# Patient Record
Sex: Male | Born: 1980 | Race: Black or African American | Hispanic: No | Marital: Married | Smoking: Former smoker
Health system: Southern US, Community
[De-identification: ages and names within clinical notes are randomized; demographics above are authoritative.]

## PROBLEM LIST (undated history)

## (undated) DIAGNOSIS — I10 Essential (primary) hypertension: Secondary | ICD-10-CM

## (undated) DIAGNOSIS — E785 Hyperlipidemia, unspecified: Secondary | ICD-10-CM

## (undated) DIAGNOSIS — E119 Type 2 diabetes mellitus without complications: Secondary | ICD-10-CM

## (undated) DIAGNOSIS — K859 Acute pancreatitis without necrosis or infection, unspecified: Secondary | ICD-10-CM

## (undated) HISTORY — PX: EYE SURGERY: SHX253

## (undated) HISTORY — PX: NO PAST SURGERIES: SHX2092

## (undated) HISTORY — DX: Hyperlipidemia, unspecified: E78.5

---

## 2010-06-24 ENCOUNTER — Emergency Department (HOSPITAL_COMMUNITY)
Admission: EM | Admit: 2010-06-24 | Discharge: 2010-06-24 | Disposition: A | Discharge status: Home / Self Care | Payer: Self-pay | Attending: Emergency Medicine | Admitting: Emergency Medicine

## 2010-06-24 DIAGNOSIS — R350 Frequency of micturition: Secondary | ICD-10-CM | POA: Insufficient documentation

## 2010-06-24 DIAGNOSIS — R3589 Other polyuria: Secondary | ICD-10-CM | POA: Insufficient documentation

## 2010-06-24 DIAGNOSIS — R631 Polydipsia: Secondary | ICD-10-CM | POA: Insufficient documentation

## 2010-06-24 DIAGNOSIS — R7309 Other abnormal glucose: Secondary | ICD-10-CM | POA: Insufficient documentation

## 2010-06-24 DIAGNOSIS — R358 Other polyuria: Secondary | ICD-10-CM | POA: Insufficient documentation

## 2010-06-24 LAB — GLUCOSE, CAPILLARY: Glucose-Capillary: 499 mg/dL — ABNORMAL HIGH (ref 70–99)

## 2010-06-24 LAB — POCT I-STAT, CHEM 8
BUN: 13 mg/dL (ref 6–23)
Calcium, Ion: 1.14 mmol/L (ref 1.12–1.32)
Creatinine, Ser: 1 mg/dL (ref 0.4–1.5)
Glucose, Bld: 422 mg/dL — ABNORMAL HIGH (ref 70–99)
Hemoglobin: 16.3 g/dL (ref 13.0–17.0)
Sodium: 134 mEq/L — ABNORMAL LOW (ref 135–145)
TCO2: 28 mmol/L (ref 0–100)

## 2010-11-25 ENCOUNTER — Emergency Department (HOSPITAL_COMMUNITY)
Admission: EM | Admit: 2010-11-25 | Discharge: 2010-11-25 | Disposition: A | Discharge status: Home / Self Care | Payer: Self-pay | Attending: Emergency Medicine | Admitting: Emergency Medicine

## 2010-11-25 DIAGNOSIS — K089 Disorder of teeth and supporting structures, unspecified: Secondary | ICD-10-CM | POA: Insufficient documentation

## 2010-11-25 DIAGNOSIS — K029 Dental caries, unspecified: Secondary | ICD-10-CM | POA: Insufficient documentation

## 2010-11-25 DIAGNOSIS — I10 Essential (primary) hypertension: Secondary | ICD-10-CM | POA: Insufficient documentation

## 2010-11-25 DIAGNOSIS — E1169 Type 2 diabetes mellitus with other specified complication: Secondary | ICD-10-CM | POA: Insufficient documentation

## 2010-11-25 LAB — BASIC METABOLIC PANEL
BUN: 15 mg/dL (ref 6–23)
CO2: 29 mEq/L (ref 19–32)
Calcium: 9.2 mg/dL (ref 8.4–10.5)
Chloride: 95 mEq/L — ABNORMAL LOW (ref 96–112)
Creatinine, Ser: 0.71 mg/dL (ref 0.50–1.35)
Glucose, Bld: 381 mg/dL — ABNORMAL HIGH (ref 70–99)

## 2011-08-22 ENCOUNTER — Encounter (HOSPITAL_COMMUNITY): Payer: Self-pay | Admitting: *Deleted

## 2011-08-22 ENCOUNTER — Emergency Department (INDEPENDENT_AMBULATORY_CARE_PROVIDER_SITE_OTHER)
Admission: EM | Admit: 2011-08-22 | Discharge: 2011-08-22 | Disposition: A | Discharge status: Home / Self Care | Payer: Self-pay | Source: Home / Self Care | Attending: Emergency Medicine | Admitting: Emergency Medicine

## 2011-08-22 DIAGNOSIS — I10 Essential (primary) hypertension: Secondary | ICD-10-CM

## 2011-08-22 DIAGNOSIS — K056 Periodontal disease, unspecified: Secondary | ICD-10-CM

## 2011-08-22 DIAGNOSIS — E119 Type 2 diabetes mellitus without complications: Secondary | ICD-10-CM

## 2011-08-22 DIAGNOSIS — E109 Type 1 diabetes mellitus without complications: Secondary | ICD-10-CM

## 2011-08-22 DIAGNOSIS — K068 Other specified disorders of gingiva and edentulous alveolar ridge: Secondary | ICD-10-CM

## 2011-08-22 HISTORY — DX: Essential (primary) hypertension: I10

## 2011-08-22 MED ORDER — ENALAPRIL MALEATE 20 MG PO TABS: 10.0000 mg | ORAL_TABLET | Freq: Every day | ORAL | Status: DC

## 2011-08-22 MED ORDER — METFORMIN HCL 500 MG PO TABS: 500.0000 mg | ORAL_TABLET | Freq: Two times a day (BID) | ORAL | Status: DC

## 2011-08-22 MED ORDER — CHLORHEXIDINE GLUCONATE 0.12 % MT SOLN: 15.0000 mL | Freq: Two times a day (BID) | OROMUCOSAL | Status: DC

## 2011-08-22 NOTE — Discharge Instructions (Signed)
Dental Care and Dentist Visits Dental care supports good overall health. Regular dental visits can also help you avoid dental pain, bleeding, infection, and other more serious health problems in the future. It is important to keep the mouth healthy because diseases in the teeth, gums, and other oral tissues can spread to other areas of the body. Some problems, such as diabetes, heart disease, and pre-term labor have been associated with poor oral health.  See your dentist every 6 months. If you experience emergency problems such as a toothache or broken tooth, go to the dentist right away. If you see your dentist regularly, you may catch problems early. It is easier to be treated for problems in the early stages.  WHAT TO EXPECT AT A DENTIST VISIT  Your dentist will look for many common oral health problems and recommend proper treatment. At your regular dental visit, you can expect:  Gentle cleaning of the teeth and gums. This includes scraping and polishing. This helps to remove the sticky substance around the teeth and gums (plaque). Plaque forms in the mouth shortly after eating. Over time, plaque hardens on the teeth as tartar. If tartar is not removed regularly, it can cause problems. Cleaning also helps remove stains.   Periodic X-rays. These pictures of the teeth and supporting bone will help your dentist assess the health of your teeth.   Periodic fluoride treatments. Fluoride is a natural mineral shown to help strengthen teeth. Fluoride treatmentinvolves applying a fluoride gel or varnish to the teeth. It is most commonly done in children.   Examination of the mouth, tongue, jaws, teeth, and gums to look for any oral health problems, such as:   Cavities (dental caries). This is decay on the tooth caused by plaque, sugar, and acid in the mouth. It is best to catch a cavity when it is small.   Inflammation of the gums caused by plaque buildup (gingivitis).   Problems with the mouth or  malformed or misaligned teeth.   Oral cancer or other diseases of the soft tissues or jaws.  KEEP YOUR TEETH AND GUMS HEALTHY For healthy teeth and gums, follow these general guidelines as well as your dentist's specific advice:  Have your teeth professionally cleaned at the dentist every 6 months.   Brush twice daily with a fluoride toothpaste.   Floss your teeth daily.   Ask your dentist if you need fluoride supplements, treatments, or fluoride toothpaste.   Eat a healthy diet. Reduce foods and drinks with added sugar.   Avoid smoking.  TREATMENT FOR ORAL HEALTH PROBLEMS If you have oral health problems, treatment varies depending on the conditions present in your teeth and gums.  Your caregiver will most likely recommend good oral hygiene at each visit.   For cavities, gingivitis, or other oral health disease, your caregiver will perform a procedure to treat the problem. This is typically done at a separate appointment. Sometimes your caregiver will refer you to another dental specialist for specific tooth problems or for surgery.  SEEK IMMEDIATE DENTAL CARE IF:  You have pain, bleeding, or soreness in the gum, tooth, jaw, or mouth area.   A permanent tooth becomes loose or separated from the gum socket.   You experience a blow or injury to the mouth or jaw area.  Document Released: 01/09/2011 Document Revised: 04/18/2011 Document Reviewed: 01/09/2011 [facility] Patient Information 2012 [place], [place].Blood Sugar Monitoring, Adult GLUCOSE METERS FOR SELF-MONITORING OF BLOOD GLUCOSE  It is important to be able to correctly  measure your blood sugar (glucose). You can use a blood glucose monitor (a small battery-operated device) to check your glucose level at any time. This allows you and your caregiver to monitor your diabetes and to determine how well your treatment plan is working. The process of monitoring your blood glucose with a glucose meter is called self-monitoring of  blood glucose (SMBG). When people with diabetes control their blood sugar, they have better health. To test for glucose with a typical glucose meter, place the disposable strip in the meter. Then place a small sample of blood on the "test strip." The test strip is coated with chemicals that combine with glucose in blood. The meter measures how much glucose is present. The meter displays the glucose level as a number. Several new models can record and store a number of test results. Some models can connect to personal computers to store test results or print them out.  Newer meters are often easier to use than older models. Some meters allow you to get blood from places other than your fingertip. Some new models have automatic timing, error codes, signals, or barcode readers to help with proper adjustment (calibration). Some meters have a large display screen or spoken instructions for people with visual impairments.  INSTRUCTIONS FOR USING GLUCOSE METERS  Wash your hands with soap and warm water, or clean the area with alcohol. Dry your hands completely.   Prick the side of your fingertip with a lancet (a sharp-pointed tool used by hand).   Hold the hand down and gently milk the finger until a small drop of blood appears. Catch the blood with the test strip.   Follow the instructions for inserting the test strip and using the SMBG meter. Most meters require the meter to be turned on and the test strip to be inserted before applying the blood sample.   Record the test result.   Read the instructions carefully for both the meter and the test strips that go with it. Meter instructions are found in the user manual. Keep this manual to help you solve any problems that may arise. Many meters use "error codes" when there is a problem with the meter, the test strip, or the blood sample on the strip. You will need the manual to understand these error codes and fix the problem.   New devices are available  such as laser lancets and meters that can test blood taken from "alternative sites" of the body, other than fingertips. However, you should use standard fingertip testing if your glucose changes rapidly. Also, use standard testing if:   You have eaten, exercised, or taken insulin in the past 2 hours.   You think your glucose is low.   You tend to not feel symptoms of low blood glucose (hypoglycemia).   You are ill or under stress.   Clean the meter as directed by the manufacturer.   Test the meter for accuracy as directed by the manufacturer.   Take your meter with you to your caregiver's office. This way, you can test your glucose in front of your caregiver to make sure you are using the meter correctly. Your caregiver can also take a sample of blood to test using a routine lab method. If values on the glucose meter are close to the lab results, you and your caregiver will see that your meter is working well and you are using good technique. Your caregiver will advise you about what to do if the results do  not match.  FREQUENCY OF TESTING  Your caregiver will tell you how often you should check your blood glucose. This will depend on your type of diabetes, your current level of diabetes control, and your types of medicines. The following are general guidelines, but your care plan may be different. Record all your readings and the time of day you took them for review with your caregiver.   Diabetes type 1.   When you are using insulin with good diabetic control (either multiple daily injections or via a pump), you should check your glucose 4 times a day.   If your diabetes is not well controlled, you may need to monitor more frequently, including before meals and 2 hours after meals, at bedtime, and occasionally between 2 a.m. and 3 a.m.   You should always check your glucose before a dose of insulin or before changing the rate on your insulin pump.   Diabetes type 2.   Guidelines for  SMBG in diabetes type 2 are not as well defined.   If you are on insulin, follow the guidelines above.   If you are on medicines, but not insulin, and your glucose is not well controlled, you should test at least twice daily.   If you are not on insulin, and your diabetes is controlled with medicines or diet alone, you should test at least once daily, usually before breakfast.   A weekly profile will help your caregiver advise you on your care plan. The week before your visit, check your glucose before a meal and 2 hours after a meal at least daily. You may want to test before and after a different meal each day so you and your caregiver can tell how well controlled your blood sugars are throughout the course of a 24 hour period.   Gestational diabetes (diabetes during pregnancy).   Frequent testing is often necessary. Accurate timing is important.   If you are not on insulin, check your glucose 4 times a day. Check it before breakfast and 1 hour after the start of each meal.   If you are on insulin, check your glucose 6 times a day. Check it before each meal and 1 hour after the first bite of each meal.   General guidelines.   More frequent testing is required at the start of insulin treatment. Your caregiver will instruct you.   Test your glucose any time you suspect you have low blood sugar (hypoglycemia).   You should test more often when you change medicines, when you have unusual stress or illness, or in other unusual circumstances.  OTHER THINGS TO KNOW ABOUT GLUCOSE METERS  Measurement Range. Most glucose meters are able to read glucose levels over a broad range of values from as low as 0 to as high as 600 mg/dL. If you get an extremely high or low reading from your meter, you should first confirm it with another reading. Report very high or very low readings to your caregiver.   Whole Blood Glucose versus Plasma Glucose. Some older home glucose meters measure glucose in your  whole blood. In a lab or when using some newer home glucose meters, the glucose is measured in your plasma (one component of blood). The difference can be important. It is important for you and your caregiver to know whether your meter gives its results as "whole blood equivalent" or "plasma equivalent."   Display of High and Low Glucose Values. Part of learning how to operate a meter is  understanding what the meter results mean. Know how high and low glucose concentrations are displayed on your meter.   Factors that Affect Glucose Meter Performance. The accuracy of your test results depends on many factors and varies depending on the brand and type of meter. These factors include:   Low red blood cell count (anemia).   Substances in your blood (such as uric acid, vitamin C, and others).   Environmental factors (temperature, humidity, altitude).   Name-brand versus generic test strips.   Calibration. Make sure your meter is set up properly. It is a good idea to do a calibration test with a control solution recommended by the manufacturer of your meter whenever you begin using a fresh bottle of test strips. This will help verify the accuracy of your meter.   Improperly stored, expired, or defective test strips. Keep your strips in a dry place with the lid on.   Soiled meter.   Inadequate blood sample.  NEW TECHNOLOGIES FOR GLUCOSE TESTING Alternative site testing Some glucose meters allow testing blood from alternative sites. These include the:  Upper arm.   Forearm.   Base of the thumb.   Thigh.  Sampling blood from alternative sites may be desirable. However, it may have some limitations. Blood in the fingertips show changes in glucose levels more quickly than blood in other parts of the body. This means that alternative site test results may be different from fingertip test results, not because of the meter's ability to test accurately, but because the actual glucose concentration  can be different.  Continuous Glucose Monitoring Devices to measure your blood glucose continuously are available, and others are in development. These methods can be more expensive than self-monitoring with a glucose meter. However, it is uncertain how effective and reliable these devices are. Your caregiver will advise you if this approach makes sense for you. IF BLOOD SUGARS ARE CONTROLLED, PEOPLE WITH DIABETES REMAIN HEALTHIER.  SMBG is an important part of the treatment plan of patients with diabetes mellitus. Below are reasons for using SMBG:   It confirms that your glucose is at a specific, healthy level.   It detects hypoglycemia and severe hyperglycemia.   It allows you and your caregiver to make adjustments in response to changes in lifestyle for individuals requiring medicine.   It determines the need for starting insulin therapy in temporary diabetes that happens during pregnancy (gestational diabetes).  Document Released: 05/02/2003 Document Revised: 04/18/2011 Document Reviewed: 08/23/2010 [facility] Patient Information 2012 [place], [place].Arterial Hypertension Arterial hypertension (high blood pressure) is a condition of elevated pressure in your blood vessels. Hypertension over a long period of time is a risk factor for strokes, heart attacks, and heart failure. It is also the leading cause of kidney (renal) failure.  CAUSES   In Adults -- Over 90% of all hypertension has no known cause. This is called essential or primary hypertension. In the other 10% of people with hypertension, the increase in blood pressure is caused by another disorder. This is called secondary hypertension. Important causes of secondary hypertension are:   Heavy alcohol use.   Obstructive sleep apnea.   Hyperaldosterosim (Conn's syndrome).   Steroid use.   Chronic kidney failure.   Hyperparathyroidism.   Medications.   Renal artery stenosis.   Pheochromocytoma.   Cushing's disease.    Coarctation of the aorta.   Scleroderma renal crisis.   Licorice (in excessive amounts).   Drugs (cocaine, methamphetamine).  Your caregiver can explain any items above that apply  to you.  In Children -- Secondary hypertension is more common and should always be considered.   Pregnancy -- Few women of childbearing age have high blood pressure. However, up to 10% of them develop hypertension of pregnancy. Generally, this will not harm the woman. It may be a sign of 3 complications of pregnancy: preeclampsia, HELLP syndrome, and eclampsia. Follow up and control with medication is necessary.  SYMPTOMS   This condition normally does not produce any noticeable symptoms. It is usually found during a routine exam.   Malignant hypertension is a late problem of high blood pressure. It may have the following symptoms:   Headaches.   Blurred vision.   End-organ damage (this means your kidneys, heart, lungs, and other organs are being damaged).   Stressful situations can increase the blood pressure. If a person with normal blood pressure has their blood pressure go up while being seen by their caregiver, this is often termed "white coat hypertension." Its importance is not known. It may be related with eventually developing hypertension or complications of hypertension.   Hypertension is often confused with mental tension, stress, and anxiety.  DIAGNOSIS  The diagnosis is made by 3 separate blood pressure measurements. They are taken at least 1 week apart from each other. If there is organ damage from hypertension, the diagnosis may be made without repeat measurements. Hypertension is usually identified by having blood pressure readings:  Above 140/90 mmHg measured in both arms, at 3 separate times, over a couple weeks.   Over 130/80 mmHg should be considered a risk factor and may require treatment in patients with diabetes.  Blood pressure readings over 120/80 mmHg are called  "pre-hypertension" even in non-diabetic patients. To get a true blood pressure measurement, use the following guidelines. Be aware of the factors that can alter blood pressure readings.  Take measurements at least 1 hour after caffeine.   Take measurements 30 minutes after smoking and without any stress. This is another reason to quit smoking - it raises your blood pressure.   Use a proper cuff size. Ask your caregiver if you are not sure about your cuff size.   Most home blood pressure cuffs are automatic. They will measure systolic and diastolic pressures. The systolic pressure is the pressure reading at the start of sounds. Diastolic pressure is the pressure at which the sounds disappear. If you are elderly, measure pressures in multiple postures. Try sitting, lying or standing.   Sit at rest for a minimum of 5 minutes before taking measurements.   You should not be on any medications like decongestants. These are found in many cold medications.   Record your blood pressure readings and review them with your caregiver.  If you have hypertension:  Your caregiver may do tests to be sure you do not have secondary hypertension (see "causes" above).   Your caregiver may also look for signs of metabolic syndrome. This is also called Syndrome X or Insulin Resistance Syndrome. You may have this syndrome if you have type 2 diabetes, abdominal obesity, and abnormal blood lipids in addition to hypertension.   Your caregiver will take your medical and family history and perform a physical exam.   Diagnostic tests may include blood tests (for glucose, cholesterol, potassium, and kidney function), a urinalysis, or an EKG. Other tests may also be necessary depending on your condition.  PREVENTION  There are important lifestyle issues that you can adopt to reduce your chance of developing hypertension:  Maintain a normal  weight.   Limit the amount of salt (sodium) in your diet.   Exercise often.     Limit alcohol intake.   Get enough potassium in your diet. Discuss specific advice with your caregiver.   Follow a DASH diet (dietary approaches to stop hypertension). This diet is rich in fruits, vegetables, and low-fat dairy products, and avoids certain fats.  PROGNOSIS  Essential hypertension cannot be cured. Lifestyle changes and medical treatment can lower blood pressure and reduce complications. The prognosis of secondary hypertension depends on the underlying cause. Many people whose hypertension is controlled with medicine or lifestyle changes can live a normal, healthy life.  RISKS AND COMPLICATIONS  While high blood pressure alone is not an illness, it often requires treatment due to its short- and long-term effects on many organs. Hypertension increases your risk for:  CVAs or strokes (cerebrovascular accident).   Heart failure due to chronically high blood pressure (hypertensive cardiomyopathy).   Heart attack (myocardial infarction).   Damage to the retina (hypertensive retinopathy).   Kidney failure (hypertensive nephropathy).  Your caregiver can explain list items above that apply to you. Treatment of hypertension can significantly reduce the risk of complications. TREATMENT   For overweight patients, weight loss and regular exercise are recommended. Physical fitness lowers blood pressure.   Mild hypertension is usually treated with diet and exercise. A diet rich in fruits and vegetables, fat-free dairy products, and foods low in fat and salt (sodium) can help lower blood pressure. Decreasing salt intake decreases blood pressure in a 1/3 of people.   Stop smoking if you are a smoker.  The steps above are highly effective in reducing blood pressure. While these actions are easy to suggest, they are difficult to achieve. Most patients with moderate or severe hypertension end up requiring medications to bring their blood pressure down to a normal level. There are several  classes of medications for treatment. Blood pressure pills (antihypertensives) will lower blood pressure by their different actions. Lowering the blood pressure by 10 mmHg may decrease the risk of complications by as much as 25%. The goal of treatment is effective blood pressure control. This will reduce your risk for complications. Your caregiver will help you determine the best treatment for you according to your lifestyle. What is excellent treatment for one person, may not be for you. HOME CARE INSTRUCTIONS   Do not smoke.   Follow the lifestyle changes outlined in the "Prevention" section.   If you are on medications, follow the directions carefully. Blood pressure medications must be taken as prescribed. Skipping doses reduces their benefit. It also puts you at risk for problems.   Follow up with your caregiver, as directed.   If you are asked to monitor your blood pressure at home, follow the guidelines in the "Diagnosis" section above.  SEEK MEDICAL CARE IF:   You think you are having medication side effects.   You have recurrent headaches or lightheadedness.   You have swelling in your ankles.   You have trouble with your vision.  SEEK IMMEDIATE MEDICAL CARE IF:   You have sudden onset of chest pain or pressure, difficulty breathing, or other symptoms of a heart attack.   You have a severe headache.   You have symptoms of a stroke (such as sudden weakness, difficulty speaking, difficulty walking).  MAKE SURE YOU:   Understand these instructions.   Will watch your condition.   Will get help right away if you are not doing well or get  worse.  Document Released: 04/29/2005 Document Revised: 04/18/2011 Document Reviewed: 11/27/2006 [facility] Patient Information 2012 [place], [place].

## 2011-08-22 NOTE — ED Notes (Addendum)
C/O intermittent upper and lower pains to gum area x 3-4 months with worsening over pas 2 wks.  Has been taking Tylenol.  Also reports need medicine for diabetes and HTN (has been out of all meds x several months); does not check CBGs at home.  Does not have PCP - states he has to come [facility] for refills due to no insurance.  Is supposed to be taking metformin 500mg  bid and ?atenolol.

## 2011-08-22 NOTE — ED Provider Notes (Signed)
History     CSN: [id number]  Arrival date & time 08/22/11  1717   First MD Initiated Contact with Patient 08/22/11 1719      Chief Complaint  Patient presents with  . Dental Pain  . Hyperglycemia  . Hypertension    (Consider location/radiation/quality/duration/timing/severity/associated sxs/prior treatment) HPI Comments: Patient presents urgent care tonight complaining of ongoing upper and lower pains on gum area been happening for 2-3 months but during the last 2 weeks have felt it had gotten progressively worse been taking some Tylenol for. Patient also describes that he's been off medicines for several months for both his high blood pressure and diabetes. Have come back to obtain more refills as he has no primary care doctor is suggested as he states that he has no insurance.  Patient is a 31 y.o. male presenting with tooth pain and hypertension. The history is provided by the patient and a relative.  Dental PainThe primary symptoms include mouth pain and oral lesions. Primary symptoms do not include dental injury, headaches or fever. The symptoms began more than 1 week ago. The symptoms are unchanged. The symptoms occur constantly.  Additional symptoms include: gum swelling, jaw pain, trouble swallowing and dry mouth. Additional symptoms do not include: facial swelling, pain with swallowing, excessive salivation, taste disturbance, ear pain and hearing loss. Medical issues include: periodontal disease.   Hypertension Pertinent negatives include no abdominal pain and no headaches.    Past Medical History  Diagnosis Date  . Diabetes mellitus   . Hypertension     History reviewed. No pertinent past surgical history.  History reviewed. No pertinent family history.  History  Substance Use Topics  . Smoking status: Current Everyday Smoker  . Smokeless tobacco: Not on file  . Alcohol Use:       Review of Systems  Constitutional: Negative for fever, chills and appetite  change.  HENT: Positive for trouble swallowing. Negative for hearing loss, ear pain and facial swelling.   Gastrointestinal: Negative for abdominal pain and rectal pain.  Skin: Negative for rash.  Neurological: Negative for dizziness and headaches.    Allergies  Review of patient's allergies indicates no known allergies.  Home Medications   Current Outpatient Rx  Name Route Sig Dispense Refill  . CHLORHEXIDINE GLUCONATE 0.12 % MT SOLN Mouth/Throat Use as directed 15 mLs in the mouth or throat 2 (two) times daily. X 1 week 120 mL 0  . ENALAPRIL MALEATE 20 MG PO TABS Oral Take 0.5 tablets (10 mg total) by mouth daily. 30 tablet 2  . METFORMIN HCL 500 MG PO TABS Oral Take 1 tablet (500 mg total) by mouth 2 (two) times daily with a meal. 60 tablet 1    BP 149/100  Pulse 74  Temp(Src) 99.1 F (37.3 C) (Oral)  Resp 18  SpO2 100%  Physical Exam  Nursing note and vitals reviewed. Constitutional: He appears well-developed.  HENT:  Head: No trismus in the jaw.  Mouth/Throat: Mucous membranes are dry. He does not have dentures. Abnormal dentition. Dental caries present. No dental abscesses or uvula swelling. No oropharyngeal exudate.    Eyes: Pupils are equal, round, and reactive to light.  Neck: Normal range of motion.    ED Course  Procedures (including critical care time)  Labs Reviewed  GLUCOSE, CAPILLARY - Abnormal; Notable for the following:    Glucose-Capillary 330 (*)    All other components within normal limits   No results found.   1. Diabetes mellitus  2. Gingival erythema   3. Hypertension       MDM  Patient presents here with ongoing dental (more gum discomfort and marked gingival disease) also requesting medicines for his blood pressure and diabetes. Patient was instructed in extent about the importance of blood pressure and diabetes controlled with a primary care Dr. instructions were given to establish with continuity of care and interview process  explained eligibility to obtain orange card. Diet modifications discussed, initiation of antihyperglycemic and antihypertensive medicine were discussed. Potential side effects of some of his medicines were also discussed.        Jimmie Molly, MD 08/22/11 [phone]

## 2011-08-29 ENCOUNTER — Encounter (HOSPITAL_COMMUNITY): Payer: Self-pay | Admitting: Emergency Medicine

## 2011-08-29 ENCOUNTER — Emergency Department (HOSPITAL_COMMUNITY)
Admission: EM | Admit: 2011-08-29 | Discharge: 2011-08-29 | Disposition: A | Discharge status: Home / Self Care | Payer: Self-pay | Attending: Emergency Medicine | Admitting: Emergency Medicine

## 2011-08-29 DIAGNOSIS — K089 Disorder of teeth and supporting structures, unspecified: Secondary | ICD-10-CM | POA: Insufficient documentation

## 2011-08-29 DIAGNOSIS — E119 Type 2 diabetes mellitus without complications: Secondary | ICD-10-CM | POA: Insufficient documentation

## 2011-08-29 DIAGNOSIS — K0889 Other specified disorders of teeth and supporting structures: Secondary | ICD-10-CM

## 2011-08-29 DIAGNOSIS — I10 Essential (primary) hypertension: Secondary | ICD-10-CM | POA: Insufficient documentation

## 2011-08-29 LAB — GLUCOSE, CAPILLARY: Glucose-Capillary: 287 mg/dL — ABNORMAL HIGH (ref 70–99)

## 2011-08-29 MED ORDER — LISINOPRIL 10 MG PO TABS
10.0000 mg | ORAL_TABLET | Freq: Once | ORAL | Status: AC
  Administered 2011-08-29: 10 mg via ORAL
  Filled 2011-08-29: qty 1

## 2011-08-29 MED ORDER — OXYCODONE-ACETAMINOPHEN 5-325 MG PO TABS: 2.0000 | ORAL_TABLET | ORAL | Status: AC | PRN

## 2011-08-29 MED ORDER — ONDANSETRON 4 MG PO TBDP
ORAL_TABLET | ORAL | Status: AC
  Administered 2011-08-29: 4 mg
  Filled 2011-08-29: qty 1

## 2011-08-29 MED ORDER — PENICILLIN V POTASSIUM 500 MG PO TABS: 500.0000 mg | ORAL_TABLET | Freq: Four times a day (QID) | ORAL | Status: AC

## 2011-08-29 MED ORDER — OXYCODONE-ACETAMINOPHEN 5-325 MG PO TABS
2.0000 | ORAL_TABLET | Freq: Once | ORAL | Status: AC
  Administered 2011-08-29: 2 via ORAL
  Filled 2011-08-29: qty 2

## 2011-08-29 NOTE — ED Provider Notes (Signed)
History     CSN: [id number]  Arrival date & time 08/29/11  0249   First MD Initiated Contact with Patient 08/29/11 [phone]      Chief Complaint  Patient presents with  . Dental Pain    (Consider location/radiation/quality/duration/timing/severity/associated sxs/prior treatment) HPI Comments: Patient reports pain in his upper and lower right molars for the past 3 days is unrelieved by Tylenol. He has a history of diabetes and hypertension and is noncompliant with his medications. He denies any difficulty breathing or swallowing. He denies any fevers, nausea, vomiting or chest pain or shortness of breath. Denies any trauma to his teeth. He was seen in urgent care last week for similar issue.  The history is provided by the patient.    Past Medical History  Diagnosis Date  . Diabetes mellitus   . Hypertension     No past surgical history on file.  No family history on file.  History  Substance Use Topics  . Smoking status: Current Everyday Smoker  . Smokeless tobacco: Not on file  . Alcohol Use: No      Review of Systems  Constitutional: Negative for fever, activity change and appetite change.  HENT: Positive for dental problem. Negative for congestion, rhinorrhea and neck pain.   Respiratory: Negative for cough, chest tightness and shortness of breath.   Cardiovascular: Negative for chest pain.  Gastrointestinal: Negative for nausea, vomiting and abdominal pain.  Genitourinary: Negative for dysuria and hematuria.  Skin: Negative for rash.  Neurological: Negative for headaches.    Allergies  Review of patient's allergies indicates no known allergies.  Home Medications   Current Outpatient Rx  Name Route Sig Dispense Refill  . ACETAMINOPHEN 500 MG PO TABS Oral Take 2,000 mg by mouth every 6 (six) hours as needed. For pain    . ENALAPRIL MALEATE 20 MG PO TABS Oral Take 0.5 tablets (10 mg total) by mouth daily. 30 tablet 2  . IBUPROFEN 200 MG PO TABS Oral Take 800 mg  by mouth every 6 (six) hours as needed. For pain    . METFORMIN HCL 500 MG PO TABS Oral Take 1 tablet (500 mg total) by mouth 2 (two) times daily with a meal. 60 tablet 1    BP 156/101  Pulse 75  Temp(Src) 98 F (36.7 C) (Oral)  Resp 19  SpO2 100%  Physical Exam  Constitutional: He is oriented to person, place, and time. He appears well-developed and well-nourished. No distress.  HENT:  Head: Normocephalic and atraumatic.  Mouth/Throat: Oropharynx is clear and moist. No oropharyngeal exudate.    Eyes: Conjunctivae and EOM are normal. Pupils are equal, round, and reactive to light.  Neck: Normal range of motion. Neck supple.  Cardiovascular: Normal rate, regular rhythm and normal heart sounds.   No murmur heard. Pulmonary/Chest: Effort normal and breath sounds normal. No respiratory distress.  Abdominal: Soft. There is no tenderness. There is no rebound and no guarding.  Musculoskeletal: Normal range of motion. He exhibits no edema and no tenderness.  Neurological: He is alert and oriented to person, place, and time. No cranial nerve deficit.  Skin: Skin is warm.    ED Course  Procedures (including critical care time)  Labs Reviewed  GLUCOSE, CAPILLARY - Abnormal; Notable for the following:    Glucose-Capillary 287 (*)    All other components within normal limits   No results found.   No diagnosis found.    MDM  Dental pain, hypertension, hyperglycemia. No evidence of abscess  or Ludwig angina.  Blood pressure improved and reassessment. Followup with dentistry. Take diabetes and hypertension medicines as prescribed. Resource guide given.       Glynn Octave, MD 08/29/11 [phone]

## 2011-08-29 NOTE — ED Notes (Signed)
Pt. Reports upper /lower molar pain radiating to right face for several days unrelieved by otc pain medications.

## 2011-08-29 NOTE — ED Notes (Signed)
CBG Result = 287

## 2011-08-29 NOTE — Discharge Instructions (Signed)
Arterial Hypertension Arterial hypertension (high blood pressure) is a condition of elevated pressure in your blood vessels. Hypertension over a long period of time is a risk factor for strokes, heart attacks, and heart failure. It is also the leading cause of kidney (renal) failure.  CAUSES   In Adults -- Over 90% of all hypertension has no known cause. This is called essential or primary hypertension. In the other 10% of people with hypertension, the increase in blood pressure is caused by another disorder. This is called secondary hypertension. Important causes of secondary hypertension are:   Heavy alcohol use.   Obstructive sleep apnea.   Hyperaldosterosim (Conn's syndrome).   Steroid use.   Chronic kidney failure.   Hyperparathyroidism.   Medications.   Renal artery stenosis.   Pheochromocytoma.   Cushing's disease.   Coarctation of the aorta.   Scleroderma renal crisis.   Licorice (in excessive amounts).   Drugs (cocaine, methamphetamine).  Your caregiver can explain any items above that apply to you.  In Children -- Secondary hypertension is more common and should always be considered.   Pregnancy -- Few women of childbearing age have high blood pressure. However, up to 10% of them develop hypertension of pregnancy. Generally, this will not harm the woman. It Even be a sign of 3 complications of pregnancy: preeclampsia, HELLP syndrome, and eclampsia. Follow up and control with medication is necessary.  SYMPTOMS   This condition normally does not produce any noticeable symptoms. It is usually found during a routine exam.   Malignant hypertension is a late problem of high blood pressure. It Monger have the following symptoms:   Headaches.   Blurred vision.   End-organ damage (this means your kidneys, heart, lungs, and other organs are being damaged).   Stressful situations can increase the blood pressure. If a person with normal blood pressure has their blood  pressure go up while being seen by their caregiver, this is often termed "white coat hypertension." Its importance is not known. It Alkire be related with eventually developing hypertension or complications of hypertension.   Hypertension is often confused with mental tension, stress, and anxiety.  DIAGNOSIS  The diagnosis is made by 3 separate blood pressure measurements. They are taken at least 1 week apart from each other. If there is organ damage from hypertension, the diagnosis Lemire be made without repeat measurements. Hypertension is usually identified by having blood pressure readings:  Above 140/90 mmHg measured in both arms, at 3 separate times, over a couple weeks.   Over 130/80 mmHg should be considered a risk factor and Grisanti require treatment in patients with diabetes.  Blood pressure readings over 120/80 mmHg are called "pre-hypertension" even in non-diabetic patients. To get a true blood pressure measurement, use the following guidelines. Be aware of the factors that can alter blood pressure readings.  Take measurements at least 1 hour after caffeine.   Take measurements 30 minutes after smoking and without any stress. This is another reason to quit smoking - it raises your blood pressure.   Use a proper cuff size. Ask your caregiver if you are not sure about your cuff size.   Most home blood pressure cuffs are automatic. They will measure systolic and diastolic pressures. The systolic pressure is the pressure reading at the start of sounds. Diastolic pressure is the pressure at which the sounds disappear. If you are elderly, measure pressures in multiple postures. Try sitting, lying or standing.   Sit at rest for a minimum of   5 minutes before taking measurements.   You should not be on any medications like decongestants. These are found in many cold medications.   Record your blood pressure readings and review them with your caregiver.  If you have hypertension:  Your caregiver  may do tests to be sure you do not have secondary hypertension (see "causes" above).   Your caregiver may also look for signs of metabolic syndrome. This is also called Syndrome X or Insulin Resistance Syndrome. You may have this syndrome if you have type 2 diabetes, abdominal obesity, and abnormal blood lipids in addition to hypertension.   Your caregiver will take your medical and family history and perform a physical exam.   Diagnostic tests may include blood tests (for glucose, cholesterol, potassium, and kidney function), a urinalysis, or an EKG. Other tests may also be necessary depending on your condition.  PREVENTION  There are important lifestyle issues that you can adopt to reduce your chance of developing hypertension:  Maintain a normal weight.   Limit the amount of salt (sodium) in your diet.   Exercise often.   Limit alcohol intake.   Get enough potassium in your diet. Discuss specific advice with your caregiver.   Follow a DASH diet (dietary approaches to stop hypertension). This diet is rich in fruits, vegetables, and low-fat dairy products, and avoids certain fats.  PROGNOSIS  Essential hypertension cannot be cured. Lifestyle changes and medical treatment can lower blood pressure and reduce complications. The prognosis of secondary hypertension depends on the underlying cause. Many people whose hypertension is controlled with medicine or lifestyle changes can live a normal, healthy life.  RISKS AND COMPLICATIONS  While high blood pressure alone is not an illness, it often requires treatment due to its short- and long-term effects on many organs. Hypertension increases your risk for:  CVAs or strokes (cerebrovascular accident).   Heart failure due to chronically high blood pressure (hypertensive cardiomyopathy).   Heart attack (myocardial infarction).   Damage to the retina (hypertensive retinopathy).   Kidney failure (hypertensive nephropathy).  Your caregiver can  explain list items above that apply to you. Treatment of hypertension can significantly reduce the risk of complications. TREATMENT   For overweight patients, weight loss and regular exercise are recommended. Physical fitness lowers blood pressure.   Mild hypertension is usually treated with diet and exercise. A diet rich in fruits and vegetables, fat-free dairy products, and foods low in fat and salt (sodium) can help lower blood pressure. Decreasing salt intake decreases blood pressure in a 1/3 of people.   Stop smoking if you are a smoker.  The steps above are highly effective in reducing blood pressure. While these actions are easy to suggest, they are difficult to achieve. Most patients with moderate or severe hypertension end up requiring medications to bring their blood pressure down to a normal level. There are several classes of medications for treatment. Blood pressure pills (antihypertensives) will lower blood pressure by their different actions. Lowering the blood pressure by 10 mmHg may decrease the risk of complications by as much as 25%. The goal of treatment is effective blood pressure control. This will reduce your risk for complications. Your caregiver will help you determine the best treatment for you according to your lifestyle. What is excellent treatment for one person, may not be for you. HOME CARE INSTRUCTIONS   Do not smoke.   Follow the lifestyle changes outlined in the "Prevention" section.   If you are on medications, follow the directions   carefully. Blood pressure medications must be taken as prescribed. Skipping doses reduces their benefit. It also puts you at risk for problems.   Follow up with your caregiver, as directed.   If you are asked to monitor your blood pressure at home, follow the guidelines in the "Diagnosis" section above.  SEEK MEDICAL CARE IF:   You think you are having medication side effects.   You have recurrent headaches or lightheadedness.     You have swelling in your ankles.   You have trouble with your vision.  SEEK IMMEDIATE MEDICAL CARE IF:   You have sudden onset of chest pain or pressure, difficulty breathing, or other symptoms of a heart attack.   You have a severe headache.   You have symptoms of a stroke (such as sudden weakness, difficulty speaking, difficulty walking).  MAKE SURE YOU:   Understand these instructions.   Will watch your condition.   Will get help right away if you are not doing well or get worse.  Document Released: 04/29/2005 Document Revised: 04/18/2011 Document Reviewed: 11/27/2006 [facility] Patient Information 2012 [place], [place].Dental Pain A tooth ache may be caused by cavities (tooth decay). Cavities expose the nerve of the tooth to air and hot or cold temperatures. It may come from an infection or abscess (also called a boil or furuncle) around your tooth. It is also often caused by dental caries (tooth decay). This causes the pain you are having. DIAGNOSIS  Your caregiver can diagnose this problem by exam. TREATMENT   If caused by an infection, it may be treated with medications which kill germs (antibiotics) and pain medications as prescribed by your caregiver. Take medications as directed.   Only take over-the-counter or prescription medicines for pain, discomfort, or fever as directed by your caregiver.   Whether the tooth ache today is caused by infection or dental disease, you should see your dentist as soon as possible for further care.  SEEK MEDICAL CARE IF: The exam and treatment you received today has been provided on an emergency basis only. This is not a substitute for complete medical or dental care. If your problem worsens or new problems (symptoms) appear, and you are unable to meet with your dentist, call or return to this location. SEEK IMMEDIATE MEDICAL CARE IF:   You have a fever.   You develop redness and swelling of your face, jaw, or neck.   You are unable to  open your mouth.   You have severe pain uncontrolled by pain medicine.  MAKE SURE YOU:   Understand these instructions.   Will watch your condition.   Will get help right away if you are not doing well or get worse.  Document Released: 04/29/2005 Document Revised: 04/18/2011 Document Reviewed: 12/16/2007 ExitCare Patient Information  RESOURCE GUIDE  Dental Problems  Patients with Medicaid: [facility] [phone]                                           [phone] [address] [employer] Phone:  [phone]  Phone:  [phone]  If unable to pay or uninsured, contact:  [facility] or [facility]. to become qualified for the adult dental clinic.  Chronic Pain Problems Contact [facility]  [phone] Patients need to be referred by their primary care doctor.  Insufficient Money for Medicine Contact [employer]:  call "211" or [facility] [phone].  No Primary Care Doctor Call [facility]  [phone] Other agencies that provide inexpensive medical care    [facility]  [phone]    [facility]  [phone]    [facility]  [phone]    [facility]  [phone]    Planned Parenthood  [phone]    [facility]  [phone]  Psychological Services [facility]  [phone] [facility] Services  [phone] [facility]   6616362700 (emergency services [phone])  Substance Abuse Resources [facility]  [phone] [facility] [phone] [facility] [place] [phone] Floydene Flock [phone] Residential & Outpatient Substance Abuse Program  [phone]  Abuse/Neglect [facility] Child Abuse Hotline 9134461021 [facility] Child Abuse Hotline [phone] (After Hours)  Emergency Shelter [facility] 8194198908  M[facility] [phone] [facility] Alert Services [phone]  MRSA Hotline #:   (574) 328-7382    [facility] Resources  [facility]     [employer]                          [facility] Dept. [address] [place]                       [address]      [address]  Blondell Reveal Phone:  [phone]                                   Phone:  [phone]                 Phone:  [phone]  [facility] Phone:  [phone]  [facility] Child Abuse Hotline 609 301 [phone] (After Hours)  [address], [place].

## 2011-12-23 ENCOUNTER — Encounter (HOSPITAL_COMMUNITY): Payer: Self-pay

## 2011-12-23 ENCOUNTER — Emergency Department (INDEPENDENT_AMBULATORY_CARE_PROVIDER_SITE_OTHER)
Admission: EM | Admit: 2011-12-23 | Discharge: 2011-12-23 | Disposition: A | Discharge status: Home / Self Care | Payer: Self-pay | Source: Home / Self Care | Attending: Emergency Medicine | Admitting: Emergency Medicine

## 2011-12-23 DIAGNOSIS — IMO0002 Reserved for concepts with insufficient information to code with codable children: Secondary | ICD-10-CM

## 2011-12-23 DIAGNOSIS — I10 Essential (primary) hypertension: Secondary | ICD-10-CM

## 2011-12-23 DIAGNOSIS — E1165 Type 2 diabetes mellitus with hyperglycemia: Secondary | ICD-10-CM

## 2011-12-23 LAB — POCT I-STAT, CHEM 8
Chloride: 98 mEq/L (ref 96–112)
Creatinine, Ser: 0.8 mg/dL (ref 0.50–1.35)
Glucose, Bld: 314 mg/dL — ABNORMAL HIGH (ref 70–99)
HCT: 43 % (ref 39.0–52.0)
Hemoglobin: 14.6 g/dL (ref 13.0–17.0)
Potassium: 4 mEq/L (ref 3.5–5.1)
Sodium: 137 mEq/L (ref 135–145)

## 2011-12-23 LAB — POCT URINALYSIS DIP (DEVICE)
Bilirubin Urine: NEGATIVE
Glucose, UA: 500 mg/dL — AB
Specific Gravity, Urine: 1.015 (ref 1.005–1.030)
Urobilinogen, UA: 1 mg/dL (ref 0.0–1.0)

## 2011-12-23 MED ORDER — METHOCARBAMOL 500 MG PO TABS: 500.0000 mg | ORAL_TABLET | Freq: Three times a day (TID) | ORAL | Status: AC

## 2011-12-23 MED ORDER — ENALAPRIL MALEATE 20 MG PO TABS: 20.0000 mg | ORAL_TABLET | Freq: Every day | ORAL | Status: DC

## 2011-12-23 MED ORDER — METFORMIN HCL 850 MG PO TABS: 850.0000 mg | ORAL_TABLET | Freq: Two times a day (BID) | ORAL | Status: DC

## 2011-12-23 MED ORDER — HYDROCHLOROTHIAZIDE 25 MG PO TABS: 25.0000 mg | ORAL_TABLET | Freq: Every day | ORAL | Status: DC

## 2011-12-23 MED ORDER — GLIMEPIRIDE 4 MG PO TABS: 4.0000 mg | ORAL_TABLET | Freq: Every day | ORAL | Status: DC

## 2011-12-23 NOTE — ED Provider Notes (Signed)
Chief Complaint  Patient presents with  . Hyperglycemia    History of Present Illness:  Randy [name] is a 31 year old male who was diagnosed with type 2 diabetes about a year ago. He has been on metformin 500 mg twice a day. This initially caused some nausea and vomiting, but then the symptoms went away. Right now he is tolerating it well. He denies any diarrhea or abdominal pain. His blood sugars had been under good control, but more recently have been elevated in the 240-360 range. He denies any polyuria, polydipsia, or blurry vision. He has had some dry mouth. He has also had some abdominal pain and lower back pain but denies any nausea or vomiting. He has had no shortness of breath, chest pain, pressure, tightness, or dizziness. He denies any extremity pain, paresthesias, swelling, or ulcerations. He has no history of hyperlipidemia, heart disease, or kidney disease. He does have high blood pressure and is on enalapril 20 mg per day. He denies any medication side effects. He needs some refills on his medications since he is just about to run out. He's also concerned that his blood sugars haven't been very well controlled with his current regimen. He does not have a primary care physician yet, but like to find one. He also complains today of some lower back pain without radiation which occurred after moving some music equipment.  Review of Systems:  Other than noted above, the patient denies any of the following symptoms. Systemic:  No fever, chills, fatigue, weight loss or gain. Eye:  No blurred vision or diplopia. Lungs:  No cough, wheezing, or shortness of breath. Heart:  No chest pain, tightness, pressure, palpitation, dizziness, syncope, or edema. Abdomen:  No abdominal pain, nausea, vomiting or diarrrhea. GU:  No dysuria, frequency, urgency, hematuria. Ext:  No pain, paresthesias, swelling, or ulcerations. Endocrine:  No polyuria, polydipsia, heat or cold intolerance. Skin:  No rash or  itching. Neuro:  No focal weakness or numbness.   PMFSH:  Past medical history, family history, social history, meds, and allergies were reviewed.  Physical Exam:   Vital signs:  BP 153/95  Pulse 70  Temp 98.7 F (37.1 C) (Oral)  Resp 20  SpO2 98% Gen:  Alert, oriented, in no distress. Eye:  PERRL, full EOM, lids conjunctivas, and sclera unremarkable. ENT:  TMs and canals normal.  Mucous membranes moist.  No acetone odor.  Pharynx clear.   Neck:  Supple, full ROM, no adenopathy or tenderness.  No JVD. Lungs:  Clear to auscultation.  No wheezes, rales or rhonchi. Heart:  Regular rhythm.  No gallops or murmers. Abdomen:  Soft, flat, non-distended, nontener.  No hepato-splenomegaly or mass.  Bowel sounds normal.  No pulsatile midline mass or bruit. Ext:  No edema, pulses full.  No ulceration or skin lesions. Skin:  Clear, warm and dry.  No rash or lesions. Neuro:  Alert and oriented times 3.  No focal weakness.  Speech normal.  CNs intact.  Labs:   Results for orders placed during the hospital encounter of 12/23/11  POCT URINALYSIS DIP (DEVICE)      Component Value Range   Glucose, UA 500 (*) NEGATIVE mg/dL   Bilirubin Urine NEGATIVE  NEGATIVE   Ketones, ur NEGATIVE  NEGATIVE mg/dL   Specific Gravity, Urine 1.015  1.005 - 1.030   Hgb urine dipstick TRACE (*) NEGATIVE   pH 7.0  5.0 - 8.0   Protein, ur NEGATIVE  NEGATIVE mg/dL   Urobilinogen, UA 1.0  0.0 -  1.0 mg/dL   Nitrite NEGATIVE  NEGATIVE   Leukocytes, UA NEGATIVE  NEGATIVE  POCT I-STAT, CHEM 8      Component Value Range   Sodium 137  135 - 145 mEq/L   Potassium 4.0  3.5 - 5.1 mEq/L   Chloride 98  96 - 112 mEq/L   BUN 10  6 - 23 mg/dL   Creatinine, Ser 1.61  0.50 - 1.35 mg/dL   Glucose, Bld 096 (*) 70 - 99 mg/dL   Calcium, Ion 0.45 (*) 1.12 - 1.23 mmol/L   TCO2 25  0 - 100 mmol/L   Hemoglobin 14.6  13.0 - 17.0 g/dL   HCT 40.9  81.1 - 91.4 %     Assessment:  The primary encounter diagnosis was Diabetes type 2,  uncontrolled. A diagnosis of Essential hypertension was also pertinent to this visit. His diabetes is not well controlled, so I will increase his metformin to 850 mg per day and add glimepiride 4 mg per day. I suggested he try to find a primary care physician within the next month and followup, in the meantime, checking his blood sugars daily. He can always come back here for followup if we can find anywhere else to go. Likewise his blood pressure wasn't quite at goal, so I added hydrochlorothiazide 25 mg per day. He was given some Robaxin for lower back pain.  Plan:   1.  The following meds were prescribed:   New Prescriptions   ENALAPRIL (VASOTEC) 20 MG TABLET    Take 1 tablet (20 mg total) by mouth daily.   GLIMEPIRIDE (AMARYL) 4 MG TABLET    Take 1 tablet (4 mg total) by mouth daily before breakfast.   HYDROCHLOROTHIAZIDE (HYDRODIURIL) 25 MG TABLET    Take 1 tablet (25 mg total) by mouth daily.   METFORMIN (GLUCOPHAGE) 850 MG TABLET    Take 1 tablet (850 mg total) by mouth 2 (two) times daily with a meal.   METHOCARBAMOL (ROBAXIN) 500 MG TABLET    Take 1 tablet (500 mg total) by mouth 3 (three) times daily.   2.  The patient was instructed in symptomatic care and handouts were given. 3.  The patient was told to return if becoming worse in any way, if no better in 3 or 4 days, and given some red flag symptoms that would indicate earlier return.    Reuben Likes, MD 12/23/11 [phone]

## 2011-12-23 NOTE — ED Notes (Signed)
State he has  Been taking his Rx as directed, but his BP and CBG have been high ; pain in low, mid back , had been moving music equipment

## 2012-04-15 ENCOUNTER — Emergency Department (INDEPENDENT_AMBULATORY_CARE_PROVIDER_SITE_OTHER)
Admission: EM | Admit: 2012-04-15 | Discharge: 2012-04-15 | Disposition: A | Discharge status: Home / Self Care | Payer: Self-pay | Source: Home / Self Care | Attending: Family Medicine | Admitting: Family Medicine

## 2012-04-15 ENCOUNTER — Encounter (HOSPITAL_COMMUNITY): Payer: Self-pay | Admitting: *Deleted

## 2012-04-15 DIAGNOSIS — R7309 Other abnormal glucose: Secondary | ICD-10-CM

## 2012-04-15 DIAGNOSIS — R739 Hyperglycemia, unspecified: Secondary | ICD-10-CM

## 2012-04-15 LAB — POCT I-STAT, CHEM 8
BUN: 7 mg/dL (ref 6–23)
Calcium, Ion: 1.21 mmol/L (ref 1.12–1.23)
Chloride: 101 mEq/L (ref 96–112)
Glucose, Bld: 193 mg/dL — ABNORMAL HIGH (ref 70–99)
HCT: 42 % (ref 39.0–52.0)
Potassium: 4 mEq/L (ref 3.5–5.1)

## 2012-04-15 MED ORDER — ENALAPRIL MALEATE 20 MG PO TABS: 10.0000 mg | ORAL_TABLET | Freq: Every day | ORAL | Status: DC

## 2012-04-15 MED ORDER — GLIMEPIRIDE 4 MG PO TABS: 4.0000 mg | ORAL_TABLET | Freq: Every day | ORAL | Status: DC

## 2012-04-15 MED ORDER — HYDROCHLOROTHIAZIDE 25 MG PO TABS: 25.0000 mg | ORAL_TABLET | Freq: Every day | ORAL | Status: DC

## 2012-04-15 MED ORDER — METFORMIN HCL 850 MG PO TABS: 850.0000 mg | ORAL_TABLET | Freq: Two times a day (BID) | ORAL | Status: DC

## 2012-04-15 NOTE — ED Notes (Signed)
Ran out of his diabetes medicine 2 mos. ago.   Has been checking his sugars daily and they have been running 250.  Today is was 310.  No blurred vision. He does have frequent urination.

## 2012-04-15 NOTE — ED Provider Notes (Signed)
History     CSN: [id number]  Arrival date & time 04/15/12  1726   First MD Initiated Contact with Patient 04/15/12 1832      Chief Complaint  Patient presents with  . Hyperglycemia    (Consider location/radiation/quality/duration/timing/severity/associated sxs/prior treatment) Patient is a 31 y.o. male presenting with diabetes problem. The history is provided by the patient. No language interpreter was used.  Diabetes He has type 2 diabetes mellitus. Associated symptoms include polyuria. Pertinent negatives for diabetes include no blurred vision, no chest pain and no fatigue.  Pt reports he is out of his diabetes medication and his blood pressure medications. Patient does not currently have a regular physician. He is supposed to take 2 medicines for his diabetes and a blood pressure medicine. Patient does not recall what medicines he is supposed to take. Reviewing patient's past records it appears he is on metformin 850 twice a day hydrochlorothiazide 25 mg once a day Amaryl 4 mg once a day and Vasotec 20 mg half tablet once a day.   Past Medical History  Diagnosis Date  . Diabetes mellitus   . Hypertension     History reviewed. No pertinent past surgical history.  Family History  Problem Relation Age of Onset  . Hypertension Mother   . Diabetes Father   . Hypertension Father     History  Substance Use Topics  . Smoking status: Former Smoker    Types: Cigarettes    Quit date: 12/26/2011  . Smokeless tobacco: Not on file  . Alcohol Use: No      Review of Systems  Constitutional: Negative for fatigue.  Eyes: Negative for blurred vision.  Cardiovascular: Negative for chest pain.  Genitourinary: Positive for polyuria.  All other systems reviewed and are negative.    Allergies  Review of patient's allergies indicates no known allergies.  Home Medications   Current Outpatient Rx  Name  Route  Sig  Dispense  Refill  . ACETAMINOPHEN 500 MG PO TABS   Oral   Take  2,000 mg by mouth every 6 (six) hours as needed. For pain         . ENALAPRIL MALEATE 20 MG PO TABS   Oral   Take 0.5 tablets (10 mg total) by mouth daily.   30 tablet   2   . ENALAPRIL MALEATE 20 MG PO TABS   Oral   Take 1 tablet (20 mg total) by mouth daily.   20 tablet   2   . GLIMEPIRIDE 4 MG PO TABS   Oral   Take 1 tablet (4 mg total) by mouth daily before breakfast.   30 tablet   2   . HYDROCHLOROTHIAZIDE 25 MG PO TABS   Oral   Take 1 tablet (25 mg total) by mouth daily.   30 tablet   2   . IBUPROFEN 200 MG PO TABS   Oral   Take 800 mg by mouth every 6 (six) hours as needed. For pain         . METFORMIN HCL 500 MG PO TABS   Oral   Take 1 tablet (500 mg total) by mouth 2 (two) times daily with a meal.   60 tablet   1   . METFORMIN HCL 850 MG PO TABS   Oral   Take 1 tablet (850 mg total) by mouth 2 (two) times daily with a meal.   60 tablet   2     BP 123/86  Pulse 68  Temp 99.3 F (37.4 C) (Oral)  Resp 20  SpO2 99%  Physical Exam  Nursing note and vitals reviewed. HENT:  Head: Normocephalic.  Eyes: Conjunctivae normal and EOM are normal. Pupils are equal, round, and reactive to light.       Normal fundus bilat  Neck: Normal range of motion. Neck supple.  Cardiovascular: Normal rate.   Pulmonary/Chest: Effort normal and breath sounds normal.  Abdominal: Soft. Bowel sounds are normal.  Musculoskeletal: Normal range of motion.  Neurological: He is alert.  Skin: Skin is warm.    ED Course  Procedures (including critical care time)  Labs Reviewed  POCT I-STAT, CHEM 8 - Abnormal; Notable for the following:    Glucose, Bld 193 (*)     All other components within normal limits   No results found.   No diagnosis found.    MDM  Pt given prescriptions with 3 month refill.   Pt given primary care referrals.   I stat 8 shows glucose of 193,  co2 is [address]        Lonia Skinner [place], [place] 04/15/12 1911

## 2012-04-18 NOTE — ED Provider Notes (Signed)
Medical screening examination/treatment/procedure(s) were performed by resident physician or non-physician practitioner and as supervising physician I was immediately available for consultation/collaboration.   KINDL,JAMES DOUGLAS MD.    James D Kindl, MD 04/18/12 1111 

## 2012-08-04 ENCOUNTER — Encounter (HOSPITAL_COMMUNITY): Payer: Self-pay | Admitting: Emergency Medicine

## 2012-08-04 ENCOUNTER — Emergency Department (HOSPITAL_COMMUNITY)
Admission: EM | Admit: 2012-08-04 | Discharge: 2012-08-04 | Disposition: A | Discharge status: Home / Self Care | Payer: Self-pay | Attending: Emergency Medicine | Admitting: Emergency Medicine

## 2012-08-04 DIAGNOSIS — K3184 Gastroparesis: Secondary | ICD-10-CM | POA: Insufficient documentation

## 2012-08-04 DIAGNOSIS — R300 Dysuria: Secondary | ICD-10-CM | POA: Insufficient documentation

## 2012-08-04 DIAGNOSIS — R358 Other polyuria: Secondary | ICD-10-CM | POA: Insufficient documentation

## 2012-08-04 DIAGNOSIS — R350 Frequency of micturition: Secondary | ICD-10-CM | POA: Insufficient documentation

## 2012-08-04 DIAGNOSIS — R3589 Other polyuria: Secondary | ICD-10-CM | POA: Insufficient documentation

## 2012-08-04 DIAGNOSIS — Z87891 Personal history of nicotine dependence: Secondary | ICD-10-CM | POA: Insufficient documentation

## 2012-08-04 DIAGNOSIS — I10 Essential (primary) hypertension: Secondary | ICD-10-CM | POA: Insufficient documentation

## 2012-08-04 DIAGNOSIS — E1143 Type 2 diabetes mellitus with diabetic autonomic (poly)neuropathy: Secondary | ICD-10-CM

## 2012-08-04 DIAGNOSIS — R631 Polydipsia: Secondary | ICD-10-CM | POA: Insufficient documentation

## 2012-08-04 DIAGNOSIS — Z79899 Other long term (current) drug therapy: Secondary | ICD-10-CM | POA: Insufficient documentation

## 2012-08-04 DIAGNOSIS — E1149 Type 2 diabetes mellitus with other diabetic neurological complication: Secondary | ICD-10-CM | POA: Insufficient documentation

## 2012-08-04 LAB — URINALYSIS, ROUTINE W REFLEX MICROSCOPIC
Glucose, UA: 100 mg/dL — AB
Hgb urine dipstick: NEGATIVE
Protein, ur: NEGATIVE mg/dL
Specific Gravity, Urine: 1.026 (ref 1.005–1.030)

## 2012-08-04 LAB — COMPREHENSIVE METABOLIC PANEL
AST: 26 U/L (ref 0–37)
Albumin: 3.7 g/dL (ref 3.5–5.2)
Chloride: 95 mEq/L — ABNORMAL LOW (ref 96–112)
Creatinine, Ser: 0.73 mg/dL (ref 0.50–1.35)
Sodium: 133 mEq/L — ABNORMAL LOW (ref 135–145)
Total Bilirubin: 0.3 mg/dL (ref 0.3–1.2)

## 2012-08-04 LAB — CBC WITH DIFFERENTIAL/PLATELET
Basophils Absolute: 0.1 10*3/uL (ref 0.0–0.1)
Basophils Relative: 1 % (ref 0–1)
HCT: 39.6 % (ref 39.0–52.0)
MCHC: 34.1 g/dL (ref 30.0–36.0)
Monocytes Absolute: 0.5 10*3/uL (ref 0.1–1.0)
Neutro Abs: 2.7 10*3/uL (ref 1.7–7.7)
Neutrophils Relative %: 42 % — ABNORMAL LOW (ref 43–77)
Platelets: 241 10*3/uL (ref 150–400)
RDW: 12.3 % (ref 11.5–15.5)

## 2012-08-04 MED ORDER — METOCLOPRAMIDE HCL 10 MG PO TABS
10.0000 mg | ORAL_TABLET | ORAL | Status: AC
  Administered 2012-08-04: 10 mg via ORAL
  Filled 2012-08-04: qty 1

## 2012-08-04 MED ORDER — METOCLOPRAMIDE HCL 10 MG PO TABS: 10.0000 mg | ORAL_TABLET | Freq: Four times a day (QID) | ORAL | Status: DC

## 2012-08-04 MED ORDER — POTASSIUM CHLORIDE CRYS ER 20 MEQ PO TBCR: 40.0000 meq | EXTENDED_RELEASE_TABLET | Freq: Once | ORAL | Status: DC

## 2012-08-04 NOTE — ED Provider Notes (Signed)
I saw  the patient, reviewed the resident's note and I agree with the findings and plan.   .Face to face Exam:  General:  Awake HEENT:  Atraumatic Resp:  Normal effort Abd:  Nondistended Neuro:No focal weakness   Nelia Shi, MD 08/04/12 2242

## 2012-08-04 NOTE — ED Provider Notes (Signed)
History     CSN: [id number]  Arrival date & time 08/04/12  2043   First MD Initiated Contact with Patient 08/04/12 2128      Chief Complaint  Patient presents with  . Emesis    (Consider location/radiation/quality/duration/timing/severity/associated sxs/prior treatment) Patient is a 32 y.o. male presenting with vomiting.  Emesis Associated symptoms: no abdominal pain, no chills, no diarrhea and no myalgias    Randy [name] 32yo M pmh of HTN and DM type 2 uncontrolled presents for ongoing emesis. Pt states that has not had any fever, chills or abdominal pain. Emesis is associated with large meals and inconsistent eating habits. Pt doesn't have any anorexia or recent weight loss. Pt does have some polyuria and polydipsia recently that has been ongoing for past year. Pt has had fragmented care in terms of DM management given no medical insurance. No blurry vision. Wife describes some white film around the head of his penis when pt doesn't take medications. Pt currently not having any discharge or penile pain/edema.    Past Medical History  Diagnosis Date  . Diabetes mellitus   . Hypertension     History reviewed. No pertinent past surgical history.  Family History  Problem Relation Age of Onset  . Hypertension Mother   . Diabetes Father   . Hypertension Father     History  Substance Use Topics  . Smoking status: Former Smoker    Types: Cigarettes    Quit date: 12/26/2011  . Smokeless tobacco: Not on file  . Alcohol Use: No    Review of Systems  Constitutional: Negative for fever, chills, diaphoresis, appetite change, fatigue and unexpected weight change.  Respiratory: Negative for cough and shortness of breath.   Cardiovascular: Negative for chest pain and leg swelling.  Gastrointestinal: Positive for vomiting. Negative for nausea, abdominal pain, diarrhea and constipation.  Genitourinary: Positive for dysuria and frequency. Negative for urgency, discharge, penile swelling,  penile pain and testicular pain.  Musculoskeletal: Negative for myalgias.  Neurological: Negative for dizziness.    Allergies  Review of patient's allergies indicates no known allergies.  Home Medications   Current Outpatient Rx  Name  Route  Sig  Dispense  Refill  . enalapril (VASOTEC) 20 MG tablet   Oral   Take 10 mg by mouth daily.         Marland Kitchen ibuprofen (ADVIL,MOTRIN) 200 MG tablet   Oral   Take 400 mg by mouth every 6 (six) hours as needed for pain. For pain         . metFORMIN (GLUCOPHAGE) 850 MG tablet   Oral   Take 850 mg by mouth 2 (two) times daily with a meal.           BP 143/101  Pulse 81  Temp(Src) 98.7 F (37.1 C) (Oral)  Resp 18  SpO2 97%  Physical Exam  Constitutional: He is oriented to person, place, and time. He appears well-developed and well-nourished. No distress.  Eyes: Conjunctivae are normal. Pupils are equal, round, and reactive to light.  Cardiovascular: Normal rate, regular rhythm and normal heart sounds.   No murmur heard. Pulmonary/Chest: Effort normal and breath sounds normal. No respiratory distress. He has no wheezes. He has no rales.  Abdominal: Soft. He exhibits no distension and no mass. There is no tenderness. There is no rebound and no guarding.  Genitourinary: Penis normal. No penile tenderness.  Neurological: He is alert and oriented to person, place, and time.  Skin: Skin is warm. He  is not diaphoretic.    ED Course  Procedures (including critical care time)  Labs Reviewed  CBC WITH DIFFERENTIAL - Abnormal; Notable for the following:    Neutrophils Relative 42 (*)    Lymphocytes Relative 48 (*)    All other components within normal limits  COMPREHENSIVE METABOLIC PANEL - Abnormal; Notable for the following:    Sodium 133 (*)    Chloride 95 (*)    Glucose, Bld 189 (*)    All other components within normal limits  URINALYSIS, ROUTINE W REFLEX MICROSCOPIC - Abnormal; Notable for the following:    Glucose, UA 100 (*)     Ketones, ur 15 (*)    All other components within normal limits  URINE CULTURE   No results found.   No diagnosis found.    MDM  Pt needs PCP care for DM management. Pt has limited insight into disease process. Pt AG of 8 and Glucose of 189. UA negative and no repeat emesis or nausea during ED stay. Pt was started on Reglan and given resources to establish PCP which will help pt understand and control DM.   Pt was seen and discussed with Dr. Linna Darner, MD 08/04/12 2237

## 2012-08-04 NOTE — ED Notes (Signed)
Pt reports ongoing nausea for "weeks." States he is a diabetic, and feels like "when my sugar goes up, that's when I start to throw up." Pt doesn't check BG at home though. Pt reports dehydration and dizziness.

## 2012-08-04 NOTE — ED Notes (Signed)
ARRIVED WITH EMS FROM HOME  REPORTS NAUSEA AND VOMITTING FOR SEVERAL DAYS WITH GENERALIZED WEAKNESS , CBG= 165 , ALERT AND OREINTED AT ARRIVAL.

## 2012-08-05 LAB — URINE CULTURE
Colony Count: NO GROWTH
Culture: NO GROWTH

## 2013-02-01 ENCOUNTER — Encounter (HOSPITAL_COMMUNITY): Payer: Self-pay | Admitting: *Deleted

## 2013-02-01 ENCOUNTER — Emergency Department (HOSPITAL_COMMUNITY)
Admission: EM | Admit: 2013-02-01 | Discharge: 2013-02-01 | Disposition: A | Discharge status: Home / Self Care | Payer: Self-pay | Attending: Emergency Medicine | Admitting: Emergency Medicine

## 2013-02-01 DIAGNOSIS — R51 Headache: Secondary | ICD-10-CM | POA: Insufficient documentation

## 2013-02-01 DIAGNOSIS — R739 Hyperglycemia, unspecified: Secondary | ICD-10-CM

## 2013-02-01 DIAGNOSIS — Z87891 Personal history of nicotine dependence: Secondary | ICD-10-CM | POA: Insufficient documentation

## 2013-02-01 DIAGNOSIS — I10 Essential (primary) hypertension: Secondary | ICD-10-CM | POA: Insufficient documentation

## 2013-02-01 DIAGNOSIS — H5710 Ocular pain, unspecified eye: Secondary | ICD-10-CM | POA: Insufficient documentation

## 2013-02-01 DIAGNOSIS — R112 Nausea with vomiting, unspecified: Secondary | ICD-10-CM | POA: Insufficient documentation

## 2013-02-01 DIAGNOSIS — Z79899 Other long term (current) drug therapy: Secondary | ICD-10-CM | POA: Insufficient documentation

## 2013-02-01 DIAGNOSIS — F411 Generalized anxiety disorder: Secondary | ICD-10-CM | POA: Insufficient documentation

## 2013-02-01 DIAGNOSIS — E119 Type 2 diabetes mellitus without complications: Secondary | ICD-10-CM | POA: Insufficient documentation

## 2013-02-01 LAB — CBC WITH DIFFERENTIAL/PLATELET
Eosinophils Relative: 1 % (ref 0–5)
HCT: 40.9 % (ref 39.0–52.0)
Lymphocytes Relative: 42 % (ref 12–46)
Lymphs Abs: 2.6 10*3/uL (ref 0.7–4.0)
MCV: 80.8 fL (ref 78.0–100.0)
Monocytes Absolute: 0.4 10*3/uL (ref 0.1–1.0)
RBC: 5.06 MIL/uL (ref 4.22–5.81)
WBC: 6.1 10*3/uL (ref 4.0–10.5)

## 2013-02-01 LAB — COMPREHENSIVE METABOLIC PANEL
Albumin: 4.2 g/dL (ref 3.5–5.2)
BUN: 8 mg/dL (ref 6–23)
Calcium: 9.6 mg/dL (ref 8.4–10.5)
Creatinine, Ser: 0.63 mg/dL (ref 0.50–1.35)
Total Protein: 8.6 g/dL — ABNORMAL HIGH (ref 6.0–8.3)

## 2013-02-01 LAB — URINE MICROSCOPIC-ADD ON

## 2013-02-01 LAB — URINALYSIS, ROUTINE W REFLEX MICROSCOPIC
Glucose, UA: >1000 mg/dL — AB
Ketones, ur: 15 mg/dL — AB
Leukocytes, UA: NEGATIVE
Specific Gravity, Urine: 1.041 — ABNORMAL HIGH (ref 1.005–1.030)
pH: 7 (ref 5.0–8.0)

## 2013-02-01 LAB — GLUCOSE, CAPILLARY

## 2013-02-01 MED ORDER — HYDROMORPHONE HCL PF 1 MG/ML IJ SOLN: 1.0000 mg | Freq: Once | INTRAMUSCULAR | Status: DC

## 2013-02-01 MED ORDER — HYDROMORPHONE HCL PF 1 MG/ML IJ SOLN
0.5000 mg | Freq: Once | INTRAMUSCULAR | Status: AC
  Administered 2013-02-01: 0.5 mg via INTRAVENOUS
  Filled 2013-02-01: qty 1

## 2013-02-01 MED ORDER — ONDANSETRON HCL 4 MG/2ML IJ SOLN
4.0000 mg | Freq: Once | INTRAMUSCULAR | Status: AC
  Administered 2013-02-01: 4 mg via INTRAVENOUS
  Filled 2013-02-01: qty 2

## 2013-02-01 MED ORDER — SODIUM CHLORIDE 0.9 % IV BOLUS (SEPSIS)
1000.0000 mL | Freq: Once | INTRAVENOUS | Status: AC
  Administered 2013-02-01: 1000 mL via INTRAVENOUS

## 2013-02-01 MED ORDER — PROMETHAZINE HCL 25 MG PO TABS: 25.0000 mg | ORAL_TABLET | Freq: Four times a day (QID) | ORAL | Status: DC | PRN

## 2013-02-01 MED ORDER — SODIUM CHLORIDE 0.9 % IV SOLN
INTRAVENOUS | Status: DC
  Administered 2013-02-01: 12:00:00 via INTRAVENOUS

## 2013-02-01 NOTE — ED Provider Notes (Signed)
  Medical screening examination/treatment/procedure(s) were performed by non-physician practitioner and as supervising physician I was immediately available for consultation/collaboration.    [name], MD 02/01/13 1605 

## 2013-02-01 NOTE — Discharge Instructions (Signed)
Nausea and Vomiting Nausea is a sick feeling that often comes before throwing up (vomiting). Vomiting is a reflex where stomach contents come out of your mouth. Vomiting can cause severe loss of body fluids (dehydration). Children and elderly adults can become dehydrated quickly, especially if they also have diarrhea. Nausea and vomiting are symptoms of a condition or disease. It is important to find the cause of your symptoms. CAUSES   Direct irritation of the stomach lining. This irritation can result from increased acid production (gastroesophageal reflux disease), infection, food poisoning, taking certain medicines (such as nonsteroidal anti-inflammatory drugs), alcohol use, or tobacco use.  Signals from the brain.These signals could be caused by a headache, heat exposure, an inner ear disturbance, increased pressure in the brain from injury, infection, a tumor, or a concussion, pain, emotional stimulus, or metabolic problems.  An obstruction in the gastrointestinal tract (bowel obstruction).  Illnesses such as diabetes, hepatitis, gallbladder problems, appendicitis, kidney problems, cancer, sepsis, atypical symptoms of a heart attack, or eating disorders.  Medical treatments such as chemotherapy and radiation.  Receiving medicine that makes you sleep (general anesthetic) during surgery. DIAGNOSIS Your caregiver may ask for tests to be done if the problems do not improve after a few days. Tests may also be done if symptoms are severe or if the reason for the nausea and vomiting is not clear. Tests may include:  Urine tests.  Blood tests.  Stool tests.  Cultures (to look for evidence of infection).  X-rays or other imaging studies. Test results can help your caregiver make decisions about treatment or the need for additional tests. TREATMENT You need to stay well hydrated. Drink frequently but in small amounts.You may wish to drink water, sports drinks, clear broth, or eat frozen  ice pops or gelatin dessert to help stay hydrated.When you eat, eating slowly may help prevent nausea.There are also some antinausea medicines that may help prevent nausea. HOME CARE INSTRUCTIONS   Take all medicine as directed by your caregiver.  If you do not have an appetite, do not force yourself to eat. However, you must continue to drink fluids.  If you have an appetite, eat a normal diet unless your caregiver tells you differently.  Eat a variety of complex carbohydrates (rice, wheat, potatoes, bread), lean meats, yogurt, fruits, and vegetables.  Avoid high-fat foods because they are more difficult to digest.  Drink enough water and fluids to keep your urine clear or pale yellow.  If you are dehydrated, ask your caregiver for specific rehydration instructions. Signs of dehydration may include:  Severe thirst.  Dry lips and mouth.  Dizziness.  Dark urine.  Decreasing urine frequency and amount.  Confusion.  Rapid breathing or pulse. SEEK IMMEDIATE MEDICAL CARE IF:   You have blood or brown flecks (like coffee grounds) in your vomit.  You have black or bloody stools.  You have a severe headache or stiff neck.  You are confused.  You have severe abdominal pain.  You have chest pain or trouble breathing.  You do not urinate at least once every 8 hours.  You develop cold or clammy skin.  You continue to vomit for longer than 24 to 48 hours.  You have a fever. MAKE SURE YOU:   Understand these instructions.  Will watch your condition.  Will get help right away if you are not doing well or get worse. Document Released: 04/29/2005 Document Revised: 07/22/2011 Document Reviewed: 09/26/2010 [facility] Patient Information 2014 [place], [place].   RESOURCE GUIDE  Chronic Pain Problems: Contact [facility]  [phone] Patients need to be referred by their primary care doctor.  Insufficient Money for Medicine: Contact United Way:  call  "211" or [facility] 4308346801.  No Primary Care Doctor: Call [facility]  [phone] - can help you locate a primary care doctor that  accepts your insurance, provides certain services, etc. Physician Referral Service- [phone]  Agencies that provide inexpensive medical care: [facility]  [phone] [facility]  [phone] [facility]  [phone] [facility]  [phone] Planned Parenthood  [phone] [facility]  [phone]  Medicaid-accepting [facility] Providers: [facility]- [address]  [phone], Mon-Fri 9am-7pm, Sat 9am-1pm [facility]- [address] [place], [address] [place]  [phone] [facility]- [address] [place]  [phone] [facility]- [address]  [phone] Renaye Rakers- [address] [place], [address], [phone]  Only accepts [place] patients after they have their name  applied to their card  Self Pay (no insurance) in [facility]: Sickle Cell Patients: Dr Willey Blade, [facility]  [address] [place], [phone] [facility]- [address] [place]  [phone]       [facility]- [address]       -     [facility]- see information above (Speak to [employer] if you do not have insurance)       -  [facility]- [address] [place], [phone]       -  [facility]- [address],  [phone]       -  [facility]- [address] [phone]       -  Dr Julio Sicks-  [address], [place], [phone]       -  [facility]- [address] [phone]       -  [facility]- [address], [phone], also [address], [phone]       -    [facility]- [address] [place], [phone], 1st & 3rd Saturday   every month, 10am-1pm  1)  Find a Doctor and Pay Out of Pocket Although you won't have to find out who is covered by your insurance plan, it is a good idea to ask around and get recommendations. You will then need to call the office and see if the doctor you have chosen will accept you as a new patient and what types of options they offer for patients who are self-pay. Some doctors offer discounts or will set up payment plans for their patients who do not have insurance, but you will need to ask so you aren't surprised when you get to your appointment.  2) Contact Your Local Health Department Not all health departments have doctors that can see patients for sick visits, but many do, so it is worth a call to see if yours does. If you don't know where your local health department is, you can check in your phone book. The CDC also has a tool to help you locate your state's health department, and many state websites also have listings of all of their local health departments.  3) Find a Walk-in Clinic If your illness  is not likely to be very severe or complicated, you may want to try a walk in clinic. These are popping up all over the country in pharmacies, drugstores, and shopping centers. They're usually staffed by nurse practitioners or physician assistants that have been trained to treat common illnesses and complaints. They're usually fairly quick and inexpensive. However, if you have serious medical issues or chronic medical problems, these are probably not your best option  STD Testing [facility] Department of [facility], STD Clinic, [address] [place], phone [phone] or [phone].  Monday - Friday, call for an appointment. [facility] Department of [employer], STD Clinic, [place] [address], [place], phone [phone] or [phone].  Monday - Friday, call for an appointment.  Abuse/Neglect: [facility] Child Abuse Hotline [phone] [facility] Child Abuse  Hotline 9731883786 (After Hours)  Emergency Shelter:  [facility] [phone]  Maternity Homes: Room at the [facility] [phone]) [phone] [facility] [phone]  MRSA Hotline #:   (628)801-1639  [facility] Acute Care Resources  [facility]  United Way [facility] Dept. [address]                 [address]         [address] [place] [address]  Blondell Reveal Phone:  [phone]                                  Phone:  [phone]                   Phone:  [phone]  [facility], [phone] [facility]- [phone]       -     [facility] in [place], [address],                                  [phone], [facility] Child Abuse Hotline 970-415-5865 or 505-244-0246 (After Hours)   Behavioral Health Services  Substance Abuse Resources: Alcohol and Drug Services  541-156-6342 [facility] 530-334-8383 [facility] [place] [phone] Floydene Flock (502)596-1319 Residential & Outpatient Substance Abuse Program  (416)072-1914  Psychological Services: [facility] Health  [phone] [facility] Services  [phone] [facility], [phone] [place]. [address] [place], ACCESS LINE: 680-052-3763 or [phone], [email or web address]  Dental Assistance  If unable to pay or uninsured, contact:  [facility] or [facility]. to become qualified for the adult dental clinic.  Patients with Medicaid: [facility] [phone] [address] Qu[address], [phone] [address] [phone]  If unable to pay, or uninsured, contact [facility] [phone]) or [facility] Department [phone] in [place], [phone] in [facility]) to  become qualified for the adult dental clinic  Other Low-Cost Community Dental  Services: [facility]- [address] [place], [place], [address], [phone], [address], 2nd and 4th Thursday of the month at 6:30am.  10 clients each day by appointment, can sometimes see walk-in patients if someone does not show for an appointment. [facility]- [address] Ether Griffins [place], [place], [address], [phone] [facility] [address] [place], [place], [address], [phone] [facility] Health Department- [phone] [facility] Health Department- [phone] [facility] [phone]  Please make every effort to establish with a primary care physician for routine medical care  Adult Health Services  The [facility] Department of Public Health provides a wide range of adult health services. Some of these services are designed to address the healthcare needs of all [facility] residents and all services are designed to meet the needs of uninsured/underinsured low income residents. Some services are available to any resident of [place], call [phone] for details. ] The [facility], a new medical clinic for adults, is now open. For more information about the Center and its services please call [phone]. For information on our Refugee Health services, click here.  For more information on any of the following Department of Public Health programs, including hours of service, click on the highlighted link.  SERVICES FOR WOMEN (Adults and Teens) [employer] provide a full range of birth control options plus education and counseling. New patient visit and annual return visits include a complete examination, pap test as indicated, and other laboratory as indicated. Included is our [employer] for men.  Maternity Care is provided through pregnancy, including a six week post partum exam. Women who meet  eligibility criteria for the Medicaid for Pregnant Women program, receive care free. Other women are charged on a sliding scale according to income. Note: [facility] provides services to pregnant women who have a Medicaid card. Call [phone] for an appointment in [place] or [phone] for an appointment in [facility].  Primary Care for Medicaid [place] Access Women is available through the [facility] of [employer]. As primary care provider for the [facility] Access [facility] Managed Care program, women may designate the [facility] as their primary care provider.  PLEASE CALL [phone] FOR AN APPOINTMENT FOR THE ABOVE SERVICES IN EITHER [place] OR [place]. Information available in [place] and [place].   Childbirth Education Classes are open to the public and offered to help families prepare for the best possible childbirth experience as well as to promote lifelong health and wellness. Classes are offered throughout the year and meet on the same night once a week for five weeks. Medicaid covers the cost of the classes for the mother-to-be and her partner. For participants without Medicaid, the cost of the class series is $45.00 for the mother-to-be and her partner. Class size is limited and registration is required. For more information or to register call [phone]. Baby items donated by [employer] and the [employer] are given away during each class series.  SERVICES FOR WOMEN AND MEN Sexually Transmitted Infection appointments, including HIV testing, are available daily (weekdays, except holidays). Call early as same-day appointments are limited. For an appointment in either [facility] or [place], call [phone]. Services are confidential and free of charge.  Skin Testing for Tuberculosis Please call [phone]. Adult Immunizations are available, usually for a fee. Please call [phone] for details.  PLEASE CALL  [phone] FOR AN APPOINTMENT FOR THE ABOVE SERVICES IN  EITHER [place] OR [place].   [facility] provides up to the minute recommended vaccines for your travel destination. We also provide essential health and political information to help insure a safe and pleasurable travel experience. This program is self-sustaining, however, fees are very competitive. We are a CERTIFIED YELLOW FEVER IMMUNIZATION approved clinic site. PLEASE CALL [phone] FOR AN APPOINTMENT IN EITHER [place] OR [place].   If you have questions about the services listed above, we want to answer them! Email [place] at: [email or web address] Home Visiting Services for elderly and the disabled are available to residents of [facility] who are in need of care that compares to the care offered by a nursing home, have needs that can be met by the program, and have [place] Medicaid. Other short term services are available to residents 18 years and older who are unable to meet requirements for eligibility to receive services from a certified home health agency, spend the majority of time at home, and need care for six months or less.  PLEASE CALL W9791826 OR [phone] FOR MORE INFORMATION. Medication Assistance Program serves as a link between pharmaceutical companies and patients to provide low cost or free prescription medications. This servce is available for residents who meet certain income restrictions and have no insurance coverage.  PLEASE CALL [phone] (Why) OR [phone] ([place]) FOR MORE INFORMATION.  Updated Feb. 21, 2013

## 2013-02-01 NOTE — ED Notes (Signed)
Pt to ED c/o nausea, pressure to L eye that started this am.  BP of 180/114.  CBG 310 in triage.

## 2013-02-01 NOTE — ED Provider Notes (Signed)
CSN: [id number]     Arrival date & time 02/01/13  1044 History   First MD Initiated Contact with Patient 02/01/13 1113     Chief Complaint  Patient presents with  . Hypertension  . Nausea   (Consider location/radiation/quality/duration/timing/severity/associated sxs/prior Treatment) HPI  Randy [name] is a 32 y.o.male with a significant PMH of diabetes, hypertension presents to the ER with complaints of nausea, dry heaving, hyperglycemia, anxiety, headache, hypertension and left eye pain. His significant other says that she got him worked up yesterday morning and since then he has been having intermittent vomiting. Today he woke up with his BP elevated and cyclical dry heaving. He is also complaining of left eye pain that started after the vomiting. At this time the patient is too nauseas and unable to answer my questions between the dry heaving.   Past Medical History  Diagnosis Date  . Diabetes mellitus   . Hypertension    History reviewed. No pertinent past surgical history. Family History  Problem Relation Age of Onset  . Hypertension Mother   . Diabetes Father   . Hypertension Father    History  Substance Use Topics  . Smoking status: Former Smoker    Types: Cigarettes    Quit date: 12/26/2011  . Smokeless tobacco: Not on file  . Alcohol Use: No    Review of Systems The patient denies anorexia, fever, weight loss,, vision loss, decreased hearing, hoarseness, chest pain, syncope, dyspnea on exertion, peripheral edema, balance deficits, hemoptysis, abdominal pain, melena, hematochezia, severe indigestion/heartburn, hematuria, incontinence, genital sores, muscle weakness, suspicious skin lesions, transient blindness, difficulty walking, depression, unusual weight change, abnormal bleeding, enlarged lymph nodes, angioedema, and breast masses.  Allergies  Review of patient's allergies indicates no known allergies.  Home Medications   Current Outpatient Rx  Name  Route  Sig   Dispense  Refill  . enalapril (VASOTEC) 20 MG tablet   Oral   Take 10 mg by mouth daily.         . metFORMIN (GLUCOPHAGE) 850 MG tablet   Oral   Take 850 mg by mouth 2 (two) times daily with a meal.          BP 180/114  Pulse 72  Temp(Src) 97.3 F (36.3 C) (Oral)  Resp 18  SpO2 100% Physical Exam  Nursing note and vitals reviewed. Constitutional: He is oriented to person, place, and time. He appears well-developed and well-nourished. He appears distressed.  HENT:  Head: Normocephalic and atraumatic.  Eyes: Conjunctivae and EOM are normal. Pupils are equal, round, and reactive to light. Right eye exhibits no chemosis, no discharge, no exudate and no hordeolum. Left eye exhibits no chemosis, no discharge, no exudate and no hordeolum.  Neck: Normal range of motion. Neck supple.  Cardiovascular: Normal rate and regular rhythm.   Pulmonary/Chest: Effort normal.  Abdominal: Soft. He exhibits no distension and no ascites. There is no tenderness. There is no CVA tenderness.  Neurological: He is alert and oriented to person, place, and time. He has normal strength. No cranial nerve deficit or sensory deficit. GCS eye subscore is 4. GCS verbal subscore is 5. GCS motor subscore is 6.  Skin: Skin is warm and dry.    ED Course  Procedures (including critical care time) Labs Review Labs Reviewed  GLUCOSE, CAPILLARY - Abnormal; Notable for the following:    Glucose-Capillary 310 (*)    All other components within normal limits  COMPREHENSIVE METABOLIC PANEL  CBC WITH DIFFERENTIAL  URINALYSIS, ROUTINE  W REFLEX MICROSCOPIC   Imaging Review No results found. Dx: nausea and vomiting   MDM  No diagnosis found.   Patient is feeling much better. He was given a liter of fluids, 0.5mg  IV Dilaudid and 4mg  Zofran. His blood pressure is now 136/68 without any intervention. Normal eye exam. Normal Neuro exam.   He is much more calm and feels a lot better. He was observed in the ED for a  period of time, CBG rechecked and is less than 300 now. Pt passed fluid challenge. He thinks metformin makes him sick. I recommend that he f/u with his PCP to discuss medication change.  32 y.o.Randy [name]'s evaluation in the Emergency Department is complete. It has been determined that no acute conditions requiring further emergency intervention are present at this time. The patient/guardian have been advised of the diagnosis and plan. We have discussed signs and symptoms that warrant return to the ED, such as changes or worsening in symptoms.  Vital signs are stable at discharge. Filed Vitals:   02/01/13 1139  BP: 142/89  Pulse: 60  Temp:   Resp: 16    Patient/guardian has voiced understanding and agreed to follow-up with the PCP or specialist.       Dorthula Matas, PA-C 02/01/13 1408

## 2013-02-01 NOTE — ED Notes (Signed)
PA at bedside.

## 2013-02-06 ENCOUNTER — Encounter (HOSPITAL_COMMUNITY): Payer: Self-pay | Admitting: *Deleted

## 2013-02-06 ENCOUNTER — Emergency Department (HOSPITAL_COMMUNITY)
Admission: EM | Admit: 2013-02-06 | Discharge: 2013-02-06 | Disposition: A | Discharge status: Home / Self Care | Payer: Self-pay | Attending: Emergency Medicine | Admitting: Emergency Medicine

## 2013-02-06 DIAGNOSIS — Z79899 Other long term (current) drug therapy: Secondary | ICD-10-CM | POA: Insufficient documentation

## 2013-02-06 DIAGNOSIS — M5380 Other specified dorsopathies, site unspecified: Secondary | ICD-10-CM | POA: Insufficient documentation

## 2013-02-06 DIAGNOSIS — I10 Essential (primary) hypertension: Secondary | ICD-10-CM | POA: Insufficient documentation

## 2013-02-06 DIAGNOSIS — K3184 Gastroparesis: Secondary | ICD-10-CM | POA: Insufficient documentation

## 2013-02-06 DIAGNOSIS — Z87891 Personal history of nicotine dependence: Secondary | ICD-10-CM | POA: Insufficient documentation

## 2013-02-06 DIAGNOSIS — E119 Type 2 diabetes mellitus without complications: Secondary | ICD-10-CM | POA: Insufficient documentation

## 2013-02-06 DIAGNOSIS — M79609 Pain in unspecified limb: Secondary | ICD-10-CM | POA: Insufficient documentation

## 2013-02-06 LAB — URINALYSIS, ROUTINE W REFLEX MICROSCOPIC
Glucose, UA: >1000 mg/dL — AB
Hgb urine dipstick: NEGATIVE
Nitrite: NEGATIVE
Specific Gravity, Urine: 1.044 — ABNORMAL HIGH (ref 1.005–1.030)
pH: 7.5 (ref 5.0–8.0)

## 2013-02-06 LAB — URINE MICROSCOPIC-ADD ON

## 2013-02-06 LAB — POCT I-STAT, CHEM 8
BUN: 9 mg/dL (ref 6–23)
Chloride: 96 mEq/L (ref 96–112)
HCT: 42 % (ref 39.0–52.0)
Potassium: 3.6 mEq/L (ref 3.5–5.1)
Sodium: 136 mEq/L (ref 135–145)

## 2013-02-06 LAB — CK: Total CK: 126 U/L (ref 7–232)

## 2013-02-06 MED ORDER — PROMETHAZINE HCL 25 MG PO TABS: 25.0000 mg | ORAL_TABLET | Freq: Four times a day (QID) | ORAL | Status: DC | PRN

## 2013-02-06 MED ORDER — GLIMEPIRIDE 4 MG PO TABS: 4.0000 mg | ORAL_TABLET | Freq: Every day | ORAL | Status: DC

## 2013-02-06 MED ORDER — OXYCODONE-ACETAMINOPHEN 5-325 MG PO TABS
1.0000 | ORAL_TABLET | Freq: Once | ORAL | Status: AC
  Administered 2013-02-06: 1 via ORAL
  Filled 2013-02-06: qty 1

## 2013-02-06 MED ORDER — ONDANSETRON 4 MG PO TBDP
8.0000 mg | ORAL_TABLET | Freq: Once | ORAL | Status: AC
  Administered 2013-02-06: 8 mg via ORAL
  Filled 2013-02-06: qty 2

## 2013-02-06 MED ORDER — METFORMIN HCL 850 MG PO TABS: 850.0000 mg | ORAL_TABLET | Freq: Two times a day (BID) | ORAL | Status: DC

## 2013-02-06 NOTE — ED Notes (Signed)
CBG reads 265

## 2013-02-06 NOTE — ED Notes (Signed)
Pt reports being seen here on 9/22 for same. Pt is diabetic, has not checked cbg today. Reports feeling like entire body is hot, inside and out. Having pain to back and legs, having n/v.

## 2013-02-06 NOTE — ED Provider Notes (Addendum)
CSN: [id number]     Arrival date & time 02/06/13  1759 History   First MD Initiated Contact with Patient 02/06/13 1907     Chief Complaint  Patient presents with  . Emesis  . Pain   (Consider location/radiation/quality/duration/timing/severity/associated sxs/prior Treatment) Patient is a 32 y.o. male presenting with vomiting. The history is provided by the patient and the spouse.  Emesis Severity:  Moderate Duration:  4 hours Timing:  Constant Number of daily episodes:  Multiple that stopped at 4am Quality:  Stomach contents Able to tolerate:  Liquids and solids Progression:  Resolved Chronicity:  Recurrent Recent urination:  Normal Relieved by:  Ice chips and liquids Ineffective treatments:  None tried Associated symptoms: no abdominal pain, no chills, no diarrhea, no fever, no headaches and no myalgias   Associated symptoms comment:  Back pain starting last night in the lumbar area and in the thighs Risk factors: diabetes     Past Medical History  Diagnosis Date  . Diabetes mellitus   . Hypertension    History reviewed. No pertinent past surgical history. Family History  Problem Relation Age of Onset  . Hypertension Mother   . Diabetes Father   . Hypertension Father    History  Substance Use Topics  . Smoking status: Former Smoker    Types: Cigarettes    Quit date: 12/26/2011  . Smokeless tobacco: Not on file  . Alcohol Use: No    Review of Systems  Constitutional: Negative for chills.  Gastrointestinal: Positive for vomiting. Negative for abdominal pain and diarrhea.  Genitourinary: Negative for dysuria, frequency and flank pain.  Musculoskeletal: Positive for back pain. Negative for myalgias.       No recent trauma or heavy lifting.  No prior hx of back injury  Neurological: Negative for headaches.  All other systems reviewed and are negative.    Allergies  Review of patient's allergies indicates no known allergies.  Home Medications   Current  Outpatient Rx  Name  Route  Sig  Dispense  Refill  . enalapril (VASOTEC) 20 MG tablet   Oral   Take 10 mg by mouth daily.         . metFORMIN (GLUCOPHAGE) 850 MG tablet   Oral   Take 850 mg by mouth 2 (two) times daily with a meal.          BP 141/83  Pulse 77  Temp(Src) 98.7 F (37.1 C) (Oral)  Resp 20  SpO2 99% Physical Exam  Nursing note and vitals reviewed. Constitutional: He is oriented to person, place, and time. He appears well-developed and well-nourished. No distress.  HENT:  Head: Normocephalic and atraumatic.  Mouth/Throat: Oropharynx is clear and moist.  Eyes: Conjunctivae and EOM are normal. Pupils are equal, round, and reactive to light.  Neck: Normal range of motion. Neck supple.  Cardiovascular: Normal rate, regular rhythm and intact distal pulses.   No murmur heard. Pulmonary/Chest: Effort normal and breath sounds normal. No respiratory distress. He has no wheezes. He has no rales.  Abdominal: Soft. He exhibits no distension. There is no tenderness. There is no rebound and no guarding.  Musculoskeletal: Normal range of motion. He exhibits no edema.       Lumbar back: He exhibits tenderness and bony tenderness.       Back:  Mild tenderness to palpation of the bilateral thighs.  No swelling or calf pain.  Normal pulses distally.  Normal strength and sensation in the lower ext.  Normal flexion and  extension of the back  Neurological: He is alert and oriented to person, place, and time.  Skin: Skin is warm and dry. No rash noted. No erythema.  Psychiatric: He has a normal mood and affect. His behavior is normal.    ED Course  Procedures (including critical care time) Labs Review Labs Reviewed  GLUCOSE, CAPILLARY - Abnormal; Notable for the following:    Glucose-Capillary 265 (*)    All other components within normal limits  URINALYSIS, ROUTINE W REFLEX MICROSCOPIC - Abnormal; Notable for the following:    APPearance CLOUDY (*)    Specific Gravity,  Urine 1.044 (*)    Glucose, UA >1000 (*)    Ketones, ur 15 (*)    Leukocytes, UA SMALL (*)    All other components within normal limits  URINE MICROSCOPIC-ADD ON - Abnormal; Notable for the following:    Squamous Epithelial / LPF MANY (*)    Bacteria, UA FEW (*)    All other components within normal limits  POCT I-STAT, CHEM 8 - Abnormal; Notable for the following:    Glucose, Bld 308 (*)    All other components within normal limits  URINE CULTURE  CK   Imaging Review No results found.  MDM   1. Diabetes   2. Gastroparesis     Patient is here with bilateral back spasm and I tenderness and vomiting all night last night which resolved around 3 or 4 AM. He does have a history of diabetes but has yet to see a primary physician. He's been treated at urgent care with adjustments of the Glucophage but when he takes the higher dose it causes him to abdominal cramping and vomiting. He denies any urinary symptoms or respiratory symptoms today. Vital signs are within normal limits and blood sugar currently is 265. He has not taken his metformin today. He'll most likely have muscle tenderness is musculoskeletal however we'll check potassium and sodium as well as CPK to ensure that's not the cause of his pain. Patient given pain control and currently tolerating by mouth's. Will give patient primary care followup if he needs diabetic education and consistent care.  9:16 PM Labs show mild ketones and contaminated urine however pt denies urinary sx.  Will culture but not treat at this time.  CK wnl.  Will d/c home with pain control and diabetic control.  Will also give f/u with wellness center.  Gwyneth Sprout, MD 02/06/13 2117  Gwyneth Sprout, MD 02/06/13 2155

## 2013-02-06 NOTE — ED Notes (Signed)
Vital signs stable. 

## 2013-02-08 LAB — URINE CULTURE

## 2013-04-13 ENCOUNTER — Encounter (HOSPITAL_COMMUNITY): Payer: Self-pay | Admitting: Emergency Medicine

## 2013-04-13 ENCOUNTER — Emergency Department (INDEPENDENT_AMBULATORY_CARE_PROVIDER_SITE_OTHER)
Admission: EM | Admit: 2013-04-13 | Discharge: 2013-04-13 | Disposition: A | Discharge status: Home / Self Care | Payer: Self-pay | Source: Home / Self Care | Attending: Family Medicine | Admitting: Family Medicine

## 2013-04-13 ENCOUNTER — Other Ambulatory Visit: Payer: Self-pay

## 2013-04-13 DIAGNOSIS — I10 Essential (primary) hypertension: Secondary | ICD-10-CM

## 2013-04-13 DIAGNOSIS — R112 Nausea with vomiting, unspecified: Secondary | ICD-10-CM

## 2013-04-13 DIAGNOSIS — E1165 Type 2 diabetes mellitus with hyperglycemia: Secondary | ICD-10-CM

## 2013-04-13 LAB — GLUCOSE, CAPILLARY
Glucose-Capillary: 322 mg/dL — ABNORMAL HIGH (ref 70–99)
Glucose-Capillary: 282 mg/dL — ABNORMAL HIGH (ref 70–99)

## 2013-04-13 LAB — POCT I-STAT, CHEM 8
BUN: 11 mg/dL (ref 6–23)
Chloride: 98 mEq/L (ref 96–112)
Creatinine, Ser: 0.8 mg/dL (ref 0.50–1.35)
Creatinine, Ser: 0.9 mg/dL (ref 0.50–1.35)
Glucose, Bld: 305 mg/dL — ABNORMAL HIGH (ref 70–99)
Hemoglobin: 15.3 g/dL (ref 13.0–17.0)
Hemoglobin: 16.3 g/dL (ref 13.0–17.0)
Potassium: 4.3 mEq/L (ref 3.5–5.1)
Potassium: 6.3 mEq/L (ref 3.5–5.1)
Sodium: 134 mEq/L — ABNORMAL LOW (ref 135–145)
Sodium: 135 mEq/L (ref 135–145)
TCO2: 27 mmol/L (ref 0–100)

## 2013-04-13 MED ORDER — ENALAPRIL MALEATE 10 MG PO TABS: 10.0000 mg | ORAL_TABLET | Freq: Every day | ORAL | Status: DC

## 2013-04-13 MED ORDER — SODIUM CHLORIDE 0.9 % IV BOLUS (SEPSIS)
1000.0000 mL | Freq: Once | INTRAVENOUS | Status: AC
  Administered 2013-04-13: 1000 mL via INTRAVENOUS

## 2013-04-13 MED ORDER — PROMETHAZINE HCL 25 MG PO TABS: 25.0000 mg | ORAL_TABLET | Freq: Four times a day (QID) | ORAL | Status: DC | PRN

## 2013-04-13 MED ORDER — ONDANSETRON 4 MG PO TBDP
ORAL_TABLET | ORAL | Status: AC
  Filled 2013-04-13: qty 1

## 2013-04-13 MED ORDER — ONDANSETRON 4 MG PO TBDP
8.0000 mg | ORAL_TABLET | Freq: Once | ORAL | Status: AC
  Administered 2013-04-13: 8 mg via ORAL

## 2013-04-13 MED ORDER — METFORMIN HCL ER 500 MG PO TB24: ORAL_TABLET | ORAL | Status: DC

## 2013-04-13 MED ORDER — ONDANSETRON 4 MG PO TBDP
ORAL_TABLET | ORAL | Status: AC
  Filled 2013-04-13: qty 2

## 2013-04-13 NOTE — ED Provider Notes (Signed)
CSN: [id number]     Arrival date & time 04/13/13  0802 History   First MD Initiated Contact with Patient 04/13/13 0820     Chief Complaint  Patient presents with  . Hypertension  . Diabetes   (Consider location/radiation/quality/duration/timing/severity/associated sxs/prior Treatment) HPI Comments: 32 year old male presents requesting refills of his metformin and enalapril. He's been out of his medications for 2 weeks. He has had some nausea and vomiting over the past week which he always has when he runs out of this medicine. He has been seen in the emergency department for this multiple times in the past. He says he feels exactly the same today as he always does and as always only gets better when he starts taking his metformin and enalapril again. He is requesting to just get his medication refills today and forego any workup. Denies any abdominal pain, fever, chills, diarrhea. He does not feel sick at this time. He says he mainly just feels hungry.  Patient is a 32 y.o. male presenting with hypertension and diabetes problem.  Hypertension Pertinent negatives include no chest pain, no abdominal pain and no shortness of breath.  Diabetes Pertinent negatives include no chest pain, no abdominal pain and no shortness of breath.    Past Medical History  Diagnosis Date  . Diabetes mellitus   . Hypertension    History reviewed. No pertinent past surgical history. Family History  Problem Relation Age of Onset  . Hypertension Mother   . Diabetes Father   . Hypertension Father    History  Substance Use Topics  . Smoking status: Former Smoker    Types: Cigarettes    Quit date: 12/26/2011  . Smokeless tobacco: Not on file  . Alcohol Use: No    Review of Systems  Constitutional: Negative for fever, chills and fatigue.  HENT: Negative for sore throat.   Eyes: Negative for visual disturbance.  Respiratory: Negative for cough and shortness of breath.   Cardiovascular: Negative for  chest pain, palpitations and leg swelling.  Gastrointestinal: Positive for nausea and vomiting. Negative for abdominal pain, diarrhea and constipation.  Genitourinary: Negative for dysuria, urgency, frequency and hematuria.  Musculoskeletal: Negative for arthralgias, myalgias, neck pain and neck stiffness.  Skin: Negative for rash.  Neurological: Positive for light-headedness. Negative for dizziness and weakness.    Allergies  Review of patient's allergies indicates no known allergies.  Home Medications   Current Outpatient Rx  Name  Route  Sig  Dispense  Refill  . enalapril (VASOTEC) 10 MG tablet   Oral   Take 1 tablet (10 mg total) by mouth daily.   30 tablet   1   . enalapril (VASOTEC) 20 MG tablet   Oral   Take 10 mg by mouth daily.         Marland Kitchen glimepiride (AMARYL) 4 MG tablet   Oral   Take 1 tablet (4 mg total) by mouth daily before breakfast.   30 tablet   1   . metFORMIN (GLUCOPHAGE) 850 MG tablet   Oral   Take 850 mg by mouth 2 (two) times daily with a meal.         . metFORMIN (GLUCOPHAGE) 850 MG tablet   Oral   Take 1 tablet (850 mg total) by mouth 2 (two) times daily with a meal.   60 tablet   1   . metFORMIN (GLUCOPHAGE-XR) 500 MG 24 hr tablet      For 2 weeks, take 1 tablet by mouth each  bedtime; After the first 2 weeks, take 1 tablet by mouth twice a day; Continue 1 tablet twice daily until seen by primary care   60 tablet   1   . promethazine (PHENERGAN) 25 MG tablet   Oral   Take 1 tablet (25 mg total) by mouth every 6 (six) hours as needed for nausea.   30 tablet   0   . promethazine (PHENERGAN) 25 MG tablet   Oral   Take 1 tablet (25 mg total) by mouth every 6 (six) hours as needed for nausea or vomiting.   30 tablet   0    BP 153/111  Pulse 65  Temp(Src) 97.6 F (36.4 C) (Oral)  Resp 18  SpO2 100% Physical Exam  Nursing note and vitals reviewed. Constitutional: He is oriented to person, place, and time. He appears  well-developed and well-nourished. No distress.  HENT:  Head: Normocephalic.  Cardiovascular: Normal rate, regular rhythm and normal heart sounds.   Pulmonary/Chest: Effort normal and breath sounds normal. No respiratory distress.  Abdominal: Soft. There is no tenderness.  Neurological: He is alert and oriented to person, place, and time. Coordination normal.  Skin: Skin is warm and dry. No rash noted. He is not diaphoretic.  Psychiatric: He has a normal mood and affect. Judgment normal.    ED Course  Procedures (including critical care time) Labs Review Labs Reviewed  GLUCOSE, CAPILLARY - Abnormal; Notable for the following:    Glucose-Capillary 322 (*)    All other components within normal limits  POCT I-STAT, CHEM 8 - Abnormal; Notable for the following:    Sodium 134 (*)    Potassium 6.3 (*)    Glucose, Bld 305 (*)    All other components within normal limits  POCT I-STAT, CHEM 8 - Abnormal; Notable for the following:    Glucose, Bld 310 (*)    All other components within normal limits   Imaging Review No results found.  I-stat initially showed profound hyperkalemia, however was drawn from IV so possibly hemolyzed.  K+ normal on re-check  EKG unchanged from previous  CBG is down to 282 after bolus, patient feeling much better  MDM   1. Diabetes type 2, uncontrolled   2. Hypertension, uncontrolled   3. Nausea & vomiting    Patient is significantly improved symptomatically after saline bolus. He has had nausea and vomiting with metformin in the past, will start him on a lower dose and have him titrate up over the next couple months. [facility] here to set up a primary care appointment. Refilled enalapril as well as Phenergan. Followup PRN if vomiting returns or he starts to feel worse    Meds ordered this encounter  Medications  . ondansetron (ZOFRAN-ODT) disintegrating tablet 8 mg    Sig:   . sodium  chloride 0.9 % bolus 1,000 mL    Sig:   . metFORMIN (GLUCOPHAGE-XR) 500 MG 24 hr tablet    Sig: For 2 weeks, take 1 tablet by mouth each bedtime; After the first 2 weeks, take 1 tablet by mouth twice a day; Continue 1 tablet twice daily until seen by primary care    Dispense:  60 tablet    Refill:  1    Order Specific Question:  Supervising Provider    Answer:  Clementeen Graham, S [[id number]]  . enalapril (VASOTEC) 10 MG tablet    Sig: Take 1 tablet (10 mg total)  by mouth daily.    Dispense:  30 tablet    Refill:  1    Order Specific Question:  Supervising Provider    Answer:  Clementeen Graham, S [id number]  . promethazine (PHENERGAN) 25 MG tablet    Sig: Take 1 tablet (25 mg total) by mouth every 6 (six) hours as needed for nausea or vomiting.    Dispense:  30 tablet    Refill:  0    Order Specific Question:  Supervising Provider    Answer:  Clementeen Graham, Kathie Rhodes [[id number]]      Graylon Good, PA-C 04/13/13 1043

## 2013-04-13 NOTE — ED Notes (Signed)
Pt c/o HTN and DM... He does not have medical ins or a PCP Woke up this am vomiting and feeling nauseas and light headed Needing refills on his meds Denies: SOB, CP, HA, BV, edema, weakness, diaphoresis He is alert w/no signs of acute distress.

## 2013-04-14 NOTE — ED Provider Notes (Signed)
Medical screening examination/treatment/procedure(s) were performed by a resident physician or non-physician practitioner and as the supervising physician I was immediately available for consultation/collaboration.  Nyeem Stoke, MD    Ruth Kovich S Jonetta Dagley, MD 04/14/13 0833 

## 2013-05-10 ENCOUNTER — Ambulatory Visit: Payer: Self-pay | Admitting: Internal Medicine

## 2013-05-10 ENCOUNTER — Ambulatory Visit: Payer: Self-pay

## 2013-10-14 ENCOUNTER — Encounter (HOSPITAL_COMMUNITY): Payer: Self-pay | Admitting: Emergency Medicine

## 2013-10-14 ENCOUNTER — Emergency Department (HOSPITAL_COMMUNITY)
Admission: EM | Admit: 2013-10-14 | Discharge: 2013-10-14 | Disposition: A | Discharge status: Home / Self Care | Payer: No Typology Code available for payment source | Attending: Emergency Medicine | Admitting: Emergency Medicine

## 2013-10-14 DIAGNOSIS — R112 Nausea with vomiting, unspecified: Secondary | ICD-10-CM | POA: Insufficient documentation

## 2013-10-14 DIAGNOSIS — R1013 Epigastric pain: Secondary | ICD-10-CM | POA: Insufficient documentation

## 2013-10-14 DIAGNOSIS — M549 Dorsalgia, unspecified: Secondary | ICD-10-CM

## 2013-10-14 DIAGNOSIS — R739 Hyperglycemia, unspecified: Secondary | ICD-10-CM

## 2013-10-14 DIAGNOSIS — M545 Low back pain, unspecified: Secondary | ICD-10-CM | POA: Insufficient documentation

## 2013-10-14 DIAGNOSIS — Z87891 Personal history of nicotine dependence: Secondary | ICD-10-CM | POA: Insufficient documentation

## 2013-10-14 DIAGNOSIS — Z79899 Other long term (current) drug therapy: Secondary | ICD-10-CM | POA: Insufficient documentation

## 2013-10-14 DIAGNOSIS — E119 Type 2 diabetes mellitus without complications: Secondary | ICD-10-CM | POA: Insufficient documentation

## 2013-10-14 DIAGNOSIS — I10 Essential (primary) hypertension: Secondary | ICD-10-CM | POA: Insufficient documentation

## 2013-10-14 LAB — COMPREHENSIVE METABOLIC PANEL
ALT: 15 U/L (ref 0–53)
AST: 25 U/L (ref 0–37)
Albumin: 4 g/dL (ref 3.5–5.2)
Alkaline Phosphatase: 92 U/L (ref 39–117)
BUN: 8 mg/dL (ref 6–23)
CALCIUM: 9.6 mg/dL (ref 8.4–10.5)
CO2: 25 meq/L (ref 19–32)
CREATININE: 0.63 mg/dL (ref 0.50–1.35)
Chloride: 92 mEq/L — ABNORMAL LOW (ref 96–112)
GFR calc Af Amer: >90 mL/min (ref >90)
GLUCOSE: 332 mg/dL — AB (ref 70–99)
Potassium: 4.4 mEq/L (ref 3.7–5.3)
Sodium: 132 mEq/L — ABNORMAL LOW (ref 137–147)
Total Bilirubin: 0.5 mg/dL (ref 0.3–1.2)
Total Protein: 8.3 g/dL (ref 6.0–8.3)

## 2013-10-14 LAB — URINALYSIS, ROUTINE W REFLEX MICROSCOPIC
Bilirubin Urine: NEGATIVE
Glucose, UA: >1000 mg/dL — AB
Hgb urine dipstick: NEGATIVE
Ketones, ur: 15 mg/dL — AB
Leukocytes, UA: NEGATIVE
Nitrite: NEGATIVE
PROTEIN: NEGATIVE mg/dL
Specific Gravity, Urine: 1.04 — ABNORMAL HIGH (ref 1.005–1.030)
Urobilinogen, UA: 1 mg/dL (ref 0.0–1.0)
pH: 8 (ref 5.0–8.0)

## 2013-10-14 LAB — CBC
HCT: 40.2 % (ref 39.0–52.0)
HEMOGLOBIN: 14.2 g/dL (ref 13.0–17.0)
MCH: 28.9 pg (ref 26.0–34.0)
MCHC: 35.3 g/dL (ref 30.0–36.0)
MCV: 81.9 fL (ref 78.0–100.0)
Platelets: 253 10*3/uL (ref 150–400)
RBC: 4.91 MIL/uL (ref 4.22–5.81)
RDW: 12.5 % (ref 11.5–15.5)
WBC: 7 10*3/uL (ref 4.0–10.5)

## 2013-10-14 LAB — URINE MICROSCOPIC-ADD ON

## 2013-10-14 LAB — CBG MONITORING, ED
GLUCOSE-CAPILLARY: 318 mg/dL — AB (ref 70–99)
Glucose-Capillary: 264 mg/dL — ABNORMAL HIGH (ref 70–99)

## 2013-10-14 LAB — LIPASE, BLOOD: Lipase: 524 U/L — ABNORMAL HIGH (ref 11–59)

## 2013-10-14 MED ORDER — TRAMADOL HCL 50 MG PO TABS
50.0000 mg | ORAL_TABLET | Freq: Once | ORAL | Status: AC
  Administered 2013-10-14: 50 mg via ORAL
  Filled 2013-10-14: qty 1

## 2013-10-14 MED ORDER — TRAMADOL HCL 50 MG PO TABS: 50.0000 mg | ORAL_TABLET | Freq: Four times a day (QID) | ORAL | Status: DC | PRN

## 2013-10-14 MED ORDER — SODIUM CHLORIDE 0.9 % IV BOLUS (SEPSIS)
500.0000 mL | Freq: Once | INTRAVENOUS | Status: AC
  Administered 2013-10-14: 500 mL via INTRAVENOUS

## 2013-10-14 MED ORDER — ACCU-CHEK MULTICLIX LANCETS MISC: Status: DC

## 2013-10-14 MED ORDER — SODIUM CHLORIDE 0.9 % IV BOLUS (SEPSIS)
1000.0000 mL | Freq: Once | INTRAVENOUS | Status: AC
  Administered 2013-10-14: 1000 mL via INTRAVENOUS

## 2013-10-14 NOTE — ED Notes (Signed)
Pt reports to the ED via [facility] for eval of mid back pain, nausea, and vomiting. Pt reports the back pain started yesterday and the N/V started today. Pt reports the back pain radiates into bilateral flanks. Pt denies urinary symptoms, paralysis, or bowel or bladder changes. Pt is afebrile on arrival. Pt does reports some numbness and tingling but reports he only feels it when he is vomiting. Pt vomited clear and food character emesis 5-6 times. Denies any hematemesis, diarrhea, Pt denies any injury to his back. Pt hypertensive and hyperglycemic en route and on arrival. Pt denies any blurred vision, CP, or SOB. Pt has taken his BP and diabetic medication today. Pt A&Ox4, resp e/u, and pt ambulatory without difficulty.

## 2013-10-14 NOTE — ED Provider Notes (Signed)
CSN: [id number]     Arrival date & time 10/14/13  1259 History   First MD Initiated Contact with Patient 10/14/13 1300     Chief Complaint  Patient presents with  . Back Pain  . Nausea  . Emesis     (Consider location/radiation/quality/duration/timing/severity/associated sxs/prior Treatment) HPI Comments: 33 year old male with a past medical history of diabetes and hypertension presents to the emergency department via [facility] complaining of mid back pain, nausea, vomiting and abdominal pain x1 day. Patient states his symptoms began yesterday evening. Patient states his pain is in his mid to lower back, radiating around his flanks. He also reports generalized abdominal pain, worse in midepigastric area, nonradiating. Pain is constant. States he had multiple episodes of nonbloody, nonbilious emesis, when he vomits he has a numbness and tingling sensation to his fingers and toes. Otherwise denies no pain, numbness or tingling radiating down his extremities, no loss of control bowels or bladder or saddle anesthesia. States he has been compliant with his antihypertensive and diabetic medications. He was recently taken off of metformin and put on glipizide. Denies polyuria. Admits to polydipsia. Denies increased urinary frequency, urgency, dysuria or hematuria, diarrhea, fever or chills, vision changes, confusion, chest pain or sob. Denies testicular pain or swelling. Denies any injury, trauma or abnormal lifting regarding his back pain.  Patient is a 33 y.o. male presenting with back pain and vomiting. The history is provided by the patient and the EMS personnel.  Back Pain Associated symptoms: abdominal pain   Emesis Associated symptoms: abdominal pain     Past Medical History  Diagnosis Date  . Diabetes mellitus   . Hypertension    History reviewed. No pertinent past surgical history. Family History  Problem Relation Age of Onset  . Hypertension Mother   . Diabetes Father   . Hypertension  Father    History  Substance Use Topics  . Smoking status: Former Smoker    Types: Cigarettes    Quit date: 12/26/2011  . Smokeless tobacco: Not on file  . Alcohol Use: No    Review of Systems  Gastrointestinal: Positive for nausea, vomiting and abdominal pain.  Endocrine: Positive for polydipsia.  Musculoskeletal: Positive for back pain.  All other systems reviewed and are negative.     Allergies  Review of patient's allergies indicates no known allergies.  Home Medications   Prior to Admission medications   Medication Sig Start Date End Date Taking? Authorizing Provider  enalapril (VASOTEC) 10 MG tablet Take 10 mg by mouth daily. 04/13/13  Yes Adrian Blackwater Baker, PA-C  glimepiride (AMARYL) 4 MG tablet Take 4 mg by mouth daily before breakfast. 02/06/13  Yes Gwyneth Sprout, MD  enalapril (VASOTEC) 20 MG tablet Take 10 mg by mouth daily. 04/15/12 04/15/13  Elson Areas, PA-C  Lancets (ACCU-CHEK MULTICLIX) lancets Use as instructed 10/14/13   Trevor Mace, PA-C  metFORMIN (GLUCOPHAGE) 850 MG tablet Take 850 mg by mouth 2 (two) times daily with a meal. 04/15/12 04/15/13  Elson Areas, PA-C  traMADol (ULTRAM) 50 MG tablet Take 1 tablet (50 mg total) by mouth every 6 (six) hours as needed. 10/14/13   Trevor Mace, PA-C   BP 128/90  Pulse 70  Temp(Src) 98 F (36.7 C) (Oral)  SpO2 99% Physical Exam  Nursing note and vitals reviewed. Constitutional: He is oriented to person, place, and time. He appears well-developed and well-nourished. No distress.  HENT:  Head: Normocephalic and atraumatic.  Mouth/Throat: Oropharynx is clear and  moist.  Eyes: Conjunctivae and EOM are normal. Pupils are equal, round, and reactive to light. No scleral icterus.  Neck: Normal range of motion. Neck supple. No spinous process tenderness and no muscular tenderness present.  Cardiovascular: Normal rate, regular rhythm and normal heart sounds.   Pulmonary/Chest: Effort normal and breath sounds normal.  No respiratory distress.  Abdominal: Normal appearance. He exhibits no distension, no abdominal bruit and no pulsatile midline mass. There is tenderness.  Generalized abdominal tenderness, worse in midepigastric. No rigidity, guarding or rebound. No. No signs. No CVA tenderness.  Musculoskeletal: Normal range of motion. He exhibits no edema.  Tender to palpation across mid to lower back.  Neurological: He is alert and oriented to person, place, and time. He has normal strength.  Strength lower extremities 5/5 and equal bilateral. Sensation intact. Normal gait.  Skin: Skin is warm and dry. No rash noted. He is not diaphoretic.  Psychiatric: He has a normal mood and affect. His behavior is normal.    ED Course  Procedures (including critical care time) Labs Review Labs Reviewed  COMPREHENSIVE METABOLIC PANEL - Abnormal; Notable for the following:    Sodium 132 (*)    Chloride 92 (*)    Glucose, Bld 332 (*)    All other components within normal limits  LIPASE, BLOOD - Abnormal; Notable for the following:    Lipase 524 (*)    All other components within normal limits  URINALYSIS, ROUTINE W REFLEX MICROSCOPIC - Abnormal; Notable for the following:    APPearance HAZY (*)    Specific Gravity, Urine 1.040 (*)    Glucose, UA >1000 (*)    Ketones, ur 15 (*)    All other components within normal limits  URINE MICROSCOPIC-ADD ON - Abnormal; Notable for the following:    Squamous Epithelial / LPF MANY (*)    All other components within normal limits  CBG MONITORING, ED - Abnormal; Notable for the following:    Glucose-Capillary 318 (*)    All other components within normal limits  CBG MONITORING, ED - Abnormal; Notable for the following:    Glucose-Capillary 264 (*)    All other components within normal limits  CBC    Imaging Review No results found.   EKG Interpretation None      MDM   Final diagnoses:  Hyperglycemia  Back pain   Patient presenting with back pain, nausea,  vomiting and abdominal pain. He is well appearing and in no apparent distress. Afebrile, hypertensive at 170/99, vitals otherwise stable. Abdomen is soft, no. Neil signs. Pain mostly in midepigastric area. No pulsatile midline abdominal mass or abdominal bruit. Abdominal pain does not radiate to the back. His back pain radiates around his flanks. No CVA tenderness. Plan to check labs and give IV fluids. No red flags concerning patient's back pain. No s/s of central cord compression or cauda equina. Lower extremities are neurovascularly intact and patient is ambulating without difficulty. 3:23 PM Patient reports his abdominal pain, nausea and vomiting have subsided. After receiving IV fluids, his blood glucose decreased to 264. He ate a sandwich and did not vomit. Blood pressure decreased to 125/80 once patient became comfortable. Back pain still present, however not as bad as when he arrived. Back pain reproducible. Patient is stable for discharge, resources given for PCP followup. Discussed importance of monitoring blood glucose at home. We'll discharge with tramadol for back pain. Return precautions given. Patient states understanding of treatment care plan and is agreeable.  Nada Boozerobyn M  Mathis Fare, PA-C 10/14/13 1524

## 2013-10-14 NOTE — ED Provider Notes (Signed)
Medical screening examination/treatment/procedure(s) were performed by non-physician practitioner and as supervising physician I was immediately available for consultation/collaboration.   EKG Interpretation None        Samar Venneman N Eran Windish, DO 10/14/13 1536 

## 2013-10-14 NOTE — Discharge Instructions (Signed)
Take tramadol as directed as needed for pain. It is important for you to check your sugar levels daily. Followup with the wellness clinic to establish care with a primary care physician.  Back Pain, Adult Low back pain is very common. About 1 in 5 people have back pain.The cause of low back pain is rarely dangerous. The pain often gets better over time.About half of people with a sudden onset of back pain feel better in just 2 weeks. About 8 in 10 people feel better by 6 weeks.  CAUSES Some common causes of back pain include:  Strain of the muscles or ligaments supporting the spine.  Wear and tear (degeneration) of the spinal discs.  Arthritis.  Direct injury to the back. DIAGNOSIS Most of the time, the direct cause of low back pain is not known.However, back pain can be treated effectively even when the exact cause of the pain is unknown.Answering your caregiver's questions about your overall health and symptoms is one of the most accurate ways to make sure the cause of your pain is not dangerous. If your caregiver needs more information, he or she may order lab work or imaging tests (X-rays or MRIs).However, even if imaging tests show changes in your back, this usually does not require surgery. HOME CARE INSTRUCTIONS For many people, back pain returns.Since low back pain is rarely dangerous, it is often a condition that people can learn to [facility] their own.   Remain active. It is stressful on the back to sit or stand in one place. Do not sit, drive, or stand in one place for more than 30 minutes at a time. Take short walks on level surfaces as soon as pain allows.Try to increase the length of time you walk each day.  Do not stay in bed.Resting more than 1 or 2 days can delay your recovery.  Do not avoid exercise or work.Your body is made to move.It is not dangerous to be active, even though your back may hurt.Your back will likely heal faster if you return to being active  before your pain is gone.  Pay attention to your body when you bend and lift. Many people have less discomfortwhen lifting if they bend their knees, keep the load close to their bodies,and avoid twisting. Often, the most comfortable positions are those that put less stress on your recovering back.  Find a comfortable position to sleep. Use a firm mattress and lie on your side with your knees slightly bent. If you lie on your back, put a pillow under your knees.  Only take over-the-counter or prescription medicines as directed by your caregiver. Over-the-counter medicines to reduce pain and inflammation are often the most helpful.Your caregiver may prescribe muscle relaxant drugs.These medicines help dull your pain so you can more quickly return to your normal activities and healthy exercise.  Put ice on the injured area.  Put ice in a plastic bag.  Place a towel between your skin and the bag.  Leave the ice on for 15-20 minutes, 03-04 times a day for the first 2 to 3 days. After that, ice and heat may be alternated to reduce pain and spasms.  Ask your caregiver about trying back exercises and gentle massage. This may be of some benefit.  Avoid feeling anxious or stressed.Stress increases muscle tension and can worsen back pain.It is important to recognize when you are anxious or stressed and learn ways to manage it.Exercise is a great option. SEEK MEDICAL CARE IF:  You have  pain that is not relieved with rest or medicine.  You have pain that does not improve in 1 week.  You have new symptoms.  You are generally not feeling well. SEEK IMMEDIATE MEDICAL CARE IF:   You have pain that radiates from your back into your legs.  You develop new bowel or bladder control problems.  You have unusual weakness or numbness in your arms or legs.  You develop nausea or vomiting.  You develop abdominal pain.  You feel faint. Document Released: 04/29/2005 Document Revised: 10/29/2011  Document Reviewed: 09/17/2010 [facility] Patient Information 2014 [place], [place].  Glucose, Blood Sugar, Fasting Blood Sugar This is a test to measure your blood sugar. Glucose is a simple sugar that serves as the main source of energy for the body. The carbohydrates we eat are broken down into glucose (and a few other simple sugars), absorbed by the small intestine, and circulated throughout the body. Most of the body's cells require glucose for energy production; brain and nervous system cells not only rely on glucose for energy, they can only function when glucose levels in the blood remain above a certain level.  The body's use of glucose hinges on the availability of insulin, a hormone produced by the pancreas. Insulin acts as a Engineer, maintenance, transporting glucose into the body's cells, directing the body to store excess glucose as glycogen (for short-term storage) and/or as triglycerides in fat cells. We can not live without glucose or insulin, and they must be in balance.  Normally, blood glucose levels rise slightly after a meal, and insulin is secreted to lower them, with the amount of insulin released matched up with the size and content of the meal. If blood glucose levels drop too low, such as might occur in between meals or after a strenuous workout, glucagon (another pancreatic hormone) is secreted to tell the liver to turn some glycogen back into glucose, raising the blood glucose levels. If the glucose/insulin feedback mechanism is working properly, the amount of glucose in the blood remains fairly stable. If the balance is disrupted and glucose levels in the blood rise, then the body tries to restore the balance, both by increasing insulin production and by excreting glucose in the urine.  PREPARATION FOR TEST A blood sample drawn from a vein in your arm or, for a self check, a drop of blood from a skin prick; in general, it may be recommended that you fast before having a blood glucose  test; sometimes a random (no preparation) urine sample is used. Your caregiver will instruct you as to what they want prior to your testing. NORMAL FINDINGS Normal values depend on many factors. Your lab will provide a range of normal values with your test results. The following information summarizes the meaning of the test results. These are based on the clinical practice recommendations of the American Diabetes Association.  FASTING BLOOD GLUCOSE  From 70 to 99 mg/dL (3.9 to 5.5 mmol/L): Normal glucose tolerance  From 100 to 125 mg/dL (5.6 to 6.9 mmol/L):Impaired fasting glucose (pre-diabetes)  126 mg/dL (7.0 mmol/L) and above on more than one testing occasion: Diabetes ORAL GLUCOSE TOLERANCE TEST (OGTT) [EXCEPT PREGNANCY] (2 HOURS AFTER A 75-GRAM GLUCOSE DRINK)  Less than 140 mg/dL (7.8 mmol/L): Normal glucose tolerance  From 140 to 200 mg/dL (7.8 to 16.1 mmol/L): Impaired glucose tolerance (pre-diabetes)  Over 200 mg/dL (09.6 mmol/L) on more than one testing occasion: Diabetes GESTATIONAL DIABETES SCREENING: GLUCOSE CHALLENGE TEST (1 HOUR AFTER A 50-GRAM GLUCOSE DRINK)  Less than 140* mg/dL (7.8 mmol/L): Normal glucose tolerance  140* mg/dL (7.8 mmol/L) and over: Abnormal, needs OGTT (see below) * Some use a cutoff of More Than 130 mg/dL (7.2 mmol/L) because that identifies 90% of women with gestational diabetes, compared to 80% identified using the threshold of More Than 140 mg/dL (7.8 mmol/L). GESTATIONAL DIABETES DIAGNOSTIC: OGTT (100-GRAM GLUCOSE DRINK)  Fasting*..........................................95 mg/dL (5.3 mmol/L)  1 hour after glucose load*..............180 mg/dL (45.4 mmol/L)  2 hours after glucose load*.............155 mg/dL (8.6 mmol/L)  3 hours after glucose load* **.........140 mg/dL (7.8 mmol/L) * If two or more values are above the criteria, gestational diabetes is diagnosed. ** A 75-gram glucose load may be used, although this method is not as well  validated as the 100-gram OGTT; the 3-hour sample is not drawn if 75 grams is used.  Ranges for normal findings may vary among different laboratories and hospitals. You should always check with your doctor after having lab work or other tests done to discuss the meaning of your test results and whether your values are considered within normal limits. MEANING OF TEST  Your caregiver will go over the test results with you and discuss the importance and meaning of your results, as well as treatment options and the need for additional tests if necessary. OBTAINING THE TEST RESULTS It is your responsibility to obtain your test results. Ask the lab or department performing the test when and how you will get your results. Document Released: 05/31/2004 Document Revised: 07/22/2011 Document Reviewed: 04/09/2008 [facility] Patient Information 2014 [place], [place].  Hyperglycemia Hyperglycemia occurs when the glucose (sugar) in your blood is too high. Hyperglycemia can happen for many reasons, but it most often happens to people who do not know they have diabetes or are not managing their diabetes properly.  CAUSES  Whether you have diabetes or not, there are other causes of hyperglycemia. Hyperglycemia can occur when you have diabetes, but it can also occur in other situations that you might not be as aware of, such as: Diabetes  If you have diabetes and are having problems controlling your blood glucose, hyperglycemia could occur because of some of the following reasons:  Not following your meal plan.  Not taking your diabetes medications or not taking it properly.  Exercising less or doing less activity than you normally do.  Being sick. Pre-diabetes  This cannot be ignored. Before people develop Type 2 diabetes, they almost always have "pre-diabetes." This is when your blood glucose levels are higher than normal, but not yet high enough to be diagnosed as diabetes. Research has shown that some  long-term damage to the body, especially the heart and circulatory system, may already be occurring during pre-diabetes. If you take action to manage your blood glucose when you have pre-diabetes, you may delay or prevent Type 2 diabetes from developing. Stress  If you have diabetes, you may be "diet" controlled or on oral medications or insulin to control your diabetes. However, you may find that your blood glucose is higher than usual in the hospital whether you have diabetes or not. This is often referred to as "stress hyperglycemia." Stress can elevate your blood glucose. This happens because of hormones put out by the body during times of stress. If stress has been the cause of your high blood glucose, it can be followed regularly by your caregiver. That way he/she can make sure your hyperglycemia does not continue to get worse or progress to diabetes. Steroids  Steroids are medications that act  on the infection fighting system (immune system) to block inflammation or infection. One side effect can be a rise in blood glucose. Most people can produce enough extra insulin to allow for this rise, but for those who cannot, steroids make blood glucose levels go even higher. It is not unusual for steroid treatments to "uncover" diabetes that is developing. It is not always possible to determine if the hyperglycemia will go away after the steroids are stopped. A special blood test called an A1c is sometimes done to determine if your blood glucose was elevated before the steroids were started. SYMPTOMS  Thirsty.  Frequent urination.  Dry mouth.  Blurred vision.  Tired or fatigue.  Weakness.  Sleepy.  Tingling in feet or leg. DIAGNOSIS  Diagnosis is made by monitoring blood glucose in one or all of the following ways:  A1c test. This is a chemical found in your blood.  Fingerstick blood glucose monitoring.  Laboratory results. TREATMENT  First, knowing the cause of the hyperglycemia is  important before the hyperglycemia can be treated. Treatment may include, but is not be limited to:  Education.  Change or adjustment in medications.  Change or adjustment in meal plan.  Treatment for an illness, infection, etc.  More frequent blood glucose monitoring.  Change in exercise plan.  Decreasing or stopping steroids.  Lifestyle changes. HOME CARE INSTRUCTIONS   Test your blood glucose as directed.  Exercise regularly. Your caregiver will give you instructions about exercise. Pre-diabetes or diabetes which comes on with stress is helped by exercising.  Eat wholesome, balanced meals. Eat often and at regular, fixed times. Your caregiver or nutritionist will give you a meal plan to guide your sugar intake.  Being at an ideal weight is important. If needed, losing as little as 10 to 15 pounds may help improve blood glucose levels. SEEK MEDICAL CARE IF:   You have questions about medicine, activity, or diet.  You continue to have symptoms (problems such as increased thirst, urination, or weight gain). SEEK IMMEDIATE MEDICAL CARE IF:   You are vomiting or have diarrhea.  Your breath smells fruity.  You are breathing faster or slower.  You are very sleepy or incoherent.  You have numbness, tingling, or pain in your feet or hands.  You have chest pain.  Your symptoms get worse even though you have been following your caregiver's orders.  If you have any other questions or concerns. Document Released: 10/23/2000 Document Revised: 07/22/2011 Document Reviewed: 08/26/2011 [facility] Patient Information 2014 [place], [place].

## 2013-10-19 ENCOUNTER — Encounter (HOSPITAL_COMMUNITY): Payer: Self-pay | Admitting: Emergency Medicine

## 2013-10-19 ENCOUNTER — Emergency Department (HOSPITAL_COMMUNITY): Payer: No Typology Code available for payment source

## 2013-10-19 ENCOUNTER — Emergency Department (HOSPITAL_COMMUNITY)
Admission: EM | Admit: 2013-10-19 | Discharge: 2013-10-20 | Disposition: A | Discharge status: Home / Self Care | Payer: No Typology Code available for payment source | Attending: Emergency Medicine | Admitting: Emergency Medicine

## 2013-10-19 DIAGNOSIS — E119 Type 2 diabetes mellitus without complications: Secondary | ICD-10-CM | POA: Insufficient documentation

## 2013-10-19 DIAGNOSIS — K859 Acute pancreatitis without necrosis or infection, unspecified: Secondary | ICD-10-CM | POA: Insufficient documentation

## 2013-10-19 DIAGNOSIS — Z79899 Other long term (current) drug therapy: Secondary | ICD-10-CM | POA: Insufficient documentation

## 2013-10-19 DIAGNOSIS — I10 Essential (primary) hypertension: Secondary | ICD-10-CM | POA: Insufficient documentation

## 2013-10-19 DIAGNOSIS — M549 Dorsalgia, unspecified: Secondary | ICD-10-CM | POA: Insufficient documentation

## 2013-10-19 DIAGNOSIS — R109 Unspecified abdominal pain: Secondary | ICD-10-CM | POA: Insufficient documentation

## 2013-10-19 LAB — URINALYSIS, ROUTINE W REFLEX MICROSCOPIC
BILIRUBIN URINE: NEGATIVE
HGB URINE DIPSTICK: NEGATIVE
Ketones, ur: 40 mg/dL — AB
Leukocytes, UA: NEGATIVE
Nitrite: NEGATIVE
PH: 8 (ref 5.0–8.0)
Protein, ur: NEGATIVE mg/dL
SPECIFIC GRAVITY, URINE: 1.041 — AB (ref 1.005–1.030)
Urobilinogen, UA: 1 mg/dL (ref 0.0–1.0)

## 2013-10-19 LAB — COMPREHENSIVE METABOLIC PANEL
ALT: 14 U/L (ref 0–53)
AST: 19 U/L (ref 0–37)
Albumin: 4.1 g/dL (ref 3.5–5.2)
Alkaline Phosphatase: 96 U/L (ref 39–117)
BILIRUBIN TOTAL: 0.4 mg/dL (ref 0.3–1.2)
BUN: 9 mg/dL (ref 6–23)
CALCIUM: 9.7 mg/dL (ref 8.4–10.5)
CHLORIDE: 91 meq/L — AB (ref 96–112)
CO2: 25 mEq/L (ref 19–32)
Creatinine, Ser: 0.72 mg/dL (ref 0.50–1.35)
GFR calc Af Amer: >90 mL/min (ref >90)
GFR calc non Af Amer: >90 mL/min (ref >90)
Glucose, Bld: 295 mg/dL — ABNORMAL HIGH (ref 70–99)
Potassium: 3.9 mEq/L (ref 3.7–5.3)
Sodium: 134 mEq/L — ABNORMAL LOW (ref 137–147)
Total Protein: 8.7 g/dL — ABNORMAL HIGH (ref 6.0–8.3)

## 2013-10-19 LAB — URINE MICROSCOPIC-ADD ON

## 2013-10-19 LAB — CBC
HEMATOCRIT: 40.3 % (ref 39.0–52.0)
HEMOGLOBIN: 13.7 g/dL (ref 13.0–17.0)
MCH: 27.7 pg (ref 26.0–34.0)
MCHC: 34 g/dL (ref 30.0–36.0)
MCV: 81.6 fL (ref 78.0–100.0)
Platelets: 255 10*3/uL (ref 150–400)
RBC: 4.94 MIL/uL (ref 4.22–5.81)
RDW: 12.2 % (ref 11.5–15.5)
WBC: 6.4 10*3/uL (ref 4.0–10.5)

## 2013-10-19 LAB — LIPASE, BLOOD: Lipase: 104 U/L — ABNORMAL HIGH (ref 11–59)

## 2013-10-19 LAB — CBG MONITORING, ED: GLUCOSE-CAPILLARY: 283 mg/dL — AB (ref 70–99)

## 2013-10-19 MED ORDER — MORPHINE SULFATE 4 MG/ML IJ SOLN
4.0000 mg | Freq: Once | INTRAMUSCULAR | Status: AC
  Administered 2013-10-19: 4 mg via INTRAVENOUS
  Filled 2013-10-19: qty 1

## 2013-10-19 MED ORDER — IOHEXOL 300 MG/ML  SOLN
100.0000 mL | Freq: Once | INTRAMUSCULAR | Status: AC | PRN
  Administered 2013-10-19: 100 mL via INTRAVENOUS

## 2013-10-19 MED ORDER — ONDANSETRON HCL 4 MG/2ML IJ SOLN
4.0000 mg | Freq: Once | INTRAMUSCULAR | Status: AC
  Administered 2013-10-19: 4 mg via INTRAVENOUS
  Filled 2013-10-19: qty 2

## 2013-10-19 MED ORDER — SODIUM CHLORIDE 0.9 % IV BOLUS (SEPSIS)
2000.0000 mL | Freq: Once | INTRAVENOUS | Status: AC
  Administered 2013-10-19: 2000 mL via INTRAVENOUS

## 2013-10-19 NOTE — ED Notes (Signed)
CT notified pt finished drinking contrast  

## 2013-10-19 NOTE — ED Provider Notes (Signed)
CSN: [id number]     Arrival date & time 10/19/13  2026 History   First MD Initiated Contact with Patient 10/19/13 2153     Chief Complaint  Patient presents with  . Emesis  . Back Pain  . Hyperglycemia     (Consider location/radiation/quality/duration/timing/severity/associated sxs/prior Treatment) HPI 33 year old male presents with vomiting and back pain. The history is mostly taken from the wife as the patient defers to her to give a history. Per the wife and the patient are poor historians as far as localizing what is new and what is old. Wife states he's had back pain for several months but seems to be worse over the last week or so. He was seen in the ER on 10/14/13 and discharge after pain and nausea medicines. Wife states the patient is not feeling better at that time. He's continued to have vomiting at home. Mastoid abdominal pain he points to his bilateral sides. Denies any diarrhea. Denies any urinary symptoms. He's been having hyperglycemia as well and has not been able to get on a new diabetes medicines because insurance will pay for it. He is taking his lisinopril and glipizide at this time. He's been trying Flexeril and tramadol with no relief of his pain. Denies any fevers.  Past Medical History  Diagnosis Date  . Diabetes mellitus   . Hypertension    History reviewed. No pertinent past surgical history. Family History  Problem Relation Age of Onset  . Hypertension Mother   . Diabetes Father   . Hypertension Father    History  Substance Use Topics  . Smoking status: Former Smoker    Types: Cigarettes    Quit date: 12/26/2011  . Smokeless tobacco: Not on file  . Alcohol Use: No    Review of Systems  Constitutional: Negative for fever.  Gastrointestinal: Positive for nausea, vomiting and abdominal pain. Negative for diarrhea.  Genitourinary: Negative for dysuria.  Musculoskeletal: Positive for back pain.  Neurological: Negative for weakness.  All other systems  reviewed and are negative.     Allergies  Review of patient's allergies indicates no known allergies.  Home Medications   Prior to Admission medications   Medication Sig Start Date End Date Taking? Authorizing Provider  acetaminophen (TYLENOL) 500 MG tablet Take 500 mg by mouth every 6 (six) hours as needed for moderate pain.   Yes Historical Provider, MD  cyclobenzaprine (FLEXERIL) 10 MG tablet Take 10 mg by mouth 2 (two) times daily as needed for muscle spasms.   Yes Historical Provider, MD  glipiZIDE (GLUCOTROL XL) 10 MG 24 hr tablet Take 10 mg by mouth daily with breakfast.   Yes Historical Provider, MD  lisinopril (PRINIVIL,ZESTRIL) 10 MG tablet Take 10 mg by mouth daily.   Yes Historical Provider, MD  traMADol (ULTRAM) 50 MG tablet Take 50 mg by mouth every 6 (six) hours as needed for moderate pain. 10/14/13  Yes Trevor Mace, PA-C  enalapril (VASOTEC) 20 MG tablet Take 10 mg by mouth daily. 04/15/12 04/15/13  Elson Areas, PA-C  metFORMIN (GLUCOPHAGE) 850 MG tablet Take 850 mg by mouth 2 (two) times daily with a meal. 04/15/12 04/15/13  Lonia Skinner Sofia, PA-C   BP 180/103  Pulse 74  Temp(Src) 98.5 F (36.9 C) (Oral)  Resp 16  Ht 5\' 7"  (1.702 m)  Wt 240 lb (108.863 kg)  BMI 37.58 kg/m2  SpO2 100% Physical Exam  Nursing note and vitals reviewed. Constitutional: He is oriented to person, place, and time.  He appears well-developed and well-nourished. No distress.  HENT:  Head: Normocephalic and atraumatic.  Right Ear: External ear normal.  Left Ear: External ear normal.  Nose: Nose normal.  Eyes: Right eye exhibits no discharge. Left eye exhibits no discharge.  Neck: Neck supple.  Cardiovascular: Normal rate, regular rhythm, normal heart sounds and intact distal pulses.   Pulmonary/Chest: Effort normal and breath sounds normal.  Abdominal: Soft. He exhibits no distension. There is no tenderness.  Musculoskeletal: He exhibits no edema.  Neurological: He is alert and oriented  to person, place, and time.  Skin: Skin is warm and dry.    ED Course  Procedures (including critical care time) Labs Review Labs Reviewed  COMPREHENSIVE METABOLIC PANEL - Abnormal; Notable for the following:    Sodium 134 (*)    Chloride 91 (*)    Glucose, Bld 295 (*)    Total Protein 8.7 (*)    All other components within normal limits  URINALYSIS, ROUTINE W REFLEX MICROSCOPIC - Abnormal; Notable for the following:    Specific Gravity, Urine 1.041 (*)    Glucose, UA >1000 (*)    Ketones, ur 40 (*)    All other components within normal limits  LIPASE, BLOOD - Abnormal; Notable for the following:    Lipase 104 (*)    All other components within normal limits  URINE MICROSCOPIC-ADD ON - Abnormal; Notable for the following:    Squamous Epithelial / LPF FEW (*)    All other components within normal limits  CBG MONITORING, ED - Abnormal; Notable for the following:    Glucose-Capillary 283 (*)    All other components within normal limits  CBC    Imaging Review Ct Abdomen Pelvis W Contrast  10/20/2013   CLINICAL DATA:  Nausea, vomiting, weakness.  Hyperglycemia.  EXAM: CT ABDOMEN AND PELVIS WITH CONTRAST  TECHNIQUE: Multidetector CT imaging of the abdomen and pelvis was performed using the standard protocol following bolus administration of intravenous contrast.  CONTRAST:  100mL OMNIPAQUE IOHEXOL 300 MG/ML  SOLN  COMPARISON:  None available for comparison at time of study interpretation.  FINDINGS: LUNG BASES: Included view of the lung bases are clear. Visualized heart and pericardium are unremarkable.  SOLID ORGANS: The liver, spleen, gallbladder, pancreas and adrenal glands are unremarkable.  GASTROINTESTINAL TRACT: Small hiatal hernia. The stomach, small and large bowel are normal in course and caliber without inflammatory changes. Enteric contrast has not yet reached the distal small bowel. Normal appendix.  KIDNEYS/ URINARY TRACT: Kidneys are orthotopic, demonstrating symmetric  enhancement. No nephrolithiasis, hydronephrosis or renal masses. The unopacified ureters are normal in course and caliber. Urinary bladder is partially distended and unremarkable.  PERITONEUM/RETROPERITONEUM: No intraperitoneal free fluid nor free air. Aortoiliac vessels are normal in course and caliber. No lymphadenopathy by CT size criteria. Internal reproductive organs are unremarkable.  SOFT TISSUE/OSSEOUS STRUCTURES: Nonsuspicious.  IMPRESSION: No acute intra-abdominal or pelvic process.   Electronically Signed   By: Awilda Metroourtnay  Bloomer   On: 10/20/2013 00:15     EKG Interpretation None      MDM   Final diagnoses:  Back pain  Pancreatitis    Patient here is poor historian, but is lying still, does not appear in severe pain. Wife relates this back pain has been for months, no bony tenderness or neuro symptoms. No abdominal tenderness. Review of last visit shows lipase of 500, never had pancreatitis before. Is downtrending, now around 100, and no significant abd tenderness. CT shows no acute pathology. No  vomiting here. Given IV fluids, has some evidence of dehydration with ketones in urine, but otherwise labs benign. Given he is currently asymptomatic, has had no vomiting or signs of serious pain, I feel he is stable for discharge to follow up with his PCP.     Audree Camel, MD 10/20/13 1105

## 2013-10-19 NOTE — ED Notes (Signed)
Main lab, Velna Hatchet notified to run D-dimer, states blood tube available to run it.

## 2013-10-19 NOTE — ED Notes (Signed)
Patient arrives with complaint of nausea, vomiting, weakness, back pain, hyperglycemia, and hypertension. Wife states this episode started about an hour ago. Blood sugars at home were up to 289. Patient has history of similar issues.

## 2013-10-20 MED ORDER — ONDANSETRON 4 MG PO TBDP: 4.0000 mg | ORAL_TABLET | ORAL | Status: DC | PRN

## 2013-10-20 MED ORDER — MORPHINE SULFATE 4 MG/ML IJ SOLN
4.0000 mg | Freq: Once | INTRAMUSCULAR | Status: AC
  Administered 2013-10-20: 4 mg via INTRAVENOUS
  Filled 2013-10-20: qty 1

## 2013-10-20 NOTE — Discharge Instructions (Signed)
Back Pain, Adult Low back pain is very common. About 1 in 5 people have back pain.The cause of low back pain is rarely dangerous. The pain often gets better over time.About half of people with a sudden onset of back pain feel better in just 2 weeks. About 8 in 10 people feel better by 6 weeks.  CAUSES Some common causes of back pain include:  Strain of the muscles or ligaments supporting the spine.  Wear and tear (degeneration) of the spinal discs.  Arthritis.  Direct injury to the back. DIAGNOSIS Most of the time, the direct cause of low back pain is not known.However, back pain can be treated effectively even when the exact cause of the pain is unknown.Answering your caregiver's questions about your overall health and symptoms is one of the most accurate ways to make sure the cause of your pain is not dangerous. If your caregiver needs more information, he or she may order lab work or imaging tests (X-rays or MRIs).However, even if imaging tests show changes in your back, this usually does not require surgery. HOME CARE INSTRUCTIONS For many people, back pain returns.Since low back pain is rarely dangerous, it is often a condition that people can learn to manageon their own.   Remain active. It is stressful on the back to sit or stand in one place. Do not sit, drive, or stand in one place for more than 30 minutes at a time. Take short walks on level surfaces as soon as pain allows.Try to increase the length of time you walk each day.  Do not stay in bed.Resting more than 1 or 2 days can delay your recovery.  Do not avoid exercise or work.Your body is made to move.It is not dangerous to be active, even though your back may hurt.Your back will likely heal faster if you return to being active before your pain is gone.  Pay attention to your body when you bend and lift. Many people have less discomfortwhen lifting if they bend their knees, keep the load close to their bodies,and  avoid twisting. Often, the most comfortable positions are those that put less stress on your recovering back.  Find a comfortable position to sleep. Use a firm mattress and lie on your side with your knees slightly bent. If you lie on your back, put a pillow under your knees.  Only take over-the-counter or prescription medicines as directed by your caregiver. Over-the-counter medicines to reduce pain and inflammation are often the most helpful.Your caregiver may prescribe muscle relaxant drugs.These medicines help dull your pain so you can more quickly return to your normal activities and healthy exercise.  Put ice on the injured area.  Put ice in a plastic bag.  Place a towel between your skin and the bag.  Leave the ice on for 15-20 minutes, 03-04 times a day for the first 2 to 3 days. After that, ice and heat may be alternated to reduce pain and spasms.  Ask your caregiver about trying back exercises and gentle massage. This may be of some benefit.  Avoid feeling anxious or stressed.Stress increases muscle tension and can worsen back pain.It is important to recognize when you are anxious or stressed and learn ways to manage it.Exercise is a great option. SEEK MEDICAL CARE IF:  You have pain that is not relieved with rest or medicine.  You have pain that does not improve in 1 week.  You have new symptoms.  You are generally not feeling well. SEEK   IMMEDIATE MEDICAL CARE IF:   You have pain that radiates from your back into your legs.  You develop new bowel or bladder control problems.  You have unusual weakness or numbness in your arms or legs.  You develop nausea or vomiting.  You develop abdominal pain.  You feel faint. Document Released: 04/29/2005 Document Revised: 10/29/2011 Document Reviewed: 09/17/2010 [employer] Patient Information 2014 [employer].  

## 2014-01-02 ENCOUNTER — Emergency Department (HOSPITAL_COMMUNITY)
Admission: EM | Admit: 2014-01-02 | Discharge: 2014-01-02 | Disposition: A | Discharge status: Home / Self Care | Payer: No Typology Code available for payment source | Attending: Emergency Medicine | Admitting: Emergency Medicine

## 2014-01-02 ENCOUNTER — Encounter (HOSPITAL_COMMUNITY): Payer: Self-pay | Admitting: Emergency Medicine

## 2014-01-02 DIAGNOSIS — Z23 Encounter for immunization: Secondary | ICD-10-CM | POA: Insufficient documentation

## 2014-01-02 DIAGNOSIS — I10 Essential (primary) hypertension: Secondary | ICD-10-CM | POA: Insufficient documentation

## 2014-01-02 DIAGNOSIS — Y9389 Activity, other specified: Secondary | ICD-10-CM | POA: Diagnosis not present

## 2014-01-02 DIAGNOSIS — Y9289 Other specified places as the place of occurrence of the external cause: Secondary | ICD-10-CM | POA: Diagnosis not present

## 2014-01-02 DIAGNOSIS — E119 Type 2 diabetes mellitus without complications: Secondary | ICD-10-CM | POA: Insufficient documentation

## 2014-01-02 DIAGNOSIS — W540XXA Bitten by dog, initial encounter: Secondary | ICD-10-CM | POA: Diagnosis not present

## 2014-01-02 DIAGNOSIS — Z87891 Personal history of nicotine dependence: Secondary | ICD-10-CM | POA: Insufficient documentation

## 2014-01-02 DIAGNOSIS — Z79899 Other long term (current) drug therapy: Secondary | ICD-10-CM | POA: Insufficient documentation

## 2014-01-02 DIAGNOSIS — T1490XA Injury, unspecified, initial encounter: Secondary | ICD-10-CM | POA: Insufficient documentation

## 2014-01-02 LAB — CBG MONITORING, ED: Glucose-Capillary: 306 mg/dL — ABNORMAL HIGH (ref 70–99)

## 2014-01-02 MED ORDER — TETANUS-DIPHTH-ACELL PERTUSSIS 5-2.5-18.5 LF-MCG/0.5 IM SUSP
0.5000 mL | Freq: Once | INTRAMUSCULAR | Status: AC
  Administered 2014-01-02: 0.5 mL via INTRAMUSCULAR
  Filled 2014-01-02: qty 0.5

## 2014-01-02 MED ORDER — AMOXICILLIN-POT CLAVULANATE 875-125 MG PO TABS
1.0000 | ORAL_TABLET | Freq: Once | ORAL | Status: AC
  Administered 2014-01-02: 1 via ORAL
  Filled 2014-01-02: qty 1

## 2014-01-02 MED ORDER — AMOXICILLIN-POT CLAVULANATE 875-125 MG PO TABS: 1.0000 | ORAL_TABLET | Freq: Two times a day (BID) | ORAL | Status: DC

## 2014-01-02 MED ORDER — IBUPROFEN 800 MG PO TABS: 800.0000 mg | ORAL_TABLET | Freq: Three times a day (TID) | ORAL | Status: DC | PRN

## 2014-01-02 NOTE — ED Notes (Signed)
CBG taken = 306

## 2014-01-02 NOTE — Discharge Instructions (Signed)

## 2014-01-02 NOTE — ED Notes (Signed)
Patient was bite by his dog.  Bite mark to chest

## 2014-01-02 NOTE — ED Provider Notes (Signed)
TIME SEEN: 8:20 AM  CHIEF COMPLAINT: Dog bite  HPI: Patient is a 33 y.o. M with history of hypertension, non-insulin-dependent diabetes who presents to the emergency department with a dog bite to his anterior chest. He states that his stepchildren were fighting which agitated his pit bull in his pit bull bit him and his children. Patient reports that the couple is fully vaccinated. This is his own personal pet. Patient denies any other systemic symptoms. States this occurred at approximately 5 AM.  He states his blood glucose has been well-controlled in the 100s to 200s recently. Today it is 306. He states he has not taken his glipizide and metformin the plan is to do so as soon as he gets home.  ROS: See HPI Constitutional: no fever  Eyes: no drainage  ENT: no runny nose   Cardiovascular:  no chest pain  Resp: no SOB  GI: no vomiting GU: no dysuria Integumentary: no rash  Allergy: no hives  Musculoskeletal: no leg swelling  Neurological: no slurred speech ROS otherwise negative  PAST MEDICAL HISTORY/PAST SURGICAL HISTORY:  Past Medical History  Diagnosis Date  . Diabetes mellitus   . Hypertension     MEDICATIONS:  Prior to Admission medications   Medication Sig Start Date End Date Taking? Authorizing Provider  acetaminophen (TYLENOL) 500 MG tablet Take 500 mg by mouth every 6 (six) hours as needed for moderate pain.    Historical Provider, MD  cyclobenzaprine (FLEXERIL) 10 MG tablet Take 10 mg by mouth 2 (two) times daily as needed for muscle spasms.    Historical Provider, MD  enalapril (VASOTEC) 20 MG tablet Take 10 mg by mouth daily. 04/15/12 04/15/13  Elson Areas, PA-C  glipiZIDE (GLUCOTROL XL) 10 MG 24 hr tablet Take 10 mg by mouth daily with breakfast.    Historical Provider, MD  lisinopril (PRINIVIL,ZESTRIL) 10 MG tablet Take 10 mg by mouth daily.    Historical Provider, MD  metFORMIN (GLUCOPHAGE) 850 MG tablet Take 850 mg by mouth 2 (two) times daily with a meal. 04/15/12  04/15/13  Elson Areas, PA-C  ondansetron (ZOFRAN ODT) 4 MG disintegrating tablet Take 1 tablet (4 mg total) by mouth every 4 (four) hours as needed for nausea or vomiting. 10/20/13   Audree Camel, MD  traMADol (ULTRAM) 50 MG tablet Take 50 mg by mouth every 6 (six) hours as needed for moderate pain. 10/14/13   Trevor Mace, PA-C    ALLERGIES:  No Known Allergies  SOCIAL HISTORY:  History  Substance Use Topics  . Smoking status: Former Smoker    Types: Cigarettes    Quit date: 12/26/2011  . Smokeless tobacco: Not on file  . Alcohol Use: No    FAMILY HISTORY: Family History  Problem Relation Age of Onset  . Hypertension Mother   . Diabetes Father   . Hypertension Father     EXAM: BP 150/100  Pulse 81  Temp(Src) 97.8 F (36.6 C) (Oral)  Resp 18  Ht  (1.727 m)  Wt 220 lb (99.791 kg)  BMI 33.46 kg/m2  SpO2 100% CONSTITUTIONAL: Alert and oriented and responds appropriately to questions. Well-appearing; well-nourished HEAD: Normocephalic EYES: Conjunctivae clear, PERRL ENT: normal nose; no rhinorrhea; moist mucous membranes; pharynx without lesions noted NECK: Supple, no meningismus, no LAD  CARD: RRR; S1 and S2 appreciated; no murmurs, no clicks, no rubs, no gallops RESP: Normal chest excursion without splinting or tachypnea; breath sounds clear and equal bilaterally; no wheezes, no rhonchi,  no rales,  ABD/GI: Normal bowel sounds; non-distended; soft, non-tender, no rebound, no guarding BACK:  The back appears normal and is non-tender to palpation, there is no CVA tenderness EXT: Normal ROM in all joints; non-tender to palpation; no edema; normal capillary refill; no cyanosis    SKIN: Normal color for age and race; warm; multiple abrasions to the anterior chest wall with a puncture wound to the center chest that is superficial no more than 3 mm deep, multiple abrasions to the left lower abdomen NEURO: Moves all extremities equally; sensation to light touch intact  diffusely, cranial nerves II through XII intact PSYCH: The patient's mood and manner are appropriate. Grooming and personal hygiene are appropriate.  MEDICAL DECISION MAKING: Patient here after he was bit by his vaccinated dog. Will update patient's tetanus and discharged on Augmentin. His serum glucose is 306. He is not having any systemic symptoms. He states he has not taken his metformin or glipizide today. He plans to take these medications at home. Have advised him that his family also needs to be seen by a physician and be on antibiotics for their dog bites as well. Have discussed with him supportive care instructions including wound care using warm soap and water, Neosporin several times daily. Discussed with him that I will not be suturing it this small puncture wound given the risk of infection is high. Low concern for rabies exposure given patient's dog is vaccinated. Discussed with him return precautions. He verbalizes understanding and is comfortable with plan.       Layla Maw Starasia Sinko, DO 01/02/14 [phone]

## 2014-09-26 ENCOUNTER — Encounter (HOSPITAL_COMMUNITY): Payer: Self-pay | Admitting: Emergency Medicine

## 2014-09-26 ENCOUNTER — Emergency Department (HOSPITAL_COMMUNITY)
Admission: EM | Admit: 2014-09-26 | Discharge: 2014-09-26 | Disposition: A | Discharge status: Home / Self Care | Payer: 59 | Attending: Emergency Medicine | Admitting: Emergency Medicine

## 2014-09-26 DIAGNOSIS — R197 Diarrhea, unspecified: Secondary | ICD-10-CM | POA: Diagnosis not present

## 2014-09-26 DIAGNOSIS — I10 Essential (primary) hypertension: Secondary | ICD-10-CM | POA: Insufficient documentation

## 2014-09-26 DIAGNOSIS — R1110 Vomiting, unspecified: Secondary | ICD-10-CM | POA: Diagnosis present

## 2014-09-26 DIAGNOSIS — R112 Nausea with vomiting, unspecified: Secondary | ICD-10-CM | POA: Insufficient documentation

## 2014-09-26 DIAGNOSIS — E119 Type 2 diabetes mellitus without complications: Secondary | ICD-10-CM | POA: Insufficient documentation

## 2014-09-26 DIAGNOSIS — Z87891 Personal history of nicotine dependence: Secondary | ICD-10-CM | POA: Diagnosis not present

## 2014-09-26 DIAGNOSIS — Z79899 Other long term (current) drug therapy: Secondary | ICD-10-CM | POA: Insufficient documentation

## 2014-09-26 DIAGNOSIS — Z9119 Patient's noncompliance with other medical treatment and regimen: Secondary | ICD-10-CM | POA: Diagnosis not present

## 2014-09-26 LAB — BASIC METABOLIC PANEL
Anion gap: 9 (ref 5–15)
BUN: 10 mg/dL (ref 6–20)
CALCIUM: 8.9 mg/dL (ref 8.9–10.3)
CO2: 28 mmol/L (ref 22–32)
Chloride: 98 mmol/L — ABNORMAL LOW (ref 101–111)
Creatinine, Ser: 0.85 mg/dL (ref 0.61–1.24)
GFR calc Af Amer: >60 mL/min (ref >60)
Glucose, Bld: 275 mg/dL — ABNORMAL HIGH (ref 65–99)
POTASSIUM: 4.4 mmol/L (ref 3.5–5.1)
SODIUM: 135 mmol/L (ref 135–145)

## 2014-09-26 LAB — CBG MONITORING, ED: GLUCOSE-CAPILLARY: 225 mg/dL — AB (ref 65–99)

## 2014-09-26 LAB — CBC WITH DIFFERENTIAL/PLATELET
BASOS ABS: 0.1 10*3/uL (ref 0.0–0.1)
BASOS PCT: 1 % (ref 0–1)
EOS PCT: 2 % (ref 0–5)
Eosinophils Absolute: 0.1 10*3/uL (ref 0.0–0.7)
HCT: 42.5 % (ref 39.0–52.0)
Hemoglobin: 14 g/dL (ref 13.0–17.0)
Lymphocytes Relative: 39 % (ref 12–46)
Lymphs Abs: 2.2 10*3/uL (ref 0.7–4.0)
MCH: 27.2 pg (ref 26.0–34.0)
MCHC: 32.9 g/dL (ref 30.0–36.0)
MCV: 82.5 fL (ref 78.0–100.0)
Monocytes Absolute: 0.5 10*3/uL (ref 0.1–1.0)
Monocytes Relative: 9 % (ref 3–12)
Neutro Abs: 2.9 10*3/uL (ref 1.7–7.7)
Neutrophils Relative %: 49 % (ref 43–77)
PLATELETS: 253 10*3/uL (ref 150–400)
RBC: 5.15 MIL/uL (ref 4.22–5.81)
RDW: 12.7 % (ref 11.5–15.5)
WBC: 5.7 10*3/uL (ref 4.0–10.5)

## 2014-09-26 LAB — I-STAT VENOUS BLOOD GAS, ED
BICARBONATE: 27 meq/L — AB (ref 20.0–24.0)
O2 SAT: 55 %
PO2 VEN: 32 mmHg (ref 30.0–45.0)
TCO2: 29 mmol/L (ref 0–100)
pCO2, Ven: 52.6 mmHg — ABNORMAL HIGH (ref 45.0–50.0)
pH, Ven: 7.319 — ABNORMAL HIGH (ref 7.250–7.300)

## 2014-09-26 LAB — I-STAT CHEM 8, ED
BUN: 13 mg/dL (ref 6–20)
CALCIUM ION: 1.16 mmol/L (ref 1.12–1.23)
CREATININE: 0.8 mg/dL (ref 0.61–1.24)
Chloride: 98 mmol/L — ABNORMAL LOW (ref 101–111)
GLUCOSE: 289 mg/dL — AB (ref 65–99)
HCT: 47 % (ref 39.0–52.0)
HEMOGLOBIN: 16 g/dL (ref 13.0–17.0)
POTASSIUM: 4.2 mmol/L (ref 3.5–5.1)
Sodium: 136 mmol/L (ref 135–145)
TCO2: 27 mmol/L (ref 0–100)

## 2014-09-26 LAB — LACTIC ACID, PLASMA: LACTIC ACID, VENOUS: 1.1 mmol/L (ref 0.5–2.0)

## 2014-09-26 MED ORDER — ONDANSETRON HCL 4 MG/2ML IJ SOLN
4.0000 mg | Freq: Once | INTRAMUSCULAR | Status: AC
  Administered 2014-09-26: 4 mg via INTRAVENOUS
  Filled 2014-09-26: qty 2

## 2014-09-26 MED ORDER — ONDANSETRON 4 MG PO TBDP: ORAL_TABLET | ORAL | Status: DC

## 2014-09-26 MED ORDER — SODIUM CHLORIDE 0.9 % IV BOLUS (SEPSIS)
1000.0000 mL | Freq: Once | INTRAVENOUS | Status: AC
  Administered 2014-09-26: 1000 mL via INTRAVENOUS

## 2014-09-26 MED ORDER — SODIUM CHLORIDE 0.9 % IV BOLUS (SEPSIS)
1000.0000 mL | Freq: Once | INTRAVENOUS | Status: AC
Start: 2014-09-26 — End: 2014-09-26
  Administered 2014-09-26: 1000 mL via INTRAVENOUS

## 2014-09-26 NOTE — ED Provider Notes (Signed)
CSN: [id number]     Arrival date & time 09/26/14  [id number] History   First MD Initiated Contact with Patient 09/26/14 [phone]     Chief Complaint  Patient presents with  . Medication Refill  . Emesis     (Consider location/radiation/quality/duration/timing/severity/associated sxs/prior Treatment) Patient is a 34 y.o. male presenting with vomiting.  Emesis Severity:  Moderate Duration:  3 days Timing:  Intermittent Quality:  Stomach contents Progression:  Unchanged Chronicity:  New Recent urination:  Increased Relieved by:  Nothing Worsened by:  Nothing tried Ineffective treatments:  None tried Associated symptoms: diarrhea   Associated symptoms comment:  Polyuria   Past Medical History  Diagnosis Date  . Diabetes mellitus   . Hypertension    History reviewed. No pertinent past surgical history. Family History  Problem Relation Age of Onset  . Hypertension Mother   . Diabetes Father   . Hypertension Father    History  Substance Use Topics  . Smoking status: Former Smoker    Types: Cigarettes    Quit date: 12/26/2011  . Smokeless tobacco: Not on file  . Alcohol Use: No    Review of Systems  Gastrointestinal: Positive for vomiting and diarrhea.  All other systems reviewed and are negative.     Allergies  Review of patient's allergies indicates no known allergies.  Home Medications   Prior to Admission medications   Medication Sig Start Date End Date Taking? Authorizing Provider  glipiZIDE (GLUCOTROL XL) 10 MG 24 hr tablet Take 10 mg by mouth daily with breakfast.   Yes Historical Provider, MD  linagliptin (TRADJENTA) 5 MG TABS tablet Take 5 mg by mouth daily.   Yes Historical Provider, MD  acetaminophen (TYLENOL) 500 MG tablet Take 500 mg by mouth every 6 (six) hours as needed for moderate pain.    Historical Provider, MD  amoxicillin-clavulanate (AUGMENTIN) 875-125 MG per tablet Take 1 tablet by mouth every 12 (twelve) hours. Patient not taking: Reported on  09/26/2014 01/02/14   Layla MawKristen N Ward, DO  cyclobenzaprine (FLEXERIL) 10 MG tablet Take 10 mg by mouth 2 (two) times daily as needed for muscle spasms.    Historical Provider, MD  enalapril (VASOTEC) 20 MG tablet Take 10 mg by mouth daily. 04/15/12 04/15/13  Elson AreasLeslie K Sofia, PA-C  ibuprofen (ADVIL,MOTRIN) 800 MG tablet Take 1 tablet (800 mg total) by mouth every 8 (eight) hours as needed for mild pain. Patient not taking: Reported on 09/26/2014 01/02/14   Kristen N Ward, DO  lisinopril (PRINIVIL,ZESTRIL) 10 MG tablet Take 10 mg by mouth daily.    Historical Provider, MD  metFORMIN (GLUCOPHAGE) 850 MG tablet Take 850 mg by mouth 2 (two) times daily with a meal. 04/15/12 04/15/13  Elson AreasLeslie K Sofia, PA-C  ondansetron (ZOFRAN ODT) 4 MG disintegrating tablet 4mg  ODT q4 hours prn nausea/vomit 09/26/14   Mirian MoMatthew Gentry, MD  traMADol (ULTRAM) 50 MG tablet Take 50 mg by mouth every 6 (six) hours as needed for moderate pain. 10/14/13   Robyn M Hess, PA-C   BP 151/97 mmHg  Pulse 73  Temp(Src) 97.4 F (36.3 C) (Oral)  Resp 14  Ht 5\' 8"  (1.727 m)  Wt 209 lb (94.802 kg)  BMI 31.79 kg/m2  SpO2 97% Physical Exam  Constitutional: He is oriented to person, place, and time. He appears well-developed and well-nourished.  HENT:  Head: Normocephalic and atraumatic.  Eyes: Conjunctivae and EOM are normal.  Neck: Normal range of motion. Neck supple.  Cardiovascular: Normal rate, regular rhythm and  normal heart sounds.   Pulmonary/Chest: Effort normal and breath sounds normal. No respiratory distress.  Abdominal: He exhibits no distension. There is no tenderness. There is no rebound and no guarding.  Musculoskeletal: Normal range of motion.  Neurological: He is alert and oriented to person, place, and time.  Skin: Skin is warm and dry.  Vitals reviewed.   ED Course  Procedures (including critical care time) Labs Review Labs Reviewed  BASIC METABOLIC PANEL - Abnormal; Notable for the following:    Chloride 98 (*)     Glucose, Bld 275 (*)    All other components within normal limits  CBG MONITORING, ED - Abnormal; Notable for the following:    Glucose-Capillary 225 (*)    All other components within normal limits  I-STAT CHEM 8, ED - Abnormal; Notable for the following:    Chloride 98 (*)    Glucose, Bld 289 (*)    All other components within normal limits  I-STAT VENOUS BLOOD GAS, ED - Abnormal; Notable for the following:    pH, Ven 7.319 (*)    pCO2, Ven 52.6 (*)    Bicarbonate 27.0 (*)    All other components within normal limits  LACTIC ACID, PLASMA  CBC WITH DIFFERENTIAL/PLATELET  LACTIC ACID, PLASMA  BLOOD GAS, VENOUS    Imaging Review No results found.   EKG Interpretation None      MDM   Final diagnoses:  Vomiting and diarrhea    34 y.o. male with pertinent PMH of DM, HTN presents with nausea, vomiting, diarrhea 3 days with polyuria in setting of medical noncompliance x 1 week. Patient states that he switched insurance and did not have the money to obtain his medication. On arrival today patient has vital signs and physical exam as above. He states he feels well, is not currently nauseous. Initial glucose here 225.  Wu unremarkable.  Likely gastroenteritis given concurrent diarrhea.  Pt has glipizide at home.  He will fill medications on Friday.  DC home with standard return precautions, zofran.  I have reviewed all laboratory and imaging studies if ordered as above  1. Vomiting and diarrhea         Mirian MoMatthew Gentry, MD 09/26/14 [phone]

## 2014-09-26 NOTE — ED Notes (Signed)
Patient here with complaint of polydipsia, polyuria, headache, nausea, dizziness. States that most symptoms started about 1 week ago after being unable to get his medications. Lisinopril, glipizide, and Trajenda. Blood glucose here is 225.

## 2014-09-26 NOTE — Discharge Instructions (Signed)

## 2014-09-26 NOTE — ED Notes (Signed)
Lab results of I-Stat G3  Given to Nurse Erin.

## 2015-08-31 ENCOUNTER — Encounter (HOSPITAL_COMMUNITY): Payer: Self-pay | Admitting: Vascular Surgery

## 2015-08-31 ENCOUNTER — Emergency Department (HOSPITAL_COMMUNITY): Payer: BLUE CROSS/BLUE SHIELD

## 2015-08-31 ENCOUNTER — Observation Stay (HOSPITAL_COMMUNITY)
Admission: EM | Admit: 2015-08-31 | Discharge: 2015-09-02 | Disposition: A | Discharge status: Home / Self Care | Payer: BLUE CROSS/BLUE SHIELD | Attending: Internal Medicine | Admitting: Internal Medicine

## 2015-08-31 DIAGNOSIS — E1169 Type 2 diabetes mellitus with other specified complication: Secondary | ICD-10-CM

## 2015-08-31 DIAGNOSIS — Z794 Long term (current) use of insulin: Secondary | ICD-10-CM | POA: Diagnosis present

## 2015-08-31 DIAGNOSIS — R61 Generalized hyperhidrosis: Secondary | ICD-10-CM | POA: Diagnosis not present

## 2015-08-31 DIAGNOSIS — Z79899 Other long term (current) drug therapy: Secondary | ICD-10-CM | POA: Diagnosis not present

## 2015-08-31 DIAGNOSIS — R112 Nausea with vomiting, unspecified: Secondary | ICD-10-CM | POA: Diagnosis present

## 2015-08-31 DIAGNOSIS — Z91148 Patient's other noncompliance with medication regimen for other reason: Secondary | ICD-10-CM

## 2015-08-31 DIAGNOSIS — Z87891 Personal history of nicotine dependence: Secondary | ICD-10-CM | POA: Insufficient documentation

## 2015-08-31 DIAGNOSIS — Z9114 Patient's other noncompliance with medication regimen: Secondary | ICD-10-CM

## 2015-08-31 DIAGNOSIS — I10 Essential (primary) hypertension: Secondary | ICD-10-CM | POA: Diagnosis not present

## 2015-08-31 DIAGNOSIS — I160 Hypertensive urgency: Secondary | ICD-10-CM | POA: Diagnosis present

## 2015-08-31 DIAGNOSIS — E1165 Type 2 diabetes mellitus with hyperglycemia: Secondary | ICD-10-CM | POA: Diagnosis present

## 2015-08-31 DIAGNOSIS — G43A1 Cyclical vomiting, intractable: Secondary | ICD-10-CM | POA: Diagnosis not present

## 2015-08-31 DIAGNOSIS — F1210 Cannabis abuse, uncomplicated: Secondary | ICD-10-CM | POA: Diagnosis present

## 2015-08-31 DIAGNOSIS — IMO0002 Reserved for concepts with insufficient information to code with codable children: Secondary | ICD-10-CM

## 2015-08-31 DIAGNOSIS — E119 Type 2 diabetes mellitus without complications: Secondary | ICD-10-CM | POA: Insufficient documentation

## 2015-08-31 DIAGNOSIS — R1110 Vomiting, unspecified: Secondary | ICD-10-CM

## 2015-08-31 HISTORY — DX: Type 2 diabetes mellitus without complications: E11.9

## 2015-08-31 LAB — URINALYSIS, ROUTINE W REFLEX MICROSCOPIC
Bilirubin Urine: NEGATIVE
HGB URINE DIPSTICK: NEGATIVE
Ketones, ur: >80 mg/dL — AB
Leukocytes, UA: NEGATIVE
Nitrite: NEGATIVE
PH: 6.5 (ref 5.0–8.0)
Protein, ur: 30 mg/dL — AB
SPECIFIC GRAVITY, URINE: 1.037 — AB (ref 1.005–1.030)

## 2015-08-31 LAB — URINE MICROSCOPIC-ADD ON: Bacteria, UA: NONE SEEN

## 2015-08-31 LAB — COMPREHENSIVE METABOLIC PANEL
ALT: 28 U/L (ref 17–63)
AST: 35 U/L (ref 15–41)
Albumin: 3.9 g/dL (ref 3.5–5.0)
Alkaline Phosphatase: 85 U/L (ref 38–126)
Anion gap: 13 (ref 5–15)
BUN: 8 mg/dL (ref 6–20)
CHLORIDE: 100 mmol/L — AB (ref 101–111)
CO2: 23 mmol/L (ref 22–32)
CREATININE: 0.91 mg/dL (ref 0.61–1.24)
Calcium: 9.1 mg/dL (ref 8.9–10.3)
GFR calc Af Amer: >60 mL/min (ref >60)
GFR calc non Af Amer: >60 mL/min (ref >60)
GLUCOSE: 325 mg/dL — AB (ref 65–99)
Potassium: 5.2 mmol/L — ABNORMAL HIGH (ref 3.5–5.1)
SODIUM: 136 mmol/L (ref 135–145)
Total Bilirubin: 1.6 mg/dL — ABNORMAL HIGH (ref 0.3–1.2)
Total Protein: 7.5 g/dL (ref 6.5–8.1)

## 2015-08-31 LAB — I-STAT CHEM 8, ED
BUN: 13 mg/dL (ref 6–20)
CALCIUM ION: 1.08 mmol/L — AB (ref 1.12–1.23)
CHLORIDE: 99 mmol/L — AB (ref 101–111)
Creatinine, Ser: 0.7 mg/dL (ref 0.61–1.24)
Glucose, Bld: 320 mg/dL — ABNORMAL HIGH (ref 65–99)
HCT: 48 % (ref 39.0–52.0)
Hemoglobin: 16.3 g/dL (ref 13.0–17.0)
Potassium: 5.1 mmol/L (ref 3.5–5.1)
SODIUM: 136 mmol/L (ref 135–145)
TCO2: 27 mmol/L (ref 0–100)

## 2015-08-31 LAB — GLUCOSE, CAPILLARY: Glucose-Capillary: 300 mg/dL — ABNORMAL HIGH (ref 65–99)

## 2015-08-31 LAB — CBC WITH DIFFERENTIAL/PLATELET
Basophils Absolute: 0.1 10*3/uL (ref 0.0–0.1)
Basophils Relative: 1 %
EOS ABS: 0 10*3/uL (ref 0.0–0.7)
Eosinophils Relative: 0 %
HCT: 41.8 % (ref 39.0–52.0)
HEMOGLOBIN: 14.1 g/dL (ref 13.0–17.0)
LYMPHS PCT: 15 %
Lymphs Abs: 1.5 10*3/uL (ref 0.7–4.0)
MCH: 27.4 pg (ref 26.0–34.0)
MCHC: 33.7 g/dL (ref 30.0–36.0)
MCV: 81.3 fL (ref 78.0–100.0)
MONO ABS: 0.4 10*3/uL (ref 0.1–1.0)
Monocytes Relative: 4 %
NEUTROS PCT: 80 %
Neutro Abs: 7.9 10*3/uL — ABNORMAL HIGH (ref 1.7–7.7)
PLATELETS: 212 10*3/uL (ref 150–400)
RBC: 5.14 MIL/uL (ref 4.22–5.81)
RDW: 12.5 % (ref 11.5–15.5)
WBC: 9.9 10*3/uL (ref 4.0–10.5)

## 2015-08-31 LAB — I-STAT VENOUS BLOOD GAS, ED
Acid-base deficit: 1 mmol/L (ref 0.0–2.0)
BICARBONATE: 27.4 meq/L — AB (ref 20.0–24.0)
O2 Saturation: 42 %
PH VEN: 7.274 (ref 7.250–7.300)
PO2 VEN: 27 mmHg — AB (ref 31.0–45.0)
TCO2: 29 mmol/L (ref 0–100)
pCO2, Ven: 59.1 mmHg — ABNORMAL HIGH (ref 45.0–50.0)

## 2015-08-31 LAB — RAPID URINE DRUG SCREEN, HOSP PERFORMED
Amphetamines: NOT DETECTED
BARBITURATES: NOT DETECTED
BENZODIAZEPINES: NOT DETECTED
COCAINE: NOT DETECTED
Opiates: NOT DETECTED
TETRAHYDROCANNABINOL: POSITIVE — AB

## 2015-08-31 LAB — CREATININE, SERUM
CREATININE: 0.8 mg/dL (ref 0.61–1.24)
GFR calc Af Amer: >60 mL/min (ref >60)

## 2015-08-31 LAB — CBC
HEMATOCRIT: 41.8 % (ref 39.0–52.0)
Hemoglobin: 13.9 g/dL (ref 13.0–17.0)
MCH: 27.4 pg (ref 26.0–34.0)
MCHC: 33.3 g/dL (ref 30.0–36.0)
MCV: 82.4 fL (ref 78.0–100.0)
PLATELETS: 227 10*3/uL (ref 150–400)
RBC: 5.07 MIL/uL (ref 4.22–5.81)
RDW: 12.7 % (ref 11.5–15.5)
WBC: 9.8 10*3/uL (ref 4.0–10.5)

## 2015-08-31 LAB — I-STAT TROPONIN, ED: TROPONIN I, POC: 0 ng/mL (ref 0.00–0.08)

## 2015-08-31 LAB — TROPONIN I: Troponin I: <0.03 ng/mL (ref <0.031)

## 2015-08-31 LAB — CBG MONITORING, ED: Glucose-Capillary: 233 mg/dL — ABNORMAL HIGH (ref 65–99)

## 2015-08-31 LAB — LIPASE, BLOOD: Lipase: 42 U/L (ref 11–51)

## 2015-08-31 MED ORDER — TETRACAINE HCL 0.5 % OP SOLN
1.0000 [drp] | Freq: Once | OPHTHALMIC | Status: AC
  Administered 2015-08-31: 1 [drp] via OPHTHALMIC
  Filled 2015-08-31: qty 2

## 2015-08-31 MED ORDER — METOCLOPRAMIDE HCL 5 MG/ML IJ SOLN
10.0000 mg | Freq: Once | INTRAMUSCULAR | Status: AC
  Administered 2015-08-31: 10 mg via INTRAVENOUS
  Filled 2015-08-31: qty 2

## 2015-08-31 MED ORDER — FENTANYL CITRATE (PF) 100 MCG/2ML IJ SOLN
100.0000 ug | Freq: Once | INTRAMUSCULAR | Status: AC
  Administered 2015-08-31: 100 ug via INTRAVENOUS
  Filled 2015-08-31: qty 2

## 2015-08-31 MED ORDER — ONDANSETRON HCL 4 MG PO TABS: 4.0000 mg | ORAL_TABLET | Freq: Four times a day (QID) | ORAL | Status: DC | PRN

## 2015-08-31 MED ORDER — ONDANSETRON HCL 4 MG/2ML IJ SOLN
4.0000 mg | Freq: Once | INTRAMUSCULAR | Status: AC
  Administered 2015-08-31: 4 mg via INTRAVENOUS
  Filled 2015-08-31: qty 2

## 2015-08-31 MED ORDER — INSULIN ASPART 100 UNIT/ML ~~LOC~~ SOLN: 4.0000 [IU] | Freq: Three times a day (TID) | SUBCUTANEOUS | Status: DC

## 2015-08-31 MED ORDER — SODIUM CHLORIDE 0.9% FLUSH
3.0000 mL | Freq: Two times a day (BID) | INTRAVENOUS | Status: DC
  Administered 2015-09-01 (×2): 3 mL via INTRAVENOUS

## 2015-08-31 MED ORDER — POLYETHYLENE GLYCOL 3350 17 G PO PACK: 17.0000 g | PACK | Freq: Every day | ORAL | Status: DC | PRN

## 2015-08-31 MED ORDER — HYDRALAZINE HCL 20 MG/ML IJ SOLN: 10.0000 mg | INTRAMUSCULAR | Status: AC

## 2015-08-31 MED ORDER — GI COCKTAIL ~~LOC~~
30.0000 mL | Freq: Once | ORAL | Status: AC
  Administered 2015-08-31: 30 mL via ORAL
  Filled 2015-08-31: qty 30

## 2015-08-31 MED ORDER — SODIUM CHLORIDE 0.9 % IV BOLUS (SEPSIS)
1000.0000 mL | Freq: Once | INTRAVENOUS | Status: AC
  Administered 2015-08-31: 1000 mL via INTRAVENOUS

## 2015-08-31 MED ORDER — ONDANSETRON HCL 4 MG/2ML IJ SOLN: 4.0000 mg | Freq: Four times a day (QID) | INTRAMUSCULAR | Status: DC | PRN

## 2015-08-31 MED ORDER — HYDRALAZINE HCL 20 MG/ML IJ SOLN: 10.0000 mg | Freq: Four times a day (QID) | INTRAMUSCULAR | Status: DC | PRN

## 2015-08-31 MED ORDER — DIPHENHYDRAMINE HCL 50 MG/ML IJ SOLN
25.0000 mg | Freq: Once | INTRAMUSCULAR | Status: AC
  Administered 2015-08-31: 25 mg via INTRAVENOUS
  Filled 2015-08-31: qty 1

## 2015-08-31 MED ORDER — ACETAMINOPHEN 325 MG PO TABS
650.0000 mg | ORAL_TABLET | Freq: Four times a day (QID) | ORAL | Status: DC | PRN
Start: 2015-08-31 — End: 2015-09-02

## 2015-08-31 MED ORDER — LABETALOL HCL 5 MG/ML IV SOLN
10.0000 mg | Freq: Four times a day (QID) | INTRAVENOUS | Status: DC
  Administered 2015-08-31 – 2015-09-01 (×3): 10 mg via INTRAVENOUS
  Filled 2015-08-31 (×3): qty 4

## 2015-08-31 MED ORDER — ACETAMINOPHEN 650 MG RE SUPP: 650.0000 mg | Freq: Four times a day (QID) | RECTAL | Status: DC | PRN

## 2015-08-31 MED ORDER — INSULIN ASPART 100 UNIT/ML ~~LOC~~ SOLN
0.0000 [IU] | Freq: Every day | SUBCUTANEOUS | Status: DC
  Administered 2015-08-31: 3 [IU] via SUBCUTANEOUS
  Administered 2015-09-01: 2 [IU] via SUBCUTANEOUS

## 2015-08-31 MED ORDER — SODIUM CHLORIDE 0.9% FLUSH: 3.0000 mL | INTRAVENOUS | Status: DC | PRN

## 2015-08-31 MED ORDER — LISINOPRIL 10 MG PO TABS
10.0000 mg | ORAL_TABLET | Freq: Once | ORAL | Status: AC
  Administered 2015-08-31: 10 mg via ORAL
  Filled 2015-08-31: qty 1

## 2015-08-31 MED ORDER — INSULIN ASPART 100 UNIT/ML ~~LOC~~ SOLN
0.0000 [IU] | Freq: Three times a day (TID) | SUBCUTANEOUS | Status: DC
  Administered 2015-09-01: 8 [IU] via SUBCUTANEOUS
  Administered 2015-09-01: 11 [IU] via SUBCUTANEOUS
  Administered 2015-09-02: 5 [IU] via SUBCUTANEOUS
  Administered 2015-09-02: 3 [IU] via SUBCUTANEOUS

## 2015-08-31 MED ORDER — FENTANYL CITRATE (PF) 100 MCG/2ML IJ SOLN
50.0000 ug | Freq: Once | INTRAMUSCULAR | Status: AC
  Administered 2015-08-31: 50 ug via INTRAVENOUS
  Filled 2015-08-31: qty 2

## 2015-08-31 MED ORDER — HYDRALAZINE HCL 20 MG/ML IJ SOLN
10.0000 mg | Freq: Four times a day (QID) | INTRAMUSCULAR | Status: DC | PRN
Start: 2015-08-31 — End: 2015-08-31
  Filled 2015-08-31: qty 1

## 2015-08-31 MED ORDER — SODIUM CHLORIDE 0.9 % IV SOLN: 250.0000 mL | INTRAVENOUS | Status: DC | PRN

## 2015-08-31 MED ORDER — PROCHLORPERAZINE EDISYLATE 5 MG/ML IJ SOLN
10.0000 mg | Freq: Once | INTRAMUSCULAR | Status: AC
  Administered 2015-08-31: 10 mg via INTRAVENOUS
  Filled 2015-08-31: qty 2

## 2015-08-31 MED ORDER — ENOXAPARIN SODIUM 40 MG/0.4ML ~~LOC~~ SOLN
40.0000 mg | SUBCUTANEOUS | Status: DC
  Filled 2015-08-31 (×2): qty 0.4

## 2015-08-31 MED ORDER — SODIUM CHLORIDE 0.9% FLUSH
3.0000 mL | Freq: Two times a day (BID) | INTRAVENOUS | Status: DC
  Administered 2015-09-01: 3 mL via INTRAVENOUS

## 2015-08-31 NOTE — ED Notes (Signed)
PA aware of patient BP 

## 2015-08-31 NOTE — ED Notes (Signed)
Pt reports to the ED for eval of HA, HTN, and hyperglycemia. Reports he had a HA today but it became much worse today. Pt has been out of his BP and diabetic medications x 3 days. Pt reports he also developed some nausea, dry heaves, and diaphoresis today. States pain is worse behind his left eye. Denies any vision changes, CP, or SOB. Some EKG abnormalities noted en route. EKG obtained on arrival and shown to Dr. Lynelle DoctorKnapp. Pt A&Ox4, resp e/u, and skin warm and dry.

## 2015-08-31 NOTE — ED Provider Notes (Signed)
CSN: [id number]     Arrival date & time 08/31/15  1107 History   First MD Initiated Contact with Patient 08/31/15 1113     Chief Complaint  Patient presents with  . Hypertension  . Headache     (Consider location/radiation/quality/duration/timing/severity/associated sxs/prior Treatment) The history is provided by the patient and the EMS personnel. The history is limited by the condition of the patient. No language interpreter was used.     Randy [name] is a 35 year old male with history of diabetes and hypertension, who presents to emergency department via EMS with complaints of nausea, vomiting, diaphoresis with generalized abdominal pain and left eye pain with associated photophobia.  He has been out of his blood pressure and diabetes medications for 3 days, upon EMS arrival systolic blood pressure is 114, and he began active vomiting in route, continuing to actively vomiting and dry heave in the ER.   Patient states that he had abdominal pain yesterday, generalized, without radiation. He denies surgical history of his abdomen. He denies urinary complaints, bowel changes, hematemesis, emesis is described as bilious.  He denies chest pain, shortness of breath, syncope or dizziness.  Patient states he has left eye pain sensitivity to light he denies blurry vision, eye trauma, headache, loss of consciousness.    Level V caveat, history limited due to patient condition  Past Medical History  Diagnosis Date  . Diabetes mellitus   . Hypertension    History reviewed. No pertinent past surgical history. Family History  Problem Relation Age of Onset  . Hypertension Mother   . Diabetes Father   . Hypertension Father    Social History  Substance Use Topics  . Smoking status: Former Smoker    Types: Cigarettes    Quit date: 12/26/2011  . Smokeless tobacco: None  . Alcohol Use: No    Review of Systems  All other systems reviewed and are negative.     Allergies  Review of  patient's allergies indicates no known allergies.  Home Medications   Prior to Admission medications   Medication Sig Start Date End Date Taking? Authorizing Provider  enalapril (VASOTEC) 20 MG tablet Take 10 mg by mouth daily. 04/15/12 08/30/16 Yes Lonia SkinnerLeslie K Sofia, PA-C  glipiZIDE (GLUCOTROL XL) 10 MG 24 hr tablet Take 10 mg by mouth daily with breakfast.   Yes Historical Provider, MD  linagliptin (TRADJENTA) 5 MG TABS tablet Take 5 mg by mouth daily.   Yes Historical Provider, MD  lisinopril (PRINIVIL,ZESTRIL) 10 MG tablet Take 10 mg by mouth daily.   Yes Historical Provider, MD  ondansetron (ZOFRAN ODT) 4 MG disintegrating tablet 4mg  ODT q4 hours prn nausea/vomit Patient not taking: Reported on 08/31/2015 09/26/14   Mirian MoMatthew Gentry, MD   BP 175/92 mmHg  Pulse 86  Temp(Src) 97.4 F (36.3 C) (Oral)  Resp 16  SpO2 100% Physical Exam  Constitutional: He is oriented to person, place, and time. He appears well-developed and well-nourished. He appears distressed.  HENT:  Head: Normocephalic and atraumatic.  Eyes: Conjunctivae and EOM are normal. Pupils are equal, round, and reactive to light. Right eye exhibits no discharge. Left eye exhibits no discharge.  IOP right: 20; left: 23  Neck: Normal range of motion. Neck supple.  Cardiovascular: Normal rate, regular rhythm, normal heart sounds and intact distal pulses.   Pulmonary/Chest: Effort normal and breath sounds normal. No stridor. No respiratory distress. He has no wheezes. He has no rales. He exhibits no tenderness.  Abdominal: Soft. Bowel sounds  are normal. He exhibits no distension. There is tenderness. There is no rebound and no guarding.  Epigastric tenderness  Musculoskeletal: Normal range of motion.  Neurological: He is alert and oriented to person, place, and time. He exhibits normal muscle tone. Coordination normal.  Skin: Skin is warm. No rash noted. He is diaphoretic. No erythema.  Psychiatric: He has a normal mood and affect.  His behavior is normal.    ED Course  Procedures (including critical care time) Labs Review Labs Reviewed  CBC WITH DIFFERENTIAL/PLATELET - Abnormal; Notable for the following:    Neutro Abs 7.9 (*)    All other components within normal limits  COMPREHENSIVE METABOLIC PANEL - Abnormal; Notable for the following:    Potassium 5.2 (*)    Chloride 100 (*)    Glucose, Bld 325 (*)    Total Bilirubin 1.6 (*)    All other components within normal limits  I-STAT CHEM 8, ED - Abnormal; Notable for the following:    Chloride 99 (*)    Glucose, Bld 320 (*)    Calcium, Ion 1.08 (*)    All other components within normal limits  I-STAT VENOUS BLOOD GAS, ED - Abnormal; Notable for the following:    pCO2, Ven 59.1 (*)    pO2, Ven 27.0 (*)    Bicarbonate 27.4 (*)    All other components within normal limits  LIPASE, BLOOD  URINALYSIS, ROUTINE W REFLEX MICROSCOPIC (NOT AT [facility])  CBG MONITORING, ED  I-STAT TROPOININ, ED    Imaging Review Ct Head Wo Contrast  08/31/2015  CLINICAL DATA:  Headache and left eye pain beginning today. No known injury. Initial encounter. EXAM: CT HEAD WITHOUT CONTRAST TECHNIQUE: Contiguous axial images were obtained from the base of the skull through the vertex without intravenous contrast. COMPARISON:  None. FINDINGS: The brain appears normal without hemorrhage, infarct, mass lesion, mass effect, midline shift or abnormal extra-axial fluid collection. No hydrocephalus or pneumocephalus. 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 The calvarium is intact. Imaged paranasal sinuses and mastoid air cells are clear. IMPRESSION: Negative head CT. Electronically Signed   By: Drusilla Kanner M.D.   On: 08/31/2015 13:54   I have personally reviewed and evaluated these images and lab results as part of my medical decision-making.   EKG Interpretation   Date/Time:  Thursday August 31 2015 11:10:09 EDT Ventricular Rate:  64 PR Interval:  181 QRS Duration: 84 QT Interval:   409 QTC Calculation: 422 R Axis:   78 Text Interpretation:  Sinus rhythm Left ventricular hypertrophy ST elev,  probable normal early repol pattern t wave amplitude is increased since  prior tracing Confirmed by KNAPP  MD-J, JON ([id number]) on 08/31/2015 11:17:52  AM Also confirmed by KNAPP  MD-J, JON [id number]), editor [place] CT, Jola Babinski  [phone])  on 08/31/2015 11:52:07 AM      MDM   Pt with CC of left eye pain, abdominal pain, N, V, diaphoresis, EMS was called and pt was found to be hypertensive to SBP 214 and CBG 344, stated he has been out of his medicines for the past 3 days he was transported to the ER and had persistent nausea and vomiting.  Vomiting was improved after Zofran, Reglan and IV fluids.  Intraocular pressure was tested, within normal limits bilaterally, head CT negative.  Patient's pressures improved to 148/96, he had resolution of his eye pain, abdominal pain and nausea vomiting.  Patient was able to take oral dose of his home blood pressure medication, however  this causes increasing abdominal discomfort and severe nausea, with increasing blood pressure again.  Pt does not appear to have hypertensive emergency, no end- organ damage evident with labs, CT head negative, eye pain improved. Initial troponin is negative. Patient denies any chest pain and shortness of breath.  Potassium within normal limits, EKG with increased T-wave amplitude when compared to prior tracings, otherwise left ventricular hypertrophy, sinus rhythm,  ST elevation in V3-V6 from early re-pole, no reciprocal changes.   Patient continued to have nausea and worsening epigastric pain with attempting PO trial and taking oral home meds in the ER, this is caused re-elevation of his blood pressures.  Case discussed with my attending physician, Dr. Lynelle Doctor, who has personally seen and evaluated the patient, after multiple IV treatments, pt is unable to tolerate his home meds, and requires admission for stabilization of BP,  blood sugars and N/V.    Pt is established with [facility], will call [facility] for admission.   Final diagnoses:  Essential hypertension  Intractable vomiting with nausea, vomiting of unspecified type        Danelle Berry, PA-C 09/03/15 2221  Linwood Dibbles, MD 09/07/15 [phone]

## 2015-08-31 NOTE — ED Notes (Signed)
Patient CBG was 344, The Nurse was informed. 

## 2015-08-31 NOTE — ED Notes (Signed)
PA at bedside.
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
## 2015-08-31 NOTE — Progress Notes (Signed)
Received to [id number] via stretcher from ED. VSS. Denies pain, SOB, N/V or other c/o at this time. Oriented to room and unit. Instructed to call for needs.  Verbalized understanding. Call bell and pt phone in reach.

## 2015-08-31 NOTE — ED Provider Notes (Signed)
She presented to the emergency room with complaints of hypertension, headache, nausea and vomiting. Patient states the pain was primarily behind his left eye. He's had multiple episodes of nausea and vomiting. Denies any trouble with numbness or weakness. No chest pain. Physical Exam  BP 169/123 mmHg  Pulse 72  Temp(Src) 97.4 F (36.3 C) (Oral)  Resp 11  SpO2 100%  Physical Exam  Constitutional: He appears well-developed and well-nourished. He appears distressed.  HENT:  Head: Normocephalic and atraumatic.  Right Ear: External ear normal.  Left Ear: External ear normal.  Eyes: Conjunctivae are normal. Right eye exhibits no discharge. Left eye exhibits no discharge. No scleral icterus.  Neck: Neck supple. No tracheal deviation present.  Cardiovascular: Normal rate.   Pulmonary/Chest: Effort normal. No stridor. No respiratory distress.  Abdominal: He exhibits no distension. There is no tenderness. There is no rebound and no guarding.  Musculoskeletal: He exhibits no edema.  Neurological: He is alert. Cranial nerve deficit: no gross deficits.  Skin: Skin is warm and dry. No rash noted.  Psychiatric: He has a normal mood and affect.  Nursing note and vitals reviewed.   ED Course  Procedures  EKG Interpretation  Date/Time:  Thursday August 31 2015 11:10:09 EDT Ventricular Rate:  64 PR Interval:  181 QRS Duration: 84 QT Interval:  409 QTC Calculation: 422 R Axis:   78 Text Interpretation:  Sinus rhythm Left ventricular hypertrophy ST elev, probable normal early repol pattern t wave amplitude is increased since prior tracing Confirmed by Xandra Laramee  MD-J, Shatasia Cutshaw ([id number]) on 08/31/2015 11:17:52 AM Also confirmed by Agapito Hanway  MD-J, Chenay Nesmith [phone]), editor Stout CT, Jola BabinskiMarilyn [phone]([id number])  on 08/31/2015 11:52:07 AM       MDM Patient was treated with antibiotics and IV fluids. He was also given pain medications and a migraine cocktail. Ocular pressure is not significantly elevated.  Doubt acute glaucoma.  His  headache and nausea improved. Blood pressure was also decreasing. CT scan without bleed or edema. The patient started developing nausea and vomiting again. Abdominal exam is repeated, he does not have any tenderness on my exam at 1600.  Considering the patient's persistent nausea and vomiting and hypertension we will consult the medical service for admission.  Suspect HTN urgency, possible migraine.  Poorly controlled dm without DKA.      Linwood DibblesJon Russell Engelstad, MD 08/31/15 [phone]

## 2015-09-01 DIAGNOSIS — F1210 Cannabis abuse, uncomplicated: Secondary | ICD-10-CM

## 2015-09-01 DIAGNOSIS — Z9114 Patient's other noncompliance with medication regimen: Secondary | ICD-10-CM

## 2015-09-01 DIAGNOSIS — F1994 Other psychoactive substance use, unspecified with psychoactive substance-induced mood disorder: Secondary | ICD-10-CM

## 2015-09-01 LAB — BASIC METABOLIC PANEL
ANION GAP: 11 (ref 5–15)
BUN: 9 mg/dL (ref 6–20)
CHLORIDE: 102 mmol/L (ref 101–111)
CO2: 22 mmol/L (ref 22–32)
CREATININE: 0.87 mg/dL (ref 0.61–1.24)
Calcium: 8.8 mg/dL — ABNORMAL LOW (ref 8.9–10.3)
GFR calc non Af Amer: >60 mL/min (ref >60)
Glucose, Bld: 235 mg/dL — ABNORMAL HIGH (ref 65–99)
Potassium: 3.9 mmol/L (ref 3.5–5.1)
SODIUM: 135 mmol/L (ref 135–145)

## 2015-09-01 LAB — GLUCOSE, CAPILLARY
GLUCOSE-CAPILLARY: 281 mg/dL — AB (ref 65–99)
GLUCOSE-CAPILLARY: 333 mg/dL — AB (ref 65–99)
GLUCOSE-CAPILLARY: 76 mg/dL (ref 65–99)
GLUCOSE-CAPILLARY: 251 mg/dL — AB (ref 65–99)
GLUCOSE-CAPILLARY: 222 mg/dL — AB (ref 65–99)

## 2015-09-01 LAB — HEMOGLOBIN A1C
Hgb A1c MFr Bld: 13.3 % — ABNORMAL HIGH (ref 4.8–5.6)
Mean Plasma Glucose: 335 mg/dL

## 2015-09-01 LAB — TROPONIN I: Troponin I: <0.03 ng/mL (ref <0.031)

## 2015-09-01 MED ORDER — LISINOPRIL 10 MG PO TABS: 10.0000 mg | ORAL_TABLET | Freq: Every day | ORAL | Status: DC

## 2015-09-01 MED ORDER — INSULIN ASPART 100 UNIT/ML ~~LOC~~ SOLN
5.0000 [IU] | Freq: Three times a day (TID) | SUBCUTANEOUS | Status: DC
  Administered 2015-09-01 – 2015-09-02 (×4): 5 [IU] via SUBCUTANEOUS

## 2015-09-01 MED ORDER — INSULIN GLARGINE 100 UNIT/ML ~~LOC~~ SOLN
10.0000 [IU] | Freq: Every day | SUBCUTANEOUS | Status: DC
  Administered 2015-09-01 – 2015-09-02 (×2): 10 [IU] via SUBCUTANEOUS
  Filled 2015-09-01 (×2): qty 0.1

## 2015-09-01 MED ORDER — LIVING WELL WITH DIABETES BOOK
Freq: Once | Status: AC
  Administered 2015-09-01: 10:00:00
  Filled 2015-09-01: qty 1

## 2015-09-01 MED ORDER — LABETALOL HCL 200 MG PO TABS
200.0000 mg | ORAL_TABLET | Freq: Two times a day (BID) | ORAL | Status: DC
  Administered 2015-09-01 – 2015-09-02 (×3): 200 mg via ORAL
  Filled 2015-09-01 (×3): qty 1

## 2015-09-01 MED ORDER — ENALAPRIL MALEATE 5 MG PO TABS
10.0000 mg | ORAL_TABLET | Freq: Every day | ORAL | Status: DC
  Administered 2015-09-01 – 2015-09-02 (×2): 10 mg via ORAL
  Filled 2015-09-01 (×2): qty 2

## 2015-09-01 MED ORDER — INSULIN STARTER KIT- PEN NEEDLES (ENGLISH)
1.0000 | Freq: Once | Status: AC
Start: 2015-09-01 — End: 2015-09-01
  Administered 2015-09-01: 1
  Filled 2015-09-01: qty 1

## 2015-09-01 NOTE — Progress Notes (Addendum)
Inpatient Diabetes Program Recommendations  AACE/ADA: New Consensus Statement on Inpatient Glycemic Control (2015)  Target Ranges:  Prepandial:   less than 140 mg/dL      Peak postprandial:   less than 180 mg/dL (1-2 hours)      Critically ill patients:  140 - 180 mg/dL   Results for Randy [name], Randy [name] (MRN [id number]) as of 09/01/2015 09:12  Ref. Range 08/31/2015 16:10 08/31/2015 20:05 09/01/2015 08:32  Glucose-Capillary Latest Ref Range: 65-99 mg/dL 233 (H) 300 (H) 281 (H)    Admit with: L eye pain/ HTN urgency/ N&V  History: DM2, HTN  Home DM Meds: Glipizide 10 mg daily       Tradjenta 5 mg daily  Current Insulin Orders: Lantus 10 units daily      Novolog Moderate Correction Scale/ SSI (0-15 units) TID AC + HS      Novolog 5 units tidwc      -Note Lantus 10 units daily and Novolog 5 units tidwc started today per Dr. Rockne Menghini.  -Current A1c 13.3% shows extremely poor control at home.  May need insulin for home?    -Note patient was out of medications for 3 days prior to admission and patient's wife stated patient is noncompliant with his medications.  -Spoke with pt about his DM.  Discussed A1C results with patient and his wife and explained what an A1C is, basic pathophysiology of DM Type 2, basic home care, basic diabetes diet nutrition principles, importance of checking CBGs and maintaining good CBG control to prevent long-term and short-term complications.  Reviewed signs and symptoms of hyperglycemia and hypoglycemia and how to treat hypoglycemia at home.  Also reviewed blood sugar goals at home.    -RNs to provide ongoing basic DM education at bedside with this patient.  Have ordered educational booklet and insulin starter kit.  -Educated patient and spouse on insulin pen use at home.  Reviewed all steps of insulin pen including attachment of needle, 2-unit air shot, dialing up dose, giving injection, removing needle, disposal of sharps, storage of unused insulin, disposal of insulin  etc.  Patient able to provide successful return demonstration.  Have asked RNs caring for patient to please allow patient to give all injections here in hospital as much as possible for practice.    -Gave patient information on purchasing an inexpensive CBG meter and strips at [facility].  Meter is $9 and a box of 50 strips is $9.  Encouraged patient to check his CBGs at least 3 times daily at home.     MD- Patient would prefer insulin pens at time of discharge.  Wife concerned about the cost of the insulin.  Have asked her to contact her insurance company to see which insulins will be most affordable.  Will also give pt and his wife a Rx card for Lantus that will guarantee Co-pay of $10 per month with the Co-pay card.  MD- Please give patient Rxs for insulin pens and insulin pen needles at time of discharge.  Can use the following Order #:  Lantus insulin pen- Order # [id number]  Insulin Pen needles- Order # [id number]  Novolog insulin pen (if you decide to send pt home on Novolog as well)- Order # [id number]    --Will follow patient during hospitalization--  Wyn Quaker RN, MSN, CDE Diabetes Coordinator Inpatient Glycemic Control Team Team Pager: [phone] (8a-5p)
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
## 2015-09-01 NOTE — Progress Notes (Signed)
Pt agreed to give his own insulin in abdomen.  Pt states his wife will be the one primarily giving him the insulin.  Wife is a CNA and seems very knowledgeable/competent regarding pt care/insulin.    Pt states that he will feel more comfortable using the insulin pen rather than drawing it up from a vial.

## 2015-09-01 NOTE — Progress Notes (Signed)
Progress Note    Randy [name]  ZOX:[id number] DOB: February 10, 1981  DOA: 08/31/2015 PCP: Frederich Chick, MD   Outpatient Specialists:   None.   Brief Narrative:   Randy [name] is an 35 y.o. male with a PMH of diabetes and hypertension who was admitted 08/31/15 with chief complaint of nausea, vomiting, diaphoresis, generalized abdominal pain as well as left eye pain associated with photophobia. These symptoms occurred in the setting of having run out of his diabetes and blood pressure medications for the past 3 days. Initial evaluation revealed marked elevation of the patient's blood pressure, 197/77 but has varied with diastolics up to 125. Attempts to bring his blood pressure down by giving him his home dose of lisinopril caused him to vomit. His ocular pressures were checked by the ED provider and they were not elevated. CT of the head was negative for acute findings. Because of ongoing problems with nausea and vomiting and marked elevation of his blood pressure and glucose, he was referred for admission.  Assessment/Plan:   Principal Problem:  Hypertensive urgency Treated with 10 mg of labetalol every 6 hours, and hydralazine as needed for elevated blood pressures. Resume Vasotec. Change labetalol to 200 mg twice a day. Blood pressure significantly improved.  Active Problems:  Intractable nausea and vomiting Hydrated and provided with Zofran. May have hyperemesis syndrome related to marijuana abuse. Vomiting resolved. Tolerating diet.   Uncontrolled diabetes mellitus (HCC) Hemoglobin A1c is 13.3%, noncompliant with medical treatment. We'll asked diabetes coordinator to educate. Hold oral medications, currently being treated with moderate scale SSI with 4 units of meal coverage. Add 10 units of Lantus and increase meal coverage to 5 units. CBGs 233-300. Will likely need treatment with insulin as an outpatient, but am concerned that he will not be compliant given long-standing history  of noncompliance.   Marijuana abuse UDS + THC.  Counsel.    Noncompliance with medication regimen Psychiatry evaluation performed 09/01/15. Outpatient counseling recommended. No current indication for treatment with antidepressants or antianxiety medications.  Family Communication/Anticipated D/C date and plan/Code Status   DVT prophylaxis: Lovenox ordered. Code Status: Full Code.  Family Communication: Wife updated at the bedside. Disposition Plan: Home 09/02/15 of glycemic control improved and blood pressure stable.   Medical Consultants:    Psychiatry   Procedures:    Anti-Infectives:   Anti-infectives    None      Subjective:   Randy [name] has a flat affect. Denies feeling depressed or anxious and states he doesn't have "any excuse "for not taking his medications. No complaints of chest pain. No eye pain today.  Objective:    Filed Vitals:   08/31/15 1931 08/31/15 2303 09/01/15 0313 09/01/15 0615  BP: 177/101 132/75 121/68 139/92  Pulse: 90  86 79  Temp: 97.5 F (36.4 C)  97.9 F (36.6 C)   TempSrc: Oral  Oral   Resp: 18  17   Height:      Weight:   90.674 kg (199 lb 14.4 oz)   SpO2: 100%  99%     Intake/Output Summary (Last 24 hours) at 09/01/15 0837 Last data filed at 09/01/15 0700  Gross per 24 hour  Intake      3 ml  Output    800 ml  Net   -797 ml   Filed Weights   08/31/15 1857 09/01/15 0313  Weight: 90.357 kg (199 lb 3.2 oz) 90.674 kg (199 lb 14.4 oz)    Exam: General exam: Appears  calm and comfortable.  Respiratory system: Clear to auscultation. Respiratory effort normal. Cardiovascular system: S1 & S2 heard, RRR. No JVD, murmurs, rubs, gallops or clicks. No pedal edema. Gastrointestinal system: Abdomen is nondistended, soft and nontender. No organomegaly or masses felt. Normal bowel sounds heard. Central nervous system: Alert and oriented. No focal neurological deficits. Extremities: Symmetric 5 x 5 power. No clubbing, edema, or  cyanosis. Skin: No rashes, lesions or ulcers Psychiatry: Judgement and insight appear diminished. Mood & affect flat.   Data Reviewed:   I have personally reviewed following labs and imaging studies:  Labs: Basic Metabolic Panel:  Recent Labs Lab 08/31/15 1250 08/31/15 1302 08/31/15 1921  NA 136 136  --   K 5.2* 5.1  --   CL 100* 99*  --   CO2 23  --   --   GLUCOSE 325* 320*  --   BUN 8 13  --   CREATININE 0.91 0.70 0.80  CALCIUM 9.1  --   --    GFR Estimated Creatinine Clearance: 139.6 mL/min (by C-G formula based on Cr of 0.8). Liver Function Tests:  Recent Labs Lab 08/31/15 1250  AST 35  ALT 28  ALKPHOS 85  BILITOT 1.6*  PROT 7.5  ALBUMIN 3.9    Recent Labs Lab 08/31/15 1250  LIPASE 42   CBC:  Recent Labs Lab 08/31/15 1250 08/31/15 1302 08/31/15 1921  WBC 9.9  --  9.8  NEUTROABS 7.9*  --   --   HGB 14.1 16.3 13.9  HCT 41.8 48.0 41.8  MCV 81.3  --  82.4  PLT 212  --  227   Cardiac Enzymes:  Recent Labs Lab 08/31/15 1921 09/01/15 0053  TROPONINI <0.03 <0.03   CBG:  Recent Labs Lab 08/31/15 1610 08/31/15 2005  GLUCAP 233* 300*   Hgb A1c:  Recent Labs  08/31/15 1921  HGBA1C 13.3*   Urine analysis:    Component Value Date/Time   COLORURINE YELLOW 08/31/2015 1549   APPEARANCEUR CLEAR 08/31/2015 1549   LABSPEC 1.037* 08/31/2015 1549   PHURINE 6.5 08/31/2015 1549   GLUCOSEU >1000* 08/31/2015 1549   HGBUR NEGATIVE 08/31/2015 1549   BILIRUBINUR NEGATIVE 08/31/2015 1549   KETONESUR >80* 08/31/2015 1549   PROTEINUR 30* 08/31/2015 1549   UROBILINOGEN 1.0 10/19/2013 2130   NITRITE NEGATIVE 08/31/2015 1549   LEUKOCYTESUR NEGATIVE 08/31/2015 1549   Microbiology No results found for this or any previous visit (from the past 240 hour(s)).  Radiology: Ct Head Wo Contrast  08/31/2015  CLINICAL DATA:  Headache and left eye pain beginning today. No known injury. Initial encounter. EXAM: CT HEAD WITHOUT CONTRAST TECHNIQUE: Contiguous  axial images were obtained from the base of the skull through the vertex without intravenous contrast. COMPARISON:  None. FINDINGS: The brain appears normal without hemorrhage, infarct, mass lesion, mass effect, midline shift or abnormal extra-axial fluid collection. No hydrocephalus or pneumocephalus. 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 The calvarium is intact. Imaged paranasal sinuses and mastoid air cells are clear. IMPRESSION: Negative head CT. Electronically Signed   By: Drusilla Kannerhomas  Dalessio M.D.   On: 08/31/2015 13:54    Medications:   . enoxaparin (LOVENOX) injection  40 mg Subcutaneous Q24H  . hydrALAZINE  10 mg Intravenous STAT  . insulin aspart  0-15 Units Subcutaneous TID WC  . insulin aspart  0-5 Units Subcutaneous QHS  . insulin aspart  4 Units Subcutaneous TID WC  . labetalol  10 mg Intravenous Q6H  . sodium chloride flush  3  mL Intravenous Q12H  . sodium chloride flush  3 mL Intravenous Q12H   Continuous Infusions:   Time spent: 35 minutes with > 50% of time discussing current diagnostic test results, clinical impression and plan of care.     RAMA,CHRISTINA  [facility] Pager [phone]. If unable to reach me by pager, please call my cell phone at [phone].  *Please refer to [email or web address], password [email or web address] to get updated schedule on who will round on this patient, as hospitalists switch teams weekly. If 7PM-7AM, please contact night-coverage at [email or web address], password [email or web address] for any overnight needs.  09/01/2015, 8:37 AM

## 2015-09-02 LAB — GLUCOSE, CAPILLARY
GLUCOSE-CAPILLARY: 217 mg/dL — AB (ref 65–99)
Glucose-Capillary: 192 mg/dL — ABNORMAL HIGH (ref 65–99)

## 2015-09-02 MED ORDER — "PEN NEEDLES 3/16"" 31G X 5 MM MISC": Status: DC

## 2015-09-02 MED ORDER — INSULIN ASPART 100 UNIT/ML FLEXPEN: 5.0000 [IU] | PEN_INJECTOR | Freq: Three times a day (TID) | SUBCUTANEOUS | Status: DC

## 2015-09-02 MED ORDER — INSULIN GLARGINE 100 UNIT/ML SOLOSTAR PEN: 10.0000 [IU] | PEN_INJECTOR | Freq: Every day | SUBCUTANEOUS | Status: DC

## 2015-09-02 MED ORDER — GLIPIZIDE ER 10 MG PO TB24: 10.0000 mg | ORAL_TABLET | Freq: Every day | ORAL | Status: DC

## 2015-09-02 MED ORDER — LABETALOL HCL 200 MG PO TABS: 200.0000 mg | ORAL_TABLET | Freq: Two times a day (BID) | ORAL | Status: DC

## 2015-09-02 MED ORDER — ENALAPRIL MALEATE 20 MG PO TABS: 10.0000 mg | ORAL_TABLET | Freq: Every day | ORAL | Status: DC

## 2015-09-02 NOTE — Discharge Summary (Signed)
Physician Discharge Summary  Randy [name] RUE:[id number] DOB: 04-May-1981 DOA: 08/31/2015  PCP: Frederich Chick, MD  Admit date: 08/31/2015 Discharge date: 09/02/2015   Recommendations for Outpatient Follow-Up:   1. The patient needs close follow-up of his blood pressure and glycemic control.   Discharge Diagnosis:   Principal Problem:    Hypertensive urgency Active Problems:    Intractable nausea and vomiting    Uncontrolled diabetes mellitus (HCC)    Marijuana abuse    Noncompliance with medication regimen   Discharge disposition:  Home.    Discharge Condition: Improved.  Diet recommendation: Low sodium, heart healthy.  Carbohydrate-modified.    History of Present Illness:   Randy [name] is an 35 y.o. male with a PMH of diabetes and hypertension who was admitted 08/31/15 with chief complaint of nausea, vomiting, diaphoresis, generalized abdominal pain as well as left eye pain associated with photophobia. These symptoms occurred in the setting of having run out of his diabetes and blood pressure medications for the past 3 days. Initial evaluation revealed marked elevation of the patient's blood pressure, 197/77 but has varied with diastolics up to 125. Attempts to bring his blood pressure down by giving him his home dose of lisinopril caused him to vomit. His ocular pressures were checked by the ED provider and they were not elevated. CT of the head was negative for acute findings. Because of ongoing problems with nausea and vomiting and marked elevation of his blood pressure and glucose, he was referred for admission.   Hospital Course by Problem:   Principal Problem:  Hypertensive urgency Initially treated with 10 mg of labetalol every 6 hours, and hydralazine as needed for elevated blood pressures. Vasotec resumed 09/01/15 and placed on labetalol to 200 mg twice a day. Blood pressure significantly improved.  Active Problems:  Intractable nausea and vomiting Hydrated  and provided with Zofran. May have hyperemesis syndrome related to marijuana abuse. Vomiting resolved. Tolerating diet.   Uncontrolled diabetes mellitus (HCC) Hemoglobin A1c is 13.3%, noncompliant with medical treatment. Counseled by diabetes coordinator. We'll discharge home on 10 units of Lantus daily and 5 units of NovoLog before each meal. Continue Glipizide. Will need close outpatient follow-up.   Marijuana abuse UDS + THC. Counseled.    Noncompliance with medication regimen Psychiatry evaluation performed 09/01/15. Outpatient counseling recommended. No current indication for treatment with antidepressants or antianxiety medications.   Medical Consultants:    Psychiatry   Discharge Exam:   Filed Vitals:   09/02/15 0949 09/02/15 0951  BP: 134/84 134/84  Pulse:  75  Temp:    Resp:     Filed Vitals:   09/02/15 0610 09/02/15 0800 09/02/15 0949 09/02/15 0951  BP: 127/82 134/84 134/84 134/84  Pulse:  75  75  Temp: 98.2 F (36.8 C) 97.6 F (36.4 C)    TempSrc: Oral Oral    Resp: 18 18    Height:      Weight: 90.81 kg (200 lb 3.2 oz)     SpO2: 100% 100%      Gen:  NAD Cardiovascular:  RRR, No M/R/G Respiratory: Lungs CTAB Gastrointestinal: Abdomen soft, NT/ND with normal active bowel sounds. Extremities: No C/E/C   The results of significant diagnostics from this hospitalization (including imaging, microbiology, ancillary and laboratory) are listed below for reference.     Procedures and Diagnostic Studies:   Ct Head Wo Contrast  08/31/2015  CLINICAL DATA:  Headache and left eye pain beginning today. No known injury. Initial encounter. EXAM: CT HEAD  WITHOUT CONTRAST TECHNIQUE: Contiguous axial images were obtained from the base of the skull through the vertex without intravenous contrast. COMPARISON:  None. FINDINGS: The brain appears normal without hemorrhage, infarct, mass lesion, mass effect, midline shift or abnormal extra-axial fluid collection. No  hydrocephalus or pneumocephalus. 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 The calvarium is intact. Imaged paranasal sinuses and mastoid air cells are clear. IMPRESSION: Negative head CT. Electronically Signed   By: Drusilla Kannerhomas  Dalessio M.D.   On: 08/31/2015 13:54     Labs:   Basic Metabolic Panel:  Recent Labs Lab 08/31/15 1250 08/31/15 1302 08/31/15 1921 09/01/15 0631  NA 136 136  --  135  K 5.2* 5.1  --  3.9  CL 100* 99*  --  102  CO2 23  --   --  22  GLUCOSE 325* 320*  --  235*  BUN 8 13  --  9  CREATININE 0.91 0.70 0.80 0.87  CALCIUM 9.1  --   --  8.8*   GFR Estimated Creatinine Clearance: 128.5 mL/min (by C-G formula based on Cr of 0.87). Liver Function Tests:  Recent Labs Lab 08/31/15 1250  AST 35  ALT 28  ALKPHOS 85  BILITOT 1.6*  PROT 7.5  ALBUMIN 3.9    Recent Labs Lab 08/31/15 1250  LIPASE 42   No results for input(s): AMMONIA in the last 168 hours. Coagulation profile No results for input(s): INR, PROTIME in the last 168 hours.  CBC:  Recent Labs Lab 08/31/15 1250 08/31/15 1302 08/31/15 1921  WBC 9.9  --  9.8  NEUTROABS 7.9*  --   --   HGB 14.1 16.3 13.9  HCT 41.8 48.0 41.8  MCV 81.3  --  82.4  PLT 212  --  227   Cardiac Enzymes:  Recent Labs Lab 08/31/15 1921 09/01/15 0053 09/01/15 0631  TROPONINI <0.03 <0.03 <0.03   BNP: Invalid input(s): POCBNP CBG:  Recent Labs Lab 09/01/15 1123 09/01/15 1610 09/01/15 1813 09/01/15 2138 09/02/15 0758  GLUCAP 333* 76 251* 222* 217*   D-Dimer No results for input(s): DDIMER in the last 72 hours. Hgb A1c  Recent Labs  08/31/15 1921  HGBA1C 13.3*   Lipid Profile No results for input(s): CHOL, HDL, LDLCALC, TRIG, CHOLHDL, LDLDIRECT in the last 72 hours. Thyroid function studies No results for input(s): TSH, T4TOTAL, T3FREE, THYROIDAB in the last 72 hours.  Invalid input(s): FREET3 Anemia work up No results for input(s): VITAMINB12, FOLATE, FERRITIN, TIBC, IRON, RETICCTPCT in  the last 72 hours. Microbiology No results found for this or any previous visit (from the past 240 hour(s)).   Discharge Instructions:       Discharge Instructions    Amb Referral to Nutrition and Diabetic E    Complete by:  As directed   A1c 13.3%.  PCP= Dr[facility]ol [facility]  To d/c home on insulin this admission.  Please let wife/patient know what co-pay will be before classes.  Thanks!     Call MD for:  difficulty breathing, headache or visual disturbances    Complete by:  As directed      Call MD for:  extreme fatigue    Complete by:  As directed      Diet - low sodium heart healthy    Complete by:  As directed      Discharge instructions    Complete by:  As directed   It is VERY IMPORTANT that you follow up with a PCP on  a regular basis.  Check your blood glucoses before each meal and at bedtime and maintain a log of your readings.  Bring this log with you when you follow up with your PCP so that he or she can adjust your insulin at your follow up visit.     Increase activity slowly    Complete by:  As directed             Medication List    STOP taking these medications        linagliptin 5 MG Tabs tablet  Commonly known as:  TRADJENTA     lisinopril 10 MG tablet  Commonly known as:  PRINIVIL,ZESTRIL     ondansetron 4 MG disintegrating tablet  Commonly known as:  ZOFRAN ODT      TAKE these medications        enalapril 20 MG tablet  Commonly known as:  VASOTEC  Take 10 mg by mouth daily.     glipiZIDE 10 MG 24 hr tablet  Commonly known as:  GLUCOTROL XL  Take 10 mg by mouth daily with breakfast.     insulin aspart 100 UNIT/ML FlexPen  Commonly known as:  NOVOLOG FLEXPEN  Inject 5 Units into the skin 3 (three) times daily with meals.     Insulin Glargine 100 UNIT/ML Solostar Pen  Commonly known as:  LANTUS SOLOSTAR  Inject 10 Units into the skin daily at 10 pm.     labetalol 200 MG tablet  Commonly known as:  NORMODYNE  Take 1  tablet (200 mg total) by mouth 2 (two) times daily.     Pen Needles 3/16" 31G X 5 MM Misc  Use as needed with insulin pens.       Follow-up Information    Follow up with WEBB, CAROL D, MD. Schedule an appointment as soon as possible for a visit in 1 week.   Specialty:  Family Medicine   Why:  Hospital follow up, bring a log of your blood sugar readings with you.   Contact information:   [address] [place] [address] [phone]        Time coordinating discharge: 35 minutes.  Signed:  Yuka Lallier  Pager [phone] [facility] 09/02/2015, 10:15 AM

## 2015-09-02 NOTE — Discharge Instructions (Signed)
Blood Glucose Monitoring, Adult Monitoring your blood glucose (also know as blood sugar) helps you to manage your diabetes. It also helps you and your health care provider monitor your diabetes and determine how well your treatment plan is working. WHY SHOULD YOU MONITOR YOUR BLOOD GLUCOSE?  It can help you understand how food, exercise, and medicine affect your blood glucose.  It allows you to know what your blood glucose is at any given moment. You can quickly tell if you are having low blood glucose (hypoglycemia) or high blood glucose (hyperglycemia).  It can help you and your health care provider know how to adjust your medicines.  It can help you understand how to manage an illness or adjust medicine for exercise. WHEN SHOULD YOU TEST? Your health care provider will help you decide how often you should check your blood glucose. This may depend on the type of diabetes you have, your diabetes control, or the types of medicines you are taking. Be sure to write down all of your blood glucose readings so that this information can be reviewed with your health care provider. See below for examples of testing times that your health care provider may suggest. Type 1 Diabetes  Test at least 2 times per day if your diabetes is well controlled, if you are using an insulin pump, or if you perform multiple daily injections.  If your diabetes is not well controlled or if you are sick, you may need to test more often.  It is a good idea to also test:  Before every insulin injection.  Before and after exercise.  Between meals and 2 hours after a meal.  Occasionally between 2:00 a.m. and 3:00 a.m. Type 2 Diabetes  If you are taking insulin, test at least 2 times per day. However, it is best to test before every insulin injection.  If you take medicines by mouth (orally), test 2 times a day.  If you are on a controlled diet, test once a day.  If your diabetes is not well controlled or if you  are sick, you may need to monitor more often. HOW TO MONITOR YOUR BLOOD GLUCOSE Supplies Needed 1. Blood glucose meter. 2. Test strips for your meter. Each meter has its own strips. You must use the strips that go with your own meter. 3. A pricking needle (lancet). 4. A device that holds the lancet (lancing device). 5. A journal or log book to write down your results. Procedure  Wash your hands with soap and water. Alcohol is not preferred.  Prick the side of your finger (not the tip) with the lancet.  Gently milk the finger until a small drop of blood appears.  Follow the instructions that come with your meter for inserting the test strip, applying blood to the strip, and using your blood glucose meter. Other Areas to Get Blood for Testing Some meters allow you to use other areas of your body (other than your finger) to test your blood. These areas are called alternative sites. The most common alternative sites are:  The forearm.  The thigh.  The back area of the lower leg.  The palm of the hand. The blood flow in these areas is slower. Therefore, the blood glucose values you get may be delayed, and the numbers are different from what you would get from your fingers. Do not use alternative sites if you think you are having hypoglycemia. Your reading will not be accurate. Always use a finger if you are  having hypoglycemia. Also, if you cannot feel your lows (hypoglycemia unawareness), always use your fingers for your blood glucose checks. ADDITIONAL TIPS FOR GLUCOSE MONITORING  Do not reuse lancets.  Always carry your supplies with you.  All blood glucose meters have a 24-hour "hotline" number to call if you have questions or need help.  Adjust (calibrate) your blood glucose meter with a control solution after finishing a few boxes of strips. BLOOD GLUCOSE RECORD KEEPING It is a good idea to keep a daily record or log of your blood glucose readings. Most glucose meters, if not  all, keep your glucose records stored in the meter. Some meters come with the ability to download your records to your home computer. Keeping a record of your blood glucose readings is especially helpful if you are wanting to look for patterns. Make notes to go along with the blood glucose readings because you might forget what happened at that exact time. Keeping good records helps you and your health care provider to work together to achieve good diabetes management.    This information is not intended to replace advice given to you by your health care provider. Make sure you discuss any questions you have with your health care provider.   Document Released: 05/02/2003 Document Revised: 05/20/2014 Document Reviewed: 09/21/2012 Elsevier Interactive Patient Education 2016 [employer]  Diabetes Mellitus and Food It is important for you to manage your blood sugar (glucose) level. Your blood glucose level can be greatly affected by what you eat. Eating healthier foods in the appropriate amounts throughout the day at about the same time each day will help you control your blood glucose level. It can also help slow or prevent worsening of your diabetes mellitus. Healthy eating may even help you improve the level of your blood pressure and reach or maintain a healthy weight.  General recommendations for healthful eating and cooking habits include:  Eating meals and snacks regularly. Avoid going long periods of time without eating to lose weight.  Eating a diet that consists mainly of plant-based foods, such as fruits, vegetables, nuts, legumes, and whole grains.  Using low-heat cooking methods, such as baking, instead of high-heat cooking methods, such as deep frying. Work with your dietitian to make sure you understand how to use the Nutrition Facts information on food labels. HOW CAN FOOD AFFECT ME? Carbohydrates Carbohydrates affect your blood glucose level more than any other type of food. Your  dietitian will help you determine how many carbohydrates to eat at each meal and teach you how to count carbohydrates. Counting carbohydrates is important to keep your blood glucose at a healthy level, especially if you are using insulin or taking certain medicines for diabetes mellitus. Alcohol Alcohol can cause sudden decreases in blood glucose (hypoglycemia), especially if you use insulin or take certain medicines for diabetes mellitus. Hypoglycemia can be a life-threatening condition. Symptoms of hypoglycemia (sleepiness, dizziness, and disorientation) are similar to symptoms of having too much alcohol.  If your health care provider has given you approval to drink alcohol, do so in moderation and use the following guidelines:  Women should not have more than one drink per day, and men should not have more than two drinks per day. One drink is equal to:  12 oz of beer.  5 oz of wine.  1 oz of hard liquor.  Do not drink on an empty stomach.  Keep yourself hydrated. Have water, diet soda, or unsweetened iced tea.  Regular soda, juice, and  other mixers might contain a lot of carbohydrates and should be counted. WHAT FOODS ARE NOT RECOMMENDED? As you make food choices, it is important to remember that all foods are not the same. Some foods have fewer nutrients per serving than other foods, even though they might have the same number of calories or carbohydrates. It is difficult to get your body what it needs when you eat foods with fewer nutrients. Examples of foods that you should avoid that are high in calories and carbohydrates but low in nutrients include:  Trans fats (most processed foods list trans fats on the Nutrition Facts label).  Regular soda.  Juice.  Candy.  Sweets, such as cake, pie, doughnuts, and cookies.  Fried foods. WHAT FOODS CAN I EAT? Eat nutrient-rich foods, which will nourish your body and keep you healthy. The food you should eat also will depend on several  factors, including: 6. The calories you need. 7. The medicines you take. 8. Your weight. 9. Your blood glucose level. 10. Your blood pressure level. 11. Your cholesterol level. You should eat a variety of foods, including:  Protein.  Lean cuts of meat.  Proteins low in saturated fats, such as fish, egg whites, and beans. Avoid processed meats.  Fruits and vegetables.  Fruits and vegetables that may help control blood glucose levels, such as apples, mangoes, and yams.  Dairy products.  Choose fat-free or low-fat dairy products, such as milk, yogurt, and cheese.  Grains, bread, pasta, and rice.  Choose whole grain products, such as multigrain bread, whole oats, and brown rice. These foods may help control blood pressure.  Fats.  Foods containing healthful fats, such as nuts, avocado, olive oil, canola oil, and fish. DOES EVERYONE WITH DIABETES MELLITUS HAVE THE SAME MEAL PLAN? Because every person with diabetes mellitus is different, there is not one meal plan that works for everyone. It is very important that you meet with a dietitian who will help you create a meal plan that is just right for you.   This information is not intended to replace advice given to you by your health care provider. Make sure you discuss any questions you have with your health care provider.   Document Released: 01/24/2005 Document Revised: 05/20/2014 Document Reviewed: 03/26/2013 Elsevier Interactive Patient Education 2016 [employer] Inc[employer]  Hemoglobin A1c Test Some of the sugar (glucose) that circulates in your blood sticks or binds to blood proteins. Hemoglobin (Hb or Hgb) is one type of blood protein that glucose binds to. It also carries oxygen in the red blood cells (RBCs). When glucose binds to Hb, the glucose-coated Hb is called glycated Hb. Once Hb is glycated, it remains that way for the life of the RBC. This is about 120 days. Rather than testing your blood glucose level on one single day, the  hemoglobin A1c (HbA1c) test measures the average amount of glycated hemoglobin and, therefore, the average amount of glucose in your blood during the 3-4 months just before the test is done. The HbA1c test is used to monitor long-term control of blood sugar in people who have diabetes mellitus. The HbA1c test can also be used in addition to or in combination with fasting blood glucose level and oral glucose tolerance tests. RESULTS It is your responsibility to obtain your test results. Ask the lab or department performing the test when and how you will get your results. Contact your health care provider to discuss any questions you have about your results. Range of Normal Values Ranges  for normal values may vary among different labs and hospitals. You should always check with your health care provider after having lab work or other tests done to discuss the meaning of your test results and whether your values are considered within normal limits. The ranges for normal HbA1c test results are as follows:  Adult or child without diabetes: 4-5.9%.  Adult or child with diabetes and good blood glucose control: less than 6.5%. Several factors can affect HbA1c test results. These may include:  Diseases (hemoglobinopathies) that cause a change in the shape, size, or amount of Hb in your blood.  Longer than normal RBC life span.  Abnormally low levels of certain proteins in your blood.  Eating foods or taking supplements that are high in vitamin C (ascorbic acid). Meaning of Results Outside Normal Value Ranges Abnormally high HbA1c values are most commonly an indication of prediabetes mellitus and diabetes mellitus:  An HbA1c result of 5.7-6.4% is considered diagnostic of prediabetes mellitus.  An HbA1c result of 6.5% or higher on two separate occasions is considered diagnostic of diabetes mellitus. Abnormally low HbA1c values can be caused by several health conditions. These may  include: 12. Pregnancy. 13. A large amount of blood loss. 14. Blood transfusions. 15. Low red blood cell count (anemia). This is caused by premature destruction of red blood cells. 16. Long-term kidney failure. 17. Some unusual forms of Hb (Hb variants), such as sickle cell trait. Discuss your test results with your health care provider. He or she will use the results to make a diagnosis and determine a treatment plan that is right for you.   This information is not intended to replace advice given to you by your health care provider. Make sure you discuss any questions you have with your health care provider.   Document Released: 05/21/2004 Document Revised: 05/20/2014 Document Reviewed: 09/13/2013 Elsevier Interactive Patient Education 2016 [employer].  How and Where to Give Subcutaneous Insulin Injections, Adult People with type 1 diabetes must take insulin since their bodies do not make it. People with type 2 diabetes may require insulin. There are many different types of insulin as well as other injectable diabetes medicines that are meant to be injected into the fat layer under your skin. The type of insulin or injectable diabetes medicine you take may determine how many injections you give yourself and when to take the injections.  CHOOSING A SITE FOR INJECTION Insulin absorption varies from site to site. As with any injectable medication it is best for the insulin to be injected within the same body region. However, do not inject the insulin in the same spot each time. Rotating the spots you give your injections will prevent inflammation or tissue breakdown. There are four main regions that can be used for injections. The regions include the:  Abdomen (preferred region, especially for non-insulin injectable diabetes medicine).  Front and upper outer sides of thighs.  Back of upper arm.  Buttocks. USING A SYRINGE AND VIAL Drawing up insulin: single insulin dose  Wash your hands  with soap and water.  Gently roll the insulin bottle (vial) between your hands to mix it. Do not shake the vial.  Clean the top rubber part of the vial with an alcohol wipe. Be sure that the plastic pop-top has been removed on newer vials.  Remove the plastic cover from the needle on the syringe. Do not let the needle touch anything.  Pull the plunger back to draw air into the syringe. The  air should be the same amount as the insulin dose.  Push the needle through the rubber on the top of the vial. Do not turn the vial over.  Push the plunger in all the way to put the air into the vial.  Leave the needle in the vial and turn the vial and syringe upside down.  Pull down slowly on the plunger, drawing the amount of insulin you need into the syringe.  Look for air bubbles in the syringe. You may need to push the plunger up and down 2 to 3 times to slowly get rid of any air bubbles in the syringe.  Pull back the plunger to get your correct dose.  Remove the needle from the vial.  Use an alcohol wipe to clean the area of the body to be injected.  Pinch up 1 inch of skin and hold it.  Put the needle straight into the skin (90-degree angle). Put the needle in as far as it will go (to the hub). The needle may need to be injected at a 45-degree angle in small adults with little fat.  When the needle is in, you can let go of your skin.  Push the plunger down all the way to inject the insulin.  Pull the needle straight out of the skin.  Press the alcohol wipe over the spot where you gave your injection. Keep it there for a few seconds. Do not rub the area.  Do not put the plastic cover back on the needle. Drawing up insulin: mixing 2 insulins  Wash your hands with soap and water.  Gently roll the vial of "cloudy" insulin between your hands or rotate the vial from top to bottom to mix.  Clean the top of both vials with an alcohol wipe. Be sure that the plastic pop-top lid has been  removed on newer vials.  Pull air into the syringe to equal the dose of "cloudy" insulin.  Stick the needle into the "cloudy" insulin vial and inject the air. Be sure to keep the vial upright.  Remove the needle from the "cloudy" insulin vial.  Pull air into the syringe to equal the dose of "clear" insulin.  Stick the needle into the "clear" insulin vial and inject the air.  Leave the needle in the "clear" insulin vial and turn the vial upside down.  Pull down on the plunger and slowly draw into the syringe the number of units of "clear" insulin desired.  Look for air bubbles in the syringe. You may need to push the plunger up and down 2 to 3 times to slowly get rid of any air bubbles in the syringe.  Remove the needle from the "clear" insulin vial.  Stick the needle into the "cloudy" insulin vial. Do not inject any of the "clear" insulin into the "cloudy" vial.  Turn the "cloudy" vial upside down and pull the plunger down to the number of units that equals the total number of units of "clear" and "cloudy" insulins.  Remove the needle from the "cloudy" insulin vial.  Use an alcohol wipe to clean the area of the body to be injected.  Put the needle straight into the skin (90-degree angle). Put the needle in as far as it will go (to the hub). The needle may need to be injected at a 45-degree angle in small adults with little fat.  When the needle is in, you can let go of your skin.  Push the plunger down all the  way to inject the insulin.  Pull the needle straight out of the skin.  Press the alcohol wipe over the spot where you gave your injection. Keep it there for a few seconds. Do not rub the area.  Do not put the plastic cover back on the needle. USING INSULIN PENS 18. Wash your hands with soap and water. 19. If you are using the "cloudy" insulin, roll the pen between your palms several times or rotate the pen top to bottom several times. 20. Remove the insulin pen  cap. 21. Clean the rubber stopper of the cartridge with an alcohol wipe. 22. Remove the protective paper tab from the disposable needle. 23. Screw the needle onto the pen. 24. Remove the outer plastic needle cover. 25. Remove the inner plastic needle cover. 26. Prime the insulin pen by turning the button (dial) to 2 units. Hold the pen with the needle pointing up, and push the dial on the opposite end until a drop of insulin appears at the needle tip. If no insulin appears, repeat this step. 27. Dial the number of units of insulin you will inject. 28. Use an alcohol wipe to clean the area of the body to be injected. 29. Pinch up 1 inch of skin and hold it. 30. Put the needle straight into the skin (90-degree angle). 31. Push the dial down to push the insulin into the fat tissue. 32. Count to 10 slowly. Then, remove the needle from the fat tissue. 33. Carefully replace the larger outer plastic needle cover over the needle and unscrew the capped needle. THROWING AWAY SUPPLIES  Discard used needles in a puncture proof sharps disposal container. Follow disposal regulations for the area where you live.  Vials and empty disposable pens may be thrown away in the regular trash.   This information is not intended to replace advice given to you by your health care provider. Make sure you discuss any questions you have with your health care provider.   Document Released: 07/20/2003 Document Revised: 05/20/2014 Document Reviewed: 10/06/2012 Elsevier Interactive Patient Education 2016 [employer].  How to Avoid Diabetes Problems You can do a lot to prevent or slow down diabetes problems. Following your diabetes plan and taking care of yourself can reduce your risk of serious or life-threatening complications. Below, you will find certain things you can do to prevent diabetes problems. MANAGE YOUR DIABETES Follow your health care provider's, nurse educator's, and dietitian's instructions for managing  your diabetes. They will teach you the basics of diabetes care. They can help answer questions you may have. Learn about diabetes and make healthy choices regarding eating and physical activity. Monitor your blood glucose level regularly. Your health care provider will help you decide how often to check your blood glucose level depending on your treatment goals and how well you are meeting them.  DO NOT USE NICOTINE Nicotine and diabetes are a dangerous combination. Nicotine raises your risk for diabetes problems. If you quit using nicotine, you will lower your risk for heart attack, stroke, nerve disease, and kidney disease. Your cholesterol and your blood pressure levels may improve. Your blood circulation will also improve. Do not use any tobacco products, including cigarettes, chewing tobacco, or electronic cigarettes. If you need help quitting, ask your health care provider. KEEP YOUR BLOOD PRESSURE UNDER CONTROL Your health care provider will determine your individualized target blood pressure based on your age, your medicines, how long you have had diabetes, and any other medical conditions you have. Blood  pressure consists of two numbers. Generally, the goal is to keep your top number (systolic pressure) at or below 130, and your bottom number (diastolic pressure) at or below 80. Your health care provider may recommend a lower target blood pressure reading, if appropriate. Meal planning, medicines, and exercise can help you reach your target blood pressure. Make sure your health care provider checks your blood pressure at every visit. KEEP YOUR CHOLESTEROL UNDER CONTROL Normal cholesterol levels will help prevent heart disease and stroke. These are the biggest health problems for people with diabetes. Keeping cholesterol levels under control can also help with blood flow. Have your cholesterol level checked at least once a year. Your health care provider may prescribe a medicine known as a statin.  Statins lower your cholesterol. If you are not taking a statin, ask your health care provider if you should be. Meal planning, exercise, and medicines can help you reach your cholesterol targets.  SCHEDULE AND KEEP YOUR ANNUAL PHYSICAL EXAMS AND EYE EXAMS Your health care provider will tell you how often he or she wants to see you depending on your plan of treatment. It is important that you keep these appointments so that possible problems can be identified early and complications can be avoided or treated.  Every visit with your health care provider should include your weight, blood pressure, and an evaluation of your blood glucose control.  Your hemoglobin A1c should be checked:  At least twice a year if you are at your goal.  Every 3 months if there are changes in treatment.  If you are not meeting your goals.  Your blood lipids should be checked yearly. You should also be checked yearly to see if you have protein in your urine (microalbumin).  Schedule a dilated eye exam within 5 years of your diagnosis if you have type 1 diabetes, and then yearly. Schedule a dilated eye exam at diagnosis if you have type 2 diabetes, and then yearly. All exams thereafter can be extended to every 2 to 3 years if one or more exams have been normal. KEEP YOUR VACCINES CURRENT It is recommended that you receive a flu (influenza) vaccine every year. It is also recommended that you receive a pneumonia (pneumococcal) vaccine. If you are 64 years of age or older and have never received a pneumonia vaccine, this vaccine may be given as a series of two separate shots. Ask your health care provider which additional vaccines may be recommended. TAKE CARE OF YOUR FEET  Diabetes may cause you to have a poor blood supply (circulation) to your legs and feet. Because of this, the skin may be thinner, break easier, and heal more slowly. You also may have nerve damage in your legs and feet, causing decreased feeling. You may  not notice minor injuries to your feet that could lead to serious problems or infections. Taking care of your feet is very important. Visual foot exams are performed at every routine medical visit. The exams check for cuts, injuries, or other problems with the feet. A comprehensive foot exam should be done yearly. This includes visual inspection as well as assessing foot pulses and testing for loss of sensation. You should also do the following:  Inspect your feet daily for cuts, calluses, blisters, ingrown toenails, and signs of infection, such as redness, swelling, or pus.  Wash and dry your feet thoroughly, especially between the toes.  Avoid soaking your feet regularly in hot water baths.  Moisturize dry skin with lotion, avoiding  areas between your toes.  Cut toenails straight across and file the edges.  Avoid shoes that do not fit well or have areas that irritate your skin.  Avoid going barefooted or wearing only socks. Your feet need protection. TAKE CARE OF YOUR TEETH People with poorly controlled diabetes are more likely to have gum (periodontal) disease. These infections make diabetes harder to control. Periodontal diseases, if left untreated, can lead to tooth loss. Brush your teeth twice a day, floss, and see your dentist for checkups and cleaning every 6 months, or 2 times a year. ASK YOUR HEALTH CARE PROVIDER ABOUT TAKING ASPIRIN Taking aspirin daily is recommended to help prevent cardiovascular disease in people with and without diabetes. Ask your health care provider if this would benefit you and what dose he or she would recommend. DRINK RESPONSIBLY Moderate amounts of alcohol (less than 1 drink per day for adult women and less than 2 drinks per day for adult men) have a minimal effect on blood glucose if ingested with food. It is important to eat food with alcohol to avoid hypoglycemia. People should avoid alcohol if they have a history of alcohol abuse or dependence, if they  are pregnant, and if they have liver disease, pancreatitis, advanced neuropathy, or severe hypertriglyceridemia. LESSEN STRESS Living with diabetes can be stressful. When you are under stress, your blood glucose may be affected in two ways:  Stress hormones may cause your blood glucose to rise.  You may be distracted from taking good care of yourself. It is a good idea to be aware of your stress level and make changes that are necessary to help you better manage challenging situations. Support groups, planned relaxation, a hobby you enjoy, meditation, healthy relationships, and exercise all work to lower your stress level. If your efforts do not seem to be helping, get help from your health care provider or a trained mental health professional.   This information is not intended to replace advice given to you by your health care provider. Make sure you discuss any questions you have with your health care provider.   Document Released: 01/15/2011 Document Revised: 05/20/2014 Document Reviewed: 06/23/2013 Elsevier Interactive Patient Education [employer]

## 2015-09-26 ENCOUNTER — Ambulatory Visit: Payer: Self-pay

## 2015-09-26 ENCOUNTER — Encounter (HOSPITAL_COMMUNITY): Payer: Self-pay | Admitting: *Deleted

## 2015-09-26 ENCOUNTER — Emergency Department (HOSPITAL_COMMUNITY)
Admission: EM | Admit: 2015-09-26 | Discharge: 2015-09-26 | Disposition: A | Discharge status: Home / Self Care | Payer: BLUE CROSS/BLUE SHIELD | Attending: Emergency Medicine | Admitting: Emergency Medicine

## 2015-09-26 DIAGNOSIS — R739 Hyperglycemia, unspecified: Secondary | ICD-10-CM

## 2015-09-26 DIAGNOSIS — K861 Other chronic pancreatitis: Secondary | ICD-10-CM | POA: Diagnosis not present

## 2015-09-26 DIAGNOSIS — Z79899 Other long term (current) drug therapy: Secondary | ICD-10-CM | POA: Diagnosis not present

## 2015-09-26 DIAGNOSIS — E1165 Type 2 diabetes mellitus with hyperglycemia: Secondary | ICD-10-CM | POA: Insufficient documentation

## 2015-09-26 DIAGNOSIS — I10 Essential (primary) hypertension: Secondary | ICD-10-CM

## 2015-09-26 DIAGNOSIS — Z794 Long term (current) use of insulin: Secondary | ICD-10-CM | POA: Diagnosis not present

## 2015-09-26 DIAGNOSIS — Z87891 Personal history of nicotine dependence: Secondary | ICD-10-CM | POA: Diagnosis not present

## 2015-09-26 DIAGNOSIS — R1084 Generalized abdominal pain: Secondary | ICD-10-CM | POA: Diagnosis present

## 2015-09-26 HISTORY — DX: Acute pancreatitis without necrosis or infection, unspecified: K85.90

## 2015-09-26 LAB — CBC
HEMATOCRIT: 39.8 % (ref 39.0–52.0)
HEMOGLOBIN: 13 g/dL (ref 13.0–17.0)
MCH: 27.5 pg (ref 26.0–34.0)
MCHC: 32.7 g/dL (ref 30.0–36.0)
MCV: 84.3 fL (ref 78.0–100.0)
Platelets: 298 10*3/uL (ref 150–400)
RBC: 4.72 MIL/uL (ref 4.22–5.81)
RDW: 12.9 % (ref 11.5–15.5)
WBC: 6.5 10*3/uL (ref 4.0–10.5)

## 2015-09-26 LAB — URINALYSIS, ROUTINE W REFLEX MICROSCOPIC
Bilirubin Urine: NEGATIVE
Glucose, UA: >1000 mg/dL — AB
Hgb urine dipstick: NEGATIVE
Ketones, ur: NEGATIVE mg/dL
Leukocytes, UA: NEGATIVE
NITRITE: NEGATIVE
PROTEIN: NEGATIVE mg/dL
SPECIFIC GRAVITY, URINE: 1.027 (ref 1.005–1.030)
pH: 7 (ref 5.0–8.0)

## 2015-09-26 LAB — COMPREHENSIVE METABOLIC PANEL
ALBUMIN: 3.6 g/dL (ref 3.5–5.0)
ALK PHOS: 98 U/L (ref 38–126)
ALT: 15 U/L — ABNORMAL LOW (ref 17–63)
ANION GAP: 12 (ref 5–15)
AST: 19 U/L (ref 15–41)
BUN: 8 mg/dL (ref 6–20)
CO2: 23 mmol/L (ref 22–32)
Calcium: 9.5 mg/dL (ref 8.9–10.3)
Chloride: 100 mmol/L — ABNORMAL LOW (ref 101–111)
Creatinine, Ser: 0.81 mg/dL (ref 0.61–1.24)
GFR calc Af Amer: >60 mL/min (ref >60)
GFR calc non Af Amer: >60 mL/min (ref >60)
GLUCOSE: 292 mg/dL — AB (ref 65–99)
POTASSIUM: 4.6 mmol/L (ref 3.5–5.1)
SODIUM: 135 mmol/L (ref 135–145)
Total Bilirubin: 0.7 mg/dL (ref 0.3–1.2)
Total Protein: 7.8 g/dL (ref 6.5–8.1)

## 2015-09-26 LAB — URINE MICROSCOPIC-ADD ON: RBC / HPF: NONE SEEN RBC/hpf (ref 0–5)

## 2015-09-26 LAB — RAPID URINE DRUG SCREEN, HOSP PERFORMED
AMPHETAMINES: NOT DETECTED
Barbiturates: NOT DETECTED
Benzodiazepines: NOT DETECTED
COCAINE: NOT DETECTED
OPIATES: NOT DETECTED
TETRAHYDROCANNABINOL: POSITIVE — AB

## 2015-09-26 LAB — LIPASE, BLOOD: Lipase: 213 U/L — ABNORMAL HIGH (ref 11–51)

## 2015-09-26 MED ORDER — OXYCODONE-ACETAMINOPHEN 5-325 MG PO TABS: 1.0000 | ORAL_TABLET | ORAL | Status: DC | PRN

## 2015-09-26 MED ORDER — METOCLOPRAMIDE HCL 5 MG/ML IJ SOLN
10.0000 mg | Freq: Once | INTRAMUSCULAR | Status: AC
  Administered 2015-09-26: 10 mg via INTRAVENOUS
  Filled 2015-09-26: qty 2

## 2015-09-26 MED ORDER — ONDANSETRON 8 MG PO TBDP: 8.0000 mg | ORAL_TABLET | Freq: Three times a day (TID) | ORAL | Status: DC | PRN

## 2015-09-26 MED ORDER — OXYCODONE-ACETAMINOPHEN 5-325 MG PO TABS
2.0000 | ORAL_TABLET | Freq: Once | ORAL | Status: AC
  Administered 2015-09-26: 2 via ORAL
  Filled 2015-09-26: qty 2

## 2015-09-26 MED ORDER — SODIUM CHLORIDE 0.9 % IV BOLUS (SEPSIS)
1000.0000 mL | Freq: Once | INTRAVENOUS | Status: AC
  Administered 2015-09-26: 1000 mL via INTRAVENOUS

## 2015-09-26 NOTE — ED Notes (Signed)
Pt given [place] sandwich and sprite. Pt eating well, denies pain at present.

## 2015-09-26 NOTE — ED Provider Notes (Signed)
CSN: [id number]     Arrival date & time 09/26/15  0732 History   First MD Initiated Contact with Patient 09/26/15 [id number]     Chief Complaint  Patient presents with  . Abdominal Pain     (Consider location/radiation/quality/duration/timing/severity/associated sxs/prior Treatment) HPI   35 year old male with diffuse abdominal pain that radiates to his mid upper back. He reports the pain is present for 6 days. He has had some nausea and vomited or had diarrhea. It does increase with by mouth intake but he has been and drinking as usual. He has a history of pancreatitis and this is similar to previous pancreatitis pain. He denies any alcohol use for several years.    Past Medical History  Diagnosis Date  . Hypertension   . Type II diabetes mellitus (HCC)   . Pancreatitis    Past Surgical History  Procedure Laterality Date  . No past surgeries     Family History  Problem Relation Age of Onset  . Hypertension Mother   . Diabetes Father   . Hypertension Father    Social History  Substance Use Topics  . Smoking status: Former Smoker -- 0.33 packs/day for 17 years    Types: Cigarettes    Quit date: 12/26/2011  . Smokeless tobacco: Never Used  . Alcohol Use: No    Review of Systems  All other systems reviewed and are negative.     Allergies  Review of patient's allergies indicates no known allergies.  Home Medications   Prior to Admission medications   Medication Sig Start Date End Date Taking? Authorizing Provider  enalapril (VASOTEC) 20 MG tablet Take 0.5 tablets (10 mg total) by mouth daily. 09/02/15 01/17/20 Yes Christina P Rama, MD  glipiZIDE (GLUCOTROL XL) 10 MG 24 hr tablet Take 1 tablet (10 mg total) by mouth daily with breakfast. 09/02/15  Yes Christina P Rama, MD  insulin aspart (NOVOLOG FLEXPEN) 100 UNIT/ML FlexPen Inject 5 Units into the skin 3 (three) times daily with meals. 09/02/15  Yes Maryruth Bunhristina P Rama, MD  Insulin Glargine (LANTUS SOLOSTAR) 100 UNIT/ML Solostar  Pen Inject 10 Units into the skin daily at 10 pm. 09/02/15  Yes Maryruth Bunhristina P Rama, MD  Insulin Pen Needle (PEN NEEDLES 3/16") 31G X 5 MM MISC Use as needed with insulin pens. 09/02/15  Yes Maryruth Bunhristina P Rama, MD  labetalol (NORMODYNE) 200 MG tablet Take 1 tablet (200 mg total) by mouth 2 (two) times daily. 09/02/15  Yes Christina P Rama, MD   BP 132/98 mmHg  Pulse 77  Temp(Src) 97.6 F (36.4 C) (Oral)  Resp 18  Wt 93.611 kg  SpO2 99% Physical Exam  Constitutional: He is oriented to person, place, and time. He appears well-developed and well-nourished.  HENT:  Head: Normocephalic and atraumatic.  Right Ear: External ear normal.  Left Ear: External ear normal.  Nose: Nose normal.  Mouth/Throat: Oropharynx is clear and moist.  Eyes: Conjunctivae and EOM are normal. Pupils are equal, round, and reactive to light.  Neck: Normal range of motion. Neck supple.  Cardiovascular: Normal rate, regular rhythm, normal heart sounds and intact distal pulses.   Pulmonary/Chest: Effort normal and breath sounds normal. No respiratory distress. He has no wheezes. He exhibits no tenderness.  Abdominal: Soft. Bowel sounds are normal. He exhibits no distension and no mass. There is no tenderness. There is no guarding.  Mild epigastric tenderness palpation  Musculoskeletal: Normal range of motion.  Neurological: He is alert and oriented to person, place, and time.  He has normal reflexes. He exhibits normal muscle tone. Coordination normal.  Skin: Skin is warm and dry.  Psychiatric: He has a normal mood and affect. His behavior is normal. Judgment and thought content normal.  Nursing note and vitals reviewed.   ED Course  Procedures (including critical care time) Labs Review Labs Reviewed  LIPASE, BLOOD - Abnormal; Notable for the following:    Lipase 213 (*)    All other components within normal limits  COMPREHENSIVE METABOLIC PANEL - Abnormal; Notable for the following:    Chloride 100 (*)    Glucose,  Bld 292 (*)    ALT 15 (*)    All other components within normal limits  CBC  URINALYSIS, ROUTINE W REFLEX MICROSCOPIC (NOT AT [facility])  URINE RAPID DRUG SCREEN, HOSP PERFORMED      MDM   Final diagnoses:  Chronic pancreatitis, unspecified pancreatitis type (HCC)  Essential hypertension  Hyperglycemia    This is a 35 y.o.  male with back pain and nausea and vomiting consistent with previous pancreatitis. Here lipase elevated at 213. He has been given pain medicine and is tolerating liquids. He is also hyperglycemic but has not taken his medication today including glipizide, insulin, and Vasotec. Patient advised regarding taking home medications. He is discharged home with pain medicine number Percocet 10 and Zofran. Advised regarding return precautions and recheck of hyperglycemia voices understanding.    Margarita Grizzle, MD 09/26/15 [phone]

## 2015-09-26 NOTE — Discharge Instructions (Signed)
Acute Pancreatitis  Acute pancreatitis is a disease in which the pancreas becomes suddenly irritated (inflamed). The pancreas is a large gland behind your stomach. The pancreas makes enzymes that help digest food. The pancreas also makes 2 hormones that help control your blood sugar. Acute pancreatitis happens when the enzymes attack and damage the pancreas. Most attacks last a couple of days and can cause serious problems.  HOME CARE  · Follow your doctor's diet instructions. You may need to avoid alcohol and limit fat in your diet.  · Eat small meals often.  · Drink enough fluids to keep your pee (urine) clear or pale yellow.  · Only take medicines as told by your doctor.  · Avoid drinking alcohol if it caused your disease.  · Do not smoke.  · Get plenty of rest.  · Check your blood sugar at home as told by your doctor.  · Keep all doctor visits as told.  GET HELP IF:  · You do not get better as quickly as expected.  · You have new or worsening symptoms.  · You have lasting pain, weakness, or feel sick to your stomach (nauseous).  · You get better and then have another pain attack.  GET HELP RIGHT AWAY IF:   · You are unable to eat or keep fluids down.  · Your pain becomes severe.  · You have a fever or lasting symptoms for more than 2 to 3 days.  · You have a fever and your symptoms suddenly get worse.  · Your skin or the white part of your eyes turn yellow (jaundice).  · You throw up (vomit).  · You feel dizzy, or you pass out (faint).  · Your blood sugar is high (over 300 mg/dL).  MAKE SURE YOU:   · Understand these instructions.  · Will watch your condition.  · Will get help right away if you are not doing well or get worse.     This information is not intended to replace advice given to you by your health care provider. Make sure you discuss any questions you have with your health care provider.     Document Released: 10/16/2007 Document Revised: 05/20/2014 Document Reviewed: 08/08/2011  Elsevier Interactive  Patient Education ©2016 [employer] Inc[employer]

## 2015-09-26 NOTE — ED Notes (Addendum)
Pt c/o generalized abd pain that radiates to mid upper back onset x 6 days ago, pt reports hx of  Pancreatitis, pt denies n/v/d, denies ETOH use, A&O x4

## 2015-09-26 NOTE — ED Notes (Signed)
Pt given urinal for specimen.  

## 2015-10-03 ENCOUNTER — Ambulatory Visit: Payer: Self-pay

## 2015-10-10 ENCOUNTER — Ambulatory Visit: Payer: Self-pay

## 2016-06-08 ENCOUNTER — Encounter (HOSPITAL_COMMUNITY): Payer: Self-pay

## 2016-06-08 ENCOUNTER — Emergency Department (HOSPITAL_COMMUNITY)
Admission: EM | Admit: 2016-06-08 | Discharge: 2016-06-08 | Disposition: A | Discharge status: Home / Self Care | Payer: BLUE CROSS/BLUE SHIELD | Attending: Emergency Medicine | Admitting: Emergency Medicine

## 2016-06-08 DIAGNOSIS — Z79899 Other long term (current) drug therapy: Secondary | ICD-10-CM | POA: Diagnosis not present

## 2016-06-08 DIAGNOSIS — Z794 Long term (current) use of insulin: Secondary | ICD-10-CM | POA: Insufficient documentation

## 2016-06-08 DIAGNOSIS — Z87891 Personal history of nicotine dependence: Secondary | ICD-10-CM | POA: Insufficient documentation

## 2016-06-08 DIAGNOSIS — I10 Essential (primary) hypertension: Secondary | ICD-10-CM | POA: Insufficient documentation

## 2016-06-08 DIAGNOSIS — Z9114 Patient's other noncompliance with medication regimen: Secondary | ICD-10-CM | POA: Insufficient documentation

## 2016-06-08 DIAGNOSIS — E1165 Type 2 diabetes mellitus with hyperglycemia: Secondary | ICD-10-CM | POA: Diagnosis not present

## 2016-06-08 DIAGNOSIS — R739 Hyperglycemia, unspecified: Secondary | ICD-10-CM

## 2016-06-08 LAB — I-STAT CHEM 8, ED
BUN: 6 mg/dL (ref 6–20)
Calcium, Ion: 1.07 mmol/L — ABNORMAL LOW (ref 1.15–1.40)
Chloride: 99 mmol/L — ABNORMAL LOW (ref 101–111)
Creatinine, Ser: 0.8 mg/dL (ref 0.61–1.24)
Glucose, Bld: 340 mg/dL — ABNORMAL HIGH (ref 65–99)
HEMATOCRIT: 43 % (ref 39.0–52.0)
HEMOGLOBIN: 14.6 g/dL (ref 13.0–17.0)
POTASSIUM: 4.4 mmol/L (ref 3.5–5.1)
SODIUM: 135 mmol/L (ref 135–145)
TCO2: 27 mmol/L (ref 0–100)

## 2016-06-08 LAB — CBC WITH DIFFERENTIAL/PLATELET
Basophils Absolute: 0 10*3/uL (ref 0.0–0.1)
Basophils Relative: 1 %
EOS ABS: 0.1 10*3/uL (ref 0.0–0.7)
EOS PCT: 1 %
HCT: 39.2 % (ref 39.0–52.0)
Hemoglobin: 13.1 g/dL (ref 13.0–17.0)
LYMPHS ABS: 2.1 10*3/uL (ref 0.7–4.0)
LYMPHS PCT: 37 %
MCH: 27.8 pg (ref 26.0–34.0)
MCHC: 33.4 g/dL (ref 30.0–36.0)
MCV: 83.1 fL (ref 78.0–100.0)
MONO ABS: 0.4 10*3/uL (ref 0.1–1.0)
MONOS PCT: 6 %
NEUTROS ABS: 3.3 10*3/uL (ref 1.7–7.7)
Neutrophils Relative %: 55 %
PLATELETS: 219 10*3/uL (ref 150–400)
RBC: 4.72 MIL/uL (ref 4.22–5.81)
RDW: 12.4 % (ref 11.5–15.5)
WBC: 5.8 10*3/uL (ref 4.0–10.5)

## 2016-06-08 LAB — CBG MONITORING, ED
GLUCOSE-CAPILLARY: 276 mg/dL — AB (ref 65–99)
Glucose-Capillary: 389 mg/dL — ABNORMAL HIGH (ref 65–99)

## 2016-06-08 MED ORDER — SODIUM CHLORIDE 0.9 % IV BOLUS (SEPSIS)
1000.0000 mL | Freq: Once | INTRAVENOUS | Status: AC
  Administered 2016-06-08: 1000 mL via INTRAVENOUS

## 2016-06-08 MED ORDER — LABETALOL HCL 200 MG PO TABS: 200.0000 mg | ORAL_TABLET | Freq: Two times a day (BID) | ORAL | 0 refills | Status: DC

## 2016-06-08 MED ORDER — ENALAPRIL MALEATE 20 MG PO TABS
10.0000 mg | ORAL_TABLET | Freq: Every day | ORAL | 0 refills | Status: DC
Start: 2016-06-08 — End: 2017-02-10

## 2016-06-08 MED ORDER — GLIPIZIDE ER 10 MG PO TB24: 10.0000 mg | ORAL_TABLET | Freq: Every day | ORAL | 0 refills | Status: DC

## 2016-06-08 NOTE — ED Provider Notes (Signed)
[facility]-EMERGENCY DEPT Provider Note   CSN: [id number] Arrival date & time: 06/08/16  7829     History   Chief Complaint Chief Complaint  Patient presents with  . Diabetes/ return to work    HPI Randy [name] is a 36 y.o. male.  HPI Patient presents for a work note. States that he came to the ER on the 21st and was late for work. States he did not get seen in the ER register but was just in the waiting room. States he needs a work note to show he was here on the 21st so he does not get fired. States he went back to work today and states that he was told he still needs a work note the 21st. Also found to have high blood sugar. He is diabetic but has been out of his test strips and needles. Takes his been out of for a couple weeks. States he has been urinating a lot. States feels as if his sugars little high. A fever chills or chest pain. No trouble breathing. States she's also been out of his blood pressure medicines for a couple weeks. No headache. No confusion.   Past Medical History:  Diagnosis Date  . Hypertension   . Pancreatitis   . Type II diabetes mellitus [facility])     Patient Active Problem List   Diagnosis Date Noted  . Intractable nausea and vomiting 08/31/2015  . Hypertensive urgency 08/31/2015  . Uncontrolled diabetes mellitus (HCC) 08/31/2015  . Marijuana abuse 08/31/2015  . Noncompliance with medication regimen 08/31/2015    Past Surgical History:  Procedure Laterality Date  . NO PAST SURGERIES         Home Medications    Prior to Admission medications   Medication Sig Start Date End Date Taking? Authorizing Provider  insulin aspart (NOVOLOG FLEXPEN) 100 UNIT/ML FlexPen Inject 5 Units into the skin 3 (three) times daily with meals. 09/02/15  Yes Maryruth Bun Rama, MD  Insulin Glargine (LANTUS SOLOSTAR) 100 UNIT/ML Solostar Pen Inject 10 Units into the skin daily at 10 pm. 09/02/15  Yes Maryruth Bun Rama, MD  enalapril (VASOTEC) 20 MG tablet Take 0.5 tablets  (10 mg total) by mouth daily. 06/08/16 10/23/20  Benjiman Core, MD  glipiZIDE (GLUCOTROL XL) 10 MG 24 hr tablet Take 1 tablet (10 mg total) by mouth daily with breakfast. 06/08/16   Benjiman Core, MD  Insulin Pen Needle (PEN NEEDLES 3/16") 31G X 5 MM MISC Use as needed with insulin pens. Patient not taking: Reported on 06/08/2016 09/02/15   Maryruth Bun Rama, MD  labetalol (NORMODYNE) 200 MG tablet Take 1 tablet (200 mg total) by mouth 2 (two) times daily. 06/08/16   Benjiman Core, MD  ondansetron (ZOFRAN ODT) 8 MG disintegrating tablet Take 1 tablet (8 mg total) by mouth every 8 (eight) hours as needed for nausea or vomiting. Patient not taking: Reported on 06/08/2016 09/26/15   Margarita Grizzle, MD  oxyCODONE-acetaminophen (PERCOCET/ROXICET) 5-325 MG tablet Take 1 tablet by mouth every 4 (four) hours as needed for severe pain. Patient not taking: Reported on 06/08/2016 09/26/15   Margarita Grizzle, MD    Family History Family History  Problem Relation Age of Onset  . Hypertension Mother   . Diabetes Father   . Hypertension Father     Social History Social History  Substance Use Topics  . Smoking status: Former Smoker    Packs/day: 0.33    Years: 17.00    Types: Cigarettes    Quit date:  12/26/2011  . Smokeless tobacco: Never Used  . Alcohol use No     Allergies   Patient has no known allergies.   Review of Systems Review of Systems  Constitutional: Negative for appetite change.  HENT: Negative for congestion.   Eyes: Negative for photophobia.  Respiratory: Negative for shortness of breath.   Cardiovascular: Negative for chest pain.  Gastrointestinal: Negative for abdominal pain.  Endocrine: Positive for polyuria. Negative for heat intolerance.  Genitourinary: Negative for difficulty urinating.  Musculoskeletal: Negative for back pain.  Neurological: Negative for weakness and numbness.  Hematological: Negative for adenopathy.  Psychiatric/Behavioral: Negative for confusion.      Physical Exam Updated Vital Signs BP (!) 168/114   Pulse (!) 58   Temp 98.1 F (36.7 C) (Oral)   Resp 16   Ht 5\' 8"  (1.727 m)   Wt 200 lb (90.7 kg)   SpO2 100%   BMI 30.41 kg/m   Physical Exam  Constitutional: He appears well-developed.  HENT:  Head: Atraumatic.  Eyes: Pupils are equal, round, and reactive to light.  Neck: Neck supple.  Cardiovascular: Normal rate.   Pulmonary/Chest: Effort normal.  Abdominal: Soft. There is no tenderness.  Musculoskeletal: Normal range of motion.  Neurological: He is alert.  Skin: Skin is warm. Capillary refill takes less than 2 seconds.     ED Treatments / Results  Labs (all labs ordered are listed, but only abnormal results are displayed) Labs Reviewed  CBG MONITORING, ED - Abnormal; Notable for the following:       Result Value   Glucose-Capillary 389 (*)    All other components within normal limits  I-STAT CHEM 8, ED - Abnormal; Notable for the following:    Chloride 99 (*)    Glucose, Bld 340 (*)    Calcium, Ion 1.07 (*)    All other components within normal limits  CBG MONITORING, ED - Abnormal; Notable for the following:    Glucose-Capillary 276 (*)    All other components within normal limits  CBC WITH DIFFERENTIAL/PLATELET    EKG  EKG Interpretation None       Radiology No results found.  Procedures Procedures (including critical care time)  Medications Ordered in ED Medications  sodium chloride 0.9 % bolus 1,000 mL (0 mLs Intravenous Stopped 06/08/16 1045)     Initial Impression / Assessment and Plan / ED Course  I have reviewed the triage vital signs and the nursing notes.  Pertinent labs & imaging results that were available during my care of the patient were reviewed by me and considered in my medical decision making (see chart for details).     Patient with hyperglycemia and hypertension. Noncompliance. Sugar improved. Started back on home meds. Patient had requested a work note for 6 days  ago. He had been reportedly to the ER but did not check in. I told him I cannot give him this note. Will follow-up with the primary care doctor.  Final Clinical Impressions(s) / ED Diagnoses   Final diagnoses:  Hyperglycemia  Essential hypertension  Noncompliance with medication regimen    New Prescriptions Discharge Medication List as of 06/08/2016 11:09 AM       Benjiman CoreNathan Jahnia Hewes, MD 06/08/16 1645

## 2016-06-08 NOTE — ED Triage Notes (Signed)
Patient states that he was here on 1/21 and needs doctors note to return to work. No chart or visit found. States that he was here due to blood sugar running high, no complaints

## 2016-10-01 ENCOUNTER — Encounter (HOSPITAL_COMMUNITY): Payer: Self-pay

## 2016-10-01 ENCOUNTER — Emergency Department (HOSPITAL_COMMUNITY)
Admission: EM | Admit: 2016-10-01 | Discharge: 2016-10-01 | Disposition: A | Discharge status: Home / Self Care | Payer: BLUE CROSS/BLUE SHIELD | Attending: Emergency Medicine | Admitting: Emergency Medicine

## 2016-10-01 DIAGNOSIS — I10 Essential (primary) hypertension: Secondary | ICD-10-CM | POA: Diagnosis present

## 2016-10-01 DIAGNOSIS — Z5321 Procedure and treatment not carried out due to patient leaving prior to being seen by health care provider: Secondary | ICD-10-CM | POA: Diagnosis not present

## 2016-10-01 LAB — BASIC METABOLIC PANEL
ANION GAP: 10 (ref 5–15)
BUN: 10 mg/dL (ref 6–20)
CHLORIDE: 98 mmol/L — AB (ref 101–111)
CO2: 25 mmol/L (ref 22–32)
Calcium: 9.3 mg/dL (ref 8.9–10.3)
Creatinine, Ser: 0.98 mg/dL (ref 0.61–1.24)
GFR calc Af Amer: >60 mL/min (ref >60)
Glucose, Bld: 370 mg/dL — ABNORMAL HIGH (ref 65–99)
POTASSIUM: 4.3 mmol/L (ref 3.5–5.1)
SODIUM: 133 mmol/L — AB (ref 135–145)

## 2016-10-01 LAB — CBC
HEMATOCRIT: 41.1 % (ref 39.0–52.0)
HEMOGLOBIN: 14.1 g/dL (ref 13.0–17.0)
MCH: 28.1 pg (ref 26.0–34.0)
MCHC: 34.3 g/dL (ref 30.0–36.0)
MCV: 82 fL (ref 78.0–100.0)
Platelets: 249 10*3/uL (ref 150–400)
RBC: 5.01 MIL/uL (ref 4.22–5.81)
RDW: 12.4 % (ref 11.5–15.5)
WBC: 9 10*3/uL (ref 4.0–10.5)

## 2016-10-01 LAB — CBG MONITORING, ED: Glucose-Capillary: 336 mg/dL — ABNORMAL HIGH (ref 65–99)

## 2016-10-01 LAB — TROPONIN I

## 2016-10-01 NOTE — ED Notes (Signed)
CBG 336 at triage

## 2016-10-01 NOTE — ED Notes (Signed)
Pt inquiring about wait time, nurse explained delay/process and high census. 

## 2016-10-01 NOTE — ED Triage Notes (Signed)
Pt reports HTN, hyperglycemia, back pain and emesis; onset yesterday but "I just couldn't take it today."

## 2016-10-01 NOTE — ED Notes (Signed)
The pt has been to the desk x 2 since my arrival at 2300  They are upset about the wait  ssking to see the supervisor.  Since then the pt has told the tech that they are elaving

## 2017-02-10 ENCOUNTER — Emergency Department (HOSPITAL_COMMUNITY): Payer: BLUE CROSS/BLUE SHIELD

## 2017-02-10 ENCOUNTER — Encounter (HOSPITAL_COMMUNITY): Payer: Self-pay | Admitting: Emergency Medicine

## 2017-02-10 ENCOUNTER — Emergency Department (HOSPITAL_COMMUNITY)
Admission: EM | Admit: 2017-02-10 | Discharge: 2017-02-11 | Disposition: A | Discharge status: Home / Self Care | Payer: BLUE CROSS/BLUE SHIELD | Attending: Emergency Medicine | Admitting: Emergency Medicine

## 2017-02-10 DIAGNOSIS — R112 Nausea with vomiting, unspecified: Secondary | ICD-10-CM | POA: Diagnosis not present

## 2017-02-10 DIAGNOSIS — Z79899 Other long term (current) drug therapy: Secondary | ICD-10-CM | POA: Insufficient documentation

## 2017-02-10 DIAGNOSIS — K859 Acute pancreatitis without necrosis or infection, unspecified: Secondary | ICD-10-CM

## 2017-02-10 DIAGNOSIS — Z7984 Long term (current) use of oral hypoglycemic drugs: Secondary | ICD-10-CM | POA: Insufficient documentation

## 2017-02-10 DIAGNOSIS — I10 Essential (primary) hypertension: Secondary | ICD-10-CM | POA: Diagnosis not present

## 2017-02-10 DIAGNOSIS — E119 Type 2 diabetes mellitus without complications: Secondary | ICD-10-CM | POA: Diagnosis not present

## 2017-02-10 DIAGNOSIS — Z87891 Personal history of nicotine dependence: Secondary | ICD-10-CM | POA: Insufficient documentation

## 2017-02-10 DIAGNOSIS — R079 Chest pain, unspecified: Secondary | ICD-10-CM | POA: Diagnosis present

## 2017-02-10 LAB — CBC
HEMATOCRIT: 40.6 % (ref 39.0–52.0)
HEMOGLOBIN: 13.7 g/dL (ref 13.0–17.0)
MCH: 27.5 pg (ref 26.0–34.0)
MCHC: 33.7 g/dL (ref 30.0–36.0)
MCV: 81.4 fL (ref 78.0–100.0)
Platelets: 301 10*3/uL (ref 150–400)
RBC: 4.99 MIL/uL (ref 4.22–5.81)
RDW: 12.3 % (ref 11.5–15.5)
WBC: 7 10*3/uL (ref 4.0–10.5)

## 2017-02-10 LAB — CBG MONITORING, ED: GLUCOSE-CAPILLARY: 279 mg/dL — AB (ref 65–99)

## 2017-02-10 LAB — BASIC METABOLIC PANEL
ANION GAP: 12 (ref 5–15)
BUN: 8 mg/dL (ref 6–20)
CHLORIDE: 95 mmol/L — AB (ref 101–111)
CO2: 25 mmol/L (ref 22–32)
Calcium: 9.2 mg/dL (ref 8.9–10.3)
Creatinine, Ser: 0.79 mg/dL (ref 0.61–1.24)
GFR calc Af Amer: >60 mL/min (ref >60)
GFR calc non Af Amer: >60 mL/min (ref >60)
GLUCOSE: 289 mg/dL — AB (ref 65–99)
POTASSIUM: 4 mmol/L (ref 3.5–5.1)
Sodium: 132 mmol/L — ABNORMAL LOW (ref 135–145)

## 2017-02-10 LAB — I-STAT TROPONIN, ED: Troponin i, poc: 0.01 ng/mL (ref 0.00–0.08)

## 2017-02-10 MED ORDER — SODIUM CHLORIDE 0.9 % IV BOLUS (SEPSIS)
1000.0000 mL | Freq: Once | INTRAVENOUS | Status: AC
  Administered 2017-02-10: 1000 mL via INTRAVENOUS

## 2017-02-10 MED ORDER — DICYCLOMINE HCL 10 MG/ML IM SOLN
20.0000 mg | Freq: Once | INTRAMUSCULAR | Status: AC
  Administered 2017-02-10: 20 mg via INTRAMUSCULAR
  Filled 2017-02-10: qty 2

## 2017-02-10 MED ORDER — ONDANSETRON HCL 4 MG/2ML IJ SOLN
4.0000 mg | Freq: Once | INTRAMUSCULAR | Status: AC
  Administered 2017-02-10: 4 mg via INTRAVENOUS
  Filled 2017-02-10: qty 2

## 2017-02-10 NOTE — ED Triage Notes (Signed)
Pt to ER with complaint of chest pain and abdominal pain with nausea and vomiting onset today. States recently admitted to hospital in [place] for pancreatitis. Pt wife states patient is noncompliant with medications.

## 2017-02-10 NOTE — ED Notes (Signed)
Called lab to add on lab work 

## 2017-02-11 LAB — HEPATIC FUNCTION PANEL
ALBUMIN: 4 g/dL (ref 3.5–5.0)
ALT: 19 U/L (ref 17–63)
AST: 32 U/L (ref 15–41)
Alkaline Phosphatase: 85 U/L (ref 38–126)
Bilirubin, Direct: 0.3 mg/dL (ref 0.1–0.5)
Indirect Bilirubin: 1 mg/dL — ABNORMAL HIGH (ref 0.3–0.9)
Total Bilirubin: 1.3 mg/dL — ABNORMAL HIGH (ref 0.3–1.2)
Total Protein: 7.4 g/dL (ref 6.5–8.1)

## 2017-02-11 LAB — LIPASE, BLOOD: LIPASE: 285 U/L — AB (ref 11–51)

## 2017-02-11 MED ORDER — KETOROLAC TROMETHAMINE 15 MG/ML IJ SOLN
15.0000 mg | Freq: Once | INTRAMUSCULAR | Status: AC
  Administered 2017-02-11: 15 mg via INTRAVENOUS
  Filled 2017-02-11: qty 1

## 2017-02-11 MED ORDER — OXYCODONE HCL 5 MG PO TABS: 5.0000 mg | ORAL_TABLET | ORAL | 0 refills | Status: DC | PRN

## 2017-02-11 MED ORDER — ONDANSETRON 4 MG PO TBDP: 4.0000 mg | ORAL_TABLET | Freq: Three times a day (TID) | ORAL | 0 refills | Status: DC | PRN

## 2017-02-11 MED ORDER — DICYCLOMINE HCL 20 MG PO TABS: 20.0000 mg | ORAL_TABLET | Freq: Two times a day (BID) | ORAL | 0 refills | Status: DC | PRN

## 2017-02-11 NOTE — ED Notes (Signed)
Pt given broth per dr Dalene Seltzer and sprite

## 2017-02-11 NOTE — Discharge Instructions (Signed)
Take Tylenol 1000 mg 4 times a day for 1 week. This is the maximum dose of Tylenol (acetaminophen) you can take from all sources. Please check other over-the-counter medications and prescriptions to ensure you are not taking other medications that contain acetaminophen.  You may also take ibuprofen 400 mg 6 times a day alternating with or at the same time as tylenol.  Take this with something on your stomach. Take oxycodone as needed for breakthrough pain.  This medication can be addicting, sedating and cause constipation.

## 2017-02-11 NOTE — ED Provider Notes (Signed)
[facility]-EMERGENCY DEPT Provider Note   CSN: [id number] Arrival date & time: 02/10/17  1723     History   Chief Complaint Chief Complaint  Patient presents with  . Chest Pain  . Emesis    HPI Randy [name] is a 36 y.o. male.  HPI   36 year male with a history of hypertension and diabetes as well as pancreatitis presents with concern for epigastric abdominal pain, nausea and vomiting. Reports he was hospitalized in [place] 2 weeks ago with concern for pancreatitis at that time. Reports that he had some mild continuing discomfort that headache continued since then with pain in the epigastrium and back, and went to see primary care physician, he was written a prescription for tramadol. Reports after taking the tramadol today, he suddenly developed worsening epigastric and back pain, with associated nausea and vomiting. Reports severe vomiting for about 2-3 hours after taking the medication. Reports the pain is severe. He is never taken that medication before. Reports that he did drink alcohol in [place], and that may have been etiology of pancreatitis at that time. Reports his symptoms had improved slightly prior to suddenly worsening today. Denies fevers, urinary symptoms, diarrhea, constipation. Has no history of abdominal surgery. He is passing flatus.  Past Medical History:  Diagnosis Date  . Hypertension   . Pancreatitis   . Type II diabetes mellitus [facility])     Patient Active Problem List   Diagnosis Date Noted  . Intractable nausea and vomiting 08/31/2015  . Hypertensive urgency 08/31/2015  . Uncontrolled diabetes mellitus (HCC) 08/31/2015  . Marijuana abuse 08/31/2015  . Noncompliance with medication regimen 08/31/2015    Past Surgical History:  Procedure Laterality Date  . NO PAST SURGERIES         Home Medications    Prior to Admission medications   Medication Sig Start Date End Date Taking? Authorizing Provider  carvedilol (COREG) 12.5 MG tablet Take 12.5 mg by  mouth 2 (two) times daily with a meal.   Yes [provider]  glipiZIDE (GLUCOTROL) 10 MG tablet Take 10 mg by mouth daily before breakfast.   Yes [provider]  lisinopril (PRINIVIL,ZESTRIL) 20 MG tablet Take 20 mg by mouth daily.   Yes [provider]  traMADol (ULTRAM) 50 MG tablet Take 50 mg by mouth every 6 (six) hours as needed for moderate pain.   Yes [provider]  dicyclomine (BENTYL) 20 MG tablet Take 1 tablet (20 mg total) by mouth 2 (two) times daily as needed for spasms (abdominal pain). 02/11/17   Alvira Monday, MD  ondansetron (ZOFRAN ODT) 4 MG disintegrating tablet Take 1 tablet (4 mg total) by mouth every 8 (eight) hours as needed for nausea or vomiting. 02/11/17   Alvira Monday, MD  oxyCODONE (ROXICODONE) 5 MG immediate release tablet Take 1 tablet (5 mg total) by mouth every 4 (four) hours as needed for severe pain. 02/11/17   Alvira Monday, MD    Family History Family History  Problem Relation Age of Onset  . Hypertension Mother   . Diabetes Father   . Hypertension Father     Social History Social History  Substance Use Topics  . Smoking status: Former Smoker    Packs/day: 0.33    Years: 17.00    Types: Cigarettes    Quit date: 12/26/2011  . Smokeless tobacco: Never Used  . Alcohol use No     Allergies   Patient has no known allergies.   Review of Systems Review  of Systems  Constitutional: Negative for fever.  HENT: Negative for sore throat.   Eyes: Negative for visual disturbance.  Respiratory: Negative for shortness of breath (only with emesis).   Cardiovascular: Positive for chest pain (only with emesis).  Gastrointestinal: Positive for abdominal pain, nausea and vomiting. Negative for constipation and diarrhea.  Genitourinary: Negative for difficulty urinating and dysuria.  Musculoskeletal: Negative for back pain and neck stiffness.  Skin: Negative for rash.  Neurological: Negative for syncope and  headaches.     Physical Exam Updated Vital Signs BP (!) 139/91   Pulse 69   Temp 99.1 F (37.3 C) (Oral)   Resp 14   SpO2 98%   Physical Exam  Constitutional: He is oriented to person, place, and time. He appears well-developed and well-nourished. No distress.  HENT:  Head: Normocephalic and atraumatic.  Eyes: Conjunctivae and EOM are normal.  Neck: Normal range of motion.  Cardiovascular: Normal rate, regular rhythm, normal heart sounds and intact distal pulses.  Exam reveals no gallop and no friction rub.   No murmur heard. Pulmonary/Chest: Effort normal and breath sounds normal. No respiratory distress. He has no wheezes. He has no rales.  Abdominal: Soft. He exhibits no distension. There is tenderness (mild epigastric). There is no guarding.  Musculoskeletal: He exhibits no edema.  Neurological: He is alert and oriented to person, place, and time.  Skin: Skin is warm and dry. He is not diaphoretic.  Nursing note and vitals reviewed.    ED Treatments / Results  Labs (all labs ordered are listed, but only abnormal results are displayed) Labs Reviewed  BASIC METABOLIC PANEL - Abnormal; Notable for the following:       Result Value   Sodium 132 (*)    Chloride 95 (*)    Glucose, Bld 289 (*)    All other components within normal limits  LIPASE, BLOOD - Abnormal; Notable for the following:    Lipase 285 (*)    All other components within normal limits  HEPATIC FUNCTION PANEL - Abnormal; Notable for the following:    Total Bilirubin 1.3 (*)    Indirect Bilirubin 1.0 (*)    All other components within normal limits  CBG MONITORING, ED - Abnormal; Notable for the following:    Glucose-Capillary 279 (*)    All other components within normal limits  CBC  URINALYSIS, ROUTINE W REFLEX MICROSCOPIC  I-STAT TROPONIN, ED    EKG  EKG Interpretation  Date/Time:  Monday February 10 2017 17:28:41 EDT Ventricular Rate:  74 PR Interval:  170 QRS Duration: 88 QT  Interval:  382 QTC Calculation: 424 R Axis:   91 Text Interpretation:  Normal sinus rhythm Rightward axis ST elevation, consider early repolarization Borderline ECG No significant change since last tracing Confirmed by Alvira Monday ([id number]) on 02/10/2017 9:29:19 PM       Radiology Dg Chest 2 View  Result Date: 02/10/2017 CLINICAL DATA:  Chest pain EXAM: CHEST  2 VIEW COMPARISON:  None. FINDINGS: The heart size and mediastinal contours are within normal limits. Both lungs are clear. The visualized skeletal structures are unremarkable. IMPRESSION: No active cardiopulmonary disease. Electronically Signed   By: Jasmine Pang M.D.   On: 02/10/2017 18:01    Procedures Procedures (including critical care time)  Medications Ordered in ED Medications  sodium chloride 0.9 % bolus 1,000 mL (0 mLs Intravenous Stopped 02/10/17 2300)  ondansetron (ZOFRAN) injection 4 mg (4 mg Intravenous Given 02/10/17 2152)  dicyclomine (BENTYL) injection 20 mg (20  mg Intramuscular Given 02/10/17 2153)  ketorolac (TORADOL) 15 MG/ML injection 15 mg (15 mg Intravenous Given 02/11/17 0210)     Initial Impression / Assessment and Plan / ED Course  I have reviewed the triage vital signs and the nursing notes.  Pertinent labs & imaging results that were available during my care of the patient were reviewed by me and considered in my medical decision making (see chart for details).     36 year male with a history of hypertension and diabetes as well as pancreatitis presents with concern for epigastric abdominal pain, nausea and vomiting. Patient also reported that he had chest pain with vomiting. EKG and troponin were done in triage which showed no acute findings. Chest x-ray within normal limits. Abdominal exam with mild epigastric tenderness. His overall benign, do not see signs of appendicitis, cholecystitis. He's having bowel movements and passing flatus and doubt small bowel obstruction. No sign of DKA.   His  lipase is elevated to 285.    Suspect symptoms are secondary to pancreatitis. Patient has history of similar which has been managed as an outpatient. Given his benign abdominal exam, low suspicion for biliary disease, afebrile, no leukocytosis, feel outpatient management is reasonable if he can tolerate po.    Unclear etiology of pancreatitis-suspect etoh however medications may be etiology as well. He is on lisinopril which may be contributor, however suspect recent episode for which he was hospitalized in [place] was due to etoh.  Given overall benefits of lisinopril in pt with DM and htn, hx of recurrent pancreatitis, will recommend PCP follow up to decide on stopping lisinopril as potential pancreatitis contributor and have low suspicion this is etiology. Given pain and emesis worse after tramadol, and this has some reports of causing pancreatitis suspect this also may be etiology.    Gave options to patient including inpt admission versus outpatient with strict follow up.  He prefers outpatient management, is tolerating po.  Looked in [facility] which showed rx from 2017. Will give rx for oxycodone, however recommend ibuprofen, tylenol, and bentyl for primary pain control. Recommend clear diet with advancement as tolerated and GI follow up.      Final Clinical Impressions(s) / ED Diagnoses   Final diagnoses:  Acute pancreatitis, unspecified complication status, unspecified pancreatitis type    New Prescriptions New Prescriptions   DICYCLOMINE (BENTYL) 20 MG TABLET    Take 1 tablet (20 mg total) by mouth 2 (two) times daily as needed for spasms (abdominal pain).   ONDANSETRON (ZOFRAN ODT) 4 MG DISINTEGRATING TABLET    Take 1 tablet (4 mg total) by mouth every 8 (eight) hours as needed for nausea or vomiting.   OXYCODONE (ROXICODONE) 5 MG IMMEDIATE RELEASE TABLET    Take 1 tablet (5 mg total) by mouth every 4 (four) hours as needed for severe pain.     Alvira Monday, MD 02/11/17  [phone]

## 2017-09-16 ENCOUNTER — Other Ambulatory Visit: Payer: Self-pay

## 2017-09-16 ENCOUNTER — Ambulatory Visit (HOSPITAL_COMMUNITY)
Admission: EM | Admit: 2017-09-16 | Discharge: 2017-09-16 | Discharge status: Against Medical Advice | Payer: BLUE CROSS/BLUE SHIELD | Attending: Family Medicine | Admitting: Family Medicine

## 2017-09-16 ENCOUNTER — Encounter (HOSPITAL_COMMUNITY): Payer: Self-pay | Admitting: Emergency Medicine

## 2017-09-16 DIAGNOSIS — L739 Follicular disorder, unspecified: Secondary | ICD-10-CM

## 2017-09-16 MED ORDER — SULFAMETHOXAZOLE-TRIMETHOPRIM 800-160 MG PO TABS
1.0000 | ORAL_TABLET | Freq: Two times a day (BID) | ORAL | 0 refills | Status: AC
Start: 2017-09-16 — End: 2017-09-26

## 2017-09-16 NOTE — ED Triage Notes (Signed)
Pt has a small bump on his right cheek/chin that he states has been there for years, but two days ago it developed into something bigger.
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
## 2017-09-28 ENCOUNTER — Emergency Department (HOSPITAL_COMMUNITY)
Admission: EM | Admit: 2017-09-28 | Discharge: 2017-09-28 | Disposition: A | Discharge status: Home / Self Care | Payer: Self-pay | Attending: Emergency Medicine | Admitting: Emergency Medicine

## 2017-09-28 DIAGNOSIS — R739 Hyperglycemia, unspecified: Secondary | ICD-10-CM

## 2017-09-28 DIAGNOSIS — Z7984 Long term (current) use of oral hypoglycemic drugs: Secondary | ICD-10-CM | POA: Insufficient documentation

## 2017-09-28 DIAGNOSIS — R112 Nausea with vomiting, unspecified: Secondary | ICD-10-CM | POA: Insufficient documentation

## 2017-09-28 DIAGNOSIS — Z87891 Personal history of nicotine dependence: Secondary | ICD-10-CM | POA: Insufficient documentation

## 2017-09-28 DIAGNOSIS — I10 Essential (primary) hypertension: Secondary | ICD-10-CM | POA: Insufficient documentation

## 2017-09-28 DIAGNOSIS — E1165 Type 2 diabetes mellitus with hyperglycemia: Secondary | ICD-10-CM | POA: Insufficient documentation

## 2017-09-28 LAB — I-STAT VENOUS BLOOD GAS, ED
ACID-BASE DEFICIT: 2 mmol/L (ref 0.0–2.0)
BICARBONATE: 23.1 mmol/L (ref 20.0–28.0)
O2 Saturation: 52 %
PH VEN: 7.363 (ref 7.250–7.430)
PO2 VEN: 29 mmHg — AB (ref 32.0–45.0)
TCO2: 24 mmol/L (ref 22–32)
pCO2, Ven: 40.5 mmHg — ABNORMAL LOW (ref 44.0–60.0)

## 2017-09-28 LAB — URINALYSIS, ROUTINE W REFLEX MICROSCOPIC
BILIRUBIN URINE: NEGATIVE
Bacteria, UA: NONE SEEN
KETONES UR: 20 mg/dL — AB
LEUKOCYTES UA: NEGATIVE
NITRITE: NEGATIVE
Protein, ur: 100 mg/dL — AB
Specific Gravity, Urine: 1.027 (ref 1.005–1.030)
pH: 7 (ref 5.0–8.0)

## 2017-09-28 LAB — COMPREHENSIVE METABOLIC PANEL
ALBUMIN: 3.5 g/dL (ref 3.5–5.0)
ALT: 28 U/L (ref 17–63)
ANION GAP: 11 (ref 5–15)
AST: 40 U/L (ref 15–41)
Alkaline Phosphatase: 112 U/L (ref 38–126)
BUN: 12 mg/dL (ref 6–20)
CHLORIDE: 99 mmol/L — AB (ref 101–111)
CO2: 23 mmol/L (ref 22–32)
Calcium: 8.9 mg/dL (ref 8.9–10.3)
Creatinine, Ser: 0.88 mg/dL (ref 0.61–1.24)
GFR calc non Af Amer: >60 mL/min (ref >60)
Glucose, Bld: 370 mg/dL — ABNORMAL HIGH (ref 65–99)
POTASSIUM: 4 mmol/L (ref 3.5–5.1)
SODIUM: 133 mmol/L — AB (ref 135–145)
TOTAL PROTEIN: 7.2 g/dL (ref 6.5–8.1)
Total Bilirubin: 1.3 mg/dL — ABNORMAL HIGH (ref 0.3–1.2)

## 2017-09-28 LAB — CBC
HCT: 39.2 % (ref 39.0–52.0)
Hemoglobin: 12.9 g/dL — ABNORMAL LOW (ref 13.0–17.0)
MCH: 26.9 pg (ref 26.0–34.0)
MCHC: 32.9 g/dL (ref 30.0–36.0)
MCV: 81.8 fL (ref 78.0–100.0)
Platelets: 293 10*3/uL (ref 150–400)
RBC: 4.79 MIL/uL (ref 4.22–5.81)
RDW: 12.2 % (ref 11.5–15.5)
WBC: 9.4 10*3/uL (ref 4.0–10.5)

## 2017-09-28 LAB — I-STAT TROPONIN, ED
Troponin i, poc: 0 ng/mL (ref 0.00–0.08)
Troponin i, poc: 0.02 ng/mL (ref 0.00–0.08)

## 2017-09-28 LAB — LIPASE, BLOOD: LIPASE: 33 U/L (ref 11–51)

## 2017-09-28 LAB — CBG MONITORING, ED: GLUCOSE-CAPILLARY: 341 mg/dL — AB (ref 65–99)

## 2017-09-28 MED ORDER — PROMETHAZINE HCL 25 MG/ML IJ SOLN
25.0000 mg | Freq: Once | INTRAMUSCULAR | Status: AC
  Administered 2017-09-28: 25 mg via INTRAVENOUS
  Filled 2017-09-28: qty 1

## 2017-09-28 MED ORDER — ONDANSETRON HCL 4 MG PO TABS: 4.0000 mg | ORAL_TABLET | Freq: Four times a day (QID) | ORAL | 0 refills | Status: DC

## 2017-09-28 MED ORDER — SODIUM CHLORIDE 0.9 % IV BOLUS
1000.0000 mL | Freq: Once | INTRAVENOUS | Status: AC
  Administered 2017-09-28: 1000 mL via INTRAVENOUS

## 2017-09-28 NOTE — ED Provider Notes (Signed)
[facility] EMERGENCY DEPARTMENT Provider Note   CSN: [id number] Arrival date & time: 09/28/17  1414     History   Chief Complaint Chief Complaint  Patient presents with  . Nausea  . Chest Pain  . Abdominal Pain    HPI Randy [name] is a 37 y.o. male.  He is a limited historian secondary to the acuity of condition.  EMS [place] here from church with acute onset of abdominal pain followed by nausea and vomiting and then chest pain.  Patient is able to nod and state that is been going on about 3 hours.  He received some Zofran during transport and he continues to retch and dry heaves here.  EMS also passed through that his Randy [name] states he is been noncompliant with his hypertension and diabetes medicines.  Blood sugar was elevated in the 300s here.  The history is provided by the patient. The history is limited by the condition of the patient.  Chest Pain   This is a new problem. The current episode started less than 1 hour ago. Associated symptoms include abdominal pain, diaphoresis, nausea and vomiting. Pertinent negatives include no back pain, no cough, no fever, no headaches and no shortness of breath. He has tried rest for the symptoms.  His past medical history is significant for diabetes and hypertension.  Pertinent negatives for past medical history include no seizures.  Abdominal Pain   Associated symptoms include nausea and vomiting. Pertinent negatives include fever, dysuria, hematuria and headaches.    Past Medical History:  Diagnosis Date  . Hypertension   . Pancreatitis   . Type II diabetes mellitus [facility])     Patient Active Problem List   Diagnosis Date Noted  . Intractable nausea and vomiting 08/31/2015  . Hypertensive urgency 08/31/2015  . Uncontrolled diabetes mellitus (HCC) 08/31/2015  . Marijuana abuse 08/31/2015  . Noncompliance with medication regimen 08/31/2015    Past Surgical History:  Procedure Laterality Date  . NO PAST  SURGERIES          Home Medications    Prior to Admission medications   Medication Sig Start Date End Date Taking? Authorizing Provider  carvedilol (COREG) 12.5 MG tablet Take 12.5 mg by mouth 2 (two) times daily with a meal.    [provider]  dicyclomine (BENTYL) 20 MG tablet Take 1 tablet (20 mg total) by mouth 2 (two) times daily as needed for spasms (abdominal pain). 02/11/17   Alvira Monday, MD  glipiZIDE (GLUCOTROL) 10 MG tablet Take 10 mg by mouth daily before breakfast.    [provider]  lisinopril (PRINIVIL,ZESTRIL) 20 MG tablet Take 20 mg by mouth daily.    [provider]  ondansetron (ZOFRAN ODT) 4 MG disintegrating tablet Take 1 tablet (4 mg total) by mouth every 8 (eight) hours as needed for nausea or vomiting. 02/11/17   Alvira Monday, MD  oxyCODONE (ROXICODONE) 5 MG immediate release tablet Take 1 tablet (5 mg total) by mouth every 4 (four) hours as needed for severe pain. 02/11/17   Alvira Monday, MD  traMADol (ULTRAM) 50 MG tablet Take 50 mg by mouth every 6 (six) hours as needed for moderate pain.    [provider]    Family History Family History  Problem Relation Age of Onset  . Hypertension Mother   . Diabetes Father   . Hypertension Father     Social History Social History   Tobacco Use  . Smoking status: Former Smoker  Packs/day: 0.33    Years: 17.00    Pack years: 5.61    Types: Cigarettes    Last attempt to quit: 12/26/2011    Years since quitting: 5.7  . Smokeless tobacco: Never Used  Substance Use Topics  . Alcohol use: No  . Drug use: Yes    Types: Marijuana    Comment: 08/31/2015 "frequent times/day"     Allergies   Patient has no known allergies.   Review of Systems Review of Systems  Unable to perform ROS: Acuity of condition  Constitutional: Positive for diaphoresis. Negative for chills and fever.  HENT: Negative for ear pain and sore throat.   Eyes: Negative for pain and visual  disturbance.  Respiratory: Negative for cough and shortness of breath.   Cardiovascular: Positive for chest pain.  Gastrointestinal: Positive for abdominal pain, nausea and vomiting.  Genitourinary: Negative for dysuria and hematuria.  Musculoskeletal: Negative for back pain and neck pain.  Skin: Negative for rash and wound.  Neurological: Negative for seizures and headaches.     Physical Exam Updated Vital Signs BP (!) 196/118 (BP Location: Left Arm)   Pulse 64   Temp 97.8 F (36.6 C) (Oral)   Resp 19   Ht  (1.727 m)   Wt 90.7 kg (200 lb)   SpO2 100%   BMI 30.41 kg/m   Physical Exam  Constitutional: He appears well-developed and well-nourished. He appears distressed.  HENT:  Head: Normocephalic and atraumatic.  Eyes: Conjunctivae are normal.  Neck: Neck supple.  Cardiovascular: Normal rate and regular rhythm.  No murmur heard. Pulmonary/Chest: Effort normal and breath sounds normal. No respiratory distress.  Abdominal: Soft. He exhibits no mass. There is no tenderness.  Musculoskeletal: Normal range of motion. He exhibits no edema.       Right lower leg: He exhibits no tenderness and no edema.       Left lower leg: He exhibits no tenderness and no edema.  Neurological: He is alert. He has normal strength. No sensory deficit. GCS eye subscore is 4. GCS verbal subscore is 5. GCS motor subscore is 6.  Skin: Skin is warm and dry.  Psychiatric: He has a normal mood and affect.  Nursing note and vitals reviewed.    ED Treatments / Results  Labs (all labs ordered are listed, but only abnormal results are displayed) Labs Reviewed  COMPREHENSIVE METABOLIC PANEL - Abnormal; Notable for the following components:      Result Value   Sodium 133 (*)    Chloride 99 (*)    Glucose, Bld 370 (*)    Total Bilirubin 1.3 (*)    All other components within normal limits  CBC - Abnormal; Notable for the following components:   Hemoglobin 12.9 (*)    All other components within  normal limits  URINALYSIS, ROUTINE W REFLEX MICROSCOPIC - Abnormal; Notable for the following components:   Color, Urine STRAW (*)    Glucose, UA >=500 (*)    Hgb urine dipstick SMALL (*)    Ketones, ur 20 (*)    Protein, ur 100 (*)    All other components within normal limits  CBG MONITORING, ED - Abnormal; Notable for the following components:   Glucose-Capillary 341 (*)    All other components within normal limits  I-STAT VENOUS BLOOD GAS, ED - Abnormal; Notable for the following components:   pCO2, Ven 40.5 (*)    pO2, Ven 29.0 (*)    All other components within normal limits  LIPASE, BLOOD  I-STAT TROPONIN, ED  I-STAT TROPONIN, ED    EKG EKG Interpretation  Date/Time:  Sunday Sep 28 2017 14:23:32 EDT Ventricular Rate:  59 PR Interval:    QRS Duration: 86 QT Interval:  405 QTC Calculation: 402 R Axis:   42 Text Interpretation:  Sinus rhythm Baseline wander in lead(s) I II aVR j point elevation new inverted t 3 compared with prior 10/18 Confirmed by Meridee Score [id number]) on 09/28/2017 2:28:08 PM   Radiology No results found.  Procedures Procedures (including critical care time)  Medications Ordered in ED Medications  sodium chloride 0.9 % bolus 1,000 mL (0 mLs Intravenous Stopped 09/28/17 1620)  promethazine (PHENERGAN) injection 25 mg (25 mg Intravenous Given 09/28/17 1444)     Initial Impression / Assessment and Plan / ED Course  I have reviewed the triage vital signs and the nursing notes.  Pertinent labs & imaging results that were available during my care of the patient were reviewed by me and considered in my medical decision making (see chart for details).  Clinical Course as of Sep 29 1105  Sun Sep 28, 2017  1436 Difficult to get any story of the patient because he continues to just retch into a bag.  He is diaphoretic.  Hypertensive.  Ordering IV fluids and Phenergan for his nausea.  Reviewed prior notes and patient has a history of pancreatitis and  intractable nausea vomiting.   [MB]  1503 Labs starting come back and troponin normal.  VBG with normal pH.   [MB]  1507 Reevaluated patient.  He is resting comfortably sleeping.  Blood pressure still elevated but sats good and vomiting stopped.  Randy [name] here now when she is feeling a little bit more history.  Sounds like he does not take his blood pressure medicine or his diabetes medicine very often.  He been complaining of some back pain over the last 3 months and a little bit of upper abdominal pain but today was acutely not feeling well and then had this acute nausea vomiting sweats chills.  No sick contacts at home.  She does not think is been drinking for months.   [MB]  1645 Reevaluated patient.  His Randy [name] said he woke up briefly urinated and call back in bed and asked for some extra blankets.  Is currently still again resting.  His labs demonstrate the maybe was a little bit dehydrated but no other obvious findings.  Going to repeat a troponin.  If he continues to improve and wakes up and feels better he likely would be discharged.   [MB]  1726 Reevaluated patient had to wake him up.  He states he feels improved and understands were putting in a discharge him if he can p.o. trial and ambulate safely.   [MB]  1810 Still having difficulty keeping this patient awake he keeps falling asleep.  When he is awake he denies having any complaints and states he is just tired.  We will order him a head CT just make sure were not missing something there.   [MB]  1841 Reevaluated patient states he feels back to baseline now he is got no headache no eye pain no nausea no vomiting.  He is tolerated p.o.  He feels like he would like to go home.  His Randy [name] is here and comfortable taking him home.   [MB]    Clinical Course User Index [MB] Terrilee Files, MD     Final Clinical Impressions(s) / ED Diagnoses  Final diagnoses:  Non-intractable vomiting with nausea, unspecified vomiting type  Hypertension,  unspecified type  Hyperglycemia    ED Discharge Orders        Ordered    ondansetron (ZOFRAN) 4 MG tablet  Every 6 hours,   Status:  Discontinued     09/28/17 1850    ondansetron (ZOFRAN) 4 MG tablet  Every 6 hours     09/28/17 1852       Terrilee Files, MD 09/29/17 1109

## 2017-09-28 NOTE — ED Triage Notes (Signed)
Pt arrives via EMS with reports of sudden abd pain [place] sitting at [place]. Pt reports CP that started after the N/V and left eye pain. EMS gave 500 cc bolus and 8 mg zofran with no relief.

## 2017-09-28 NOTE — ED Notes (Signed)
Pt provided with diet ginger ale and saltine crackers.

## 2017-09-28 NOTE — Discharge Instructions (Addendum)
Your evaluated in the emergency department for nausea vomiting elevated blood pressure elevated blood sugar.  Your symptoms improved with some fluid and some medication.  You will need to get set up with a primary care doctor and continue to manage your high blood pressure and your diabetes.  We are prescribing you some nausea medicine in case your symptoms recur.  Please return to the emergency department if any worsening symptoms.

## 2017-09-28 NOTE — ED Notes (Signed)
Wife [phone]

## 2017-10-10 ENCOUNTER — Encounter (HOSPITAL_COMMUNITY): Payer: Self-pay | Admitting: Family Medicine

## 2017-10-10 ENCOUNTER — Ambulatory Visit (HOSPITAL_COMMUNITY)
Admission: EM | Admit: 2017-10-10 | Discharge: 2017-10-10 | Disposition: A | Discharge status: Home / Self Care | Payer: Self-pay | Attending: Family Medicine | Admitting: Family Medicine

## 2017-10-10 ENCOUNTER — Ambulatory Visit (INDEPENDENT_AMBULATORY_CARE_PROVIDER_SITE_OTHER): Payer: Self-pay

## 2017-10-10 DIAGNOSIS — T148XXA Other injury of unspecified body region, initial encounter: Secondary | ICD-10-CM

## 2017-10-10 DIAGNOSIS — E119 Type 2 diabetes mellitus without complications: Secondary | ICD-10-CM

## 2017-10-10 DIAGNOSIS — R2241 Localized swelling, mass and lump, right lower limb: Secondary | ICD-10-CM

## 2017-10-10 DIAGNOSIS — W01198A Fall on same level from slipping, tripping and stumbling with subsequent striking against other object, initial encounter: Secondary | ICD-10-CM

## 2017-10-10 DIAGNOSIS — M79661 Pain in right lower leg: Secondary | ICD-10-CM

## 2017-10-10 DIAGNOSIS — S8011XA Contusion of right lower leg, initial encounter: Secondary | ICD-10-CM

## 2017-10-10 DIAGNOSIS — Z23 Encounter for immunization: Secondary | ICD-10-CM

## 2017-10-10 LAB — GLUCOSE, CAPILLARY: Glucose-Capillary: 386 mg/dL — ABNORMAL HIGH (ref 65–99)

## 2017-10-10 MED ORDER — TETANUS-DIPHTH-ACELL PERTUSSIS 5-2.5-18.5 LF-MCG/0.5 IM SUSP
0.5000 mL | Freq: Once | INTRAMUSCULAR | Status: AC
  Administered 2017-10-10: 0.5 mL via INTRAMUSCULAR

## 2017-10-10 MED ORDER — IBUPROFEN 800 MG PO TABS
ORAL_TABLET | ORAL | Status: AC
  Filled 2017-10-10: qty 1

## 2017-10-10 MED ORDER — CYCLOBENZAPRINE HCL 10 MG PO TABS: 10.0000 mg | ORAL_TABLET | Freq: Two times a day (BID) | ORAL | 0 refills | Status: DC | PRN

## 2017-10-10 MED ORDER — IBUPROFEN 800 MG PO TABS: 800.0000 mg | ORAL_TABLET | Freq: Three times a day (TID) | ORAL | 0 refills | Status: DC | PRN

## 2017-10-10 MED ORDER — IBUPROFEN 800 MG PO TABS
800.0000 mg | ORAL_TABLET | Freq: Once | ORAL | Status: AC
  Administered 2017-10-10: 800 mg via ORAL

## 2017-10-10 MED ORDER — TETANUS-DIPHTH-ACELL PERTUSSIS 5-2.5-18.5 LF-MCG/0.5 IM SUSP
INTRAMUSCULAR | Status: AC
  Filled 2017-10-10: qty 0.5

## 2017-10-10 NOTE — ED Triage Notes (Signed)
Pt here for right calf pain after hitting leg on a rock today, pt reports sig pain and unable to put weight on the leg. Reports that ankle and foot feel okay.

## 2017-10-10 NOTE — Discharge Instructions (Signed)
Stay off of the leg as much as possible for the next couple of days. Ice for 20 minutes every couple of hours for the next 24 to 48 hours Gentle stretching of the ankle and calf at least twice a day Take ibuprofen 800 mg 3 times a day with food for pain Take Flexeril twice a day as needed for muscle relaxer Expect gradual improvement.  Return if worse or if you see reduced circulation to the foot.

## 2017-10-10 NOTE — ED Provider Notes (Signed)
[facility]    CSN: [id number] Arrival date & time: 10/10/17  1430     History   Chief Complaint Chief Complaint  Patient presents with  . Leg Pain    HPI Randy [name] is a 37 y.o. male.   HPI  States that he was carrying a bag of concrete when he tripped.  He could see what was in front of him.  He fell and hit his calf forcibly on a toolbox.  There is since then he has calf pain and swelling.  Pain with weightbearing on the right leg.  Pain with any range of motion of the ankle. He states he is usually in good health.  He is a compliant diabetic.  High blood pressure.  Takes medicine as prescribed. This was not a work injury.  Past Medical History:  Diagnosis Date  . Hypertension   . Pancreatitis   . Type II diabetes mellitus [facility])     Patient Active Problem List   Diagnosis Date Noted  . Intractable nausea and vomiting 08/31/2015  . Hypertensive urgency 08/31/2015  . Uncontrolled diabetes mellitus (HCC) 08/31/2015  . Marijuana abuse 08/31/2015  . Noncompliance with medication regimen 08/31/2015    Past Surgical History:  Procedure Laterality Date  . NO PAST SURGERIES         Home Medications    Prior to Admission medications   Medication Sig Start Date End Date Taking? Authorizing Provider  carvedilol (COREG) 12.5 MG tablet Take 12.5 mg by mouth 2 (two) times daily with a meal.    [provider]  cyclobenzaprine (FLEXERIL) 10 MG tablet Take 1 tablet (10 mg total) by mouth 2 (two) times daily as needed for muscle spasms. 10/10/17   Eustace Moore, MD  dicyclomine (BENTYL) 20 MG tablet Take 1 tablet (20 mg total) by mouth 2 (two) times daily as needed for spasms (abdominal pain). 02/11/17   Alvira Monday, MD  glipiZIDE (GLUCOTROL) 10 MG tablet Take 10 mg by mouth daily before breakfast.    [provider]  ibuprofen (ADVIL,MOTRIN) 800 MG tablet Take 1 tablet (800 mg total) by mouth every 8 (eight) hours as needed for  moderate pain. 10/10/17   Eustace Moore, MD  lisinopril (PRINIVIL,ZESTRIL) 20 MG tablet Take 20 mg by mouth daily.    [provider]  ondansetron (ZOFRAN ODT) 4 MG disintegrating tablet Take 1 tablet (4 mg total) by mouth every 8 (eight) hours as needed for nausea or vomiting. 02/11/17   Alvira Monday, MD  ondansetron (ZOFRAN) 4 MG tablet Take 1 tablet (4 mg total) by mouth every 6 (six) hours. 09/28/17   Terrilee Files, MD    Family History Family History  Problem Relation Age of Onset  . Hypertension Mother   . Diabetes Father   . Hypertension Father     Social History Social History   Tobacco Use  . Smoking status: Former Smoker    Packs/day: 0.33    Years: 17.00    Pack years: 5.61    Types: Cigarettes    Last attempt to quit: 12/26/2011    Years since quitting: 5.7  . Smokeless tobacco: Never Used  Substance Use Topics  . Alcohol use: No  . Drug use: Yes    Types: Marijuana    Comment: 08/31/2015 "frequent times/day"     Allergies   Patient has no known allergies.   Review of Systems Review of Systems  Constitutional: Negative for chills and fever.  HENT: Negative for ear pain and sore throat.   Eyes: Negative for pain and visual disturbance.  Respiratory: Negative for cough and shortness of breath.   Cardiovascular: Negative for chest pain and palpitations.  Gastrointestinal: Negative for abdominal pain and vomiting.  Genitourinary: Negative for dysuria and hematuria.  Musculoskeletal: Positive for gait problem and myalgias. Negative for arthralgias and back pain.  Skin: Negative for color change and rash.  Neurological: Negative for seizures and syncope.  All other systems reviewed and are negative.    Physical Exam Triage Vital Signs ED Triage Vitals  Enc Vitals Group     BP 10/10/17 1536 (!) 160/107     Pulse Rate 10/10/17 1536 85     Resp 10/10/17 1536 18     Temp --      Temp src --      SpO2 10/10/17 1536 100 %     Weight --       Height --      Head Circumference --      Peak Flow --      Pain Score 10/10/17 1537 10     Pain Loc --      Pain Edu? --      Excl. in GC? --    No data found.  Updated Vital Signs BP (!) 160/107   Pulse 85   Resp 18   SpO2 100%   Visual Acuity Right Eye Distance:   Left Eye Distance:   Bilateral Distance:    Right Eye Near:   Left Eye Near:    Bilateral Near:     Physical Exam  Constitutional: He appears well-developed and well-nourished. No distress.  HENT:  Head: Normocephalic and atraumatic.  Mouth/Throat: Oropharynx is clear and moist.  Eyes: Pupils are equal, round, and reactive to light. Conjunctivae are normal.  Neck: Normal range of motion.  Cardiovascular: Normal rate.  Pulmonary/Chest: Effort normal. No respiratory distress.  Abdominal: Soft. He exhibits no distension.  Musculoskeletal: Normal range of motion. He exhibits edema.       Legs: Examined in wheelchair.  Unable to put full weight on injured right leg.  Swelling, tenderness, spasm of medial gastrocnemius.  Muscle body is intact.  Achilles tendon is intact.  Foot and ankle have no swelling or circulatory compromise.  Neurological: He is alert.  Skin: Skin is warm and dry.     UC Treatments / Results  Labs (all labs ordered are listed, but only abnormal results are displayed) Labs Reviewed  GLUCOSE, CAPILLARY - Abnormal; Notable for the following components:      Result Value   Glucose-Capillary 386 (*)    All other components within normal limits    EKG None  Radiology Dg Tibia/fibula Right  Result Date: 10/10/2017 CLINICAL DATA:  Right lower leg pain following a fall this morning. EXAM: RIGHT TIBIA AND FIBULA - 2 VIEW COMPARISON:  None. FINDINGS: Proximal tibial bone island. Ossification of the distal portion of the interosseous membrane. No fracture or dislocation seen. Calcaneal spurs are noted. IMPRESSION: No fracture or dislocation. Electronically Signed   By: Beckie Salts M.D.    On: 10/10/2017 15:52    Procedures Procedures (including critical care time)  Medications Ordered in UC Medications  ibuprofen (ADVIL,MOTRIN) tablet 800 mg (800 mg Oral Given 10/10/17 1542)  Tdap (BOOSTRIX) injection 0.5 mL (0.5 mLs Intramuscular Given 10/10/17 1654)    Initial Impression / Assessment and Plan / UC Course  I have reviewed the triage vital signs  and the nursing notes.  Pertinent labs & imaging results that were available during my care of the patient were reviewed by me and considered in my medical decision making (see chart for details).     Discussed that this is a muscular injury.  He needs to watch for compartment syndrome.  Although less common in the back of the lower leg, it is possible with the degree of trauma.  Strict ice, elevation, range of motion, and limited weightbearing are reviewed with patient.  Reasons for return are reviewed with patient. Final Clinical Impressions(s) / UC Diagnoses   Final diagnoses:  Right calf pain  Contusion of muscle     Discharge Instructions     Stay off of the leg as much as possible for the next couple of days. Ice for 20 minutes every couple of hours for the next 24 to 48 hours Gentle stretching of the ankle and calf at least twice a day Take ibuprofen 800 mg 3 times a day with food for pain Take Flexeril twice a day as needed for muscle relaxer Expect gradual improvement.  Return if worse or if you see reduced circulation to the foot.   ED Prescriptions    Medication Sig Dispense Auth. Provider   ibuprofen (ADVIL,MOTRIN) 800 MG tablet Take 1 tablet (800 mg total) by mouth every 8 (eight) hours as needed for moderate pain. 90 tablet Eustace Moore, MD   cyclobenzaprine (FLEXERIL) 10 MG tablet Take 1 tablet (10 mg total) by mouth 2 (two) times daily as needed for muscle spasms. 20 tablet Eustace Moore, MD     Controlled Substance Prescriptions [place] Controlled Substance Registry consulted? Not  Applicable   Eustace Moore, MD 10/10/17 2022

## 2018-03-24 ENCOUNTER — Emergency Department (HOSPITAL_COMMUNITY)
Admission: EM | Admit: 2018-03-24 | Discharge: 2018-03-24 | Disposition: A | Discharge status: Home / Self Care | Payer: Self-pay | Attending: Emergency Medicine | Admitting: Emergency Medicine

## 2018-03-24 ENCOUNTER — Encounter (HOSPITAL_COMMUNITY): Payer: Self-pay | Admitting: Internal Medicine

## 2018-03-24 DIAGNOSIS — Z79899 Other long term (current) drug therapy: Secondary | ICD-10-CM | POA: Insufficient documentation

## 2018-03-24 DIAGNOSIS — R739 Hyperglycemia, unspecified: Secondary | ICD-10-CM

## 2018-03-24 DIAGNOSIS — H10021 Other mucopurulent conjunctivitis, right eye: Secondary | ICD-10-CM | POA: Insufficient documentation

## 2018-03-24 DIAGNOSIS — I10 Essential (primary) hypertension: Secondary | ICD-10-CM | POA: Insufficient documentation

## 2018-03-24 DIAGNOSIS — Z87891 Personal history of nicotine dependence: Secondary | ICD-10-CM | POA: Insufficient documentation

## 2018-03-24 DIAGNOSIS — Z7984 Long term (current) use of oral hypoglycemic drugs: Secondary | ICD-10-CM | POA: Insufficient documentation

## 2018-03-24 DIAGNOSIS — E1165 Type 2 diabetes mellitus with hyperglycemia: Secondary | ICD-10-CM | POA: Insufficient documentation

## 2018-03-24 LAB — CBC
HCT: 41 % (ref 39.0–52.0)
Hemoglobin: 13 g/dL (ref 13.0–17.0)
MCH: 27.1 pg (ref 26.0–34.0)
MCHC: 31.7 g/dL (ref 30.0–36.0)
MCV: 85.6 fL (ref 80.0–100.0)
NRBC: 0 % (ref 0.0–0.2)
PLATELETS: 228 10*3/uL (ref 150–400)
RBC: 4.79 MIL/uL (ref 4.22–5.81)
RDW: 12.4 % (ref 11.5–15.5)
WBC: 4.7 10*3/uL (ref 4.0–10.5)

## 2018-03-24 LAB — BASIC METABOLIC PANEL
Anion gap: 4 — ABNORMAL LOW (ref 5–15)
BUN: 6 mg/dL (ref 6–20)
CHLORIDE: 101 mmol/L (ref 98–111)
CO2: 29 mmol/L (ref 22–32)
CREATININE: 0.83 mg/dL (ref 0.61–1.24)
Calcium: 8.8 mg/dL — ABNORMAL LOW (ref 8.9–10.3)
GFR calc non Af Amer: >60 mL/min (ref >60)
Glucose, Bld: 272 mg/dL — ABNORMAL HIGH (ref 70–99)
Potassium: 4.5 mmol/L (ref 3.5–5.1)
SODIUM: 134 mmol/L — AB (ref 135–145)

## 2018-03-24 LAB — CBG MONITORING, ED: GLUCOSE-CAPILLARY: 264 mg/dL — AB (ref 70–99)

## 2018-03-24 LAB — HEMOGLOBIN A1C
Hgb A1c MFr Bld: 11.7 % — ABNORMAL HIGH (ref 4.8–5.6)
MEAN PLASMA GLUCOSE: 289.09 mg/dL

## 2018-03-24 MED ORDER — LISINOPRIL 20 MG PO TABS
20.0000 mg | ORAL_TABLET | Freq: Once | ORAL | Status: AC
  Administered 2018-03-24: 20 mg via ORAL
  Filled 2018-03-24: qty 1

## 2018-03-24 MED ORDER — INSULIN ASPART 100 UNIT/ML ~~LOC~~ SOLN
8.0000 [IU] | Freq: Once | SUBCUTANEOUS | Status: AC
  Administered 2018-03-24: 8 [IU] via SUBCUTANEOUS
  Filled 2018-03-24: qty 1

## 2018-03-24 MED ORDER — CARVEDILOL 12.5 MG PO TABS: 12.5000 mg | ORAL_TABLET | Freq: Two times a day (BID) | ORAL | 0 refills | Status: DC

## 2018-03-24 MED ORDER — TETRACAINE HCL 0.5 % OP SOLN
2.0000 [drp] | Freq: Once | OPHTHALMIC | Status: AC
  Administered 2018-03-24: 2 [drp] via OPHTHALMIC
  Filled 2018-03-24: qty 4

## 2018-03-24 MED ORDER — LISINOPRIL 20 MG PO TABS: 20.0000 mg | ORAL_TABLET | Freq: Every day | ORAL | 0 refills | Status: DC

## 2018-03-24 MED ORDER — GLIPIZIDE 10 MG PO TABS: 10.0000 mg | ORAL_TABLET | Freq: Every day | ORAL | 0 refills | Status: DC

## 2018-03-24 MED ORDER — TOBRAMYCIN 0.3 % OP SOLN
2.0000 [drp] | Freq: Once | OPHTHALMIC | Status: AC
  Administered 2018-03-24: 2 [drp] via OPHTHALMIC
  Filled 2018-03-24: qty 5

## 2018-03-24 MED ORDER — SODIUM CHLORIDE 0.9 % IV BOLUS
1000.0000 mL | Freq: Once | INTRAVENOUS | Status: AC
Start: 2018-03-24 — End: 2018-03-24
  Administered 2018-03-24: 1000 mL via INTRAVENOUS

## 2018-03-24 MED ORDER — FLUORESCEIN SODIUM 1 MG OP STRP
1.0000 | ORAL_STRIP | Freq: Once | OPHTHALMIC | Status: AC
Start: 2018-03-24 — End: 2018-03-24
  Administered 2018-03-24: 1 via OPHTHALMIC
  Filled 2018-03-24: qty 1

## 2018-03-24 NOTE — ED Notes (Signed)
CBG was 264. Notified Marcelino DusterMichelle, Charity fundraiserN.

## 2018-03-24 NOTE — ED Notes (Signed)
Patient verbalizes understanding of discharge instructions. Opportunity for questioning and answers were provided. Armband removed by staff, pt discharged from ED.  

## 2018-03-24 NOTE — ED Notes (Signed)
ED Provider at bedside. 

## 2018-03-24 NOTE — Discharge Instructions (Addendum)
It was our pleasure to provide your ER care today - we hope that you feel better.  Follow diabetic eating plan.  Drink plenty of fluids/water.   Take glipizide as prescribed. Check blood sugar 4x/day and record values.   Follow up with primary care doctor in the next 2-3 days.  Bring copy of your blood sugars there - also follow up with there this week for recheck of blood pressure as it is high today.  As relates your blood pressure - limit salt intake, and take blood pressure medication as prescribed.   For eye, avoid rubbing eye. Use tobrex eye drops, 1-2 drops to right eye 4x/day for the next 5 days.   Return to ER if worse, new symptoms, persistent vomiting, new or severe pain, weak/fainting, other concern.

## 2018-03-24 NOTE — ED Provider Notes (Signed)
[facility] EMERGENCY DEPARTMENT Provider Note   CSN: [id number] Arrival date & time: 03/24/18  0847     History   Chief Complaint Chief Complaint  Patient presents with  . Hyperglycemia  . Hypertension    HPI Randy [name] is a 37 y.o. male.  Patient c/o high blood pressure and high blood sugar - states history of same, and out of meds for past 2 weeks. States he wants hemoglobin A1c checked as well. Pt also indicates yesterday at work, had a small branch of tree brush against face, and today has redness, and matting of lashes of right eye. No severe eye pain or change in vision. No persistent fb sensation. States hasnt checked sugar at home, ems indicates was in 300s, gave 500 cc ns bolus. No polyuria, no nv. Denies headache. No chest pain or discomfort. No sob. No swelling.   The history is provided by the patient.  Hyperglycemia  Associated symptoms: no abdominal pain, no chest pain, no confusion, no fever, no polyuria, no shortness of breath, no vomiting and no weakness   Hypertension  Pertinent negatives include no chest pain, no abdominal pain, no headaches and no shortness of breath.    Past Medical History:  Diagnosis Date  . Hypertension   . Pancreatitis   . Type II diabetes mellitus [facility])     Patient Active Problem List   Diagnosis Date Noted  . Intractable nausea and vomiting 08/31/2015  . Hypertensive urgency 08/31/2015  . Uncontrolled diabetes mellitus (HCC) 08/31/2015  . Marijuana abuse 08/31/2015  . Noncompliance with medication regimen 08/31/2015    Past Surgical History:  Procedure Laterality Date  . NO PAST SURGERIES          Home Medications    Prior to Admission medications   Medication Sig Start Date End Date Taking? Authorizing Provider  carvedilol (COREG) 12.5 MG tablet Take 12.5 mg by mouth 2 (two) times daily with a meal.    [provider]  cyclobenzaprine (FLEXERIL) 10 MG tablet Take 1 tablet (10 mg  total) by mouth 2 (two) times daily as needed for muscle spasms. 10/10/17   Eustace Moore, MD  dicyclomine (BENTYL) 20 MG tablet Take 1 tablet (20 mg total) by mouth 2 (two) times daily as needed for spasms (abdominal pain). 02/11/17   Alvira Monday, MD  glipiZIDE (GLUCOTROL) 10 MG tablet Take 10 mg by mouth daily before breakfast.    [provider]  ibuprofen (ADVIL,MOTRIN) 800 MG tablet Take 1 tablet (800 mg total) by mouth every 8 (eight) hours as needed for moderate pain. 10/10/17   Eustace Moore, MD  lisinopril (PRINIVIL,ZESTRIL) 20 MG tablet Take 20 mg by mouth daily.    [provider]  ondansetron (ZOFRAN ODT) 4 MG disintegrating tablet Take 1 tablet (4 mg total) by mouth every 8 (eight) hours as needed for nausea or vomiting. 02/11/17   Alvira Monday, MD  ondansetron (ZOFRAN) 4 MG tablet Take 1 tablet (4 mg total) by mouth every 6 (six) hours. 09/28/17   Terrilee Files, MD    Family History Family History  Problem Relation Age of Onset  . Hypertension Mother   . Diabetes Father   . Hypertension Father     Social History Social History   Tobacco Use  . Smoking status: Former Smoker    Packs/day: 0.33    Years: 17.00    Pack years: 5.61    Types: Cigarettes    Last attempt to  quit: 12/26/2011    Years since quitting: 6.2  . Smokeless tobacco: Never Used  Substance Use Topics  . Alcohol use: No  . Drug use: Yes    Types: Marijuana    Comment: 08/31/2015 "frequent times/day"     Allergies   Patient has no known allergies.   Review of Systems Review of Systems  Constitutional: Negative for fever.  HENT: Negative for sore throat.   Eyes: Positive for redness. Negative for visual disturbance.  Respiratory: Negative for cough and shortness of breath.   Cardiovascular: Negative for chest pain and leg swelling.  Gastrointestinal: Negative for abdominal pain and vomiting.  Endocrine: Negative for polyuria.  Genitourinary: Negative for  flank pain.  Musculoskeletal: Negative for back pain and neck pain.  Skin: Negative for rash.  Neurological: Negative for speech difficulty, weakness, numbness and headaches.  Hematological: Does not bruise/bleed easily.  Psychiatric/Behavioral: Negative for confusion.     Physical Exam Updated Vital Signs BP (!) 182/106 (BP Location: Right Arm)   Pulse 68   Resp 12   Ht 1.727 m (5\' 8" )   Wt 81.6 kg   SpO2 100%   BMI 27.37 kg/m   Physical Exam  Constitutional: He appears well-developed and well-nourished.  HENT:  Mouth/Throat: Oropharynx is clear and moist.  Eyes: Pupils are equal, round, and reactive to light. EOM are normal. Right eye exhibits discharge.  Right conjunctiva injected. Lids everted, no fb seen. Pupils 3-4 mm, briskly reactive bil. No corneal clouding. No hyphema.   Neck: Neck supple. No tracheal deviation present.  Cardiovascular: Normal rate, regular rhythm, normal heart sounds and intact distal pulses. Exam reveals no gallop and no friction rub.  No murmur heard. Pulmonary/Chest: Effort normal and breath sounds normal. No accessory muscle usage. No respiratory distress.  Abdominal: Soft. Bowel sounds are normal. He exhibits no distension. There is no tenderness.  Musculoskeletal: He exhibits no edema.  Neurological: He is alert.  Speech clear/fluent. Ambulates w steady gait.   Skin: Skin is warm and dry. No rash noted.  Psychiatric: He has a normal mood and affect.  Nursing note and vitals reviewed.    ED Treatments / Results  Labs (all labs ordered are listed, but only abnormal results are displayed) Results for orders placed or performed during the hospital encounter of 03/24/18  Basic metabolic panel  Result Value Ref Range   Sodium 134 (L) 135 - 145 mmol/L   Potassium 4.5 3.5 - 5.1 mmol/L   Chloride 101 98 - 111 mmol/L   CO2 29 22 - 32 mmol/L   Glucose, Bld 272 (H) 70 - 99 mg/dL   BUN 6 6 - 20 mg/dL   Creatinine, Ser 1.61 0.61 - 1.24 mg/dL    Calcium 8.8 (L) 8.9 - 10.3 mg/dL   GFR calc non Af Amer >60 >60 mL/min   GFR calc Af Amer >60 >60 mL/min   Anion gap 4 (L) 5 - 15  CBC  Result Value Ref Range   WBC 4.7 4.0 - 10.5 K/uL   RBC 4.79 4.22 - 5.81 MIL/uL   Hemoglobin 13.0 13.0 - 17.0 g/dL   HCT 09.6 04.5 - 40.9 %   MCV 85.6 80.0 - 100.0 fL   MCH 27.1 26.0 - 34.0 pg   MCHC 31.7 30.0 - 36.0 g/dL   RDW 81.1 91.4 - 78.2 %   Platelets 228 150 - 400 K/uL   nRBC 0.0 0.0 - 0.2 %  Hemoglobin A1c  Result Value Ref Range  Hgb A1c MFr Bld 11.7 (H) 4.8 - 5.6 %   Mean Plasma Glucose 289.09 mg/dL  CBG monitoring, ED  Result Value Ref Range   Glucose-Capillary 264 (H) 70 - 99 mg/dL   Comment 1 Notify RN    Comment 2 Document in Chart    EKG None  Radiology No results found.  Procedures Procedures (including critical care time)  Medications Ordered in ED Medications  fluorescein ophthalmic strip 1 strip (has no administration in time range)  tetracaine (PONTOCAINE) 0.5 % ophthalmic solution 2 drop (has no administration in time range)     Initial Impression / Assessment and Plan / ED Course  I have reviewed the triage vital signs and the nursing notes.  Pertinent labs & imaging results that were available during my care of the patient were reviewed by me and considered in my medical decision making (see chart for details).  Lab sent.   Iv ns bolus.   Pt given a dose of his bp med, lisinopril 20 mg po. rx for home.   Will give rx for his glipizide, lisinopril, and rec pcp f/u.   Reviewed nursing notes and prior charts for additional history.   Tetracaine/fluorescein applied to right eye. No corneal abrasion noted.   tobrex eye drops for conjunctivitis.   Labs reviewed - glucose elev. hco3 normal. novolog sq.   bp improved from prior.   Pt currently appears stable for d/c.     Final Clinical Impressions(s) / ED Diagnoses   Final diagnoses:  None    ED Discharge Orders    None       Cathren Laine, MD 03/24/18 1106

## 2018-03-24 NOTE — ED Triage Notes (Addendum)
Pt here with multiple complaints. He has been out of his BP meds for approx 1 week and here today with BP 172/90. Hyperglycemic at 367 and cannot afford his home insulin at this time. He was also struck by a tree branch to his right eye yesterday while he was at work. Pt has vision equally in both eyes at this time. Given NS bolus by EMS for hyperglycemia. CBG 264 for this RN.

## 2018-05-01 ENCOUNTER — Emergency Department (HOSPITAL_COMMUNITY): Payer: Self-pay

## 2018-05-01 ENCOUNTER — Encounter (HOSPITAL_COMMUNITY): Payer: Self-pay | Admitting: Emergency Medicine

## 2018-05-01 ENCOUNTER — Emergency Department (HOSPITAL_COMMUNITY)
Admission: EM | Admit: 2018-05-01 | Discharge: 2018-05-01 | Disposition: A | Discharge status: Home / Self Care | Payer: Self-pay | Attending: Emergency Medicine | Admitting: Emergency Medicine

## 2018-05-01 ENCOUNTER — Other Ambulatory Visit: Payer: Self-pay

## 2018-05-01 DIAGNOSIS — M546 Pain in thoracic spine: Secondary | ICD-10-CM | POA: Insufficient documentation

## 2018-05-01 DIAGNOSIS — Z87891 Personal history of nicotine dependence: Secondary | ICD-10-CM | POA: Insufficient documentation

## 2018-05-01 DIAGNOSIS — R739 Hyperglycemia, unspecified: Secondary | ICD-10-CM

## 2018-05-01 DIAGNOSIS — E1165 Type 2 diabetes mellitus with hyperglycemia: Secondary | ICD-10-CM | POA: Insufficient documentation

## 2018-05-01 DIAGNOSIS — R079 Chest pain, unspecified: Secondary | ICD-10-CM | POA: Insufficient documentation

## 2018-05-01 DIAGNOSIS — Z79899 Other long term (current) drug therapy: Secondary | ICD-10-CM | POA: Insufficient documentation

## 2018-05-01 DIAGNOSIS — I10 Essential (primary) hypertension: Secondary | ICD-10-CM | POA: Insufficient documentation

## 2018-05-01 LAB — CBC WITH DIFFERENTIAL/PLATELET
Abs Immature Granulocytes: 0.01 10*3/uL (ref 0.00–0.07)
BASOS PCT: 1 %
Basophils Absolute: 0.1 10*3/uL (ref 0.0–0.1)
Eosinophils Absolute: 0.1 10*3/uL (ref 0.0–0.5)
Eosinophils Relative: 2 %
HCT: 44.8 % (ref 39.0–52.0)
Hemoglobin: 14 g/dL (ref 13.0–17.0)
IMMATURE GRANULOCYTES: 0 %
Lymphocytes Relative: 42 %
Lymphs Abs: 2.6 10*3/uL (ref 0.7–4.0)
MCH: 26.7 pg (ref 26.0–34.0)
MCHC: 31.3 g/dL (ref 30.0–36.0)
MCV: 85.5 fL (ref 80.0–100.0)
Monocytes Absolute: 0.5 10*3/uL (ref 0.1–1.0)
Monocytes Relative: 7 %
NEUTROS ABS: 3 10*3/uL (ref 1.7–7.7)
Neutrophils Relative %: 48 %
PLATELETS: 263 10*3/uL (ref 150–400)
RBC: 5.24 MIL/uL (ref 4.22–5.81)
RDW: 12.4 % (ref 11.5–15.5)
WBC: 6.2 10*3/uL (ref 4.0–10.5)
nRBC: 0 % (ref 0.0–0.2)

## 2018-05-01 LAB — I-STAT VENOUS BLOOD GAS, ED
Acid-Base Excess: 2 mmol/L (ref 0.0–2.0)
Bicarbonate: 31.7 mmol/L — ABNORMAL HIGH (ref 20.0–28.0)
O2 Saturation: 16 %
TCO2: 34 mmol/L — ABNORMAL HIGH (ref 22–32)
pCO2, Ven: 70.2 mmHg (ref 44.0–60.0)
pH, Ven: 7.264 (ref 7.250–7.430)
pO2, Ven: 16 mmHg — CL (ref 32.0–45.0)

## 2018-05-01 LAB — COMPREHENSIVE METABOLIC PANEL
ALBUMIN: 3.9 g/dL (ref 3.5–5.0)
ALT: 21 U/L (ref 0–44)
ANION GAP: 12 (ref 5–15)
AST: 25 U/L (ref 15–41)
Alkaline Phosphatase: 91 U/L (ref 38–126)
BUN: 8 mg/dL (ref 6–20)
CO2: 27 mmol/L (ref 22–32)
Calcium: 9.1 mg/dL (ref 8.9–10.3)
Chloride: 96 mmol/L — ABNORMAL LOW (ref 98–111)
Creatinine, Ser: 0.89 mg/dL (ref 0.61–1.24)
GFR calc Af Amer: >60 mL/min (ref >60)
GFR calc non Af Amer: >60 mL/min (ref >60)
Glucose, Bld: 298 mg/dL — ABNORMAL HIGH (ref 70–99)
POTASSIUM: 4.3 mmol/L (ref 3.5–5.1)
Sodium: 135 mmol/L (ref 135–145)
Total Bilirubin: 1.4 mg/dL — ABNORMAL HIGH (ref 0.3–1.2)
Total Protein: 8.1 g/dL (ref 6.5–8.1)

## 2018-05-01 LAB — I-STAT TROPONIN, ED
Troponin i, poc: 0.01 ng/mL (ref 0.00–0.08)
Troponin i, poc: 0.01 ng/mL (ref 0.00–0.08)

## 2018-05-01 LAB — URINALYSIS, ROUTINE W REFLEX MICROSCOPIC
Bilirubin Urine: NEGATIVE
Glucose, UA: >=500 mg/dL — AB
Ketones, ur: 20 mg/dL — AB
Leukocytes, UA: NEGATIVE
Nitrite: NEGATIVE
Protein, ur: 100 mg/dL — AB
Specific Gravity, Urine: 1.029 (ref 1.005–1.030)
pH: 7 (ref 5.0–8.0)

## 2018-05-01 LAB — CBG MONITORING, ED: Glucose-Capillary: 293 mg/dL — ABNORMAL HIGH (ref 70–99)

## 2018-05-01 LAB — LIPASE, BLOOD: Lipase: 73 U/L — ABNORMAL HIGH (ref 11–51)

## 2018-05-01 MED ORDER — METHOCARBAMOL 500 MG PO TABS: 500.0000 mg | ORAL_TABLET | Freq: Two times a day (BID) | ORAL | 0 refills | Status: DC

## 2018-05-01 MED ORDER — CARVEDILOL 12.5 MG PO TABS: 12.5000 mg | ORAL_TABLET | Freq: Two times a day (BID) | ORAL | 0 refills | Status: DC

## 2018-05-01 MED ORDER — IOPAMIDOL (ISOVUE-370) INJECTION 76%
INTRAVENOUS | Status: AC
  Filled 2018-05-01: qty 100

## 2018-05-01 MED ORDER — MORPHINE SULFATE (PF) 4 MG/ML IV SOLN
4.0000 mg | Freq: Once | INTRAVENOUS | Status: AC
  Administered 2018-05-01: 4 mg via INTRAVENOUS
  Filled 2018-05-01: qty 1

## 2018-05-01 MED ORDER — IOPAMIDOL (ISOVUE-370) INJECTION 76%
100.0000 mL | Freq: Once | INTRAVENOUS | Status: AC | PRN
  Administered 2018-05-01: 100 mL via INTRAVENOUS

## 2018-05-01 MED ORDER — SODIUM CHLORIDE 0.9 % IV BOLUS
1000.0000 mL | Freq: Once | INTRAVENOUS | Status: AC
  Administered 2018-05-01: 1000 mL via INTRAVENOUS

## 2018-05-01 MED ORDER — HYDRALAZINE HCL 20 MG/ML IJ SOLN
10.0000 mg | Freq: Once | INTRAMUSCULAR | Status: AC
  Administered 2018-05-01: 10 mg via INTRAVENOUS
  Filled 2018-05-01: qty 1

## 2018-05-01 MED ORDER — CARVEDILOL 12.5 MG PO TABS
12.5000 mg | ORAL_TABLET | Freq: Once | ORAL | Status: AC
  Administered 2018-05-01: 12.5 mg via ORAL
  Filled 2018-05-01: qty 1

## 2018-05-01 MED ORDER — LISINOPRIL 20 MG PO TABS: 20.0000 mg | ORAL_TABLET | Freq: Every day | ORAL | 0 refills | Status: DC

## 2018-05-01 MED ORDER — GLIPIZIDE 10 MG PO TABS: 10.0000 mg | ORAL_TABLET | Freq: Every day | ORAL | 0 refills | Status: DC

## 2018-05-01 MED ORDER — LISINOPRIL 20 MG PO TABS
20.0000 mg | ORAL_TABLET | Freq: Once | ORAL | Status: AC
  Administered 2018-05-01: 20 mg via ORAL
  Filled 2018-05-01: qty 1

## 2018-05-01 MED ORDER — FREESTYLE LANCETS MISC: 12 refills | Status: DC

## 2018-05-01 MED ORDER — HYDRALAZINE HCL 20 MG/ML IJ SOLN: 10.0000 mg | Freq: Once | INTRAMUSCULAR | Status: DC

## 2018-05-01 MED ORDER — ONDANSETRON HCL 4 MG/2ML IJ SOLN
4.0000 mg | Freq: Once | INTRAMUSCULAR | Status: AC
  Administered 2018-05-01: 4 mg via INTRAVENOUS
  Filled 2018-05-01: qty 2

## 2018-05-01 MED ORDER — GLUCOSE BLOOD VI STRP: ORAL_STRIP | 12 refills | Status: DC

## 2018-05-01 NOTE — ED Notes (Addendum)
ED Provider at bedside. 

## 2018-05-01 NOTE — ED Provider Notes (Signed)
[facility]   CSN: [id number] Arrival date & time: 05/01/18  0831     History   Chief Complaint Chief Complaint  Patient presents with  . Back Pain  . Nausea    HPI Randy [name] is a 37 y.o. male.  HPI  Patient is a 37 year old male with a history of hypertension and type 2 diabetes mellitus, out of his medications for 1 month presenting for mid to low back pain, epigastric pain, nausea, and vomiting.  Patient is stating that he thinks he has pancreatitis.  He states that this episode is not as severe as his previous episode of pancreatitis.  Patient reports that at that time it was induced by alcohol use, and reports he has not drank in 1 year.  Patient reports that symptoms began 3 days ago.  He describes the pain in his back as aching.  Denies injury.  Reports that it feels better with lying supine, and taking a hot shower.  Patient denies any lower extremity weakness or numbness, saddle anesthesia, loss of bowel bladder control or urinary retention.  Denies IVDU or cancer history.  Patient reports over the past 3 days he has vomited approximately twice nonbilious and nonbloody.  Describes the epigastric abdominal pain is crampy.  Denies fever or chills.  Denies chest pain, shortness of breath.  Patient denies diarrhea or dysuria.  Reports urgency and frequency.  Patient reports that he does not have a PCP at present.  Patient reports he has no way to obtain his medications.  Patient reports daily marijuana use but denies any cocaine, methamphetamine, or other illicit drug use.  No abdominal surgical history.  Past Medical History:  Diagnosis Date  . Hypertension   . Pancreatitis   . Type II diabetes mellitus [facility](HCC)     Patient Active Problem List   Diagnosis Date Noted  . Intractable nausea and vomiting 08/31/2015  . Hypertensive urgency 08/31/2015  . Uncontrolled diabetes mellitus (HCC) 08/31/2015  . Marijuana abuse  08/31/2015  . Noncompliance with medication regimen 08/31/2015    Past Surgical History:  Procedure Laterality Date  . NO PAST SURGERIES          Home Medications    Prior to Admission medications   Medication Sig Start Date End Date Taking? Authorizing Provider  carvedilol (COREG) 12.5 MG tablet Take 12.5 mg by mouth 2 (two) times daily with a meal.    [provider]  carvedilol (COREG) 12.5 MG tablet Take 1 tablet (12.5 mg total) by mouth 2 (two) times daily with a meal. 03/24/18   Cathren LaineSteinl, Kevin, MD  cyclobenzaprine (FLEXERIL) 10 MG tablet Take 1 tablet (10 mg total) by mouth 2 (two) times daily as needed for muscle spasms. Patient not taking: Reported on 03/24/2018 10/10/17   Eustace MooreNelson, Yvonne Sue, MD  dicyclomine (BENTYL) 20 MG tablet Take 1 tablet (20 mg total) by mouth 2 (two) times daily as needed for spasms (abdominal pain). Patient not taking: Reported on 03/24/2018 02/11/17   Alvira MondaySchlossman, Erin, MD  glipiZIDE (GLUCOTROL) 10 MG tablet Take 10 mg by mouth daily before breakfast.    [provider]  glipiZIDE (GLUCOTROL) 10 MG tablet Take 1 tablet (10 mg total) by mouth daily before breakfast. 03/24/18   Cathren LaineSteinl, Kevin, MD  ibuprofen (ADVIL,MOTRIN) 800 MG tablet Take 1 tablet (800 mg total) by mouth every 8 (eight) hours as needed for moderate pain. Patient not taking: Reported on 03/24/2018 10/10/17   Rica MastNelson, Yvonne  Fannie Knee, MD  lisinopril (PRINIVIL,ZESTRIL) 20 MG tablet Take 20 mg by mouth daily.    [provider]  lisinopril (PRINIVIL,ZESTRIL) 20 MG tablet Take 1 tablet (20 mg total) by mouth daily. 03/24/18   Cathren Laine, MD  ondansetron (ZOFRAN ODT) 4 MG disintegrating tablet Take 1 tablet (4 mg total) by mouth every 8 (eight) hours as needed for nausea or vomiting. Patient not taking: Reported on 03/24/2018 02/11/17   Alvira Monday, MD  ondansetron (ZOFRAN) 4 MG tablet Take 1 tablet (4 mg total) by mouth every 6 (six) hours. Patient not taking: Reported  on 03/24/2018 09/28/17   Terrilee Files, MD    Family History Family History  Problem Relation Age of Onset  . Hypertension Mother   . Diabetes Father   . Hypertension Father     Social History Social History   Tobacco Use  . Smoking status: Former Smoker    Packs/day: 0.33    Years: 17.00    Pack years: 5.61    Types: Cigarettes    Last attempt to quit: 12/26/2011    Years since quitting: 6.3  . Smokeless tobacco: Never Used  Substance Use Topics  . Alcohol use: No  . Drug use: Yes    Types: Marijuana    Comment: 08/31/2015 "frequent times/day"     Allergies   Patient has no known allergies.   Review of Systems Review of Systems  Constitutional: Negative for chills and fever.  HENT: Negative for congestion and sore throat.   Eyes: Negative for visual disturbance.  Respiratory: Negative for cough, chest tightness and shortness of breath.   Cardiovascular: Negative for chest pain, palpitations and leg swelling.  Gastrointestinal: Positive for nausea and vomiting. Negative for abdominal pain and diarrhea.  Genitourinary: Positive for frequency and urgency. Negative for dysuria and flank pain.  Musculoskeletal: Positive for back pain. Negative for myalgias.  Skin: Negative for rash.  Neurological: Negative for dizziness, syncope, weakness, light-headedness, numbness and headaches.     Physical Exam Updated Vital Signs BP (!) 196/109   Pulse 60   Temp 97.7 F (36.5 C) (Oral)   Resp 16   SpO2 100%   Physical Exam Vitals signs and nursing note reviewed.  Constitutional:      General: He is not in acute distress.    Appearance: He is well-developed.  HENT:     Head: Normocephalic and atraumatic.  Eyes:     Conjunctiva/sclera: Conjunctivae normal.     Pupils: Pupils are equal, round, and reactive to light.  Neck:     Musculoskeletal: Normal range of motion and neck supple.  Cardiovascular:     Rate and Rhythm: Normal rate and regular rhythm.     Heart  sounds: S1 normal and S2 normal. No murmur.  Pulmonary:     Effort: Pulmonary effort is normal.     Breath sounds: Normal breath sounds. No wheezing or rales.  Abdominal:     General: There is no distension.     Palpations: Abdomen is soft.     Tenderness: There is abdominal tenderness. There is no guarding.     Comments: Mild epigastric TTP w/o guarding or rebound.   Musculoskeletal: Normal range of motion.        General: No deformity.     Comments: Spine Exam: Inspection/Palpation: No midline tenderness of cervical, thoracic, or lumbar spine.  Patient has paraspinal muscular tenderness to palpation of the lower thoracic and upper lumbar region. Strength: 5/5 throughout LE bilaterally (hip  flexion/extension, adduction/abduction; knee flexion/extension; foot dorsiflexion/plantarflexion, inversion/eversion; great toe inversion) Sensation: Intact to light touch in proximal and distal LE bilaterally Reflexes: 2+ quadriceps and achilles reflexes Normal and symmetric gait.  Lymphadenopathy:     Cervical: No cervical adenopathy.  Skin:    General: Skin is warm and dry.     Findings: No erythema or rash.  Neurological:     Mental Status: He is alert.     Comments: Cranial nerves grossly intact. Patient moves extremities symmetrically and with good coordination.  Psychiatric:        Behavior: Behavior normal.        Thought Content: Thought content normal.        Judgment: Judgment normal.      ED Treatments / Results  Labs (all labs ordered are listed, but only abnormal results are displayed) Labs Reviewed  COMPREHENSIVE METABOLIC PANEL - Abnormal; Notable for the following components:      Result Value   Chloride 96 (*)    Glucose, Bld 298 (*)    Total Bilirubin 1.4 (*)    All other components within normal limits  LIPASE, BLOOD - Abnormal; Notable for the following components:   Lipase 73 (*)    All other components within normal limits  URINALYSIS, ROUTINE W REFLEX  MICROSCOPIC - Abnormal; Notable for the following components:   Glucose, UA >=500 (*)    Hgb urine dipstick SMALL (*)    Ketones, ur 20 (*)    Protein, ur 100 (*)    Bacteria, UA RARE (*)    All other components within normal limits  CBG MONITORING, ED - Abnormal; Notable for the following components:   Glucose-Capillary 293 (*)    All other components within normal limits  I-STAT VENOUS BLOOD GAS, ED - Abnormal; Notable for the following components:   pCO2, Ven 70.2 (*)    pO2, Ven 16.0 (*)    Bicarbonate 31.7 (*)    TCO2 34 (*)    All other components within normal limits  CBC WITH DIFFERENTIAL/PLATELET  I-STAT TROPONIN, ED  I-STAT TROPONIN, ED    EKG EKG Interpretation  Date/Time:  Friday May 01 2018 09:01:20 EST Ventricular Rate:  58 PR Interval:    QRS Duration: 79 QT Interval:  407 QTC Calculation: 400 R Axis:   83 Text Interpretation:  Sinus rhythm Consider left ventricular hypertrophy ST elevation suggests acute pericarditis improved baseline, grossly unchanged since previous  Confirmed by Richardean CanalYao, David H [phone]([id number]) on 05/01/2018 9:09:47 AM   Radiology Dg Chest 2 View  Result Date: 05/01/2018 CLINICAL DATA:  Shortness of breath and epigastric pain EXAM: CHEST - 2 VIEW COMPARISON:  February 10, 2017 FINDINGS: The lungs are clear. The heart size and pulmonary vascularity are normal. No adenopathy. No bone lesions. IMPRESSION: No edema or consolidation. Electronically Signed   By: Bretta BangWilliam  Woodruff III M.D.   On: 05/01/2018 09:54   Ct Angio Chest/abd/pel For Dissection W And/or W/wo  Result Date: 05/01/2018 CLINICAL DATA:  Chest and abdominal pain with nausea and vomiting. Assess for vascular dissection. EXAM: CT ANGIOGRAPHY CHEST, ABDOMEN AND PELVIS TECHNIQUE: Multidetector CT imaging through the chest, abdomen and pelvis was performed using the standard protocol during bolus administration of intravenous contrast. Multiplanar reconstructed images and MIPs were obtained  and reviewed to evaluate the vascular anatomy. CONTRAST:  100mL ISOVUE-370 IOPAMIDOL (ISOVUE-370) INJECTION 76% COMPARISON:  None. FINDINGS: CTA CHEST FINDINGS Cardiovascular: Heart size is normal. No coronary artery calcification. No aortic calcification. No aneurysm or  dissection. Pulmonary arteries appear normal. Mediastinum/Nodes: No mass or lymphadenopathy. Lungs/Pleura: The lungs are clear. No pleural fluid. No pneumothorax. Musculoskeletal: No chest wall or spinal abnormality seen. Review of the MIP images confirms the above findings. CTA ABDOMEN AND PELVIS FINDINGS VASCULAR Aorta: Normal. No atherosclerotic change. No aneurysm or dissection. Celiac: Normal SMA: Normal Renals: Normal IMA: Normal Inflow: Normal Veins: Normal Review of the MIP images confirms the above findings. NON-VASCULAR Hepatobiliary: Normal Pancreas: Normal Spleen: Normal Adrenals/Urinary Tract: Normal Stomach/Bowel: Normal. Appendix is normal. Lymphatic: Normal Reproductive: Normal Other: No free fluid or air. Musculoskeletal: Normal Review of the MIP images confirms the above findings. IMPRESSION: Normal examination. No evidence vascular pathology. No evidence of organ pathology. No abnormality seen to explain the presenting symptoms. Electronically Signed   By: Paulina Fusi M.D.   On: 05/01/2018 15:17    Procedures Procedures (including critical care time)  Medications Ordered in ED Medications  sodium chloride 0.9 % bolus 1,000 mL (1,000 mLs Intravenous New Bag/Given 05/01/18 0900)  carvedilol (COREG) tablet 12.5 mg (has no administration in time range)     Initial Impression / Assessment and Plan / ED Course  I have reviewed the triage vital signs and the nursing notes.  Pertinent labs & imaging results that were available during my care of the patient were reviewed by me and considered in my medical decision making (see chart for details).  Clinical Course as of May 02 1555  Fri May 01, 2018  0915 Unclear  etiology. Patient is a smoker. No history of chronic lung disease.  pCO2, Ven(!!): 70.2 [AM]  1036 Not elevated to 3x upper limit of normal.   Lipase(!): 73 [AM]  1151 Patient verbally verified a safe ride from the ED. Proceeded with prescribing morphine for pain/relaxtion/muscle relaxation in the ED.   [AM]  1156 Improving with home BP meds.   BP(!): 173/108 [AM]  1237 BP now 172/103. Patient pain free. Will proceed with CTPA.   [AM]  1315 BP 151/94. Stable and resting.    [AM]    Clinical Course User Index [AM] Elisha Ponder, PA-C    Patient is nontoxic appearing, afebrile, and hemodynamically stable.  He does have significant hypertension with systolic readings in the 190s and diastolic readings 109-115.  Differential diagnosis includes DKA, diabetic gastroparesis, pancreatitis.  Do not suspect cauda equina as patient has no signs or symptoms of this on exam or in history.  Suspect that patient's back pain is musculoskeletal in nature.  Patient has normal lung exam and do not suspect acute cardiopulmonary process at this time.  Patient has no risk factors for PE and is not having unilateral flank pain. Patient's hypertension is largely asymptomatic, and will lower by restarting oral medications.  Work-up reassuring.  Blood pressure is reducing with the antihypertensive interventions in the emergency department.  No signs of endorgan dysfunction to suggest hypertensive emergency today.  Lipase slightly elevated, but not elevated to 3 times upper limit of normal.  May be due to fasting period.  Patient has not had any p.o. nutrition today.  Patient's dissection study is negative for any vascular abnormality in the chest, abdomen, and pelvis.  Pancreas is normal on the scan as well.  Patient was seen by social work and case management, patient given Insurance account manager.  Patient was restarted on his blood pressure and glipizide.  Patient has previously been on insulin, however discussed  with the patient that since his been a long time since he was  on this medication, I would not restart without the close follow-up with her primary care provider.  I did recommend patient that he test his blood glucose daily.   Return precautions discussed with the patient including intractable nausea or vomiting, chest pain or shortness of breath, worsening abdominal pain, or any new or worsening symptoms.  Patient is in understanding and agrees with the plan of care.  This is a supervised visit with Dr. Chaney Malling. Evaluation, management, and discharge planning discussed with this attending physician.  Final Clinical Impressions(s) / ED Diagnoses   Final diagnoses:  Acute bilateral thoracic back pain  Hyperglycemia    ED Discharge Orders         Ordered    glipiZIDE (GLUCOTROL) 10 MG tablet  Daily before breakfast     05/01/18 1727    carvedilol (COREG) 12.5 MG tablet  2 times daily with meals     05/01/18 1727    lisinopril (PRINIVIL,ZESTRIL) 20 MG tablet  Daily     05/01/18 1727    methocarbamol (ROBAXIN) 500 MG tablet  2 times daily     05/01/18 1727    glucose blood (FREESTYLE TEST STRIPS) test strip     05/01/18 1728    Lancets (FREESTYLE) lancets     05/01/18 1728           Elisha Ponder, PA-C 05/01/18 1737    Charlynne Pander, MD 05/05/18 [phone]

## 2018-05-01 NOTE — ED Notes (Addendum)
Pt requesting to eat.  This RN explained to patient he needs to wait till CT Renal Stone study is complete and nausea has subsided.

## 2018-05-01 NOTE — ED Notes (Signed)
Pt unable to sign due to non working signature pad.  Pt stable, ambulatory, and verbalizes understanding of d/c instructions.

## 2018-05-01 NOTE — ED Notes (Signed)
Patient transported to CT 

## 2018-05-01 NOTE — ED Notes (Signed)
Gave pt water, tolerating well at this time.  

## 2018-05-01 NOTE — Progress Notes (Addendum)
10:37am-CSW [facility] and was asked to provide pt with MATCH Letter. CSW has provided PA Alyssa with MATCH letter for pt at this time. No further CSW needs warranted CSW will sign off.    [facility] needs around medication and other services. RNCM looking into assisting pt at this time.     Claude MangesKierra S. Derreck Wiltsey, MSW, LCSW-A Emergency Department Clinical Social Worker [phone]

## 2018-05-01 NOTE — Care Management Note (Signed)
Case Management Note  CM consulted for no pcp and no ins with need for follow up and medication assistance.  Pt was MATCHed.  MATCH letter provided to Wall LaneMurray, [place].  [facility] placed on AVS with information for financial counseling and pharmacy.  Elnoria HowardUpdated Murray, PA.  No further CM needs noted at this time.  Annah Jasko, Lynnae SandhoffAngela N, RN 05/01/2018, 9:57 AM

## 2018-05-01 NOTE — ED Notes (Signed)
Pt stated back pain and ask for pain medicine

## 2018-05-01 NOTE — Discharge Instructions (Addendum)
Please see the information and instructions below regarding your visit.  Your diagnoses today include:  1. Acute bilateral thoracic back pain   2. Hyperglycemia     Tests performed today include: See side panel of your discharge paperwork for testing performed today. Vital signs are listed at the bottom of these instructions.   Your work-up is reassuring today.  There are no signs of kidney stones or problems with the vessels of your chest and abdomen to suggest your back pain.  Medications prescribed:    Take any prescribed medications only as prescribed, and any over the counter medications only as directed on the packaging.  Please restart your carvedilol, glipizide, and lisinopril.  Glipizide can lower your blood sugar.  If you experience any of the signs in the inserted packet about hypoglycemia such as dizziness, lightheadedness, nausea, sweating, please check your blood sugar immediately.  Please follow this up with a protein snack and a glass of orange juice.  Check blood sugar 15 and 30 minutes after correcting.  You are prescribed Robaxin, a muscle relaxant. Some common side effects of this medication include:  Feeling sleepy.  Dizziness. Take care upon going from a seated to a standing position.  Dry mouth.  Feeling tired or weak.  Hard stools (constipation).  Upset stomach. These are not all of the side effects that may occur. If you have questions about side effects, call your doctor. Call your primary care provider for medical advice about side effects.  This medication can be sedating. Only take this medication as needed. Please do not combine with alcohol. Do not drive or operate machinery while taking this medication.   This medication can interact with some other medications. Make sure to tell any provider you are taking this medication before they prescribe you a new medication.   Home care instructions:  Please follow any educational materials contained in this  packet.   Follow-up instructions: Please follow-up with your primary care provider as soon as possible for further evaluation of your symptoms if they are not completely improved.   Return instructions:  Please return to the Emergency Department if you experience worsening symptoms.  Please return the emergency department if you develop any worsening back pain, shortness of breath, chest pain, headaches, dizziness, lightheadedness, or nausea or vomiting that prevents you from keeping anything down. Please return if you have any other emergent concerns.  Additional Information:   Your vital signs today were: BP (!) 164/104    Pulse 79    Temp 97.7 F (36.5 C) (Oral)    Resp 15    SpO2 100%  If your blood pressure (BP) was elevated on multiple readings during this visit above 130 for the top number or above 80 for the bottom number, please have this repeated by your primary care provider within one month. --------------  Thank you for allowing us to participate in your care today.

## 2018-05-01 NOTE — ED Triage Notes (Signed)
Pt here with c/o flank pain, N/V, and abdominal pain.  Pt is having urinary frequency.  Denies fevers, chills, and diarrhea.

## 2018-05-22 ENCOUNTER — Other Ambulatory Visit: Payer: Self-pay

## 2018-05-22 ENCOUNTER — Emergency Department (HOSPITAL_COMMUNITY)
Admission: EM | Admit: 2018-05-22 | Discharge: 2018-05-22 | Disposition: A | Discharge status: Home / Self Care | Payer: Self-pay | Attending: Emergency Medicine | Admitting: Emergency Medicine

## 2018-05-22 ENCOUNTER — Encounter (HOSPITAL_COMMUNITY): Payer: Self-pay | Admitting: Emergency Medicine

## 2018-05-22 DIAGNOSIS — Z79899 Other long term (current) drug therapy: Secondary | ICD-10-CM | POA: Insufficient documentation

## 2018-05-22 DIAGNOSIS — Z7984 Long term (current) use of oral hypoglycemic drugs: Secondary | ICD-10-CM | POA: Insufficient documentation

## 2018-05-22 DIAGNOSIS — R1013 Epigastric pain: Secondary | ICD-10-CM | POA: Insufficient documentation

## 2018-05-22 DIAGNOSIS — I10 Essential (primary) hypertension: Secondary | ICD-10-CM | POA: Insufficient documentation

## 2018-05-22 DIAGNOSIS — E119 Type 2 diabetes mellitus without complications: Secondary | ICD-10-CM | POA: Insufficient documentation

## 2018-05-22 DIAGNOSIS — Z87891 Personal history of nicotine dependence: Secondary | ICD-10-CM | POA: Insufficient documentation

## 2018-05-22 DIAGNOSIS — E1165 Type 2 diabetes mellitus with hyperglycemia: Secondary | ICD-10-CM

## 2018-05-22 DIAGNOSIS — R112 Nausea with vomiting, unspecified: Secondary | ICD-10-CM | POA: Insufficient documentation

## 2018-05-22 LAB — URINALYSIS, ROUTINE W REFLEX MICROSCOPIC
Bacteria, UA: NONE SEEN
Bilirubin Urine: NEGATIVE
Hgb urine dipstick: NEGATIVE
Ketones, ur: NEGATIVE mg/dL
Leukocytes, UA: NEGATIVE
Nitrite: NEGATIVE
PH: 6 (ref 5.0–8.0)
Protein, ur: 100 mg/dL — AB
Specific Gravity, Urine: 1.031 — ABNORMAL HIGH (ref 1.005–1.030)

## 2018-05-22 LAB — CBC WITH DIFFERENTIAL/PLATELET
Abs Immature Granulocytes: 0.01 10*3/uL (ref 0.00–0.07)
Basophils Absolute: 0.1 10*3/uL (ref 0.0–0.1)
Basophils Relative: 1 %
Eosinophils Absolute: 0.1 10*3/uL (ref 0.0–0.5)
Eosinophils Relative: 2 %
HCT: 39.5 % (ref 39.0–52.0)
Hemoglobin: 12.7 g/dL — ABNORMAL LOW (ref 13.0–17.0)
Immature Granulocytes: 0 %
Lymphocytes Relative: 39 %
Lymphs Abs: 2.2 10*3/uL (ref 0.7–4.0)
MCH: 27.7 pg (ref 26.0–34.0)
MCHC: 32.2 g/dL (ref 30.0–36.0)
MCV: 86.1 fL (ref 80.0–100.0)
Monocytes Absolute: 0.3 10*3/uL (ref 0.1–1.0)
Monocytes Relative: 6 %
NEUTROS PCT: 52 %
Neutro Abs: 3 10*3/uL (ref 1.7–7.7)
Platelets: 308 10*3/uL (ref 150–400)
RBC: 4.59 MIL/uL (ref 4.22–5.81)
RDW: 12.7 % (ref 11.5–15.5)
WBC: 5.7 10*3/uL (ref 4.0–10.5)
nRBC: 0 % (ref 0.0–0.2)

## 2018-05-22 LAB — COMPREHENSIVE METABOLIC PANEL
ALT: 19 U/L (ref 0–44)
AST: 35 U/L (ref 15–41)
Albumin: 3.6 g/dL (ref 3.5–5.0)
Alkaline Phosphatase: 108 U/L (ref 38–126)
Anion gap: 10 (ref 5–15)
BUN: 12 mg/dL (ref 6–20)
CO2: 25 mmol/L (ref 22–32)
Calcium: 8.8 mg/dL — ABNORMAL LOW (ref 8.9–10.3)
Chloride: 97 mmol/L — ABNORMAL LOW (ref 98–111)
Creatinine, Ser: 1.02 mg/dL (ref 0.61–1.24)
GFR calc Af Amer: >60 mL/min (ref >60)
Glucose, Bld: 317 mg/dL — ABNORMAL HIGH (ref 70–99)
Potassium: 4.4 mmol/L (ref 3.5–5.1)
Sodium: 132 mmol/L — ABNORMAL LOW (ref 135–145)
Total Bilirubin: 1.7 mg/dL — ABNORMAL HIGH (ref 0.3–1.2)
Total Protein: 7.2 g/dL (ref 6.5–8.1)

## 2018-05-22 LAB — LIPASE, BLOOD: LIPASE: 84 U/L — AB (ref 11–51)

## 2018-05-22 LAB — CBG MONITORING, ED: Glucose-Capillary: 300 mg/dL — ABNORMAL HIGH (ref 70–99)

## 2018-05-22 MED ORDER — INSULIN ASPART 100 UNIT/ML ~~LOC~~ SOLN
8.0000 [IU] | Freq: Once | SUBCUTANEOUS | Status: AC
  Administered 2018-05-22: 8 [IU] via INTRAVENOUS

## 2018-05-22 MED ORDER — ONDANSETRON HCL 4 MG/2ML IJ SOLN
4.0000 mg | Freq: Once | INTRAMUSCULAR | Status: AC
  Administered 2018-05-22: 4 mg via INTRAVENOUS
  Filled 2018-05-22: qty 2

## 2018-05-22 MED ORDER — CARVEDILOL 12.5 MG PO TABS
12.5000 mg | ORAL_TABLET | Freq: Once | ORAL | Status: AC
  Administered 2018-05-22: 12.5 mg via ORAL
  Filled 2018-05-22: qty 1

## 2018-05-22 MED ORDER — LISINOPRIL 20 MG PO TABS
20.0000 mg | ORAL_TABLET | Freq: Once | ORAL | Status: AC
  Administered 2018-05-22: 20 mg via ORAL
  Filled 2018-05-22: qty 1

## 2018-05-22 MED ORDER — SODIUM CHLORIDE 0.9 % IV BOLUS
1000.0000 mL | Freq: Once | INTRAVENOUS | Status: AC
  Administered 2018-05-22: 1000 mL via INTRAVENOUS

## 2018-05-22 MED ORDER — PROMETHAZINE HCL 25 MG PO TABS: 25.0000 mg | ORAL_TABLET | Freq: Four times a day (QID) | ORAL | 0 refills | Status: DC | PRN

## 2018-05-22 MED ORDER — MORPHINE SULFATE (PF) 4 MG/ML IV SOLN
4.0000 mg | Freq: Once | INTRAVENOUS | Status: AC
  Administered 2018-05-22: 4 mg via INTRAVENOUS
  Filled 2018-05-22: qty 1

## 2018-05-22 MED ORDER — GLIPIZIDE 10 MG PO TABS: 10.0000 mg | ORAL_TABLET | Freq: Every day | ORAL | Status: DC

## 2018-05-22 MED ORDER — GLIPIZIDE 10 MG PO TABS
10.0000 mg | ORAL_TABLET | Freq: Every day | ORAL | Status: DC
  Administered 2018-05-22: 10 mg via ORAL
  Filled 2018-05-22: qty 1

## 2018-05-22 MED ORDER — LISINOPRIL 20 MG PO TABS: 20.0000 mg | ORAL_TABLET | Freq: Every day | ORAL | Status: DC

## 2018-05-22 MED ORDER — FAMOTIDINE 20 MG PO TABS: 20.0000 mg | ORAL_TABLET | Freq: Two times a day (BID) | ORAL | 0 refills | Status: DC

## 2018-05-22 NOTE — ED Notes (Signed)
ED Provider at bedside. 

## 2018-05-22 NOTE — ED Triage Notes (Signed)
Patient reports abdominal pain and back pain, vomiting for about 2 days, denies nausea, denies diarrhea. He reports that he was diagnosed with Pancreatitis and has not had a drink for 2 years.

## 2018-05-22 NOTE — ED Provider Notes (Signed)
[facility] EMERGENCY DEPARTMENT Provider Note   CSN: [id number] Arrival date & time: 05/22/18  0754     History   Chief Complaint No chief complaint on file.   HPI Randy [name] is a 38 y.o. male.  Pt presents to the ED today with abdominal pain, n/v.  The pt does have a hx of pancreatitis and feels like he has it again.  The pt has not had any alcohol for 2 years.  The pt does have a hx of DM and HTN.  When he was here last (12/20), he was restarted on glipizide, coreg, and lisinopril.  He was seen by SW to help him get his meds.  Pt did get his meds filled and said he's been taking them; although did not take them today.  The pt has been checking his blood sugars, and said they have been staying in the 300s.  Pt said his n/v and abd pain started again2 days ago.      Past Medical History:  Diagnosis Date  . Hypertension   . Pancreatitis   . Type II diabetes mellitus [facility])     Patient Active Problem List   Diagnosis Date Noted  . Intractable nausea and vomiting 08/31/2015  . Hypertensive urgency 08/31/2015  . Uncontrolled diabetes mellitus (HCC) 08/31/2015  . Marijuana abuse 08/31/2015  . Noncompliance with medication regimen 08/31/2015    Past Surgical History:  Procedure Laterality Date  . NO PAST SURGERIES          Home Medications    Prior to Admission medications   Medication Sig Start Date End Date Taking? Authorizing Provider  carvedilol (COREG) 12.5 MG tablet Take 1 tablet (12.5 mg total) by mouth 2 (two) times daily with a meal. 05/01/18  Yes Dayton Scrape, Alyssa B, PA-C  glipiZIDE (GLUCOTROL) 10 MG tablet Take 1 tablet (10 mg total) by mouth daily before breakfast. 05/01/18  Yes Dayton Scrape, Alyssa B, PA-C  ibuprofen (ADVIL,MOTRIN) 800 MG tablet Take 1 tablet (800 mg total) by mouth every 8 (eight) hours as needed for moderate pain. Patient taking differently: Take 200 mg by mouth every 8 (eight) hours as needed for moderate pain.  10/10/17   Yes Eustace Moore, MD  lisinopril (PRINIVIL,ZESTRIL) 20 MG tablet Take 1 tablet (20 mg total) by mouth daily. 05/01/18  Yes Dayton Scrape, Alyssa B, PA-C  dicyclomine (BENTYL) 20 MG tablet Take 1 tablet (20 mg total) by mouth 2 (two) times daily as needed for spasms (abdominal pain). Patient not taking: Reported on 03/24/2018 02/11/17   Alvira Monday, MD  famotidine (PEPCID) 20 MG tablet Take 1 tablet (20 mg total) by mouth 2 (two) times daily. 05/22/18   Jacalyn Lefevre, MD  glucose blood (FREESTYLE TEST STRIPS) test strip Use as instructed 05/01/18   Aviva Kluver B, PA-C  Lancets (FREESTYLE) lancets Use as instructed 05/01/18   Aviva Kluver B, PA-C  methocarbamol (ROBAXIN) 500 MG tablet Take 1 tablet (500 mg total) by mouth 2 (two) times daily. Patient not taking: Reported on 05/22/2018 05/01/18   Aviva Kluver B, PA-C  ondansetron (ZOFRAN ODT) 4 MG disintegrating tablet Take 1 tablet (4 mg total) by mouth every 8 (eight) hours as needed for nausea or vomiting. Patient not taking: Reported on 03/24/2018 02/11/17   Alvira Monday, MD  ondansetron (ZOFRAN) 4 MG tablet Take 1 tablet (4 mg total) by mouth every 6 (six) hours. Patient not taking: Reported on 03/24/2018 09/28/17   Terrilee Files, MD  promethazine [facility])  25 MG tablet Take 1 tablet (25 mg total) by mouth every 6 (six) hours as needed for nausea or vomiting. 05/22/18   Jacalyn LefevreHaviland, Lilo Wallington, MD    Family History Family History  Problem Relation Age of Onset  . Hypertension Mother   . Diabetes Father   . Hypertension Father     Social History Social History   Tobacco Use  . Smoking status: Former Smoker    Packs/day: 0.33    Years: 17.00    Pack years: 5.61    Types: Cigarettes    Last attempt to quit: 12/26/2011    Years since quitting: 6.4  . Smokeless tobacco: Never Used  Substance Use Topics  . Alcohol use: No  . Drug use: Yes    Types: Marijuana    Comment: 08/31/2015 "frequent times/day"     Allergies     Patient has no known allergies.   Review of Systems Review of Systems  Gastrointestinal: Positive for abdominal pain, nausea and vomiting.  All other systems reviewed and are negative.    Physical Exam Updated Vital Signs BP (!) 173/99   Pulse 67   Temp 97.7 F (36.5 C) (Oral)   Resp 16   Ht 5\' 8"  (1.727 m)   Wt 86.2 kg   SpO2 100%   BMI 28.89 kg/m   Physical Exam Vitals signs and nursing note reviewed.  Constitutional:      Appearance: Normal appearance. He is normal weight.  HENT:     Head: Normocephalic and atraumatic.     Right Ear: External ear normal.     Left Ear: External ear normal.     Nose: Nose normal.     Mouth/Throat:     Mouth: Mucous membranes are dry.     Pharynx: Oropharynx is clear.  Eyes:     Extraocular Movements: Extraocular movements intact.     Conjunctiva/sclera: Conjunctivae normal.     Pupils: Pupils are equal, round, and reactive to light.  Neck:     Musculoskeletal: Normal range of motion and neck supple.  Cardiovascular:     Rate and Rhythm: Normal rate and regular rhythm.     Pulses: Normal pulses.     Heart sounds: Normal heart sounds.  Pulmonary:     Effort: Pulmonary effort is normal.     Breath sounds: Normal breath sounds.  Abdominal:     General: Abdomen is flat. Bowel sounds are normal.     Palpations: Abdomen is soft.     Tenderness: There is abdominal tenderness in the epigastric area.  Musculoskeletal: Normal range of motion.  Skin:    General: Skin is warm.     Capillary Refill: Capillary refill takes less than 2 seconds.  Neurological:     General: No focal deficit present.     Mental Status: He is alert and oriented to person, place, and time.  Psychiatric:        Mood and Affect: Mood normal.        Behavior: Behavior normal.        Thought Content: Thought content normal.        Judgment: Judgment normal.      ED Treatments / Results  Labs (all labs ordered are listed, but only abnormal results are  displayed) Labs Reviewed  CBC WITH DIFFERENTIAL/PLATELET - Abnormal; Notable for the following components:      Result Value   Hemoglobin 12.7 (*)    All other components within normal limits  COMPREHENSIVE METABOLIC PANEL -  Abnormal; Notable for the following components:   Sodium 132 (*)    Chloride 97 (*)    Glucose, Bld 317 (*)    Calcium 8.8 (*)    Total Bilirubin 1.7 (*)    All other components within normal limits  LIPASE, BLOOD - Abnormal; Notable for the following components:   Lipase 84 (*)    All other components within normal limits  URINALYSIS, ROUTINE W REFLEX MICROSCOPIC - Abnormal; Notable for the following components:   Specific Gravity, Urine 1.031 (*)    Glucose, UA >=500 (*)    Protein, ur 100 (*)    All other components within normal limits  CBG MONITORING, ED - Abnormal; Notable for the following components:   Glucose-Capillary 300 (*)    All other components within normal limits    EKG None  Radiology No results found.  Procedures Procedures (including critical care time)  Medications Ordered in ED Medications  glipiZIDE (GLUCOTROL) tablet 10 mg (10 mg Oral Given 05/22/18 0958)  morphine 4 MG/ML injection 4 mg (4 mg Intravenous Given 05/22/18 0826)  ondansetron (ZOFRAN) injection 4 mg (4 mg Intravenous Given 05/22/18 0826)  sodium chloride 0.9 % bolus 1,000 mL (0 mLs Intravenous Stopped 05/22/18 0927)  insulin aspart (novoLOG) injection 8 Units (8 Units Intravenous Given 05/22/18 0958)  carvedilol (COREG) tablet 12.5 mg (12.5 mg Oral Given 05/22/18 0958)  lisinopril (PRINIVIL,ZESTRIL) tablet 20 mg (20 mg Oral Given 05/22/18 7035)     Initial Impression / Assessment and Plan / ED Course  I have reviewed the triage vital signs and the nursing notes.  Pertinent labs & imaging results that were available during my care of the patient were reviewed by me and considered in my medical decision making (see chart for details).    Lipase level is minimally  elevated.  He is feeling much better.  He had a CT scan at the end of December with the same presentation and there was no evidence of pancreatitis.  Possibly pain from gerd or gastroparesis.   He will be given a dose of insulin in ED and a dose of his normal home meds.  He has an appt with [facility] next week and knows to discuss his meds.  Final Clinical Impressions(s) / ED Diagnoses   Final diagnoses:  Poorly controlled type 2 diabetes mellitus (HCC)  Epigastric pain  Non-intractable vomiting with nausea, unspecified vomiting type    ED Discharge Orders         Ordered    promethazine (PHENERGAN) 25 MG tablet  Every 6 hours PRN     05/22/18 1013    famotidine (PEPCID) 20 MG tablet  2 times daily     05/22/18 1013           Jacalyn Lefevre, MD 05/22/18 1014

## 2018-05-22 NOTE — Discharge Instructions (Signed)
Continue to take blood pressure and diabetes medicines as prescribed.  Check your blood sugar daily.

## 2018-05-29 ENCOUNTER — Emergency Department (HOSPITAL_COMMUNITY)
Admission: EM | Admit: 2018-05-29 | Discharge: 2018-05-29 | Disposition: A | Discharge status: Home / Self Care | Payer: Self-pay | Attending: Emergency Medicine | Admitting: Emergency Medicine

## 2018-05-29 ENCOUNTER — Other Ambulatory Visit: Payer: Self-pay

## 2018-05-29 DIAGNOSIS — R112 Nausea with vomiting, unspecified: Secondary | ICD-10-CM | POA: Insufficient documentation

## 2018-05-29 DIAGNOSIS — Z7984 Long term (current) use of oral hypoglycemic drugs: Secondary | ICD-10-CM | POA: Insufficient documentation

## 2018-05-29 DIAGNOSIS — Z87891 Personal history of nicotine dependence: Secondary | ICD-10-CM | POA: Insufficient documentation

## 2018-05-29 DIAGNOSIS — Z79899 Other long term (current) drug therapy: Secondary | ICD-10-CM | POA: Insufficient documentation

## 2018-05-29 DIAGNOSIS — I10 Essential (primary) hypertension: Secondary | ICD-10-CM | POA: Insufficient documentation

## 2018-05-29 DIAGNOSIS — E119 Type 2 diabetes mellitus without complications: Secondary | ICD-10-CM | POA: Insufficient documentation

## 2018-05-29 LAB — CBC WITH DIFFERENTIAL/PLATELET
Abs Immature Granulocytes: 0.02 10*3/uL (ref 0.00–0.07)
BASOS ABS: 0.1 10*3/uL (ref 0.0–0.1)
BASOS PCT: 2 %
Eosinophils Absolute: 0.1 10*3/uL (ref 0.0–0.5)
Eosinophils Relative: 2 %
HCT: 42.8 % (ref 39.0–52.0)
Hemoglobin: 13.9 g/dL (ref 13.0–17.0)
Immature Granulocytes: 0 %
Lymphocytes Relative: 40 %
Lymphs Abs: 2.4 10*3/uL (ref 0.7–4.0)
MCH: 27.7 pg (ref 26.0–34.0)
MCHC: 32.5 g/dL (ref 30.0–36.0)
MCV: 85.3 fL (ref 80.0–100.0)
Monocytes Absolute: 0.4 10*3/uL (ref 0.1–1.0)
Monocytes Relative: 6 %
NRBC: 0 % (ref 0.0–0.2)
Neutro Abs: 2.9 10*3/uL (ref 1.7–7.7)
Neutrophils Relative %: 50 %
PLATELETS: 394 10*3/uL (ref 150–400)
RBC: 5.02 MIL/uL (ref 4.22–5.81)
RDW: 13 % (ref 11.5–15.5)
WBC: 5.9 10*3/uL (ref 4.0–10.5)

## 2018-05-29 LAB — URINALYSIS, ROUTINE W REFLEX MICROSCOPIC
Bacteria, UA: NONE SEEN
Bilirubin Urine: NEGATIVE
Ketones, ur: NEGATIVE mg/dL
Leukocytes, UA: NEGATIVE
NITRITE: NEGATIVE
Protein, ur: 100 mg/dL — AB
Specific Gravity, Urine: 1.032 — ABNORMAL HIGH (ref 1.005–1.030)
pH: 5 (ref 5.0–8.0)

## 2018-05-29 LAB — COMPREHENSIVE METABOLIC PANEL
ALT: 18 U/L (ref 0–44)
AST: 28 U/L (ref 15–41)
Albumin: 3.4 g/dL — ABNORMAL LOW (ref 3.5–5.0)
Alkaline Phosphatase: 75 U/L (ref 38–126)
Anion gap: 7 (ref 5–15)
BUN: 14 mg/dL (ref 6–20)
CO2: 26 mmol/L (ref 22–32)
Calcium: 8.9 mg/dL (ref 8.9–10.3)
Chloride: 102 mmol/L (ref 98–111)
Creatinine, Ser: 0.84 mg/dL (ref 0.61–1.24)
GFR calc Af Amer: >60 mL/min (ref >60)
GFR calc non Af Amer: >60 mL/min (ref >60)
Glucose, Bld: 272 mg/dL — ABNORMAL HIGH (ref 70–99)
Potassium: 5.6 mmol/L — ABNORMAL HIGH (ref 3.5–5.1)
Sodium: 135 mmol/L (ref 135–145)
Total Bilirubin: 0.9 mg/dL (ref 0.3–1.2)
Total Protein: 6.7 g/dL (ref 6.5–8.1)

## 2018-05-29 LAB — LIPASE, BLOOD: Lipase: 56 U/L — ABNORMAL HIGH (ref 11–51)

## 2018-05-29 LAB — CBG MONITORING, ED: Glucose-Capillary: 265 mg/dL — ABNORMAL HIGH (ref 70–99)

## 2018-05-29 MED ORDER — ONDANSETRON HCL 4 MG/2ML IJ SOLN
4.0000 mg | Freq: Once | INTRAMUSCULAR | Status: AC
  Administered 2018-05-29: 4 mg via INTRAVENOUS
  Filled 2018-05-29: qty 2

## 2018-05-29 MED ORDER — ONDANSETRON 4 MG PO TBDP: 4.0000 mg | ORAL_TABLET | Freq: Three times a day (TID) | ORAL | 0 refills | Status: DC | PRN

## 2018-05-29 MED ORDER — SODIUM CHLORIDE 0.9 % IV BOLUS
1000.0000 mL | Freq: Once | INTRAVENOUS | Status: AC
  Administered 2018-05-29: 1000 mL via INTRAVENOUS

## 2018-05-29 NOTE — ED Notes (Signed)
Discharge instructions and prescription discussed with Pt. Pt verbalized understanding. Pt stable and ambulatory.    

## 2018-05-29 NOTE — ED Triage Notes (Signed)
Pt reports back pain, n/v/d that started yesterday and worsened today. Pt reports that he was here a few weeks ago and had the same type of back pain then.

## 2018-05-29 NOTE — Discharge Instructions (Signed)
You will need to be seen by your doctor within the next several days for recheck.  Please continue to take your medications including both your blood pressure and diabetic medications.  You will likely need referral to a GI specialist if you continue to have nausea and vomiting.  Emergency department for severe or worsening symptoms

## 2018-05-29 NOTE — ED Provider Notes (Signed)
[facility] EMERGENCY DEPARTMENT Provider Note   CSN: [id number] Arrival date & time: 05/29/18  0940     History   Chief Complaint Chief Complaint  Patient presents with  . Nausea  . Diarrhea  . Back Pain    HPI Randy [name] is a 38 y.o. male.  HPI  38 year old male, known history of pancreatitis, hypertension, type 2 diabetes which according to the medical record is relatively poorly controlled partially secondary to the patient's noncompliance with medication.  The patient denies any abdominal pain but states that over the last 48 hours he developed a gradual onset of nausea vomiting and lower back pain.  He denies abdominal discomfort fevers or chills though his wife states to him that "you smell like you have a fever".  He denies having any increasing frequency of urination but this is a chronic finding for him.  He has lost weight over time, denies any rashes or swelling of the legs.  He has taken nausea medicine prior to arrival without any improvement.  The patient was seen approximately 1 week ago during which time he had a minimal elevation in his lipase.  It was noted that his CT scan had been performed at the end of December about 2 weeks ago for the same presentation and there was no evidence of pancreatitis at that time.  The patient was given insulin in the emergency department, doses of his home medications and was discharged without any signs of diabetic ketoacidosis.  He did well until 2 days ago when his symptoms returned.  He does also complain of 3 episodes of watery diarrhea in the last 24 hours  Past Medical History:  Diagnosis Date  . Hypertension   . Pancreatitis   . Type II diabetes mellitus [facility])     Patient Active Problem List   Diagnosis Date Noted  . Intractable nausea and vomiting 08/31/2015  . Hypertensive urgency 08/31/2015  . Uncontrolled diabetes mellitus (HCC) 08/31/2015  . Marijuana abuse 08/31/2015  . Noncompliance with  medication regimen 08/31/2015    Past Surgical History:  Procedure Laterality Date  . NO PAST SURGERIES          Home Medications    Prior to Admission medications   Medication Sig Start Date End Date Taking? Authorizing Provider  carvedilol (COREG) 12.5 MG tablet Take 1 tablet (12.5 mg total) by mouth 2 (two) times daily with a meal. 05/01/18  Yes [place] Scrape, Alyssa B, PA-C  famotidine (PEPCID) 20 MG tablet Take 1 tablet (20 mg total) by mouth 2 (two) times daily. 05/22/18  Yes Jacalyn Lefevre, MD  glipiZIDE (GLUCOTROL) 10 MG tablet Take 1 tablet (10 mg total) by mouth daily before breakfast. 05/01/18  Yes Dayton Scrape, Alyssa B, PA-C  lisinopril (PRINIVIL,ZESTRIL) 20 MG tablet Take 1 tablet (20 mg total) by mouth daily. 05/01/18  Yes [place] Scrape, Alyssa B, PA-C  ondansetron (ZOFRAN) 4 MG tablet Take 1 tablet (4 mg total) by mouth every 6 (six) hours. 09/28/17  Yes Terrilee Files, MD  promethazine (PHENERGAN) 25 MG tablet Take 1 tablet (25 mg total) by mouth every 6 (six) hours as needed for nausea or vomiting. 05/22/18  Yes Jacalyn Lefevre, MD  dicyclomine (BENTYL) 20 MG tablet Take 1 tablet (20 mg total) by mouth 2 (two) times daily as needed for spasms (abdominal pain). Patient not taking: Reported on 03/24/2018 02/11/17   Alvira Monday, MD  glucose blood (FREESTYLE TEST STRIPS) test strip Use as instructed 05/01/18   Dayton Scrape,  Alyssa B, PA-C  Lancets (FREESTYLE) lancets Use as instructed 05/01/18   Aviva KluverMurray, Alyssa B, PA-C  methocarbamol (ROBAXIN) 500 MG tablet Take 1 tablet (500 mg total) by mouth 2 (two) times daily. Patient not taking: Reported on 05/22/2018 05/01/18   Aviva KluverMurray, Alyssa B, PA-C  ondansetron (ZOFRAN ODT) 4 MG disintegrating tablet Take 1 tablet (4 mg total) by mouth every 8 (eight) hours as needed for nausea. 05/29/18   Eber HongMiller, Tadeo Besecker, MD    Family History Family History  Problem Relation Age of Onset  . Hypertension Mother   . Diabetes Father   . Hypertension Father     Social  History Social History   Tobacco Use  . Smoking status: Former Smoker    Packs/day: 0.33    Years: 17.00    Pack years: 5.61    Types: Cigarettes    Last attempt to quit: 12/26/2011    Years since quitting: 6.4  . Smokeless tobacco: Never Used  Substance Use Topics  . Alcohol use: No  . Drug use: Yes    Types: Marijuana    Comment: 08/31/2015 "frequent times/day"     Allergies   Patient has no known allergies.   Review of Systems Review of Systems  All other systems reviewed and are negative.    Physical Exam Updated Vital Signs BP (!) 180/103   Pulse 75   Temp 97.7 F (36.5 C) (Oral)   Resp 16   Ht 1.727 m (5\' 8" )   Wt 89.4 kg   SpO2 100%   BMI 29.95 kg/m   Physical Exam Vitals signs and nursing note reviewed.  Constitutional:      General: He is not in acute distress.    Appearance: He is well-developed.  HENT:     Head: Normocephalic and atraumatic.     Mouth/Throat:     Pharynx: No oropharyngeal exudate.  Eyes:     General: No scleral icterus.       Right eye: No discharge.        Left eye: No discharge.     Conjunctiva/sclera: Conjunctivae normal.     Pupils: Pupils are equal, round, and reactive to light.  Neck:     Musculoskeletal: Normal range of motion and neck supple.     Thyroid: No thyromegaly.     Vascular: No JVD.  Cardiovascular:     Rate and Rhythm: Normal rate and regular rhythm.     Heart sounds: Normal heart sounds. No murmur. No friction rub. No gallop.   Pulmonary:     Effort: Pulmonary effort is normal. No respiratory distress.     Breath sounds: Normal breath sounds. No wheezing or rales.  Abdominal:     General: Bowel sounds are normal. There is no distension.     Palpations: Abdomen is soft. There is no mass.     Tenderness: There is no abdominal tenderness.  Musculoskeletal: Normal range of motion.        General: No tenderness.  Lymphadenopathy:     Cervical: No cervical adenopathy.  Skin:    General: Skin is warm  and dry.     Findings: No erythema or rash.  Neurological:     Mental Status: He is alert.     Coordination: Coordination normal.  Psychiatric:        Behavior: Behavior normal.      ED Treatments / Results  Labs (all labs ordered are listed, but only abnormal results are displayed) Labs Reviewed  URINALYSIS, ROUTINE  W REFLEX MICROSCOPIC - Abnormal; Notable for the following components:      Result Value   Specific Gravity, Urine 1.032 (*)    Glucose, UA >=500 (*)    Hgb urine dipstick SMALL (*)    Protein, ur 100 (*)    All other components within normal limits  LIPASE, BLOOD - Abnormal; Notable for the following components:   Lipase 56 (*)    All other components within normal limits  COMPREHENSIVE METABOLIC PANEL - Abnormal; Notable for the following components:   Potassium 5.6 (*)    Glucose, Bld 272 (*)    Albumin 3.4 (*)    All other components within normal limits  CBG MONITORING, ED - Abnormal; Notable for the following components:   Glucose-Capillary 265 (*)    All other components within normal limits  CBC WITH DIFFERENTIAL/PLATELET    EKG None  Radiology No results found.  Procedures Procedures (including critical care time)  Medications Ordered in ED Medications  sodium chloride 0.9 % bolus 1,000 mL (0 mLs Intravenous Stopped 05/29/18 1157)  ondansetron (ZOFRAN) injection 4 mg (4 mg Intravenous Given 05/29/18 1019)     Initial Impression / Assessment and Plan / ED Course  I have reviewed the triage vital signs and the nursing notes.  Pertinent labs & imaging results that were available during my care of the patient were reviewed by me and considered in my medical decision making (see chart for details).  Clinical Course as of May 29 1352  Fri May 29, 2018  1321 Again the patient has no signs of ketones in the urine, CO2 is normal at 26, there is no increased anion gap, lipase is 56 and glucose is 265.  IV fluids have been given, the patient is  hypertensive but otherwise in no distress and improved after Zofran and fluids.   [BM]    Clinical Course User Index [BM] Eber Hong, MD   The patient has no abdominal tenderness whatsoever, his vital signs show some hypertension but no tachycardia.  His exam is otherwise reassuring.  We will start with IV fluids antiemetics and a search for the cause of the nausea which may be hyperglycemia, he may have some diabetic gastroparesis, this may be pancreatitis though less likely given no epigastric tenderness.  The patient is agreeable to the plan  Nausea and vomiting has resolved, Pt has been given fluids Feels better without sx Labs shows hyperglycemia but no other acute finingds BP is persistenlty mildly to moderately elevated Needs close f/u Agreeable.   Final Clinical Impressions(s) / ED Diagnoses   Final diagnoses:  Non-intractable vomiting with nausea, unspecified vomiting type  Essential hypertension    ED Discharge Orders         Ordered    ondansetron (ZOFRAN ODT) 4 MG disintegrating tablet  Every 8 hours PRN     05/29/18 1352           Eber Hong, MD 05/29/18 1354

## 2018-10-08 ENCOUNTER — Other Ambulatory Visit: Payer: Self-pay

## 2018-10-08 ENCOUNTER — Encounter (HOSPITAL_COMMUNITY): Payer: Self-pay

## 2018-10-08 ENCOUNTER — Emergency Department (HOSPITAL_COMMUNITY)
Admission: EM | Admit: 2018-10-08 | Discharge: 2018-10-08 | Disposition: A | Discharge status: Home / Self Care | Payer: Self-pay | Attending: Emergency Medicine | Admitting: Emergency Medicine

## 2018-10-08 DIAGNOSIS — Z79899 Other long term (current) drug therapy: Secondary | ICD-10-CM | POA: Insufficient documentation

## 2018-10-08 DIAGNOSIS — Z7984 Long term (current) use of oral hypoglycemic drugs: Secondary | ICD-10-CM | POA: Insufficient documentation

## 2018-10-08 DIAGNOSIS — E1165 Type 2 diabetes mellitus with hyperglycemia: Secondary | ICD-10-CM | POA: Insufficient documentation

## 2018-10-08 DIAGNOSIS — I10 Essential (primary) hypertension: Secondary | ICD-10-CM | POA: Insufficient documentation

## 2018-10-08 DIAGNOSIS — R739 Hyperglycemia, unspecified: Secondary | ICD-10-CM

## 2018-10-08 DIAGNOSIS — K859 Acute pancreatitis without necrosis or infection, unspecified: Secondary | ICD-10-CM | POA: Insufficient documentation

## 2018-10-08 DIAGNOSIS — Z87891 Personal history of nicotine dependence: Secondary | ICD-10-CM | POA: Insufficient documentation

## 2018-10-08 LAB — URINALYSIS, ROUTINE W REFLEX MICROSCOPIC
Bilirubin Urine: NEGATIVE
Glucose, UA: >=500 mg/dL — AB
Hgb urine dipstick: NEGATIVE
Ketones, ur: 5 mg/dL — AB
Leukocytes,Ua: NEGATIVE
Nitrite: NEGATIVE
Protein, ur: 100 mg/dL — AB
Specific Gravity, Urine: 1.028 (ref 1.005–1.030)
pH: 6 (ref 5.0–8.0)

## 2018-10-08 LAB — COMPREHENSIVE METABOLIC PANEL
ALT: 24 U/L (ref 0–44)
AST: 28 U/L (ref 15–41)
Albumin: 3.6 g/dL (ref 3.5–5.0)
Alkaline Phosphatase: 105 U/L (ref 38–126)
Anion gap: 6 (ref 5–15)
BUN: 12 mg/dL (ref 6–20)
CO2: 29 mmol/L (ref 22–32)
Calcium: 8.6 mg/dL — ABNORMAL LOW (ref 8.9–10.3)
Chloride: 99 mmol/L (ref 98–111)
Creatinine, Ser: 0.89 mg/dL (ref 0.61–1.24)
GFR calc Af Amer: >60 mL/min (ref >60)
GFR calc non Af Amer: >60 mL/min (ref >60)
Glucose, Bld: 273 mg/dL — ABNORMAL HIGH (ref 70–99)
Potassium: 3.6 mmol/L (ref 3.5–5.1)
Sodium: 134 mmol/L — ABNORMAL LOW (ref 135–145)
Total Bilirubin: 0.6 mg/dL (ref 0.3–1.2)
Total Protein: 7.3 g/dL (ref 6.5–8.1)

## 2018-10-08 LAB — CBC WITH DIFFERENTIAL/PLATELET
Abs Immature Granulocytes: 0.02 10*3/uL (ref 0.00–0.07)
Basophils Absolute: 0.1 10*3/uL (ref 0.0–0.1)
Basophils Relative: 1 %
Eosinophils Absolute: 0.1 10*3/uL (ref 0.0–0.5)
Eosinophils Relative: 1 %
HCT: 37 % — ABNORMAL LOW (ref 39.0–52.0)
Hemoglobin: 12.2 g/dL — ABNORMAL LOW (ref 13.0–17.0)
Immature Granulocytes: 0 %
Lymphocytes Relative: 37 %
Lymphs Abs: 3.2 10*3/uL (ref 0.7–4.0)
MCH: 28 pg (ref 26.0–34.0)
MCHC: 33 g/dL (ref 30.0–36.0)
MCV: 85.1 fL (ref 80.0–100.0)
Monocytes Absolute: 0.5 10*3/uL (ref 0.1–1.0)
Monocytes Relative: 6 %
Neutro Abs: 4.8 10*3/uL (ref 1.7–7.7)
Neutrophils Relative %: 55 %
Platelets: 217 10*3/uL (ref 150–400)
RBC: 4.35 MIL/uL (ref 4.22–5.81)
RDW: 12.1 % (ref 11.5–15.5)
WBC: 8.7 10*3/uL (ref 4.0–10.5)
nRBC: 0 % (ref 0.0–0.2)

## 2018-10-08 LAB — CBG MONITORING, ED: Glucose-Capillary: 298 mg/dL — ABNORMAL HIGH (ref 70–99)

## 2018-10-08 LAB — LIPASE, BLOOD: Lipase: 53 U/L — ABNORMAL HIGH (ref 11–51)

## 2018-10-08 MED ORDER — SODIUM CHLORIDE 0.9 % IV BOLUS (SEPSIS)
1000.0000 mL | Freq: Once | INTRAVENOUS | Status: AC
  Administered 2018-10-08: 1000 mL via INTRAVENOUS

## 2018-10-08 MED ORDER — ONDANSETRON HCL 4 MG/2ML IJ SOLN
4.0000 mg | Freq: Once | INTRAMUSCULAR | Status: AC
  Administered 2018-10-08: 4 mg via INTRAVENOUS
  Filled 2018-10-08: qty 2

## 2018-10-08 MED ORDER — FENTANYL CITRATE (PF) 100 MCG/2ML IJ SOLN
100.0000 ug | Freq: Once | INTRAMUSCULAR | Status: AC
  Administered 2018-10-08: 03:00:00 100 ug via INTRAVENOUS
  Filled 2018-10-08: qty 2

## 2018-10-08 MED ORDER — HYDROCODONE-ACETAMINOPHEN 5-325 MG PO TABS: 1.0000 | ORAL_TABLET | Freq: Four times a day (QID) | ORAL | 0 refills | Status: DC | PRN

## 2018-10-08 NOTE — ED Triage Notes (Signed)
Pt BIB [facility] from home. Pt reports his blood sugar is high which is causing his "pancerititis to flare up and cause pain." Pt has not drank alcohol in years and reports that when his BP and CBG get high, it causes this pain. Pt denies CP, SHOB, fevers, N/V/D or chills.   CBG: 298

## 2018-10-08 NOTE — ED Notes (Signed)
Pt given water at this time for oral challenge.  Pt able to tolerate without any nausea or pain

## 2018-10-08 NOTE — ED Notes (Signed)
Bed: WA09 Expected date:  Expected time:  Means of arrival:  Comments: EMS pancreatitis

## 2018-10-08 NOTE — ED Provider Notes (Signed)
[facility]-EMERGENCY DEPT Provider Note   CSN: [id number] Arrival date & time: 10/08/18  0241    History   Chief Complaint Chief Complaint  Patient presents with  . Hyperglycemia    HPI Randy [name] is a 38 y.o. male.     The history is provided by the patient.  Hyperglycemia  Severity:  Moderate Onset quality:  Gradual Timing:  Constant Progression:  Worsening Chronicity:  Recurrent Diabetes status:  Controlled with oral medications Relieved by:  Nothing Associated symptoms: abdominal pain and nausea   Associated symptoms: no chest pain, no dysuria and no fever    Patient with history of hypertension, diabetes, pancreatitis Reports over the past several days has had increasing abdominal pain as well as back pain.  He reports this is similar to prior episodes of pancreatitis.  His abdominal pain is mostly focused in the left upper quadrant No fever/vomiting. He also reports hyperglycemia. He just recently got back on his oral diabetic and hypertensive medicines in the past 1.5 weeks   Past Medical History:  Diagnosis Date  . Hypertension   . Pancreatitis   . Type II diabetes mellitus [facility](HCC)     Patient Active Problem List   Diagnosis Date Noted  . Intractable nausea and vomiting 08/31/2015  . Hypertensive urgency 08/31/2015  . Uncontrolled diabetes mellitus (HCC) 08/31/2015  . Marijuana abuse 08/31/2015  . Noncompliance with medication regimen 08/31/2015    Past Surgical History:  Procedure Laterality Date  . NO PAST SURGERIES          Home Medications    Prior to Admission medications   Medication Sig Start Date End Date Taking? Authorizing Provider  carvedilol (COREG) 12.5 MG tablet Take 1 tablet (12.5 mg total) by mouth 2 (two) times daily with a meal. 05/01/18   Aviva KluverMurray, Alyssa B, PA-C  famotidine (PEPCID) 20 MG tablet Take 1 tablet (20 mg total) by mouth 2 (two) times daily. 05/22/18   Jacalyn LefevreHaviland, Julie, MD  glipiZIDE  (GLUCOTROL) 10 MG tablet Take 1 tablet (10 mg total) by mouth daily before breakfast. 05/01/18   Aviva KluverMurray, Alyssa B, PA-C  glucose blood (FREESTYLE TEST STRIPS) test strip Use as instructed 05/01/18   Aviva KluverMurray, Alyssa B, PA-C  Lancets (FREESTYLE) lancets Use as instructed 05/01/18   Aviva KluverMurray, Alyssa B, PA-C  lisinopril (PRINIVIL,ZESTRIL) 20 MG tablet Take 1 tablet (20 mg total) by mouth daily. 05/01/18   Elisha PonderMurray, Alyssa B, PA-C    Family History Family History  Problem Relation Age of Onset  . Hypertension Mother   . Diabetes Father   . Hypertension Father     Social History Social History   Tobacco Use  . Smoking status: Former Smoker    Packs/day: 0.33    Years: 17.00    Pack years: 5.61    Types: Cigarettes    Last attempt to quit: 12/26/2011    Years since quitting: 6.7  . Smokeless tobacco: Never Used  Substance Use Topics  . Alcohol use: No  . Drug use: Yes    Types: Marijuana    Comment: 08/31/2015 "frequent times/day"     Allergies   Patient has no known allergies.   Review of Systems Review of Systems  Constitutional: Negative for fever.  Cardiovascular: Negative for chest pain.  Gastrointestinal: Positive for abdominal pain and nausea.  Genitourinary: Negative for dysuria.  All other systems reviewed and are negative.    Physical Exam Updated Vital Signs BP (!) 177/99   Pulse 70  Temp 98.1 F (36.7 C)   Resp 14   Ht 1.727 m (5\' 8" )   Wt 86.2 kg   SpO2 99%   BMI 28.89 kg/m   Physical Exam CONSTITUTIONAL: Well developed/well nourished HEAD: Normocephalic/atraumatic EYES: EOMI/PERRL, no icterus ENMT: Mucous membranes moist NECK: supple no meningeal signs SPINE/BACK:entire spine nontender CV: S1/S2 noted, no murmurs/rubs/gallops noted LUNGS: Lungs are clear to auscultation bilaterally, no apparent distress ABDOMEN: soft, mild LUQ tenderness, no rebound or guarding, bowel sounds noted throughout abdomen GU:no cva tenderness NEURO: Pt is  awake/alert/appropriate, moves all extremitiesx4.  No facial droop.   EXTREMITIES: pulses normal/equal, full ROM SKIN: warm, color normal PSYCH: no abnormalities of mood noted, alert and oriented to situation   ED Treatments / Results  Labs (all labs ordered are listed, but only abnormal results are displayed) Labs Reviewed  CBC WITH DIFFERENTIAL/PLATELET - Abnormal; Notable for the following components:      Result Value   Hemoglobin 12.2 (*)    HCT 37.0 (*)    All other components within normal limits  COMPREHENSIVE METABOLIC PANEL - Abnormal; Notable for the following components:   Sodium 134 (*)    Glucose, Bld 273 (*)    Calcium 8.6 (*)    All other components within normal limits  LIPASE, BLOOD - Abnormal; Notable for the following components:   Lipase 53 (*)    All other components within normal limits  URINALYSIS, ROUTINE W REFLEX MICROSCOPIC - Abnormal; Notable for the following components:   Glucose, UA >=500 (*)    Ketones, ur 5 (*)    Protein, ur 100 (*)    Bacteria, UA RARE (*)    All other components within normal limits  CBG MONITORING, ED - Abnormal; Notable for the following components:   Glucose-Capillary 298 (*)    All other components within normal limits    EKG EKG Interpretation  Date/Time:  Thursday Oct 08 2018 03:00:00 EDT Ventricular Rate:  62 PR Interval:    QRS Duration: 86 QT Interval:  370 QTC Calculation: 376 R Axis:   61 Text Interpretation:  Sinus rhythm Nonspecific T wave abnormality No significant change was found Confirmed by Zadie Rhine ([id number]) on 10/08/2018 3:06:40 AM   Radiology No results found.  Procedures Procedures Medications Ordered in ED Medications  fentaNYL (SUBLIMAZE) injection 100 mcg (100 mcg Intravenous Given 10/08/18 0323)  ondansetron (ZOFRAN) injection 4 mg (4 mg Intravenous Given 10/08/18 0324)  sodium chloride 0.9 % bolus 1,000 mL (0 mLs Intravenous Stopped 10/08/18 0423)     Initial Impression /  Assessment and Plan / ED Course  I have reviewed the triage vital signs and the nursing notes.  Pertinent labs  results that were available during my care of the patient were reviewed by me and considered in my medical decision making (see chart for details).   The patient was counseled on the dangers of tobacco use, and was advised to quit.  Reviewed strategies to maximize success, including support of family/friends.        4:23 AM Pt presents with abd pain and hyperglycemia His abdominal exam is unremarkable No signs of DKA Pt is feeling improved Will try PO Challenge 5:30 AM Patient is improved, abdomen soft nontender.  He is taking p.o. fluids. He has responded to IV fluids. Patient feels comfortable for discharge home.  Will treat as a mild pancreatitis.  Final Clinical Impressions(s) / ED Diagnoses   Final diagnoses:  Hyperglycemia  Acute pancreatitis without infection or necrosis,  unspecified pancreatitis type    ED Discharge Orders         Ordered    HYDROcodone-acetaminophen (NORCO/VICODIN) 5-325 MG tablet  Every 6 hours PRN     10/08/18 0529           Zadie Rhine, MD 10/08/18 [phone]

## 2019-04-14 ENCOUNTER — Other Ambulatory Visit: Payer: Self-pay

## 2019-04-14 ENCOUNTER — Encounter (HOSPITAL_COMMUNITY): Payer: Self-pay | Admitting: Emergency Medicine

## 2019-04-14 ENCOUNTER — Emergency Department (HOSPITAL_COMMUNITY)
Admission: EM | Admit: 2019-04-14 | Discharge: 2019-04-14 | Disposition: A | Discharge status: Home / Self Care | Payer: Self-pay | Attending: Emergency Medicine | Admitting: Emergency Medicine

## 2019-04-14 DIAGNOSIS — Z87891 Personal history of nicotine dependence: Secondary | ICD-10-CM | POA: Insufficient documentation

## 2019-04-14 DIAGNOSIS — E1165 Type 2 diabetes mellitus with hyperglycemia: Secondary | ICD-10-CM | POA: Insufficient documentation

## 2019-04-14 DIAGNOSIS — R112 Nausea with vomiting, unspecified: Secondary | ICD-10-CM | POA: Insufficient documentation

## 2019-04-14 DIAGNOSIS — R10817 Generalized abdominal tenderness: Secondary | ICD-10-CM | POA: Insufficient documentation

## 2019-04-14 DIAGNOSIS — I10 Essential (primary) hypertension: Secondary | ICD-10-CM | POA: Insufficient documentation

## 2019-04-14 DIAGNOSIS — Z7984 Long term (current) use of oral hypoglycemic drugs: Secondary | ICD-10-CM | POA: Insufficient documentation

## 2019-04-14 DIAGNOSIS — R739 Hyperglycemia, unspecified: Secondary | ICD-10-CM

## 2019-04-14 DIAGNOSIS — F1290 Cannabis use, unspecified, uncomplicated: Secondary | ICD-10-CM | POA: Insufficient documentation

## 2019-04-14 LAB — CBC
HCT: 39.2 % (ref 39.0–52.0)
Hemoglobin: 12.7 g/dL — ABNORMAL LOW (ref 13.0–17.0)
MCH: 27.6 pg (ref 26.0–34.0)
MCHC: 32.4 g/dL (ref 30.0–36.0)
MCV: 85.2 fL (ref 80.0–100.0)
Platelets: 257 10*3/uL (ref 150–400)
RBC: 4.6 MIL/uL (ref 4.22–5.81)
RDW: 12.2 % (ref 11.5–15.5)
WBC: 6.5 10*3/uL (ref 4.0–10.5)
nRBC: 0 % (ref 0.0–0.2)

## 2019-04-14 LAB — HEPATIC FUNCTION PANEL
ALT: 27 U/L (ref 0–44)
AST: 37 U/L (ref 15–41)
Albumin: 3.3 g/dL — ABNORMAL LOW (ref 3.5–5.0)
Alkaline Phosphatase: 99 U/L (ref 38–126)
Bilirubin, Direct: 0.1 mg/dL (ref 0.0–0.2)
Indirect Bilirubin: 0.9 mg/dL (ref 0.3–0.9)
Total Bilirubin: 1 mg/dL (ref 0.3–1.2)
Total Protein: 7.2 g/dL (ref 6.5–8.1)

## 2019-04-14 LAB — BASIC METABOLIC PANEL
Anion gap: 12 (ref 5–15)
BUN: 8 mg/dL (ref 6–20)
CO2: 24 mmol/L (ref 22–32)
Calcium: 8.8 mg/dL — ABNORMAL LOW (ref 8.9–10.3)
Chloride: 96 mmol/L — ABNORMAL LOW (ref 98–111)
Creatinine, Ser: 0.95 mg/dL (ref 0.61–1.24)
GFR calc Af Amer: >60 mL/min (ref >60)
GFR calc non Af Amer: >60 mL/min (ref >60)
Glucose, Bld: 371 mg/dL — ABNORMAL HIGH (ref 70–99)
Potassium: 4.4 mmol/L (ref 3.5–5.1)
Sodium: 132 mmol/L — ABNORMAL LOW (ref 135–145)

## 2019-04-14 LAB — URINALYSIS, ROUTINE W REFLEX MICROSCOPIC
Bacteria, UA: NONE SEEN
Bilirubin Urine: NEGATIVE
Glucose, UA: >=500 mg/dL — AB
Ketones, ur: 20 mg/dL — AB
Leukocytes,Ua: NEGATIVE
Nitrite: NEGATIVE
Protein, ur: 100 mg/dL — AB
Specific Gravity, Urine: 1.035 — ABNORMAL HIGH (ref 1.005–1.030)
pH: 6 (ref 5.0–8.0)

## 2019-04-14 LAB — LIPASE, BLOOD: Lipase: 41 U/L (ref 11–51)

## 2019-04-14 LAB — CBG MONITORING, ED: Glucose-Capillary: 193 mg/dL — ABNORMAL HIGH (ref 70–99)

## 2019-04-14 MED ORDER — SODIUM CHLORIDE 0.9 % IV BOLUS
1000.0000 mL | Freq: Once | INTRAVENOUS | Status: AC
  Administered 2019-04-14: 12:00:00 1000 mL via INTRAVENOUS

## 2019-04-14 MED ORDER — INSULIN ASPART 100 UNIT/ML ~~LOC~~ SOLN
3.0000 [IU] | Freq: Once | SUBCUTANEOUS | Status: AC
  Administered 2019-04-14: 3 [IU] via INTRAVENOUS

## 2019-04-14 MED ORDER — LISINOPRIL 20 MG PO TABS: 20.0000 mg | ORAL_TABLET | Freq: Every day | ORAL | 0 refills | Status: DC

## 2019-04-14 MED ORDER — INSULIN ASPART 100 UNIT/ML ~~LOC~~ SOLN
4.0000 [IU] | Freq: Once | SUBCUTANEOUS | Status: AC
  Administered 2019-04-14: 12:00:00 4 [IU] via INTRAVENOUS

## 2019-04-14 MED ORDER — CARVEDILOL 12.5 MG PO TABS: 12.5000 mg | ORAL_TABLET | Freq: Two times a day (BID) | ORAL | 0 refills | Status: DC

## 2019-04-14 MED ORDER — LISINOPRIL 20 MG PO TABS
20.0000 mg | ORAL_TABLET | Freq: Once | ORAL | Status: AC
  Administered 2019-04-14: 12:00:00 20 mg via ORAL
  Filled 2019-04-14: qty 1

## 2019-04-14 MED ORDER — GLIPIZIDE 10 MG PO TABS: 10.0000 mg | ORAL_TABLET | Freq: Every day | ORAL | 0 refills | Status: DC

## 2019-04-14 MED FILL — CARVEDILOL 12.5 MG TABLET: 12.5 | 30 days supply | Qty: 60 | Fill #0

## 2019-04-14 MED FILL — LISINOPRIL 20 MG TABLET: 20 | 30 days supply | Qty: 30 | Fill #0

## 2019-04-14 MED FILL — glipiZIDE 10 MG TABS: 10 | 30 days supply | Qty: 30 | Fill #0

## 2019-04-14 NOTE — Discharge Planning (Signed)
ED CM consulted by EDP [place] for medication assistance. EDCM reviewed chart and spoke with the pt about [facility] MATCH program ($3 co pay for each Rx through [facility] program, does not include refills, 7 day expiration of [place] letter and choice of pharmacies). Pt is eligible for [facility] MATCH program (unable to find pt listed in PROCARE per cardholder name inquiry) and has agreed to accept [facility] with co-pay override. PROCARE information entered. [place] letter completed and provided to pt.  EDCM updated EDP and ED RN.

## 2019-04-14 NOTE — Discharge Planning (Signed)
EDCM secured Rx from [facility] pharmacy and delivered to EDRN. 

## 2019-04-14 NOTE — Discharge Instructions (Addendum)
As we discussed, it is very important for you to take your medications as directed.  We have provided you prescriptions for your medications.  Please take them accordingly.  Additionally, you have a primary care appointment scheduled for May 31, 2019.  Please make sure that you go to this primary care appointment to establish a primary care doctor.  Return the emergency department for any chest pain, difficulty breathing, numbness/weakness of your arms or legs, vomiting, abdominal pain or any other worsening concerning symptoms.

## 2019-04-14 NOTE — Discharge Planning (Signed)
[place] J. Clydene Laming, RN, BSN, [place] [phone]  [facility] set up appointment with [facility] on 1/18 @ 0930.  Spoke with pt at bedside and advised to please arrive 15 min early and take a picture ID and your current medications.  Pt verbalizes understanding of keeping appointment.

## 2019-04-14 NOTE — ED Triage Notes (Signed)
Pt to triage via [facility].  C/o hyperglycemia with nausea and vomiting since 8:30pm last night.  Headache since this morning.  Off medications x 1 month for financial reasons.

## 2019-04-14 NOTE — ED Provider Notes (Signed)
[facility] EMERGENCY DEPARTMENT Provider Note   CSN: [id number] Arrival date & time: 04/14/19  1194     History   Chief Complaint Chief Complaint  Patient presents with  . Hyperglycemia    HPI Randy [name] is a 37 y.o. male with PMH/o HTN, pancreatitis, Type II DM who presents for evaluation of hyperglycemia and hypertension.  Patient reports that for the last couple days, he has noted his sugar has been high.  He states he has not checked it.  He reports that he has been out of his blood pressure and his diabetes medications for about a month due to financial issues.  He states that last night, he started developing some generalized abdominal pain, nausea/vomiting.  He reports that vomiting is nonbloody, nonbilious.  He states he also developed a mild headache last night and states that it progressively worsened.  He has not had any chest pain, difficulty breathing, numbness/weakness of his arms or legs, vision changes.  Patient states he has not taken any of his medications in the last month.     The history is provided by the patient.    Past Medical History:  Diagnosis Date  . Hypertension   . Pancreatitis   . Type II diabetes mellitus [facility])     Patient Active Problem List   Diagnosis Date Noted  . Intractable nausea and vomiting 08/31/2015  . Hypertensive urgency 08/31/2015  . Uncontrolled diabetes mellitus ([place]) 08/31/2015  . Marijuana abuse 08/31/2015  . Noncompliance with medication regimen 08/31/2015    Past Surgical History:  Procedure Laterality Date  . NO PAST SURGERIES          Home Medications    Prior to Admission medications   Medication Sig Start Date End Date Taking? Authorizing Provider  carvedilol (COREG) 12.5 MG tablet Take 1 tablet (12.5 mg total) by mouth 2 (two) times daily with a meal. 04/14/19   [place] Lanius A, PA-C  famotidine (PEPCID) 20 MG tablet Take 1 tablet (20 mg total) by mouth 2 (two) times daily. Patient  not taking: Reported on 10/08/2018 05/22/18   Isla Pence, MD  glipiZIDE (GLUCOTROL) 10 MG tablet Take 1 tablet (10 mg total) by mouth daily before breakfast. 04/14/19   [place] Lanius A, PA-C  glucose blood (FREESTYLE TEST STRIPS) test strip Use as instructed 05/01/18   Langston Masker B, PA-C  HYDROcodone-acetaminophen (NORCO/VICODIN) 5-325 MG tablet Take 1 tablet by mouth every 6 (six) hours as needed for severe pain. 10/08/18   Ripley Fraise, MD  Lancets (FREESTYLE) lancets Use as instructed 05/01/18   Langston Masker B, PA-C  lisinopril (ZESTRIL) 20 MG tablet Take 1 tablet (20 mg total) by mouth daily. 04/14/19   Volanda Napoleon, PA-C    Family History Family History  Problem Relation Age of Onset  . Hypertension Mother   . Diabetes Father   . Hypertension Father     Social History Social History   Tobacco Use  . Smoking status: Former Smoker    Packs/day: 0.33    Years: 17.00    Pack years: 5.61    Types: Cigarettes    Quit date: 12/26/2011    Years since quitting: 7.3  . Smokeless tobacco: Never Used  Substance Use Topics  . Alcohol use: No  . Drug use: Yes    Types: Marijuana    Comment: 08/31/2015 "frequent times/day"     Allergies   Patient has no known allergies.   Review of Systems Review of  Systems  Constitutional: Negative for fever.  Eyes: Negative for visual disturbance.  Respiratory: Negative for cough and shortness of breath.   Cardiovascular: Negative for chest pain.  Gastrointestinal: Positive for abdominal pain, nausea and vomiting.  Genitourinary: Negative for dysuria and hematuria.  Neurological: Positive for headaches. Negative for weakness and numbness.  All other systems reviewed and are negative.    Physical Exam Updated Vital Signs BP (!) 175/99   Pulse 66   Temp 98.5 F (36.9 C) (Oral)   Resp 16   SpO2 100%   Physical Exam Vitals signs and nursing note reviewed.  Constitutional:      Appearance: Normal appearance. He is  well-developed.  HENT:     Head: Normocephalic and atraumatic.  Eyes:     General: Lids are normal.     Conjunctiva/sclera: Conjunctivae normal.     Pupils: Pupils are equal, round, and reactive to light.     Comments: PERRL. EOMs intact. No nystagmus. No neglect.   Neck:     Musculoskeletal: Full passive range of motion without pain.  Cardiovascular:     Rate and Rhythm: Normal rate and regular rhythm.     Pulses: Normal pulses.     Heart sounds: Normal heart sounds. No murmur. No friction rub. No gallop.   Pulmonary:     Effort: Pulmonary effort is normal.     Breath sounds: Normal breath sounds.     Comments: Lungs clear to auscultation bilaterally.  Symmetric chest rise.  No wheezing, rales, rhonchi. Abdominal:     Palpations: Abdomen is soft. Abdomen is not rigid.     Tenderness: There is abdominal tenderness. There is no guarding.     Comments: Abdomen soft, nondistended.  Generalized tenderness no focal point.  No rigidity, guarding.  Musculoskeletal: Normal range of motion.  Skin:    General: Skin is warm and dry.     Capillary Refill: Capillary refill takes less than 2 seconds.  Neurological:     Mental Status: He is alert and oriented to person, place, and time.     Comments: Cranial nerves III-XII intact Follows commands, Moves all extremities  5/5 strength to BUE and BLE  Sensation intact throughout all major nerve distributions No gait abnormalities  No slurred speech. No facial droop.   Psychiatric:        Speech: Speech normal.      ED Treatments / Results  Labs (all labs ordered are listed, but only abnormal results are displayed) Labs Reviewed  BASIC METABOLIC PANEL - Abnormal; Notable for the following components:      Result Value   Sodium 132 (*)    Chloride 96 (*)    Glucose, Bld 371 (*)    Calcium 8.8 (*)    All other components within normal limits  CBC - Abnormal; Notable for the following components:   Hemoglobin 12.7 (*)    All other  components within normal limits  URINALYSIS, ROUTINE W REFLEX MICROSCOPIC - Abnormal; Notable for the following components:   Specific Gravity, Urine 1.035 (*)    Glucose, UA >=500 (*)    Hgb urine dipstick SMALL (*)    Ketones, ur 20 (*)    Protein, ur 100 (*)    All other components within normal limits  CBG MONITORING, ED - Abnormal; Notable for the following components:   Glucose-Capillary 193 (*)    All other components within normal limits  LIPASE, BLOOD  HEPATIC FUNCTION PANEL  CBG MONITORING, ED  EKG EKG Interpretation  Date/Time:  Wednesday April 14 2019 09:37:57 EST Ventricular Rate:  69 PR Interval:  162 QRS Duration: 86 QT Interval:  392 QTC Calculation: 420 R Axis:   64 Text Interpretation: Normal sinus rhythm Normal ECG Likely BEL in V3 No STEMI, no significant changes from prior ecg Confirmed by Alvester Chou [phone]) on 04/14/2019 10:34:58 AM   Radiology No results found.  Procedures Procedures (including critical care time)  Medications Ordered in ED Medications  lisinopril (ZESTRIL) tablet 20 mg (20 mg Oral Given 04/14/19 1224)  sodium chloride 0.9 % bolus 1,000 mL (0 mLs Intravenous Stopped 04/14/19 1321)  insulin aspart (novoLOG) injection 4 Units (4 Units Intravenous Given 04/14/19 1221)  insulin aspart (novoLOG) injection 3 Units (3 Units Intravenous Given 04/14/19 1559)     Initial Impression / Assessment and Plan / ED Course  I have reviewed the triage vital signs and the nursing notes.  Pertinent labs & imaging results that were available during my care of the patient were reviewed by me and considered in my medical decision making (see chart for details).        38 year old male who presents for evaluation of hyperglycemia and hypertension.  States he has been out of his medications for about a month.  Started having some abdominal pain, nausea/vomiting last night which he states usually accompanies his high blood sugars.  No chest pain,  difficulty breathing, numbness/weakness of his arms or legs, vision change.  Does report that he has had a headache that is progressively worsened since last night.  Initially arrival, he is afebrile, nontoxic-appearing.  He is hypertensive on exam.  Likely secondary to noncompliance with medications.  No neuro deficits noted exam.  Plan for labs, fluids, p.o. medications.  BMP shows normal BUN and creatinine.  His bicarb is 24.  His anion gap is 12.  His urine shows small amount of ketones.  At this time, I suspect that his work-up is likely reflective of hyperglycemia.  I do not see any evidence of DKA in his work-up.  Patient given IV insulin.  CBC shows no evidence of leukocytosis.  Hemoglobin stable at 12.7.  We will give his oral blood pressure medications as well as dose of insulin here in the emergency department.  Blood pressure has improved slightly and is 162/109.  Patient is asymptomatic at this time.  He is sitting comfortably with no signs of distress.  His repeat blood sugar is 193 after initial insulin.  I discussed with patient regarding his medications.  He has not been able to get his medications for about a month.  Case manager consulted.  Case manager was able to obtain his medications.  I discussed with patient that he will need to establish a primary care doctor to continue getting his medications.  Patient is in agreement. At this time, patient exhibits no emergent life-threatening condition that require further evaluation in ED or admission.  Patient has been able to tolerate p.o. in the department for any difficulty.  Patient had ample opportunity for questions and discussion. All patient's questions were answered with full understanding. Strict return precautions discussed. Patient expresses understanding and agreement to plan.   Portions of this note were generated with Scientist, clinical (histocompatibility and immunogenetics). Dictation errors may occur despite best attempts at proofreading.   Final  Clinical Impressions(s) / ED Diagnoses   Final diagnoses:  Hyperglycemia  Essential hypertension    ED Discharge Orders         Ordered  carvedilol (COREG) 12.5 MG tablet  2 times daily with meals     04/14/19 1504    glipiZIDE (GLUCOTROL) 10 MG tablet  Daily before breakfast     04/14/19 1504    lisinopril (ZESTRIL) 20 MG tablet  Daily     04/14/19 1504           Rosana HoesLayden, Jahnessa Vanduyn A, PA-C 04/14/19 1622    Gerhard MunchLockwood, Robert, MD 04/16/19 1014

## 2019-05-21 ENCOUNTER — Other Ambulatory Visit: Payer: Self-pay

## 2019-05-21 ENCOUNTER — Encounter (HOSPITAL_COMMUNITY): Payer: Self-pay

## 2019-05-21 ENCOUNTER — Emergency Department (HOSPITAL_COMMUNITY)
Admission: EM | Admit: 2019-05-21 | Discharge: 2019-05-21 | Discharge status: Against Medical Advice | Payer: Self-pay | Attending: Emergency Medicine | Admitting: Emergency Medicine

## 2019-05-21 DIAGNOSIS — Z5321 Procedure and treatment not carried out due to patient leaving prior to being seen by health care provider: Secondary | ICD-10-CM | POA: Insufficient documentation

## 2019-05-21 LAB — URINALYSIS, ROUTINE W REFLEX MICROSCOPIC
Bacteria, UA: NONE SEEN
Bilirubin Urine: NEGATIVE
Glucose, UA: >=500 mg/dL — AB
Ketones, ur: NEGATIVE mg/dL
Leukocytes,Ua: NEGATIVE
Nitrite: NEGATIVE
Protein, ur: 100 mg/dL — AB
Specific Gravity, Urine: 1.03 (ref 1.005–1.030)
pH: 5 (ref 5.0–8.0)

## 2019-05-21 LAB — CBC
HCT: 37.7 % — ABNORMAL LOW (ref 39.0–52.0)
Hemoglobin: 12.3 g/dL — ABNORMAL LOW (ref 13.0–17.0)
MCH: 27.6 pg (ref 26.0–34.0)
MCHC: 32.6 g/dL (ref 30.0–36.0)
MCV: 84.7 fL (ref 80.0–100.0)
Platelets: 315 10*3/uL (ref 150–400)
RBC: 4.45 MIL/uL (ref 4.22–5.81)
RDW: 11.9 % (ref 11.5–15.5)
WBC: 6.2 10*3/uL (ref 4.0–10.5)
nRBC: 0 % (ref 0.0–0.2)

## 2019-05-21 LAB — BASIC METABOLIC PANEL
Anion gap: 9 (ref 5–15)
BUN: 15 mg/dL (ref 6–20)
CO2: 26 mmol/L (ref 22–32)
Calcium: 8.6 mg/dL — ABNORMAL LOW (ref 8.9–10.3)
Chloride: 92 mmol/L — ABNORMAL LOW (ref 98–111)
Creatinine, Ser: 0.95 mg/dL (ref 0.61–1.24)
GFR calc Af Amer: >60 mL/min (ref >60)
GFR calc non Af Amer: >60 mL/min (ref >60)
Glucose, Bld: 427 mg/dL — ABNORMAL HIGH (ref 70–99)
Potassium: 3.8 mmol/L (ref 3.5–5.1)
Sodium: 127 mmol/L — ABNORMAL LOW (ref 135–145)

## 2019-05-21 LAB — CBG MONITORING, ED: Glucose-Capillary: 382 mg/dL — ABNORMAL HIGH (ref 70–99)

## 2019-05-21 NOTE — ED Triage Notes (Signed)
Pt reports back pain and abd pain for the past few days, states this is usually how he feels when his blood sugar is high. CBG 427 with EMS. Pt recently had his diabetes medications changed. Pt a.o, denies n.v

## 2019-05-21 NOTE — ED Notes (Signed)
Pt called for VS recheck x3, no response. 

## 2019-05-21 NOTE — ED Notes (Signed)
Pt called for VS recheck x2, no response.  

## 2019-05-21 NOTE — ED Notes (Signed)
Pt called for vitlal recheck no answer in lobby

## 2019-05-23 ENCOUNTER — Emergency Department (HOSPITAL_COMMUNITY)
Admission: EM | Admit: 2019-05-23 | Discharge: 2019-05-23 | Disposition: A | Discharge status: Home / Self Care | Payer: Self-pay | Attending: Emergency Medicine | Admitting: Emergency Medicine

## 2019-05-23 ENCOUNTER — Other Ambulatory Visit: Payer: Self-pay

## 2019-05-23 ENCOUNTER — Encounter (HOSPITAL_COMMUNITY): Payer: Self-pay | Admitting: Emergency Medicine

## 2019-05-23 DIAGNOSIS — Z87891 Personal history of nicotine dependence: Secondary | ICD-10-CM | POA: Insufficient documentation

## 2019-05-23 DIAGNOSIS — I10 Essential (primary) hypertension: Secondary | ICD-10-CM | POA: Insufficient documentation

## 2019-05-23 DIAGNOSIS — K859 Acute pancreatitis without necrosis or infection, unspecified: Secondary | ICD-10-CM | POA: Insufficient documentation

## 2019-05-23 DIAGNOSIS — E119 Type 2 diabetes mellitus without complications: Secondary | ICD-10-CM | POA: Insufficient documentation

## 2019-05-23 DIAGNOSIS — Z7984 Long term (current) use of oral hypoglycemic drugs: Secondary | ICD-10-CM | POA: Insufficient documentation

## 2019-05-23 LAB — COMPREHENSIVE METABOLIC PANEL
ALT: 17 U/L (ref 0–44)
AST: 19 U/L (ref 15–41)
Albumin: 3.1 g/dL — ABNORMAL LOW (ref 3.5–5.0)
Alkaline Phosphatase: 101 U/L (ref 38–126)
Anion gap: 6 (ref 5–15)
BUN: 11 mg/dL (ref 6–20)
CO2: 27 mmol/L (ref 22–32)
Calcium: 8.8 mg/dL — ABNORMAL LOW (ref 8.9–10.3)
Chloride: 98 mmol/L (ref 98–111)
Creatinine, Ser: 0.96 mg/dL (ref 0.61–1.24)
GFR calc Af Amer: >60 mL/min (ref >60)
GFR calc non Af Amer: >60 mL/min (ref >60)
Glucose, Bld: 347 mg/dL — ABNORMAL HIGH (ref 70–99)
Potassium: 4.3 mmol/L (ref 3.5–5.1)
Sodium: 131 mmol/L — ABNORMAL LOW (ref 135–145)
Total Bilirubin: 0.9 mg/dL (ref 0.3–1.2)
Total Protein: 6.5 g/dL (ref 6.5–8.1)

## 2019-05-23 LAB — CBC WITH DIFFERENTIAL/PLATELET
Abs Immature Granulocytes: 0.02 10*3/uL (ref 0.00–0.07)
Basophils Absolute: 0.1 10*3/uL (ref 0.0–0.1)
Basophils Relative: 1 %
Eosinophils Absolute: 0.1 10*3/uL (ref 0.0–0.5)
Eosinophils Relative: 1 %
HCT: 37.5 % — ABNORMAL LOW (ref 39.0–52.0)
Hemoglobin: 12 g/dL — ABNORMAL LOW (ref 13.0–17.0)
Immature Granulocytes: 0 %
Lymphocytes Relative: 25 %
Lymphs Abs: 1.7 10*3/uL (ref 0.7–4.0)
MCH: 27.4 pg (ref 26.0–34.0)
MCHC: 32 g/dL (ref 30.0–36.0)
MCV: 85.6 fL (ref 80.0–100.0)
Monocytes Absolute: 0.5 10*3/uL (ref 0.1–1.0)
Monocytes Relative: 7 %
Neutro Abs: 4.5 10*3/uL (ref 1.7–7.7)
Neutrophils Relative %: 66 %
Platelets: 281 10*3/uL (ref 150–400)
RBC: 4.38 MIL/uL (ref 4.22–5.81)
RDW: 12 % (ref 11.5–15.5)
WBC: 6.9 10*3/uL (ref 4.0–10.5)
nRBC: 0 % (ref 0.0–0.2)

## 2019-05-23 LAB — LIPASE, BLOOD: Lipase: 176 U/L — ABNORMAL HIGH (ref 11–51)

## 2019-05-23 MED ORDER — HYDROCODONE-ACETAMINOPHEN 5-325 MG PO TABS: 2.0000 | ORAL_TABLET | ORAL | 0 refills | Status: DC | PRN

## 2019-05-23 MED ORDER — HYDROCODONE-ACETAMINOPHEN 5-325 MG PO TABS
1.0000 | ORAL_TABLET | Freq: Once | ORAL | Status: AC
  Administered 2019-05-23: 09:00:00 1 via ORAL
  Filled 2019-05-23: qty 1

## 2019-05-23 MED ORDER — SODIUM CHLORIDE 0.9 % IV SOLN: Freq: Once | INTRAVENOUS | Status: AC

## 2019-05-23 MED ORDER — ONDANSETRON 4 MG PO TBDP: 4.0000 mg | ORAL_TABLET | Freq: Three times a day (TID) | ORAL | 0 refills | Status: DC | PRN

## 2019-05-23 MED ORDER — ONDANSETRON HCL 4 MG/2ML IJ SOLN
4.0000 mg | Freq: Once | INTRAMUSCULAR | Status: AC
  Administered 2019-05-23: 09:00:00 4 mg via INTRAVENOUS
  Filled 2019-05-23: qty 2

## 2019-05-23 NOTE — Discharge Instructions (Signed)
Follow up with [place] [facility]

## 2019-05-23 NOTE — ED Provider Notes (Signed)
[facility] EMERGENCY DEPARTMENT Provider Note   CSN: [id number] Arrival date & time: 05/23/19  6606     History Chief Complaint  Patient presents with  . Back Pain    Randy [name] is a 39 y.o. male.  HPI Patient is a 39 year old male presented today for back pain in his lower back.  States that it is achy, worse with movement, similar to prior back pains in the past.  Patient states that he also has some mild epigastric discomfort with some associated nausea that began this morning and vomiting x2 was nonbloody nonbilious.  States that he has a history of pancreatitis last episode was 4 years ago which was approximately when he has last alcoholic drink.  Denies any diarrhea.  States he is otherwise feeling quite well.  Inquires about referral to [facility] health and wellness clinic.  He takes glipizide for his diabetes but states that he is not well controlled.  Patient denies any chest pain or shortness of breath.  Denies any diaphoresis.  Patient denies any history of peptic ulcer disease.  States that he does not take frequent NSAIDs and denies any alcohol use in the past 4 years.  Patient also denies any urinary symptoms or changes with his bowel movements.  Denies any history of IV drug use, any weakness or numbness in his lower extremities.  Any saddle anesthesia, fevers or urinary or bowel incontinence or hesitancy.    Past Medical History:  Diagnosis Date  . Hypertension   . Pancreatitis   . Type II diabetes mellitus [facility])     Patient Active Problem List   Diagnosis Date Noted  . Intractable nausea and vomiting 08/31/2015  . Hypertensive urgency 08/31/2015  . Uncontrolled diabetes mellitus (HCC) 08/31/2015  . Marijuana abuse 08/31/2015  . Noncompliance with medication regimen 08/31/2015    Past Surgical History:  Procedure Laterality Date  . NO PAST SURGERIES         Family History  Problem Relation Age of Onset  . Hypertension Mother   .  Diabetes Father   . Hypertension Father     Social History   Tobacco Use  . Smoking status: Former Smoker    Packs/day: 0.33    Years: 17.00    Pack years: 5.61    Types: Cigarettes    Quit date: 12/26/2011    Years since quitting: 7.4  . Smokeless tobacco: Never Used  Substance Use Topics  . Alcohol use: No  . Drug use: Yes    Types: Marijuana    Comment: 08/31/2015 "frequent times/day"    Home Medications Prior to Admission medications   Medication Sig Start Date End Date Taking? Authorizing Provider  carvedilol (COREG) 12.5 MG tablet Take 1 tablet (12.5 mg total) by mouth 2 (two) times daily with a meal. 04/14/19   Graciella Freer A, PA-C  famotidine (PEPCID) 20 MG tablet Take 1 tablet (20 mg total) by mouth 2 (two) times daily. Patient not taking: Reported on 10/08/2018 05/22/18   Jacalyn Lefevre, MD  glipiZIDE (GLUCOTROL) 10 MG tablet Take 1 tablet (10 mg total) by mouth daily before breakfast. 04/14/19   Graciella Freer A, PA-C  glucose blood (FREESTYLE TEST STRIPS) test strip Use as instructed 05/01/18   Aviva Kluver B, PA-C  HYDROcodone-acetaminophen (NORCO/VICODIN) 5-325 MG tablet Take 1 tablet by mouth every 6 (six) hours as needed for severe pain. 10/08/18   Zadie Rhine, MD  Lancets (FREESTYLE) lancets Use as instructed 05/01/18   Aviva Kluver  B, PA-C  lisinopril (ZESTRIL) 20 MG tablet Take 1 tablet (20 mg total) by mouth daily. 04/14/19   Maxwell Caul, PA-C    Allergies    Patient has no known allergies.  Review of Systems   Review of Systems  Constitutional: Negative for chills and fever.  HENT: Negative for congestion.   Eyes: Negative for pain.  Respiratory: Negative for cough and shortness of breath.   Cardiovascular: Negative for chest pain and leg swelling.  Gastrointestinal: Positive for abdominal pain, nausea and vomiting.  Genitourinary: Negative for dysuria.  Musculoskeletal: Positive for back pain. Negative for myalgias.  Skin: Negative for  rash.  Neurological: Negative for dizziness and headaches.    Physical Exam Updated Vital Signs BP (!) 148/105 (BP Location: Left Arm)   Pulse 76   Temp 98.2 F (36.8 C) (Oral)   Resp 16   SpO2 99%   Physical Exam Vitals and nursing note reviewed.  Constitutional:      General: He is not in acute distress. HENT:     Head: Normocephalic and atraumatic.     Nose: Nose normal.     Mouth/Throat:     Mouth: Mucous membranes are moist.  Eyes:     General: No scleral icterus. Cardiovascular:     Rate and Rhythm: Normal rate and regular rhythm.     Pulses: Normal pulses.     Heart sounds: Normal heart sounds.  Pulmonary:     Effort: Pulmonary effort is normal. No respiratory distress.     Breath sounds: No wheezing.  Abdominal:     Palpations: Abdomen is soft.     Tenderness: There is no abdominal tenderness. There is no right CVA tenderness, left CVA tenderness, guarding or rebound.     Comments: Benign abdominal exam, does not appear uncomfortable with epigastric palpation or percussion  Musculoskeletal:       Arms:     Cervical back: Normal range of motion and neck supple. No tenderness.     Right lower leg: No edema.     Left lower leg: No edema.     Comments: Reproducible paralumbar muscular tenderness.  No midline tenderness.  No step-off deformity or pain with percussion of vertebra.  Skin:    General: Skin is warm and dry.     Capillary Refill: Capillary refill takes less than 2 seconds.  Neurological:     Mental Status: He is alert. Mental status is at baseline.  Psychiatric:        Mood and Affect: Mood normal.        Behavior: Behavior normal.     ED Results / Procedures / Treatments   Labs (all labs ordered are listed, but only abnormal results are displayed) Labs Reviewed  COMPREHENSIVE METABOLIC PANEL  CBC WITH DIFFERENTIAL/PLATELET  LIPASE, BLOOD    EKG None  Radiology No results found.  Procedures Procedures (including critical care  time)  Medications Ordered in ED Medications  ondansetron (ZOFRAN) injection 4 mg (has no administration in time range)  HYDROcodone-acetaminophen (NORCO/VICODIN) 5-325 MG per tablet 1 tablet (has no administration in time range)  0.9 %  sodium chloride infusion (has no administration in time range)    ED Course  I have reviewed the triage vital signs and the nursing notes.  Pertinent labs & imaging results that were available during my care of the patient were reviewed by me and considered in my medical decision making (see chart for details).  Clinical Course as of May 22 1153  Sun May 23, 2019  1610 EKG ordered to evaluate as patient has epigastric pain doubt that this is ACS it is nonexertional and without historical red flags for ACS.  EKG personally reviewed myself is nonischemic.  ED EKG [WF]    Clinical Course User Index [WF] Gailen Shelter, [place]   MDM Rules/Calculators/A&P                      Patient is a 39 year old male with a history of pancreatitis and poorly controlled diabetes presented today with back pain that is lumbar and reproducible as well as epigastric abdominal pain that does not radiate into his back but appears to be a separate area of pain.  Patient has a history of pancreatitis high suspicion for acute on chronic pancreatitis however considered AAA this is unlikely as patient is hemodynamically stable has no palpable mass in abdomen and has symmetric upper and lower extremity pulses.  Doubt epigastric pain is ACS.  Patient has no diaphoresis, chest pain, jaw or arm pain.  Denies any exertional component of pain.  Also low suspicion for pulmonary embolism.  Patient is PERC negative.  Also on the differential is diabetes related gastroparesis as patient is poorly controlled diabetes per his history.  Peptic ulcer disease and gastritis also on my differential.  Will obtain routine lab work to evaluate electrolytes and lipase as well as blood sugar.  Will write  patient with Zofran and 1 L NS as well as p.o. challenge.  Patient given 1 Norco for pain control.  I discussed this case with my attending physician who cosigned this note including patient's presenting symptoms, physical exam, and planned diagnostics and interventions. Attending physician stated agreement with plan or made changes to plan which were implemented.   On repeat exam patient is much improved states he is pain-free and has no epigastric tenderness at this time.  He has been tolerating p.o.  Lipase is mildly elevated at 176 suspect that this is mild acute pancreatitis.  He has no white count or significant pain or elevated temperature or tachycardia to suggest complication of this.  His glucose is elevated as well is chronically elevated.  He is on glipizide understand that he needs to be titrated up on his diabetes regimen.  He will follow-up with [place] and wellness clinic.  States he will call tomorrow.  He will return to ED if he has any new or concerning symptoms.  Discharged with Zofran for nausea and 6 tablets of Norco.    Final Clinical Impression(s) / ED Diagnoses Final diagnoses:  None   Randy [name] was evaluated in Emergency Department on 05/23/2019 for the symptoms described in the history of present illness. He was evaluated in the context of the global COVID-19 pandemic, which necessitated consideration that the patient might be at risk for infection with the SARS-CoV-2 virus that causes COVID-19. Institutional protocols and algorithms that pertain to the evaluation of patients at risk for COVID-19 are in a state of rapid change based on information released by regulatory bodies including the CDC and federal and state organizations. These policies and algorithms were followed during the patient's care in the ED.  The medical records were personally reviewed by myself. I personally reviewed all lab results and interpreted all imaging studies and either concurred with  their official read or contacted radiology for clarification.   This patient appears reasonably screened and I doubt any other medical condition requiring further workup, evaluation,  or treatment in the ED at this time prior to discharge.   Patient's vitals are WNL apart from vital sign abnormalities discussed above, patient is in NAD, and able to ambulate in the ED at their baseline and able to tolerate PO.  Pain has been managed or a plan has been made for home management and has no complaints prior to discharge. Patient is comfortable with above plan and for discharge at this time. All questions were answered prior to disposition. Results from the ER workup discussed with the patient face to face and all questions answered to the best of my ability. The patient is safe for discharge with strict return precautions. Patient appears safe for discharge with appropriate follow-up. Conveyed my impression with the patient and they voiced understanding and are agreeable to plan.   An After Visit Summary was printed and given to the patient.  Portions of this note were generated with [employer]. Dictation errors may occur despite best attempts at proofreading.     Rx / DC Orders ED Discharge Orders    None       Tedd Sias, [place] 05/23/19 1157    Lucrezia Starch, MD 05/24/19 [phone]

## 2019-05-23 NOTE — ED Triage Notes (Signed)
Patient reports chronic back pain for several years worse this week unrelieved by [facility] pain cream denies injury / no urinary discomfort .

## 2019-05-23 NOTE — ED Notes (Signed)
Patient verbalizes understanding of discharge instructions. Opportunity for questioning and answers were provided. Armband removed by staff, pt discharged from ED.  Ambulatory by self   

## 2019-05-31 ENCOUNTER — Encounter: Payer: Self-pay | Admitting: Internal Medicine

## 2019-05-31 ENCOUNTER — Other Ambulatory Visit: Payer: Self-pay

## 2019-05-31 ENCOUNTER — Ambulatory Visit (INDEPENDENT_AMBULATORY_CARE_PROVIDER_SITE_OTHER): Payer: Self-pay | Admitting: Internal Medicine

## 2019-05-31 VITALS — BP 150/89 | HR 71 | Temp 97.3°F | Resp 17 | Ht 67.0 in | Wt 193.6 lb

## 2019-05-31 DIAGNOSIS — Z7689 Persons encountering health services in other specified circumstances: Secondary | ICD-10-CM

## 2019-05-31 DIAGNOSIS — E119 Type 2 diabetes mellitus without complications: Secondary | ICD-10-CM

## 2019-05-31 DIAGNOSIS — I10 Essential (primary) hypertension: Secondary | ICD-10-CM | POA: Insufficient documentation

## 2019-05-31 DIAGNOSIS — Z13228 Encounter for screening for other metabolic disorders: Secondary | ICD-10-CM

## 2019-05-31 HISTORY — DX: Essential (primary) hypertension: I10

## 2019-05-31 LAB — POCT GLYCOSYLATED HEMOGLOBIN (HGB A1C): HbA1c POC (<> result, manual entry): 12.8 % (ref 4.0–5.6)

## 2019-05-31 LAB — GLUCOSE, POCT (MANUAL RESULT ENTRY): POC Glucose: 318 mg/dl — AB (ref 70–99)

## 2019-05-31 MED ORDER — LISINOPRIL 40 MG PO TABS: 40.0000 mg | ORAL_TABLET | Freq: Every day | ORAL | 3 refills | Status: DC

## 2019-05-31 NOTE — Progress Notes (Signed)
Subjective:    Randy [name] - 39 y.o. male MRN [id number]  Date of birth: Jan 25, 1981  HPI  Randy [name] is to establish care. Patient has a PMH significant for HTN and T2DM.   Chronic HTN Disease Monitoring:  Home BP Monitoring - Needs to get a cuff.  Chest pain- no  Dyspnea- no Headache - no  Medications: On Coreg 12.5 mg BID , Lisinopril 20 mg daily  Compliance- yesLightheadedness- no  Edema- no    Diabetes mellitus, Type 2 Disease Monitoring Blood Sugar Ranges: Does not check. Needs diabetic supplies.  Polyuria: No  Visual problems: yes, reports he can't read things far away; has worn glasses in the past     Urine Microalbumin Pending, on Ace Inhibitor therapy   Last A1C: 11.7 (03/2018)    Medication Compliance: yes, with Glipizide 10 mg. Has been on insulin in the past--up until about 8 months ago. Reports was unable to tolerate Metformin due to GI side effects.  Medication Side Effects Hypoglycemia: no   Preventitive Health Care Eye Exam: Needs  Foot Exam: Performed today    Social History   reports that he quit smoking about 7 years ago. His smoking use included cigarettes. He has a 5.61 pack-year smoking history. He has never used smokeless tobacco. He reports current drug use. Drug: Marijuana. He reports that he does not drink alcohol.   Family History  family history includes Diabetes in his father; Hypertension in his father and mother.    Health Maintenance Due  Topic Date Due  . PNEUMOCOCCAL POLYSACCHARIDE VACCINE AGE 60-64 HIGH RISK  06/03/1981  . OPHTHALMOLOGY EXAM  06/03/1989  . HIV Screening  06/03/1994  . HEMOGLOBIN A1C  03/01/2016  . INFLUENZA VACCINE  12/12/2018     Review of Systems  Constitutional: Negative for chills, fever, malaise/fatigue and weight loss.  HENT: Negative for congestion, ear pain and sore throat.   Eyes: Negative for blurred  vision, discharge and redness.  Respiratory: Negative for cough, shortness of breath and wheezing.   Cardiovascular: Negative for chest pain, palpitations and leg swelling.  Gastrointestinal: Negative for abdominal pain, constipation, diarrhea, nausea and vomiting.  Genitourinary: Negative for dysuria, frequency, hematuria and urgency.  Musculoskeletal: Negative for back pain and myalgias.  Skin: Negative for rash.  Neurological: Negative for dizziness, weakness and headaches.  Psychiatric/Behavioral: Negative for depression and suicidal ideas. The patient is not nervous/anxious.   All other systems reviewed and are negative.     Past Medical History: Patient Active Problem List   Diagnosis Date Noted  . Essential hypertension 05/31/2019  . Intractable nausea and vomiting 08/31/2015  . Hypertensive urgency 08/31/2015  . Uncontrolled diabetes mellitus (HCC) 08/31/2015  . Marijuana abuse 08/31/2015  . Noncompliance with medication regimen 08/31/2015    Medications: reviewed and updated   Objective:   Physical Exam BP (!) 150/89   Pulse 71   Temp (!) 97.3 F (36.3 C) (Temporal)   Resp 17   Ht 5\' 7"  (1.702 m)   Wt 193 lb 9.6 oz (87.8 kg)   SpO2 98%   BMI 30.32 kg/m  Physical Exam  Constitutional: He is oriented to person, place, and time and well-developed, well-nourished, and in no distress. No distress.  HENT:  Head: Normocephalic and atraumatic.  Eyes: Conjunctivae and EOM are normal.  Cardiovascular: Normal rate, regular rhythm and normal heart sounds.  No murmur heard. Pulmonary/Chest: Effort normal and breath sounds normal. No respiratory distress.  Musculoskeletal:  General: Normal range of motion.     Comments: Diabetic Foot Check -  Appearance - no lesions, ulcers or calluses Skin - no unusual pallor or redness Monofilament testing - normal bilaterally  Right - Great toe, medial, central, lateral ball and posterior foot intact Left - Great toe, medial,  central, lateral ball and posterior foot intact  Neurological: He is alert and oriented to person, place, and time.  Skin: Skin is warm and dry. He is not diaphoretic.  Psychiatric: Affect and judgment normal.        Assessment & Plan:   1. Encounter to establish care - HIV antibody (with reflex)  2. Type 2 diabetes mellitus without complication, without long-term current use of insulin (HCC) Glucose 318 (fasting). HgB A1c 12.8. Discussed with patient that warrants restarting insulin therapy. Barriers will be lack of insurance and cost. He does not have any diabetic supplies at present either. Will have patient see pharmacist at [facility] for consult. Would appreciate if Lurena Joiner would start patient on insulin and assist with obtaining supplies/review how to use them. Counseled on Diabetic diet, my plate method, 165 minutes of moderate intensity exercise/week. Advised on the need for annual eye exams, annual foot exams, Pneumonia vaccine. - Microalbumin/Creatinine Ratio, Urine - Glucose (CBG) - HgB A1c - Ambulatory referral to Ophthalmology - lisinopril (ZESTRIL) 40 MG tablet; Take 1 tablet (40 mg total) by mouth daily.  Dispense: 90 tablet; Refill: 3  3. Essential hypertension BP 150/89 today. Was elevated to 148/105 in ER a week ago. Will increase Lisinopril to 40 mg daily. Likely will need a third agent for optimal control. Return in 1-2 weeks to check BMET with increase in dose and for a BP check.  Counseled on blood pressure goal of less than 130/80, low-sodium, DASH diet, medication compliance, 150 minutes of moderate intensity exercise per week. Discussed medication compliance, adverse effects. - lisinopril (ZESTRIL) 40 MG tablet; Take 1 tablet (40 mg total) by mouth daily.  Dispense: 90 tablet; Refill: 3 - Basic Metabolic Panel; Future  4. Screening for metabolic disorder Suspect patient would benefit from statin therapy given multiple risk factors for CVD. Will plan to calculate ASCVD  risk with results.  - Lipid Panel    Phill Myron, D.O. 05/31/2019, 9:36 AM Primary Care at [facility]

## 2019-05-31 NOTE — Patient Instructions (Addendum)
Please make an appointment with Franky Macho the pharmacist at [facility].   Increase Lisinopril to 40 mg daily. Take two of the tablets you have now until you run out of those.   Return in two weeks for blood pressure check and lab draw.

## 2019-06-01 ENCOUNTER — Other Ambulatory Visit: Payer: Self-pay | Admitting: Internal Medicine

## 2019-06-01 ENCOUNTER — Encounter: Payer: Self-pay | Admitting: Internal Medicine

## 2019-06-01 DIAGNOSIS — E1129 Type 2 diabetes mellitus with other diabetic kidney complication: Secondary | ICD-10-CM | POA: Insufficient documentation

## 2019-06-01 DIAGNOSIS — E1169 Type 2 diabetes mellitus with other specified complication: Secondary | ICD-10-CM | POA: Insufficient documentation

## 2019-06-01 DIAGNOSIS — E785 Hyperlipidemia, unspecified: Secondary | ICD-10-CM | POA: Insufficient documentation

## 2019-06-01 LAB — MICROALBUMIN / CREATININE URINE RATIO
Creatinine, Urine: 84.4 mg/dL
Microalb/Creat Ratio: 828 mg/g creat — ABNORMAL HIGH (ref 0–29)
Microalbumin, Urine: 698.5 ug/mL

## 2019-06-01 LAB — LIPID PANEL
Chol/HDL Ratio: 8.5 ratio — ABNORMAL HIGH (ref 0.0–5.0)
Cholesterol, Total: 322 mg/dL — ABNORMAL HIGH (ref 100–199)
HDL: 38 mg/dL — ABNORMAL LOW (ref >39)
LDL Chol Calc (NIH): 196 mg/dL — ABNORMAL HIGH (ref 0–99)
Triglycerides: 424 mg/dL — ABNORMAL HIGH (ref 0–149)
VLDL Cholesterol Cal: 88 mg/dL — ABNORMAL HIGH (ref 5–40)

## 2019-06-01 LAB — HIV ANTIBODY (ROUTINE TESTING W REFLEX): HIV Screen 4th Generation wRfx: NONREACTIVE

## 2019-06-01 MED ORDER — ATORVASTATIN CALCIUM 80 MG PO TABS: 80.0000 mg | ORAL_TABLET | Freq: Every day | ORAL | 3 refills | Status: DC

## 2019-06-01 NOTE — Progress Notes (Signed)
Lipitor Rx sent.

## 2019-06-04 ENCOUNTER — Ambulatory Visit: Payer: Self-pay | Admitting: Pharmacist

## 2019-06-07 ENCOUNTER — Telehealth: Payer: Self-pay | Admitting: Internal Medicine

## 2019-06-07 NOTE — Telephone Encounter (Signed)
Pt called / Message given to the patient to see Franky Macho as per MD instructions."Please call Byran Wipperfurth about microalbumin".. Made aware of scheduled appt. Verbalized understanding

## 2019-06-07 NOTE — Telephone Encounter (Signed)
Pt called for labs  

## 2019-06-08 ENCOUNTER — Other Ambulatory Visit: Payer: Self-pay

## 2019-06-08 ENCOUNTER — Ambulatory Visit: Payer: Self-pay | Admitting: Pharmacist

## 2019-06-08 ENCOUNTER — Encounter (HOSPITAL_COMMUNITY): Payer: Self-pay | Admitting: Emergency Medicine

## 2019-06-08 ENCOUNTER — Emergency Department (HOSPITAL_COMMUNITY): Payer: Self-pay

## 2019-06-08 ENCOUNTER — Emergency Department (HOSPITAL_COMMUNITY)
Admission: EM | Admit: 2019-06-08 | Discharge: 2019-06-09 | Disposition: A | Discharge status: Home / Self Care | Payer: Self-pay | Attending: Emergency Medicine | Admitting: Emergency Medicine

## 2019-06-08 DIAGNOSIS — F1210 Cannabis abuse, uncomplicated: Secondary | ICD-10-CM | POA: Insufficient documentation

## 2019-06-08 DIAGNOSIS — Z7984 Long term (current) use of oral hypoglycemic drugs: Secondary | ICD-10-CM | POA: Insufficient documentation

## 2019-06-08 DIAGNOSIS — R1013 Epigastric pain: Secondary | ICD-10-CM | POA: Insufficient documentation

## 2019-06-08 DIAGNOSIS — Z87891 Personal history of nicotine dependence: Secondary | ICD-10-CM | POA: Insufficient documentation

## 2019-06-08 DIAGNOSIS — E119 Type 2 diabetes mellitus without complications: Secondary | ICD-10-CM | POA: Insufficient documentation

## 2019-06-08 DIAGNOSIS — Z79899 Other long term (current) drug therapy: Secondary | ICD-10-CM | POA: Insufficient documentation

## 2019-06-08 DIAGNOSIS — R112 Nausea with vomiting, unspecified: Secondary | ICD-10-CM | POA: Insufficient documentation

## 2019-06-08 DIAGNOSIS — R748 Abnormal levels of other serum enzymes: Secondary | ICD-10-CM | POA: Insufficient documentation

## 2019-06-08 DIAGNOSIS — I10 Essential (primary) hypertension: Secondary | ICD-10-CM | POA: Insufficient documentation

## 2019-06-08 LAB — CBC
HCT: 39.9 % (ref 39.0–52.0)
Hemoglobin: 12.8 g/dL — ABNORMAL LOW (ref 13.0–17.0)
MCH: 26.8 pg (ref 26.0–34.0)
MCHC: 32.1 g/dL (ref 30.0–36.0)
MCV: 83.5 fL (ref 80.0–100.0)
Platelets: 303 10*3/uL (ref 150–400)
RBC: 4.78 MIL/uL (ref 4.22–5.81)
RDW: 11.9 % (ref 11.5–15.5)
WBC: 7.2 10*3/uL (ref 4.0–10.5)
nRBC: 0 % (ref 0.0–0.2)

## 2019-06-08 LAB — BASIC METABOLIC PANEL
Anion gap: 10 (ref 5–15)
BUN: 12 mg/dL (ref 6–20)
CO2: 26 mmol/L (ref 22–32)
Calcium: 8.7 mg/dL — ABNORMAL LOW (ref 8.9–10.3)
Chloride: 94 mmol/L — ABNORMAL LOW (ref 98–111)
Creatinine, Ser: 1.04 mg/dL (ref 0.61–1.24)
GFR calc Af Amer: >60 mL/min (ref >60)
GFR calc non Af Amer: >60 mL/min (ref >60)
Glucose, Bld: 283 mg/dL — ABNORMAL HIGH (ref 70–99)
Potassium: 3.8 mmol/L (ref 3.5–5.1)
Sodium: 130 mmol/L — ABNORMAL LOW (ref 135–145)

## 2019-06-08 LAB — CBG MONITORING, ED: Glucose-Capillary: 266 mg/dL — ABNORMAL HIGH (ref 70–99)

## 2019-06-08 LAB — TROPONIN I (HIGH SENSITIVITY): Troponin I (High Sensitivity): 22 ng/L — ABNORMAL HIGH (ref <18)

## 2019-06-08 MED ORDER — SODIUM CHLORIDE 0.9 % IV BOLUS
1000.0000 mL | Freq: Once | INTRAVENOUS | Status: AC
  Administered 2019-06-08: 1000 mL via INTRAVENOUS

## 2019-06-08 MED ORDER — SODIUM CHLORIDE 0.9% FLUSH
3.0000 mL | Freq: Once | INTRAVENOUS | Status: AC
  Administered 2019-06-08: 3 mL via INTRAVENOUS

## 2019-06-08 MED ORDER — METOCLOPRAMIDE HCL 5 MG/ML IJ SOLN
10.0000 mg | Freq: Once | INTRAMUSCULAR | Status: AC
  Administered 2019-06-08: 10 mg via INTRAVENOUS
  Filled 2019-06-08: qty 2

## 2019-06-08 NOTE — ED Provider Notes (Signed)
[facility] EMERGENCY DEPARTMENT Provider Note  CSN: [id number] Arrival date & time: 06/08/19 2015  Chief Complaint(s) Chest Pain and Abdominal Pain  HPI Randy [name] is a 39 y.o. male    Abdominal Pain Pain location:  Epigastric Pain quality: cramping and stabbing   Pain radiates to:  Chest and back Pain severity:  Moderate Onset quality:  Gradual Duration:  2 days Timing:  Constant Progression:  Waxing and waning Chronicity:  Recurrent Context: eating   Context: not alcohol use, not recent illness, not sick contacts and not suspicious food intake   Relieved by:  Nothing (hot shower/bath) Worsened by:  Vomiting (eating) Associated symptoms: nausea and vomiting   Associated symptoms: no diarrhea and no shortness of breath    Stopped smoking marijuana last week.   Past Medical History Past Medical History:  Diagnosis Date  . Hypertension   . Pancreatitis   . Type II diabetes mellitus [facility])    Patient Active Problem List   Diagnosis Date Noted  . Hyperlipidemia 06/01/2019  . Microalbuminuria due to type 2 diabetes mellitus (HCC) 06/01/2019  . Essential hypertension 05/31/2019  . Intractable nausea and vomiting 08/31/2015  . Hypertensive urgency 08/31/2015  . Uncontrolled diabetes mellitus (HCC) 08/31/2015  . Marijuana abuse 08/31/2015  . Noncompliance with medication regimen 08/31/2015   Home Medication(s) Prior to Admission medications   Medication Sig Start Date End Date Taking? Authorizing Provider  atorvastatin (LIPITOR) 80 MG tablet Take 1 tablet (80 mg total) by mouth daily. 06/01/19  Yes Arvilla Market, DO  carvedilol (COREG) 12.5 MG tablet Take 1 tablet (12.5 mg total) by mouth 2 (two) times daily with a meal. 04/14/19  Yes Graciella Freer A, PA-C  glipiZIDE (GLUCOTROL) 10 MG tablet Take 1 tablet (10 mg total) by mouth daily before breakfast. 04/14/19  Yes Graciella Freer A, PA-C  lisinopril (ZESTRIL) 40 MG tablet Take 1 tablet  (40 mg total) by mouth daily. 05/31/19  Yes Arvilla Market, DO  ondansetron (ZOFRAN ODT) 4 MG disintegrating tablet Take 1 tablet (4 mg total) by mouth every 8 (eight) hours as needed for nausea or vomiting. 06/09/19   Ruthanna Macchia, Amadeo Garnet, MD                                                                                                                                    Past Surgical History Past Surgical History:  Procedure Laterality Date  . NO PAST SURGERIES     Family History Family History  Problem Relation Age of Onset  . Hypertension Mother   . Diabetes Father   . Hypertension Father     Social History Social History   Tobacco Use  . Smoking status: Former Smoker    Packs/day: 0.33    Years: 17.00    Pack years: 5.61    Types: Cigarettes    Quit date: 12/26/2011    Years since quitting: 7.4  .  Smokeless tobacco: Never Used  Substance Use Topics  . Alcohol use: No  . Drug use: Yes    Types: Marijuana    Comment: 08/31/2015 "frequent times/day"   Allergies Patient has no known allergies.  Review of Systems Review of Systems  Respiratory: Negative for shortness of breath.   Gastrointestinal: Positive for abdominal pain, nausea and vomiting. Negative for diarrhea.   All other systems are reviewed and are negative for acute change except as noted in the HPI  Physical Exam Vital Signs  I have reviewed the triage vital signs BP (!) 155/96   Pulse 87   Temp 98.3 F (36.8 C) (Oral)   Resp 16   Ht 5\' 9"  (1.753 m)   Wt 89.8 kg   SpO2 99%   BMI 29.24 kg/m   Physical Exam Vitals reviewed.  Constitutional:      General: He is not in acute distress.    Appearance: He is well-developed. He is not diaphoretic.  HENT:     Head: Normocephalic and atraumatic.     Jaw: No trismus.     Right Ear: External ear normal.     Left Ear: External ear normal.     Nose: Nose normal.  Eyes:     General: No scleral icterus.    Conjunctiva/sclera: Conjunctivae  normal.  Neck:     Trachea: Phonation normal.  Cardiovascular:     Rate and Rhythm: Normal rate and regular rhythm.  Pulmonary:     Effort: Pulmonary effort is normal. No respiratory distress.     Breath sounds: No stridor.  Abdominal:     General: There is no distension.     Tenderness: There is abdominal tenderness in the right upper quadrant and epigastric area. There is no guarding or rebound. Negative signs include Murphy's sign and McBurney's sign.  Musculoskeletal:        General: Normal range of motion.     Cervical back: Normal range of motion.  Neurological:     Mental Status: He is alert and oriented to person, place, and time.  Psychiatric:        Behavior: Behavior normal.     ED Results and Treatments Labs (all labs ordered are listed, but only abnormal results are displayed) Labs Reviewed  BASIC METABOLIC PANEL - Abnormal; Notable for the following components:      Result Value   Sodium 130 (*)    Chloride 94 (*)    Glucose, Bld 283 (*)    Calcium 8.7 (*)    All other components within normal limits  CBC - Abnormal; Notable for the following components:   Hemoglobin 12.8 (*)    All other components within normal limits  HEPATIC FUNCTION PANEL - Abnormal; Notable for the following components:   Albumin 3.4 (*)    Bilirubin, Direct 0.3 (*)    All other components within normal limits  LIPASE, BLOOD - Abnormal; Notable for the following components:   Lipase 346 (*)    All other components within normal limits  CBG MONITORING, ED - Abnormal; Notable for the following components:   Glucose-Capillary 266 (*)    All other components within normal limits  TROPONIN I (HIGH SENSITIVITY) - Abnormal; Notable for the following components:   Troponin I (High Sensitivity) 22 (*)    All other components within normal limits  TROPONIN I (HIGH SENSITIVITY) - Abnormal; Notable for the following components:   Troponin I (High Sensitivity) 23 (*)    All other components  within normal limits                                                                                                                         EKG  EKG Interpretation  Date/Time:  Tuesday June 08 2019 20:22:32 EST Ventricular Rate:  79 PR Interval:  158 QRS Duration: 90 QT Interval:  380 QTC Calculation: 435 R Axis:   85 Text Interpretation: Normal sinus rhythm Minimal voltage criteria for LVH, may be normal variant ( Sokolow-Lyon ) Borderline ECG No significant change since last tracing Confirmed by Addison Lank [phone]) on 06/08/2019 11:18:41 PM      Radiology DG Chest 2 View  Result Date: 06/08/2019 CLINICAL DATA:  Chest pain EXAM: CHEST - 2 VIEW COMPARISON:  May 01, 2018 FINDINGS: The lungs are clear. The heart size and pulmonary vascularity are normal. No adenopathy. No pneumothorax. No bone lesions. IMPRESSION: No abnormality noted. Electronically Signed   By: Lowella Grip III M.D.   On: 06/08/2019 21:08   CT ABDOMEN PELVIS W CONTRAST  Result Date: 06/09/2019 CLINICAL DATA:  Lower abdominal pain, vomiting. History of pancreatitis EXAM: CT ABDOMEN AND PELVIS WITH CONTRAST TECHNIQUE: Multidetector CT imaging of the abdomen and pelvis was performed using the standard protocol following bolus administration of intravenous contrast. CONTRAST:  17mL OMNIPAQUE IOHEXOL 300 MG/ML  SOLN COMPARISON:  05/01/2018 FINDINGS: Lower chest: Lung bases are clear. No effusions. Heart is normal size. Hepatobiliary: No focal hepatic abnormality. Gallbladder unremarkable. Pancreas: No focal abnormality or ductal dilatation. No surrounding inflammation. Spleen: No focal abnormality.  Normal size. Adrenals/Urinary Tract: No adrenal abnormality. No focal renal abnormality. No stones or hydronephrosis. Urinary bladder is unremarkable. Stomach/Bowel: Normal appendix. Stomach, large and small bowel grossly unremarkable. Vascular/Lymphatic: No evidence of aneurysm or adenopathy. Reproductive: No visible  focal abnormality. Other: No free fluid or free air. Musculoskeletal: No acute bony abnormality. IMPRESSION: No acute findings in the abdomen or pelvis. Electronically Signed   By: Rolm Baptise M.D.   On: 06/09/2019 02:34    Pertinent labs & imaging results that were available during my care of the patient were reviewed by me and considered in my medical decision making (see chart for details).  Medications Ordered in ED Medications  sodium chloride flush (NS) 0.9 % injection 3 mL (3 mLs Intravenous Given 06/08/19 2307)  metoCLOPramide (REGLAN) injection 10 mg (10 mg Intravenous Given 06/08/19 2358)  sodium chloride 0.9 % bolus 1,000 mL (0 mLs Intravenous Stopped 06/09/19 0207)  fentaNYL (SUBLIMAZE) injection 50 mcg (50 mcg Intravenous Given 06/09/19 0102)  sodium chloride 0.9 % bolus 1,000 mL (1,000 mLs Intravenous New Bag/Given 06/09/19 0103)  iohexol (OMNIPAQUE) 300 MG/ML solution 100 mL (100 mLs Intravenous Contrast Given 06/09/19 0202)  Procedures Procedures  (including critical care time)  Medical Decision Making / ED Course I have reviewed the nursing notes for this encounter and the patient's prior records (if available in EHR or on provided paperwork).   Raymie Gambill was evaluated in Emergency Department on 06/09/2019 for the symptoms described in the history of present illness. He was evaluated in the context of the global COVID-19 pandemic, which necessitated consideration that the patient might be at risk for infection with the SARS-CoV-2 virus that causes COVID-19. Institutional protocols and algorithms that pertain to the evaluation of patients at risk for COVID-19 are in a state of rapid change based on information released by regulatory bodies including the CDC and federal and state organizations. These policies and algorithms were followed during the  patient's care in the ED.  Presents with upper abdominal pain rating into the chest.  Primarily GI etiology.  EKG without acute ischemic changes or evidence of pericarditis.  Initial troponin was mildly elevated.  Second troponin stable.  Doubt cardiac etiology.  Patient was recently seen for acute pancreatitis.  Patient has an uptrending lipase up from 170 - 340.  CT scan without evidence of pancreatic inflammation.  No other intra-abdominal Fama to assess infectious process or bowel obstruction.  Patient treated symptomatically with IV fluids, and antiemetics.  IV pain medicine also provided.  Patient had significant relief.  Able to tolerate oral intake.  Stable for outpatient management.  Patient has close follow-up with PCP within 2 days.  Instructed to initiate clear liquid diet and slowly transition to the pancreatitis diet.      Final Clinical Impression(s) / ED Diagnoses Final diagnoses:  Elevated lipase  Epigastric abdominal pain  Nausea and vomiting in adult    The patient appears reasonably screened and/or stabilized for discharge and I doubt any other medical condition or other [facility] requiring further screening, evaluation, or treatment in the ED at this time prior to discharge.  Disposition: Discharge  Condition: Good  I have discussed the results, Dx and Tx plan with the patient who expressed understanding and agree(s) with the plan. Discharge instructions discussed at great length. The patient was given strict return precautions who verbalized understanding of the instructions. No further questions at time of discharge.    ED Discharge Orders         Ordered    ondansetron (ZOFRAN ODT) 4 MG disintegrating tablet  Every 8 hours PRN     06/09/19 0342            Follow Up: Arvilla Market, DO [address] [place] [address] [phone]  Schedule an appointment as soon as possible for a visit       This chart was dictated using voice  recognition software.  Despite best efforts to proofread,  errors can occur which can change the documentation meaning.   Nira Conn, MD 06/09/19 [phone]

## 2019-06-08 NOTE — ED Triage Notes (Signed)
Pt presents to ED from home. Pt c/o new cp, new abd pain, and pancrease pain. Pt states new pain began yesterday. Pt taken ibuprofen. Pt NAD.

## 2019-06-09 ENCOUNTER — Encounter (HOSPITAL_COMMUNITY): Payer: Self-pay | Admitting: Radiology

## 2019-06-09 ENCOUNTER — Emergency Department (HOSPITAL_COMMUNITY): Payer: Self-pay

## 2019-06-09 LAB — HEPATIC FUNCTION PANEL
ALT: 18 U/L (ref 0–44)
AST: 28 U/L (ref 15–41)
Albumin: 3.4 g/dL — ABNORMAL LOW (ref 3.5–5.0)
Alkaline Phosphatase: 80 U/L (ref 38–126)
Bilirubin, Direct: 0.3 mg/dL — ABNORMAL HIGH (ref 0.0–0.2)
Indirect Bilirubin: 0.9 mg/dL (ref 0.3–0.9)
Total Bilirubin: 1.2 mg/dL (ref 0.3–1.2)
Total Protein: 7.4 g/dL (ref 6.5–8.1)

## 2019-06-09 LAB — LIPASE, BLOOD: Lipase: 346 U/L — ABNORMAL HIGH (ref 11–51)

## 2019-06-09 LAB — TROPONIN I (HIGH SENSITIVITY): Troponin I (High Sensitivity): 23 ng/L — ABNORMAL HIGH (ref <18)

## 2019-06-09 MED ORDER — IOHEXOL 300 MG/ML  SOLN
100.0000 mL | Freq: Once | INTRAMUSCULAR | Status: AC | PRN
  Administered 2019-06-09: 100 mL via INTRAVENOUS

## 2019-06-09 MED ORDER — ONDANSETRON 4 MG PO TBDP: 4.0000 mg | ORAL_TABLET | Freq: Three times a day (TID) | ORAL | 0 refills | Status: DC | PRN

## 2019-06-09 MED ORDER — FENTANYL CITRATE (PF) 100 MCG/2ML IJ SOLN
50.0000 ug | Freq: Once | INTRAMUSCULAR | Status: AC
  Administered 2019-06-09: 50 ug via INTRAVENOUS
  Filled 2019-06-09: qty 2

## 2019-06-09 MED ORDER — SODIUM CHLORIDE 0.9 % IV BOLUS
1000.0000 mL | Freq: Once | INTRAVENOUS | Status: AC
  Administered 2019-06-09: 1000 mL via INTRAVENOUS

## 2019-06-09 NOTE — Discharge Instructions (Signed)
You will need to be on a clear liquid diet for 1 week and then slowly progressed to a pancreatic eating plan.  Instructions are attached.  Please follow-up with your primary care provider as scheduled.

## 2019-06-10 ENCOUNTER — Ambulatory Visit: Payer: Self-pay | Attending: Family Medicine | Admitting: Pharmacist

## 2019-06-10 ENCOUNTER — Telehealth: Payer: Self-pay | Admitting: Internal Medicine

## 2019-06-10 ENCOUNTER — Other Ambulatory Visit: Payer: Self-pay

## 2019-06-10 DIAGNOSIS — E785 Hyperlipidemia, unspecified: Secondary | ICD-10-CM

## 2019-06-10 DIAGNOSIS — E1165 Type 2 diabetes mellitus with hyperglycemia: Secondary | ICD-10-CM

## 2019-06-10 DIAGNOSIS — E119 Type 2 diabetes mellitus without complications: Secondary | ICD-10-CM

## 2019-06-10 DIAGNOSIS — I10 Essential (primary) hypertension: Secondary | ICD-10-CM

## 2019-06-10 MED ORDER — ONDANSETRON 4 MG PO TBDP: 4.0000 mg | ORAL_TABLET | Freq: Three times a day (TID) | ORAL | 0 refills | Status: DC | PRN

## 2019-06-10 MED ORDER — TRUEPLUS 5-BEVEL PEN NEEDLES 32G X 4 MM MISC: 11 refills | Status: DC

## 2019-06-10 MED ORDER — TRUEPLUS LANCETS 28G MISC: 11 refills | Status: DC

## 2019-06-10 MED ORDER — LISINOPRIL 40 MG PO TABS: 40.0000 mg | ORAL_TABLET | Freq: Every day | ORAL | 2 refills | Status: DC

## 2019-06-10 MED ORDER — TRUE METRIX METER W/DEVICE KIT: PACK | 0 refills | Status: DC

## 2019-06-10 MED ORDER — BASAGLAR KWIKPEN 100 UNIT/ML ~~LOC~~ SOPN: 10.0000 [IU] | PEN_INJECTOR | Freq: Every day | SUBCUTANEOUS | 2 refills | Status: DC

## 2019-06-10 MED ORDER — GLUCOSE BLOOD VI STRP: ORAL_STRIP | 11 refills | Status: DC

## 2019-06-10 MED ORDER — ATORVASTATIN CALCIUM 80 MG PO TABS: 80.0000 mg | ORAL_TABLET | Freq: Every day | ORAL | 0 refills | Status: DC

## 2019-06-10 MED FILL — TRUEPLUS PEN NDL 32GX5/32": 32G X 4 MM | 25 days supply | Qty: 100 | Fill #0

## 2019-06-10 MED FILL — !TRUE METRIX BLOOD GLUCOSE: 30 days supply | Qty: 1 | Fill #0

## 2019-06-10 MED FILL — TRUEPLUS PEN NDL 32GX5/32: 32G X 4 MM | 25 days supply | Qty: 100 | Fill #0

## 2019-06-10 MED FILL — TRUEplus LANCETS 28G MISC: 33 days supply | Qty: 100 | Fill #0

## 2019-06-10 MED FILL — ATORVASTATIN 80 MG TABLET: 80 | 30 days supply | Qty: 30 | Fill #0

## 2019-06-10 MED FILL — ONDANSETRON ODT 4 MG TABLET: 4 | 6 days supply | Qty: 20 | Fill #0

## 2019-06-10 MED FILL — TRUE METRIX GLUCOSE TEST ST: 33 days supply | Qty: 100 | Fill #0

## 2019-06-10 MED FILL — LISINOPRIL 40 MG TABLET: 40 | 30 days supply | Qty: 30 | Fill #0

## 2019-06-10 MED FILL — !LANTUS SOLOSTAR 100UNITS/M: 100 | 30 days supply | Qty: 3 | Fill #0

## 2019-06-10 NOTE — Telephone Encounter (Signed)
Patient called and stated that he did not get any of his needles for his Stephanie Coup at his appointment today. Please fu at your earliest convenience.

## 2019-06-10 NOTE — Progress Notes (Signed)
S:    PCP: Dr. Earlene Plater   No chief complaint on file.  Patient arrives in good spirits.  Presents for diabetes evaluation, education, and management. Patient was referred and last seen by Primary Care Provider on 05/31/2019.   Patient reports diabetes was diagnosed at age 39.   Family/Social History:  - FHx: DM, HTN - Tobacco: former smoker (quit in 2013) - Alcohol: denies use   Insurance coverage/medication affordability: self pay  Patient reports adherence with medications.  Current diabetes medications include: glipizide 10 mg daily; unable to tolerate metformin Current hypertension medications include: carvedilol 12.5 mg BID, lisinopril 40 mg daily Current hyperlipidemia medications include: atorvastatin 80 mg daily  Patient denies hypoglycemic events.  Patient reported dietary habits:  - Patient reports being on liquid/soft diet d/t NV  Patient-reported exercise habits:  - Limited    Patient reports polyuria, polydipsia.  Patient denies neuropathy (nerve pain). Patient reports visual changes. Patient reports self foot exams.    Pancreatitis: yes Thyroid cancer (personal or fhx): no  O:  POCT: 226  Lab Results  Component Value Date   HGBA1C 12.8 05/31/2019   There were no vitals filed for this visit.  Lipid Panel     Component Value Date/Time   CHOL 322 (H) 05/31/2019 1001   TRIG 424 (H) 05/31/2019 1001   HDL 38 (L) 05/31/2019 1001   CHOLHDL 8.5 (H) 05/31/2019 1001   LDLCALC 196 (H) 05/31/2019 1001   Home fasting blood sugars: not checking; no meter  2 hour post-meal/random blood sugars: not checking; no meter.  Clinical Atherosclerotic Cardiovascular Disease (ASCVD): No  The ASCVD Risk score Denman George DC Jr., et al., 2013) failed to calculate for the following reasons:   The valid total cholesterol range is 130 to 320 mg/dL   A/P: Diabetes longstanding currently uncontrolled. Patient is able to verbalize appropriate hypoglycemia management plan.  Patient is adherent with medication currently, but review of charts reveals past problems with medication compliance.   He is having symptoms of hyperglycemia. Hx of pancreatitis noted; recommend to avoid GLP-1 RA for now. A1c > 10. He has microalbuminuria. Will start basal insulin to improve glycemic control quickly. He will benefit from an SGLT-2 inhibitor, such as Jardiance, in the future given his microalbuminuria.   With his NV and diet, I have concerns for poor intake and elevated risk of hypoglycemia with SU + insulin. Will stop glipizide and start Lantus 10 units qHS. Patient was educated on pen technique. I also educated him on home glucose monitoring. Goal levels discussed. I will see him again in two weeks to reassess control and titrate insulin as needed.   -Started Lantus 10 units qhs. -Discontinued glipizide.  -[employer] supplies -Extensively discussed pathophysiology of diabetes, recommended lifestyle interventions, dietary effects on blood sugar control -Counseled on s/sx of and management of hypoglycemia -Next A1C anticipated 08/2019.   ASCVD risk - primary prevention in patient with diabetes. Last LDL is not controlled. ASCVD risk score cannot be calculated d/t TC > 320 mg/dL. High intensity statin indicated with optimal LDL goal of < 70. He may benefit from Zetia 10 mg in the future if LDL goal cannot be obtained with atorvastatin alone. -Continued atorvastatin 80 mg.   Hm: flu shot deferred. Pneumovax-23 indicated. Tetanus UTD.  - Will recommend Pneumovax at next visit.   Written patient instructions provided.  Total time in face to face counseling 30 minutes.   Follow up Pharmacist Clinic Visit in 2 weeks.  Randy [name]  Daisy Blossom, PharmD, [place] [phone]

## 2019-06-10 NOTE — Telephone Encounter (Signed)
Patient was informed to come pick up needles. Patient verbalized understanding and had no further questions.

## 2019-06-14 ENCOUNTER — Other Ambulatory Visit: Payer: Self-pay

## 2019-06-25 ENCOUNTER — Ambulatory Visit: Payer: Self-pay | Admitting: Pharmacist

## 2019-06-29 ENCOUNTER — Ambulatory Visit: Payer: Self-pay | Admitting: Pharmacist

## 2019-07-09 ENCOUNTER — Ambulatory Visit: Payer: Self-pay | Admitting: Internal Medicine

## 2019-07-16 MED FILL — LISINOPRIL 40 MG TABLET: 40 | 30 days supply | Qty: 30 | Fill #1

## 2019-07-16 MED FILL — !LANTUS SOLOSTAR 100UNITS/M: 100 | 28 days supply | Qty: 3 | Fill #1

## 2019-07-22 ENCOUNTER — Encounter: Payer: Self-pay | Admitting: Emergency Medicine

## 2019-07-22 ENCOUNTER — Other Ambulatory Visit: Payer: Self-pay

## 2019-07-22 ENCOUNTER — Emergency Department (HOSPITAL_COMMUNITY): Payer: Self-pay

## 2019-07-22 ENCOUNTER — Ambulatory Visit
Admission: EM | Admit: 2019-07-22 | Discharge: 2019-07-22 | Disposition: A | Discharge status: Home / Self Care | Payer: Self-pay | Attending: Internal Medicine | Admitting: Internal Medicine

## 2019-07-22 ENCOUNTER — Emergency Department (HOSPITAL_COMMUNITY)
Admission: EM | Admit: 2019-07-22 | Discharge: 2019-07-22 | Disposition: A | Discharge status: Home / Self Care | Payer: Self-pay | Attending: Emergency Medicine | Admitting: Emergency Medicine

## 2019-07-22 ENCOUNTER — Encounter (HOSPITAL_COMMUNITY): Payer: Self-pay

## 2019-07-22 DIAGNOSIS — R519 Headache, unspecified: Secondary | ICD-10-CM | POA: Insufficient documentation

## 2019-07-22 DIAGNOSIS — Z794 Long term (current) use of insulin: Secondary | ICD-10-CM | POA: Insufficient documentation

## 2019-07-22 DIAGNOSIS — I10 Essential (primary) hypertension: Secondary | ICD-10-CM | POA: Insufficient documentation

## 2019-07-22 DIAGNOSIS — E119 Type 2 diabetes mellitus without complications: Secondary | ICD-10-CM | POA: Insufficient documentation

## 2019-07-22 DIAGNOSIS — R112 Nausea with vomiting, unspecified: Secondary | ICD-10-CM | POA: Insufficient documentation

## 2019-07-22 DIAGNOSIS — H53149 Visual discomfort, unspecified: Secondary | ICD-10-CM | POA: Insufficient documentation

## 2019-07-22 DIAGNOSIS — R110 Nausea: Secondary | ICD-10-CM

## 2019-07-22 DIAGNOSIS — Z79899 Other long term (current) drug therapy: Secondary | ICD-10-CM | POA: Insufficient documentation

## 2019-07-22 DIAGNOSIS — I160 Hypertensive urgency: Secondary | ICD-10-CM

## 2019-07-22 LAB — CBC WITH DIFFERENTIAL/PLATELET
Abs Immature Granulocytes: 0.03 10*3/uL (ref 0.00–0.07)
Basophils Absolute: 0.1 10*3/uL (ref 0.0–0.1)
Basophils Relative: 1 %
Eosinophils Absolute: 0 10*3/uL (ref 0.0–0.5)
Eosinophils Relative: 1 %
HCT: 40.9 % (ref 39.0–52.0)
Hemoglobin: 13.4 g/dL (ref 13.0–17.0)
Immature Granulocytes: 0 %
Lymphocytes Relative: 21 %
Lymphs Abs: 1.8 10*3/uL (ref 0.7–4.0)
MCH: 27.3 pg (ref 26.0–34.0)
MCHC: 32.8 g/dL (ref 30.0–36.0)
MCV: 83.5 fL (ref 80.0–100.0)
Monocytes Absolute: 0.4 10*3/uL (ref 0.1–1.0)
Monocytes Relative: 5 %
Neutro Abs: 6.3 10*3/uL (ref 1.7–7.7)
Neutrophils Relative %: 72 %
Platelets: 339 10*3/uL (ref 150–400)
RBC: 4.9 MIL/uL (ref 4.22–5.81)
RDW: 12.6 % (ref 11.5–15.5)
WBC: 8.6 10*3/uL (ref 4.0–10.5)
nRBC: 0 % (ref 0.0–0.2)

## 2019-07-22 LAB — COMPREHENSIVE METABOLIC PANEL
ALT: 29 U/L (ref 0–44)
AST: 30 U/L (ref 15–41)
Albumin: 3.6 g/dL (ref 3.5–5.0)
Alkaline Phosphatase: 149 U/L — ABNORMAL HIGH (ref 38–126)
Anion gap: 10 (ref 5–15)
BUN: 12 mg/dL (ref 6–20)
CO2: 26 mmol/L (ref 22–32)
Calcium: 9.5 mg/dL (ref 8.9–10.3)
Chloride: 99 mmol/L (ref 98–111)
Creatinine, Ser: 0.94 mg/dL (ref 0.61–1.24)
GFR calc Af Amer: >60 mL/min (ref >60)
GFR calc non Af Amer: >60 mL/min (ref >60)
Glucose, Bld: 255 mg/dL — ABNORMAL HIGH (ref 70–99)
Potassium: 4.1 mmol/L (ref 3.5–5.1)
Sodium: 135 mmol/L (ref 135–145)
Total Bilirubin: 0.9 mg/dL (ref 0.3–1.2)
Total Protein: 8.3 g/dL — ABNORMAL HIGH (ref 6.5–8.1)

## 2019-07-22 LAB — TROPONIN I (HIGH SENSITIVITY)
Troponin I (High Sensitivity): 16 ng/L (ref <18)
Troponin I (High Sensitivity): 22 ng/L — ABNORMAL HIGH (ref <18)

## 2019-07-22 LAB — POCT FASTING CBG KUC MANUAL ENTRY: POCT Glucose (KUC): 253 mg/dL — AB (ref 70–99)

## 2019-07-22 MED ORDER — LISINOPRIL 20 MG PO TABS: 40.0000 mg | ORAL_TABLET | Freq: Once | ORAL | Status: DC

## 2019-07-22 MED ORDER — HYDRALAZINE HCL 20 MG/ML IJ SOLN
10.0000 mg | Freq: Once | INTRAMUSCULAR | Status: AC
  Administered 2019-07-22: 10 mg via INTRAVENOUS
  Filled 2019-07-22: qty 1

## 2019-07-22 MED ORDER — DIPHENHYDRAMINE HCL 50 MG/ML IJ SOLN
25.0000 mg | Freq: Once | INTRAMUSCULAR | Status: AC
  Administered 2019-07-22: 25 mg via INTRAVENOUS
  Filled 2019-07-22: qty 1

## 2019-07-22 MED ORDER — CLONIDINE HCL 0.1 MG PO TABS
0.1000 mg | ORAL_TABLET | Freq: Once | ORAL | Status: AC
  Administered 2019-07-22: 0.1 mg via ORAL

## 2019-07-22 MED ORDER — IOHEXOL 350 MG/ML SOLN
75.0000 mL | Freq: Once | INTRAVENOUS | Status: AC | PRN
  Administered 2019-07-22: 75 mL via INTRAVENOUS

## 2019-07-22 MED ORDER — ONDANSETRON 4 MG PO TBDP
4.0000 mg | ORAL_TABLET | Freq: Once | ORAL | Status: AC
  Administered 2019-07-22: 4 mg via ORAL

## 2019-07-22 MED ORDER — CARVEDILOL 12.5 MG PO TABS: 12.5000 mg | ORAL_TABLET | Freq: Once | ORAL | Status: DC

## 2019-07-22 MED ORDER — METOCLOPRAMIDE HCL 5 MG/ML IJ SOLN
10.0000 mg | Freq: Once | INTRAMUSCULAR | Status: AC
  Administered 2019-07-22: 10 mg via INTRAVENOUS
  Filled 2019-07-22: qty 2

## 2019-07-22 NOTE — Discharge Instructions (Addendum)
Hypertensive urgency, Left eye pressure T wave inversions lead III and avF

## 2019-07-22 NOTE — ED Provider Notes (Signed)
[facility] EMERGENCY DEPARTMENT Provider Note   CSN: [id number] Arrival date & time: 07/22/19  1432     History Chief Complaint  Patient presents with  . Hypertension   Randy [name] is a 39 y.o. male with PMH of HTN, uncontrolled T2DM, presenting today after being sent by EMS from urgent care for elevated BP, eye pressure, and nausea.  Patient reports that he ran out of his lisinopril on Friday and was unable to pick it up until Monday but he has been taking the lisinopril regularly since Monday.  Patient states that he normally takes his lisinopril at night but because his blood pressure was elevated he decided to take it this morning instead. Previously was on carvedilol also for blood pressure, but denies current use of this. Upon chart review last PCP visit on 1/28 and it was recommended he continue carvedilol 12.5 mg.  Patient states that he developed pressure behind his eye at 5 PM yesterday along with a mild headache. Denies any vision changes, double vision or spots.  Patient denies any focal weakness, change in speech, numbness or tingling. Reports that he has never had this happen before.  He states that this morning he developed nausea and had one episode of NBNB vomiting.  He then went to an urgent care where he was evaluated and found to be hypertensive to  Past Medical History:  Diagnosis Date  . Hypertension   . Pancreatitis   . Type II diabetes mellitus [facility])    Patient Active Problem List   Diagnosis Date Noted  . Hyperlipidemia 06/01/2019  . Microalbuminuria due to type 2 diabetes mellitus ([place]) 06/01/2019  . Essential hypertension 05/31/2019  . Intractable nausea and vomiting 08/31/2015  . Hypertensive urgency 08/31/2015  . Uncontrolled diabetes mellitus ([place]) 08/31/2015  . Marijuana abuse 08/31/2015  . Noncompliance with medication regimen 08/31/2015   Past Surgical History:  Procedure Laterality Date  . NO PAST SURGERIES       Family  History  Problem Relation Age of Onset  . Hypertension Mother   . Diabetes Father   . Hypertension Father    Social History   Tobacco Use  . Smoking status: Former Smoker    Packs/day: 0.33    Years: 17.00    Pack years: 5.61    Types: Cigarettes    Quit date: 12/26/2011    Years since quitting: 7.5  . Smokeless tobacco: Never Used  Substance Use Topics  . Alcohol use: No  . Drug use: Yes    Types: Marijuana    Comment: 08/31/2015 "frequent times/day"   Home Medications Prior to Admission medications   Medication Sig Start Date End Date Taking? Authorizing Provider  atorvastatin (LIPITOR) 80 MG tablet Take 1 tablet (80 mg total) by mouth daily. 06/10/19   Charlott Rakes, MD  Blood Glucose Monitoring Suppl (TRUE METRIX METER) w/Device KIT Use to check blood sugar TID. E11.65 06/10/19   Charlott Rakes, MD  carvedilol (COREG) 12.5 MG tablet Take 1 tablet (12.5 mg total) by mouth 2 (two) times daily with a meal. 04/14/19   Providence Lanius A, PA-C  glucose blood test strip Use as instructed to check TID. E11.65 06/10/19   Charlott Rakes, MD  Insulin Glargine (BASAGLAR KWIKPEN) 100 UNIT/ML SOPN Inject 0.1 mLs (10 Units total) into the skin daily. 06/10/19   Charlott Rakes, MD  Insulin Pen Needle (TRUEPLUS 5-BEVEL PEN NEEDLES) 32G X 4 MM MISC Use to inject insulin daily. 06/10/19   Newlin, Charlane Ferretti,  MD  lisinopril (ZESTRIL) 40 MG tablet Take 1 tablet (40 mg total) by mouth daily. 06/10/19   Charlott Rakes, MD  ondansetron (ZOFRAN-ODT) 4 MG disintegrating tablet Take 1 tablet (4 mg total) by mouth every 8 (eight) hours as needed for nausea or vomiting. 06/10/19   Charlott Rakes, MD  TRUEplus Lancets 28G MISC Use to check blood sugar three times daily. E11.65 06/10/19   Charlott Rakes, MD   Allergies    Patient has no known allergies.  Review of Systems   Review of Systems  Constitutional: Negative for chills and fever.  HENT: Negative for congestion, sore throat, trouble swallowing and  voice change.   Eyes: Positive for photophobia and pain. Negative for visual disturbance.  Respiratory: Negative for chest tightness and shortness of breath.   Cardiovascular: Negative for chest pain and palpitations.  Gastrointestinal: Positive for nausea and vomiting. Negative for abdominal pain, blood in stool, constipation and diarrhea.  Neurological: Positive for headaches. Negative for dizziness, speech difficulty, weakness, light-headedness and numbness.    Physical Exam Updated Vital Signs There were no vitals taken for this visit.  Physical Exam Vitals and nursing note reviewed.  Constitutional:      Appearance: Normal appearance. He is normal weight.  HENT:     Head: Normocephalic and atraumatic.     Nose: Nose normal.     Mouth/Throat:     Mouth: Mucous membranes are moist.  Eyes:     General: No scleral icterus.    Extraocular Movements: Extraocular movements intact.     Conjunctiva/sclera: Conjunctivae normal.     Pupils: Pupils are equal, round, and reactive to light.  Cardiovascular:     Rate and Rhythm: Normal rate and regular rhythm.     Heart sounds: Normal heart sounds.  Pulmonary:     Effort: Pulmonary effort is normal.     Breath sounds: Normal breath sounds.  Abdominal:     General: Bowel sounds are normal. There is no distension.     Palpations: Abdomen is soft.  Musculoskeletal:        General: Normal range of motion.     Cervical back: Normal range of motion.  Skin:    General: Skin is warm.     Capillary Refill: Capillary refill takes less than 2 seconds.  Neurological:     General: No focal deficit present.     Mental Status: He is alert and oriented to person, place, and time. Mental status is at baseline.     Cranial Nerves: No cranial nerve deficit.     Sensory: No sensory deficit.     Motor: No weakness.     Gait: Gait normal.  Psychiatric:        Mood and Affect: Mood normal.        Behavior: Behavior normal.     ED Results /  Procedures / Treatments   Labs (all labs ordered are listed, but only abnormal results are displayed) Labs Reviewed - No data to display  EKG None  Radiology No results found.  Procedures Procedures (including critical care time)  Medications Ordered in ED Medications - No data to display  ED Course  I have reviewed the triage vital signs and the nursing notes.  Pertinent labs & imaging results that were available during my care of the patient were reviewed by me and considered in my medical decision making (see chart for details).    MDM Rules/Calculators/A&P  39 yo male here with pressure behind eye and htn. EKG with nonspecific t-wave inversion in inferior leads but patient currently with no chest pain, SOB. Will obtain hsTroponin to r/o demand ischemia in setting of HTN. Headache and eye pressure concerning for possible retinal artery occlusion. No visual deficits on exam or other focal findings c/w stroke. Will obtain CTA head and neck to r/o RAO. Given migraine cocktail and hydralazine to see if sx resolve with improvement of blood pressure or If patient has complicated migraine. CMP, CBC in process. Will sign out to night team to complete work up.   Randy [name] was evaluated in Emergency Department on 07/22/2019 for the symptoms described in the history of present illness. He was evaluated in the context of the global COVID-19 pandemic, which necessitated consideration that the patient might be at risk for infection with the SARS-CoV-2 virus that causes COVID-19. Institutional protocols and algorithms that pertain to the evaluation of patients at risk for COVID-19 are in a state of rapid change based on information released by regulatory bodies including the CDC and federal and state organizations. These policies and algorithms were followed during the patient's care in the ED. Final Clinical Impression(s) / ED Diagnoses Final diagnoses:  None    Rx / DC  Orders ED Discharge Orders    None       Tsugio Elison, [place], DO 07/22/19 1623    Drenda Freeze, MD 07/23/19 [phone]

## 2019-07-22 NOTE — ED Notes (Signed)
Patient continues to c/o nausea.  APP aware of no changes to BP since last retake, CBG result and provided EKG.

## 2019-07-22 NOTE — ED Triage Notes (Signed)
Pt BIB [facility] for eval of pressure behind L eye x 24 hours. Seen at [facility], sent here for further eval d/t HTN. Pt reports that he has been off his hypertension meds x 3 days. EMS reports that EMS had informed them of some inverted T waves on his 12 lead, pt denies CP

## 2019-07-22 NOTE — ED Provider Notes (Signed)
Patient is seen after prior ED provider.  Patient's imaging study did not reveal significant acute pathology.  Patient now feels comfortable.  He desires discharge.  He does have established primary care and was given a refill on his lisinopril earlier this week.  He assures me that he will follow-up closely with his PMD.  Strict return precautions given and understood.   Wynetta Fines, MD 07/22/19 2026

## 2019-07-22 NOTE — ED Provider Notes (Signed)
[facility]    CSN: [id number] Arrival date & time: 07/22/19  1156      History   Chief Complaint Chief Complaint  Patient presents with  . Eye Pain    HPI Ramond Darnell is a 39 y.o. male history of hypertension, uncontrolled DM type II, presenting today for evaluation of elevated blood pressure and eye pressure.  Patient states that over the past 24 hours he has developed pressure behind his left eye.  This prompted him to check his blood pressure and notes that it was around 768 systolic over 115 diastolic.  Check blood sugar and was 287.  He recently was out of medicines for approximately 3 days, but had these refilled on Monday and has been taking them for the past 4 days but.  Took lisinopril this morning.  Has had prior similar symptoms with pressure behind left eye with elevated blood pressure.  Feels very similar to past spells that have improved with blood pressure decrease.  Denies any vision changes, double vision or spots.  Denies any weakness, difficulty speaking.  Denies chest pain or shortness of breath.  Denies associated headache.  Previously was on carvedilol also for blood pressure, but denies current use of this.  Upon chart review last PCP visit on 1/28 does recommend continued use of carvedilol 12.5 mg.  HPI  Past Medical History:  Diagnosis Date  . Hypertension   . Pancreatitis   . Type II diabetes mellitus [facility])     Patient Active Problem List   Diagnosis Date Noted  . Hyperlipidemia 06/01/2019  . Microalbuminuria due to type 2 diabetes mellitus ([place]) 06/01/2019  . Essential hypertension 05/31/2019  . Intractable nausea and vomiting 08/31/2015  . Hypertensive urgency 08/31/2015  . Uncontrolled diabetes mellitus ([place]) 08/31/2015  . Marijuana abuse 08/31/2015  . Noncompliance with medication regimen 08/31/2015    Past Surgical History:  Procedure Laterality Date  . NO PAST SURGERIES         Home Medications    Prior to Admission  medications   Medication Sig Start Date End Date Taking? Authorizing Provider  atorvastatin (LIPITOR) 80 MG tablet Take 1 tablet (80 mg total) by mouth daily. 06/10/19   Charlott Rakes, MD  Blood Glucose Monitoring Suppl (TRUE METRIX METER) w/Device KIT Use to check blood sugar TID. E11.65 06/10/19   Charlott Rakes, MD  carvedilol (COREG) 12.5 MG tablet Take 1 tablet (12.5 mg total) by mouth 2 (two) times daily with a meal. Patient not taking: Reported on 07/22/2019 04/14/19   Providence Lanius A, PA-C  glucose blood test strip Use as instructed to check TID. E11.65 06/10/19   Charlott Rakes, MD  Insulin Glargine (BASAGLAR KWIKPEN) 100 UNIT/ML SOPN Inject 0.1 mLs (10 Units total) into the skin daily. Patient not taking: Reported on 07/22/2019 06/10/19   Charlott Rakes, MD  insulin glargine (LANTUS) 100 UNIT/ML Solostar Pen Inject 14 Units into the skin daily.    [provider]  Insulin Pen Needle (TRUEPLUS 5-BEVEL PEN NEEDLES) 32G X 4 MM MISC Use to inject insulin daily. 06/10/19   Charlott Rakes, MD  lisinopril (ZESTRIL) 40 MG tablet Take 1 tablet (40 mg total) by mouth daily. 06/10/19   Charlott Rakes, MD  ondansetron (ZOFRAN-ODT) 4 MG disintegrating tablet Take 1 tablet (4 mg total) by mouth every 8 (eight) hours as needed for nausea or vomiting. Patient not taking: Reported on 07/22/2019 06/10/19   Charlott Rakes, MD  TRUEplus Lancets 28G MISC Use to check blood sugar  three times daily. E11.65 06/10/19   Charlott Rakes, MD    Family History Family History  Problem Relation Age of Onset  . Hypertension Mother   . Diabetes Father   . Hypertension Father     Social History Social History   Tobacco Use  . Smoking status: Former Smoker    Packs/day: 0.33    Years: 17.00    Pack years: 5.61    Types: Cigarettes    Quit date: 12/26/2011    Years since quitting: 7.5  . Smokeless tobacco: Never Used  Substance Use Topics  . Alcohol use: No  . Drug use: Yes    Types: Marijuana     Comment: 08/31/2015 "frequent times/day"     Allergies   Patient has no known allergies.   Review of Systems Review of Systems  Constitutional: Negative for fatigue and fever.  HENT: Negative for congestion, sinus pressure and sore throat.   Eyes: Positive for pain. Negative for photophobia and visual disturbance.  Respiratory: Negative for cough and shortness of breath.   Cardiovascular: Negative for chest pain.  Gastrointestinal: Negative for abdominal pain, nausea and vomiting.  Genitourinary: Negative for decreased urine volume and hematuria.  Musculoskeletal: Negative for myalgias, neck pain and neck stiffness.  Neurological: Negative for dizziness, syncope, facial asymmetry, speech difficulty, weakness, light-headedness, numbness and headaches.     Physical Exam Triage Vital Signs ED Triage Vitals  Enc Vitals Group     BP 07/22/19 1207 (!) 215/125     Pulse Rate 07/22/19 1207 73     Resp 07/22/19 1207 18     Temp 07/22/19 1207 97.8 F (36.6 C)     Temp Source 07/22/19 1207 Temporal     SpO2 07/22/19 1207 98 %     Weight --      Height --      Head Circumference --      Peak Flow --      Pain Score 07/22/19 1205 8     Pain Loc --      Pain Edu? --      Excl. in [place]? --    No data found.  Updated Vital Signs BP (!) 189/120 (BP Location: Right Arm)   Pulse 73   Temp 97.8 F (36.6 C) (Temporal)   Resp 18   SpO2 98%   Visual Acuity Right Eye Distance:   Left Eye Distance:   Bilateral Distance:    Right Eye Near: R Near: 20/20 Left Eye Near:  L Near: 20/50 Bilateral Near:  20/20  Physical Exam Vitals and nursing note reviewed.  Constitutional:      Appearance: He is well-developed.  HENT:     Head: Normocephalic and atraumatic.     Ears:     Comments: Bilateral ears without tenderness to palpation of external auricle, tragus and mastoid, EAC's without erythema or swelling, TM's with good bony landmarks and cone of light. Non erythematous.      Mouth/Throat:     Comments: Oral mucosa pink and moist, no tonsillar enlargement or exudate. Posterior pharynx patent and nonerythematous, no uvula deviation or swelling. Normal phonation. Palate elevates symmetrically Eyes:     Extraocular Movements: Extraocular movements intact.     Conjunctiva/sclera: Conjunctivae normal.     Pupils: Pupils are equal, round, and reactive to light.  Cardiovascular:     Rate and Rhythm: Normal rate and regular rhythm.     Heart sounds: No murmur.  Pulmonary:     Effort: Pulmonary  effort is normal. No respiratory distress.     Breath sounds: Normal breath sounds.     Comments: Breathing comfortably at rest, CTABL, no wheezing, rales or other adventitious sounds auscultated Abdominal:     Palpations: Abdomen is soft.     Tenderness: There is no abdominal tenderness.  Musculoskeletal:     Cervical back: Neck supple.  Skin:    General: Skin is warm and dry.  Neurological:     General: No focal deficit present.     Mental Status: He is alert and oriented to person, place, and time.     Comments: Patient A&O x3, cranial nerves II-XII grossly intact, strength at shoulders, hips and knees 5/5, equal bilaterally, patellar reflex 2+ bilaterally. Negative Romberg. Gait without abnormality.      UC Treatments / Results  Labs (all labs ordered are listed, but only abnormal results are displayed) Labs Reviewed  POCT FASTING CBG [facility] MANUAL ENTRY - Abnormal; Notable for the following components:      Result Value   POCT Glucose ([facility]) 253 (*)    All other components within normal limits    EKG    Radiology No results found.  Procedures Procedures (including critical care time)  Medications Ordered in UC Medications  cloNIDine (CATAPRES) tablet 0.1 mg (0.1 mg Oral Given 07/22/19 1230)  ondansetron (ZOFRAN-ODT) disintegrating tablet 4 mg (4 mg Oral Given 07/22/19 1230)    Initial Impression / Assessment and Plan / UC Course  I have reviewed the  triage vital signs and the nursing notes.  Pertinent labs & imaging results that were available during my care of the patient were reviewed by me and considered in my medical decision making (see chart for details).   Blood sugar 253  Hypertensive Urgency-  Has history of similar, provided 0.1 mg clonidine with initial mild improvement of blood pressure, but no significant improvement in pressure in left eye.  No neuro deficits.  Continues to have nausea, EKG obtained, has T wave inversions in lead III and nonspecific inversions in aVF, no elevation or depression noted.  These changes are new from prior EKG on 1/28.  Recommending patient to have further evaluation work-up and monitoring in emergency room.  Patient does not have a ride, will transport via EMS.  Final Clinical Impressions(s) / UC Diagnoses   Final diagnoses:  Hypertensive urgency  Nausea without vomiting     Discharge Instructions     Hypertensive urgency, Left eye pressure T wave inversions lead III and avF    ED Prescriptions    None     PDMP not reviewed this encounter.   Janith Lima, [place] 07/22/19 1628

## 2019-07-22 NOTE — ED Notes (Signed)
Called [facility] for pickup of patient for transport to ER.

## 2019-07-22 NOTE — ED Triage Notes (Addendum)
Pt presents to [facility] for assessment of 24 hours of pressure behind left eye.  States when he is having pain sometimes its because of his blood pressure or his sugar.  States his blood pressure has been elevated, CBG =287 this morning prior to eating.  Patient states he was out of both of his medicines for 3 days, just took all of his daily meds this morning for the first time.

## 2019-07-22 NOTE — ED Notes (Signed)
EMS at bedside for pickup of patient.

## 2019-07-22 NOTE — ED Notes (Signed)
Pt transported to CT ?

## 2019-07-22 NOTE — Discharge Instructions (Addendum)
Return for any problem.  Follow-up with your regular care provider as instructed.  Please take your lisinopril as previously prescribed.

## 2019-07-28 ENCOUNTER — Ambulatory Visit (INDEPENDENT_AMBULATORY_CARE_PROVIDER_SITE_OTHER): Payer: Self-pay | Admitting: Nurse Practitioner

## 2019-07-28 DIAGNOSIS — E119 Type 2 diabetes mellitus without complications: Secondary | ICD-10-CM

## 2019-07-28 DIAGNOSIS — I10 Essential (primary) hypertension: Secondary | ICD-10-CM

## 2019-07-28 DIAGNOSIS — E785 Hyperlipidemia, unspecified: Secondary | ICD-10-CM

## 2019-07-28 MED ORDER — ATORVASTATIN CALCIUM 80 MG PO TABS: 80.0000 mg | ORAL_TABLET | Freq: Every day | ORAL | 2 refills | Status: DC

## 2019-07-28 MED ORDER — ATORVASTATIN CALCIUM 80 MG PO TABS: 80.0000 mg | ORAL_TABLET | Freq: Every day | ORAL | 0 refills | Status: DC

## 2019-07-28 MED ORDER — LISINOPRIL 40 MG PO TABS: 40.0000 mg | ORAL_TABLET | Freq: Every day | ORAL | 2 refills | Status: DC

## 2019-07-28 MED ORDER — CARVEDILOL 12.5 MG PO TABS: 12.5000 mg | ORAL_TABLET | Freq: Two times a day (BID) | ORAL | 0 refills | Status: DC

## 2019-07-28 MED ORDER — INSULIN GLARGINE 100 UNIT/ML SOLOSTAR PEN: 18.0000 [IU] | PEN_INJECTOR | Freq: Every day | SUBCUTANEOUS | 2 refills | Status: DC

## 2019-07-28 MED FILL — CARVEDILOL 12.5 MG TABLET: 12.5 | 30 days supply | Qty: 60 | Fill #0

## 2019-07-28 MED FILL — !LANTUS SOLOSTAR 100UNITS/M: 100 | 33 days supply | Qty: 6 | Fill #0

## 2019-07-28 MED FILL — ATORVASTATIN 80 MG TABLET: 80 | 30 days supply | Qty: 30 | Fill #0
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
## 2019-08-09 MED FILL — $LANTUS SOLOSTAR 100 UNITS/: 100 | 33 days supply | Qty: 6 | Fill #0

## 2019-08-09 MED FILL — CARVEDILOL 12.5 MG TABLET: 12.5 | 30 days supply | Qty: 60 | Fill #0

## 2019-08-24 ENCOUNTER — Telehealth: Payer: Self-pay

## 2019-08-24 NOTE — Telephone Encounter (Signed)

## 2019-08-25 ENCOUNTER — Ambulatory Visit: Payer: Self-pay | Admitting: Internal Medicine

## 2019-08-27 ENCOUNTER — Other Ambulatory Visit: Payer: Self-pay

## 2019-08-27 ENCOUNTER — Emergency Department (HOSPITAL_COMMUNITY): Payer: Self-pay

## 2019-08-27 ENCOUNTER — Inpatient Hospital Stay (HOSPITAL_COMMUNITY): Payer: Self-pay

## 2019-08-27 ENCOUNTER — Inpatient Hospital Stay (HOSPITAL_COMMUNITY)
Admission: EM | Admit: 2019-08-27 | Discharge: 2019-08-28 | DRG: 305 | Disposition: A | Discharge status: Home / Self Care | Payer: Self-pay | Attending: Internal Medicine | Admitting: Internal Medicine

## 2019-08-27 ENCOUNTER — Encounter (HOSPITAL_COMMUNITY): Payer: Self-pay | Admitting: Emergency Medicine

## 2019-08-27 DIAGNOSIS — F1290 Cannabis use, unspecified, uncomplicated: Secondary | ICD-10-CM | POA: Diagnosis present

## 2019-08-27 DIAGNOSIS — Z716 Tobacco abuse counseling: Secondary | ICD-10-CM

## 2019-08-27 DIAGNOSIS — I169 Hypertensive crisis, unspecified: Principal | ICD-10-CM | POA: Diagnosis present

## 2019-08-27 DIAGNOSIS — Z794 Long term (current) use of insulin: Secondary | ICD-10-CM

## 2019-08-27 DIAGNOSIS — E119 Type 2 diabetes mellitus without complications: Secondary | ICD-10-CM

## 2019-08-27 DIAGNOSIS — R350 Frequency of micturition: Secondary | ICD-10-CM | POA: Diagnosis present

## 2019-08-27 DIAGNOSIS — I161 Hypertensive emergency: Secondary | ICD-10-CM | POA: Diagnosis present

## 2019-08-27 DIAGNOSIS — R519 Headache, unspecified: Secondary | ICD-10-CM | POA: Diagnosis present

## 2019-08-27 DIAGNOSIS — I160 Hypertensive urgency: Secondary | ICD-10-CM

## 2019-08-27 DIAGNOSIS — R9431 Abnormal electrocardiogram [ECG] [EKG]: Secondary | ICD-10-CM

## 2019-08-27 DIAGNOSIS — I674 Hypertensive encephalopathy: Secondary | ICD-10-CM | POA: Diagnosis present

## 2019-08-27 DIAGNOSIS — Z833 Family history of diabetes mellitus: Secondary | ICD-10-CM

## 2019-08-27 DIAGNOSIS — R748 Abnormal levels of other serum enzymes: Secondary | ICD-10-CM | POA: Diagnosis present

## 2019-08-27 DIAGNOSIS — E1165 Type 2 diabetes mellitus with hyperglycemia: Secondary | ICD-10-CM | POA: Diagnosis present

## 2019-08-27 DIAGNOSIS — Z20822 Contact with and (suspected) exposure to covid-19: Secondary | ICD-10-CM | POA: Diagnosis present

## 2019-08-27 DIAGNOSIS — I10 Essential (primary) hypertension: Secondary | ICD-10-CM | POA: Diagnosis present

## 2019-08-27 DIAGNOSIS — Z7151 Drug abuse counseling and surveillance of drug abuser: Secondary | ICD-10-CM

## 2019-08-27 DIAGNOSIS — F17200 Nicotine dependence, unspecified, uncomplicated: Secondary | ICD-10-CM | POA: Diagnosis present

## 2019-08-27 DIAGNOSIS — Z8249 Family history of ischemic heart disease and other diseases of the circulatory system: Secondary | ICD-10-CM

## 2019-08-27 DIAGNOSIS — E785 Hyperlipidemia, unspecified: Secondary | ICD-10-CM | POA: Diagnosis present

## 2019-08-27 DIAGNOSIS — D509 Iron deficiency anemia, unspecified: Secondary | ICD-10-CM | POA: Diagnosis present

## 2019-08-27 DIAGNOSIS — Z6831 Body mass index (BMI) 31.0-31.9, adult: Secondary | ICD-10-CM

## 2019-08-27 DIAGNOSIS — E669 Obesity, unspecified: Secondary | ICD-10-CM | POA: Diagnosis present

## 2019-08-27 DIAGNOSIS — Z79899 Other long term (current) drug therapy: Secondary | ICD-10-CM

## 2019-08-27 DIAGNOSIS — F17210 Nicotine dependence, cigarettes, uncomplicated: Secondary | ICD-10-CM | POA: Diagnosis present

## 2019-08-27 LAB — ECHOCARDIOGRAM COMPLETE
Height: 67 in
Weight: 3168 oz

## 2019-08-27 LAB — CBC
HCT: 36.1 % — ABNORMAL LOW (ref 39.0–52.0)
Hemoglobin: 11.3 g/dL — ABNORMAL LOW (ref 13.0–17.0)
MCH: 27 pg (ref 26.0–34.0)
MCHC: 31.3 g/dL (ref 30.0–36.0)
MCV: 86.2 fL (ref 80.0–100.0)
Platelets: 332 10*3/uL (ref 150–400)
RBC: 4.19 MIL/uL — ABNORMAL LOW (ref 4.22–5.81)
RDW: 13.1 % (ref 11.5–15.5)
WBC: 8.4 10*3/uL (ref 4.0–10.5)
nRBC: 0 % (ref 0.0–0.2)

## 2019-08-27 LAB — COMPREHENSIVE METABOLIC PANEL
ALT: 33 U/L (ref 0–44)
AST: 31 U/L (ref 15–41)
Albumin: 3 g/dL — ABNORMAL LOW (ref 3.5–5.0)
Alkaline Phosphatase: 180 U/L — ABNORMAL HIGH (ref 38–126)
Anion gap: 11 (ref 5–15)
BUN: 16 mg/dL (ref 6–20)
CO2: 24 mmol/L (ref 22–32)
Calcium: 9 mg/dL (ref 8.9–10.3)
Chloride: 100 mmol/L (ref 98–111)
Creatinine, Ser: 0.95 mg/dL (ref 0.61–1.24)
GFR calc Af Amer: >60 mL/min (ref >60)
GFR calc non Af Amer: >60 mL/min (ref >60)
Glucose, Bld: 307 mg/dL — ABNORMAL HIGH (ref 70–99)
Potassium: 5 mmol/L (ref 3.5–5.1)
Sodium: 135 mmol/L (ref 135–145)
Total Bilirubin: 0.9 mg/dL (ref 0.3–1.2)
Total Protein: 7 g/dL (ref 6.5–8.1)

## 2019-08-27 LAB — RAPID URINE DRUG SCREEN, HOSP PERFORMED
Amphetamines: NOT DETECTED
Barbiturates: NOT DETECTED
Benzodiazepines: NOT DETECTED
Cocaine: NOT DETECTED
Opiates: NOT DETECTED
Tetrahydrocannabinol: POSITIVE — AB

## 2019-08-27 LAB — URINALYSIS, ROUTINE W REFLEX MICROSCOPIC
Bacteria, UA: NONE SEEN
Bilirubin Urine: NEGATIVE
Glucose, UA: >=500 mg/dL — AB
Ketones, ur: NEGATIVE mg/dL
Leukocytes,Ua: NEGATIVE
Nitrite: NEGATIVE
Protein, ur: 100 mg/dL — AB
Specific Gravity, Urine: 1.014 (ref 1.005–1.030)
pH: 8 (ref 5.0–8.0)

## 2019-08-27 LAB — TROPONIN I (HIGH SENSITIVITY)
Troponin I (High Sensitivity): 14 ng/L (ref <18)
Troponin I (High Sensitivity): 17 ng/L (ref <18)

## 2019-08-27 LAB — IRON AND TIBC
Iron: 43 ug/dL — ABNORMAL LOW (ref 45–182)
Saturation Ratios: 17 % — ABNORMAL LOW (ref 17.9–39.5)
TIBC: 258 ug/dL (ref 250–450)
UIBC: 215 ug/dL

## 2019-08-27 LAB — VITAMIN B12: Vitamin B-12: 783 pg/mL (ref 180–914)

## 2019-08-27 LAB — MRSA PCR SCREENING: MRSA by PCR: NEGATIVE

## 2019-08-27 LAB — CBG MONITORING, ED: Glucose-Capillary: 268 mg/dL — ABNORMAL HIGH (ref 70–99)

## 2019-08-27 LAB — RESPIRATORY PANEL BY RT PCR (FLU A&B, COVID)
Influenza A by PCR: NEGATIVE
Influenza B by PCR: NEGATIVE
SARS Coronavirus 2 by RT PCR: NEGATIVE

## 2019-08-27 LAB — LIPASE, BLOOD: Lipase: 54 U/L — ABNORMAL HIGH (ref 11–51)

## 2019-08-27 LAB — GLUCOSE, CAPILLARY: Glucose-Capillary: 302 mg/dL — ABNORMAL HIGH (ref 70–99)

## 2019-08-27 LAB — BRAIN NATRIURETIC PEPTIDE: B Natriuretic Peptide: 120.5 pg/mL — ABNORMAL HIGH (ref 0.0–100.0)

## 2019-08-27 MED ORDER — LABETALOL HCL 5 MG/ML IV SOLN
10.0000 mg | INTRAVENOUS | Status: DC | PRN
  Filled 2019-08-27: qty 4

## 2019-08-27 MED ORDER — ACETAMINOPHEN 325 MG PO TABS: 650.0000 mg | ORAL_TABLET | ORAL | Status: DC | PRN

## 2019-08-27 MED ORDER — METOPROLOL TARTRATE 5 MG/5ML IV SOLN
5.0000 mg | Freq: Once | INTRAVENOUS | Status: AC
  Administered 2019-08-27: 10:00:00 5 mg via INTRAVENOUS
  Filled 2019-08-27: qty 5

## 2019-08-27 MED ORDER — POLYETHYLENE GLYCOL 3350 17 G PO PACK: 17.0000 g | PACK | Freq: Every day | ORAL | Status: DC | PRN

## 2019-08-27 MED ORDER — NICARDIPINE HCL IN NACL 20-0.86 MG/200ML-% IV SOLN
3.0000 mg/h | INTRAVENOUS | Status: DC
  Administered 2019-08-27: 5 mg/h via INTRAVENOUS
  Filled 2019-08-27: qty 200

## 2019-08-27 MED ORDER — ONDANSETRON HCL 4 MG/2ML IJ SOLN: 4.0000 mg | Freq: Four times a day (QID) | INTRAMUSCULAR | Status: DC | PRN

## 2019-08-27 MED ORDER — ENOXAPARIN SODIUM 40 MG/0.4ML ~~LOC~~ SOLN
40.0000 mg | SUBCUTANEOUS | Status: DC
  Administered 2019-08-27: 16:00:00 40 mg via SUBCUTANEOUS
  Filled 2019-08-27: qty 0.4

## 2019-08-27 MED ORDER — LISINOPRIL 20 MG PO TABS: 40.0000 mg | ORAL_TABLET | Freq: Every day | ORAL | Status: DC

## 2019-08-27 MED ORDER — CARVEDILOL 12.5 MG PO TABS: 12.5000 mg | ORAL_TABLET | Freq: Two times a day (BID) | ORAL | Status: DC

## 2019-08-27 MED ORDER — CARVEDILOL 12.5 MG PO TABS
12.5000 mg | ORAL_TABLET | Freq: Two times a day (BID) | ORAL | Status: DC
  Administered 2019-08-28: 08:00:00 12.5 mg via ORAL
  Filled 2019-08-27: qty 1

## 2019-08-27 MED ORDER — ATORVASTATIN CALCIUM 40 MG PO TABS
80.0000 mg | ORAL_TABLET | Freq: Every day | ORAL | Status: DC
  Administered 2019-08-27 – 2019-08-28 (×2): 80 mg via ORAL
  Filled 2019-08-27: qty 1
  Filled 2019-08-27: qty 2

## 2019-08-27 MED ORDER — TETRACAINE HCL 0.5 % OP SOLN
2.0000 [drp] | Freq: Once | OPHTHALMIC | Status: AC
  Administered 2019-08-27: 09:00:00 2 [drp] via OPHTHALMIC
  Filled 2019-08-27: qty 4

## 2019-08-27 MED ORDER — LISINOPRIL 40 MG PO TABS
40.0000 mg | ORAL_TABLET | Freq: Every day | ORAL | Status: DC
  Administered 2019-08-28: 09:00:00 40 mg via ORAL
  Filled 2019-08-27: qty 1

## 2019-08-27 MED ORDER — DOCUSATE SODIUM 100 MG PO CAPS: 100.0000 mg | ORAL_CAPSULE | Freq: Two times a day (BID) | ORAL | Status: DC | PRN

## 2019-08-27 MED ORDER — ONDANSETRON 4 MG PO TBDP
4.0000 mg | ORAL_TABLET | Freq: Once | ORAL | Status: AC | PRN
  Administered 2019-08-27: 4 mg via ORAL
  Filled 2019-08-27: qty 1

## 2019-08-27 MED ORDER — NICARDIPINE HCL IN NACL 20-0.86 MG/200ML-% IV SOLN
3.0000 mg/h | INTRAVENOUS | Status: DC
  Administered 2019-08-27: 22:00:00 7.5 mg/h via INTRAVENOUS
  Administered 2019-08-27: 16:00:00 2.5 mg/h via INTRAVENOUS
  Administered 2019-08-27: 18:00:00 5 mg/h via INTRAVENOUS
  Administered 2019-08-28 (×3): 7.5 mg/h via INTRAVENOUS
  Filled 2019-08-27 (×8): qty 200

## 2019-08-27 MED ORDER — LISINOPRIL 20 MG PO TABS
40.0000 mg | ORAL_TABLET | Freq: Once | ORAL | Status: AC
  Administered 2019-08-27: 10:00:00 40 mg via ORAL
  Filled 2019-08-27: qty 2

## 2019-08-27 MED ORDER — PANTOPRAZOLE SODIUM 40 MG PO TBEC
40.0000 mg | DELAYED_RELEASE_TABLET | Freq: Every day | ORAL | Status: DC
  Administered 2019-08-27 – 2019-08-28 (×2): 40 mg via ORAL
  Filled 2019-08-27 (×2): qty 1

## 2019-08-27 MED ORDER — CHLORHEXIDINE GLUCONATE CLOTH 2 % EX PADS
6.0000 | MEDICATED_PAD | Freq: Every day | CUTANEOUS | Status: DC
  Administered 2019-08-28: 6 via TOPICAL

## 2019-08-27 MED ORDER — FLUORESCEIN SODIUM 1 MG OP STRP
ORAL_STRIP | OPHTHALMIC | Status: AC
  Filled 2019-08-27: qty 1

## 2019-08-27 MED ORDER — INSULIN GLARGINE 100 UNIT/ML ~~LOC~~ SOLN
20.0000 [IU] | Freq: Every day | SUBCUTANEOUS | Status: DC
  Administered 2019-08-27: 22:00:00 20 [IU] via SUBCUTANEOUS
  Filled 2019-08-27 (×2): qty 0.2

## 2019-08-27 NOTE — Progress Notes (Signed)
  Echocardiogram 2D Echocardiogram has been performed.  Leta Jungling M 08/27/2019, 2:48 PM

## 2019-08-27 NOTE — ED Notes (Signed)
Attempted report x1. 

## 2019-08-27 NOTE — ED Provider Notes (Addendum)
[place] EMERGENCY DEPARTMENT Provider Note   CSN: [id number] Arrival date & time: 08/27/19  0800     History Chief Complaint  Patient presents with  . Hypertension  . Emesis    Randy [name] is a 39 y.o. male.  HPI    39 year old male history of hypertension, type 2 diabetes, hyperlipidemia presents today complaining of left eye pressure, headache, nausea, and vomiting.  He reports he was in his usual state of health when he awoke at 6 AM today.  He was stating he began having pressure in his left eye.  His wife took his blood pressure and his systolic was 505.  He then became nauseated and vomited and she rechecked it and it was again significantly elevated at 198.  EMS was called and patient was transferred to hospital.  He reports no interventions in route but he did vomit again.  He states that he has had similar symptoms before with his blood pressure being out of control.  He reports he has been taking his medications as prescribed.  He normally takes them at breakfast in the morning.  He has not had them since yesterday morning as he got sick and did not have breakfast this morning.  He denies any history of intracranial hemorrhage, other bleeding diatheses, clots, fever, neck pain, chest pain, cough, dyspnea, abdominal pain, or changes in bowel habits, or chills. Nurse triage note reviewed Revert reviewed patient's last primary care visit with Randy [name] nurse practitioner.  She reported that the patient's fasting average blood sugar was 1 20-1 60 with postprandial averages 200s. She reports that his blood pressure readings had been systolically 1 39-7 85 and diastolically 67-3 20 during the last 2 visits.  Past Medical History:  Diagnosis Date  . Hypertension   . Pancreatitis   . Type II diabetes mellitus [facility])     Patient Active Problem List   Diagnosis Date Noted  . Hyperlipidemia 06/01/2019  . Microalbuminuria due to type 2 diabetes mellitus  ([place]) 06/01/2019  . Essential hypertension 05/31/2019  . Intractable nausea and vomiting 08/31/2015  . Hypertensive urgency 08/31/2015  . Uncontrolled diabetes mellitus ([place]) 08/31/2015  . Marijuana abuse 08/31/2015  . Noncompliance with medication regimen 08/31/2015    Past Surgical History:  Procedure Laterality Date  . NO PAST SURGERIES         Family History  Problem Relation Age of Onset  . Hypertension Mother   . Diabetes Father   . Hypertension Father     Social History   Tobacco Use  . Smoking status: Former Smoker    Packs/day: 0.33    Years: 17.00    Pack years: 5.61    Types: Cigarettes    Quit date: 12/26/2011    Years since quitting: 7.6  . Smokeless tobacco: Never Used  Substance Use Topics  . Alcohol use: No  . Drug use: Yes    Types: Marijuana    Comment: 08/31/2015 "frequent times/day"    Home Medications Prior to Admission medications   Medication Sig Start Date End Date Taking? Authorizing Provider  atorvastatin (LIPITOR) 80 MG tablet Take 1 tablet (80 mg total) by mouth daily. 07/28/19   Gildardo Pounds, NP  Blood Glucose Monitoring Suppl (TRUE METRIX METER) w/Device KIT Use to check blood sugar TID. E11.65 06/10/19   Charlott Rakes, MD  carvedilol (COREG) 12.5 MG tablet Take 1 tablet (12.5 mg total) by mouth 2 (two) times daily with a meal. 07/28/19  Gildardo Pounds, NP  glucose blood test strip Use as instructed to check TID. E11.65 06/10/19   Charlott Rakes, MD  insulin glargine (LANTUS) 100 UNIT/ML Solostar Pen Inject 18 Units into the skin daily. 07/28/19 10/26/19  Gildardo Pounds, NP  Insulin Pen Needle (TRUEPLUS 5-BEVEL PEN NEEDLES) 32G X 4 MM MISC Use to inject insulin daily. 06/10/19   Charlott Rakes, MD  lisinopril (ZESTRIL) 40 MG tablet Take 1 tablet (40 mg total) by mouth daily. 07/28/19   Gildardo Pounds, NP  TRUEplus Lancets 28G MISC Use to check blood sugar three times daily. E11.65 06/10/19   Charlott Rakes, MD    Allergies      Patient has no known allergies.  Review of Systems   Review of Systems  All other systems reviewed and are negative.   Physical Exam Updated Vital Signs BP (!) 198/110 (BP Location: Left Arm)   Pulse 71   Temp 98.2 F (36.8 C) (Oral)   Resp 16   Ht 1.702 m ('5\' 7"' )   Wt 89.8 kg   SpO2 98%   BMI 31.01 kg/m   Physical Exam Vitals and nursing note reviewed.  Constitutional:      General: He is not in acute distress.    Appearance: Normal appearance. He is obese. He is not ill-appearing.  HENT:     Head: Normocephalic and atraumatic.     Right Ear: External ear normal.     Left Ear: External ear normal.     Nose: Nose normal.     Mouth/Throat:     Mouth: Mucous membranes are moist.  Eyes:     Extraocular Movements: Extraocular movements intact.     Conjunctiva/sclera: Conjunctivae normal.     Pupils: Pupils are equal, round, and reactive to light.  Cardiovascular:     Rate and Rhythm: Normal rate and regular rhythm.  Pulmonary:     Effort: Pulmonary effort is normal.     Breath sounds: Normal breath sounds.  Abdominal:     General: Abdomen is flat. Bowel sounds are normal.     Palpations: Abdomen is soft.  Musculoskeletal:        General: No swelling or tenderness. Normal range of motion.     Cervical back: Normal range of motion and neck supple.     Right lower leg: No edema.     Left lower leg: No edema.  Skin:    General: Skin is warm and dry.     Capillary Refill: Capillary refill takes less than 2 seconds.  Neurological:     General: No focal deficit present.     Mental Status: He is alert and oriented to person, place, and time.     Cranial Nerves: No cranial nerve deficit.     Sensory: No sensory deficit.     Motor: No weakness.     Coordination: Coordination normal.     Deep Tendon Reflexes: Reflexes normal.  Psychiatric:        Mood and Affect: Mood normal.        Behavior: Behavior normal.        Thought Content: Thought content normal.     ED  Results / Procedures / Treatments   Labs (all labs ordered are listed, but only abnormal results are displayed) Labs Reviewed  CBC - Abnormal; Notable for the following components:      Result Value   RBC 4.19 (*)    Hemoglobin 11.3 (*)    HCT 36.1 (*)  All other components within normal limits  CBG MONITORING, ED - Abnormal; Notable for the following components:   Glucose-Capillary 268 (*)    All other components within normal limits  LIPASE, BLOOD  COMPREHENSIVE METABOLIC PANEL  URINALYSIS, ROUTINE W REFLEX MICROSCOPIC   Labs reviewed Hyperglycemia noted Elevated lipase at 1504 noted Hemoglobin slightly low at 11.3 EKG EKG Interpretation  Date/Time:  Friday August 27 2019 09:17:35 EDT Ventricular Rate:  69 PR Interval:    QRS Duration: 82 QT Interval:  393 QTC Calculation: 421 R Axis:   55 Text Interpretation: Sinus rhythm Consider left ventricular hypertrophy Confirmed by Pattricia Boss [phone]) on 08/27/2019 10:22:42 AM EKG personally reviewed and interpreted by me  Radiology CT Head Wo Contrast  Result Date: 08/27/2019 CLINICAL DATA:  Acute headache with normal neurologic exam. Headache and emesis last night. EXAM: CT HEAD WITHOUT CONTRAST TECHNIQUE: Contiguous axial images were obtained from the base of the skull through the vertex without intravenous contrast. COMPARISON:  07/22/2019 FINDINGS: Brain: No evidence of acute infarction, hemorrhage, hydrocephalus, extra-axial collection or mass lesion/mass effect. Vascular: No hyperdense vessel or unexpected calcification. Skull: Normal. Negative for fracture or focal lesion. Sinuses/Orbits: Orbits are normal. Paranasal sinuses are well developed with minimal mucosal membrane thickening over the left maxillary, ethmoid and frontal sinus. Other: None. IMPRESSION: No acute findings. Electronically Signed   By: Marin Olp M.D.   On: 08/27/2019 10:41   CT head personally reviewed and radiology interpretation reviewed by  me Procedures .Critical Care Performed by: Pattricia Boss, MD Authorized by: Pattricia Boss, MD   Critical care provider statement:    Critical care time (minutes):  45   Critical care was necessary to treat or prevent imminent or life-threatening deterioration of the following conditions:  CNS failure or compromise   Critical care was time spent personally by me on the following activities:  Discussions with consultants, evaluation of patient's response to treatment, examination of patient, ordering and performing treatments and interventions, ordering and review of laboratory studies, ordering and review of radiographic studies, pulse oximetry, re-evaluation of patient's condition, obtaining history from patient or surrogate and review of old charts   (including critical care time)  Medications Ordered in ED Medications  tetracaine (PONTOCAINE) 0.5 % ophthalmic solution 2 drop (has no administration in time range)  ondansetron (ZOFRAN-ODT) disintegrating tablet 4 mg (4 mg Oral Given 08/27/19 0350)    ED Course  I have reviewed the triage vital signs and the nursing notes.  Pertinent labs & imaging results that were available during my care of the patient were reviewed by me and considered in my medical decision making (see chart for details).    MDM Rules/Calculators/A&P                     Differential diagnosis-intracranial etiology of headache with hypertension, hypertensive urgency/emergency with headache and encephalopathy, acute eye conditions such as acute angle closure glaucoma, other intracranial normality such as bleeding or infection Patient with hypertension and left eye pain and headache.  Head CT negative.  IOP measured at 12- BP continued to elevate to 210.  Cardene drip ordered and patient bp down to 150 with improved headache.  Critical care consulted and will see  1:14 PM Pressure 158/100 on Cardene drip.  Patient with headache improved  Final Clinical Impression(s) /  ED Diagnoses Final diagnoses:  Hypertensive urgency  Nonintractable headache, unspecified chronicity pattern, unspecified headache type    Rx / DC Orders ED Discharge Orders  None       Pattricia Boss, MD 08/27/19 1348    Pattricia Boss, MD 08/27/19 1351

## 2019-08-27 NOTE — ED Triage Notes (Signed)
Per [facility] pt c/o headache and emesis onset of last night. Reports pain directly behind left eye. Reports taking medication as prescribed. One episode of emesis last night and one PTA. No neuro deficits noted in triage.

## 2019-08-27 NOTE — H&P (Signed)
NAMEDavaun [name], MRN:  [id number], DOB:  12/02/1980, LOS: 0 ADMISSION DATE:  08/27/2019, CONSULTATION DATE:  08/27/2019 REFERRING MD:  Dr. Jeanell Sparrow, EDP, CHIEF COMPLAINT:  Left eye pressure   Brief History    Mr. Randy [name] [name] is a 39 year old male with a past medical history of hypertension, type 2 diabetes, hyperlipidemia presents to the ED with complaints of left eye pressure/discomfort currently admitted for hypertensive emergency.  History of present illness    Mr. Randy [name] [name] is a 39 year old male with a past medical history of hypertension, type 2 diabetes, hyperlipidemia presents to the ED with complaints of left eye pressure/discomfort currently admitted for hypertensive emergency.  Mr. Randy [name] [name] states Randy [name] [name] was in his normal state of health till this a.m., when Randy [name] [name] woke up with left eye discomfort/pressure that Randy [name] [name] typically develops when his blood pressure is high.  Randy [name] [name] asked his wife, a Designer, jewellery, to check his blood pressure and they found his SBP to be 198. Randy [name] [name] then developed sudden nausea with vomiting. At that time they called EMS.  Randy [name] [name] had an additional episode of vomiting in route to the ED. Patient does endorse approximately 3-week history of increased urinary frequency but endorses taking his nighttime Lantus as prescribed.  Randy [name] [name] also endorses recently starting to smoke a new CBD leaf with tobacco as well approximately 1 week ago, last use was yesterday. Randy [name] [name] denies any other drug use, including meth or cocaine. Randy [name] [name] denies any alcohol use. Randy [name] [name] denies any sick contacts or recent COVID-19 exposure; Randy [name] [name] has received his vaccination.   Mr. Randy [name] [name] denies any dizziness, headache, vision loss, shortness of breath, chest pain, hematemesis, abdominal pain, diarrhea, hematochezia, dysuria, hematuria, lower extremity swelling, rashes or bruises.   Randy [name] [name] notes that Randy [name] [name] is on Lisinopril and Coreg at home but has only been taking Coreg once a day as Randy [name] [name] did not know it was BID.   Past Medical History    Past Medical History:  Diagnosis Date  . Hypertension   . Pancreatitis   . Type II diabetes mellitus ([place])    Significant Hospital Events   4/16: Admitted   Consults:  None   Procedures:  None   Significant Diagnostic Tests:   Ct Head: (4/16) Negative for acute intracranial findings   Micro Data:  None   Antimicrobials:  None    Interim history/subjective:  See above.   Objective   Blood pressure (!) 148/96, pulse 77, temperature 98.2 F (36.8 C), temperature source Oral, resp. rate 12, height _0  (1.702 m), weight 89.8 kg, SpO2 100 %.       No intake or output data in the 24 hours ending 08/27/19 1213 Filed Weights   08/27/19 0811  Weight: 89.8 kg   Physical Exam Vitals and nursing note reviewed.  Constitutional:      General: Randy [name] [name] is not in acute distress.    Appearance: Randy [name] [name] is obese. Randy [name] [name] is not ill-appearing or toxic-appearing.  HENT:     Head: Normocephalic and atraumatic.     Mouth/Throat:     Mouth: Mucous membranes are moist.     Pharynx: Oropharynx is clear.  Eyes:     General: No visual field deficit or scleral icterus.       Right eye: No discharge.        Left eye: No discharge.     Extraocular Movements: Extraocular movements intact.     Conjunctiva/sclera: Conjunctivae normal.     Pupils: Pupils are equal, round, and reactive to light.  Cardiovascular:     Rate and Rhythm: Normal rate and regular rhythm.     Pulses: Normal pulses.     Heart sounds: No murmur. No gallop.   Pulmonary:     Effort: Pulmonary effort is normal. No respiratory distress.     Breath sounds: Normal breath sounds. No wheezing, rhonchi or rales.  Abdominal:     General: Bowel sounds are normal. There is no distension.     Palpations: Abdomen is soft.     Tenderness: There is no abdominal tenderness. There is no guarding.  Musculoskeletal:     Right lower leg: 1+ Pitting Edema present.     Left lower leg: 1+ Pitting Edema present.  Skin:    General: Skin is warm  and dry.     Coloration: Skin is not jaundiced.     Findings: No bruising, erythema, lesion or rash.  Neurological:     General: No focal deficit present.     Mental Status: Randy [name] [name] is alert and oriented to person, place, and time. Mental status is at baseline.     Cranial Nerves: No dysarthria or facial asymmetry.     Sensory: Sensation is intact.     Motor: Motor function is intact.  Psychiatric:        Mood and Affect: Mood normal.        Behavior: Behavior normal.    Resolved Hospital Problem list   N/A  Assessment & Plan:   # Hypertensive Emergency  # Hypertensive Encephalopathy  Likely trigger is CBD/tobacco use. No evidence of acute intracranial abnormality on imaging. Symptoms are resolved at this time, so unlikely to benefit from MRI to look for intracranial hypertension. No evidence of pulmonary or renal end organ damage. EKG without ischemic changes but notable for LVH.   - Telemetry - Nicardipine gtt, titrate for MAP of 100-110 (or SBP 140-160)  - TTE - Trend Troponins  - BMP in the AM   # Type 2 Diabetes Mellitus Uncontrolled, last A1c was 11.7.  - SSI (moderate)  - Lantus 20 units at bedtime   # Normocytic Anemia  History of anemia since 2019. No recent history of bleeding. Given low total protein, may be due to nutritional deficits.    - Iron panel, Ferritin - B12  - Folate   # Elevated Alkaline Phosphatase # Elevated Lipase  AST/ALT and T. Bili are WNL. No abdominal tenderness. CT abdomen in January 2021 did not show any hepatic, biliary or pancreatic abnormalities. Recommend outpatient follow up.   Best practice:  Diet: HH/Carb modified Pain/Anxiety/Delirium protocol (if indicated): N/A VAP protocol (if indicated): N/A DVT prophylaxis: Lovenox GI prophylaxis: Protonix PO  Glucose control: SSI + Lantus  Mobility: OOB as tolerated Code Status: FULL Family Communication: Patient updated wife via telephone when provider at bedside Disposition: ICU   Labs   CBC: Recent Labs  Lab 08/27/19 0815  WBC 8.4  HGB 11.3*  HCT 36.1*  MCV 86.2  PLT 951    Basic Metabolic Panel: Recent Labs  Lab 08/27/19 0815  NA 135  K 5.0  CL 100  CO2 24  GLUCOSE 307*  BUN 16  CREATININE 0.95  CALCIUM 9.0   GFR: Estimated Creatinine Clearance: 110.5 mL/min (by C-G formula based on SCr of 0.95 mg/dL). Recent Labs  Lab 08/27/19 0815  WBC 8.4    Liver Function Tests: Recent Labs  Lab 08/27/19 0815  AST 31  ALT 33  ALKPHOS 180*  BILITOT 0.9  PROT 7.0  ALBUMIN 3.0*   Recent Labs  Lab 08/27/19 0815  LIPASE 54*   No results for input(s): AMMONIA in the last 168 hours.  ABG    Component Value Date/Time   HCO3 31.7 (H) 05/01/2018 0855   TCO2 34 (H) 05/01/2018 0855   ACIDBASEDEF 2.0 09/28/2017 1453   O2SAT 16.0 05/01/2018 0855     Coagulation Profile: No results for input(s): INR, PROTIME in the last 168 hours.  Cardiac Enzymes: No results for input(s): CKTOTAL, CKMB, CKMBINDEX, TROPONINI in the last 168 hours.  HbA1C: HbA1c POC (<> result, manual entry)  Date/Time Value Ref Range Status  05/31/2019 09:44 AM 12.8 4.0 - 5.6 % Final   Hgb A1c MFr Bld  Date/Time Value Ref Range Status  03/24/2018 10:07 AM 11.7 (H) 4.8 - 5.6 % Final    Comment:    (NOTE) Pre diabetes:          5.7%-6.4% Diabetes:              >6.4% Glycemic control for   <7.0% adults with diabetes   08/31/2015 07:21 PM 13.3 (H) 4.8 - 5.6 % Final    Comment:    (NOTE)         Pre-diabetes: 5.7 - 6.4         Diabetes: >6.4         Glycemic control for adults with diabetes: <7.0     CBG: Recent Labs  Lab 08/27/19 0805  GLUCAP 268*    Review of Systems:   Negative except as noted above.   Past Medical History  Randy [name] [name],  has a past medical history of Hypertension, Pancreatitis, and Type II diabetes mellitus ([place]).   Surgical History    Past Surgical History:  Procedure Laterality Date  . NO PAST SURGERIES       Social History   reports  that Randy [name] [name] quit smoking about 7 years ago. His smoking use included cigarettes. Randy [name] [name] has a 5.61 pack-year smoking history. Randy [name] [name] has never used smokeless tobacco. Randy [name] [name] reports current drug use. Drug: Marijuana. Randy [name] [name] reports that Randy [name] [name] does not drink alcohol.   Family History   His family history includes Diabetes in his father; Hypertension in his father and mother.   Allergies No Known Allergies   Home Medications  Prior to Admission medications   Medication Sig Start Date End Date Taking? Authorizing Provider  atorvastatin (LIPITOR) 80 MG tablet Take 1 tablet (80 mg total) by mouth daily. 07/28/19  Yes Gildardo Pounds, NP  carvedilol (COREG) 12.5 MG tablet Take 1 tablet (12.5 mg total) by mouth 2 (two) times daily with a meal. 07/28/19  Yes Gildardo Pounds, NP  cholecalciferol (VITAMIN D3) 25 MCG (1000 UNIT) tablet Take 1,000 Units by mouth daily.   Yes [provider]  insulin glargine (LANTUS) 100 UNIT/ML Solostar Pen Inject 18 Units into the skin daily. Patient taking differently: Inject 20 Units into the skin daily.  07/28/19 10/26/19 Yes Gildardo Pounds, NP  lisinopril (ZESTRIL) 40 MG tablet Take 1 tablet (40 mg total) by mouth daily. 07/28/19  Yes Gildardo Pounds, NP  Blood Glucose Monitoring Suppl (TRUE METRIX METER) w/Device KIT Use to check blood sugar TID. E11.65 06/10/19   Charlott Rakes, MD  glucose blood test strip Use as instructed to check TID. E11.65 06/10/19   Charlott Rakes, MD  Insulin Pen Needle (TRUEPLUS 5-BEVEL PEN NEEDLES) 32G X 4 MM MISC Use to inject insulin daily. 06/10/19   Charlott Rakes, MD  TRUEplus  Lancets 28G MISC Use to check blood sugar three times daily. E11.65 06/10/19   Charlott Rakes, MD    Dr. Jose Persia Internal Medicine PGY-1  Pager: [phone] 08/27/2019, 12:14 PM

## 2019-08-28 DIAGNOSIS — I161 Hypertensive emergency: Secondary | ICD-10-CM

## 2019-08-28 LAB — CBC
HCT: 37.2 % — ABNORMAL LOW (ref 39.0–52.0)
Hemoglobin: 11.9 g/dL — ABNORMAL LOW (ref 13.0–17.0)
MCH: 27 pg (ref 26.0–34.0)
MCHC: 32 g/dL (ref 30.0–36.0)
MCV: 84.5 fL (ref 80.0–100.0)
Platelets: 306 10*3/uL (ref 150–400)
RBC: 4.4 MIL/uL (ref 4.22–5.81)
RDW: 13 % (ref 11.5–15.5)
WBC: 8 10*3/uL (ref 4.0–10.5)
nRBC: 0 % (ref 0.0–0.2)

## 2019-08-28 LAB — MAGNESIUM: Magnesium: 1.8 mg/dL (ref 1.7–2.4)

## 2019-08-28 LAB — BASIC METABOLIC PANEL
Anion gap: 8 (ref 5–15)
BUN: 15 mg/dL (ref 6–20)
CO2: 26 mmol/L (ref 22–32)
Calcium: 8.6 mg/dL — ABNORMAL LOW (ref 8.9–10.3)
Chloride: 100 mmol/L (ref 98–111)
Creatinine, Ser: 0.99 mg/dL (ref 0.61–1.24)
GFR calc Af Amer: >60 mL/min (ref >60)
GFR calc non Af Amer: >60 mL/min (ref >60)
Glucose, Bld: 233 mg/dL — ABNORMAL HIGH (ref 70–99)
Potassium: 3.8 mmol/L (ref 3.5–5.1)
Sodium: 134 mmol/L — ABNORMAL LOW (ref 135–145)

## 2019-08-28 LAB — GLUCOSE, CAPILLARY
Glucose-Capillary: 222 mg/dL — ABNORMAL HIGH (ref 70–99)
Glucose-Capillary: 270 mg/dL — ABNORMAL HIGH (ref 70–99)

## 2019-08-28 LAB — HEMOGLOBIN A1C
Hgb A1c MFr Bld: 9.9 % — ABNORMAL HIGH (ref 4.8–5.6)
Mean Plasma Glucose: 237.43 mg/dL

## 2019-08-28 MED ORDER — INSULIN GLARGINE 100 UNIT/ML SOLOSTAR PEN: 20.0000 [IU] | PEN_INJECTOR | Freq: Every day | SUBCUTANEOUS | Status: DC

## 2019-08-28 MED ORDER — LIVING WELL WITH DIABETES BOOK
Freq: Once | Status: AC
  Filled 2019-08-28: qty 1

## 2019-08-28 MED ORDER — INSULIN ASPART 100 UNIT/ML ~~LOC~~ SOLN
0.0000 [IU] | Freq: Three times a day (TID) | SUBCUTANEOUS | Status: DC
  Administered 2019-08-28: 5 [IU] via SUBCUTANEOUS

## 2019-08-28 MED ORDER — INSULIN ASPART 100 UNIT/ML ~~LOC~~ SOLN: 0.0000 [IU] | Freq: Every day | SUBCUTANEOUS | Status: DC

## 2019-08-28 NOTE — Progress Notes (Signed)
PT discharged home with all belongings.

## 2019-08-28 NOTE — Plan of Care (Signed)

## 2019-08-28 NOTE — Progress Notes (Signed)
Inpatient Diabetes Program Recommendations  AACE/ADA: New Consensus Statement on Inpatient Glycemic Control (2015)  Target Ranges:  Prepandial:   less than 140 mg/dL      Peak postprandial:   less than 180 mg/dL (1-2 hours)      Critically ill patients:  140 - 180 mg/dL   Lab Results  Component Value Date   GLUCAP 222 (H) 08/28/2019   HGBA1C 12.8 05/31/2019    Review of Glycemic Control  Diabetes history: DM2 Outpatient Diabetes medications: Lantus 20 units Current orders for Inpatient glycemic control: Lantus 20 units q hs  Inpatient Diabetes Program Recommendations:   -Glycemic control order set tid + hs with sensitive correction tid + hs 0-5 units and adjust as needed -May need Novolog 3 units tid meal coverage if eats 50%  Thank you, Darel Hong E. Fawnda Vitullo, RN, MSN, CDE  Diabetes Coordinator Inpatient Glycemic Control Team Team Pager [phone] (8am-5pm) 08/28/2019 9:38 AM  -

## 2019-08-28 NOTE — Discharge Summary (Signed)
Discharge Summary  Randy [name] URK:[id number] DOB: 1981-03-07  PCP: Nicolette Bang, DO  Admit date: 08/27/2019 Discharge date: 08/28/2019  Time spent: 40 mins  Recommendations for Outpatient Follow-up:  1. Follow-up with PCP in 1 week with repeat labs, blood glucose checks and close monitoring of blood pressure  Discharge Diagnoses:  Active Hospital Problems   Diagnosis Date Noted  . Hypertensive emergency 08/27/2019    Resolved Hospital Problems  No resolved problems to display.    Discharge Condition: Stable  Diet recommendation: Heart healthy/modified carbs  Vitals:   08/28/19 1215 08/28/19 1230  BP: (!) 142/87 139/72  Pulse: 72 82  Resp: 17 15  Temp:    SpO2: 100% 99%    History of present illness:  39 year old male with past medical history significant for hypertension, diabetes mellitus type 2, admitted with hypertensive crisis. Initial blood pressure recorded as 198 /110 with a MAP of 133, complaining of left retro-orbital headache, nausea and one episode of vomiting. He is supposed to be on lisinopril and Coreg but only taking this once daily. Pt is back to smoking tobacco with THC and attributes this is the cause of his hypertension. Head CT negative,  EKG with LVH criteria, no acute ST changes, troponin negative, UDS positive for THC. Patient subsequently admitted to the ICU, started on Cardene drip with improvement in his BP.  Cardene drip was titrated off on 08/28/2019 and patient was placed on his home regimen of lisinopril 40, Coreg 12.5 twice daily with stabilization of his BP.  [facility] hospitalist assumed care on 08/28/2019.     Today, patient denies any new complaints, denies any chest pain, shortness of breath, abdominal pain, any further nausea/vomiting, headaches, fever/chills.  BP has stabilized on home regimen.  Patient very eager to be discharged.  Extensive discussion with patient and wife at bedside about compliance with medication, diet and  exercise was held, patient verbalized understanding.  Patient to follow-up with PCP in 1 week.   Hospital Course:  Active Problems:   Hypertensive emergency   Hypertensive crisis Resolved Status post Cardene drip Head CT negative, EKG with LVH criteria, no acute ST changes, troponin negative, Resume home lisinopril, Coreg Follow-up with PCP in 1 week  Diabetes mellitus type 2 Last A1c 9.9, improved from previous Followed by PCP, currently only on Lantus 20 units daily, recently discontinued his glipizide Follow-up with PCP in 1 week  Hyperlipidemia Continue statins  Iron deficiency anemia Denies any recent surgery, or any bleeding Anemia panel showed iron 43, sat 17 Advised to eat iron rich diet, if no improvement, may resume oral iron supplementation May need colonoscopy at age 28  Polysubstance abuse Advised to quit tobacco Advised to abstain from marijuana          Malnutrition Type:      Malnutrition Characteristics:      Nutrition Interventions:      Estimated body mass index is 30.59 kg/m as calculated from the following:   Height as of this encounter: '5\' 7"'  (1.702 m).   Weight as of this encounter: 88.6 kg.    Procedures:  None  Consultations:  PCCM  Discharge Exam: BP 139/72   Pulse 82   Temp 98.3 F (36.8 C) (Oral)   Resp 15   Ht '5\' 7"'  (1.702 m)   Wt 88.6 kg   SpO2 99%   BMI 30.59 kg/m    General: NAD Cardiovascular: S1, S2 present Respiratory: CTA B  Discharge Instructions You were cared for by  a hospitalist during your hospital stay. If you have any questions about your discharge medications or the care you received while you were in the hospital after you are discharged, you can call the unit and asked to speak with the hospitalist on call if the hospitalist that took care of you is not available. Once you are discharged, your primary care physician will handle any further medical issues. Please note that NO REFILLS for  any discharge medications will be authorized once you are discharged, as it is imperative that you return to your primary care physician (or establish a relationship with a primary care physician if you do not have one) for your aftercare needs so that they can reassess your need for medications and monitor your lab values.  Discharge Instructions    Diet - low sodium heart healthy   Complete by: As directed    Increase activity slowly   Complete by: As directed      Allergies as of 08/28/2019   No Known Allergies     Medication List    TAKE these medications   atorvastatin 80 MG tablet Commonly known as: LIPITOR Take 1 tablet (80 mg total) by mouth daily.   carvedilol 12.5 MG tablet Commonly known as: Coreg Take 1 tablet (12.5 mg total) by mouth 2 (two) times daily with a meal.   cholecalciferol 25 MCG (1000 UNIT) tablet Commonly known as: VITAMIN D3 Take 1,000 Units by mouth daily.   glucose blood test strip Use as instructed to check TID. E11.65   insulin glargine 100 UNIT/ML Solostar Pen Commonly known as: LANTUS Inject 20 Units into the skin daily.   lisinopril 40 MG tablet Commonly known as: ZESTRIL Take 1 tablet (40 mg total) by mouth daily.   True Metrix Meter w/Device Kit Use to check blood sugar TID. E11.65   TRUEplus 5-Bevel Pen Needles 32G X 4 MM Misc Generic drug: Insulin Pen Needle Use to inject insulin daily.   TRUEplus Lancets 28G Misc Use to check blood sugar three times daily. E11.65      No Known Allergies Follow-up Information    Nicolette Bang, DO. Schedule an appointment as soon as possible for a visit in 1 week(s).   Specialty: Family Medicine Contact information: [place] [address] [phone]            The results of significant diagnostics from this hospitalization (including imaging, microbiology, ancillary and laboratory) are listed below for reference.    Significant Diagnostic Studies: CT  Head Wo Contrast  Result Date: 08/27/2019 CLINICAL DATA:  Acute headache with normal neurologic exam. Headache and emesis last night. EXAM: CT HEAD WITHOUT CONTRAST TECHNIQUE: Contiguous axial images were obtained from the base of the skull through the vertex without intravenous contrast. COMPARISON:  07/22/2019 FINDINGS: Brain: No evidence of acute infarction, hemorrhage, hydrocephalus, extra-axial collection or mass lesion/mass effect. Vascular: No hyperdense vessel or unexpected calcification. Skull: Normal. Negative for fracture or focal lesion. Sinuses/Orbits: Orbits are normal. Paranasal sinuses are well developed with minimal mucosal membrane thickening over the left maxillary, ethmoid and frontal sinus. Other: None. IMPRESSION: No acute findings. Electronically Signed   By: Marin Olp M.D.   On: 08/27/2019 10:41   ECHOCARDIOGRAM COMPLETE  Result Date: 08/27/2019    ECHOCARDIOGRAM REPORT   Patient Name:   LAJARVIS Hollman Date of Exam: 08/27/2019 Medical Rec #:  [id number]     Height:       67.0 in Accession #:  [id number]    Weight:       198.0 lb Date of Birth:  09/13/1980     BSA:          2.014 m Patient Age:    39 years      BP:           153/97 mmHg Patient Gender: M             HR:           80 bpm. Exam Location:  Inpatient Procedure: 2D Echo Indications:    Abnormal ECG 794.31 / R94.31  History:        Patient has no prior history of Echocardiogram examinations.                 Risk Factors:Diabetes, Current Smoker, Hypertension and                 Dyslipidemia. Normocytic Anemia. Left eye pressure/discomfort                 currently admitted for hypertensive emergency.  Sonographer:    Darlina Sicilian RDCS Referring Phys: [place]  1. Left ventricular ejection fraction, by estimation, is 60 to 65%. The left ventricle has normal function. The left ventricle has no regional wall motion abnormalities. Left ventricular diastolic parameters were normal.  2. Right ventricular  systolic function is normal. The right ventricular size is normal.  3. The mitral valve is normal in structure. Trivial mitral valve regurgitation. No evidence of mitral stenosis.  4. The aortic valve is tricuspid. Aortic valve regurgitation is not visualized. No aortic stenosis is present.  5. The inferior vena cava is normal in size with greater than 50% respiratory variability, suggesting right atrial pressure of 3 mmHg. FINDINGS  Left Ventricle: Left ventricular ejection fraction, by estimation, is 60 to 65%. The left ventricle has normal function. The left ventricle has no regional wall motion abnormalities. The left ventricular internal cavity size was normal in size. There is  no left ventricular hypertrophy. Left ventricular diastolic parameters were normal. Right Ventricle: The right ventricular size is normal. No increase in right ventricular wall thickness. Right ventricular systolic function is normal. Left Atrium: Left atrial size was normal in size. Right Atrium: Right atrial size was normal in size. Pericardium: There is no evidence of pericardial effusion. Mitral Valve: The mitral valve is normal in structure. Normal mobility of the mitral valve leaflets. Trivial mitral valve regurgitation. No evidence of mitral valve stenosis. Tricuspid Valve: The tricuspid valve is normal in structure. Tricuspid valve regurgitation is not demonstrated. No evidence of tricuspid stenosis. Aortic Valve: The aortic valve is tricuspid. Aortic valve regurgitation is not visualized. No aortic stenosis is present. Pulmonic Valve: The pulmonic valve was normal in structure. Pulmonic valve regurgitation is not visualized. No evidence of pulmonic stenosis. Aorta: The aortic root is normal in size and structure. Venous: The inferior vena cava is normal in size with greater than 50% respiratory variability, suggesting right atrial pressure of 3 mmHg. IAS/Shunts: No atrial level shunt detected by color flow Doppler.  LEFT  VENTRICLE PLAX 2D LVIDd:         4.60 cm  Diastology LVIDs:         3.57 cm  LV e' lateral:   10.30 cm/s LV PW:         0.96 cm  LV E/e' lateral: 6.3 LV IVS:        0.98 cm  LV e'  medial:    7.51 cm/s LVOT diam:     2.00 cm  LV E/e' medial:  8.6 LV SV:         63 LV SV Index:   31 LVOT Area:     3.14 cm  RIGHT VENTRICLE RV S prime:     13.60 cm/s TAPSE (M-mode): 2.2 cm LEFT ATRIUM             Index       RIGHT ATRIUM           Index LA diam:        3.30 cm 1.64 cm/m  RA Area:     14.60 cm LA Vol (A2C):   37.0 ml 18.37 ml/m RA Volume:   30.90 ml  15.35 ml/m LA Vol (A4C):   33.6 ml 16.69 ml/m LA Biplane Vol: 38.0 ml 18.87 ml/m  AORTIC VALVE LVOT Vmax:   120.00 cm/s LVOT Vmean:  80.100 cm/s LVOT VTI:    0.199 m  AORTA Ao Root diam: 2.70 cm Ao Asc diam:  2.80 cm MITRAL VALVE MV Area (PHT): 3.93 cm    SHUNTS MV Decel Time: 193 msec    Systemic VTI:  0.20 m MV E velocity: 64.80 cm/s  Systemic Diam: 2.00 cm MV A velocity: 49.50 cm/s MV E/A ratio:  1.31 Jenkins Rouge MD Electronically signed by Jenkins Rouge MD Signature Date/Time: 08/27/2019/3:05:42 PM    Final     Microbiology: Recent Results (from the past 240 hour(s))  Respiratory Panel by RT PCR (Flu A&B, Covid) - Nasopharyngeal Swab     Status: None   Collection Time: 08/27/19  2:06 PM   Specimen: Nasopharyngeal Swab  Result Value Ref Range Status   SARS Coronavirus 2 by RT PCR NEGATIVE NEGATIVE Final    Comment: (NOTE) SARS-CoV-2 target nucleic acids are NOT DETECTED. The SARS-CoV-2 RNA is generally detectable in upper respiratoy specimens during the acute phase of infection. The lowest concentration of SARS-CoV-2 viral copies this assay can detect is 131 copies/mL. A negative result does not preclude SARS-Cov-2 infection and should not be used as the sole basis for treatment or other patient management decisions. A negative result may occur with  improper specimen collection/handling, submission of specimen other than nasopharyngeal swab,  presence of viral mutation(s) within the areas targeted by this assay, and inadequate number of viral copies (<131 copies/mL). A negative result must be combined with clinical observations, patient history, and epidemiological information. The expected result is Negative. Fact Sheet for Patients:  PinkCheek.be Fact Sheet for Healthcare Providers:  GravelBags.it This test is not yet ap proved or cleared by the [place] FDA and  has been authorized for detection and/or diagnosis of SARS-CoV-2 by FDA under an Emergency Use Authorization (EUA). This EUA will remain  in effect (meaning this test can be used) for the duration of the COVID-19 declaration under Section 564(b)(1) of the Act, 21 U.S.C. section 360bbb-3(b)(1), unless the authorization is terminated or revoked sooner.    Influenza A by PCR NEGATIVE NEGATIVE Final   Influenza B by PCR NEGATIVE NEGATIVE Final    Comment: (NOTE) The Xpert Xpress SARS-CoV-2/FLU/RSV assay is intended as an aid in  the diagnosis of influenza from Nasopharyngeal swab specimens and  should not be used as a sole basis for treatment. Nasal washings and  aspirates are unacceptable for Xpert Xpress SARS-CoV-2/FLU/RSV  testing. Fact Sheet for Patients: PinkCheek.be Fact Sheet for Healthcare Providers: GravelBags.it This test is not yet approved or cleared  by the [place] and  has been authorized for detection and/or diagnosis of SARS-CoV-2 by  FDA under an Emergency Use Authorization (EUA). This EUA will remain  in effect (meaning this test can be used) for the duration of the  Covid-19 declaration under Section 564(b)(1) of the Act, 21  U.S.C. section 360bbb-3(b)(1), unless the authorization is  terminated or revoked. Performed at [facility], [place] [address], [place], [place] [address]   MRSA PCR Screening     Status:  None   Collection Time: 08/27/19  5:22 PM   Specimen: Nasopharyngeal  Result Value Ref Range Status   MRSA by PCR NEGATIVE NEGATIVE Final    Comment:        The GeneXpert MRSA Assay (FDA approved for NASAL specimens only), is one component of a comprehensive MRSA colonization surveillance program. It is not intended to diagnose MRSA infection nor to guide or monitor treatment for MRSA infections. Performed at [facility], [place] [address], [place], [place] [address]      Labs: Basic Metabolic Panel: Recent Labs  Lab 08/27/19 0815 08/28/19 0418  NA 135 134*  K 5.0 3.8  CL 100 100  CO2 24 26  GLUCOSE 307* 233*  BUN 16 15  CREATININE 0.95 0.99  CALCIUM 9.0 8.6*  MG  --  1.8   Liver Function Tests: Recent Labs  Lab 08/27/19 0815  AST 31  ALT 33  ALKPHOS 180*  BILITOT 0.9  PROT 7.0  ALBUMIN 3.0*   Recent Labs  Lab 08/27/19 0815  LIPASE 54*   No results for input(s): AMMONIA in the last 168 hours. CBC: Recent Labs  Lab 08/27/19 0815 08/28/19 0418  WBC 8.4 8.0  HGB 11.3* 11.9*  HCT 36.1* 37.2*  MCV 86.2 84.5  PLT 332 306   Cardiac Enzymes: No results for input(s): CKTOTAL, CKMB, CKMBINDEX, TROPONINI in the last 168 hours. BNP: BNP (last 3 results) Recent Labs    08/27/19 1816  BNP 120.5*    ProBNP (last 3 results) No results for input(s): PROBNP in the last 8760 hours.  CBG: Recent Labs  Lab 08/27/19 0805 08/27/19 2001 08/28/19 0009 08/28/19 1145  GLUCAP 268* 302* 222* 270*       Signed:  Alma Friendly, MD [facility] 08/28/2019, 12:58 PM

## 2019-08-29 LAB — FOLATE RBC
Folate, Hemolysate: 301 ng/mL
Folate, RBC: 778 ng/mL (ref >498)
Hematocrit: 38.7 % (ref 37.5–51.0)

## 2019-08-30 ENCOUNTER — Telehealth: Payer: Self-pay

## 2019-08-30 NOTE — Telephone Encounter (Signed)
Transition Care Management Follow-up Telephone Call  Date of discharge and from where: 08/28/2019 , [facility]   How have you been since you were released from the hospital? He was concerned about scheduling a follow up with his PCP.   Any questions or concerns?  medication question  noted below.   Items Reviewed:  Did the pt receive and understand the discharge instructions provided? he said he did not receive them  Medications obtained and verified? he said that he has his medications and is taking them as ordered. He correctly stated the orders for lantus. He is taking  OTC Vitamin D3 and wants to ask his PCP if he is taking the correct vitamin D for a diabetic. He stated that he will be going to [place] at the end of the month and he wants to make sure he has enough lantus for his trip.      Any new allergies since your discharge? none reported   Other (ie: DME, Home Health, etc) no home health or DME ordered  Has glucometer but has not checked his blood sugar yet today.   Functional Questionnaire: (I = Independent and D = Dependent) ADL's: independent   Follow up appointments reviewed:    PCP Hospital f/u appt confirmed? scheduled an appointment with Dr Earlene Plater - 09/01/2019 @ 1050  Specialist Hospital f/u appt confirmed? .none scheduled at this time  If their condition worsens, is the pt aware to call  their PCP or go to the ED? yes  Was the patient provided with contact information for the PCP's office or ED? he has the phone number for the clinic  Was the pt encouraged to call back with questions or concerns?  yes

## 2019-08-31 ENCOUNTER — Telehealth: Payer: Self-pay

## 2019-08-31 NOTE — Telephone Encounter (Signed)

## 2019-09-01 ENCOUNTER — Other Ambulatory Visit: Payer: Self-pay

## 2019-09-01 ENCOUNTER — Ambulatory Visit (INDEPENDENT_AMBULATORY_CARE_PROVIDER_SITE_OTHER): Payer: Self-pay | Admitting: Internal Medicine

## 2019-09-01 ENCOUNTER — Encounter: Payer: Self-pay | Admitting: Internal Medicine

## 2019-09-01 VITALS — BP 158/93 | HR 74 | Temp 97.3°F | Resp 17 | Ht 67.0 in | Wt 196.0 lb

## 2019-09-01 DIAGNOSIS — E785 Hyperlipidemia, unspecified: Secondary | ICD-10-CM

## 2019-09-01 DIAGNOSIS — Z09 Encounter for follow-up examination after completed treatment for conditions other than malignant neoplasm: Secondary | ICD-10-CM

## 2019-09-01 DIAGNOSIS — R748 Abnormal levels of other serum enzymes: Secondary | ICD-10-CM

## 2019-09-01 DIAGNOSIS — E119 Type 2 diabetes mellitus without complications: Secondary | ICD-10-CM

## 2019-09-01 DIAGNOSIS — I160 Hypertensive urgency: Secondary | ICD-10-CM

## 2019-09-01 DIAGNOSIS — I10 Essential (primary) hypertension: Secondary | ICD-10-CM

## 2019-09-01 DIAGNOSIS — E1165 Type 2 diabetes mellitus with hyperglycemia: Secondary | ICD-10-CM

## 2019-09-01 LAB — GLUCOSE, POCT (MANUAL RESULT ENTRY): POC Glucose: 245 mg/dl — AB (ref 70–99)

## 2019-09-01 MED ORDER — INSULIN GLARGINE 100 UNIT/ML SOLOSTAR PEN: 22.0000 [IU] | PEN_INJECTOR | Freq: Every day | SUBCUTANEOUS | Status: DC

## 2019-09-01 MED ORDER — GLIPIZIDE 5 MG PO TABS: 5.0000 mg | ORAL_TABLET | Freq: Two times a day (BID) | ORAL | 3 refills | Status: DC

## 2019-09-01 MED ORDER — CARVEDILOL 25 MG PO TABS: 25.0000 mg | ORAL_TABLET | Freq: Two times a day (BID) | ORAL | 0 refills | Status: DC

## 2019-09-01 MED FILL — CARVEDILOL 25 MG TABLET: 25 | 30 days supply | Qty: 60 | Fill #0

## 2019-09-01 MED FILL — glipiZIDE 5 MG TABS: 5 | 30 days supply | Qty: 60 | Fill #0

## 2019-09-01 NOTE — Progress Notes (Signed)
Subjective:    Randy [name] - 39 y.o. male MRN [id number]  Date of birth: 1981/02/18  HPI  Randy [name] is here for hospital follow up. He was admitted from 4/16-4/17 for hypertensive crisis, s/p Cardene drip. He was instructed to resume home Lisinopril and Coreg. Patient reports he is feeling well.   Reports fasting CBGs 140-150s. He did eat prior to this office visit--ate a bowl of oatmeal. Wants to know what happened with his Glipizide, wants to restart. Intolerant of Metformin. Currently taking Lantus 22 units.   Reports he is checking BPs at home. He is typically been running 169 systolic and 90 diastolic at home. No headaches, chest pain, nausea, vomiting, vision changes.     Health Maintenance:  Health Maintenance Due  Topic Date Due  . OPHTHALMOLOGY EXAM  Never done  . COVID-19 Vaccine (1) Never done    -  reports that he quit smoking about 7 years ago. His smoking use included cigarettes. He has a 5.61 pack-year smoking history. He has never used smokeless tobacco. - Review of Systems: Per HPI. - Past Medical History: Patient Active Problem List   Diagnosis Date Noted  . Hypertensive emergency 08/27/2019  . Hyperlipidemia 06/01/2019  . Microalbuminuria due to type 2 diabetes mellitus ([place]) 06/01/2019  . Essential hypertension 05/31/2019  . Intractable nausea and vomiting 08/31/2015  . Hypertensive urgency 08/31/2015  . Uncontrolled diabetes mellitus ([place]) 08/31/2015  . Marijuana abuse 08/31/2015  . Noncompliance with medication regimen 08/31/2015   - Medications: reviewed and updated   Objective:   Physical Exam BP (!) 158/93   Pulse 74   Temp (!) 97.3 F (36.3 C) (Temporal)   Resp 17   Ht 5\' 7"  (1.702 m)   Wt 196 lb (88.9 kg)   SpO2 96%   BMI 30.70 kg/m  Physical Exam  Constitutional: He is oriented to person, place, and time and well-developed, well-nourished, and in no distress. No distress.  Cardiovascular: Normal rate.  Pulmonary/Chest: Effort  normal. No respiratory distress.  Musculoskeletal:        General: Normal range of motion.  Neurological: He is alert and oriented to person, place, and time.  Skin: Skin is warm and dry. He is not diaphoretic.  Psychiatric: Affect and judgment normal.           Assessment & Plan:   1. Hospital discharge follow-up  2. Hypertensive urgency Resolved. Asymptomatic. Better BP control has been obtained.   3. Uncontrolled type 2 diabetes mellitus with hyperglycemia (HCC) Recent A1c 9.9% on 08/28/19. CBG today (nonfasting) was 245. Discussed goal <7. Will add Glipizide 5 mg back. Hold Lantus at 22u with start of new medication to monitor for hypoglycemia. Fasting CBGs above goal but not significantly.  Counseled on Diabetic diet, my plate method, 678 minutes of moderate intensity exercise/week Blood sugar logs with fasting goals of 80-120 mg/dl, random of less than 180 and in the event of sugars less than 60 mg/dl or greater than 400 mg/dl encouraged to notify the clinic. Advised on the need for annual eye exams, annual foot exams, Pneumonia vaccine. - Glucose (CBG) - Comprehensive metabolic panel - glipiZIDE (GLUCOTROL) 5 MG tablet; Take 1 tablet (5 mg total) by mouth 2 (two) times daily before a meal.  Dispense: 60 tablet; Refill: 3  4. Elevated lipase Lipase level 54 at hospitalization. CT abdomen in January 2021 did not show any hepatic, biliary or pancreatic abnormalities. Patient asymptomatic at present. Repeat lipase.  - Lipase  5. Hyperlipidemia,  unspecified hyperlipidemia type Started on Atorvastatin 80 mg in Jan 2021 due to LDL 196. Repeat LDL. Reports compliance and denies side effects of statin.   - LDL Cholesterol, Direct  6. Essential hypertension BP above goal today but more optimally controlled. Will increase Coreg to 25 mg BID. Pulse 74. Will monitor at next visit in one month.  Counseled on blood pressure goal of less than 130/80, low-sodium, DASH diet, medication  compliance, 150 minutes of moderate intensity exercise per week. Discussed medication compliance, adverse effects. - Comprehensive metabolic panel - carvedilol (COREG) 25 MG tablet; Take 1 tablet (25 mg total) by mouth 2 (two) times daily with a meal.  Dispense: 180 tablet; Refill: 0    Marcy Siren, D.O. 09/01/2019, 11:03 AM Primary Care at [facility]

## 2019-09-02 LAB — COMPREHENSIVE METABOLIC PANEL
ALT: 20 IU/L (ref 0–44)
AST: 17 IU/L (ref 0–40)
Albumin/Globulin Ratio: 1.3 (ref 1.2–2.2)
Albumin: 3.4 g/dL — ABNORMAL LOW (ref 4.0–5.0)
Alkaline Phosphatase: 207 IU/L — ABNORMAL HIGH (ref 39–117)
BUN/Creatinine Ratio: 16 (ref 9–20)
BUN: 14 mg/dL (ref 6–24)
Bilirubin Total: 0.3 mg/dL (ref 0.0–1.2)
CO2: 26 mmol/L (ref 20–29)
Calcium: 8.5 mg/dL — ABNORMAL LOW (ref 8.7–10.2)
Chloride: 100 mmol/L (ref 96–106)
Creatinine, Ser: 0.89 mg/dL (ref 0.76–1.27)
GFR calc Af Amer: 124 mL/min/{1.73_m2} (ref >59)
GFR calc non Af Amer: 107 mL/min/{1.73_m2} (ref >59)
Globulin, Total: 2.7 g/dL (ref 1.5–4.5)
Glucose: 308 mg/dL — ABNORMAL HIGH (ref 65–99)
Potassium: 4.5 mmol/L (ref 3.5–5.2)
Sodium: 136 mmol/L (ref 134–144)
Total Protein: 6.1 g/dL (ref 6.0–8.5)

## 2019-09-02 LAB — LDL CHOLESTEROL, DIRECT: LDL Direct: 137 mg/dL — ABNORMAL HIGH (ref 0–99)

## 2019-09-02 LAB — LIPASE: Lipase: 63 U/L (ref 13–78)

## 2019-09-03 LAB — GAMMA GT: GGT: 143 IU/L — ABNORMAL HIGH (ref 0–65)

## 2019-09-03 LAB — SPECIMEN STATUS REPORT

## 2019-09-09 MED FILL — LISINOPRIL 40 MG TABLET: 40 | 30 days supply | Qty: 30 | Fill #2

## 2019-09-09 MED FILL — !LANTUS SOLOSTAR 100UNITS/M: 100 | 33 days supply | Qty: 6 | Fill #1

## 2019-09-28 ENCOUNTER — Telehealth: Payer: Self-pay

## 2019-09-28 NOTE — Telephone Encounter (Signed)

## 2019-09-29 ENCOUNTER — Encounter: Payer: Self-pay | Admitting: Internal Medicine

## 2019-09-29 ENCOUNTER — Encounter: Payer: Self-pay | Admitting: Gastroenterology

## 2019-09-29 ENCOUNTER — Other Ambulatory Visit: Payer: Self-pay

## 2019-09-29 ENCOUNTER — Ambulatory Visit (INDEPENDENT_AMBULATORY_CARE_PROVIDER_SITE_OTHER): Payer: Self-pay | Admitting: Internal Medicine

## 2019-09-29 VITALS — BP 134/91 | HR 71 | Temp 97.3°F | Resp 17 | Wt 198.0 lb

## 2019-09-29 DIAGNOSIS — R748 Abnormal levels of other serum enzymes: Secondary | ICD-10-CM

## 2019-09-29 DIAGNOSIS — E1165 Type 2 diabetes mellitus with hyperglycemia: Secondary | ICD-10-CM

## 2019-09-29 DIAGNOSIS — I10 Essential (primary) hypertension: Secondary | ICD-10-CM

## 2019-09-29 DIAGNOSIS — Z8719 Personal history of other diseases of the digestive system: Secondary | ICD-10-CM

## 2019-09-29 LAB — GLUCOSE, POCT (MANUAL RESULT ENTRY): POC Glucose: 182 mg/dl — AB (ref 70–99)

## 2019-09-29 NOTE — Progress Notes (Signed)
Subjective:    Randy [name] - 39 y.o. male MRN [id number]  Date of birth: 1980/05/30  HPI  Randy [name] is here for follow up of chronic medical conditions.  Additionally, he has concerns about pancreatitis. His back is hurting him x2 weeks and this typically occurs when he has a pancreatitis flare. Has not drank alcohol for 7 years. Has never seen GI. He is not vomiting. Able to tolerate PO intake.   Chronic HTN Disease Monitoring:  Home BP Monitoring - Running 130/90 at home.  Chest pain- no  Dyspnea- no Headache - no  Medications: Lisinopril 40 mg, Coreg 25 mg BID  Compliance- yes Lightheadedness- no  Edema- no   Diabetes mellitus, Type 2 Disease Monitoring             Blood Sugar Ranges: Fasting - 170-210, lowest CBG was 140             Polyuria: no             Visual problems: no   Urine Microalbumin 828 in Jan 2021 (on Ace Inhibitor)   Last A1C: 9.9 (April 2021)   Medications: Glipizide 5 mg BID, Lantus 22u daily  Medication Compliance: yes  Medication Side Effects             Hypoglycemia: no          Health Maintenance:  Health Maintenance Due  Topic Date Due  . COVID-19 Vaccine (1) Never done    -  reports that he quit smoking about 7 years ago. His smoking use included cigarettes. He has a 5.61 pack-year smoking history. He has never used smokeless tobacco. - Review of Systems: Per HPI. - Past Medical History: Patient Active Problem List   Diagnosis Date Noted  . Hypertensive emergency 08/27/2019  . Hyperlipidemia 06/01/2019  . Microalbuminuria due to type 2 diabetes mellitus ([place]) 06/01/2019  . Essential hypertension 05/31/2019  . Intractable nausea and vomiting 08/31/2015  . Hypertensive urgency 08/31/2015  . Uncontrolled diabetes mellitus ([place]) 08/31/2015  . Marijuana abuse 08/31/2015  . Noncompliance with medication regimen 08/31/2015   - Medications: reviewed and updated   Objective:   Physical Exam BP (!) 134/91    Pulse 71   Temp (!) 97.3 F (36.3 C) (Temporal)   Resp 17   Wt 198 lb (89.8 kg)   SpO2 98%   BMI 31.01 kg/m  Physical Exam  Constitutional: He is oriented to person, place, and time and well-developed, well-nourished, and in no distress. No distress.  Cardiovascular: Normal rate.  Pulmonary/Chest: Effort normal. No respiratory distress.  Musculoskeletal:        General: Normal range of motion.  Neurological: He is alert and oriented to person, place, and time.  Skin: Skin is warm and dry. He is not diaphoretic.  Psychiatric: Affect and judgment normal.        Assessment & Plan:   1. Uncontrolled type 2 diabetes mellitus with hyperglycemia (HCC) CBG 182 (did drink OJ with medications this AM). He has started Glipizide since last visit. No hypoglycemia. Fasting CBGs better controlled but still elevated. Instructed on how to increase Lantus by 2 units daily for fasting CBGs >130. Return in 2 months for f/u and repeat A1c.  Counseled on Diabetic diet, my plate method, 948 minutes of moderate intensity exercise/week Blood sugar logs with fasting goals of 80-120 mg/dl, random of less than 180 and in the event of sugars less than 60 mg/dl or greater than 400 mg/dl encouraged to  notify the clinic. Advised on the need for annual eye exams, annual foot exams, Pneumonia vaccine. - Glucose (CBG)  2. Essential hypertension BP more optimally controlled with increase in Coreg dose since last visit. Will continue to monitor.   3. Elevated serum GGT level Patient with elevated alk phos and subsequently elevated GGT. Will obtain RUQ ultrasound for further evaluation.  - US Abdomen Limited RUQ; Future  4. History of pancreatitis Patient with h/o recurrent acute pancreatitis, feels like he is currently having pain similar to prior events. Will obtain lipase. He is not a current alcohol drinker and has not been for some time. Prior triglyceride level in low 400s. Would anticipate higher level to  cause acute pancreatitis. He was started on statin therapy; could consider other medications for triglyceride control. Will obtain RUQ ultrasound to evaluate for gallstones as etiology. Referral placed to GI due to recurrent nature and seems patient has never undergone advanced imagining for this.  - Lipase - Ambulatory referral to Gastroenterology     Phill Myron, D.O. 09/29/2019, 10:14 AM Primary Care at [facility]

## 2019-09-29 NOTE — Patient Instructions (Addendum)
Your abdominal ultrasound has been scheduled at [facility] on 10/01/2019 at 10:15 AM. You can't have anything to eat or drink after midnight the day of the exam. You will go to first floor radiology & let them know that you have outpatient imaging ordered by your doctor.  For every morning that your fasting blood sugar is >130, increase your insulin dose by 2 units. For example if tomorrow your blood sugar prior to eating breakfast is 160, you would take 24 units of insulin. If, the following day you blood sugar is 140 you would take 26 units of insulin. Once you have a fasting blood sugar <130, stop increasing your dose and continue to take the number of units you had most recently gotten up to. Please call for blood sugars less than 80.

## 2019-09-30 LAB — LIPASE: Lipase: 119 U/L — ABNORMAL HIGH (ref 13–78)

## 2019-10-01 ENCOUNTER — Ambulatory Visit (HOSPITAL_COMMUNITY)
Admission: RE | Admit: 2019-10-01 | Discharge: 2019-10-01 | Disposition: A | Discharge status: Home / Self Care | Payer: Self-pay | Source: Ambulatory Visit | Attending: Internal Medicine | Admitting: Internal Medicine

## 2019-10-01 ENCOUNTER — Other Ambulatory Visit: Payer: Self-pay

## 2019-10-01 DIAGNOSIS — R748 Abnormal levels of other serum enzymes: Secondary | ICD-10-CM | POA: Insufficient documentation

## 2019-10-04 NOTE — Progress Notes (Signed)
Patient notified of results & recommendations. Expressed understanding.  Has an appointment with GI on 11/03/2019.  He wants to know what he can take for pain other than Ibuprofen.

## 2019-10-04 NOTE — Progress Notes (Signed)
Patient notified of results & recommendations. Expressed understanding.

## 2019-10-06 ENCOUNTER — Emergency Department (HOSPITAL_COMMUNITY)
Admission: EM | Admit: 2019-10-06 | Discharge: 2019-10-06 | Disposition: A | Discharge status: Home / Self Care | Payer: Self-pay | Attending: Emergency Medicine | Admitting: Emergency Medicine

## 2019-10-06 ENCOUNTER — Emergency Department (HOSPITAL_COMMUNITY): Payer: Self-pay

## 2019-10-06 ENCOUNTER — Encounter (HOSPITAL_COMMUNITY): Payer: Self-pay

## 2019-10-06 ENCOUNTER — Other Ambulatory Visit: Payer: Self-pay

## 2019-10-06 DIAGNOSIS — K8500 Idiopathic acute pancreatitis without necrosis or infection: Secondary | ICD-10-CM | POA: Insufficient documentation

## 2019-10-06 DIAGNOSIS — E119 Type 2 diabetes mellitus without complications: Secondary | ICD-10-CM | POA: Insufficient documentation

## 2019-10-06 DIAGNOSIS — Z87891 Personal history of nicotine dependence: Secondary | ICD-10-CM | POA: Insufficient documentation

## 2019-10-06 DIAGNOSIS — Z79899 Other long term (current) drug therapy: Secondary | ICD-10-CM | POA: Insufficient documentation

## 2019-10-06 DIAGNOSIS — Z794 Long term (current) use of insulin: Secondary | ICD-10-CM | POA: Insufficient documentation

## 2019-10-06 DIAGNOSIS — I10 Essential (primary) hypertension: Secondary | ICD-10-CM | POA: Insufficient documentation

## 2019-10-06 LAB — COMPREHENSIVE METABOLIC PANEL
ALT: 31 U/L (ref 0–44)
AST: 25 U/L (ref 15–41)
Albumin: 3 g/dL — ABNORMAL LOW (ref 3.5–5.0)
Alkaline Phosphatase: 168 U/L — ABNORMAL HIGH (ref 38–126)
Anion gap: 8 (ref 5–15)
BUN: 18 mg/dL (ref 6–20)
CO2: 25 mmol/L (ref 22–32)
Calcium: 9.1 mg/dL (ref 8.9–10.3)
Chloride: 101 mmol/L (ref 98–111)
Creatinine, Ser: 1.03 mg/dL (ref 0.61–1.24)
GFR calc Af Amer: >60 mL/min (ref >60)
GFR calc non Af Amer: >60 mL/min (ref >60)
Glucose, Bld: 171 mg/dL — ABNORMAL HIGH (ref 70–99)
Potassium: 4.5 mmol/L (ref 3.5–5.1)
Sodium: 134 mmol/L — ABNORMAL LOW (ref 135–145)
Total Bilirubin: 0.9 mg/dL (ref 0.3–1.2)
Total Protein: 7.1 g/dL (ref 6.5–8.1)

## 2019-10-06 LAB — URINALYSIS, ROUTINE W REFLEX MICROSCOPIC
Bilirubin Urine: NEGATIVE
Glucose, UA: 50 mg/dL — AB
Ketones, ur: NEGATIVE mg/dL
Leukocytes,Ua: NEGATIVE
Nitrite: NEGATIVE
Protein, ur: >=300 mg/dL — AB
Specific Gravity, Urine: 1.017 (ref 1.005–1.030)
pH: 5 (ref 5.0–8.0)

## 2019-10-06 LAB — CBC
HCT: 36.6 % — ABNORMAL LOW (ref 39.0–52.0)
Hemoglobin: 11.8 g/dL — ABNORMAL LOW (ref 13.0–17.0)
MCH: 27.2 pg (ref 26.0–34.0)
MCHC: 32.2 g/dL (ref 30.0–36.0)
MCV: 84.3 fL (ref 80.0–100.0)
Platelets: 294 10*3/uL (ref 150–400)
RBC: 4.34 MIL/uL (ref 4.22–5.81)
RDW: 12.1 % (ref 11.5–15.5)
WBC: 5.7 10*3/uL (ref 4.0–10.5)
nRBC: 0 % (ref 0.0–0.2)

## 2019-10-06 LAB — CBG MONITORING, ED: Glucose-Capillary: 120 mg/dL — ABNORMAL HIGH (ref 70–99)

## 2019-10-06 LAB — LIPASE, BLOOD: Lipase: 685 U/L — ABNORMAL HIGH (ref 11–51)

## 2019-10-06 MED ORDER — IOHEXOL 300 MG/ML  SOLN
100.0000 mL | Freq: Once | INTRAMUSCULAR | Status: AC | PRN
  Administered 2019-10-06: 100 mL via INTRAVENOUS

## 2019-10-06 MED ORDER — MORPHINE SULFATE (PF) 2 MG/ML IV SOLN
2.0000 mg | Freq: Once | INTRAVENOUS | Status: AC
  Administered 2019-10-06: 2 mg via INTRAVENOUS
  Filled 2019-10-06: qty 1

## 2019-10-06 MED ORDER — HYDROCODONE-ACETAMINOPHEN 5-325 MG PO TABS: 2.0000 | ORAL_TABLET | ORAL | 0 refills | Status: DC | PRN

## 2019-10-06 MED ORDER — LISINOPRIL 20 MG PO TABS
40.0000 mg | ORAL_TABLET | Freq: Every day | ORAL | Status: DC
  Administered 2019-10-06: 40 mg via ORAL
  Filled 2019-10-06: qty 2

## 2019-10-06 MED ORDER — MORPHINE SULFATE (PF) 4 MG/ML IV SOLN
4.0000 mg | Freq: Once | INTRAVENOUS | Status: AC
  Administered 2019-10-06: 4 mg via INTRAVENOUS
  Filled 2019-10-06: qty 1

## 2019-10-06 MED ORDER — GLIPIZIDE 5 MG PO TABS
5.0000 mg | ORAL_TABLET | Freq: Two times a day (BID) | ORAL | Status: DC
  Administered 2019-10-06: 5 mg via ORAL
  Filled 2019-10-06 (×2): qty 1

## 2019-10-06 MED ORDER — SODIUM CHLORIDE 0.9 % IV BOLUS
1000.0000 mL | Freq: Once | INTRAVENOUS | Status: AC
  Administered 2019-10-06: 1000 mL via INTRAVENOUS

## 2019-10-06 MED ORDER — CARVEDILOL 12.5 MG PO TABS
25.0000 mg | ORAL_TABLET | Freq: Two times a day (BID) | ORAL | Status: DC
  Administered 2019-10-06 (×2): 25 mg via ORAL
  Filled 2019-10-06 (×2): qty 2

## 2019-10-06 NOTE — ED Notes (Signed)
Patient verbalizes understanding of discharge instructions. Opportunity for questioning and answers were provided. Armband removed by staff, pt discharged from ED ambulatory w/ son 

## 2019-10-06 NOTE — ED Triage Notes (Signed)
Pt reports LUQ pain for the past 2 weeks, intermittent vomiting, hx of pancreatitis. Follow up appt with GI next week.

## 2019-10-06 NOTE — Discharge Instructions (Addendum)
Please make sure to go to your appointment with Dr. Lavon Paganini tomorrow, as this is very important.  I have provided a short course of pain medication to manage your pain.  Return to the ER if your symptoms worsen.

## 2019-10-06 NOTE — ED Notes (Signed)
This tech gave the patient a [place] sandwich and water upon the PA's [facility]) request.

## 2019-10-06 NOTE — ED Provider Notes (Signed)
[facility] EMERGENCY DEPARTMENT Provider Note   CSN: [id number] Arrival date & time: 10/06/19  6579     History Chief Complaint  Patient presents with   Abdominal Pain    Randy [name] is a 39 y.o. male.  HPI 39 year old male with a history of hypertension, poorly controlled DM type II, hyperlipidemia, pancreatitis presents to the ER with low back pain and upper abdominal pain x1 week.  Patient refers that he has a history of pancreatitis and the symptoms are similar to his previous presentations.  He had 2 episodes of nonbloody nonbilious vomiting 2 days ago, but has been tolerating p.o. well.  He has a follow-up with GI next month.  He has tried ibuprofen and heating pads along with Tums for his symptoms with little relief.  He presents to the ER today hoping to get his pain under control.  Denies any fevers, chills, chest pain, shortness of breath, headaches, groin numbness, weakness, numbness, foot drop, bowel bladder incontinence diarrhea, hematochezia, headaches, eye pain/vision changes.  No history of IVDU.  He refers that he has not had a alcoholic drink in the last 6 years.  He is not sure why he keeps getting these recurrent bouts of pancreatitis.  He states that the last time he came to the ER, he had a right upper quadrant ultrasound which was normal.  I personally reviewed the right upper quadrant ultrasound results from 10/01/2019, that showed no evidence of acute cholecystitis but did show a mildly prominent common duct size with recommendations for further evaluation with MRCP.  He also had a CT back in January 2020 one of the abdomen which showed no abnormalities.  He states that current wellness had referred him to GI, he is not aware of the name of the doctor.  States he has an appointment in 1 month.  Chart review shows multiple visits to the ER this year alone for pancreatitis and hypertensive urgency.  Patient states he has been compliant with his blood  pressure medications, checks his blood pressures daily and have been running in the 038B systolic and 33O diastolic.  He is also takes glipizide and Lantus for his blood sugars.  States that that they have been well controlled.  Patient is followed by [facility] and wellness Dr. Juleen China.    Past Medical History:  Diagnosis Date   Hypertension    Pancreatitis    Type II diabetes mellitus ([place])     Patient Active Problem List   Diagnosis Date Noted   Hypertensive emergency 08/27/2019   Hyperlipidemia 06/01/2019   Microalbuminuria due to type 2 diabetes mellitus ([place]) 06/01/2019   Essential hypertension 05/31/2019   Intractable nausea and vomiting 08/31/2015   Hypertensive urgency 08/31/2015   Uncontrolled diabetes mellitus ([place]) 08/31/2015   Marijuana abuse 08/31/2015   Noncompliance with medication regimen 08/31/2015    Past Surgical History:  Procedure Laterality Date   NO PAST SURGERIES         Family History  Problem Relation Age of Onset   Hypertension Mother    Diabetes Father    Hypertension Father     Social History   Tobacco Use   Smoking status: Former Smoker    Packs/day: 0.33    Years: 17.00    Pack years: 5.61    Types: Cigarettes    Quit date: 12/26/2011    Years since quitting: 7.7   Smokeless tobacco: Never Used  Substance Use Topics   Alcohol use: No  Drug use: Yes    Types: Marijuana    Comment: 08/31/2015 "frequent times/day"    Home Medications Prior to Admission medications   Medication Sig Start Date End Date Taking? Authorizing Provider  Blood Glucose Monitoring Suppl (TRUE METRIX METER) w/Device KIT Use to check blood sugar TID. E11.65 Patient taking differently: 1 each by Other route in the morning, at noon, and at bedtime. Use to check blood sugar TID. E11.65 06/10/19  Yes Newlin, Charlane Ferretti, MD  carvedilol (COREG) 25 MG tablet Take 1 tablet (25 mg total) by mouth 2 (two) times daily with a meal. 09/01/19  Yes Nicolette Bang, DO  cholecalciferol (VITAMIN D3) 25 MCG (1000 UNIT) tablet Take 1,000 Units by mouth daily.   Yes [provider]  glipiZIDE (GLUCOTROL) 5 MG tablet Take 1 tablet (5 mg total) by mouth 2 (two) times daily before a meal. 09/01/19  Yes Nicolette Bang, DO  glucose blood test strip Use as instructed to check TID. E11.65 Patient taking differently: 1 each by Other route in the morning, at noon, and at bedtime. Use as instructed to check TID. E11.65 06/10/19  Yes Newlin, Enobong, MD  insulin glargine (LANTUS) 100 UNIT/ML Solostar Pen Inject 22 Units into the skin daily. Patient taking differently: Inject 24 Units into the skin daily.  09/01/19 11/30/19 Yes Nicolette Bang, DO  Insulin Pen Needle (TRUEPLUS 5-BEVEL PEN NEEDLES) 32G X 4 MM MISC Use to inject insulin daily. Patient taking differently: 1 each by Other route daily. Use to inject insulin daily. 06/10/19  Yes Charlott Rakes, MD  lisinopril (ZESTRIL) 40 MG tablet Take 1 tablet (40 mg total) by mouth daily. 07/28/19  Yes Gildardo Pounds, NP  TRUEplus Lancets 28G MISC Use to check blood sugar three times daily. E11.65 Patient taking differently: 1 each by Other route in the morning, at noon, and at bedtime. Use to check blood sugar three times daily. E11.65 06/10/19  Yes Charlott Rakes, MD  atorvastatin (LIPITOR) 80 MG tablet Take 1 tablet (80 mg total) by mouth daily. Patient not taking: Reported on 10/06/2019 07/28/19   Gildardo Pounds, NP  HYDROcodone-acetaminophen (NORCO/VICODIN) 5-325 MG tablet Take 2 tablets by mouth every 4 (four) hours as needed. 10/06/19   Garald Balding, PA-C    Allergies    Patient has no known allergies.  Review of Systems   Review of Systems  Constitutional: Negative for chills and fever.  HENT: Negative for ear pain and sore throat.   Eyes: Negative for pain and visual disturbance.  Respiratory: Negative for cough and shortness of breath.   Cardiovascular: Negative  for chest pain and palpitations.  Gastrointestinal: Positive for abdominal pain, nausea and vomiting. Negative for blood in stool, constipation and diarrhea.  Genitourinary: Negative for dysuria and hematuria.  Musculoskeletal: Positive for back pain. Negative for arthralgias, neck pain and neck stiffness.  Skin: Negative for color change and rash.  Neurological: Negative for dizziness, seizures, syncope, light-headedness and headaches.  All other systems reviewed and are negative.   Physical Exam Updated Vital Signs BP (!) 193/110    Pulse 72    Temp 97.9 F (36.6 C) (Oral)    Resp 18    Ht '5\' 8"'  (1.727 m)    Wt 86.2 kg    SpO2 100%    BMI 28.89 kg/m   Physical Exam Vitals and nursing note reviewed.  Constitutional:      Appearance: He is well-developed.  HENT:     Head:  Normocephalic and atraumatic.  Eyes:     Conjunctiva/sclera: Conjunctivae normal.  Cardiovascular:     Rate and Rhythm: Normal rate and regular rhythm.     Heart sounds: Normal heart sounds. No murmur.  Pulmonary:     Effort: Pulmonary effort is normal. No respiratory distress.     Breath sounds: Normal breath sounds.  Abdominal:     General: Abdomen is flat. Bowel sounds are normal. There is no distension.     Palpations: Abdomen is soft.     Tenderness: There is abdominal tenderness in the right upper quadrant, right lower quadrant, epigastric area and left upper quadrant. There is no right CVA tenderness, left CVA tenderness, guarding or rebound. Positive signs include McBurney's sign. Negative signs include Murphy's sign.     Hernia: No hernia is present.  Musculoskeletal:     Cervical back: Neck supple.     Comments: Midline L-spine tenderness to palpation.  No paraspinal muscle tenderness, no flank pain.  5/5 strength, sensations intact, full range of motion of upper and lower extremities.  Patient is able to ambulate in the ED with no difficulty.  Skin:    General: Skin is warm and dry.     Capillary  Refill: Capillary refill takes less than 2 seconds.     Coloration: Skin is not jaundiced.     Findings: No erythema or rash.  Neurological:     General: No focal deficit present.     Mental Status: He is alert and oriented to person, place, and time.     Motor: No weakness.  Psychiatric:        Mood and Affect: Mood normal.        Behavior: Behavior normal.     ED Results / Procedures / Treatments   Labs (all labs ordered are listed, but only abnormal results are displayed) Labs Reviewed  LIPASE, BLOOD - Abnormal; Notable for the following components:      Result Value   Lipase 685 (*)    All other components within normal limits  COMPREHENSIVE METABOLIC PANEL - Abnormal; Notable for the following components:   Sodium 134 (*)    Glucose, Bld 171 (*)    Albumin 3.0 (*)    Alkaline Phosphatase 168 (*)    All other components within normal limits  CBC - Abnormal; Notable for the following components:   Hemoglobin 11.8 (*)    HCT 36.6 (*)    All other components within normal limits  URINALYSIS, ROUTINE W REFLEX MICROSCOPIC - Abnormal; Notable for the following components:   APPearance HAZY (*)    Glucose, UA 50 (*)    Hgb urine dipstick MODERATE (*)    Protein, ur >=300 (*)    Bacteria, UA RARE (*)    All other components within normal limits  CBG MONITORING, ED - Abnormal; Notable for the following components:   Glucose-Capillary 120 (*)    All other components within normal limits    EKG EKG Interpretation  Date/Time:  Wednesday Oct 06 2019 13:03:55 EDT Ventricular Rate:  62 PR Interval:    QRS Duration: 83 QT Interval:  416 QTC Calculation: 423 R Axis:   49 Text Interpretation: Sinus rhythm since last tracing no significant change Confirmed by Daleen Bo [phone]) on 10/06/2019 4:51:27 PM   Radiology DG Lumbar Spine Complete  Result Date: 10/06/2019 CLINICAL DATA:  Right flank pain for 1 week with intermittent vomiting. History of pancreatitis. EXAM:  LUMBAR SPINE - COMPLETE 4+ VIEW COMPARISON:  None. FINDINGS: This report assumes 5 non rib-bearing lumbar vertebrae. Lumbar vertebral body heights are preserved, with no fracture. Lumbar disc heights are preserved. No spondylosis. No spondylolisthesis. No appreciable facet arthropathy. No aggressive appearing focal osseous lesions. IMPRESSION: Normal lumbar spine radiographs. Electronically Signed   By: Ilona Sorrel M.D.   On: 10/06/2019 12:26   CT ABDOMEN PELVIS W CONTRAST  Result Date: 10/06/2019 CLINICAL DATA:  Right upper quadrant and left upper quadrant pain, history of pancreatitis intermittent vomiting EXAM: CT ABDOMEN AND PELVIS WITH CONTRAST TECHNIQUE: Multidetector CT imaging of the abdomen and pelvis was performed using the standard protocol following bolus administration of intravenous contrast. CONTRAST:  141m OMNIPAQUE IOHEXOL 300 MG/ML  SOLN COMPARISON:  June 09, 2019 FINDINGS: Lower chest: The visualized heart size within normal limits. No pericardial fluid/thickening. No hiatal hernia. The visualized portions of the lungs are clear. Hepatobiliary: Left there is diffuse low density seen throughout the liver parenchyma. The main portal vein is patent. No evidence of calcified gallstones, gallbladder wall thickening or biliary dilatation. Pancreas: Unremarkable. No pancreatic ductal dilatation or surrounding inflammatory changes. Spleen: Normal in size without focal abnormality. Adrenals/Urinary Tract: Both adrenal glands appear normal. The kidneys and collecting system appear normal without evidence of urinary tract calculus or hydronephrosis. Bladder is unremarkable. Stomach/Bowel: There appears to be mild wall thickening within the mid body of the stomach. No significant surrounding fat stranding changes however are noted. Vascular/Lymphatic: There are no enlarged mesenteric, retroperitoneal, or pelvic lymph nodes. No significant vascular findings are present. Reproductive: The prostate is  unremarkable. Other: No evidence of abdominal wall mass or hernia. Musculoskeletal: No acute or significant osseous findings. IMPRESSION: Mild wall thickening of the mid to distal gastric wall which could be due to mild gastritis. No significant surrounding fat stranding changes or loculated fluid collections. Electronically Signed   By: BPrudencio PairM.D.   On: 10/06/2019 14:05    Procedures Procedures (including critical care time)  Medications Ordered in ED Medications  carvedilol (COREG) tablet 25 mg (25 mg Oral Given 10/06/19 1705)  glipiZIDE (GLUCOTROL) tablet 5 mg (5 mg Oral Given 10/06/19 1705)  lisinopril (ZESTRIL) tablet 40 mg (40 mg Oral Given 10/06/19 1248)  morphine 2 MG/ML injection 2 mg (2 mg Intravenous Given 10/06/19 1158)  sodium chloride 0.9 % bolus 1,000 mL (0 mLs Intravenous Stopped 10/06/19 1459)  iohexol (OMNIPAQUE) 300 MG/ML solution 100 mL (100 mLs Intravenous Contrast Given 10/06/19 1325)  morphine 2 MG/ML injection 2 mg (2 mg Intravenous Given 10/06/19 1552)  morphine 4 MG/ML injection 4 mg (4 mg Intravenous Given 10/06/19 1705)    ED Course  I have reviewed the triage vital signs and the nursing notes.  Pertinent labs & imaging results that were available during my care of the patient were reviewed by me and considered in my medical decision making (see chart for details).    MDM Rules/Calculators/A&P                     39year old male with right upper quadrant, epigastric and left upper quadrant pain x1 week.   On presentation to the ER, the patient is alert and oriented, in no acute distress, speaking in full sentences, nontoxic appearing, with no increased work of breathing.  He is hypertensive in the ED with a blood pressure of 203/100, but he does not have any eye pain, headache, nausea, vomiting, or any other signs of hypertensive urgency/emergency.  He states that he has not taken his  morning blood pressure medications, will reorder home meds today and recheck.   He also states that he has not taken his glipizide this morning, glucose 120 in the ER, will also refill home glipizide.  Physical exam with midline low back tenderness, no flank tenderness, right upper quadrant epigastric and left upper quadrant tenderness to palpation.  However abdomen is soft, without evidence of peritonitis.  He also has some right lower quadrant tenderness to palpation.  Patient refers that all of these pains are consistent with his previous pancreatitis flareups.  CMP with mild hyponatremia of 134, glucose 171.  Albumin slightly decreased at 3.0 and his alk phos is still elevated at 168.  Personally reviewed his previous labs, and these values seem to be improved from previous visits.  CBC without leukocytosis, mildly decreased hemoglobin of 11.8. UA with hazy appearance, glucose, moderate hemoglobin and significant proteinuria with rare bacteria.  I personally reviewed his previous UAs, these findings seem to be consistent with previous visits and the patient denies any dysuria, hematuria.  He does not have a history of kidney stones.  Most significantly, lipase today is elevated at 685.  Previous values only available from a month ago, his previous values have been as high as 119, but never as high as 685.  and GGT.  Right upper quadrant ultrasound with mildly prominent common duct size for patient's age, recommended MRCP for follow-up.  His last triglycerides were in the 400s, so doubt this would be the cause of his pancreatitis.  He states he has not h Given midline low back pain, plain films show no acute abnormalities.   Chart review shows patient was seen by Dr. Juleen China with [facility], and a right upper quadrant ultrasound was ordered secondary to elevated alk phosad a drink in 6 years.  He has been referred to GI, the patient states that he has an appointment with an unknown clinic on June 23.  Patient treated with morphine and fluid bolus in the ER, notes  significant improvement in symptoms.  However, he does have some atypical presentations of pancreatitis including his low back pain and right lower quadrant pain.  Patient's main concern is pain control, states he has been taking ibuprofen and Tylenol with no relief.  Last CT of the abdomen was back in January 2021.  Given recurrent bouts of pancreatitis and atypical pain presentation, I will CT his abdomen to rule out necrotizing pancreatitis, appendicitis, or any other acute intra-abdominal process that could be causing his pain.  If this comes back normal, will consult GI and attempt to get the patient in sooner than June 23.  Patient requesting lunch, was able to tolerate a full meal without excessive nausea and vomiting.  CT abdomen suggestive of mild gastritis with gastric wall thickening.  Otherwise no evidence of necrotic pancreas, appendicitis, or any other intra-abdominal pathology.  Consulted [facility], spoke with Judson Roch who directed me to call the office to try to schedule an appointment for the patient.  I was able to schedule an appointment at 230 tomorrow with Dr. Nyoka Cowden.  I informed the patient of this, he states he will be able to make the appointment tomorrow.  Patient has still had no episodes of nausea or vomiting in the ED.   Patient remained hypertensive throughout ED course, with his blood pressures as higrtension, and admissions for hypertensive urgency/emergency.  He denies any neurologic or Cardiologic complaints, doubt hypertensive urgency/emergency.  Patient received home lisinopril and carvedilol.  His  pressures remained high, likely secondary to pah as 231/120.  The patient has a history of hypein.  Patient received an additional 6 mg of morphine throughout the ED course, no significant improvement of pain.  I prescribed him a short course of Norco.  Blood pressure at discharge 196/107 with no signs of hypertensive urgency/emergency. Pain is well controlled at this time and  patient is tolerating PO well.   I encouraged him to take his afternoon dose of carvedilol.  Strict return precautions were given which included vision changes, eye pain, nausea, worsening nausea, vomiting, fevers, chills  headaches, chest pain, shortness of breath.  Patient voices understanding and is agreeable to this plan.  At this stage in the ED course, the patient has been adequately screened and is stable for discharge.  He remains hemodynamically stable, with close follow-up with GI tomorrow at 230.  I discussed the case with Dr. Eulis Foster who is agreeable to the above plan.  Final Clinical Impression(s) / ED Diagnoses Final diagnoses:  Idiopathic acute pancreatitis, unspecified complication status  Essential hypertension    Rx / DC Orders ED Discharge Orders         Ordered    HYDROcodone-acetaminophen (NORCO/VICODIN) 5-325 MG tablet  Every 4 hours PRN,   Status:  Discontinued     10/06/19 1520    HYDROcodone-acetaminophen (NORCO/VICODIN) 5-325 MG tablet  Every 4 hours PRN     10/06/19 1556           Lyndel Safe 10/06/19 1737    Daleen Bo, MD 10/08/19 [phone]

## 2019-10-07 ENCOUNTER — Ambulatory Visit (INDEPENDENT_AMBULATORY_CARE_PROVIDER_SITE_OTHER): Payer: Self-pay | Admitting: Gastroenterology

## 2019-10-07 ENCOUNTER — Encounter: Payer: Self-pay | Admitting: Gastroenterology

## 2019-10-07 VITALS — BP 124/88 | HR 72 | Ht 67.25 in | Wt 199.0 lb

## 2019-10-07 DIAGNOSIS — R1013 Epigastric pain: Secondary | ICD-10-CM

## 2019-10-07 DIAGNOSIS — K838 Other specified diseases of biliary tract: Secondary | ICD-10-CM

## 2019-10-07 DIAGNOSIS — R935 Abnormal findings on diagnostic imaging of other abdominal regions, including retroperitoneum: Secondary | ICD-10-CM

## 2019-10-07 DIAGNOSIS — R112 Nausea with vomiting, unspecified: Secondary | ICD-10-CM

## 2019-10-07 DIAGNOSIS — R748 Abnormal levels of other serum enzymes: Secondary | ICD-10-CM

## 2019-10-07 MED ORDER — OMEPRAZOLE 40 MG PO CPDR
40.0000 mg | DELAYED_RELEASE_CAPSULE | Freq: Every day | ORAL | 3 refills | Status: DC
Start: 2019-10-07 — End: 2020-09-13
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
## 2019-10-07 NOTE — Patient Instructions (Addendum)
We have sent the following medications to your pharmacy for you to pick up at your convenience:  Omeprazole  You have been scheduled for an MRCP at [facility] (1st floor) on 10/18/2019 Your appointment time is 2:30pm. Please arrive 15 minutes prior to your appointment time for registration purposes. Please make certain not to have anything to eat or drink after 4 hours prior to your test. In addition, if you have any metal in your body, have a pacemaker or defibrillator, please be sure to let your ordering physician know. This test typically takes 45 minutes to 1 hour to complete. Should you need to reschedule, please call [phone] to do so.  You have been scheduled for an endoscopy. Please follow written instructions given to you at your visit today. If you use inhalers (even only as needed), please bring them with you on the day of your procedure. out your procedure. However, you should still follow specific instructions given to you by our office regarding your preparation for the procedure.  Please follow up in one year

## 2019-10-08 MED FILL — OMEPRAZOLE DR 40 MG CAPSULE: 40 | 30 days supply | Qty: 30 | Fill #0

## 2019-10-14 ENCOUNTER — Other Ambulatory Visit: Payer: Self-pay | Admitting: Gastroenterology

## 2019-10-14 ENCOUNTER — Ambulatory Visit (INDEPENDENT_AMBULATORY_CARE_PROVIDER_SITE_OTHER): Payer: Self-pay

## 2019-10-14 DIAGNOSIS — Z1159 Encounter for screening for other viral diseases: Secondary | ICD-10-CM

## 2019-10-14 LAB — SARS CORONAVIRUS 2 (TAT 6-24 HRS): SARS Coronavirus 2: NEGATIVE

## 2019-10-18 ENCOUNTER — Other Ambulatory Visit: Payer: Self-pay

## 2019-10-18 ENCOUNTER — Encounter: Payer: Self-pay | Admitting: Gastroenterology

## 2019-10-18 ENCOUNTER — Ambulatory Visit (AMBULATORY_SURGERY_CENTER): Payer: Self-pay | Admitting: Gastroenterology

## 2019-10-18 VITALS — BP 150/98 | HR 68 | Temp 96.9°F | Resp 14 | Ht 67.25 in | Wt 199.0 lb

## 2019-10-18 DIAGNOSIS — R1013 Epigastric pain: Secondary | ICD-10-CM

## 2019-10-18 DIAGNOSIS — K449 Diaphragmatic hernia without obstruction or gangrene: Secondary | ICD-10-CM

## 2019-10-18 DIAGNOSIS — K2900 Acute gastritis without bleeding: Secondary | ICD-10-CM

## 2019-10-18 MED ORDER — SODIUM CHLORIDE 0.9 % IV SOLN: 500.0000 mL | Freq: Once | INTRAVENOUS | Status: DC

## 2019-10-18 MED FILL — !LANTUS SOLOSTAR 100UNITS/M: 100 | 33 days supply | Qty: 6 | Fill #2

## 2019-10-18 NOTE — Progress Notes (Signed)
Called to room to assist during endoscopic procedure.  Patient ID and intended procedure confirmed with present staff. Received instructions for my participation in the procedure from the performing physician.  

## 2019-10-18 NOTE — Op Note (Signed)
[facility] Patient Name: Randy [name] Procedure Date: 10/18/2019 10:39 AM MRN: [id number] Endoscopist: Napoleon Form , MD Age: 39 Referring MD:  Date of Birth: 03-Dec-1980 Gender: Male Account #: [id number] Procedure:                Upper GI endoscopy Indications:              Epigastric abdominal pain Medicines:                Monitored Anesthesia Care Procedure:                Pre-Anesthesia Assessment:                           - Prior to the procedure, a History and Physical                            was performed, and patient medications and                            allergies were reviewed. The patient's tolerance of                            previous anesthesia was also reviewed. The risks                            and benefits of the procedure and the sedation                            options and risks were discussed with the patient.                            All questions were answered, and informed consent                            was obtained. Prior Anticoagulants: The patient has                            taken no previous anticoagulant or antiplatelet                            agents. ASA Grade Assessment: II - A patient with                            mild systemic disease. After reviewing the risks                            and benefits, the patient was deemed in                            satisfactory condition to undergo the procedure.                           After obtaining informed consent, the endoscope was  passed under direct vision. Throughout the                            procedure, the patient's blood pressure, pulse, and                            oxygen saturations were monitored continuously. The                            Endoscope was introduced through the mouth, and                            advanced to the second part of duodenum. The upper                            GI endoscopy was  accomplished without difficulty.                            The patient tolerated the procedure well. Scope In: Scope Out: Findings:                 The Z-line was regular and was found 37 cm from the                            incisors.                           No gross lesions were noted in the entire esophagus.                           A small hiatal hernia was present.                           A few dispersed less than 1 mm erosions with no                            bleeding and no stigmata of recent bleeding were                            found in the entire examined stomach. Biopsies were                            taken with a cold forceps for [Purpose]. Biopsies                            were taken with a cold forceps for Helicobacter                            pylori testing.                           The examined duodenum was normal. Complications:            No immediate complications. Estimated Blood Loss:     Estimated blood loss was minimal.  Impression:               - Z-line regular, 37 cm from the incisors.                           - No gross lesions in esophagus.                           - Small hiatal hernia.                           - Erosive gastropathy with no bleeding and no                            stigmata of recent bleeding. Biopsied.                           - Normal examined duodenum. Recommendation:           - Patient has a contact number available for                            emergencies. The signs and symptoms of potential                            delayed complications were discussed with the                            patient. Return to normal activities tomorrow.                            Written discharge instructions were provided to the                            patient.                           - Resume previous diet.                           - Continue present medications.                           - Await pathology  results. Mauri Pole, MD 10/18/2019 10:59:57 AM This report has been signed electronically.

## 2019-10-18 NOTE — Progress Notes (Signed)
Robinal 0.1 mg IV given due large amount of secretions upon assessment.  MD made aware, vss 

## 2019-10-18 NOTE — Progress Notes (Signed)
Report given to PACU, vss 

## 2019-10-18 NOTE — Patient Instructions (Signed)
Handouts provided on gastritis and hiatal hernia.  ? ?YOU HAD AN ENDOSCOPIC PROCEDURE TODAY AT THE [facility]:   Refer to the procedure report that was given to you for any specific questions about what was found during the examination.  If the procedure report does not answer your questions, please call your gastroenterologist to clarify.  If you requested that your care partner not be given the details of your procedure findings, then the procedure report has been included in a sealed envelope for you to review at your convenience later. ? ?YOU SHOULD EXPECT: Some feelings of bloating in the abdomen. Passage of more gas than usual.  Walking can help get rid of the air that was put into your GI tract during the procedure and reduce the bloating. If you had a lower endoscopy (such as a colonoscopy or flexible sigmoidoscopy) you may notice spotting of blood in your stool or on the toilet paper. If you underwent a bowel prep for your procedure, you may not have a normal bowel movement for a few days. ? ?Please Note:  You might notice some irritation and congestion in your nose or some drainage.  This is from the oxygen used during your procedure.  There is no need for concern and it should clear up in a day or so. ? ?SYMPTOMS TO REPORT IMMEDIATELY: ? ?Following upper endoscopy (EGD) ? Vomiting of blood or coffee ground material ? New chest pain or pain under the shoulder blades ? Painful or persistently difficult swallowing ? New shortness of breath ? Fever of 100?F or higher ? Black, tarry-looking stools ? ?For urgent or emergent issues, a gastroenterologist can be reached at any hour by calling [phone]. ?Do not use MyChart messaging for urgent concerns.  ? ? ?DIET:  We do recommend a small meal at first, but then you may proceed to your regular diet.  Drink plenty of fluids but you should avoid alcoholic beverages for 24 hours. ? ?ACTIVITY:  You should plan to take it easy for the rest of today  and you should NOT DRIVE or use heavy machinery until tomorrow (because of the sedation medicines used during the test).   ? ?FOLLOW UP: ?Our staff will call the number listed on your records 48-72 hours following your procedure to check on you and address any questions or concerns that you may have regarding the information given to you following your procedure. If we do not reach you, we will leave a message.  We will attempt to reach you two times.  During this call, we will ask if you have developed any symptoms of COVID 19. If you develop any symptoms (ie: fever, flu-like symptoms, shortness of breath, cough etc.) before then, please call [phone].  If you test positive for Covid 19 in the 2 weeks post procedure, please call and report this information to us.   ? ?If any biopsies were taken you will be contacted by phone or by letter within the next 1-3 weeks.  Please call us at [phone] if you have not heard about the biopsies in 3 weeks.  ? ? ?SIGNATURES/CONFIDENTIALITY: ?You and/or your care partner have signed paperwork which will be entered into your electronic medical record.  These signatures attest to the fact that that the information above on your After Visit Summary has been reviewed and is understood.  Full responsibility of the confidentiality of this discharge information lies with you and/or your care-partner. ? ?

## 2019-10-18 NOTE — Progress Notes (Signed)
JB - Check-in DT - VS   

## 2019-10-20 ENCOUNTER — Telehealth: Payer: Self-pay | Admitting: *Deleted

## 2019-10-20 NOTE — Telephone Encounter (Signed)
Follow up call made, left message. 

## 2019-10-20 NOTE — Telephone Encounter (Signed)
Attempted 2nd f/u phone call. No answer. Left message.  °

## 2019-10-22 ENCOUNTER — Ambulatory Visit (HOSPITAL_COMMUNITY): Admission: RE | Admit: 2019-10-22 | Payer: Self-pay | Source: Ambulatory Visit

## 2019-10-22 ENCOUNTER — Other Ambulatory Visit: Payer: Self-pay | Admitting: Gastroenterology

## 2019-10-22 DIAGNOSIS — K838 Other specified diseases of biliary tract: Secondary | ICD-10-CM

## 2019-10-22 DIAGNOSIS — R1013 Epigastric pain: Secondary | ICD-10-CM

## 2019-10-22 DIAGNOSIS — R112 Nausea with vomiting, unspecified: Secondary | ICD-10-CM

## 2019-10-26 ENCOUNTER — Encounter: Payer: Self-pay | Admitting: Gastroenterology

## 2019-10-29 ENCOUNTER — Ambulatory Visit: Payer: Self-pay

## 2019-11-03 ENCOUNTER — Ambulatory Visit: Payer: Self-pay | Admitting: Gastroenterology

## 2019-11-30 ENCOUNTER — Other Ambulatory Visit: Payer: Self-pay | Admitting: Internal Medicine

## 2019-11-30 ENCOUNTER — Encounter: Payer: Self-pay | Admitting: Internal Medicine

## 2019-11-30 ENCOUNTER — Telehealth (INDEPENDENT_AMBULATORY_CARE_PROVIDER_SITE_OTHER): Payer: Self-pay | Admitting: Internal Medicine

## 2019-11-30 DIAGNOSIS — N529 Male erectile dysfunction, unspecified: Secondary | ICD-10-CM

## 2019-11-30 DIAGNOSIS — E1165 Type 2 diabetes mellitus with hyperglycemia: Secondary | ICD-10-CM

## 2019-11-30 DIAGNOSIS — Z1159 Encounter for screening for other viral diseases: Secondary | ICD-10-CM

## 2019-11-30 DIAGNOSIS — I10 Essential (primary) hypertension: Secondary | ICD-10-CM

## 2019-11-30 MED ORDER — GLIPIZIDE 5 MG PO TABS: 5.0000 mg | ORAL_TABLET | Freq: Two times a day (BID) | ORAL | 1 refills | Status: DC

## 2019-11-30 MED ORDER — SILDENAFIL CITRATE 100 MG PO TABS: 50.0000 mg | ORAL_TABLET | Freq: Every day | ORAL | 11 refills | Status: DC | PRN

## 2019-11-30 MED ORDER — CARVEDILOL 25 MG PO TABS: 25.0000 mg | ORAL_TABLET | Freq: Two times a day (BID) | ORAL | 1 refills | Status: DC

## 2019-11-30 MED FILL — ?GLIPIZIDE 5MG TABLET: 5 | 30 days supply | Qty: 60 | Fill #0

## 2019-11-30 MED FILL — ?CARVEDILOL 25 MG TABLET: 25 | 30 days supply | Qty: 60 | Fill #0

## 2019-11-30 MED FILL — SILDENAFIL CITRATE 100 MG T: 100 | 30 days supply | Qty: 10 | Fill #0

## 2019-11-30 NOTE — Progress Notes (Signed)
Virtual Visit via Telephone Note  I connected with Randy [name]., on 11/30/2019 at 9:29 AM by telephone due to the COVID-19 pandemic and verified that I am speaking with the correct person using two identifiers.   Consent: I discussed the limitations, risks, security and privacy concerns of performing an evaluation and management service by telephone and the availability of in person appointments. I also discussed with the patient that there may be a patient responsible charge related to this service. The patient expressed understanding and agreed to proceed.   Location of Patient: Home   Location of Provider: Clinic    Persons participating in Telemedicine visit: Sotirios R Dresner Alphonsa Gin [facility] Dr. Juleen China      History of Present Illness: Patient has a visit to f/u on chronic medical conditions.   Diabetes mellitus, Type 2 Disease Monitoring             Blood Sugar Ranges: Fasting - 110-120s   Polyuria: no              Visual problems: no   Urine Microalbumin 828 (Jan 2021) on Ace inhibitor   Last A1C: 9.9 (April 2021)   Medications: Glipizide 5 mg BID, Lantus 24u daily   Medication Compliance: yes  Medication Side Effects             Hypoglycemia: no   Chronic HTN Disease Monitoring:  Home BP Monitoring - 130/60s  Chest pain- no  Dyspnea- no Headache - no  Medications: Coreg 25 mg BID, Lisinopril 40 mg  Compliance- yes Lightheadedness- no  Edema- no      Past Medical History:  Diagnosis Date  . HLD (hyperlipidemia)   . Hypertension   . Pancreatitis   . Type II diabetes mellitus ([facility])    No Known Allergies  Current Outpatient Medications on File Prior to Visit  Medication Sig Dispense Refill  . atorvastatin (LIPITOR) 80 MG tablet Take 1 tablet (80 mg total) by mouth daily. 90 tablet 2  . Blood Glucose Monitoring Suppl (TRUE METRIX METER) w/Device KIT Use to check blood sugar TID. E11.65 (Patient taking differently: 1 each by  Other route in the morning, at noon, and at bedtime. Use to check blood sugar TID. E11.65) 1 kit 0  . carvedilol (COREG) 25 MG tablet Take 1 tablet (25 mg total) by mouth 2 (two) times daily with a meal. 180 tablet 0  . cholecalciferol (VITAMIN D3) 25 MCG (1000 UNIT) tablet Take 1,000 Units by mouth daily.    Marland Kitchen glipiZIDE (GLUCOTROL) 5 MG tablet Take 1 tablet (5 mg total) by mouth 2 (two) times daily before a meal. 60 tablet 3  . glucose blood test strip Use as instructed to check TID. E11.65 (Patient taking differently: 1 each by Other route in the morning, at noon, and at bedtime. Use as instructed to check TID. E11.65) 100 each 11  . insulin glargine (LANTUS) 100 UNIT/ML Solostar Pen Inject 22 Units into the skin daily. (Patient taking differently: Inject 24 Units into the skin daily. ) 15 mL   . Insulin Pen Needle (TRUEPLUS 5-BEVEL PEN NEEDLES) 32G X 4 MM MISC Use to inject insulin daily. (Patient taking differently: 1 each by Other route daily. Use to inject insulin daily.) 100 each 11  . lisinopril (ZESTRIL) 40 MG tablet Take 1 tablet (40 mg total) by mouth daily. 30 tablet 2  . omeprazole (PRILOSEC) 40 MG capsule Take 1 capsule (40 mg total) by mouth daily. 90 capsule 3  .  TRUEplus Lancets 28G MISC Use to check blood sugar three times daily. E11.65 (Patient taking differently: 1 each by Other route in the morning, at noon, and at bedtime. Use to check blood sugar three times daily. E11.65) 100 each 11   No current facility-administered medications on file prior to visit.    Observations/Objective: NAD. Speaking clearly.  Work of breathing normal.  Alert and oriented. Mood appropriate.   Assessment and Plan: 1. Uncontrolled type 2 diabetes mellitus with hyperglycemia ([facility]) Prior A1c showed poor glycemic control. Lantus was increased from 22 to 24 units at home based on AM fasting CBGs and patient reports fasting CBGs now <130. Has continued Glipizide as well. Will plan to repeat A1c and  adjust medication regimen as appropriate.  Counseled on Diabetic diet, my plate method, 131 minutes of moderate intensity exercise/week Blood sugar logs with fasting goals of 80-120 mg/dl, random of less than 180 and in the event of sugars less than 60 mg/dl or greater than 400 mg/dl encouraged to notify the clinic. Advised on the need for annual eye exams, annual foot exams, Pneumonia vaccine. - Hemoglobin A1c; Future - glipiZIDE (GLUCOTROL) 5 MG tablet; Take 1 tablet (5 mg total) by mouth 2 (two) times daily before a meal.  Dispense: 180 tablet; Refill: 1  2. Essential hypertension BPs seem fairly well controlled. No red flag symptoms. Continue current regimen.  - carvedilol (COREG) 25 MG tablet; Take 1 tablet (25 mg total) by mouth 2 (two) times daily with a meal.  Dispense: 180 tablet; Refill: 1  3. Need for hepatitis C screening test - Hepatitis C Antibody; Future  4. Erectile dysfunction, unspecified erectile dysfunction type Likely worsened by co-morbidities. Patient does not take any nitrates. Discussed never to mix these medications. Counseled on risk of priapism and seek emergency care for this.  - sildenafil (VIAGRA) 100 MG tablet; Take 0.5-1 tablets (50-100 mg total) by mouth daily as needed for erectile dysfunction.  Dispense: 10 tablet; Refill: 11   Follow Up Instructions: Lab visit, 3 month f/u for chronic medical conditions    I discussed the assessment and treatment plan with the patient. The patient was provided an opportunity to ask questions and all were answered. The patient agreed with the plan and demonstrated an understanding of the instructions.   The patient was advised to call back or seek an in-person evaluation if the symptoms worsen or if the condition fails to improve as anticipated.     I provided 26 minutes total of non-face-to-face time during this encounter including median intraservice time, reviewing previous notes, investigations, ordering  medications, medical decision making, coordinating care and patient verbalized understanding at the end of the visit.    Phill Myron, D.O. Primary Care at [facility]  11/30/2019, 9:29 AM

## 2019-12-01 MED FILL — ?BASAGLAR 100 UNITS/ML KWPE: 100 | 33 days supply | Qty: 6 | Fill #3

## 2019-12-02 ENCOUNTER — Other Ambulatory Visit: Payer: Self-pay

## 2019-12-02 ENCOUNTER — Ambulatory Visit
Admission: EM | Admit: 2019-12-02 | Discharge: 2019-12-02 | Disposition: A | Discharge status: Home / Self Care | Payer: Self-pay | Attending: Family Medicine | Admitting: Family Medicine

## 2019-12-02 ENCOUNTER — Encounter: Payer: Self-pay | Admitting: Emergency Medicine

## 2019-12-02 DIAGNOSIS — L089 Local infection of the skin and subcutaneous tissue, unspecified: Secondary | ICD-10-CM | POA: Insufficient documentation

## 2019-12-02 DIAGNOSIS — R030 Elevated blood-pressure reading, without diagnosis of hypertension: Secondary | ICD-10-CM | POA: Insufficient documentation

## 2019-12-02 DIAGNOSIS — B9689 Other specified bacterial agents as the cause of diseases classified elsewhere: Secondary | ICD-10-CM | POA: Insufficient documentation

## 2019-12-02 MED ORDER — SULFAMETHOXAZOLE-TRIMETHOPRIM 800-160 MG PO TABS
1.0000 | ORAL_TABLET | Freq: Two times a day (BID) | ORAL | 0 refills | Status: DC
Start: 2019-12-02 — End: 2020-03-15

## 2019-12-02 MED ORDER — CLONIDINE HCL 0.1 MG PO TABS
0.1000 mg | ORAL_TABLET | Freq: Once | ORAL | Status: AC
  Administered 2019-12-02: 0.1 mg via ORAL

## 2019-12-02 MED ORDER — MUPIROCIN 2 % EX OINT
1.0000 | TOPICAL_OINTMENT | Freq: Three times a day (TID) | CUTANEOUS | 1 refills | Status: DC
Start: 2019-12-02 — End: 2020-03-15

## 2019-12-02 MED FILL — ?SULFAMETHOXAZOLE-TMP DS TB: 800-160 | 10 days supply | Qty: 20 | Fill #0

## 2019-12-02 MED FILL — MUPIROCIN 2% OINTMENT: 2 | 7 days supply | Qty: 22 | Fill #0

## 2019-12-02 NOTE — ED Provider Notes (Signed)
[facility]    CSN: [id number] Arrival date & time: 12/02/19  1028      History   Chief Complaint Chief Complaint  Patient presents with  . Abscess    HPI Randy [name]. is a 39 y.o. male.   HPI Patient with a history of uncontrolled diabetes presents today with 3 pustule lesions located on the outer portion of his left scrotum.  Lesions have been present for 2 weeks.  Patient denies a history of recurrent patient is cyst or abscess. He endorses that he was able to on roof one of the lesions recently and obtained a gross amount of yellowish puslike discharge.  He has not had fever however continues to have tenderness which is worsened by tactile stimulation. Afebrile today.  Patient's blood pressure is elevated today on arrival. He is prescribed BP medication and has not taken yet today. Denies CP or SOB. Past Medical History:  Diagnosis Date  . HLD (hyperlipidemia)   . Hypertension   . Pancreatitis   . Type II diabetes mellitus [facility])     Patient Active Problem List   Diagnosis Date Noted  . Hyperlipidemia 06/01/2019  . Microalbuminuria due to type 2 diabetes mellitus ([place]) 06/01/2019  . Essential hypertension 05/31/2019  . Uncontrolled diabetes mellitus ([place]) 08/31/2015  . Marijuana abuse 08/31/2015    Past Surgical History:  Procedure Laterality Date  . NO PAST SURGERIES         Home Medications    Prior to Admission medications   Medication Sig Start Date End Date Taking? Authorizing Provider  atorvastatin (LIPITOR) 80 MG tablet Take 1 tablet (80 mg total) by mouth daily. 07/28/19   Gildardo Pounds, NP  Blood Glucose Monitoring Suppl (TRUE METRIX METER) w/Device KIT Use to check blood sugar TID. E11.65 Patient taking differently: 1 each by Other route in the morning, at noon, and at bedtime. Use to check blood sugar TID. E11.65 06/10/19   Charlott Rakes, MD  carvedilol (COREG) 25 MG tablet Take 1 tablet (25 mg total) by mouth 2 (two) times  daily with a meal. 11/30/19   Nicolette Bang, DO  cholecalciferol (VITAMIN D3) 25 MCG (1000 UNIT) tablet Take 1,000 Units by mouth daily.    [provider]  glipiZIDE (GLUCOTROL) 5 MG tablet Take 1 tablet (5 mg total) by mouth 2 (two) times daily before a meal. 11/30/19   Nicolette Bang, DO  glucose blood test strip Use as instructed to check TID. E11.65 Patient taking differently: 1 each by Other route in the morning, at noon, and at bedtime. Use as instructed to check TID. E11.65 06/10/19   Charlott Rakes, MD  insulin glargine (LANTUS) 100 UNIT/ML Solostar Pen Inject 22 Units into the skin daily. Patient taking differently: Inject 24 Units into the skin daily.  09/01/19 11/30/19  Nicolette Bang, DO  Insulin Pen Needle (TRUEPLUS 5-BEVEL PEN NEEDLES) 32G X 4 MM MISC Use to inject insulin daily. Patient taking differently: 1 each by Other route daily. Use to inject insulin daily. 06/10/19   Charlott Rakes, MD  lisinopril (ZESTRIL) 40 MG tablet Take 1 tablet (40 mg total) by mouth daily. 07/28/19   Gildardo Pounds, NP  omeprazole (PRILOSEC) 40 MG capsule Take 1 capsule (40 mg total) by mouth daily. 10/07/19   Mauri Pole, MD  sildenafil (VIAGRA) 100 MG tablet Take 0.5-1 tablets (50-100 mg total) by mouth daily as needed for erectile dysfunction. 11/30/19   Nicolette Bang,  DO  TRUEplus Lancets 28G MISC Use to check blood sugar three times daily. E11.65 Patient taking differently: 1 each by Other route in the morning, at noon, and at bedtime. Use to check blood sugar three times daily. E11.65 06/10/19   Charlott Rakes, MD    Family History Family History  Problem Relation Age of Onset  . Hypertension Mother   . Diabetes Father   . Hypertension Father   . Diabetes Brother   . Diabetes Maternal Grandmother   . Colon cancer Neg Hx   . Esophageal cancer Neg Hx   . Rectal cancer Neg Hx   . Stomach cancer Neg Hx     Social History Social  History   Tobacco Use  . Smoking status: Former Smoker    Packs/day: 0.33    Years: 17.00    Pack years: 5.61    Types: Cigarettes    Quit date: 12/26/2011    Years since quitting: 7.9  . Smokeless tobacco: Never Used  Vaping Use  . Vaping Use: Never used  Substance Use Topics  . Alcohol use: No    Comment: none in6-7 years  . Drug use: Yes    Types: Marijuana    Comment: Nov 2020     Allergies   Patient has no known allergies.   Review of Systems Review of Systems Pertinent negatives listed in HPI  Physical Exam Triage Vital Signs ED Triage Vitals [12/02/19 1034]  Enc Vitals Group     BP (!) 167/109     Pulse Rate 78     Resp 16     Temp 98.1 F (36.7 C)     Temp Source Oral     SpO2 97 %     Weight      Height      Head Circumference      Peak Flow      Pain Score 6     Pain Loc      Pain Edu?      Excl. in [place]?    No data found.  Updated Vital Signs BP (!) 172/113 (BP Location: Left Arm)   Pulse 78   Temp 98.1 F (36.7 C) (Oral)   Resp 16   SpO2 97%   Visual Acuity Right Eye Distance:   Left Eye Distance:   Bilateral Distance:    Right Eye Near:   Left Eye Near:    Bilateral Near:     Physical Exam HENT:     Head: Normocephalic.  Cardiovascular:     Rate and Rhythm: Normal rate and regular rhythm.  Pulmonary:     Effort: Pulmonary effort is normal.     Breath sounds: Normal breath sounds and air entry.  Musculoskeletal:     Cervical back: Normal range of motion.  Skin:         Comments: 3 fluid filles papules left scrotum. Tender with palpation. No scrotal edema  Neurological:     General: No focal deficit present.     Mental Status: He is alert.  Psychiatric:        Mood and Affect: Mood normal.      UC Treatments / Results  Labs (all labs ordered are listed, but only abnormal results are displayed) Labs Reviewed - No data to display  EKG   Radiology No results found.  Procedures Procedures (including critical  care time)  Medications Ordered in UC Medications  cloNIDine (CATAPRES) tablet 0.1 mg (0.1 mg Oral Given 12/02/19 1121)  Initial Impression / Assessment and Plan / UC Course  I have reviewed the triage vital signs and the nursing notes.  Pertinent labs & imaging results that were available during my care of the patient were reviewed by me and considered in my medical decision making (see chart for details).     Three pustular/blister type lesion on the left scrotum. Unroofed one lesion and collected a viral and bacterial culture. Given history of uncontrolled diabetes and fluid expressed from lesions has the appearance of puss, will cover with bactrim and mupirocin ointment for staph/MRSA coverage.  Accelerated hypertension treated with Clonidine. No improvement in readings while in clinic. Asymptomatic. Advised to take home medications and continue to monitor BP at home. An After Visit Summary was printed and given to the patient/family. Precautions discussed. Red flags discussed. Questions invited and answered. They voiced understanding and agreement.    Final Clinical Impressions(s) / UC Diagnoses   Final diagnoses:  Superficial bacterial skin infection  Elevated blood pressure reading     Discharge Instructions     I collected both a bacterial and a viral wound culture today.  Those results are available within 7 days and someone will contact you if additional treatment is warranted.  For now I am starting you on Bactrim twice daily for total of 10 days.  Also would like for you to apply mupirocin ointment to the lesions 3 times daily until they completely resolve.  Your medications have been sent to your pharmacy.  Your blood pressure is very elevated today.  Please follow-up with your primary care as your blood pressure readings today both were in a critical level that warrant follow-up.    ED Prescriptions    Medication Sig Dispense Auth. Provider    sulfamethoxazole-trimethoprim (BACTRIM DS) 800-160 MG tablet Take 1 tablet by mouth 2 (two) times daily. 20 tablet Scot Jun, FNP   mupirocin ointment (BACTROBAN) 2 % Apply 1 application topically 3 (three) times daily. 22 g Scot Jun, FNP     PDMP not reviewed this encounter.   Scot Jun, FNP 12/02/19 1527

## 2019-12-02 NOTE — ED Notes (Addendum)
Patient able to ambulate independently  Patient states at last BP check he needs to leave, provider okay'd patient for discharge.

## 2019-12-02 NOTE — ED Triage Notes (Signed)
Pt presents to [facility] for growth/abscess to left groin x 2 weeks.  States he tried to pop it a few days ago, with very little drainage.

## 2019-12-02 NOTE — Discharge Instructions (Addendum)
I collected both a bacterial and a viral wound culture today.  Those results are available within 7 days and someone will contact you if additional treatment is warranted.  For now I am starting you on Bactrim twice daily for total of 10 days.  Also would like for you to apply mupirocin ointment to the lesions 3 times daily until they completely resolve.  Your medications have been sent to your pharmacy.  Your blood pressure is very elevated today.  Please follow-up with your primary care as your blood pressure readings today both were in a critical level that warrant follow-up.

## 2019-12-04 LAB — HSV CULTURE AND TYPING

## 2019-12-04 LAB — AEROBIC CULTURE W GRAM STAIN (SUPERFICIAL SPECIMEN): Special Requests: NORMAL

## 2019-12-07 ENCOUNTER — Other Ambulatory Visit (INDEPENDENT_AMBULATORY_CARE_PROVIDER_SITE_OTHER): Payer: Self-pay

## 2019-12-07 ENCOUNTER — Other Ambulatory Visit: Payer: Self-pay

## 2019-12-07 DIAGNOSIS — E1165 Type 2 diabetes mellitus with hyperglycemia: Secondary | ICD-10-CM

## 2019-12-07 DIAGNOSIS — Z1159 Encounter for screening for other viral diseases: Secondary | ICD-10-CM

## 2019-12-07 DIAGNOSIS — I10 Essential (primary) hypertension: Secondary | ICD-10-CM

## 2019-12-08 LAB — BASIC METABOLIC PANEL
BUN/Creatinine Ratio: 17 (ref 9–20)
BUN: 18 mg/dL (ref 6–24)
CO2: 23 mmol/L (ref 20–29)
Calcium: 8.9 mg/dL (ref 8.7–10.2)
Chloride: 98 mmol/L (ref 96–106)
Creatinine, Ser: 1.05 mg/dL (ref 0.76–1.27)
GFR calc Af Amer: 102 mL/min/{1.73_m2} (ref >59)
GFR calc non Af Amer: 88 mL/min/{1.73_m2} (ref >59)
Glucose: 366 mg/dL — ABNORMAL HIGH (ref 65–99)
Potassium: 4.7 mmol/L (ref 3.5–5.2)
Sodium: 133 mmol/L — ABNORMAL LOW (ref 134–144)

## 2019-12-08 LAB — HEMOGLOBIN A1C
Est. average glucose Bld gHb Est-mCnc: 246 mg/dL
Hgb A1c MFr Bld: 10.2 % — ABNORMAL HIGH (ref 4.8–5.6)

## 2019-12-08 LAB — HEPATITIS C ANTIBODY: Hep C Virus Ab: <0.1 s/co ratio (ref 0.0–0.9)

## 2019-12-09 MED FILL — ?GLIPIZIDE 5MG TABLET: 5 | 30 days supply | Qty: 60 | Fill #0

## 2019-12-09 MED FILL — LISINOPRIL 40 MG TABLET: 40 | 30 days supply | Qty: 30 | Fill #1

## 2019-12-09 MED FILL — SILDENAFIL CITRATE 100 MG T: 100 | 30 days supply | Qty: 10 | Fill #0

## 2019-12-09 MED FILL — ?CARVEDILOL 25 MG TABLET: 25 | 30 days supply | Qty: 60 | Fill #0

## 2019-12-09 MED FILL — $LANTUS SOLOSTAR 100 UNITS/: 100 | 83 days supply | Qty: 15 | Fill #3

## 2020-01-18 ENCOUNTER — Ambulatory Visit: Payer: Self-pay | Admitting: Pharmacist

## 2020-03-16 ENCOUNTER — Ambulatory Visit: Payer: Self-pay | Admitting: Internal Medicine

## 2020-03-16 DIAGNOSIS — E1165 Type 2 diabetes mellitus with hyperglycemia: Secondary | ICD-10-CM

## 2020-03-16 DIAGNOSIS — E1129 Type 2 diabetes mellitus with other diabetic kidney complication: Secondary | ICD-10-CM

## 2020-03-16 DIAGNOSIS — E782 Mixed hyperlipidemia: Secondary | ICD-10-CM

## 2020-03-16 DIAGNOSIS — I10 Essential (primary) hypertension: Secondary | ICD-10-CM

## 2020-03-22 ENCOUNTER — Other Ambulatory Visit (HOSPITAL_COMMUNITY): Payer: Self-pay | Admitting: Emergency Medicine

## 2020-03-22 ENCOUNTER — Other Ambulatory Visit: Payer: Self-pay

## 2020-03-22 ENCOUNTER — Emergency Department (HOSPITAL_COMMUNITY)
Admission: EM | Admit: 2020-03-22 | Discharge: 2020-03-22 | Disposition: A | Discharge status: Home / Self Care | Payer: Self-pay | Attending: Emergency Medicine | Admitting: Emergency Medicine

## 2020-03-22 DIAGNOSIS — E1165 Type 2 diabetes mellitus with hyperglycemia: Secondary | ICD-10-CM

## 2020-03-22 DIAGNOSIS — Z79899 Other long term (current) drug therapy: Secondary | ICD-10-CM | POA: Insufficient documentation

## 2020-03-22 DIAGNOSIS — Z87891 Personal history of nicotine dependence: Secondary | ICD-10-CM | POA: Insufficient documentation

## 2020-03-22 DIAGNOSIS — R112 Nausea with vomiting, unspecified: Secondary | ICD-10-CM | POA: Insufficient documentation

## 2020-03-22 DIAGNOSIS — Z794 Long term (current) use of insulin: Secondary | ICD-10-CM | POA: Insufficient documentation

## 2020-03-22 DIAGNOSIS — E1129 Type 2 diabetes mellitus with other diabetic kidney complication: Secondary | ICD-10-CM | POA: Insufficient documentation

## 2020-03-22 DIAGNOSIS — I10 Essential (primary) hypertension: Secondary | ICD-10-CM | POA: Insufficient documentation

## 2020-03-22 DIAGNOSIS — R1084 Generalized abdominal pain: Secondary | ICD-10-CM | POA: Insufficient documentation

## 2020-03-22 DIAGNOSIS — Z7984 Long term (current) use of oral hypoglycemic drugs: Secondary | ICD-10-CM | POA: Insufficient documentation

## 2020-03-22 DIAGNOSIS — M545 Low back pain, unspecified: Secondary | ICD-10-CM | POA: Insufficient documentation

## 2020-03-22 LAB — COMPREHENSIVE METABOLIC PANEL
ALT: 15 U/L (ref 0–44)
AST: 22 U/L (ref 15–41)
Albumin: 3.2 g/dL — ABNORMAL LOW (ref 3.5–5.0)
Alkaline Phosphatase: 97 U/L (ref 38–126)
Anion gap: 8 (ref 5–15)
BUN: 14 mg/dL (ref 6–20)
CO2: 24 mmol/L (ref 22–32)
Calcium: 8.9 mg/dL (ref 8.9–10.3)
Chloride: 101 mmol/L (ref 98–111)
Creatinine, Ser: 1.14 mg/dL (ref 0.61–1.24)
GFR, Estimated: >60 mL/min (ref >60)
Glucose, Bld: 338 mg/dL — ABNORMAL HIGH (ref 70–99)
Potassium: 4.4 mmol/L (ref 3.5–5.1)
Sodium: 133 mmol/L — ABNORMAL LOW (ref 135–145)
Total Bilirubin: 0.7 mg/dL (ref 0.3–1.2)
Total Protein: 7.1 g/dL (ref 6.5–8.1)

## 2020-03-22 LAB — CBC
HCT: 35.9 % — ABNORMAL LOW (ref 39.0–52.0)
Hemoglobin: 11.4 g/dL — ABNORMAL LOW (ref 13.0–17.0)
MCH: 26.8 pg (ref 26.0–34.0)
MCHC: 31.8 g/dL (ref 30.0–36.0)
MCV: 84.5 fL (ref 80.0–100.0)
Platelets: 291 10*3/uL (ref 150–400)
RBC: 4.25 MIL/uL (ref 4.22–5.81)
RDW: 11.9 % (ref 11.5–15.5)
WBC: 6.6 10*3/uL (ref 4.0–10.5)
nRBC: 0 % (ref 0.0–0.2)

## 2020-03-22 LAB — LIPASE, BLOOD: Lipase: 82 U/L — ABNORMAL HIGH (ref 11–51)

## 2020-03-22 LAB — URINALYSIS, ROUTINE W REFLEX MICROSCOPIC
Bacteria, UA: NONE SEEN
Bilirubin Urine: NEGATIVE
Glucose, UA: >=500 mg/dL — AB
Ketones, ur: NEGATIVE mg/dL
Leukocytes,Ua: NEGATIVE
Nitrite: NEGATIVE
Protein, ur: >=300 mg/dL — AB
Specific Gravity, Urine: 1.018 (ref 1.005–1.030)
pH: 6 (ref 5.0–8.0)

## 2020-03-22 LAB — CBG MONITORING, ED: Glucose-Capillary: 319 mg/dL — ABNORMAL HIGH (ref 70–99)

## 2020-03-22 MED ORDER — ONDANSETRON HCL 4 MG/2ML IJ SOLN
4.0000 mg | Freq: Once | INTRAMUSCULAR | Status: AC
  Administered 2020-03-22: 4 mg via INTRAVENOUS
  Filled 2020-03-22: qty 2

## 2020-03-22 MED ORDER — HYDROMORPHONE HCL 1 MG/ML IJ SOLN
1.0000 mg | Freq: Once | INTRAMUSCULAR | Status: AC
  Administered 2020-03-22: 1 mg via INTRAVENOUS
  Filled 2020-03-22: qty 1

## 2020-03-22 MED ORDER — GLIPIZIDE 5 MG PO TABS: 5.0000 mg | ORAL_TABLET | Freq: Two times a day (BID) | ORAL | 2 refills | Status: DC

## 2020-03-22 MED ORDER — CARVEDILOL 25 MG PO TABS: 25.0000 mg | ORAL_TABLET | Freq: Two times a day (BID) | ORAL | 2 refills | Status: DC

## 2020-03-22 MED ORDER — SODIUM CHLORIDE 0.9 % IV BOLUS
1000.0000 mL | Freq: Once | INTRAVENOUS | Status: AC
  Administered 2020-03-22: 1000 mL via INTRAVENOUS

## 2020-03-22 MED ORDER — LISINOPRIL 40 MG PO TABS: 40.0000 mg | ORAL_TABLET | Freq: Every day | ORAL | 2 refills | Status: DC

## 2020-03-22 MED ORDER — HYDROCODONE-ACETAMINOPHEN 5-325 MG PO TABS: 1.0000 | ORAL_TABLET | Freq: Four times a day (QID) | ORAL | 0 refills | Status: DC | PRN

## 2020-03-22 MED ORDER — ONDANSETRON 4 MG PO TBDP
4.0000 mg | ORAL_TABLET | Freq: Three times a day (TID) | ORAL | 0 refills | Status: DC | PRN
Start: 2020-03-22 — End: 2021-02-04

## 2020-03-22 MED ORDER — CARVEDILOL 12.5 MG PO TABS
25.0000 mg | ORAL_TABLET | Freq: Once | ORAL | Status: AC
  Administered 2020-03-22: 25 mg via ORAL
  Filled 2020-03-22: qty 2

## 2020-03-22 MED ORDER — LISINOPRIL 20 MG PO TABS
40.0000 mg | ORAL_TABLET | Freq: Once | ORAL | Status: AC
  Administered 2020-03-22: 40 mg via ORAL
  Filled 2020-03-22: qty 2

## 2020-03-22 MED FILL — SILDENAFIL CITRATE 100 MG T: 100 | 9 days supply | Qty: 3 | Fill #0

## 2020-03-22 MED FILL — CARVEDILOL 25 MG TABLET: 25 | 30 days supply | Qty: 60 | Fill #0

## 2020-03-22 MED FILL — $LANTUS SOLOSTAR 100 UNITS/: 100 | 83 days supply | Qty: 15 | Fill #4

## 2020-03-22 MED FILL — ONDANSETRON HCL 4 MG TABLET: 4 | 7 days supply | Qty: 20 | Fill #0

## 2020-03-22 MED FILL — LISINOPRIL 40 MG TABS: 40 | 30 days supply | Qty: 30 | Fill #0

## 2020-03-22 MED FILL — glipiZIDE 5 MG TABS: 5 | 30 days supply | Qty: 60 | Fill #0

## 2020-03-22 NOTE — ED Provider Notes (Signed)
[place] EMERGENCY DEPARTMENT Provider Note   CSN: [id number] Arrival date & time: 03/22/20  9767     History Chief Complaint  Patient presents with  . Emesis  . Abdominal Pain  . Hypertension  . Hyperglycemia    Randy R Yu Peggs. is a 39 y.o. male.  He has a history of hypertension and has not taken his blood pressure medicine in about a month due to finances.  He is also complaining of some back and abdominal pain that started a few days ago but worsened last evening.  Associated with nausea and vomiting.  He feels this is his pancreatitis which has had flareups before.  He was referred to GI who we saw few months ago and he said they did not find a reason for his pancreatitis.  No fevers or chills.  No hematemesis or melena.  The history is provided by the patient.  Abdominal Pain Pain location:  Generalized Pain quality: aching   Pain radiates to:  Back Pain severity:  Severe Onset quality:  Gradual Timing:  Constant Progression:  Worsening Chronicity:  Recurrent Context: not alcohol use and not trauma   Relieved by:  None tried Worsened by:  Vomiting Ineffective treatments:  None tried Associated symptoms: nausea and vomiting   Associated symptoms: no chest pain, no chills, no cough, no diarrhea, no dysuria, no fever, no hematemesis, no hematochezia, no hematuria, no shortness of breath and no sore throat   Hypertension Associated symptoms include abdominal pain. Pertinent negatives include no chest pain, no headaches and no shortness of breath.  Hyperglycemia Associated symptoms: abdominal pain, nausea and vomiting   Associated symptoms: no chest pain, no dysuria, no fever and no shortness of breath        Past Medical History:  Diagnosis Date  . HLD (hyperlipidemia)   . Hypertension   . Pancreatitis   . Type II diabetes mellitus [facility])     Patient Active Problem List   Diagnosis Date Noted  . Hyperlipidemia 06/01/2019  .  Microalbuminuria due to type 2 diabetes mellitus ([place]) 06/01/2019  . Essential hypertension 05/31/2019  . Uncontrolled diabetes mellitus ([place]) 08/31/2015  . Marijuana abuse 08/31/2015    Past Surgical History:  Procedure Laterality Date  . NO PAST SURGERIES         Family History  Problem Relation Age of Onset  . Hypertension Mother   . Diabetes Father   . Hypertension Father   . Diabetes Brother   . Diabetes Maternal Grandmother   . Colon cancer Neg Hx   . Esophageal cancer Neg Hx   . Rectal cancer Neg Hx   . Stomach cancer Neg Hx     Social History   Tobacco Use  . Smoking status: Former Smoker    Packs/day: 0.33    Years: 17.00    Pack years: 5.61    Types: Cigarettes    Quit date: 12/26/2011    Years since quitting: 8.2  . Smokeless tobacco: Never Used  Vaping Use  . Vaping Use: Never used  Substance Use Topics  . Alcohol use: No    Comment: none in6-7 years  . Drug use: Yes    Types: Marijuana    Comment: Nov 2020    Home Medications Prior to Admission medications   Medication Sig Start Date End Date Taking? Authorizing Provider  atorvastatin (LIPITOR) 80 MG tablet Take 1 tablet (80 mg total) by mouth daily. 07/28/19   Gildardo Pounds, NP  Blood Glucose Monitoring Suppl (TRUE METRIX METER) w/Device KIT Use to check blood sugar TID. E11.65 Patient taking differently: 1 each by Other route in the morning, at noon, and at bedtime. Use to check blood sugar TID. E11.65 06/10/19   Charlott Rakes, MD  carvedilol (COREG) 25 MG tablet Take 1 tablet (25 mg total) by mouth 2 (two) times daily with a meal. 11/30/19   Nicolette Bang, DO  cholecalciferol (VITAMIN D3) 25 MCG (1000 UNIT) tablet Take 1,000 Units by mouth daily.    [provider]  glipiZIDE (GLUCOTROL) 5 MG tablet Take 1 tablet (5 mg total) by mouth 2 (two) times daily before a meal. 11/30/19   Nicolette Bang, DO  glucose blood test strip Use as instructed to check TID.  E11.65 Patient taking differently: 1 each by Other route in the morning, at noon, and at bedtime. Use as instructed to check TID. E11.65 06/10/19   Charlott Rakes, MD  insulin glargine (LANTUS) 100 UNIT/ML Solostar Pen Inject 22 Units into the skin daily. Patient taking differently: Inject 24 Units into the skin daily.  09/01/19 11/30/19  Nicolette Bang, DO  Insulin Pen Needle (TRUEPLUS 5-BEVEL PEN NEEDLES) 32G X 4 MM MISC Use to inject insulin daily. Patient taking differently: 1 each by Other route daily. Use to inject insulin daily. 06/10/19   Charlott Rakes, MD  lisinopril (ZESTRIL) 40 MG tablet Take 1 tablet (40 mg total) by mouth daily. 07/28/19   Gildardo Pounds, NP  omeprazole (PRILOSEC) 40 MG capsule Take 1 capsule (40 mg total) by mouth daily. 10/07/19   Mauri Pole, MD  sildenafil (VIAGRA) 100 MG tablet Take 0.5-1 tablets (50-100 mg total) by mouth daily as needed for erectile dysfunction. 11/30/19   Nicolette Bang, DO  TRUEplus Lancets 28G MISC Use to check blood sugar three times daily. E11.65 Patient taking differently: 1 each by Other route in the morning, at noon, and at bedtime. Use to check blood sugar three times daily. E11.65 06/10/19   Charlott Rakes, MD    Allergies    Patient has no known allergies.  Review of Systems   Review of Systems  Constitutional: Negative for chills and fever.  HENT: Negative for sore throat.   Eyes: Negative for visual disturbance.  Respiratory: Negative for cough and shortness of breath.   Cardiovascular: Negative for chest pain.  Gastrointestinal: Positive for abdominal pain, nausea and vomiting. Negative for diarrhea, hematemesis and hematochezia.  Genitourinary: Negative for dysuria and hematuria.  Musculoskeletal: Positive for back pain.  Skin: Negative for rash.  Neurological: Negative for headaches.    Physical Exam Updated Vital Signs BP (!) 234/129 (BP Location: Right Arm)   Pulse 79   Temp 98 F  (36.7 C) (Oral)   Resp 18   SpO2 100%   Physical Exam Vitals and nursing note reviewed.  Constitutional:      General: He is not in acute distress.    Appearance: Normal appearance. He is well-developed.  HENT:     Head: Normocephalic and atraumatic.  Eyes:     Conjunctiva/sclera: Conjunctivae normal.  Cardiovascular:     Rate and Rhythm: Normal rate and regular rhythm.     Heart sounds: No murmur heard.   Pulmonary:     Effort: Pulmonary effort is normal. No respiratory distress.     Breath sounds: Normal breath sounds.  Abdominal:     General: Abdomen is flat.     Palpations: Abdomen is soft.  Tenderness: There is generalized abdominal tenderness. There is no guarding or rebound.  Musculoskeletal:        General: No deformity or signs of injury. Normal range of motion.     Cervical back: Neck supple.  Skin:    General: Skin is warm and dry.     Capillary Refill: Capillary refill takes less than 2 seconds.  Neurological:     General: No focal deficit present.     Mental Status: He is alert.     ED Results / Procedures / Treatments   Labs (all labs ordered are listed, but only abnormal results are displayed) Labs Reviewed  LIPASE, BLOOD - Abnormal; Notable for the following components:      Result Value   Lipase 82 (*)    All other components within normal limits  COMPREHENSIVE METABOLIC PANEL - Abnormal; Notable for the following components:   Sodium 133 (*)    Glucose, Bld 338 (*)    Albumin 3.2 (*)    All other components within normal limits  CBC - Abnormal; Notable for the following components:   Hemoglobin 11.4 (*)    HCT 35.9 (*)    All other components within normal limits  URINALYSIS, ROUTINE W REFLEX MICROSCOPIC - Abnormal; Notable for the following components:   Color, Urine STRAW (*)    Glucose, UA >=500 (*)    Hgb urine dipstick MODERATE (*)    Protein, ur >=300 (*)    All other components within normal limits  CBG MONITORING, ED - Abnormal;  Notable for the following components:   Glucose-Capillary 319 (*)    All other components within normal limits    EKG None  Radiology No results found.  Procedures Procedures (including critical care time)  Medications Ordered in ED Medications  HYDROmorphone (DILAUDID) injection 1 mg (1 mg Intravenous Given 03/22/20 0851)  sodium chloride 0.9 % bolus 1,000 mL (0 mLs Intravenous Stopped 03/22/20 1001)  ondansetron (ZOFRAN) injection 4 mg (4 mg Intravenous Given 03/22/20 0851)  carvedilol (COREG) tablet 25 mg (25 mg Oral Given 03/22/20 0851)  lisinopril (ZESTRIL) tablet 40 mg (40 mg Oral Given 03/22/20 4431)    ED Course  I have reviewed the triage vital signs and the nursing notes.  Pertinent labs & imaging results that were available during my care of the patient were reviewed by me and considered in my medical decision making (see chart for details).  Clinical Course as of Mar 23 1727  Wed Mar 22, 2020  5400 Discussed with TOC.  They said it was on the [facility] to [place] they will bring him down a week to make sure he is back on his medicine.  Reviewed results with patient and he is comfortable plan for discharge and restarting his medicine.  We will also provide a prescription for short course of some pain medicine.   [MB]    Clinical Course User Index [MB] Hayden Rasmussen, MD   MDM Rules/Calculators/A&P                         This patient complains of back and abdominal pain, nausea vomiting, blood pressure; this involves an extensive number of treatment Options and is a complaint that carries with it a high risk of complications and Morbidity. The differential includes hypertensive urgency, pancreatitis, gastritis, musculoskeletal pain, gastroenteritis  I ordered, reviewed and interpreted labs, which included CBC with normal white count, stable low hemoglobin, chemistries with elevated glucose,  normal renal function, normal LFTs, lipase mildly elevated 82,  urinalysis without gross signs of infection I ordered medication written pain medicine, nausea medication, his oral blood pressure medication Previous records obtained and reviewed in epic, was here for pancreatitis in the past with elevated lipase of 600.  Also has been evaluated for hypertensive urgency.  After the interventions stated above, I reevaluated the patient and found pain to be improved with him to be resting quietly.  Blood pressure somewhat better.  Have discussed with transitions of care and they were able to get him refills of his blood pressure medicine.  We will prescribe him a short course of some pain medicine.  Return instructions discussed.   Final Clinical Impression(s) / ED Diagnoses Final diagnoses:  Acute low back pain without sciatica, unspecified back pain laterality  Generalized abdominal pain  Non-intractable vomiting with nausea, unspecified vomiting type  Primary hypertension    Rx / DC Orders ED Discharge Orders         Ordered    carvedilol (COREG) 25 MG tablet  2 times daily with meals        03/22/20 0939    lisinopril (ZESTRIL) 40 MG tablet  Daily        03/22/20 0939    glipiZIDE (GLUCOTROL) 5 MG tablet  2 times daily before meals        03/22/20 0939    ondansetron (ZOFRAN ODT) 4 MG disintegrating tablet  Every 8 hours PRN        03/22/20 0939    HYDROcodone-acetaminophen (NORCO/VICODIN) 5-325 MG tablet  Every 6 hours PRN        03/22/20 1035           Hayden Rasmussen, MD 03/22/20 1731

## 2020-03-22 NOTE — Discharge Instructions (Addendum)
You were seen in the emergency department for nausea vomiting abdominal pain and back pain along with elevated blood pressure.  You had lab work done that showed your sugar to be elevated but your pancreas number was only mildly elevated.  We are restarting you on your blood pressure medications and prescribing some nausea and pain medicine.  Please follow-up with your primary care doctor and your GI doctor.  Return to the emergency department if any worsening or concerning symptoms.

## 2020-03-22 NOTE — ED Triage Notes (Signed)
Pt reports being out of antihypertensive medication x 2-3 weeks. Also reports pancreatitis flare up with abdominal pain and vomiting since last night. Reports compliance with insulin but is still hyperglycemic.

## 2020-03-22 NOTE — Progress Notes (Addendum)
   03/22/20 1008  TOC ED Mini Assessment  TOC Time spent with patient (minutes): 45  PING Used in TOC Assessment No  Admission or Readmission Diverted Yes  Interventions which prevented an admission or readmission Medication Review;Follow-up medical appointment  What brought you to the Emergency Department?  Back pain, medication assistance  Means of departure Car    Alexsandra Shontz J. Lucretia Roers, RN, BSN, [place] [phone]  RNCM set up appointment with [facility] on 11/22 @3 .  Spoke with pt at bedside and advised to please arrive 15 min early and take a picture ID and your current medications.  Pt verbalizes understanding of keeping appointment.   RNCM consulted regarding medication assistance of pt unable to afford medications.  RNCM utilized [facility] petty cash to cover $21 co-pay and filled through [facility].  Rx e-scribed to [facility] and delivered to pt prior to discharge home.  No further RNCM needs identified at this time.

## 2020-03-22 NOTE — ED Notes (Signed)
meds given. Pt denies any allergies

## 2020-03-29 ENCOUNTER — Ambulatory Visit: Payer: Self-pay

## 2020-04-03 ENCOUNTER — Encounter: Payer: Self-pay | Admitting: Family

## 2020-04-03 NOTE — Progress Notes (Signed)
Patient did not show for appointment.   

## 2020-04-04 ENCOUNTER — Emergency Department (HOSPITAL_COMMUNITY)
Admission: EM | Admit: 2020-04-04 | Discharge: 2020-04-04 | Disposition: A | Discharge status: Home / Self Care | Payer: Self-pay | Attending: Emergency Medicine | Admitting: Emergency Medicine

## 2020-04-04 ENCOUNTER — Emergency Department (HOSPITAL_COMMUNITY): Payer: Self-pay

## 2020-04-04 ENCOUNTER — Encounter (HOSPITAL_COMMUNITY): Payer: Self-pay

## 2020-04-04 ENCOUNTER — Other Ambulatory Visit: Payer: Self-pay

## 2020-04-04 DIAGNOSIS — R1030 Lower abdominal pain, unspecified: Secondary | ICD-10-CM

## 2020-04-04 DIAGNOSIS — Z794 Long term (current) use of insulin: Secondary | ICD-10-CM | POA: Insufficient documentation

## 2020-04-04 DIAGNOSIS — Z87891 Personal history of nicotine dependence: Secondary | ICD-10-CM | POA: Insufficient documentation

## 2020-04-04 DIAGNOSIS — Z79899 Other long term (current) drug therapy: Secondary | ICD-10-CM | POA: Insufficient documentation

## 2020-04-04 DIAGNOSIS — E1165 Type 2 diabetes mellitus with hyperglycemia: Secondary | ICD-10-CM | POA: Insufficient documentation

## 2020-04-04 DIAGNOSIS — I10 Essential (primary) hypertension: Secondary | ICD-10-CM

## 2020-04-04 DIAGNOSIS — R739 Hyperglycemia, unspecified: Secondary | ICD-10-CM

## 2020-04-04 LAB — COMPREHENSIVE METABOLIC PANEL
ALT: 15 U/L (ref 0–44)
AST: 21 U/L (ref 15–41)
Albumin: 3.3 g/dL — ABNORMAL LOW (ref 3.5–5.0)
Alkaline Phosphatase: 95 U/L (ref 38–126)
Anion gap: 9 (ref 5–15)
BUN: 19 mg/dL (ref 6–20)
CO2: 23 mmol/L (ref 22–32)
Calcium: 8.7 mg/dL — ABNORMAL LOW (ref 8.9–10.3)
Chloride: 99 mmol/L (ref 98–111)
Creatinine, Ser: 1.44 mg/dL — ABNORMAL HIGH (ref 0.61–1.24)
GFR, Estimated: >60 mL/min (ref >60)
Glucose, Bld: 410 mg/dL — ABNORMAL HIGH (ref 70–99)
Potassium: 4.8 mmol/L (ref 3.5–5.1)
Sodium: 131 mmol/L — ABNORMAL LOW (ref 135–145)
Total Bilirubin: 0.4 mg/dL (ref 0.3–1.2)
Total Protein: 7.1 g/dL (ref 6.5–8.1)

## 2020-04-04 LAB — CBC
HCT: 35.7 % — ABNORMAL LOW (ref 39.0–52.0)
Hemoglobin: 11.2 g/dL — ABNORMAL LOW (ref 13.0–17.0)
MCH: 26.7 pg (ref 26.0–34.0)
MCHC: 31.4 g/dL (ref 30.0–36.0)
MCV: 85 fL (ref 80.0–100.0)
Platelets: 283 10*3/uL (ref 150–400)
RBC: 4.2 MIL/uL — ABNORMAL LOW (ref 4.22–5.81)
RDW: 11.9 % (ref 11.5–15.5)
WBC: 6.3 10*3/uL (ref 4.0–10.5)
nRBC: 0 % (ref 0.0–0.2)

## 2020-04-04 LAB — CBG MONITORING, ED
Glucose-Capillary: 382 mg/dL — ABNORMAL HIGH (ref 70–99)
Glucose-Capillary: 289 mg/dL — ABNORMAL HIGH (ref 70–99)

## 2020-04-04 LAB — URINALYSIS, ROUTINE W REFLEX MICROSCOPIC
Bilirubin Urine: NEGATIVE
Glucose, UA: >=500 mg/dL — AB
Ketones, ur: NEGATIVE mg/dL
Leukocytes,Ua: NEGATIVE
Nitrite: NEGATIVE
Protein, ur: 100 mg/dL — AB
Specific Gravity, Urine: 1.014 (ref 1.005–1.030)
pH: 6 (ref 5.0–8.0)

## 2020-04-04 LAB — LIPASE, BLOOD: Lipase: 56 U/L — ABNORMAL HIGH (ref 11–51)

## 2020-04-04 MED ORDER — LISINOPRIL 20 MG PO TABS
40.0000 mg | ORAL_TABLET | Freq: Once | ORAL | Status: AC
  Administered 2020-04-04: 40 mg via ORAL
  Filled 2020-04-04: qty 2

## 2020-04-04 MED ORDER — MORPHINE SULFATE (PF) 4 MG/ML IV SOLN
4.0000 mg | Freq: Once | INTRAVENOUS | Status: AC
  Administered 2020-04-04: 4 mg via INTRAVENOUS
  Filled 2020-04-04: qty 1

## 2020-04-04 MED ORDER — SODIUM CHLORIDE 0.9 % IV BOLUS
1000.0000 mL | Freq: Once | INTRAVENOUS | Status: AC
  Administered 2020-04-04: 1000 mL via INTRAVENOUS

## 2020-04-04 MED ORDER — IOHEXOL 300 MG/ML  SOLN
100.0000 mL | Freq: Once | INTRAMUSCULAR | Status: AC | PRN
  Administered 2020-04-04: 100 mL via INTRAVENOUS

## 2020-04-04 MED ORDER — PANTOPRAZOLE SODIUM 40 MG PO TBEC
40.0000 mg | DELAYED_RELEASE_TABLET | Freq: Once | ORAL | Status: AC
  Administered 2020-04-04: 40 mg via ORAL
  Filled 2020-04-04: qty 1

## 2020-04-04 MED ORDER — CARVEDILOL 3.125 MG PO TABS
25.0000 mg | ORAL_TABLET | Freq: Once | ORAL | Status: AC
  Administered 2020-04-04: 25 mg via ORAL
  Filled 2020-04-04: qty 8

## 2020-04-04 NOTE — ED Notes (Signed)
MD aware of pt BP. No neuro deficits noted.

## 2020-04-04 NOTE — ED Triage Notes (Signed)
Pt has multiple complaints: Pt reports lower back pain, abd pain, hypertension and hyperglycemia. Pt states he does have a PCP but he hasn't been in a while. Pt reports compliance with all medications. Pt a.o, nad noted

## 2020-04-04 NOTE — ED Provider Notes (Signed)
[place] EMERGENCY DEPARTMENT Provider Note   CSN: [id number] Arrival date & time: 04/04/20  0759     History Chief Complaint  Patient presents with  . Back Pain  . Abdominal Pain  . Hyperglycemia    Randy [name]. is a 39 y.o. male.  HPI 39 year old male presents with abdominal pain.  It is lower abdomen.  He states it feels like pancreatitis which she has been having on and off for 2 years.  States it is usually worse in the winter months.  This particular episode has been ongoing for about a month.  He was seen here on 11/10 and given hydrocodone which he states both helped but also did not help.  No fevers or bowel changes.  He vomited this morning. No urinary symptoms.  He reports compliance with his meds though he did not take them this morning.  Is also having low back pain during the same time. No weakness in the legs.   Past Medical History:  Diagnosis Date  . HLD (hyperlipidemia)   . Hypertension   . Pancreatitis   . Type II diabetes mellitus [facility])     Patient Active Problem List   Diagnosis Date Noted  . Hyperlipidemia 06/01/2019  . Microalbuminuria due to type 2 diabetes mellitus ([place]) 06/01/2019  . Essential hypertension 05/31/2019  . Uncontrolled diabetes mellitus ([place]) 08/31/2015  . Marijuana abuse 08/31/2015    Past Surgical History:  Procedure Laterality Date  . NO PAST SURGERIES         Family History  Problem Relation Age of Onset  . Hypertension Mother   . Diabetes Father   . Hypertension Father   . Diabetes Brother   . Diabetes Maternal Grandmother   . Colon cancer Neg Hx   . Esophageal cancer Neg Hx   . Rectal cancer Neg Hx   . Stomach cancer Neg Hx     Social History   Tobacco Use  . Smoking status: Former Smoker    Packs/day: 0.33    Years: 17.00    Pack years: 5.61    Types: Cigarettes    Quit date: 12/26/2011    Years since quitting: 8.2  . Smokeless tobacco: Never Used  Vaping Use  . Vaping Use:  Never used  Substance Use Topics  . Alcohol use: No    Comment: none in6-7 years  . Drug use: Yes    Types: Marijuana    Comment: Nov 2020    Home Medications Prior to Admission medications   Medication Sig Start Date End Date Taking? Authorizing Provider  atorvastatin (LIPITOR) 80 MG tablet Take 1 tablet (80 mg total) by mouth daily. 07/28/19  Yes Gildardo Pounds, NP  carvedilol (COREG) 25 MG tablet Take 1 tablet (25 mg total) by mouth 2 (two) times daily with a meal. 03/22/20  Yes Hayden Rasmussen, MD  cholecalciferol (VITAMIN D3) 25 MCG (1000 UNIT) tablet Take 1,000 Units by mouth daily.   Yes [provider]  glipiZIDE (GLUCOTROL) 5 MG tablet Take 1 tablet (5 mg total) by mouth 2 (two) times daily before a meal. 03/22/20  Yes Hayden Rasmussen, MD  HYDROcodone-acetaminophen (NORCO/VICODIN) 5-325 MG tablet Take 1-2 tablets by mouth every 6 (six) hours as needed. 03/22/20  Yes Hayden Rasmussen, MD  insulin glargine (LANTUS) 100 UNIT/ML Solostar Pen Inject 22 Units into the skin daily. Patient taking differently: Inject 24 Units into the skin daily.  09/01/19 04/04/20 Yes Nicolette Bang,  DO  lisinopril (ZESTRIL) 40 MG tablet Take 1 tablet (40 mg total) by mouth daily. 03/22/20  Yes Hayden Rasmussen, MD  omeprazole (PRILOSEC) 40 MG capsule Take 1 capsule (40 mg total) by mouth daily. 10/07/19  Yes Nandigam, Venia Minks, MD  ondansetron (ZOFRAN ODT) 4 MG disintegrating tablet Take 1 tablet (4 mg total) by mouth every 8 (eight) hours as needed for nausea or vomiting. 03/22/20  Yes Hayden Rasmussen, MD  sildenafil (VIAGRA) 100 MG tablet Take 0.5-1 tablets (50-100 mg total) by mouth daily as needed for erectile dysfunction. 11/30/19  Yes Nicolette Bang, DO  Blood Glucose Monitoring Suppl (TRUE METRIX METER) w/Device KIT Use to check blood sugar TID. E11.65 Patient taking differently: 1 each by Other route in the morning, at noon, and at bedtime. Use to check blood  sugar TID. E11.65 06/10/19   Charlott Rakes, MD  glucose blood test strip Use as instructed to check TID. E11.65 Patient taking differently: 1 each by Other route in the morning, at noon, and at bedtime. Use as instructed to check TID. E11.65 06/10/19   Charlott Rakes, MD  Insulin Pen Needle (TRUEPLUS 5-BEVEL PEN NEEDLES) 32G X 4 MM MISC Use to inject insulin daily. Patient taking differently: 1 each by Other route daily. Use to inject insulin daily. 06/10/19   Charlott Rakes, MD  TRUEplus Lancets 28G MISC Use to check blood sugar three times daily. E11.65 Patient taking differently: 1 each by Other route in the morning, at noon, and at bedtime. Use to check blood sugar three times daily. E11.65 06/10/19   Charlott Rakes, MD    Allergies    Patient has no known allergies.  Review of Systems   Review of Systems  Constitutional: Negative for fever.  Cardiovascular: Negative for chest pain.  Gastrointestinal: Positive for abdominal pain and vomiting. Negative for diarrhea.  Genitourinary: Negative for dysuria.  Musculoskeletal: Positive for back pain.  All other systems reviewed and are negative.   Physical Exam Updated Vital Signs BP (!) 200/99 (BP Location: Left Arm)   Pulse 70   Temp 97.7 F (36.5 C) (Oral)   Resp 13   Ht '5\' 8"'  (1.727 m)   Wt 81.6 kg   SpO2 98%   BMI 27.37 kg/m   Physical Exam Vitals and nursing note reviewed.  Constitutional:      Appearance: He is well-developed.  HENT:     Head: Normocephalic and atraumatic.     Right Ear: External ear normal.     Left Ear: External ear normal.     Nose: Nose normal.  Eyes:     General:        Right eye: No discharge.        Left eye: No discharge.  Cardiovascular:     Rate and Rhythm: Normal rate and regular rhythm.     Heart sounds: Normal heart sounds.  Pulmonary:     Effort: Pulmonary effort is normal.     Breath sounds: Normal breath sounds.  Abdominal:     Palpations: Abdomen is soft.     Tenderness:  There is abdominal tenderness (mild) in the right lower quadrant.  Musculoskeletal:     Cervical back: Neck supple. No tenderness.     Thoracic back: No tenderness.     Lumbar back: No tenderness.  Skin:    General: Skin is warm and dry.  Neurological:     Mental Status: He is alert.     Comments: 5/5 strength in  BLE  Psychiatric:        Mood and Affect: Mood is not anxious.     ED Results / Procedures / Treatments   Labs (all labs ordered are listed, but only abnormal results are displayed) Labs Reviewed  LIPASE, BLOOD - Abnormal; Notable for the following components:      Result Value   Lipase 56 (*)    All other components within normal limits  COMPREHENSIVE METABOLIC PANEL - Abnormal; Notable for the following components:   Sodium 131 (*)    Glucose, Bld 410 (*)    Creatinine, Ser 1.44 (*)    Calcium 8.7 (*)    Albumin 3.3 (*)    All other components within normal limits  CBC - Abnormal; Notable for the following components:   RBC 4.20 (*)    Hemoglobin 11.2 (*)    HCT 35.7 (*)    All other components within normal limits  URINALYSIS, ROUTINE W REFLEX MICROSCOPIC - Abnormal; Notable for the following components:   Color, Urine STRAW (*)    Glucose, UA >=500 (*)    Hgb urine dipstick MODERATE (*)    Protein, ur 100 (*)    Bacteria, UA RARE (*)    All other components within normal limits  CBG MONITORING, ED - Abnormal; Notable for the following components:   Glucose-Capillary 382 (*)    All other components within normal limits  CBG MONITORING, ED - Abnormal; Notable for the following components:   Glucose-Capillary 289 (*)    All other components within normal limits    EKG None  Radiology CT ABDOMEN PELVIS W CONTRAST  Result Date: 04/04/2020 CLINICAL DATA:  Right lower quadrant abdominal pain. Suspected appendicitis. EXAM: CT ABDOMEN AND PELVIS WITH CONTRAST TECHNIQUE: Multidetector CT imaging of the abdomen and pelvis was performed using the standard  protocol following bolus administration of intravenous contrast. CONTRAST:  124m OMNIPAQUE IOHEXOL 300 MG/ML  SOLN COMPARISON:  10/06/2019 FINDINGS: Lower chest: Probable small hiatal hernia. Lung bases are clear. No pleural fluid. Hepatobiliary: Normal Pancreas: Normal Spleen: Normal Adrenals/Urinary Tract: Adrenal glands are normal. Kidneys are normal. No cyst, mass, stone or hydronephrosis. Bladder is normal. Stomach/Bowel: Other than the small hiatal hernia. The stomach appears normal. No small bowel pathology is evident. Normal appendix. Normal amount of fecal matter in the colon. No acute colon pathology. Vascular/Lymphatic: Small vessel calcification and calcification of the vas deferens consistent with diabetes. No adenopathy. Reproductive: Normal Other: No free fluid or air. Musculoskeletal: Normal IMPRESSION: 1. Normal appearing appendix. No evidence of urinary tract pathology. No abnormality seen to explain right lower quadrant pain. 2. Small hiatal hernia. 3. Small vessel calcification and calcification of the vas deferens consistent with diabetes. Electronically Signed   By: MNelson ChimesM.D.   On: 04/04/2020 10:43    Procedures Procedures (including critical care time)  Medications Ordered in ED Medications  carvedilol (COREG) tablet 25 mg (25 mg Oral Given 04/04/20 0845)  lisinopril (ZESTRIL) tablet 40 mg (40 mg Oral Given 04/04/20 0845)  pantoprazole (PROTONIX) EC tablet 40 mg (40 mg Oral Given 04/04/20 0845)  morphine 4 MG/ML injection 4 mg (4 mg Intravenous Given 04/04/20 0844)  sodium chloride 0.9 % bolus 1,000 mL (0 mLs Intravenous Stopped 04/04/20 1017)  sodium chloride 0.9 % bolus 1,000 mL (1,000 mLs Intravenous New Bag/Given 04/04/20 1012)  iohexol (OMNIPAQUE) 300 MG/ML solution 100 mL (100 mLs Intravenous Contrast Given 04/04/20 1013)    ED Course  I have reviewed the triage vital  signs and the nursing notes.  Pertinent labs & imaging results that were available during my  care of the patient were reviewed by me and considered in my medical decision making (see chart for details).    MDM Rules/Calculators/A&P                          Given patient's right lower quadrant tenderness and prolonged symptoms, CT was obtained.  This is benign.  I personally reviewed these images.  Otherwise, labs show hyperglycemia which is improving with IV fluids.  No acidosis/DKA.  Patient is hypertensive and I question if he is truly taking his meds.  He was given his morning meds here for blood pressure.  Otherwise I stressed the importance of following up with a PCP.  He is asking for pain medicines upon discharge but I see no indication of this.  I wonder if he is having chronic pain given the length of time of his symptoms. Final Clinical Impression(s) / ED Diagnoses Final diagnoses:  Lower abdominal pain  Hyperglycemia  Hypertension, unspecified type    Rx / DC Orders ED Discharge Orders    None       Sherwood Gambler, MD 04/04/20 1125

## 2020-04-04 NOTE — ED Notes (Signed)
Patient Alert and oriented to baseline. Stable and ambulatory to baseline. Patient verbalized understanding of the discharge instructions.  Patient belongings were taken by the patient.   

## 2020-04-08 ENCOUNTER — Encounter (HOSPITAL_COMMUNITY): Payer: Self-pay | Admitting: *Deleted

## 2020-04-08 ENCOUNTER — Emergency Department (HOSPITAL_COMMUNITY): Payer: Self-pay

## 2020-04-08 ENCOUNTER — Other Ambulatory Visit: Payer: Self-pay

## 2020-04-08 ENCOUNTER — Inpatient Hospital Stay (HOSPITAL_COMMUNITY)
Admission: EM | Admit: 2020-04-08 | Discharge: 2020-04-11 | DRG: 439 | Disposition: A | Discharge status: Home / Self Care | Payer: Self-pay | Attending: Family Medicine | Admitting: Family Medicine

## 2020-04-08 DIAGNOSIS — E785 Hyperlipidemia, unspecified: Secondary | ICD-10-CM | POA: Diagnosis present

## 2020-04-08 DIAGNOSIS — I161 Hypertensive emergency: Secondary | ICD-10-CM | POA: Diagnosis present

## 2020-04-08 DIAGNOSIS — Z794 Long term (current) use of insulin: Secondary | ICD-10-CM

## 2020-04-08 DIAGNOSIS — Z87891 Personal history of nicotine dependence: Secondary | ICD-10-CM

## 2020-04-08 DIAGNOSIS — I160 Hypertensive urgency: Secondary | ICD-10-CM

## 2020-04-08 DIAGNOSIS — Z20822 Contact with and (suspected) exposure to covid-19: Secondary | ICD-10-CM | POA: Diagnosis present

## 2020-04-08 DIAGNOSIS — I10 Essential (primary) hypertension: Secondary | ICD-10-CM | POA: Diagnosis present

## 2020-04-08 DIAGNOSIS — E119 Type 2 diabetes mellitus without complications: Secondary | ICD-10-CM | POA: Diagnosis present

## 2020-04-08 DIAGNOSIS — K859 Acute pancreatitis without necrosis or infection, unspecified: Principal | ICD-10-CM | POA: Diagnosis present

## 2020-04-08 DIAGNOSIS — Z833 Family history of diabetes mellitus: Secondary | ICD-10-CM

## 2020-04-08 DIAGNOSIS — Z7984 Long term (current) use of oral hypoglycemic drugs: Secondary | ICD-10-CM

## 2020-04-08 DIAGNOSIS — Z8249 Family history of ischemic heart disease and other diseases of the circulatory system: Secondary | ICD-10-CM

## 2020-04-08 DIAGNOSIS — Z79899 Other long term (current) drug therapy: Secondary | ICD-10-CM

## 2020-04-08 LAB — TROPONIN I (HIGH SENSITIVITY)
Troponin I (High Sensitivity): 15 ng/L (ref <18)
Troponin I (High Sensitivity): 15 ng/L (ref <18)

## 2020-04-08 LAB — COMPREHENSIVE METABOLIC PANEL
ALT: 18 U/L (ref 0–44)
AST: 28 U/L (ref 15–41)
Albumin: 3.3 g/dL — ABNORMAL LOW (ref 3.5–5.0)
Alkaline Phosphatase: 79 U/L (ref 38–126)
Anion gap: 9 (ref 5–15)
BUN: 11 mg/dL (ref 6–20)
CO2: 26 mmol/L (ref 22–32)
Calcium: 9.2 mg/dL (ref 8.9–10.3)
Chloride: 100 mmol/L (ref 98–111)
Creatinine, Ser: 1.26 mg/dL — ABNORMAL HIGH (ref 0.61–1.24)
GFR, Estimated: >60 mL/min (ref >60)
Glucose, Bld: 158 mg/dL — ABNORMAL HIGH (ref 70–99)
Potassium: 4.1 mmol/L (ref 3.5–5.1)
Sodium: 135 mmol/L (ref 135–145)
Total Bilirubin: 1 mg/dL (ref 0.3–1.2)
Total Protein: 7.4 g/dL (ref 6.5–8.1)

## 2020-04-08 LAB — CBC
HCT: 34.2 % — ABNORMAL LOW (ref 39.0–52.0)
Hemoglobin: 11 g/dL — ABNORMAL LOW (ref 13.0–17.0)
MCH: 27.2 pg (ref 26.0–34.0)
MCHC: 32.2 g/dL (ref 30.0–36.0)
MCV: 84.4 fL (ref 80.0–100.0)
Platelets: 317 10*3/uL (ref 150–400)
RBC: 4.05 MIL/uL — ABNORMAL LOW (ref 4.22–5.81)
RDW: 11.9 % (ref 11.5–15.5)
WBC: 7.5 10*3/uL (ref 4.0–10.5)
nRBC: 0 % (ref 0.0–0.2)

## 2020-04-08 LAB — URINALYSIS, COMPLETE (UACMP) WITH MICROSCOPIC
Bacteria, UA: NONE SEEN
Bilirubin Urine: NEGATIVE
Glucose, UA: 50 mg/dL — AB
Ketones, ur: NEGATIVE mg/dL
Leukocytes,Ua: NEGATIVE
Nitrite: NEGATIVE
Protein, ur: >=300 mg/dL — AB
Specific Gravity, Urine: 1.01 (ref 1.005–1.030)
pH: 7 (ref 5.0–8.0)

## 2020-04-08 LAB — HEMOGLOBIN A1C
Hgb A1c MFr Bld: 11.4 % — ABNORMAL HIGH (ref 4.8–5.6)
Mean Plasma Glucose: 280.48 mg/dL

## 2020-04-08 LAB — LIPASE, BLOOD: Lipase: 200 U/L — ABNORMAL HIGH (ref 11–51)

## 2020-04-08 MED ORDER — LABETALOL HCL 5 MG/ML IV SOLN: 10.0000 mg | Freq: Once | INTRAVENOUS | Status: DC

## 2020-04-08 MED ORDER — ENOXAPARIN SODIUM 30 MG/0.3ML ~~LOC~~ SOLN
30.0000 mg | SUBCUTANEOUS | Status: DC
  Administered 2020-04-09: 30 mg via SUBCUTANEOUS
  Filled 2020-04-08: qty 0.3

## 2020-04-08 MED ORDER — IOHEXOL 350 MG/ML SOLN
100.0000 mL | Freq: Once | INTRAVENOUS | Status: AC | PRN
  Administered 2020-04-08: 100 mL via INTRAVENOUS

## 2020-04-08 MED ORDER — CARVEDILOL 25 MG PO TABS
25.0000 mg | ORAL_TABLET | Freq: Two times a day (BID) | ORAL | Status: DC
  Administered 2020-04-08 – 2020-04-11 (×6): 25 mg via ORAL
  Filled 2020-04-08 (×5): qty 1
  Filled 2020-04-08: qty 8

## 2020-04-08 MED ORDER — LABETALOL HCL 5 MG/ML IV SOLN
20.0000 mg | Freq: Once | INTRAVENOUS | Status: AC
  Administered 2020-04-08: 20 mg via INTRAVENOUS
  Filled 2020-04-08: qty 4

## 2020-04-08 MED ORDER — ONDANSETRON HCL 4 MG/2ML IJ SOLN
4.0000 mg | Freq: Once | INTRAMUSCULAR | Status: AC
  Administered 2020-04-08: 4 mg via INTRAVENOUS
  Filled 2020-04-08: qty 2

## 2020-04-08 MED ORDER — ACETAMINOPHEN 325 MG PO TABS
650.0000 mg | ORAL_TABLET | Freq: Four times a day (QID) | ORAL | Status: DC | PRN
  Administered 2020-04-11: 650 mg via ORAL
  Filled 2020-04-08: qty 2

## 2020-04-08 MED ORDER — LISINOPRIL 40 MG PO TABS
40.0000 mg | ORAL_TABLET | Freq: Every day | ORAL | Status: DC
  Administered 2020-04-09 – 2020-04-11 (×3): 40 mg via ORAL
  Filled 2020-04-08 (×3): qty 1

## 2020-04-08 MED ORDER — FENTANYL CITRATE (PF) 100 MCG/2ML IJ SOLN
50.0000 ug | Freq: Once | INTRAMUSCULAR | Status: AC
  Administered 2020-04-08: 50 ug via INTRAVENOUS
  Filled 2020-04-08: qty 2

## 2020-04-08 MED ORDER — MORPHINE SULFATE (PF) 4 MG/ML IV SOLN
4.0000 mg | Freq: Once | INTRAVENOUS | Status: AC
  Administered 2020-04-08: 4 mg via INTRAVENOUS
  Filled 2020-04-08: qty 1

## 2020-04-08 MED ORDER — INSULIN ASPART 100 UNIT/ML ~~LOC~~ SOLN
0.0000 [IU] | SUBCUTANEOUS | Status: DC
  Administered 2020-04-09: 2 [IU] via SUBCUTANEOUS

## 2020-04-08 MED ORDER — ACETAMINOPHEN 650 MG RE SUPP: 650.0000 mg | Freq: Four times a day (QID) | RECTAL | Status: DC | PRN

## 2020-04-08 MED ORDER — LACTATED RINGERS IV BOLUS
1000.0000 mL | Freq: Once | INTRAVENOUS | Status: AC
  Administered 2020-04-08: 1000 mL via INTRAVENOUS

## 2020-04-08 MED ORDER — PANTOPRAZOLE SODIUM 40 MG IV SOLR
40.0000 mg | Freq: Once | INTRAVENOUS | Status: AC
  Administered 2020-04-08: 40 mg via INTRAVENOUS
  Filled 2020-04-08: qty 40

## 2020-04-08 NOTE — ED Notes (Signed)
Lab to run [facility] and lipid panel on specimens in lab.

## 2020-04-08 NOTE — H&P (Signed)
[facility] Admission History and Physical Service Pager: [phone]  Patient name: Randy [name]. Medical record number: [id number] Date of birth: 12-29-1980 Age: 39 y.o. Gender: male  Primary Care Provider: Nicolette Bang, DO Consultants: none Code Status: Full Preferred Emergency Contact: wife Gejetta  Chief Complaint: nausea, vomiting, and abdominal/back pain  Assessment and Plan: [name]. is a 39 y.o. male presenting with nausea, vomiting, abdominal/back pain and elevated BP. PMH is significant for  DMT2, HLD, H TN.  Hypertensive Crisis 39 yo patient presents with increasing back pain and SBP as high as 265 in ED. CXR did not show widened mediastinum, CTA dissection study showed no aortic changes. Patient has h/o HTN and treated with lisinopril 40 mg, coreg 25 mg BID at home. Patient reports that he is compliant with medications. He received Labetalol 20mg  IV with improvement in SBP to 144/73 most recently. Overall, patient reports that other than pain, he was asymptomatic: no headache, no change in vision, no problems with speech or movement. Potentially some aspect of pain involved in BP as patient also received fentanyl 50 mcg IV and morphine 4mg  IV for pain (see below), but with resolution of BP with only 20 mg of labetalol given at 1800, suspect element of noncompliance.  - admit to med-surg, FPTS, attending Dr. Ardelia Mems - If BP >200/100, can consider IV medication - Will restart home medications tomorrow when can take medications with sips: coreg 25 mg BID, lisinopril 40 mg - NPO tonight - Lovenox for dvt ppx - Up with assistance  Pancreatitis Lipase elevated to 200 and CTA shows mild pancreatitis. Patient reports remote h/o pancreatitis, not diagnosed at [facility] per chart review. He has had intermittent episodes of abdominal pain over the last several years. He had a RUQ [place] in May 2021 that did not show any gall stones. He  subsequently underwent an EGD with Dr. Silverio Decamp, at that time found mild dilation of CBD, elevated alk phos, no gall stones, recommended MRCP to exclude CBD sludge or stones. Does not appear that patient had MRCP. Patient says he has not had any EtOH in 7 years, blood EtOH level ordered. Awaiting lipid panel. For now, being treated with IVF, antiemetics prn, pain medication prn. Plan to advance to sips/CLD tomorrow. Etiology for pancreatitis still unknown, possibly idiopathic, but could be gallstones in CBD. Can consider RUQ [place], especially if no improvement. Patient received fentanyl 50 mcg IV, morphine 4mg  IV, and 1L LR bolus. See pain management below. - NPO  - LR at mIVF - zofran 4mg  IV q6h prn - morphine 4mg  IV q4h prn - f/u lipid panel, etoh  - AM BMP  DMT2 Patient has history of uncontrolled DM, A1c 11.3% today. Home medications include lantus 24U daily, glipizide 5 mg. Patient has history of abdominal pain and GI complaints, suspect this may be why he is not on metformin. BG appropriate at 158 today.  Patient is presently NPO, will monitor BG, will hopefully be able to advance to CLD tomorrow, for now will have SSI available. - CBG q4h - sens SSI  Microalbuminuria Patient has known protein in urine, elevated to 300 on UA today, but this has been present multiple times in the last year. He is on ACEi tx, lisinopril 40 mg. Cr baseline appears to be about 0.9-1.1, mildly elevated to 1.26 today. Recommend considering SGLT2i when pancreatitis improved in order to preserve kidney function and drive down U2V. - continue lisinopril  HLD Chronic,  stable. Home medication atorvastatin 80 mg. - continue home med when able to take pills by mouth  FEN/GI: Diabetic diet Prophylaxis: lovenox  Disposition: to med-surg pending improvement in BP and pancreatitis  History of Present Illness:  Randy [name]. is a 39 y.o. male presenting with back pain that radiates to the abdomen and has been  increasing for the last 2 weeks, but worsened in the last day. He has no trauma, no lifting. Patient is being treated for H TN, reports that he has been compliant with all medications. Patient has a history of pancreatitis   Review Of Systems: Per HPI with the following additions:   Review of Systems  Constitutional: Negative for activity change and fever.  HENT: Negative for rhinorrhea, sinus pain and sore throat.   Respiratory: Negative for cough, chest tightness and shortness of breath.   Cardiovascular: Negative for chest pain and leg swelling.  Gastrointestinal: Positive for abdominal pain, nausea and vomiting.  Genitourinary: Negative for dysuria.  Musculoskeletal: Positive for back pain.  All other systems reviewed and are negative.    Patient Active Problem List   Diagnosis Date Noted  . Hyperlipidemia 06/01/2019  . Microalbuminuria due to type 2 diabetes mellitus ([place]) 06/01/2019  . Essential hypertension 05/31/2019  . Uncontrolled diabetes mellitus ([place]) 08/31/2015  . Marijuana abuse 08/31/2015    Past Medical History: Past Medical History:  Diagnosis Date  . HLD (hyperlipidemia)   . Hypertension   . Pancreatitis   . Type II diabetes mellitus ([place])     Past Surgical History: Past Surgical History:  Procedure Laterality Date  . NO PAST SURGERIES      Social History: Social History   Tobacco Use  . Smoking status: Former Smoker    Packs/day: 0.33    Years: 17.00    Pack years: 5.61    Types: Cigarettes    Quit date: 12/26/2011    Years since quitting: 8.2  . Smokeless tobacco: Never Used  Vaping Use  . Vaping Use: Never used  Substance Use Topics  . Alcohol use: No    Comment: none in6-7 years  . Drug use: Yes    Types: Marijuana    Comment: Nov 2020   Additional social history:  Please also refer to relevant sections of EMR.  Family History: Family History  Problem Relation Age of Onset  . Hypertension Mother   . Diabetes Father   .  Hypertension Father   . Diabetes Brother   . Diabetes Maternal Grandmother   . Colon cancer Neg Hx   . Esophageal cancer Neg Hx   . Rectal cancer Neg Hx   . Stomach cancer Neg Hx     Allergies and Medications: No Known Allergies No current facility-administered medications on file prior to encounter.   Current Outpatient Medications on File Prior to Encounter  Medication Sig Dispense Refill  . atorvastatin (LIPITOR) 80 MG tablet Take 1 tablet (80 mg total) by mouth daily. 90 tablet 2  . Blood Glucose Monitoring Suppl (TRUE METRIX METER) w/Device KIT Use to check blood sugar TID. E11.65 (Patient taking differently: 1 each by Other route in the morning, at noon, and at bedtime. Use to check blood sugar TID. E11.65) 1 kit 0  . carvedilol (COREG) 25 MG tablet Take 1 tablet (25 mg total) by mouth 2 (two) times daily with a meal. 60 tablet 2  . glipiZIDE (GLUCOTROL) 5 MG tablet Take 1 tablet (5 mg total) by mouth 2 (two) times daily  before a meal. 60 tablet 2  . glucose blood test strip Use as instructed to check TID. E11.65 (Patient taking differently: 1 each by Other route in the morning, at noon, and at bedtime. Use as instructed to check TID. E11.65) 100 each 11  . HYDROcodone-acetaminophen (NORCO/VICODIN) 5-325 MG tablet Take 1-2 tablets by mouth every 6 (six) hours as needed. 10 tablet 0  . insulin glargine (LANTUS) 100 UNIT/ML Solostar Pen Inject 22 Units into the skin daily. (Patient taking differently: Inject 24 Units into the skin daily. ) 15 mL   . Insulin Pen Needle (TRUEPLUS 5-BEVEL PEN NEEDLES) 32G X 4 MM MISC Use to inject insulin daily. (Patient taking differently: 1 each by Other route daily. Use to inject insulin daily.) 100 each 11  . lisinopril (ZESTRIL) 40 MG tablet Take 1 tablet (40 mg total) by mouth daily. 30 tablet 2  . omeprazole (PRILOSEC) 40 MG capsule Take 1 capsule (40 mg total) by mouth daily. 90 capsule 3  . ondansetron (ZOFRAN ODT) 4 MG disintegrating tablet Take 1  tablet (4 mg total) by mouth every 8 (eight) hours as needed for nausea or vomiting. 20 tablet 0  . sildenafil (VIAGRA) 100 MG tablet Take 0.5-1 tablets (50-100 mg total) by mouth daily as needed for erectile dysfunction. 10 tablet 11  . TRUEplus Lancets 28G MISC Use to check blood sugar three times daily. E11.65 (Patient taking differently: 1 each by Other route in the morning, at noon, and at bedtime. Use to check blood sugar three times daily. E11.65) 100 each 11    Objective: BP (!) 181/110   Pulse 79   Temp 98.5 F (36.9 C)   Resp (!) 24   Ht _0  (1.727 m)   Wt 81.6 kg   SpO2 100%   BMI 27.35 kg/m  Exam: General: middle-aged AA man resting in bed comfortably, NAD Eyes: anicteric sclerae ENTM: NCAT, mmm Neck: supple Cardiovascular: RRR, no m/r/g Respiratory: CTAB, no increased WOB, no w/r/r Gastrointestinal: soft, mildly TTP in epigastric and RUQ, ND, normal bowel sounds present MSK: no TTP in central column, paraspinal muscles Derm: no lesions or rashes Neuro: AOx4, alert, at baseline Psych: appropriate, normal mood  Labs and Imaging: CBC BMET  Recent Labs  Lab 04/08/20 1703  WBC 7.5  HGB 11.0*  HCT 34.2*  PLT 317   Recent Labs  Lab 04/08/20 1703  NA 135  K 4.1  CL 100  CO2 26  BUN 11  CREATININE 1.26*  GLUCOSE 158*  CALCIUM 9.2     EKG: My own interpretation (not copied from electronic read) NSR with normal variant of early repolarization  DG Chest Portable 1 View  Result Date: 04/08/2020 CLINICAL DATA:  Epigastric pain EXAM: PORTABLE CHEST 1 VIEW COMPARISON:  06/08/2019 FINDINGS: The heart size and mediastinal contours are within normal limits. Costophrenic angles were not included within the field of view. No focal airspace consolidation, pleural effusion, or pneumothorax is identified. The visualized skeletal structures are unremarkable. IMPRESSION: No active disease. Electronically Signed   By: Davina Poke D.O.   On: 04/08/2020 17:56   CT  Angio Chest/Abd/Pel for Dissection W and/or Wo Contrast  Result Date: 04/08/2020 CLINICAL DATA:  Abdominal pain and back pain x2 weeks. EXAM: CT ANGIOGRAPHY CHEST, ABDOMEN AND PELVIS TECHNIQUE: Non-contrast CT of the chest was initially obtained. Multidetector CT imaging through the chest, abdomen and pelvis was performed using the standard protocol during bolus administration of intravenous contrast. Multiplanar reconstructed images and MIPs  were obtained and reviewed to evaluate the vascular anatomy. CONTRAST:  156m OMNIPAQUE IOHEXOL 350 MG/ML SOLN COMPARISON:  April 04, 2020 FINDINGS: CTA CHEST FINDINGS Cardiovascular: Satisfactory opacification of the pulmonary arteries to the segmental level. No evidence of pulmonary embolism. Normal heart size. No pericardial effusion. Mediastinum/Nodes: No enlarged mediastinal, hilar, or axillary lymph nodes. Thyroid gland, trachea, and esophagus demonstrate no significant findings. Lungs/Pleura: Lungs are clear. No pleural effusion or pneumothorax. Musculoskeletal: No chest wall abnormality. No acute or significant osseous findings. Review of the MIP images confirms the above findings. CTA ABDOMEN AND PELVIS FINDINGS VASCULAR Aorta: Normal caliber aorta without aneurysm, dissection, vasculitis or significant stenosis. Celiac: Patent without evidence of aneurysm, dissection, vasculitis or significant stenosis. SMA: Patent without evidence of aneurysm, dissection, vasculitis or significant stenosis. Renals: Both renal arteries are patent without evidence of aneurysm, dissection, vasculitis, fibromuscular dysplasia or significant stenosis. IMA: Patent without evidence of aneurysm, dissection, vasculitis or significant stenosis. Inflow: Patent without evidence of aneurysm, dissection, vasculitis or significant stenosis. Veins: No obvious venous abnormality within the limitations of this arterial phase study. Review of the MIP images confirms the above findings.  NON-VASCULAR Hepatobiliary: No focal liver abnormality is seen. No gallstones, gallbladder wall thickening, or biliary dilatation. Pancreas: A very mild amount of peripancreatic inflammatory fat stranding is seen at the junction of the pancreatic body and head. Spleen: Normal in size without focal abnormality. Adrenals/Urinary Tract: Adrenal glands are unremarkable. Kidneys are normal, without renal calculi, focal lesion, or hydronephrosis. Bladder is unremarkable. Stomach/Bowel: A small, stable hiatal hernia is seen. Appendix appears normal. No evidence of bowel wall thickening, distention, or inflammatory changes. Lymphatic: No abnormal abdominal or pelvic lymph nodes are identified. Reproductive: Prostate is unremarkable. Other: No abdominal wall hernia or abnormality. No abdominopelvic ascites. Musculoskeletal: No acute or significant osseous findings. Review of the MIP images confirms the above findings. IMPRESSION: 1. Findings suggestive of very mild acute pancreatitis. Correlation with pancreatic enzymes is recommended. 2. Small, stable hiatal hernia. Electronically Signed   By: TVirgina NorfolkM.D.   On: 04/08/2020 20:27     MGladys Damme MD 04/08/2020, 9:36 PM PGY-2, [place] pager: [phone] text pages welcome

## 2020-04-08 NOTE — ED Notes (Signed)
Pt vomiting on arrival to the ed

## 2020-04-08 NOTE — ED Provider Notes (Signed)
[place] EMERGENCY DEPARTMENT Provider Note   CSN: [id number] Arrival date & time: 04/08/20  1650     History Chief Complaint  Patient presents with  . Abdominal Pain    Randy R Ziah [name]. is a 39 y.o. male with a history of pancreatitis, hypertension, hyperlipidemia, and diabetes presents with back pain for the past 2 weeks.  Patient states that has been intermittent but worsened yesterday night.  He states when it worsens, he has associated abdominal pain.  Aggravated by cold air, alleviated by warm water (like taking a bath).  The history is provided by the patient and the EMS personnel.  Back Pain Location:  Thoracic spine and lumbar spine Quality: like "pinpricks" but when it worsens it is a "squeezing" sensation. Radiates to: to abdomen. Pain severity:  Moderate Onset quality:  Gradual Duration: 2 weeks, worsened last night. Timing:  Constant Progression:  Worsening Chronicity:  Recurrent Context: not falling, not lifting heavy objects, not MVA, not physical stress, not recent injury and not twisting   Relieved by: Heat (hot water like taking a bath) Exacerbated by: Cold air. Associated symptoms: abdominal pain   Associated symptoms: no bladder incontinence, no bowel incontinence, no chest pain, no dysuria, no fever, no headaches, no leg pain, no numbness, no paresthesias, no perianal numbness, no tingling and no weakness        Past Medical History:  Diagnosis Date  . HLD (hyperlipidemia)   . Hypertension   . Pancreatitis   . Type II diabetes mellitus [facility])     Patient Active Problem List   Diagnosis Date Noted  . Hyperlipidemia 06/01/2019  . Microalbuminuria due to type 2 diabetes mellitus ([place]) 06/01/2019  . Essential hypertension 05/31/2019  . Uncontrolled diabetes mellitus ([place]) 08/31/2015  . Marijuana abuse 08/31/2015    Past Surgical History:  Procedure Laterality Date  . NO PAST SURGERIES         Family History  Problem  Relation Age of Onset  . Hypertension Mother   . Diabetes Father   . Hypertension Father   . Diabetes Brother   . Diabetes Maternal Grandmother   . Colon cancer Neg Hx   . Esophageal cancer Neg Hx   . Rectal cancer Neg Hx   . Stomach cancer Neg Hx     Social History   Tobacco Use  . Smoking status: Former Smoker    Packs/day: 0.33    Years: 17.00    Pack years: 5.61    Types: Cigarettes    Quit date: 12/26/2011    Years since quitting: 8.2  . Smokeless tobacco: Never Used  Vaping Use  . Vaping Use: Never used  Substance Use Topics  . Alcohol use: No    Comment: none in6-7 years  . Drug use: Yes    Types: Marijuana    Comment: Nov 2020    Home Medications Prior to Admission medications   Medication Sig Start Date End Date Taking? Authorizing Provider  atorvastatin (LIPITOR) 80 MG tablet Take 1 tablet (80 mg total) by mouth daily. 07/28/19   Gildardo Pounds, NP  Blood Glucose Monitoring Suppl (TRUE METRIX METER) w/Device KIT Use to check blood sugar TID. E11.65 Patient taking differently: 1 each by Other route in the morning, at noon, and at bedtime. Use to check blood sugar TID. E11.65 06/10/19   Charlott Rakes, MD  carvedilol (COREG) 25 MG tablet Take 1 tablet (25 mg total) by mouth 2 (two) times daily with a meal.  03/22/20   Hayden Rasmussen, MD  cholecalciferol (VITAMIN D3) 25 MCG (1000 UNIT) tablet Take 1,000 Units by mouth daily.    [provider]  glipiZIDE (GLUCOTROL) 5 MG tablet Take 1 tablet (5 mg total) by mouth 2 (two) times daily before a meal. 03/22/20   Hayden Rasmussen, MD  glucose blood test strip Use as instructed to check TID. E11.65 Patient taking differently: 1 each by Other route in the morning, at noon, and at bedtime. Use as instructed to check TID. E11.65 06/10/19   Charlott Rakes, MD  HYDROcodone-acetaminophen (NORCO/VICODIN) 5-325 MG tablet Take 1-2 tablets by mouth every 6 (six) hours as needed. 03/22/20   Hayden Rasmussen, MD   insulin glargine (LANTUS) 100 UNIT/ML Solostar Pen Inject 22 Units into the skin daily. Patient taking differently: Inject 24 Units into the skin daily.  09/01/19 04/04/20  Nicolette Bang, DO  Insulin Pen Needle (TRUEPLUS 5-BEVEL PEN NEEDLES) 32G X 4 MM MISC Use to inject insulin daily. Patient taking differently: 1 each by Other route daily. Use to inject insulin daily. 06/10/19   Charlott Rakes, MD  lisinopril (ZESTRIL) 40 MG tablet Take 1 tablet (40 mg total) by mouth daily. 03/22/20   Hayden Rasmussen, MD  omeprazole (PRILOSEC) 40 MG capsule Take 1 capsule (40 mg total) by mouth daily. 10/07/19   Mauri Pole, MD  ondansetron (ZOFRAN ODT) 4 MG disintegrating tablet Take 1 tablet (4 mg total) by mouth every 8 (eight) hours as needed for nausea or vomiting. 03/22/20   Hayden Rasmussen, MD  sildenafil (VIAGRA) 100 MG tablet Take 0.5-1 tablets (50-100 mg total) by mouth daily as needed for erectile dysfunction. 11/30/19   Nicolette Bang, DO  TRUEplus Lancets 28G MISC Use to check blood sugar three times daily. E11.65 Patient taking differently: 1 each by Other route in the morning, at noon, and at bedtime. Use to check blood sugar three times daily. E11.65 06/10/19   Charlott Rakes, MD    Allergies    Patient has no known allergies.  Review of Systems   Review of Systems  Constitutional: Negative for chills and fever.  HENT: Negative for ear pain and sore throat.   Eyes: Negative for pain and visual disturbance.  Respiratory: Negative for cough and shortness of breath.   Cardiovascular: Negative for chest pain, palpitations and leg swelling.  Gastrointestinal: Positive for abdominal pain. Negative for bowel incontinence and vomiting.  Genitourinary: Negative for bladder incontinence, dysuria and hematuria.  Musculoskeletal: Positive for back pain. Negative for arthralgias.  Skin: Negative for color change and rash.  Neurological: Negative for tingling,  seizures, syncope, weakness, numbness, headaches and paresthesias.    Physical Exam Updated Vital Signs Ht 5' 8" (1.727 m)   Wt 81.6 kg   BMI 27.35 kg/m   Physical Exam Vitals and nursing note reviewed.  Constitutional:      General: He is not in acute distress.    Appearance: He is well-developed. He is not ill-appearing or toxic-appearing.  HENT:     Head: Normocephalic and atraumatic.  Eyes:     Conjunctiva/sclera: Conjunctivae normal.  Cardiovascular:     Rate and Rhythm: Normal rate and regular rhythm.     Heart sounds: No murmur heard.   Pulmonary:     Effort: Pulmonary effort is normal. No respiratory distress.     Breath sounds: Normal breath sounds.  Abdominal:     General: There is no distension. There are no signs of  injury.     Palpations: Abdomen is soft.     Tenderness: There is abdominal tenderness (Generalized mild tenderness). There is no right CVA tenderness, left CVA tenderness, guarding or rebound.  Musculoskeletal:     Cervical back: Neck supple.     Comments: Mild lower T-spine/upper L-spine tenderness. No overlying skin changes, no paraspinal muscle tenderness.  Normal strength and sensation in BLE.  Skin:    General: Skin is warm and dry.  Neurological:     Mental Status: He is alert and oriented to person, place, and time.  Psychiatric:        Mood and Affect: Mood normal.        Behavior: Behavior normal.     ED Results / Procedures / Treatments   Labs (all labs ordered are listed, but only abnormal results are displayed) Labs Reviewed - No data to display  EKG None  Radiology No results found.  Procedures Procedures (including critical care time)  Medications Ordered in ED Medications - No data to display  ED Course  I have reviewed the triage vital signs and the nursing notes.  Pertinent labs & imaging results that were available during my care of the patient were reviewed by me and considered in my medical decision making  (see chart for details).    MDM Rules/Calculators/A&P                          MDM: Yuvin Bussiere is a 39 y.o. male who presens with back pain as per above. I have reviewed the nursing documentation for past medical history, family history, and social history. Pertinent previous records reviewed. He is awake, alert.  Hypertensive with a systolic of 967. Afebrile. Physical exam is most notable for overall well-appearing 39 year old male in no acute distress with mild T/L-spine tenderness but no step-offs or deformities.  Neurovascular intact distally in his lower extremities.  Mild diffuse abdominal pain.  Labs: Hemoglobin 11, lipase 200.  UA with bacteria but not infectious.  CBC and CMP otherwise unremarkable. EKG: NSR, nonspecific ST changes similar to prior. QTc, PR, and QRS within appropriate limits. No signs of acute ischemia, infarct, or significant electrical abnormalities. No STEMI, ST depressions, or significant T wave inversions. No evidence of a High-Grade Conduction Block, WPW, Brugada Sign, ARVC, DeWinters T Waves, or Wellens Waves. Imaging: CXR demonstrating no widened mediastinum, acute cardiopulmonary abnormality.  CTA dissection study with no dissection but with acute mild early pancreatitis. Consults: none Tx: Given 20 mg IV labetalol, 50 mcg IV fentanyl, 4 mg IV Zofran, 40 mg IV Protonix, 4 mg IV morphine  Differential Dx: I am most concerned for pancreatitis. Given history, physical exam, and work-up, I do not think he has aortic dissection or aneurysm, ACS, PE, bowel perforation, SBO, diverticulitis/diverticulosis, intra-abdominal hemorrhage, mesenteric ischemia, pyonephritis, cauda equina, epidural abscess, or trauma.  MDM: Sonda Rumble. is a 39 y.o. male with history of pancreatitis and hypertension presents with back pain that radiates to his abdomen.  Patient initially hypertensive with systolic to 591, given 20 mg IV labetalol with improvement to systolic of 638-466.   Given history of hypertension and abdominal/back pain, obtain CT dissection study.  Patient had a previous CT abdomen/pelvis but does not have recent dissection study.  CT demonstrating pancreatitis but no concerning aortic changes.  Given lipase of 200, diffuse abdominal exam, and CT with pancreatitis, will plan to admit for pancreatitis as well as hypertensive urgency.  Patient hypertensive in ED which improved with 20 mg IV labetalol.  Patient states he has been taking his medications outpatient and thus do not have a clear reason why he is so hypertensive.  Patient warrants admission for acute pancreatitis and hypertensive urgency.  Hospitalist consulted for admission, handoff given.  Admitted in stable condition.  Thought process, work-up, and plan for admission discussed with patient and wife at bedside who voiced understanding and agreed to this plan.  The plan for this patient was discussed with Dr. Johnney Killian, who voiced agreement and who oversaw evaluation and treatment of this patient.   Final Clinical Impression(s) / ED Diagnoses Final diagnoses:  None    Rx / DC Orders ED Discharge Orders    None       , , MD 04/09/20 Sheryle Hail, MD 04/21/20 2040

## 2020-04-08 NOTE — ED Notes (Signed)
Patient to CT via stretcher at this time.

## 2020-04-08 NOTE — ED Notes (Signed)
Pt  Asleep  His wife is at  The bedside

## 2020-04-08 NOTE — ED Notes (Signed)
Actively vomiting on arrival  cbg 195

## 2020-04-08 NOTE — ED Triage Notes (Signed)
The pt is c/o abd pain for 2 weeks with nausea and vomiting m he was seen here 4 days ago for the same but he reports nothing was found

## 2020-04-09 ENCOUNTER — Other Ambulatory Visit: Payer: Self-pay

## 2020-04-09 ENCOUNTER — Observation Stay (HOSPITAL_COMMUNITY): Payer: Self-pay

## 2020-04-09 DIAGNOSIS — I160 Hypertensive urgency: Secondary | ICD-10-CM

## 2020-04-09 DIAGNOSIS — Z794 Long term (current) use of insulin: Secondary | ICD-10-CM

## 2020-04-09 DIAGNOSIS — K859 Acute pancreatitis without necrosis or infection, unspecified: Principal | ICD-10-CM

## 2020-04-09 DIAGNOSIS — E119 Type 2 diabetes mellitus without complications: Secondary | ICD-10-CM

## 2020-04-09 LAB — CBC
HCT: 31 % — ABNORMAL LOW (ref 39.0–52.0)
Hemoglobin: 10.1 g/dL — ABNORMAL LOW (ref 13.0–17.0)
MCH: 27 pg (ref 26.0–34.0)
MCHC: 32.6 g/dL (ref 30.0–36.0)
MCV: 82.9 fL (ref 80.0–100.0)
Platelets: 263 10*3/uL (ref 150–400)
RBC: 3.74 MIL/uL — ABNORMAL LOW (ref 4.22–5.81)
RDW: 12.1 % (ref 11.5–15.5)
WBC: 6.6 10*3/uL (ref 4.0–10.5)
nRBC: 0 % (ref 0.0–0.2)

## 2020-04-09 LAB — BASIC METABOLIC PANEL
Anion gap: 11 (ref 5–15)
BUN: 9 mg/dL (ref 6–20)
CO2: 23 mmol/L (ref 22–32)
Calcium: 8.9 mg/dL (ref 8.9–10.3)
Chloride: 103 mmol/L (ref 98–111)
Creatinine, Ser: 1.18 mg/dL (ref 0.61–1.24)
GFR, Estimated: >60 mL/min (ref >60)
Glucose, Bld: 87 mg/dL (ref 70–99)
Potassium: 4 mmol/L (ref 3.5–5.1)
Sodium: 137 mmol/L (ref 135–145)

## 2020-04-09 LAB — GLUCOSE, CAPILLARY
Glucose-Capillary: 91 mg/dL (ref 70–99)
Glucose-Capillary: 71 mg/dL (ref 70–99)
Glucose-Capillary: 69 mg/dL — ABNORMAL LOW (ref 70–99)
Glucose-Capillary: 103 mg/dL — ABNORMAL HIGH (ref 70–99)
Glucose-Capillary: 164 mg/dL — ABNORMAL HIGH (ref 70–99)
Glucose-Capillary: 137 mg/dL — ABNORMAL HIGH (ref 70–99)
Glucose-Capillary: 168 mg/dL — ABNORMAL HIGH (ref 70–99)

## 2020-04-09 LAB — RESP PANEL BY RT-PCR (FLU A&B, COVID) ARPGX2
Influenza A by PCR: NEGATIVE
Influenza B by PCR: NEGATIVE
SARS Coronavirus 2 by RT PCR: NEGATIVE

## 2020-04-09 LAB — LIPID PANEL
Cholesterol: 352 mg/dL — ABNORMAL HIGH (ref 0–200)
HDL: 35 mg/dL — ABNORMAL LOW (ref >40)
LDL Cholesterol: 242 mg/dL — ABNORMAL HIGH (ref 0–99)
Total CHOL/HDL Ratio: 10.1 RATIO
Triglycerides: 377 mg/dL — ABNORMAL HIGH (ref <150)
VLDL: 75 mg/dL — ABNORMAL HIGH (ref 0–40)

## 2020-04-09 LAB — ETHANOL: Alcohol, Ethyl (B): <10 mg/dL (ref <10)

## 2020-04-09 MED ORDER — LABETALOL HCL 5 MG/ML IV SOLN
10.0000 mg | Freq: Once | INTRAVENOUS | Status: AC
  Administered 2020-04-09: 10 mg via INTRAVENOUS
  Filled 2020-04-09: qty 4

## 2020-04-09 MED ORDER — MORPHINE SULFATE (PF) 4 MG/ML IV SOLN
4.0000 mg | INTRAVENOUS | Status: DC | PRN
  Administered 2020-04-09 (×2): 4 mg via INTRAVENOUS
  Filled 2020-04-09 (×2): qty 1

## 2020-04-09 MED ORDER — MORPHINE SULFATE (PF) 4 MG/ML IV SOLN
4.0000 mg | INTRAVENOUS | Status: DC | PRN
  Administered 2020-04-09: 4 mg via INTRAVENOUS
  Filled 2020-04-09: qty 1

## 2020-04-09 MED ORDER — ENOXAPARIN SODIUM 40 MG/0.4ML ~~LOC~~ SOLN
40.0000 mg | SUBCUTANEOUS | Status: DC
  Administered 2020-04-10 (×2): 40 mg via SUBCUTANEOUS
  Filled 2020-04-09 (×2): qty 0.4

## 2020-04-09 MED ORDER — ONDANSETRON HCL 4 MG/2ML IJ SOLN: 4.0000 mg | Freq: Four times a day (QID) | INTRAMUSCULAR | Status: DC | PRN

## 2020-04-09 MED ORDER — OXYCODONE HCL 5 MG PO TABS
5.0000 mg | ORAL_TABLET | ORAL | Status: DC | PRN
  Administered 2020-04-09 – 2020-04-11 (×4): 5 mg via ORAL
  Filled 2020-04-09 (×4): qty 1

## 2020-04-09 MED ORDER — AMLODIPINE BESYLATE 10 MG PO TABS
10.0000 mg | ORAL_TABLET | Freq: Every day | ORAL | Status: DC
  Administered 2020-04-09 – 2020-04-11 (×3): 10 mg via ORAL
  Filled 2020-04-09 (×3): qty 1

## 2020-04-09 MED ORDER — LACTATED RINGERS IV SOLN: INTRAVENOUS | Status: DC

## 2020-04-09 MED ORDER — INSULIN ASPART 100 UNIT/ML ~~LOC~~ SOLN
0.0000 [IU] | Freq: Three times a day (TID) | SUBCUTANEOUS | Status: DC
  Administered 2020-04-10: 1 [IU] via SUBCUTANEOUS
  Administered 2020-04-10: 2 [IU] via SUBCUTANEOUS
  Administered 2020-04-10: 1 [IU] via SUBCUTANEOUS
  Administered 2020-04-11: 4 [IU] via SUBCUTANEOUS
  Administered 2020-04-11: 2 [IU] via SUBCUTANEOUS

## 2020-04-09 NOTE — Progress Notes (Signed)
BP 218/114.   Patient is asymptomatic. Patient denies headache, dizziness and no distress noted. MD made aware. Awaiting orders.  Will continue to monitor patient.

## 2020-04-09 NOTE — Progress Notes (Signed)
Called about patient having high blood pressures despite sleeping and appearing otherwise comfortable per RN. Patient s/p IV labetalol 10 mg at 1421.   1728 BP 205/106. Will redose IV labetalol 10 mg.   Melene Plan, M.D.  5:44 PM 04/09/2020

## 2020-04-09 NOTE — Progress Notes (Signed)
Family Medicine Teaching Service Daily Progress Note Intern Pager: [phone]  Patient name: Randy [name]. Medical record number: [id number] Date of birth: 01-11-1981 Age: 39 y.o. Gender: male  Primary Care Provider: Arvilla Market, DO Consultants: none Code Status: Full  Pt Overview and Major Events to Date:  11/27- admitted for pancreatitis, HTN crisis  Assessment and Plan: Randy [name]. is a 39 y.o. male presenting with nausea, vomiting, abdominal/back pain and elevated BP. PMH is significant for  DMT2, HLD, H TN  Hypertensive Crisis BP ranged from 120s/60s to 180s/100s overnight. Easily controlled yesterday with one dose of labetalol 20 mg IV. Will restart patient's home medications (coreg 25mg  BID, lisinopril 40 mg qd) today as diet advanced. Continue to monitor. - Start home medications - If BP persistently elevated >200/110s, can consider IV medication  Pancreatitis Lipid panel shows elevated triglycerides to 377, but not elevated enough for etiology for pancreatitis. EtOH <10. Will obtain RUQ to evaluate for gallstones. ADAT from NPO to CLD, can downgrade if unable to tolerate. Continue zofran for nausea, for pain can use tylenol, oxy 5 mg IR q4h prn, and morphine 4 mg q4h prn for break through. Patient should not receive oxy if no tylenol first, and no morphine if no oxy first. Continue IVF at slightly reduced rate to encourage PO hydration as able to tolerate. - ADAT - Pain: tylenol, oxy 5 mg q4h prn, morphine 4mg  q4hprn - zofran 4mg  IV q6h prn - LR at 75 mL/hr  DMT2 Patient has history of uncontrolled DM, A1c 11.3% on admission. Home medications include lantus 24U daily, glipizide 5 mg. Patient has history of abdominal pain and GI complaints, suspect this may be why he is not on metformin. BG ranged from 91 to 69 to 103 overnight. Will monitor sugar today while patient advanced to clears, can add D5 to fluids if needed. Consider GLP-1 and/or SGLT2i when  pancreatitis improved. - CBG q4h - sens SSI  Microalbuminuria Chronic, consistently high. Consider SGLT2i to prevent further kidney damage when pancreatitis resolves.  HLD Chronic, stable. Home medication atorvastatin 80 mg. - continue home med when able to take pills by mouth  FEN/GI: CLD PPx: lovenox   Status is: Observation  The patient remains OBS appropriate and will d/c before 2 midnights.  Dispo: The patient is from: Home              Anticipated d/c is to: Home              Anticipated d/c date is: 1 day              Patient currently is not medically stable to d/c.  Subjective:  Patient in [place] suite for RUQ for 2+ hours. Please see attending attestation.  Objective: Temp:  [97.6 F (36.4 C)-98.5 F (36.9 C)] 97.6 F (36.4 C) (11/28 0107) Pulse Rate:  [65-103] 71 (11/28 0107) Resp:  [8-24] 18 (11/28 0107) BP: (127-267)/(62-133) 144/73 (11/28 0030) SpO2:  [94 %-100 %] 100 % (11/28 0107) Weight:  [81.6 kg] 81.6 kg (11/27 1701) Physical Exam: Please see attending attestation  Laboratory: Recent Labs  Lab 04/04/20 0806 04/08/20 1703  WBC 6.3 7.5  HGB 11.2* 11.0*  HCT 35.7* 34.2*  PLT 283 317   Recent Labs  Lab 04/04/20 0806 04/08/20 1703  NA 131* 135  K 4.8 4.1  CL 99 100  CO2 23 26  BUN 19 11  CREATININE 1.44* 1.26*  CALCIUM 8.7* 9.2  PROT 7.1 7.4  BILITOT 0.4 1.0  ALKPHOS 95 79  ALT 15 18  AST 21 28  GLUCOSE 410* 158*    Imaging/Diagnostic Tests: DG Chest Portable 1 View  Result Date: 04/08/2020 CLINICAL DATA:  Epigastric pain EXAM: PORTABLE CHEST 1 VIEW COMPARISON:  06/08/2019 FINDINGS: The heart size and mediastinal contours are within normal limits. Costophrenic angles were not included within the field of view. No focal airspace consolidation, pleural effusion, or pneumothorax is identified. The visualized skeletal structures are unremarkable. IMPRESSION: No active disease. Electronically Signed   By: Duanne Guess D.O.   On:  04/08/2020 17:56   CT Angio Chest/Abd/Pel for Dissection W and/or Wo Contrast  Result Date: 04/08/2020 CLINICAL DATA:  Abdominal pain and back pain x2 weeks. EXAM: CT ANGIOGRAPHY CHEST, ABDOMEN AND PELVIS TECHNIQUE: Non-contrast CT of the chest was initially obtained. Multidetector CT imaging through the chest, abdomen and pelvis was performed using the standard protocol during bolus administration of intravenous contrast. Multiplanar reconstructed images and MIPs were obtained and reviewed to evaluate the vascular anatomy. CONTRAST:  OMNIPAQUE IOHEXOL 350 MG/ML SOLN COMPARISON:  April 04, 2020 FINDINGS: CTA CHEST FINDINGS Cardiovascular: Satisfactory opacification of the pulmonary arteries to the segmental level. No evidence of pulmonary embolism. Normal heart size. No pericardial effusion. Mediastinum/Nodes: No enlarged mediastinal, hilar, or axillary lymph nodes. Thyroid gland, trachea, and esophagus demonstrate no significant findings. Lungs/Pleura: Lungs are clear. No pleural effusion or pneumothorax. Musculoskeletal: No chest wall abnormality. No acute or significant osseous findings. Review of the MIP images confirms the above findings. CTA ABDOMEN AND PELVIS FINDINGS VASCULAR Aorta: Normal caliber aorta without aneurysm, dissection, vasculitis or significant stenosis. Celiac: Patent without evidence of aneurysm, dissection, vasculitis or significant stenosis. SMA: Patent without evidence of aneurysm, dissection, vasculitis or significant stenosis. Renals: Both renal arteries are patent without evidence of aneurysm, dissection, vasculitis, fibromuscular dysplasia or significant stenosis. IMA: Patent without evidence of aneurysm, dissection, vasculitis or significant stenosis. Inflow: Patent without evidence of aneurysm, dissection, vasculitis or significant stenosis. Veins: No obvious venous abnormality within the limitations of this arterial phase study. Review of the MIP images confirms the  above findings. NON-VASCULAR Hepatobiliary: No focal liver abnormality is seen. No gallstones, gallbladder wall thickening, or biliary dilatation. Pancreas: A very mild amount of peripancreatic inflammatory fat stranding is seen at the junction of the pancreatic body and head. Spleen: Normal in size without focal abnormality. Adrenals/Urinary Tract: Adrenal glands are unremarkable. Kidneys are normal, without renal calculi, focal lesion, or hydronephrosis. Bladder is unremarkable. Stomach/Bowel: A small, stable hiatal hernia is seen. Appendix appears normal. No evidence of bowel wall thickening, distention, or inflammatory changes. Lymphatic: No abnormal abdominal or pelvic lymph nodes are identified. Reproductive: Prostate is unremarkable. Other: No abdominal wall hernia or abnormality. No abdominopelvic ascites. Musculoskeletal: No acute or significant osseous findings. Review of the MIP images confirms the above findings. IMPRESSION: 1. Findings suggestive of very mild acute pancreatitis. Correlation with pancreatic enzymes is recommended. 2. Small, stable hiatal hernia. Electronically Signed   By: Aram Candela M.D.   On: 04/08/2020 20:27   Shirlean Mylar, MD 04/09/2020, 1:53 AM PGY-2, [facility] FPTS Intern pager: [phone], text pages welcome

## 2020-04-09 NOTE — Progress Notes (Signed)
1846  Recheck blood pressure 215/108  after IV labetalol 10mg . Patient in bed resting. No distress noted. Will inform oncoming nurse.

## 2020-04-09 NOTE — Progress Notes (Signed)
Patient with high Bp of 218/111, asymptomatic. On call MD suggested to continue to monitor as admitting was going to much until tomorrow when he resumes his antihypertensive. Patient stable, will continue to monitor.

## 2020-04-10 ENCOUNTER — Inpatient Hospital Stay (HOSPITAL_COMMUNITY): Payer: Self-pay

## 2020-04-10 LAB — GLUCOSE, CAPILLARY
Glucose-Capillary: 165 mg/dL — ABNORMAL HIGH (ref 70–99)
Glucose-Capillary: 190 mg/dL — ABNORMAL HIGH (ref 70–99)
Glucose-Capillary: 230 mg/dL — ABNORMAL HIGH (ref 70–99)
Glucose-Capillary: 195 mg/dL — ABNORMAL HIGH (ref 70–99)

## 2020-04-10 MED ORDER — GADOBUTROL 1 MMOL/ML IV SOLN
8.0000 mL | Freq: Once | INTRAVENOUS | Status: AC | PRN
  Administered 2020-04-10: 8 mL via INTRAVENOUS

## 2020-04-10 NOTE — Hospital Course (Addendum)
Randy [name].    Hypertensive Emergency Patient presented with increasing back pain and SBP as high as 265 in ED. CXR did not show widened mediastinum, CTA dissection study showed no aortic changes.  Patient continued on home CoReg and Lisinopril.  Received doses of IV Labetalol throughout stay.  Also started on Amlodipine 10 mg to be continued at discharge.  Patient BP better controlled in 130's-170's to time of discharged.  Given strict return precautions and strongly encouraged to follow-up with PCP and monitoring of BP at home.  Pancreatitis Lipase elevated to 200 and CTA shows mild pancreatitis. Started on IV LR fluids and morphine for pain and zofran for nausea.  Patient had previous MRCP scheduled outpatient   Continue IVF at slightly reduced rate to encourage PO hydration as able to tolerate.  Lipid panel shows elevated triglycerides to 377, but not elevated enough for etiology for pancreatitis. EtOH <10.  RUG normal.  MRCP obtained and was also found to be normal.  Pain largely resolved after 24 hours of fluids and patient had good PO tolerance.  Able to be discharged home safely.

## 2020-04-10 NOTE — Progress Notes (Signed)
Family Medicine Teaching Service Daily Progress Note Intern Pager: [phone]  Patient name: Randy [name]. Medical record number: [id number] Date of birth: 01-15-1981 Age: 39 y.o. Gender: male  Primary Care Provider: Arvilla Market, DO Consultants: None Code Status: Full  Pt Overview and Major Events to Date:  11/27- admitted for pancreatitis, HTN crisis   Assessment and Plan: Randy Stanleyis a 39 y.o.malepresenting with nausea, vomiting, abdominal/back pain and elevated BP. PMH is significant forDMT2, HLD, H TN   Hypertensive Crisis BP better controlled today. 140's-170's.  Patient's home medications (coreg 25mg  BID, lisinopril 40 mg qd) - Continue home meds - Add on Amlodipine 10 mg to patient's regimen  Pancreatitis Largely resolved.  Unknown cause. Patient tolerating food normally now.  Seen previously in clinic by [facility].  Previously had MRCP scheduled due to previous pancreatitis but was not obtained. - Curbside GI regarding potential MRCP - Continue carb controlled diet as tolerated  DMT2 Patient has history of uncontrolled DM, A1c 11.3% on admission.  Glucosese 130-160's last night. Home medications include lantus 24U daily, glipizide 5 mg.   - CBG q4h - sens SSI - D/C glipizide at diacharge  HLD Chronic, stable. Home medication atorvastatin 80 mg. - continue home med when able to take pills by mouth  FEN/GI: Carb controlled PPx: Lovenox   Status is: Inpatient  Remains inpatient appropriate because:Ongoing diagnostic testing needed not appropriate for outpatient work up   Dispo: The patient is from: Home              Anticipated d/c is to: Home              Anticipated d/c date is: 1 day              Patient currently is medically stable to d/c.        Subjective:  Patient indicates abdominal pain improved.  Has concerns about blood pressure continuing to be very high.    Objective: Temp:  [98.2 F (36.8 C)-98.5 F  (36.9 C)] 98.5 F (36.9 C) (11/29 1228) Pulse Rate:  [70-76] 70 (11/29 1228) Resp:  [16-20] 16 (11/29 1228) BP: (151-215)/(88-108) 151/88 (11/29 1228) SpO2:  [97 %-98 %] 98 % (11/29 1228) Physical Exam:  Physical Exam Constitutional:      General: He is not in acute distress.    Appearance: He is not ill-appearing.  HENT:     Head: Normocephalic and atraumatic.  Cardiovascular:     Rate and Rhythm: Normal rate and regular rhythm.  Pulmonary:     Effort: Pulmonary effort is normal.     Breath sounds: Normal breath sounds.  Abdominal:     General: Abdomen is flat. Bowel sounds are normal.     Palpations: Abdomen is soft.     Tenderness: There is abdominal tenderness in the suprapubic area.     Hernia: No hernia is present.  Skin:    General: Skin is warm.  Neurological:     Mental Status: He is alert.     Laboratory: Recent Labs  Lab 04/04/20 0806 04/08/20 1703 04/09/20 0331  WBC 6.3 7.5 6.6  HGB 11.2* 11.0* 10.1*  HCT 35.7* 34.2* 31.0*  PLT 283 317 263   Recent Labs  Lab 04/04/20 0806 04/08/20 1703 04/09/20 0331  NA 131* 135 137  K 4.8 4.1 4.0  CL 99 100 103  CO2 23 26 23   BUN 19 11 9   CREATININE 1.44* 1.26* 1.18  CALCIUM 8.7* 9.2  8.9  PROT 7.1 7.4  --   BILITOT 0.4 1.0  --   ALKPHOS 95 79  --   ALT 15 18  --   AST 21 28  --   GLUCOSE 410* 158* 87    Imaging/Diagnostic Tests:  RUQ Ultrasound  IMPRESSION: 1. No evidence of acute cholecystitis. 2. Increased caliber of the common bile duct measures 8.2 mm. On 10/01/2019 this measured 7 mm.  Jovita Kussmaul, MD 04/10/2020, 6:33 PM PGY-1, [facility] FPTS Intern pager: [phone], text pages welcome

## 2020-04-10 NOTE — Progress Notes (Signed)
Family Medicine Teaching Service Attending Brief Progress Note  S: Patient seen and examined this morning around 9:30am (I saw patient on behalf of Dr. Miquel Dunn). Patient reports his abdomen is improved some. Ate breakfast this morning (had bacon). Is passing gas, no bowel movement since being in the hospital.  O: BP (!) 151/88 (BP Location: Left Arm)   Pulse 70   Temp 98.5 F (36.9 C) (Oral)   Resp 16   Ht 5\' 8"  (1.727 m)   Wt 81.6 kg   SpO2 98%   BMI 27.35 kg/m   Gen: no acute distress, pleasant, cooperative  Abdomen: soft, minimally tender to palpation. Normoactive bowel sounds.   A/P:  Pancreatitis - improving subjectively, now tolerating PO. Resident team touched base with GI who did agree with getting inpatient MRCP, so this is pending.  Hypertension improving with addition of amlodipine. Continue to monitor.  Will cosign resident note when it is available.  , MD [facility]

## 2020-04-11 ENCOUNTER — Other Ambulatory Visit (HOSPITAL_COMMUNITY): Payer: Self-pay | Admitting: Family Medicine

## 2020-04-11 DIAGNOSIS — E785 Hyperlipidemia, unspecified: Secondary | ICD-10-CM

## 2020-04-11 LAB — RENAL FUNCTION PANEL
Albumin: 2.8 g/dL — ABNORMAL LOW (ref 3.5–5.0)
Anion gap: 9 (ref 5–15)
BUN: 14 mg/dL (ref 6–20)
CO2: 24 mmol/L (ref 22–32)
Calcium: 8.9 mg/dL (ref 8.9–10.3)
Chloride: 98 mmol/L (ref 98–111)
Creatinine, Ser: 1.29 mg/dL — ABNORMAL HIGH (ref 0.61–1.24)
GFR, Estimated: >60 mL/min (ref >60)
Glucose, Bld: 257 mg/dL — ABNORMAL HIGH (ref 70–99)
Phosphorus: 3.1 mg/dL (ref 2.5–4.6)
Potassium: 4.2 mmol/L (ref 3.5–5.1)
Sodium: 131 mmol/L — ABNORMAL LOW (ref 135–145)

## 2020-04-11 LAB — GLUCOSE, CAPILLARY
Glucose-Capillary: 214 mg/dL — ABNORMAL HIGH (ref 70–99)
Glucose-Capillary: 309 mg/dL — ABNORMAL HIGH (ref 70–99)

## 2020-04-11 MED ORDER — POLYETHYLENE GLYCOL 3350 17 G PO PACK
17.0000 g | PACK | Freq: Every day | ORAL | Status: DC
  Administered 2020-04-11: 17 g via ORAL
  Filled 2020-04-11: qty 1

## 2020-04-11 MED ORDER — AMLODIPINE BESYLATE 10 MG PO TABS
10.0000 mg | ORAL_TABLET | Freq: Every day | ORAL | 0 refills | Status: DC
Start: 2020-04-12 — End: 2020-06-09

## 2020-04-11 MED ORDER — ATORVASTATIN CALCIUM 80 MG PO TABS: 80.0000 mg | ORAL_TABLET | Freq: Every day | ORAL | 0 refills | Status: DC

## 2020-04-11 MED FILL — ATORVASTATIN CALCIUM 80 MG: 80 | 30 days supply | Qty: 30 | Fill #0

## 2020-04-11 MED FILL — AMLODIPINE BESYLATE 10 MG T: 10 | 30 days supply | Qty: 30 | Fill #0

## 2020-04-11 NOTE — Progress Notes (Signed)
Inpatient Diabetes Program Recommendations  AACE/ADA: New Consensus Statement on Inpatient Glycemic Control   Target Ranges:  Prepandial:   less than 140 mg/dL      Peak postprandial:   less than 180 mg/dL (1-2 hours)      Critically ill patients:  140 - 180 mg/dL   Results for Randy [name], Randy [name] (MRN [id number]) as of 04/11/2020 12:58  Ref. Range 04/10/2020 06:27 04/10/2020 11:13 04/10/2020 16:25 04/10/2020 21:41 04/11/2020 06:14 04/11/2020 11:49  Glucose-Capillary Latest Ref Range: 70 - 99 mg/dL 809 (H) 983 (H) 382 (H) 195 (H) 214 (H) 309 (H)  Results for Randy [name], Randy [name] (MRN [id number]) as of 04/11/2020 12:58  Ref. Range 12/07/2019 08:51 04/08/2020 17:03  Hemoglobin A1C Latest Ref Range: 4.8 - 5.6 % 10.2 (H) 11.4 (H)   Review of Glycemic Control  Diabetes history: DM2 Outpatient Diabetes medications: Lantus 20 units QHS, Glipizide 5 mg BID Current orders for Inpatient glycemic control: Novolog 0- 6 units TID with meals   Inpatient Diabetes Program Recommendations:    HbgA1C:  A1C 11.4% on 04/08/20 indicating an average glucose of 280 mg/dl over the past 2-3 months.  NOTE: Spoke with patient over the phone about diabetes and home regimen for diabetes control. Patient reports being followed by PCP for diabetes management and currently taking Lantus 20 units QHS and Glipizide 5 mg BID as an outpatient for diabetes control. Patient reports taking DM medications as prescribed and states that he gets his medications from [facility].  Patient reports that he checks his glucose before breakfast, after breakfast and then sometimes before lunch. Patient reports that glucose is in 120-130's in the morning before breakfast, in low 200's after breakfast and then back down in 100's before lunch.  Inquired about prior A1C and patient reports not being able to recall last A1C value. Discussed A1C results (11.4% on 04/08/20) and explained that current A1C indicates an average  glucose of 280 mg/dl over the past 2-3 months. Discussed glucose and A1C goals. Discussed importance of checking CBGs and maintaining good CBG control to prevent long-term and short-term complications. Explained how hyperglycemia leads to damage within blood vessels which lead to the common complications seen with uncontrolled diabetes. Stressed to the patient the importance of improving glycemic control to prevent further complications from uncontrolled diabetes.  Discussed carbohydrates, carbohydrate goals per day and meal, along with portion sizes. Patient states that he drinks mostly water and diet ginger ale but he notes that he has been eating a lot of fruit (mainly watermelon and grapes). Patient states he was told that he could eat as much watermelon that he wanted. Discussed how fruits have natural sugars and would recommend patient eat portion size of fruits with lower glycemic index (such as berries, apples, and pears).   Encouraged patient to check glucose randomly after lunch, before supper, at at bedtime to see if glucose is staying elevated throughout the day after meal intake. Encouraged patient to follow up with PCP regarding improving DM control.  Patient verbalized understanding of information discussed and reports no further questions at this time related to diabetes.  Thanks, Orlando Penner, RN, MSN, CDE Diabetes Coordinator Inpatient Diabetes Program [phone] (Team Pager)

## 2020-04-11 NOTE — Discharge Summary (Addendum)
[facility] Discharge Summary  Patient name: Randy [name]. Medical record number: [id number] Date of birth: 01-31-1981 Age: 39 y.o. Gender: male Date of Admission: 04/08/2020  Date of Discharge: 04/11/2020 Admitting Physician: Gladys Damme, MD  Primary Care Provider: Nicolette Bang, DO Consultants: None  Indication for Hospitalization: Pancreatitis  Discharge Diagnoses/Problem List:  DMT2, HLD, HTN, Recurrent Pancreatitis of unknown cause  Disposition: Able to be discharged home safely  Discharge Condition: Stable  Discharge Exam:   Constitutional:      General: He is not in acute distress.    Appearance: He is not ill-appearing.  HENT:     Head: Normocephalic and atraumatic.  Cardiovascular:     Rate and Rhythm: Normal rate and regular rhythm.  Pulmonary:     Effort: Pulmonary effort is normal.     Breath sounds: Normal breath sounds.  Abdominal:     General: Abdomen is flat. Bowel sounds are normal.     Palpations: Abdomen is soft.     Tenderness: There is abdominal tenderness in the suprapubic area.     Hernia: No hernia is present.  Skin:    General: Skin is warm.  Neurological:     Mental Status: He is alert.   Brief Hospital Course:   Hypertensive Emergency Patient presented to ED with increasing back pain and BP 267/133. CXR did not show widened mediastinum, CTA dissection study showed no aortic changes.  Patient continued on home Coreg and Lisinopril.  Received doses of IV Labetalol throughout stay with minimal improvement.  Also started on Amlodipine 10 mg to be continued at discharge.  Patient BP better controlled in 130's-170's at time of discharge.  Given strict return precautions and strongly encouraged to follow-up with PCP and monitoring of BP at home.  Pancreatitis Lipase elevated to 200 and CTA shows mild pancreatitis. Started on IV LR fluids and morphine for pain and zofran for nausea.  Patient had  previous MRCP scheduled outpatient   Continue IVF at slightly reduced rate to encourage PO hydration as able to tolerate.  Lipid panel shows elevated triglycerides to 377, but not elevated enough for etiology for pancreatitis. EtOH <10.  RUG normal.  MRCP obtained and was also found to be normal.  Pain largely resolved after 24 hours of fluids and patient had good PO tolerance.  Able to be discharged home safely.  Issues for Follow Up:  1. Hypertension- Patient needs to closely monitor BP at home and medication titration for good BP control. 2. Type 2 Diabetes- Consider adding on SGLT2 to patient's regimen for kidney protection.  F/u BMP.  Significant Procedures: None  Significant Labs and Imaging:  Recent Labs  Lab 04/08/20 1703 04/09/20 0331  WBC 7.5 6.6  HGB 11.0* 10.1*  HCT 34.2* 31.0*  PLT 317 263   Recent Labs  Lab 04/08/20 1703 04/08/20 1703 04/09/20 0331 04/11/20 0825  NA 135  --  137 131*  K 4.1   < > 4.0 4.2  CL 100  --  103 98  CO2 26  --  23 24  GLUCOSE 158*  --  87 257*  BUN 11  --  9 14  CREATININE 1.26*  --  1.18 1.29*  CALCIUM 9.2  --  8.9 8.9  PHOS  --   --   --  3.1  ALKPHOS 79  --   --   --   AST 28  --   --   --   ALT  18  --   --   --   ALBUMIN 3.3*  --   --  2.8*   < > = values in this interval not displayed.   EXAM: CT ANGIOGRAPHY CHEST, ABDOMEN AND PELVIS  TECHNIQUE: Non-contrast CT of the chest was initially obtained.  Multidetector CT imaging through the chest, abdomen and pelvis was performed using the standard protocol during bolus administration of intravenous contrast. Multiplanar reconstructed images and MIPs were obtained and reviewed to evaluate the vascular anatomy.  CONTRAST:  OMNIPAQUE IOHEXOL 350 MG/ML SOLN  COMPARISON:  April 04, 2020  FINDINGS: CTA CHEST FINDINGS  Cardiovascular: Satisfactory opacification of the pulmonary arteries to the segmental level. No evidence of pulmonary embolism. Normal heart  size. No pericardial effusion.  Mediastinum/Nodes: No enlarged mediastinal, hilar, or axillary lymph nodes. Thyroid gland, trachea, and esophagus demonstrate no significant findings.  Lungs/Pleura: Lungs are clear. No pleural effusion or pneumothorax.  Musculoskeletal: No chest wall abnormality. No acute or significant osseous findings.  Review of the MIP images confirms the above findings.  CTA ABDOMEN AND PELVIS FINDINGS  VASCULAR  Aorta: Normal caliber aorta without aneurysm, dissection, vasculitis or significant stenosis.  Celiac: Patent without evidence of aneurysm, dissection, vasculitis or significant stenosis.  SMA: Patent without evidence of aneurysm, dissection, vasculitis or significant stenosis.  Renals: Both renal arteries are patent without evidence of aneurysm, dissection, vasculitis, fibromuscular dysplasia or significant stenosis.  IMA: Patent without evidence of aneurysm, dissection, vasculitis or significant stenosis.  Inflow: Patent without evidence of aneurysm, dissection, vasculitis or significant stenosis.  Veins: No obvious venous abnormality within the limitations of this arterial phase study.  Review of the MIP images confirms the above findings.  NON-VASCULAR  Hepatobiliary: No focal liver abnormality is seen. No gallstones, gallbladder wall thickening, or biliary dilatation.  Pancreas: A very mild amount of peripancreatic inflammatory fat stranding is seen at the junction of the pancreatic body and head.  Spleen: Normal in size without focal abnormality.  Adrenals/Urinary Tract: Adrenal glands are unremarkable. Kidneys are normal, without renal calculi, focal lesion, or hydronephrosis. Bladder is unremarkable.  Stomach/Bowel: A small, stable hiatal hernia is seen. Appendix appears normal. No evidence of bowel wall thickening, distention, or inflammatory changes.  Lymphatic: No abnormal abdominal or pelvic  lymph nodes are identified.  Reproductive: Prostate is unremarkable.  Other: No abdominal wall hernia or abnormality. No abdominopelvic ascites.  Musculoskeletal: No acute or significant osseous findings.  Review of the MIP images confirms the above findings.  IMPRESSION: 1. Findings suggestive of very mild acute pancreatitis. Correlation with pancreatic enzymes is recommended. 2. Small, stable hiatal hernia.   Electronically Signed   By: Aram Candela M.D.   On: 04/08/2020 20:27  EXAM: ULTRASOUND ABDOMEN LIMITED RIGHT UPPER QUADRANT  COMPARISON:  CT angio chest, abdomen and pelvis 04/08/2020 and abdominal sonogram 10/01/2019  FINDINGS: Gallbladder:  No gallstones or wall thickening visualized. No sonographic Murphy sign noted by sonographer.  Common bile duct:  Diameter: 8.2 mm  Liver:  No focal lesion identified. Within normal limits in parenchymal echogenicity. Portal vein is patent on color Doppler imaging with normal direction of blood flow towards the liver.  Other: None.  IMPRESSION: 1. No evidence of acute cholecystitis. 2. Increased caliber of the common bile duct measures 8.2 mm. On 10/01/2019 this measured 7 mm.  EXAM: MRI ABDOMEN WITHOUT AND WITH CONTRAST (INCLUDING MRCP)  TECHNIQUE: Multiplanar multisequence MR imaging of the abdomen was performed both before and after the administration of intravenous contrast. Heavily  T2-weighted images of the biliary and pancreatic ducts were obtained, and three-dimensional MRCP images were rendered by post processing.  CONTRAST:  70mL GADAVIST GADOBUTROL 1 MMOL/ML IV SOLN  COMPARISON:  CT scan 04/04/2020  FINDINGS: Lower chest: Unremarkable.  Hepatobiliary: No suspicious focal abnormality within the liver parenchyma. No evidence for gallstones. No intrahepatic biliary duct dilatation. Extrahepatic common duct is mildly distended 8 mm diameter. No evidence for  choledocholithiasis.  Pancreas: No focal mass lesion. No dilatation of the main duct. No intraparenchymal cyst. No peripancreatic edema. No evidence for pancreas divisum.  Spleen:  No splenomegaly. No focal mass lesion.  Adrenals/Urinary Tract: No adrenal nodule or mass. Kidneys unremarkable.  Stomach/Bowel: Stomach is unremarkable. No gastric wall thickening. No evidence of outlet obstruction. Duodenum is normally positioned as is the ligament of Treitz. No small bowel or colonic dilatation within the visualized abdomen.  Vascular/Lymphatic: No abdominal aortic aneurysm. No abdominal lymphadenopathy. Upper normal lymph nodes are seen in the hepatoduodenal ligament.  Other:  No intraperitoneal free fluid.  Musculoskeletal: No focal suspicious marrow enhancement within the visualized bony anatomy.  IMPRESSION: 1. Mild extrahepatic biliary duct dilatation without evidence for choledocholithiasis. No findings to suggest pancreatitis. 2. No acute findings in the abdomen.   Electronically Signed   By: Misty [name] M.D.   On: 04/10/2020 20:41  Results/Tests Pending at Time of Discharge: None  Discharge Medications:  Allergies as of 04/11/2020   No Known Allergies     Medication List    TAKE these medications   amLODipine 10 MG tablet Commonly known as: NORVASC Take 1 tablet (10 mg total) by mouth daily. Start taking on: April 12, 2020   atorvastatin 80 MG tablet Commonly known as: LIPITOR Take 1 tablet (80 mg total) by mouth daily.   carvedilol 25 MG tablet Commonly known as: COREG Take 1 tablet (25 mg total) by mouth 2 (two) times daily with a meal.   glipiZIDE 5 MG tablet Commonly known as: GLUCOTROL Take 1 tablet (5 mg total) by mouth 2 (two) times daily before a meal.   glucose blood test strip Use as instructed to check TID. E11.65 What changed:   how much to take  how to take this  when to take this   ibuprofen 200 MG  tablet Commonly known as: ADVIL Take 400 mg by mouth every 6 (six) hours as needed (pain).   insulin glargine 100 UNIT/ML Solostar Pen Commonly known as: LANTUS Inject 22 Units into the skin daily. What changed:   how much to take  when to take this   lisinopril 40 MG tablet Commonly known as: ZESTRIL Take 1 tablet (40 mg total) by mouth daily.   omeprazole 40 MG capsule Commonly known as: PRILOSEC Take 1 capsule (40 mg total) by mouth daily.   ondansetron 4 MG disintegrating tablet Commonly known as: Zofran ODT Take 1 tablet (4 mg total) by mouth every 8 (eight) hours as needed for nausea or vomiting.   sildenafil 100 MG tablet Commonly known as: Viagra Take 0.5-1 tablets (50-100 mg total) by mouth daily as needed for erectile dysfunction.   True Metrix Meter w/Device Kit Use to check blood sugar TID. E11.65 What changed:   how much to take  how to take this  when to take this   TRUEplus 5-Bevel Pen Needles 32G X 4 MM Misc Generic drug: Insulin Pen Needle Use to inject insulin daily. What changed:   how much to take  how to take this  when  to take this   TRUEplus Lancets 28G Misc Use to check blood sugar three times daily. E11.65 What changed:   how much to take  how to take this  when to take this       Discharge Instructions: Please refer to Patient Instructions section of EMR for full details.  Patient was counseled important signs and symptoms that should prompt return to medical care, changes in medications, dietary instructions, activity restrictions, and follow up appointments.   Follow-Up Appointments:  Follow-up Information    Nicolette Bang, DO. Schedule an appointment as soon as possible for a visit in 3 day(s).   Specialty: Family Medicine Contact information: [place] [address] [phone]               Delora Fuel, MD 04/11/2020, 4:41 PM PGY-1, [place]  Upper-Level Resident Addendum I have discussed the above with the original author and agree with their documentation. My edits for correction/addition/clarification are included. Please see also any attending notes.   Wilber Oliphant, M.D.  PGY-3 04/11/2020 5:26 PM

## 2020-04-11 NOTE — Progress Notes (Signed)
Pt IV removed, cathter intact. Pt understands d/c instructions. Pt has all belongings. Pt d/c via wheelchair by RN.

## 2020-04-11 NOTE — Discharge Instructions (Signed)
Dear [name].,   Thank you for letting us participate in your care! In this section, you will find a brief hospital admission summary of why you were admitted to the hospital, what happened during your admission, your diagnosis/diagnoses, and recommended follow up.   You were admitted because you were experiencing Pancreatitis and High blood presure.   You were treated with fluids and medications to bring down your blood pressure.   Your Pancreatitis and High blood pressure improved and you were discharged from the hospital for meeting this goal.    POST-HOSPITAL & CARE INSTRUCTIONS 1. Please continue to take your Amlodipine daily and continue taking the rest of your medications at home. 2. Come back to hospital if you have severe abdominal pain, nausea and vomiting, or bad headache with changes in your vision.  DOCTOR'S APPOINTMENTS & FOLLOW UP No future appointments.  Please schedule a follow-up appointment with your Primary Care doctor.  Thank you for choosing [facility]! Take care and be well!  Family Medicine Teaching Service Inpatient Team [place]  [facility]  [address] [place], [place] [address] [phone]

## 2020-04-11 NOTE — Care Management (Signed)
Entered in [facility] no co pay for Lipitor and Norvasc . TOC pharmacy will deliver medications to his room.   Ronny Flurry RN

## 2020-04-12 ENCOUNTER — Telehealth: Payer: Self-pay

## 2020-04-12 NOTE — Telephone Encounter (Signed)
-----   Message from Napoleon Form, MD sent at 04/11/2020  8:14 PM EST ----- Regarding: RE: MRCP Results Dr Miquel Dunn, we will bring him for follow up visit.  Beth, can you please schedule follow up in 6-8 weeks with me or APP. May need to consider EUS given he has extrahepatic dilation of CBD. Thanks ----- Message ----- From: Billey Co, MD Sent: 04/11/2020   6:45 PM EST To: Napoleon Form, MD Subject: MRCP Results                                   Hi Dr Lavon Paganini,  This is patient that you had previously seen in consultation on 10/07/2019, at which time you ordered an MRCP for evaluation of CBD sludge or stones. He received this inpatient due to admission for pancreatitis, results below.   FINDINGS: Lower chest: Unremarkable.  Hepatobiliary: No suspicious focal abnormality within the liver parenchyma. No evidence for gallstones. No intrahepatic biliary duct dilatation. Extrahepatic common duct is mildly distended 8 mm diameter. No evidence for choledocholithiasis.  Pancreas: No focal mass lesion. No dilatation of the main duct. No intraparenchymal cyst. No peripancreatic edema. No evidence for pancreas divisum.  Spleen:  No splenomegaly. No focal mass lesion.  Adrenals/Urinary Tract: No adrenal nodule or mass. Kidneys unremarkable.  Stomach/Bowel: Stomach is unremarkable. No gastric wall thickening. No evidence of outlet obstruction. Duodenum is normally positioned as is the ligament of Treitz. No small bowel or colonic dilatation within the visualized abdomen.  Vascular/Lymphatic: No abdominal aortic aneurysm. No abdominal lymphadenopathy. Upper normal lymph nodes are seen in the hepatoduodenal ligament.  Other:  No intraperitoneal free fluid.  Musculoskeletal: No focal suspicious marrow enhancement within the visualized bony anatomy.  IMPRESSION: 1. Mild extrahepatic biliary duct dilatation without evidence for choledocholithiasis. No findings to  suggest pancreatitis. 2. No acute findings in the abdomen.    --- I just wanted to check for follow up as he was discharged today if there is any other outpatient follow-up you would recommend that we can send to his PCP, thanks so much for your time!  Dr Miquel Dunn

## 2020-04-12 NOTE — Telephone Encounter (Signed)
Called the "preferred" phone number. Phone is disconnected. Called the home number listed. No answer. Left a message on the voicemail about the reason for th ecall. Asked for a call back to schedule an appointment with Dr Lavon Paganini or an APP in 6 to 8 weeks.

## 2020-04-12 NOTE — Telephone Encounter (Signed)
Transition Care Management Follow-up Telephone Call  Date of discharge and from where: 04/11/2020, [facility]   How have you been since you were released from the hospital? He stated he is doing okay  Any questions or concerns? Yes - he would like a referral for an eye exam.  He has no insurance  Items Reviewed:  Did the pt receive and understand the discharge instructions provided? Yes   Medications obtained and verified? he said that he has all medications including the amlodipine and he did not have any questions about his med regime at this time  Other? No   Any new allergies since your discharge? No   Do you have support at home? Yes , his wife  Home Care and Equipment/Supplies: Were home health services ordered? no If so, what is the name of the agency? n/a Has the agency set up a time to come to the patient's home?n/a Were any new equipment or medical supplies ordered?  No What is the name of the medical supply agency? n/a Were you able to get the supplies/equipment?n/a Do you have any questions related to the use of the equipment or supplies? No, n/a  He has a glucometer and said that he checks his blood sugar three times daily.  This morning it was 200.  Functional Questionnaire: (I = Independent and D = Dependent) ADLs: independent  Follow up appointments reviewed:   PCP Hospital f/u appt confirmed? scheduled appointment with Dr Earlene Plater - 04/18/2020   Specialist Hospital f/u appt confirmed? No  - none scheduled at this time  Are transportation arrangements needed? No   If their condition worsens, is the pt aware to call PCP or go to the Emergency Dept.? yes  Was the patient provided with contact information for the PCP's office or ED? He said he has the phone number for [facility]  Was to pt encouraged to call back with questions or concerns? yes

## 2020-04-14 NOTE — Telephone Encounter (Signed)
Patient called back returning your call.

## 2020-04-14 NOTE — Telephone Encounter (Signed)
I have scheduled his appointment. Please send out the new patient information with his appointment. Thanks you!

## 2020-04-14 NOTE — Telephone Encounter (Signed)
Called the patient. No answer. No voicemail picked up. Phone rings then turns to busy signal.

## 2020-04-18 ENCOUNTER — Ambulatory Visit: Payer: Self-pay | Admitting: Internal Medicine

## 2020-04-18 DIAGNOSIS — Z09 Encounter for follow-up examination after completed treatment for conditions other than malignant neoplasm: Secondary | ICD-10-CM

## 2020-04-18 DIAGNOSIS — E1165 Type 2 diabetes mellitus with hyperglycemia: Secondary | ICD-10-CM

## 2020-04-18 DIAGNOSIS — K859 Acute pancreatitis without necrosis or infection, unspecified: Secondary | ICD-10-CM

## 2020-04-18 DIAGNOSIS — I160 Hypertensive urgency: Secondary | ICD-10-CM

## 2020-04-18 MED FILL — SILDENAFIL CITRATE 100 MG T: 100 | 30 days supply | Qty: 10 | Fill #1

## 2020-04-27 ENCOUNTER — Other Ambulatory Visit: Payer: Self-pay

## 2020-04-27 ENCOUNTER — Emergency Department (HOSPITAL_COMMUNITY)
Admission: EM | Admit: 2020-04-27 | Discharge: 2020-04-28 | Disposition: A | Discharge status: Home / Self Care | Payer: Self-pay | Attending: Emergency Medicine | Admitting: Emergency Medicine

## 2020-04-27 ENCOUNTER — Encounter (HOSPITAL_COMMUNITY): Payer: Self-pay | Admitting: Pharmacy Technician

## 2020-04-27 DIAGNOSIS — R1110 Vomiting, unspecified: Secondary | ICD-10-CM | POA: Insufficient documentation

## 2020-04-27 DIAGNOSIS — Z5321 Procedure and treatment not carried out due to patient leaving prior to being seen by health care provider: Secondary | ICD-10-CM | POA: Insufficient documentation

## 2020-04-27 LAB — CBC
HCT: 35.4 % — ABNORMAL LOW (ref 39.0–52.0)
Hemoglobin: 11.2 g/dL — ABNORMAL LOW (ref 13.0–17.0)
MCH: 26.7 pg (ref 26.0–34.0)
MCHC: 31.6 g/dL (ref 30.0–36.0)
MCV: 84.3 fL (ref 80.0–100.0)
Platelets: 269 10*3/uL (ref 150–400)
RBC: 4.2 MIL/uL — ABNORMAL LOW (ref 4.22–5.81)
RDW: 12.2 % (ref 11.5–15.5)
WBC: 5.2 10*3/uL (ref 4.0–10.5)
nRBC: 0 % (ref 0.0–0.2)

## 2020-04-27 LAB — COMPREHENSIVE METABOLIC PANEL
ALT: 25 U/L (ref 0–44)
AST: 24 U/L (ref 15–41)
Albumin: 3.1 g/dL — ABNORMAL LOW (ref 3.5–5.0)
Alkaline Phosphatase: 116 U/L (ref 38–126)
Anion gap: 11 (ref 5–15)
BUN: 16 mg/dL (ref 6–20)
CO2: 24 mmol/L (ref 22–32)
Calcium: 9.4 mg/dL (ref 8.9–10.3)
Chloride: 100 mmol/L (ref 98–111)
Creatinine, Ser: 1.14 mg/dL (ref 0.61–1.24)
GFR, Estimated: >60 mL/min (ref >60)
Glucose, Bld: 202 mg/dL — ABNORMAL HIGH (ref 70–99)
Potassium: 4 mmol/L (ref 3.5–5.1)
Sodium: 135 mmol/L (ref 135–145)
Total Bilirubin: 0.7 mg/dL (ref 0.3–1.2)
Total Protein: 7 g/dL (ref 6.5–8.1)

## 2020-04-27 LAB — LIPASE, BLOOD: Lipase: 32 U/L (ref 11–51)

## 2020-04-27 LAB — CBG MONITORING, ED: Glucose-Capillary: 162 mg/dL — ABNORMAL HIGH (ref 70–99)

## 2020-04-27 MED ORDER — ONDANSETRON HCL 4 MG/2ML IJ SOLN
4.0000 mg | Freq: Once | INTRAMUSCULAR | Status: AC | PRN
  Administered 2020-04-27: 19:00:00 4 mg via INTRAVENOUS
  Filled 2020-04-27: qty 2

## 2020-04-27 MED ORDER — ONDANSETRON 4 MG PO TBDP: 4.0000 mg | ORAL_TABLET | Freq: Once | ORAL | Status: DC | PRN

## 2020-04-27 NOTE — ED Notes (Signed)
LWBS 

## 2020-04-27 NOTE — ED Notes (Signed)
Pt arrived via ems and history of pancreatitis with abdominal pain.  EMS gave patient Zofran 4mg  en route to ED.

## 2020-04-27 NOTE — ED Triage Notes (Signed)
Pt here via pov with reports of ? Pancreatitis flare up. Pt states this hx of same. Pt vomiting in triage.

## 2020-04-28 LAB — URINALYSIS, ROUTINE W REFLEX MICROSCOPIC
Bilirubin Urine: NEGATIVE
Glucose, UA: 150 mg/dL — AB
Ketones, ur: NEGATIVE mg/dL
Nitrite: NEGATIVE
Protein, ur: >=300 mg/dL — AB
Specific Gravity, Urine: 1.018 (ref 1.005–1.030)
pH: 5 (ref 5.0–8.0)

## 2020-05-01 ENCOUNTER — Telehealth: Payer: Self-pay | Admitting: Internal Medicine

## 2020-05-01 NOTE — Telephone Encounter (Signed)
I return Pt call, Unable to reach him or LVM

## 2020-05-01 NOTE — Telephone Encounter (Signed)
Copied from CRM [id number]. Topic: General - Other >> May 01, 2020  7:33 AM Lyn Hollingshead D wrote: PT need an appt with Mikle Bosworth / please advise  Scheduled appointment with financial next Thurs at 3. Patient's wife is requesting clarification on the documents needed for that appointment as she states her husband does not work. Please follow up.

## 2020-05-03 ENCOUNTER — Telehealth: Payer: Self-pay | Admitting: Internal Medicine

## 2020-05-03 NOTE — Telephone Encounter (Signed)
Copied from CRM [id number]. Topic: General - Other >> May 03, 2020  1:27 PM Uvaldo Rising, Ja-Kwan wrote: Reason for CRM: Pt wife stated she was suppose to receive call from the office to advise her of the financial amount that a couple can make to be approved for financial assistance. Pt wife requests call back. Cb# [phone]  Please follow up.

## 2020-05-03 NOTE — Telephone Encounter (Signed)
Randy [name], this is a Doctor, general practice. Please reread the request and address the concern with the patient.

## 2020-05-03 NOTE — Telephone Encounter (Signed)
I return Pt call, unable to LVM since does not have VM

## 2020-05-11 ENCOUNTER — Ambulatory Visit: Payer: Self-pay

## 2020-05-11 MED FILL — SILDENAFIL CITRATE 100 MG T: 100 | 15 days supply | Qty: 5 | Fill #2

## 2020-05-16 ENCOUNTER — Telehealth: Payer: Self-pay | Admitting: Internal Medicine

## 2020-05-16 NOTE — Telephone Encounter (Signed)
Copied from CRM [id number]. Topic: General - Other >> May 16, 2020 10:05 AM Jaquita Rector A wrote: Patients wife called in again waiting to hear from someone about her husbands issues stated that he had an appointment that was cancelled, and the inconvenience to her family loosing income taking time off work and then the appointment not taking place. Please call her with an update on what will her next step be to help patient get the help that they need. Ph# [phone]

## 2020-05-18 NOTE — Telephone Encounter (Signed)
I return Pt call, NO dial tone unable to LVM, also pt need to apply for medicaid or bring a Denied letter from [facility] to apply for financial application

## 2020-05-22 ENCOUNTER — Other Ambulatory Visit: Payer: Self-pay | Admitting: Internal Medicine

## 2020-05-22 ENCOUNTER — Other Ambulatory Visit: Payer: Self-pay

## 2020-05-22 ENCOUNTER — Ambulatory Visit
Admission: EM | Admit: 2020-05-22 | Discharge: 2020-05-22 | Disposition: A | Discharge status: Home / Self Care | Payer: Self-pay | Attending: Internal Medicine | Admitting: Internal Medicine

## 2020-05-22 ENCOUNTER — Encounter: Payer: Self-pay | Admitting: *Deleted

## 2020-05-22 DIAGNOSIS — I10 Essential (primary) hypertension: Secondary | ICD-10-CM

## 2020-05-22 DIAGNOSIS — I160 Hypertensive urgency: Secondary | ICD-10-CM

## 2020-05-22 MED ORDER — LISINOPRIL 40 MG PO TABS: 40.0000 mg | ORAL_TABLET | Freq: Every day | ORAL | 1 refills | Status: DC

## 2020-05-22 MED FILL — LISINOPRIL 40 MG TABLET: 40 | 30 days supply | Qty: 30 | Fill #0

## 2020-05-22 NOTE — ED Triage Notes (Signed)
Pt has Norvasc at home but has not been taking because she did not know what the med was for BP

## 2020-05-22 NOTE — ED Provider Notes (Signed)
[facility]    CSN: [id number] Arrival date & time: 05/22/20  0934      History   Chief Complaint Chief Complaint  Patient presents with  . Hypertension    HPI Randy [name]. is a 40 y.o. male comes to the urgent care with concerns about elevated blood pressure.  Patient has a history of hypertension on lisinopril and amlodipine.  Patient is compliant with lisinopril but has not been taking amlodipine because he did not realize that amlodipine was for blood pressure control.  He denies any chest pain or abdominal pain.  He has a slight occipital headache which is currently 3 out of 10.   No nausea or vomiting.  Patient ran out of lisinopril 2 days ago and has not taken any antihypertensive agents for the past 2 days.  HPI  Past Medical History:  Diagnosis Date  . HLD (hyperlipidemia)   . Hypertension   . Pancreatitis   . Type II diabetes mellitus [facility])     Patient Active Problem List   Diagnosis Date Noted  . Type 2 diabetes mellitus without complication, with long-term current use of insulin ([place])   . Pancreatitis 04/08/2020  . Hyperlipidemia 06/01/2019  . Microalbuminuria due to type 2 diabetes mellitus ([place]) 06/01/2019  . Essential hypertension 05/31/2019  . Hypertensive urgency 08/31/2015  . Uncontrolled diabetes mellitus ([place]) 08/31/2015  . Marijuana abuse 08/31/2015    Past Surgical History:  Procedure Laterality Date  . NO PAST SURGERIES         Home Medications    Prior to Admission medications   Medication Sig Start Date End Date Taking? Authorizing Provider  atorvastatin (LIPITOR) 80 MG tablet Take 1 tablet (80 mg total) by mouth daily. 04/11/20  Yes Maness, Arnette Norris, MD  Blood Glucose Monitoring Suppl (TRUE METRIX METER) w/Device KIT Use to check blood sugar TID. E11.65 Patient taking differently: 1 each by Other route in the morning, at noon, and at bedtime. Use to check blood sugar TID. E11.65 06/10/19  Yes Charlott Rakes, MD   carvedilol (COREG) 25 MG tablet Take 1 tablet (25 mg total) by mouth 2 (two) times daily with a meal. 03/22/20  Yes Hayden Rasmussen, MD  glipiZIDE (GLUCOTROL) 5 MG tablet Take 1 tablet (5 mg total) by mouth 2 (two) times daily before a meal. 03/22/20  Yes Hayden Rasmussen, MD  glucose blood test strip Use as instructed to check TID. E11.65 Patient taking differently: 1 each by Other route in the morning, at noon, and at bedtime. Use as instructed to check TID. E11.65 06/10/19  Yes Newlin, Enobong, MD  Insulin Pen Needle (TRUEPLUS 5-BEVEL PEN NEEDLES) 32G X 4 MM MISC Use to inject insulin daily. Patient taking differently: 1 each by Other route daily. Use to inject insulin daily. 06/10/19  Yes Newlin, Charlane Ferretti, MD  ondansetron (ZOFRAN ODT) 4 MG disintegrating tablet Take 1 tablet (4 mg total) by mouth every 8 (eight) hours as needed for nausea or vomiting. 03/22/20  Yes Hayden Rasmussen, MD  TRUEplus Lancets 28G MISC Use to check blood sugar three times daily. E11.65 Patient taking differently: 1 each by Other route in the morning, at noon, and at bedtime. Use to check blood sugar three times daily. E11.65 06/10/19  Yes Charlott Rakes, MD  amLODipine (NORVASC) 10 MG tablet Take 1 tablet (10 mg total) by mouth daily. 04/12/20   Maness, Arnette Norris, MD  ibuprofen (ADVIL) 200 MG tablet Take 400 mg by mouth every 6 (  six) hours as needed (pain).    [provider]  insulin glargine (LANTUS) 100 UNIT/ML Solostar Pen Inject 22 Units into the skin daily. Patient taking differently: Inject 24 Units into the skin daily after breakfast.  09/01/19 05/12/20  Nicolette Bang, DO  lisinopril (ZESTRIL) 40 MG tablet Take 1 tablet (40 mg total) by mouth daily. 05/22/20   Chase Picket, MD  omeprazole (PRILOSEC) 40 MG capsule Take 1 capsule (40 mg total) by mouth daily. 10/07/19   Mauri Pole, MD  sildenafil (VIAGRA) 100 MG tablet Take 0.5-1 tablets (50-100 mg total) by mouth daily as needed for  erectile dysfunction. 11/30/19   Nicolette Bang, DO    Family History Family History  Problem Relation Age of Onset  . Hypertension Mother   . Diabetes Father   . Hypertension Father   . Diabetes Brother   . Diabetes Maternal Grandmother   . Colon cancer Neg Hx   . Esophageal cancer Neg Hx   . Rectal cancer Neg Hx   . Stomach cancer Neg Hx     Social History Social History   Tobacco Use  . Smoking status: Former Smoker    Packs/day: 0.33    Years: 17.00    Pack years: 5.61    Types: Cigarettes    Quit date: 12/26/2011    Years since quitting: 8.4  . Smokeless tobacco: Never Used  Vaping Use  . Vaping Use: Never used  Substance Use Topics  . Alcohol use: No    Comment: none in6-7 years  . Drug use: Yes    Types: Marijuana    Comment: Nov 2020     Allergies   Patient has no known allergies.   Review of Systems Review of Systems  Respiratory: Negative for cough and shortness of breath.   Cardiovascular: Negative for chest pain.  Gastrointestinal: Negative for abdominal pain.  Neurological: Positive for headaches.     Physical Exam Triage Vital Signs ED Triage Vitals  Enc Vitals Group     BP 05/22/20 1057 (!) 193/126     Pulse Rate 05/22/20 1057 82     Resp 05/22/20 1057 16     Temp 05/22/20 1057 98.1 F (36.7 C)     Temp Source 05/22/20 1057 Oral     SpO2 05/22/20 1057 98 %     Weight --      Height --      Head Circumference --      Peak Flow --      Pain Score 05/22/20 1059 6     Pain Loc --      Pain Edu? --      Excl. in [place]? --    No data found.  Updated Vital Signs BP (!) 193/126 (BP Location: Left Arm)   Pulse 82   Temp 98.1 F (36.7 C) (Oral)   Resp 16   SpO2 98%   Visual Acuity Right Eye Distance:   Left Eye Distance:   Bilateral Distance:    Right Eye Near:   Left Eye Near:    Bilateral Near:     Physical Exam Vitals and nursing note reviewed.  Constitutional:      General: He is not in acute distress.     Appearance: He is not ill-appearing.  Cardiovascular:     Rate and Rhythm: Normal rate and regular rhythm.     Pulses: Normal pulses.     Heart sounds: Normal heart sounds.  Pulmonary:  Effort: Pulmonary effort is normal.     Breath sounds: Normal breath sounds.  Neurological:     Mental Status: He is alert.      UC Treatments / Results  Labs (all labs ordered are listed, but only abnormal results are displayed) Labs Reviewed - No data to display  EKG   Radiology No results found.  Procedures Procedures (including critical care time)  Medications Ordered in UC Medications - No data to display  Initial Impression / Assessment and Plan / UC Course  I have reviewed the triage vital signs and the nursing notes.  Pertinent labs & imaging results that were available during my care of the patient were reviewed by me and considered in my medical decision making (see chart for details).     1. Hypertensive Urgency: Refill Lisinopril Patient is advised to take Amlodipine If patient develops any worsening symptoms he is advised to go to the emergency room to be evaluated and managed further. Final Clinical Impressions(s) / UC Diagnoses   Final diagnoses:  Hypertensive urgency     Discharge Instructions     Please take medications as prescribed If you develop severe headache, chest pain, abdominal pain, dizziness, numbness or tingling please go to the emergency room immediately.   ED Prescriptions    Medication Sig Dispense Auth. Provider   lisinopril (ZESTRIL) 40 MG tablet Take 1 tablet (40 mg total) by mouth daily. 90 tablet Naseem Adler, Myrene Galas, MD     PDMP not reviewed this encounter.   Chase Picket, MD 05/22/20 1255

## 2020-05-22 NOTE — Discharge Instructions (Addendum)
Please take medications as prescribed If you develop severe headache, chest pain, abdominal pain, dizziness, numbness or tingling please go to the emergency room immediately.

## 2020-05-22 NOTE — ED Triage Notes (Signed)
Pt reports his BP was high when he checked it at home today.

## 2020-05-23 ENCOUNTER — Encounter: Payer: Self-pay | Admitting: Family

## 2020-05-23 NOTE — Progress Notes (Signed)
Patient did not show for appointment.   

## 2020-05-26 ENCOUNTER — Ambulatory Visit: Payer: Self-pay | Admitting: Gastroenterology

## 2020-06-09 ENCOUNTER — Other Ambulatory Visit (HOSPITAL_COMMUNITY): Payer: Self-pay | Admitting: Student in an Organized Health Care Education/Training Program

## 2020-06-09 ENCOUNTER — Emergency Department (HOSPITAL_COMMUNITY)
Admission: EM | Admit: 2020-06-09 | Discharge: 2020-06-10 | Disposition: A | Discharge status: Home / Self Care | Payer: Self-pay | Attending: Emergency Medicine | Admitting: Emergency Medicine

## 2020-06-09 ENCOUNTER — Encounter (HOSPITAL_COMMUNITY): Payer: Self-pay | Admitting: Emergency Medicine

## 2020-06-09 ENCOUNTER — Emergency Department (HOSPITAL_COMMUNITY): Payer: Self-pay

## 2020-06-09 DIAGNOSIS — E119 Type 2 diabetes mellitus without complications: Secondary | ICD-10-CM | POA: Insufficient documentation

## 2020-06-09 DIAGNOSIS — K859 Acute pancreatitis without necrosis or infection, unspecified: Secondary | ICD-10-CM | POA: Insufficient documentation

## 2020-06-09 DIAGNOSIS — Z87891 Personal history of nicotine dependence: Secondary | ICD-10-CM | POA: Insufficient documentation

## 2020-06-09 DIAGNOSIS — Z794 Long term (current) use of insulin: Secondary | ICD-10-CM | POA: Insufficient documentation

## 2020-06-09 DIAGNOSIS — Z79899 Other long term (current) drug therapy: Secondary | ICD-10-CM | POA: Insufficient documentation

## 2020-06-09 DIAGNOSIS — Z7984 Long term (current) use of oral hypoglycemic drugs: Secondary | ICD-10-CM | POA: Insufficient documentation

## 2020-06-09 DIAGNOSIS — I10 Essential (primary) hypertension: Secondary | ICD-10-CM | POA: Insufficient documentation

## 2020-06-09 LAB — COMPREHENSIVE METABOLIC PANEL
ALT: 21 U/L (ref 0–44)
AST: 18 U/L (ref 15–41)
Albumin: 2.9 g/dL — ABNORMAL LOW (ref 3.5–5.0)
Alkaline Phosphatase: 135 U/L — ABNORMAL HIGH (ref 38–126)
Anion gap: 9 (ref 5–15)
BUN: 13 mg/dL (ref 6–20)
CO2: 27 mmol/L (ref 22–32)
Calcium: 8.7 mg/dL — ABNORMAL LOW (ref 8.9–10.3)
Chloride: 99 mmol/L (ref 98–111)
Creatinine, Ser: 1.15 mg/dL (ref 0.61–1.24)
GFR, Estimated: >60 mL/min (ref >60)
Glucose, Bld: 179 mg/dL — ABNORMAL HIGH (ref 70–99)
Potassium: 3.7 mmol/L (ref 3.5–5.1)
Sodium: 135 mmol/L (ref 135–145)
Total Bilirubin: 0.5 mg/dL (ref 0.3–1.2)
Total Protein: 6.6 g/dL (ref 6.5–8.1)

## 2020-06-09 LAB — URINALYSIS, ROUTINE W REFLEX MICROSCOPIC
Bilirubin Urine: NEGATIVE
Glucose, UA: 150 mg/dL — AB
Ketones, ur: NEGATIVE mg/dL
Nitrite: NEGATIVE
Protein, ur: >=300 mg/dL — AB
Specific Gravity, Urine: 1.013 (ref 1.005–1.030)
pH: 7 (ref 5.0–8.0)

## 2020-06-09 LAB — CBC
HCT: 31.3 % — ABNORMAL LOW (ref 39.0–52.0)
Hemoglobin: 10.4 g/dL — ABNORMAL LOW (ref 13.0–17.0)
MCH: 27.5 pg (ref 26.0–34.0)
MCHC: 33.2 g/dL (ref 30.0–36.0)
MCV: 82.8 fL (ref 80.0–100.0)
Platelets: 325 10*3/uL (ref 150–400)
RBC: 3.78 MIL/uL — ABNORMAL LOW (ref 4.22–5.81)
RDW: 13 % (ref 11.5–15.5)
WBC: 6.5 10*3/uL (ref 4.0–10.5)
nRBC: 0 % (ref 0.0–0.2)

## 2020-06-09 LAB — LIPASE, BLOOD: Lipase: 44 U/L (ref 11–51)

## 2020-06-09 MED ORDER — LISINOPRIL 40 MG PO TABS: 40.0000 mg | ORAL_TABLET | Freq: Every day | ORAL | 1 refills | Status: DC

## 2020-06-09 MED ORDER — OXYCODONE-ACETAMINOPHEN 5-325 MG PO TABS: 1.0000 | ORAL_TABLET | Freq: Once | ORAL | Status: DC

## 2020-06-09 MED ORDER — OXYCODONE-ACETAMINOPHEN 5-325 MG PO TABS
1.0000 | ORAL_TABLET | Freq: Once | ORAL | Status: AC
Start: 2020-06-09 — End: 2020-06-09
  Administered 2020-06-09: 1 via ORAL
  Filled 2020-06-09: qty 1

## 2020-06-09 MED ORDER — CARVEDILOL 25 MG PO TABS: 25.0000 mg | ORAL_TABLET | Freq: Two times a day (BID) | ORAL | 2 refills | Status: DC

## 2020-06-09 MED ORDER — AMLODIPINE BESYLATE 10 MG PO TABS: 10.0000 mg | ORAL_TABLET | Freq: Every day | ORAL | 0 refills | Status: DC

## 2020-06-09 MED ORDER — AMLODIPINE BESYLATE 5 MG PO TABS
5.0000 mg | ORAL_TABLET | Freq: Once | ORAL | Status: AC
  Administered 2020-06-09: 5 mg via ORAL
  Filled 2020-06-09: qty 1

## 2020-06-09 MED ORDER — IOHEXOL 300 MG/ML  SOLN
100.0000 mL | Freq: Once | INTRAMUSCULAR | Status: AC | PRN
  Administered 2020-06-09: 100 mL via INTRAVENOUS

## 2020-06-09 MED ORDER — LABETALOL HCL 5 MG/ML IV SOLN: 10.0000 mg | Freq: Once | INTRAVENOUS | Status: DC

## 2020-06-09 MED ORDER — FENTANYL CITRATE (PF) 100 MCG/2ML IJ SOLN
50.0000 ug | Freq: Once | INTRAMUSCULAR | Status: AC
  Administered 2020-06-09: 50 ug via INTRAVENOUS
  Filled 2020-06-09: qty 2

## 2020-06-09 MED ORDER — ONDANSETRON HCL 4 MG/2ML IJ SOLN
4.0000 mg | Freq: Once | INTRAMUSCULAR | Status: AC
  Administered 2020-06-09: 4 mg via INTRAVENOUS
  Filled 2020-06-09: qty 2

## 2020-06-09 NOTE — ED Triage Notes (Signed)
Patient BIB [facility] for evaluation of abdominal pain. Patient states last year he had several episodes of pancreatitis but changed it diet and the pain stopped. Patient states he started eating fried foods again and now the pain has returned.

## 2020-06-09 NOTE — ED Provider Notes (Signed)
[facility] Provider Note   CSN: [id number] Arrival date & time: 06/09/20  1741     History Chief Complaint  Patient presents with  . Abdominal Pain    Randy [name]. is a 40 y.o. male.  The history is provided by the patient.  Abdominal Pain Pain location:  Epigastric Pain quality: gnawing   Pain radiates to:  Back Pain severity:  Moderate Onset quality:  Gradual Duration:  1 day Timing:  Constant Progression:  Unchanged Chronicity:  Recurrent Context: diet changes   Relieved by:  Nothing Worsened by:  Nothing Ineffective treatments:  None tried Associated symptoms: nausea   Associated symptoms: no chest pain, no chills, no cough, no dysuria, no fever, no hematuria, no shortness of breath, no sore throat and no vomiting   Risk factors: obesity   Risk factors: no alcohol abuse, no aspirin use, not elderly, has not had multiple surgeries and no NSAID use        Past Medical History:  Diagnosis Date  . HLD (hyperlipidemia)   . Hypertension   . Pancreatitis   . Type II diabetes mellitus [facility])     Patient Active Problem List   Diagnosis Date Noted  . Type 2 diabetes mellitus without complication, with long-term current use of insulin ([place])   . Pancreatitis 04/08/2020  . Hyperlipidemia 06/01/2019  . Microalbuminuria due to type 2 diabetes mellitus ([place]) 06/01/2019  . Essential hypertension 05/31/2019  . Hypertensive urgency 08/31/2015  . Uncontrolled diabetes mellitus ([place]) 08/31/2015  . Marijuana abuse 08/31/2015    Past Surgical History:  Procedure Laterality Date  . NO PAST SURGERIES         Family History  Problem Relation Age of Onset  . Hypertension Mother   . Diabetes Father   . Hypertension Father   . Diabetes Brother   . Diabetes Maternal Grandmother   . Colon cancer Neg Hx   . Esophageal cancer Neg Hx   . Rectal cancer Neg Hx   . Stomach cancer Neg Hx     Social History   Tobacco Use  .  Smoking status: Former Smoker    Packs/day: 0.33    Years: 17.00    Pack years: 5.61    Types: Cigarettes    Quit date: 12/26/2011    Years since quitting: 8.4  . Smokeless tobacco: Never Used  Vaping Use  . Vaping Use: Never used  Substance Use Topics  . Alcohol use: No    Comment: none in6-7 years  . Drug use: Yes    Types: Marijuana    Comment: Nov 2020    Home Medications Prior to Admission medications   Medication Sig Start Date End Date Taking? Authorizing Provider  amLODipine (NORVASC) 10 MG tablet Take 1 tablet (10 mg total) by mouth daily. 04/12/20   Maness, Arnette Norris, MD  atorvastatin (LIPITOR) 80 MG tablet Take 1 tablet (80 mg total) by mouth daily. 04/11/20   Maness, Arnette Norris, MD  Blood Glucose Monitoring Suppl (TRUE METRIX METER) w/Device KIT Use to check blood sugar TID. E11.65 Patient taking differently: 1 each by Other route in the morning, at noon, and at bedtime. Use to check blood sugar TID. E11.65 06/10/19   Charlott Rakes, MD  carvedilol (COREG) 25 MG tablet Take 1 tablet (25 mg total) by mouth 2 (two) times daily with a meal. 03/22/20   Hayden Rasmussen, MD  glipiZIDE (GLUCOTROL) 5 MG tablet Take 1 tablet (5 mg total) by  mouth 2 (two) times daily before a meal. 03/22/20   Hayden Rasmussen, MD  glucose blood test strip Use as instructed to check TID. E11.65 Patient taking differently: 1 each by Other route in the morning, at noon, and at bedtime. Use as instructed to check TID. E11.65 06/10/19   Charlott Rakes, MD  ibuprofen (ADVIL) 200 MG tablet Take 400 mg by mouth every 6 (six) hours as needed (pain).    [provider]  insulin glargine (LANTUS) 100 UNIT/ML Solostar Pen Inject 22 Units into the skin daily. Patient taking differently: Inject 24 Units into the skin daily after breakfast.  09/01/19 05/12/20  Nicolette Bang, DO  Insulin Pen Needle (TRUEPLUS 5-BEVEL PEN NEEDLES) 32G X 4 MM MISC Use to inject insulin daily. Patient taking differently:  1 each by Other route daily. Use to inject insulin daily. 06/10/19   Charlott Rakes, MD  lisinopril (ZESTRIL) 40 MG tablet Take 1 tablet (40 mg total) by mouth daily. 05/22/20   Chase Picket, MD  omeprazole (PRILOSEC) 40 MG capsule Take 1 capsule (40 mg total) by mouth daily. 10/07/19   Mauri Pole, MD  ondansetron (ZOFRAN ODT) 4 MG disintegrating tablet Take 1 tablet (4 mg total) by mouth every 8 (eight) hours as needed for nausea or vomiting. 03/22/20   Hayden Rasmussen, MD  sildenafil (VIAGRA) 100 MG tablet Take 0.5-1 tablets (50-100 mg total) by mouth daily as needed for erectile dysfunction. 11/30/19   Nicolette Bang, DO  TRUEplus Lancets 28G MISC Use to check blood sugar three times daily. E11.65 Patient taking differently: 1 each by Other route in the morning, at noon, and at bedtime. Use to check blood sugar three times daily. E11.65 06/10/19   Charlott Rakes, MD    Allergies    Patient has no known allergies.  Review of Systems   Review of Systems  Constitutional: Negative for chills and fever.  HENT: Negative for ear pain and sore throat.   Eyes: Negative for pain and visual disturbance.  Respiratory: Negative for cough and shortness of breath.   Cardiovascular: Negative for chest pain and palpitations.  Gastrointestinal: Positive for abdominal pain and nausea. Negative for vomiting.  Genitourinary: Negative for dysuria and hematuria.  Musculoskeletal: Negative for arthralgias and back pain.  Skin: Negative for color change and rash.  Neurological: Negative for seizures and syncope.  All other systems reviewed and are negative.   Physical Exam Updated Vital Signs BP (!) 215/130 (BP Location: Left Arm)   Pulse 84   Temp 99 F (37.2 C) (Oral)   Resp 20   Ht '5\' 8"'  (1.727 m)   Wt 86.2 kg   SpO2 97%   BMI 28.89 kg/m   Physical Exam Vitals and nursing note reviewed.  Constitutional:      Appearance: He is well-developed and well-nourished.  HENT:      Head: Normocephalic and atraumatic.  Eyes:     Conjunctiva/sclera: Conjunctivae normal.  Cardiovascular:     Rate and Rhythm: Normal rate and regular rhythm.     Heart sounds: No murmur heard.   Pulmonary:     Effort: Pulmonary effort is normal. No respiratory distress.     Breath sounds: Normal breath sounds.  Abdominal:     Palpations: Abdomen is soft.     Tenderness: There is abdominal tenderness in the epigastric area. There is no right CVA tenderness, left CVA tenderness, guarding or rebound.  Musculoskeletal:  General: No edema.     Cervical back: Neck supple.  Skin:    General: Skin is warm and dry.  Neurological:     Mental Status: He is alert.  Psychiatric:        Mood and Affect: Mood and affect normal.     ED Results / Procedures / Treatments   Labs (all labs ordered are listed, but only abnormal results are displayed) Labs Reviewed  COMPREHENSIVE METABOLIC PANEL - Abnormal; Notable for the following components:      Result Value   Glucose, Bld 179 (*)    Calcium 8.7 (*)    Albumin 2.9 (*)    Alkaline Phosphatase 135 (*)    All other components within normal limits  CBC - Abnormal; Notable for the following components:   RBC 3.78 (*)    Hemoglobin 10.4 (*)    HCT 31.3 (*)    All other components within normal limits  URINALYSIS, ROUTINE W REFLEX MICROSCOPIC - Abnormal; Notable for the following components:   Glucose, UA 150 (*)    Hgb urine dipstick SMALL (*)    Protein, ur >=300 (*)    Leukocytes,Ua TRACE (*)    Bacteria, UA RARE (*)    All other components within normal limits  LIPASE, BLOOD    EKG None  Radiology CT ABDOMEN PELVIS W CONTRAST  Result Date: 06/09/2020 CLINICAL DATA:  Acute abdominal pain, history of pancreatitis EXAM: CT ABDOMEN AND PELVIS WITH CONTRAST TECHNIQUE: Multidetector CT imaging of the abdomen and pelvis was performed using the standard protocol following bolus administration of intravenous contrast. CONTRAST:   124m OMNIPAQUE IOHEXOL 300 MG/ML  SOLN COMPARISON:  04/08/2020 FINDINGS: Lower chest: No acute abnormality. Hepatobiliary: No focal liver abnormality is seen. No gallstones, gallbladder wall thickening, or biliary dilatation. Pancreas: Pancreas is well visualized with very minimal peripancreatic inflammatory change adjacent to the tail. Spleen: Normal in size without focal abnormality. Adrenals/Urinary Tract: Adrenal glands are within normal limits. Kidneys demonstrate a normal enhancement pattern bilaterally. No renal calculi or obstructive changes are seen. Bladder is partially distended. Stomach/Bowel: The appendix is within normal limits. No obstructive or inflammatory changes of the colon are seen. Small bowel and stomach are within normal limits. Vascular/Lymphatic: No significant vascular findings are present. No enlarged abdominal or pelvic lymph nodes. Reproductive: Prostate is unremarkable. Other: No abdominal wall hernia or abnormality. No abdominopelvic ascites. Musculoskeletal: No acute or significant osseous findings. IMPRESSION: Minimal peripancreatic inflammatory changes noted adjacent to the pancreatic tail. This may represent some very early pancreatitis. No phlegmon or pseudocyst is identified. No other focal abnormality is noted. Electronically Signed   By: MInez CatalinaM.D.   On: 06/09/2020 22:20    Procedures Procedures   Medications Ordered in ED Medications  fentaNYL (SUBLIMAZE) injection 50 mcg (has no administration in time range)  ondansetron (ZOFRAN) injection 4 mg (has no administration in time range)  iohexol (OMNIPAQUE) 300 MG/ML solution 100 mL (100 mLs Intravenous Contrast Given 06/09/20 2209)    ED Course  I have reviewed the triage vital signs and the nursing notes.  Pertinent labs & imaging results that were available during my care of the patient were reviewed by me and considered in my medical decision making (see chart for details).    MDM  Rules/Calculators/A&P                          This is a 40year old, male, with a PMHx  of HTN, HLD, Pancreatitis, and type II DM, who presents to the ED for evaluation of epigastric abd pain x 1 day. Pain described as deep and gnawing similar to previous pancreatitis. He has been nauseous, but has not been vomiting or having diarrhea. No fevers. He had been doing well regarding diet, but wife recently made some fried food which he believes caused his pain.   On exam he is afebrile, hypertensive (out of amlodipine and in acute pain), and in no acute distress. He has epigastric tenderness without rebound or guarding. No testicular pain or lower abd pain. No CVA tenderness.   Labs reassuring, CT abd without abdominal emergency, findings c/w possible early pancreatitis. Lipase is WNL. We discussed likely early pancreatitis flair. He does not take NSAIDs or drink alcohol; gastritis seems less likely. No RUQ tenderness or findings of cholecystitis on scan. He is tolerating water in ED.   Pain treated with IV analgesia, zofran for nausea. He tolerated water in ED. Given his home dose of amlodipine for blood pressure. Regarding BP, he denies chest pain, vision changes, headaches. He does have proteinuria, but has had the same levels of proteinuria on previous 4 UA's, do not believe this to be hypertensive emergency, likely elevated from medication noncompliance. BP improving on recheck in ED. We will have him follow pancreatic diet, take his home BP meds (given refills for all medications and stressed importance of taking them), and have him f/u with PCP or return to ED if symptoms not improving.  Final Clinical Impression(s) / ED Diagnoses Final diagnoses:  None    Rx / DC Orders ED Discharge Orders    None       Camila Li, MD 06/10/20 2072    Noemi Chapel, MD 06/11/20 [phone]

## 2020-06-09 NOTE — Discharge Instructions (Signed)
Advance diet slowly, begin with clear liquids and slowly advance to nonfatty foods, recommend fruits and vegetables as tolerated.  Use Tylenol and ibuprofen as needed for pain.  Make sure you take your blood pressure medication as prescribed, today we have refilled your amlodipine (Norvasc), carvedilol (Coreg), and lisinopril (Zestril), all of which are used for your blood pressure.  Monitor your blood pressure at home or in an primary care office, please call your primary care doctor to schedule a follow-up appointment.

## 2020-06-09 NOTE — ED Provider Notes (Signed)
Medical screening examination/treatment/procedure(s) were conducted as a shared visit with non-physician practitioner(s) and myself.  I personally evaluated the patient during the encounter.  Clinical Impression:   Final diagnoses:  Acute pancreatitis, unspecified complication status, unspecified pancreatitis type    I saw and evaluated the patient, reviewed the resident's note and I agree with the findings and plan.  Pertinent History: 40 year old male, history of hypertension, states he takes his medications but is out of his amlodipine for the last couple of weeks.  He also has a history of pancreatitis, states that his wife has been making some "fried foods that are really good", he states that when he eats these he sometimes gets pain and he has had that pain today.   Pertinent Exam findings:  On exam he does have some epigastric tenderness,  No tachycaridia or fever, clear lungs, no edema, normal mentation  He states it radiates to the back and the CAT scan confirms that he does have some mild pancreatitis.  Labs are rather unremarkable, the patient otherwise appears well, will give pain medicine, blood pressure medicine, he will need a refill on his amlodipine, his hypertension is likely related to a combination of both pain and running out of his medicines at home.  These will all be refilled, he is agreeable to the plan, stable for discharge  I was personally present and directly supervised the following procedures:  Medical evaluation  I personally interpreted the EKG as well as the resident and agree with the interpretation on the resident's chart.  Final diagnoses:  Acute pancreatitis, unspecified complication status, unspecified pancreatitis type     Dietary restrictions made clear to the patient and discharge instructions   Eber Hong, MD 06/11/20 1506

## 2020-06-12 ENCOUNTER — Telehealth: Payer: Self-pay

## 2020-06-12 MED FILL — LISINOPRIL 40 MG TABLET: 40 | 30 days supply | Qty: 30 | Fill #0

## 2020-06-12 MED FILL — ?CARVEDILOL 25 MG TABLET: 25 | 30 days supply | Qty: 60 | Fill #0

## 2020-06-12 MED FILL — AMLODIPINE BESYLATE 10 MG T: 10 | 30 days supply | Qty: 30 | Fill #0

## 2020-06-12 NOTE — Telephone Encounter (Signed)
Transition Care Management Unsuccessful Follow-up Telephone Call  Date of discharge and from where:  06/10/2020 from [facility]  Attempts:  1st Attempt  Reason for unsuccessful TCM follow-up call:  Unable to leave message

## 2020-06-13 MED FILL — SILDENAFIL CITRATE 100 MG T: 100 | 15 days supply | Qty: 5 | Fill #3

## 2020-06-13 NOTE — Telephone Encounter (Signed)
Transition Care Management Unsuccessful Follow-up Telephone Call  Date of discharge and from where:  06/10/2020 from [facility]  Attempts:  2nd Attempt  Reason for unsuccessful TCM follow-up call:  Unable to leave message

## 2020-06-14 NOTE — Telephone Encounter (Signed)
Transition Care Management Unsuccessful Follow-up Telephone Call  Date of discharge and from where:  06/10/2020 from [facility]  Attempts:  3rd Attempt  Reason for unsuccessful TCM follow-up call:  Unable to reach patient

## 2020-07-29 ENCOUNTER — Emergency Department (HOSPITAL_COMMUNITY): Admission: EM | Admit: 2020-07-29 | Discharge: 2020-07-29 | Discharge status: Against Medical Advice | Payer: Self-pay

## 2020-07-29 NOTE — ED Notes (Signed)
Pt called 3x no answer  

## 2020-07-31 ENCOUNTER — Telehealth: Payer: Self-pay

## 2020-07-31 NOTE — Telephone Encounter (Signed)
Transition Care Management Unsuccessful Follow-up Telephone Call  Date of discharge and from where:  07/29/2020 from [facility]  Attempts:  1st Attempt  Reason for unsuccessful TCM follow-up call:  No answer/busy   Pt left [facility]

## 2020-08-24 ENCOUNTER — Other Ambulatory Visit: Payer: Self-pay

## 2020-08-24 ENCOUNTER — Other Ambulatory Visit: Payer: Self-pay | Admitting: Internal Medicine

## 2020-08-24 DIAGNOSIS — E119 Type 2 diabetes mellitus without complications: Secondary | ICD-10-CM

## 2020-08-24 MED ORDER — INSULIN GLARGINE 100 UNIT/ML SOLOSTAR PEN
22.0000 [IU] | PEN_INJECTOR | Freq: Every day | SUBCUTANEOUS | 0 refills | Status: DC
  Filled 2020-08-24: qty 6, 27d supply, fill #0

## 2020-08-24 MED FILL — Sildenafil Citrate Tab 100 MG: ORAL | 30 days supply | Qty: 10 | Fill #0 | Status: CN

## 2020-08-24 MED FILL — Atorvastatin Calcium Tab 80 MG (Base Equivalent): ORAL | 30 days supply | Qty: 30 | Fill #0 | Status: CN

## 2020-08-24 MED FILL — Carvedilol Tab 25 MG: ORAL | 30 days supply | Qty: 60 | Fill #0 | Status: CN

## 2020-08-24 MED FILL — Lisinopril Tab 40 MG: ORAL | 30 days supply | Qty: 30 | Fill #0 | Status: CN

## 2020-08-24 MED FILL — Glipizide Tab 5 MG: ORAL | 30 days supply | Qty: 60 | Fill #0 | Status: CN

## 2020-08-24 MED FILL — Amlodipine Besylate Tab 10 MG (Base Equivalent): ORAL | 30 days supply | Qty: 30 | Fill #0 | Status: CN

## 2020-08-24 MED FILL — Atorvastatin Calcium Tab 40 MG (Base Equivalent): ORAL | 30 days supply | Qty: 60 | Fill #0 | Status: CN

## 2020-08-31 ENCOUNTER — Other Ambulatory Visit: Payer: Self-pay

## 2020-09-11 ENCOUNTER — Observation Stay (HOSPITAL_COMMUNITY)
Admission: EM | Admit: 2020-09-11 | Discharge: 2020-09-13 | Disposition: A | Discharge status: Home / Self Care | Payer: Self-pay | Attending: Emergency Medicine | Admitting: Emergency Medicine

## 2020-09-11 DIAGNOSIS — I169 Hypertensive crisis, unspecified: Principal | ICD-10-CM | POA: Diagnosis present

## 2020-09-11 DIAGNOSIS — I161 Hypertensive emergency: Secondary | ICD-10-CM

## 2020-09-11 DIAGNOSIS — I10 Essential (primary) hypertension: Secondary | ICD-10-CM | POA: Diagnosis present

## 2020-09-11 DIAGNOSIS — Z20822 Contact with and (suspected) exposure to covid-19: Secondary | ICD-10-CM | POA: Insufficient documentation

## 2020-09-11 DIAGNOSIS — E119 Type 2 diabetes mellitus without complications: Secondary | ICD-10-CM

## 2020-09-11 DIAGNOSIS — R112 Nausea with vomiting, unspecified: Secondary | ICD-10-CM | POA: Diagnosis present

## 2020-09-11 DIAGNOSIS — E1169 Type 2 diabetes mellitus with other specified complication: Secondary | ICD-10-CM | POA: Diagnosis present

## 2020-09-11 DIAGNOSIS — K219 Gastro-esophageal reflux disease without esophagitis: Secondary | ICD-10-CM | POA: Diagnosis present

## 2020-09-11 DIAGNOSIS — Z87891 Personal history of nicotine dependence: Secondary | ICD-10-CM | POA: Insufficient documentation

## 2020-09-11 DIAGNOSIS — E785 Hyperlipidemia, unspecified: Secondary | ICD-10-CM | POA: Diagnosis present

## 2020-09-11 DIAGNOSIS — E1165 Type 2 diabetes mellitus with hyperglycemia: Secondary | ICD-10-CM

## 2020-09-11 DIAGNOSIS — Z7984 Long term (current) use of oral hypoglycemic drugs: Secondary | ICD-10-CM | POA: Insufficient documentation

## 2020-09-11 DIAGNOSIS — Z794 Long term (current) use of insulin: Secondary | ICD-10-CM | POA: Insufficient documentation

## 2020-09-11 DIAGNOSIS — N179 Acute kidney failure, unspecified: Secondary | ICD-10-CM | POA: Diagnosis present

## 2020-09-11 DIAGNOSIS — R519 Headache, unspecified: Secondary | ICD-10-CM | POA: Insufficient documentation

## 2020-09-11 DIAGNOSIS — Z79899 Other long term (current) drug therapy: Secondary | ICD-10-CM | POA: Insufficient documentation

## 2020-09-11 MED ORDER — HYDROMORPHONE HCL 1 MG/ML IJ SOLN
1.0000 mg | Freq: Once | INTRAMUSCULAR | Status: AC
  Administered 2020-09-12: 1 mg via INTRAVENOUS
  Filled 2020-09-11: qty 1

## 2020-09-11 MED ORDER — SODIUM CHLORIDE 0.9 % IV BOLUS
1000.0000 mL | Freq: Once | INTRAVENOUS | Status: AC
  Administered 2020-09-12: 1000 mL via INTRAVENOUS

## 2020-09-11 MED ORDER — ONDANSETRON HCL 4 MG/2ML IJ SOLN
4.0000 mg | Freq: Once | INTRAMUSCULAR | Status: AC
  Administered 2020-09-12: 4 mg via INTRAVENOUS
  Filled 2020-09-11: qty 2

## 2020-09-11 NOTE — ED Triage Notes (Signed)
Pt bib [facility]. Pt started having abdominal pain, back pain, nausea and feeling sweaty at approximately 1830.  Pt's BP=240/160 per EMS. Pt took home meds of carvedilol and amlodipine at 2030, pt had not had these in past three days. Pt states he is having abdominal, back and chest pain. Pt c/o nausea on arrival. EMS VS: 240/160 HR=82 cbg=253

## 2020-09-11 NOTE — ED Provider Notes (Signed)
MSE was initiated and I personally evaluated the patient and placed orders (if any) at  11:58 PM on Sep 11, 2020.   Patient expedited to acute due to acuity of condition.   Chest/epigastric pain that radiates into his back.  Had numbness and tingling earlier.  BP significantly elevated.  States this feels unlike his pancreatitis pains in the past.  CT dissection study ordered.       Roxy Horseman, PA-C 09/12/20 0000    Glynn Octave, MD 09/12/20 [phone]

## 2020-09-12 ENCOUNTER — Encounter (HOSPITAL_COMMUNITY): Payer: Self-pay

## 2020-09-12 ENCOUNTER — Emergency Department (HOSPITAL_COMMUNITY): Payer: Self-pay

## 2020-09-12 ENCOUNTER — Other Ambulatory Visit: Payer: Self-pay

## 2020-09-12 DIAGNOSIS — I10 Essential (primary) hypertension: Secondary | ICD-10-CM

## 2020-09-12 DIAGNOSIS — Z794 Long term (current) use of insulin: Secondary | ICD-10-CM

## 2020-09-12 DIAGNOSIS — E119 Type 2 diabetes mellitus without complications: Secondary | ICD-10-CM

## 2020-09-12 DIAGNOSIS — K219 Gastro-esophageal reflux disease without esophagitis: Secondary | ICD-10-CM | POA: Diagnosis present

## 2020-09-12 DIAGNOSIS — I169 Hypertensive crisis, unspecified: Secondary | ICD-10-CM | POA: Diagnosis present

## 2020-09-12 DIAGNOSIS — R112 Nausea with vomiting, unspecified: Secondary | ICD-10-CM | POA: Diagnosis present

## 2020-09-12 DIAGNOSIS — N179 Acute kidney failure, unspecified: Secondary | ICD-10-CM

## 2020-09-12 DIAGNOSIS — E785 Hyperlipidemia, unspecified: Secondary | ICD-10-CM

## 2020-09-12 LAB — COMPREHENSIVE METABOLIC PANEL
ALT: 17 U/L (ref 0–44)
AST: 24 U/L (ref 15–41)
Albumin: 2.8 g/dL — ABNORMAL LOW (ref 3.5–5.0)
Alkaline Phosphatase: 92 U/L (ref 38–126)
Anion gap: 6 (ref 5–15)
BUN: 19 mg/dL (ref 6–20)
CO2: 26 mmol/L (ref 22–32)
Calcium: 8.8 mg/dL — ABNORMAL LOW (ref 8.9–10.3)
Chloride: 101 mmol/L (ref 98–111)
Creatinine, Ser: 1.48 mg/dL — ABNORMAL HIGH (ref 0.61–1.24)
GFR, Estimated: >60 mL/min (ref >60)
Glucose, Bld: 228 mg/dL — ABNORMAL HIGH (ref 70–99)
Potassium: 4.4 mmol/L (ref 3.5–5.1)
Sodium: 133 mmol/L — ABNORMAL LOW (ref 135–145)
Total Bilirubin: 0.8 mg/dL (ref 0.3–1.2)
Total Protein: 6.8 g/dL (ref 6.5–8.1)

## 2020-09-12 LAB — CBG MONITORING, ED
Glucose-Capillary: 231 mg/dL — ABNORMAL HIGH (ref 70–99)
Glucose-Capillary: 215 mg/dL — ABNORMAL HIGH (ref 70–99)
Glucose-Capillary: 182 mg/dL — ABNORMAL HIGH (ref 70–99)

## 2020-09-12 LAB — URINALYSIS, ROUTINE W REFLEX MICROSCOPIC
Bilirubin Urine: NEGATIVE
Glucose, UA: 150 mg/dL — AB
Ketones, ur: NEGATIVE mg/dL
Leukocytes,Ua: NEGATIVE
Nitrite: NEGATIVE
Protein, ur: >=300 mg/dL — AB
Specific Gravity, Urine: 1.015 (ref 1.005–1.030)
pH: 6 (ref 5.0–8.0)

## 2020-09-12 LAB — URINALYSIS, COMPLETE (UACMP) WITH MICROSCOPIC
Bacteria, UA: NONE SEEN
Bilirubin Urine: NEGATIVE
Glucose, UA: 150 mg/dL — AB
Ketones, ur: NEGATIVE mg/dL
Leukocytes,Ua: NEGATIVE
Nitrite: NEGATIVE
Protein, ur: 100 mg/dL — AB
Specific Gravity, Urine: 1.023 (ref 1.005–1.030)
pH: 6 (ref 5.0–8.0)

## 2020-09-12 LAB — CBC WITH DIFFERENTIAL/PLATELET
Abs Immature Granulocytes: 0.02 10*3/uL (ref 0.00–0.07)
Basophils Absolute: 0.1 10*3/uL (ref 0.0–0.1)
Basophils Relative: 1 %
Eosinophils Absolute: 0.1 10*3/uL (ref 0.0–0.5)
Eosinophils Relative: 1 %
HCT: 31.8 % — ABNORMAL LOW (ref 39.0–52.0)
Hemoglobin: 10.3 g/dL — ABNORMAL LOW (ref 13.0–17.0)
Immature Granulocytes: 0 %
Lymphocytes Relative: 31 %
Lymphs Abs: 2.6 10*3/uL (ref 0.7–4.0)
MCH: 27 pg (ref 26.0–34.0)
MCHC: 32.4 g/dL (ref 30.0–36.0)
MCV: 83.5 fL (ref 80.0–100.0)
Monocytes Absolute: 0.4 10*3/uL (ref 0.1–1.0)
Monocytes Relative: 5 %
Neutro Abs: 5.1 10*3/uL (ref 1.7–7.7)
Neutrophils Relative %: 62 %
Platelets: 277 10*3/uL (ref 150–400)
RBC: 3.81 MIL/uL — ABNORMAL LOW (ref 4.22–5.81)
RDW: 12.5 % (ref 11.5–15.5)
WBC: 8.3 10*3/uL (ref 4.0–10.5)
nRBC: 0 % (ref 0.0–0.2)

## 2020-09-12 LAB — RAPID URINE DRUG SCREEN, HOSP PERFORMED
Amphetamines: NOT DETECTED
Barbiturates: NOT DETECTED
Benzodiazepines: NOT DETECTED
Cocaine: NOT DETECTED
Opiates: NOT DETECTED
Tetrahydrocannabinol: POSITIVE — AB

## 2020-09-12 LAB — GLUCOSE, CAPILLARY
Glucose-Capillary: 232 mg/dL — ABNORMAL HIGH (ref 70–99)
Glucose-Capillary: 157 mg/dL — ABNORMAL HIGH (ref 70–99)

## 2020-09-12 LAB — BASIC METABOLIC PANEL
Anion gap: 8 (ref 5–15)
BUN: 14 mg/dL (ref 6–20)
CO2: 25 mmol/L (ref 22–32)
Calcium: 8.6 mg/dL — ABNORMAL LOW (ref 8.9–10.3)
Chloride: 101 mmol/L (ref 98–111)
Creatinine, Ser: 1.25 mg/dL — ABNORMAL HIGH (ref 0.61–1.24)
GFR, Estimated: >60 mL/min (ref >60)
Glucose, Bld: 218 mg/dL — ABNORMAL HIGH (ref 70–99)
Potassium: 4.4 mmol/L (ref 3.5–5.1)
Sodium: 134 mmol/L — ABNORMAL LOW (ref 135–145)

## 2020-09-12 LAB — MAGNESIUM: Magnesium: 2 mg/dL (ref 1.7–2.4)

## 2020-09-12 LAB — TROPONIN I (HIGH SENSITIVITY)
Troponin I (High Sensitivity): 25 ng/L — ABNORMAL HIGH (ref <18)
Troponin I (High Sensitivity): 23 ng/L — ABNORMAL HIGH (ref <18)

## 2020-09-12 LAB — TSH: TSH: 1.085 u[IU]/mL (ref 0.350–4.500)

## 2020-09-12 LAB — LIPASE, BLOOD: Lipase: 73 U/L — ABNORMAL HIGH (ref 11–51)

## 2020-09-12 LAB — CBC
HCT: 32.5 % — ABNORMAL LOW (ref 39.0–52.0)
Hemoglobin: 10.4 g/dL — ABNORMAL LOW (ref 13.0–17.0)
MCH: 26.9 pg (ref 26.0–34.0)
MCHC: 32 g/dL (ref 30.0–36.0)
MCV: 84 fL (ref 80.0–100.0)
Platelets: 310 10*3/uL (ref 150–400)
RBC: 3.87 MIL/uL — ABNORMAL LOW (ref 4.22–5.81)
RDW: 12.7 % (ref 11.5–15.5)
WBC: 8.9 10*3/uL (ref 4.0–10.5)
nRBC: 0 % (ref 0.0–0.2)

## 2020-09-12 LAB — RESP PANEL BY RT-PCR (FLU A&B, COVID) ARPGX2
Influenza A by PCR: NEGATIVE
Influenza B by PCR: NEGATIVE
SARS Coronavirus 2 by RT PCR: NEGATIVE

## 2020-09-12 LAB — CREATININE, URINE, RANDOM: Creatinine, Urine: 26.93 mg/dL

## 2020-09-12 LAB — SODIUM, URINE, RANDOM: Sodium, Ur: 86 mmol/L

## 2020-09-12 LAB — HIV ANTIBODY (ROUTINE TESTING W REFLEX): HIV Screen 4th Generation wRfx: NONREACTIVE

## 2020-09-12 LAB — MRSA PCR SCREENING: MRSA by PCR: NEGATIVE

## 2020-09-12 MED ORDER — NICARDIPINE HCL IN NACL 20-0.86 MG/200ML-% IV SOLN
3.0000 mg/h | INTRAVENOUS | Status: DC
  Administered 2020-09-12: 3 mg/h via INTRAVENOUS
  Filled 2020-09-12 (×3): qty 200

## 2020-09-12 MED ORDER — ACETAMINOPHEN 650 MG RE SUPP: 650.0000 mg | Freq: Four times a day (QID) | RECTAL | Status: DC | PRN

## 2020-09-12 MED ORDER — LACTATED RINGERS IV SOLN: INTRAVENOUS | Status: DC

## 2020-09-12 MED ORDER — TRAMADOL HCL 50 MG PO TABS
50.0000 mg | ORAL_TABLET | Freq: Two times a day (BID) | ORAL | Status: DC
  Administered 2020-09-12 (×2): 50 mg via ORAL
  Filled 2020-09-12 (×2): qty 1

## 2020-09-12 MED ORDER — ACETAMINOPHEN 325 MG PO TABS: 650.0000 mg | ORAL_TABLET | Freq: Four times a day (QID) | ORAL | Status: DC | PRN

## 2020-09-12 MED ORDER — INSULIN GLARGINE 100 UNIT/ML ~~LOC~~ SOLN
6.0000 [IU] | Freq: Every day | SUBCUTANEOUS | Status: DC
  Administered 2020-09-12 – 2020-09-13 (×2): 6 [IU] via SUBCUTANEOUS
  Filled 2020-09-12 (×3): qty 0.06

## 2020-09-12 MED ORDER — ATORVASTATIN CALCIUM 80 MG PO TABS
80.0000 mg | ORAL_TABLET | Freq: Every day | ORAL | Status: DC
  Administered 2020-09-12 – 2020-09-13 (×2): 80 mg via ORAL
  Filled 2020-09-12: qty 1
  Filled 2020-09-12: qty 2

## 2020-09-12 MED ORDER — LORAZEPAM 2 MG/ML IJ SOLN
0.5000 mg | Freq: Four times a day (QID) | INTRAMUSCULAR | Status: DC | PRN
  Administered 2020-09-12: 0.5 mg via INTRAVENOUS
  Filled 2020-09-12: qty 1

## 2020-09-12 MED ORDER — IOHEXOL 350 MG/ML SOLN
100.0000 mL | Freq: Once | INTRAVENOUS | Status: AC | PRN
  Administered 2020-09-12: 100 mL via INTRAVENOUS

## 2020-09-12 MED ORDER — PANTOPRAZOLE SODIUM 40 MG PO TBEC
40.0000 mg | DELAYED_RELEASE_TABLET | Freq: Every day | ORAL | Status: DC
  Administered 2020-09-12 – 2020-09-13 (×2): 40 mg via ORAL
  Filled 2020-09-12 (×2): qty 1

## 2020-09-12 MED ORDER — ONDANSETRON HCL 4 MG/2ML IJ SOLN
4.0000 mg | Freq: Four times a day (QID) | INTRAMUSCULAR | Status: DC | PRN
  Administered 2020-09-12 (×2): 4 mg via INTRAVENOUS
  Filled 2020-09-12 (×2): qty 2

## 2020-09-12 MED ORDER — AMLODIPINE BESYLATE 10 MG PO TABS
10.0000 mg | ORAL_TABLET | Freq: Every day | ORAL | Status: DC
  Administered 2020-09-12 – 2020-09-13 (×3): 10 mg via ORAL
  Filled 2020-09-12: qty 1
  Filled 2020-09-12: qty 2
  Filled 2020-09-12: qty 1

## 2020-09-12 MED ORDER — INSULIN ASPART 100 UNIT/ML IJ SOLN
0.0000 [IU] | Freq: Three times a day (TID) | INTRAMUSCULAR | Status: DC
  Administered 2020-09-12: 3 [IU] via SUBCUTANEOUS
  Administered 2020-09-12: 2 [IU] via SUBCUTANEOUS
  Administered 2020-09-12: 3 [IU] via SUBCUTANEOUS
  Administered 2020-09-13: 2 [IU] via SUBCUTANEOUS
  Administered 2020-09-13: 1 [IU] via SUBCUTANEOUS

## 2020-09-12 MED ORDER — MORPHINE SULFATE (PF) 2 MG/ML IV SOLN
1.0000 mg | INTRAVENOUS | Status: DC | PRN
  Administered 2020-09-12 – 2020-09-13 (×4): 1 mg via INTRAVENOUS
  Filled 2020-09-12 (×4): qty 1

## 2020-09-12 MED ORDER — CARVEDILOL 25 MG PO TABS
25.0000 mg | ORAL_TABLET | Freq: Two times a day (BID) | ORAL | Status: DC
  Administered 2020-09-12 – 2020-09-13 (×3): 25 mg via ORAL
  Filled 2020-09-12 (×2): qty 1
  Filled 2020-09-12: qty 2

## 2020-09-12 MED ORDER — HYDROMORPHONE HCL 1 MG/ML IJ SOLN: 0.5000 mg | INTRAMUSCULAR | Status: DC | PRN

## 2020-09-12 NOTE — ED Provider Notes (Signed)
[facility] EMERGENCY DEPARTMENT Provider Note   CSN: [id number] Arrival date & time: 09/11/20  2350     History Chief Complaint  Patient presents with  . Chest Pain    Randy [name]. is a 40 y.o. male.  HPI   Pt is a 40 y/o male with a h/o HLD, HTN, pancreatitis, DM, who presents to the ED today for eval of abd pain and back pain that started today. He further reports some left sided chest pain. He states that his pain is currently improved but he has had similar sxs in that past and was dx with pancreatitis.   Reports associated diaphoresis, headache, nausea, vomiting. Denies lightheadedness, dizziness, numbness, weakness.   Past Medical History:  Diagnosis Date  . HLD (hyperlipidemia)   . Hypertension   . Pancreatitis   . Type II diabetes mellitus [facility])     Patient Active Problem List   Diagnosis Date Noted  . Type 2 diabetes mellitus without complication, with long-term current use of insulin ([place])   . Pancreatitis 04/08/2020  . Hyperlipidemia 06/01/2019  . Microalbuminuria due to type 2 diabetes mellitus ([place]) 06/01/2019  . Essential hypertension 05/31/2019  . Hypertensive urgency 08/31/2015  . Uncontrolled diabetes mellitus ([place]) 08/31/2015  . Marijuana abuse 08/31/2015    Past Surgical History:  Procedure Laterality Date  . NO PAST SURGERIES         Family History  Problem Relation Age of Onset  . Hypertension Mother   . Diabetes Father   . Hypertension Father   . Diabetes Brother   . Diabetes Maternal Grandmother   . Colon cancer Neg Hx   . Esophageal cancer Neg Hx   . Rectal cancer Neg Hx   . Stomach cancer Neg Hx     Social History   Tobacco Use  . Smoking status: Former Smoker    Packs/day: 0.33    Years: 17.00    Pack years: 5.61    Types: Cigarettes    Quit date: 12/26/2011    Years since quitting: 8.7  . Smokeless tobacco: Never Used  Vaping Use  . Vaping Use: Never used  Substance Use Topics  . Alcohol  use: No    Comment: none in6-7 years  . Drug use: Yes    Types: Marijuana    Comment: Nov 2020    Home Medications Prior to Admission medications   Medication Sig Start Date End Date Taking? Authorizing Provider  amLODipine (NORVASC) 10 MG tablet TAKE 1 TABLET (10 MG TOTAL) BY MOUTH DAILY. Patient taking differently: Take 10 mg by mouth daily. 06/09/20 06/09/21  Camila Li, MD  atorvastatin (LIPITOR) 40 MG tablet TAKE 2 TABLETS (80 MG) BY MOUTH DAILY. Patient taking differently: Take 80 mg by mouth daily. 04/11/20 04/11/21  Maness, Arnette Norris, MD  Blood Glucose Monitoring Suppl (TRUE METRIX METER) w/Device KIT Use to check blood sugar TID. E11.65 Patient taking differently: 1 each by Other route in the morning, at noon, and at bedtime. Use to check blood sugar TID. E11.65 06/10/19   Charlott Rakes, MD  carvedilol (COREG) 25 MG tablet TAKE 1 TABLET (25 MG TOTAL) BY MOUTH 2 (TWO) TIMES DAILY WITH A MEAL. Patient taking differently: Take 25 mg by mouth 2 (two) times daily with a meal. 06/09/20 06/09/21  Camila Li, MD  glipiZIDE (GLUCOTROL) 5 MG tablet TAKE 1 TABLET (5 MG TOTAL) BY MOUTH 2 (TWO) TIMES DAILY BEFORE A MEAL. Patient taking differently: Take 5 mg by mouth 2 (  two) times daily before a meal. 03/22/20 03/22/21  Hayden Rasmussen, MD  glucose blood test strip Use as instructed to check TID. E11.65 Patient taking differently: 1 each by Other route in the morning, at noon, and at bedtime. Use as instructed to check TID. E11.65 06/10/19   Charlott Rakes, MD  ibuprofen (ADVIL) 200 MG tablet Take 400 mg by mouth every 6 (six) hours as needed (pain).    [provider]  insulin glargine (LANTUS) 100 UNIT/ML Solostar Pen Inject 22 Units into the skin daily. 08/24/20 10/31/20  Nicolette Bang, DO  Insulin Pen Needle (TRUEPLUS 5-BEVEL PEN NEEDLES) 32G X 4 MM MISC Use to inject insulin daily. Patient taking differently: 1 each by Other route daily. Use to inject insulin  daily. 06/10/19   Charlott Rakes, MD  lisinopril (ZESTRIL) 40 MG tablet TAKE 1 TABLET (40 MG TOTAL) BY MOUTH DAILY. Patient taking differently: Take 40 mg by mouth daily. 06/09/20 06/09/21  Camila Li, MD  omeprazole (PRILOSEC) 40 MG capsule Take 1 capsule (40 mg total) by mouth daily. 10/07/19   Mauri Pole, MD  ondansetron (ZOFRAN ODT) 4 MG disintegrating tablet Take 1 tablet (4 mg total) by mouth every 8 (eight) hours as needed for nausea or vomiting. 03/22/20   Hayden Rasmussen, MD  ondansetron (ZOFRAN) 4 MG tablet TAKE 1 TABLET (4 MG TOTAL) BY MOUTH EVERY 8 (EIGHT) HOURS AS NEEDED FOR NAUSEA OR VOMITING. Patient taking differently: Take 4 mg by mouth every 8 (eight) hours as needed for nausea or vomiting. 03/22/20 03/22/21  Hayden Rasmussen, MD  sildenafil (VIAGRA) 100 MG tablet TAKE 0.5-1 TABLETS (50-100 MG TOTAL) BY MOUTH DAILY AS NEEDED FOR ERECTILE DYSFUNCTION. Patient taking differently: Take 50-100 mg by mouth as needed for erectile dysfunction. 11/30/19 11/29/20  Nicolette Bang, DO  TRUEplus Lancets 28G MISC Use to check blood sugar three times daily. E11.65 Patient taking differently: 1 each by Other route in the morning, at noon, and at bedtime. Use to check blood sugar three times daily. E11.65 06/10/19   Charlott Rakes, MD    Allergies    Patient has no known allergies.  Review of Systems   Review of Systems  Constitutional: Negative for fever.  HENT: Negative for ear pain and sore throat.   Eyes: Negative for pain.  Respiratory: Negative for cough and shortness of breath.   Cardiovascular: Positive for chest pain.  Gastrointestinal: Positive for abdominal pain, nausea and vomiting. Negative for constipation and diarrhea.  Genitourinary: Negative for dysuria and hematuria.  Musculoskeletal: Positive for back pain.  Skin: Negative for rash.  Neurological: Positive for headaches. Negative for dizziness, weakness, light-headedness and numbness.  All  other systems reviewed and are negative.   Physical Exam Updated Vital Signs BP (!) 188/108   Pulse 89   Temp 98.7 F (37.1 C) (Oral)   Resp 12   Ht '5\' 8"'  (1.727 m)   Wt 86.2 kg   SpO2 99%   BMI 28.89 kg/m   Physical Exam Vitals and nursing note reviewed.  Constitutional:      Appearance: He is well-developed.  HENT:     Head: Normocephalic and atraumatic.  Eyes:     Conjunctiva/sclera: Conjunctivae normal.  Cardiovascular:     Rate and Rhythm: Normal rate and regular rhythm.     Heart sounds: Normal heart sounds. No murmur heard.   Pulmonary:     Effort: Pulmonary effort is normal. No respiratory distress.     Breath  sounds: Normal breath sounds. No decreased breath sounds, wheezing, rhonchi or rales.  Abdominal:     Palpations: Abdomen is soft.     Tenderness: There is no abdominal tenderness. There is no guarding or rebound.  Musculoskeletal:     Cervical back: Neck supple.     Right lower leg: No tenderness. No edema.     Left lower leg: No tenderness. No edema.  Skin:    General: Skin is warm and dry.  Neurological:     Mental Status: He is alert.     Comments: Mental Status:  Alert, thought content appropriate, able to give a coherent history. Speech fluent without evidence of aphasia. Able to follow 2 step commands without difficulty.  Cranial Nerves:  II:   pupils equal, round, reactive to light III,IV, VI: ptosis not present, extra-ocular motions intact bilaterally  V,VII: smile symmetric, facial light touch sensation equal VIII: hearing grossly normal to voice  X: uvula elevates symmetrically  XI: bilateral shoulder shrug symmetric and strong XII: midline tongue extension without fassiculations Motor:  Normal tone. 5/5 strength of BUE and BLE major muscle groups including strong and equal grip strength and dorsiflexion/plantar flexion Sensory: light touch normal in all extremities.      ED Results / Procedures / Treatments   Labs (all labs  ordered are listed, but only abnormal results are displayed) Labs Reviewed  CBC WITH DIFFERENTIAL/PLATELET - Abnormal; Notable for the following components:      Result Value   RBC 3.81 (*)    Hemoglobin 10.3 (*)    HCT 31.8 (*)    All other components within normal limits  COMPREHENSIVE METABOLIC PANEL - Abnormal; Notable for the following components:   Sodium 133 (*)    Glucose, Bld 228 (*)    Creatinine, Ser 1.48 (*)    Calcium 8.8 (*)    Albumin 2.8 (*)    All other components within normal limits  LIPASE, BLOOD - Abnormal; Notable for the following components:   Lipase 73 (*)    All other components within normal limits  URINALYSIS, ROUTINE W REFLEX MICROSCOPIC - Abnormal; Notable for the following components:   APPearance HAZY (*)    Glucose, UA 150 (*)    Hgb urine dipstick SMALL (*)    Protein, ur >=300 (*)    Bacteria, UA RARE (*)    All other components within normal limits  TROPONIN I (HIGH SENSITIVITY) - Abnormal; Notable for the following components:   Troponin I (High Sensitivity) 25 (*)    All other components within normal limits  RESP PANEL BY RT-PCR (FLU A&B, COVID) ARPGX2  TROPONIN I (HIGH SENSITIVITY)    EKG EKG Interpretation  Date/Time:  Monday Sep 11 2020 23:55:18 EDT Ventricular Rate:  79 PR Interval:  176 QRS Duration: 78 QT Interval:  382 QTC Calculation: 438 R Axis:   53 Text Interpretation: Normal sinus rhythm Confirmed by Randal Buba, April ([id number]) on 09/12/2020 12:49:12 AM   Radiology CT Head Wo Contrast  Result Date: 09/12/2020 CLINICAL DATA:  Headache EXAM: CT HEAD WITHOUT CONTRAST TECHNIQUE: Contiguous axial images were obtained from the base of the skull through the vertex without intravenous contrast. COMPARISON:  08/27/2019 FINDINGS: Brain: There is no mass, hemorrhage or extra-axial collection. The size and configuration of the ventricles and extra-axial CSF spaces are normal. The brain parenchyma is normal, without acute or chronic  infarction. Vascular: No abnormal hyperdensity of the major intracranial arteries or dural venous sinuses. No intracranial atherosclerosis. Skull: The  visualized skull base, calvarium and extracranial soft tissues are normal. Sinuses/Orbits: No fluid levels or advanced mucosal thickening of the visualized paranasal sinuses. No mastoid or middle ear effusion. The orbits are normal. IMPRESSION: Normal head CT. Electronically Signed   By: Ulyses Jarred M.D.   On: 09/12/2020 01:45   CT Angio Chest/Abd/Pel for Dissection W and/or Wo Contrast  Result Date: 09/12/2020 CLINICAL DATA:  Chest pain and back pain. EXAM: CT ANGIOGRAPHY CHEST, ABDOMEN AND PELVIS TECHNIQUE: Non-contrast CT of the chest was initially obtained. Multidetector CT imaging through the chest, abdomen and pelvis was performed using the standard protocol during bolus administration of intravenous contrast. Multiplanar reconstructed images and MIPs were obtained and reviewed to evaluate the vascular anatomy. CONTRAST:  157m OMNIPAQUE IOHEXOL 350 MG/ML SOLN COMPARISON:  June 09, 2020 FINDINGS: CTA CHEST FINDINGS Cardiovascular: The thoracic aorta is normal in appearance without evidence of aneurysmal dilatation or dissection. Satisfactory opacification of the pulmonary arteries to the segmental level. No evidence of pulmonary embolism. Normal heart size. No pericardial effusion. Mediastinum/Nodes: No enlarged mediastinal, hilar, or axillary lymph nodes. Thyroid gland, trachea, and esophagus demonstrate no significant findings. Lungs/Pleura: Very mild atelectasis is seen within the posterior aspect of the bilateral lung bases. There is no evidence of acute infiltrate, pleural effusion or pneumothorax. Musculoskeletal: No chest wall abnormality. No acute or significant osseous findings. Review of the MIP images confirms the above findings. CTA ABDOMEN AND PELVIS FINDINGS VASCULAR Aorta: Normal caliber aorta without aneurysm, dissection, vasculitis or  significant stenosis. Celiac: Patent without evidence of aneurysm, dissection, vasculitis or significant stenosis. SMA: Patent without evidence of aneurysm, dissection, vasculitis or significant stenosis. Renals: Both renal arteries are patent without evidence of aneurysm, dissection, vasculitis, fibromuscular dysplasia or significant stenosis. IMA: Patent without evidence of aneurysm, dissection, vasculitis or significant stenosis. Inflow: Patent without evidence of aneurysm, dissection, vasculitis or significant stenosis. Veins: No obvious venous abnormality within the limitations of this arterial phase study. Review of the MIP images confirms the above findings. NON-VASCULAR Hepatobiliary: No focal liver abnormality is seen. No gallstones, gallbladder wall thickening, or biliary dilatation. Pancreas: Unremarkable. No pancreatic ductal dilatation or surrounding inflammatory changes. Spleen: Normal in size without focal abnormality. Adrenals/Urinary Tract: Adrenal glands are unremarkable. Kidneys are normal, without renal calculi, focal lesion, or hydronephrosis. Bladder is unremarkable. Stomach/Bowel: There is a small hiatal hernia. Appendix appears normal. No evidence of bowel wall thickening, distention, or inflammatory changes. Lymphatic: No abnormal abdominal or pelvic lymph nodes are identified. Reproductive: Prostate is unremarkable. Other: No abdominal wall hernia or abnormality. No abdominopelvic ascites. Musculoskeletal: No acute or significant osseous findings. Review of the MIP images confirms the above findings. IMPRESSION: 1. No evidence of acute or active process within the chest, abdomen or pelvis. 2. No evidence of aneurysmal dilatation or dissection of the thoracic aorta, abdominal aorta or arterial structures within the abdomen or pelvis. Electronically Signed   By: TVirgina NorfolkM.D.   On: 09/12/2020 03:26    Procedures Procedures   CRITICAL CARE Performed by: CRodney Booze  Total critical care time: 45 minutes  Critical care time was exclusive of separately billable procedures and treating other patients.  Critical care was necessary to treat or prevent imminent or life-threatening deterioration.  Critical care was time spent personally by me on the following activities: development of treatment plan with patient and/or surrogate as well as nursing, discussions with consultants, evaluation of patient's response to treatment, examination of patient, obtaining history from patient or surrogate, ordering  and performing treatments and interventions, ordering and review of laboratory studies, ordering and review of radiographic studies, pulse oximetry and re-evaluation of patient's condition.   Medications Ordered in ED Medications  nicardipine (CARDENE) 69m in 0.86% saline 2084mIV infusion (0.1 mg/ml) (5 mg/hr Intravenous Rate/Dose Change 09/12/20 0222)  sodium chloride 0.9 % bolus 1,000 mL (0 mLs Intravenous Stopped 09/12/20 0341)  ondansetron (ZOFRAN) injection 4 mg (4 mg Intravenous Given 09/12/20 0138)  HYDROmorphone (DILAUDID) injection 1 mg (1 mg Intravenous Given 09/12/20 0139)  iohexol (OMNIPAQUE) 350 MG/ML injection 100 mL (100 mLs Intravenous Contrast Given 09/12/20 0258)    ED Course  I have reviewed the triage vital signs and the nursing notes.  Pertinent labs & imaging results that were available during my care of the patient were reviewed by me and considered in my medical decision making (see chart for details).    MDM Rules/Calculators/A&P                          [age]/o M for eval of chest pain, found to be significantly hypertensive  Reviewed/interpreted labs  CBC with mild anemia CMP with elevated cr, lfts wnl Lipase marginally elevated Trop marginally elevated, delta trop pending on admission ua with glucosuria and proteinuria  EKG nonischemic  CTA dissection - neg  CT head - neg  Pt with significant htn with elevated trop.  Started on cardene drip and has had some improvement of bp and sxs. Will admit for htn emergency.   3:50 AM Discussed case with Dr. HoVelia Meyerho accepts patient for admission    Final Clinical Impression(s) / ED Diagnoses Final diagnoses:  Hypertensive emergency    Rx / DC Orders ED Discharge Orders    None       CoBishop Dublin5/03/22 0354    Palumbo, April, MD 09/12/20 [phone]

## 2020-09-12 NOTE — ED Notes (Addendum)
CBG resulted in 215 mg/dL. RN notified.

## 2020-09-12 NOTE — H&P (Signed)
History and Physical    PLEASE NOTE THAT DRAGON DICTATION SOFTWARE WAS USED IN THE CONSTRUCTION OF THIS NOTE.   [place]. OZD:[id number] DOB: March 18, 1981 DOA: 09/11/2020  PCP: Nicolette Bang, DO Patient coming from: home   I have personally briefly reviewed patient's old medical records in [place]  Chief Complaint: Nausea vomiting  HPI: Saahil Herbster. is a 40 y.o. male with medical history significant for hypertension, type 2 diabetes mellitus, hyperlipidemia, GERD who is admitted to [facility] on 09/11/2020 with hypertensive crisis after presenting from home to [facility] ED complaining of nausea and vomiting.   The patient reports 1 day of intermittent nausea resulting in 3-4 episodes of nonbloody, nonbilious emesis.  Denies any associated abdominal discomfort, diarrhea, melena, or hematochezia.  No recent trauma or travel.  Not associate with any subjective fever, chills, rigors, or generalized myalgias.  He also denies any associated neck stiffness, rhinitis, rhinorrhea, sore throat, wheezing, cough.  No recent traveling or known COVID-19 exposures.  No acute urinary symptoms, including no dysuria, gross hematuria, or change in urinary urgency/frequency.  He reported a 2 to 3-second episode of sharp left-sided chest discomfort that occurred while he was in the process of vomiting.  This was not associate with any radiation, and was self-limited, with complete resolution within a few seconds of development, and no subsequent return of the symptoms.  This was not associated with any shortness of breath, palpitations, diaphoresis.  He was at rest at the time of experiencing this discomfort, therefore unable to evaluate if there was any exertional component.  He has been unable to reproduce his transient episode of chest discomfort and notes no positional exacerbating factor.  Denies any recent hemoptysis, calf tenderness, lower extremity erythema.  No personal or  family history of acute DVT or PE.   He acknowledges intermittent symmetrical frontal headache over the last day, but denies any associated acute focal weakness, acute focal paresthesias, numbness, vertigo, acute change in vision, dysarthria, facial droop, or dysphagia.   He confirms a history of hypertension, for which she is prescribed Coreg, Norvasc, and lisinopril.  However he conveys that he completely ran out of all of these medications approximately 2 weeks ago, and has been unable to procure refills in the interval.  While off of these antihypertensive medications over the last 2 weeks, he has not been checking his blood pressure, but notes that he has previously experienced the above constellation of symptoms when his blood pressure is running high.  Reports occasionally smoking marijuana, but otherwise denies any use of recreational drugs, including the use of cocaine or methamphetamine.  No recent trauma or known history of aneurysm.    ED Course:  Vital signs in the ED were notable for the following: Initial blood pressure noted to be 222/126, with interval improvement to 188/108 following initiation of nicardipine drip; additional vital signs in the ED notable for temperature max 98.7, heart rate 75-89, respiratory rate 17 and oxygen saturation 9900% on room air.  Labs were notable for the following: CMP notable for sodium 133, potassium 4.4, bicarbonate 26, anion gap 6, creatinine 1.48 relative to most recent prior value of 1.15 on 06/09/2020, glucose 228.  High-sensitivity troponin I x1 found to be 25.  Urinalysis notable for 6-10 white blood cells, nitrate negative, leukocyte esterase negative, about positive hyaline casts, greater than 300 protein.  Nasopharyngeal COVID-19 PCR was checked in the ED this evening, with result currently pending.  Presenting EKG  by way of comparison to most recent prior performed in May 2021 showed normal sinus rhythm heart rate 79, normal intervals, and no  evidence of T wave or ST changes.  Noncontrast CT that showed no evidence of acute intracranial process.  CTA of the chest abdomen pelvis showed no evidence of acute process, including no evidence of aneurysm, or dissection of the thoracic aorta or abdominal aorta.   While in the ED, the following were administered: In addition to initiation of nicardipine drip, patient will receive Dilaudid 1 mg IV x1, Zofran 4 mg IV x1 and a 1 L normal saline bolus.  Subsequently, he was admitted to stepdown unit for further evaluation and management of presenting hypertensive crisis, including continuation of nicardipine drip.      Review of Systems: As per HPI otherwise 10 point review of systems negative.   Past Medical History:  Diagnosis Date  . HLD (hyperlipidemia)   . Hypertension   . Pancreatitis   . Type II diabetes mellitus ([place])     Past Surgical History:  Procedure Laterality Date  . NO PAST SURGERIES      Social History:  reports that he quit smoking about 8 years ago. His smoking use included cigarettes. He has a 5.61 pack-year smoking history. He has never used smokeless tobacco. He reports current drug use. Drug: Marijuana. He reports that he does not drink alcohol.   No Known Allergies  Family History  Problem Relation Age of Onset  . Hypertension Mother   . Diabetes Father   . Hypertension Father   . Diabetes Brother   . Diabetes Maternal Grandmother   . Colon cancer Neg Hx   . Esophageal cancer Neg Hx   . Rectal cancer Neg Hx   . Stomach cancer Neg Hx     Family history reviewed and not pertinent    Prior to Admission medications   Medication Sig Start Date End Date Taking? Authorizing Provider  amLODipine (NORVASC) 10 MG tablet TAKE 1 TABLET (10 MG TOTAL) BY MOUTH DAILY. Patient taking differently: Take 10 mg by mouth daily. 06/09/20 06/09/21  Camila Li, MD  atorvastatin (LIPITOR) 40 MG tablet TAKE 2 TABLETS (80 MG) BY MOUTH DAILY. Patient taking  differently: Take 80 mg by mouth daily. 04/11/20 04/11/21  Maness, Arnette Norris, MD  Blood Glucose Monitoring Suppl (TRUE METRIX METER) w/Device KIT Use to check blood sugar TID. E11.65 Patient taking differently: 1 each by Other route in the morning, at noon, and at bedtime. Use to check blood sugar TID. E11.65 06/10/19   Charlott Rakes, MD  carvedilol (COREG) 25 MG tablet TAKE 1 TABLET (25 MG TOTAL) BY MOUTH 2 (TWO) TIMES DAILY WITH A MEAL. Patient taking differently: Take 25 mg by mouth 2 (two) times daily with a meal. 06/09/20 06/09/21  Camila Li, MD  glipiZIDE (GLUCOTROL) 5 MG tablet TAKE 1 TABLET (5 MG TOTAL) BY MOUTH 2 (TWO) TIMES DAILY BEFORE A MEAL. Patient taking differently: Take 5 mg by mouth 2 (two) times daily before a meal. 03/22/20 03/22/21  Hayden Rasmussen, MD  glucose blood test strip Use as instructed to check TID. E11.65 Patient taking differently: 1 each by Other route in the morning, at noon, and at bedtime. Use as instructed to check TID. E11.65 06/10/19   Charlott Rakes, MD  ibuprofen (ADVIL) 200 MG tablet Take 400 mg by mouth every 6 (six) hours as needed (pain).    [provider]  insulin glargine (LANTUS) 100 UNIT/ML Solostar Pen Inject  22 Units into the skin daily. 08/24/20 10/31/20  Nicolette Bang, DO  Insulin Pen Needle (TRUEPLUS 5-BEVEL PEN NEEDLES) 32G X 4 MM MISC Use to inject insulin daily. Patient taking differently: 1 each by Other route daily. Use to inject insulin daily. 06/10/19   Charlott Rakes, MD  lisinopril (ZESTRIL) 40 MG tablet TAKE 1 TABLET (40 MG TOTAL) BY MOUTH DAILY. Patient taking differently: Take 40 mg by mouth daily. 06/09/20 06/09/21  Camila Li, MD  omeprazole (PRILOSEC) 40 MG capsule Take 1 capsule (40 mg total) by mouth daily. 10/07/19   Mauri Pole, MD  ondansetron (ZOFRAN ODT) 4 MG disintegrating tablet Take 1 tablet (4 mg total) by mouth every 8 (eight) hours as needed for nausea or vomiting. 03/22/20   Hayden Rasmussen, MD  ondansetron (ZOFRAN) 4 MG tablet TAKE 1 TABLET (4 MG TOTAL) BY MOUTH EVERY 8 (EIGHT) HOURS AS NEEDED FOR NAUSEA OR VOMITING. Patient taking differently: Take 4 mg by mouth every 8 (eight) hours as needed for nausea or vomiting. 03/22/20 03/22/21  Hayden Rasmussen, MD  sildenafil (VIAGRA) 100 MG tablet TAKE 0.5-1 TABLETS (50-100 MG TOTAL) BY MOUTH DAILY AS NEEDED FOR ERECTILE DYSFUNCTION. Patient taking differently: Take 50-100 mg by mouth as needed for erectile dysfunction. 11/30/19 11/29/20  Nicolette Bang, DO  TRUEplus Lancets 28G MISC Use to check blood sugar three times daily. E11.65 Patient taking differently: 1 each by Other route in the morning, at noon, and at bedtime. Use to check blood sugar three times daily. E11.65 06/10/19   Charlott Rakes, MD     Objective    Physical Exam: Vitals:   09/11/20 2358 09/12/20 0003 09/12/20 0044 09/12/20 0200  BP: (!) 222/126 (!) 222/126 (!) 228/124 (!) 188/108  Pulse:  75 79 89  Resp:  _0 Temp:  98.7 F (37.1 C)    TempSrc:  Oral    SpO2:  100% 100% 99%  Weight:   86.2 kg   Height:   _1  (1.727 m)     General: appears to be stated age; alert, oriented Skin: warm, dry, no rash Head:  AT/[place] Mouth:  Oral mucosa membranes appear dry, normal dentition Neck: supple; trachea midline Heart:  RRR; did not appreciate any M/R/G Lungs: CTAB, did not appreciate any wheezes, rales, or rhonchi Abdomen: + BS; soft, ND, NT Vascular: 2+ pedal pulses b/l; 2+ radial pulses b/l Extremities: no peripheral edema, no muscle wasting Neuro: strength and sensation intact in upper and lower extremities b/l    Labs on Admission: I have personally reviewed following labs and imaging studies  CBC: Recent Labs  Lab 09/12/20 0003  WBC 8.3  NEUTROABS 5.1  HGB 10.3*  HCT 31.8*  MCV 83.5  PLT 570   Basic Metabolic Panel: Recent Labs  Lab 09/12/20 0003  NA 133*  K 4.4  CL 101  CO2 26  GLUCOSE 228*  BUN 19   CREATININE 1.48*  CALCIUM 8.8*   GFR: Estimated Creatinine Clearance: 70.1 mL/min (A) (by C-G formula based on SCr of 1.48 mg/dL (H)). Liver Function Tests: Recent Labs  Lab 09/12/20 0003  AST 24  ALT 17  ALKPHOS 92  BILITOT 0.8  PROT 6.8  ALBUMIN 2.8*   Recent Labs  Lab 09/12/20 0003  LIPASE 73*   No results for input(s): AMMONIA in the last 168 hours. Coagulation Profile: No results for input(s): INR, PROTIME in the last 168 hours. Cardiac Enzymes: No results for input(s): CKTOTAL,  CKMB, CKMBINDEX, TROPONINI in the last 168 hours. BNP (last 3 results) No results for input(s): PROBNP in the last 8760 hours. HbA1C: No results for input(s): HGBA1C in the last 72 hours. CBG: No results for input(s): GLUCAP in the last 168 hours. Lipid Profile: No results for input(s): CHOL, HDL, LDLCALC, TRIG, CHOLHDL, LDLDIRECT in the last 72 hours. Thyroid Function Tests: No results for input(s): TSH, T4TOTAL, FREET4, T3FREE, THYROIDAB in the last 72 hours. Anemia Panel: No results for input(s): VITAMINB12, FOLATE, FERRITIN, TIBC, IRON, RETICCTPCT in the last 72 hours. Urine analysis:    Component Value Date/Time   COLORURINE YELLOW 09/12/2020 0006   APPEARANCEUR HAZY (A) 09/12/2020 0006   LABSPEC 1.015 09/12/2020 0006   PHURINE 6.0 09/12/2020 0006   GLUCOSEU 150 (A) 09/12/2020 0006   HGBUR SMALL (A) 09/12/2020 0006   BILIRUBINUR NEGATIVE 09/12/2020 0006   KETONESUR NEGATIVE 09/12/2020 0006   PROTEINUR >=300 (A) 09/12/2020 0006   UROBILINOGEN 1.0 10/19/2013 2130   NITRITE NEGATIVE 09/12/2020 0006   LEUKOCYTESUR NEGATIVE 09/12/2020 0006    Radiological Exams on Admission: CT Head Wo Contrast  Result Date: 09/12/2020 CLINICAL DATA:  Headache EXAM: CT HEAD WITHOUT CONTRAST TECHNIQUE: Contiguous axial images were obtained from the base of the skull through the vertex without intravenous contrast. COMPARISON:  08/27/2019 FINDINGS: Brain: There is no mass, hemorrhage or  extra-axial collection. The size and configuration of the ventricles and extra-axial CSF spaces are normal. The brain parenchyma is normal, without acute or chronic infarction. Vascular: No abnormal hyperdensity of the major intracranial arteries or dural venous sinuses. No intracranial atherosclerosis. Skull: The visualized skull base, calvarium and extracranial soft tissues are normal. Sinuses/Orbits: No fluid levels or advanced mucosal thickening of the visualized paranasal sinuses. No mastoid or middle ear effusion. The orbits are normal. IMPRESSION: Normal head CT. Electronically Signed   By: Ulyses Jarred M.D.   On: 09/12/2020 01:45   CT Angio Chest/Abd/Pel for Dissection W and/or Wo Contrast  Result Date: 09/12/2020 CLINICAL DATA:  Chest pain and back pain. EXAM: CT ANGIOGRAPHY CHEST, ABDOMEN AND PELVIS TECHNIQUE: Non-contrast CT of the chest was initially obtained. Multidetector CT imaging through the chest, abdomen and pelvis was performed using the standard protocol during bolus administration of intravenous contrast. Multiplanar reconstructed images and MIPs were obtained and reviewed to evaluate the vascular anatomy. CONTRAST:  174m OMNIPAQUE IOHEXOL 350 MG/ML SOLN COMPARISON:  June 09, 2020 FINDINGS: CTA CHEST FINDINGS Cardiovascular: The thoracic aorta is normal in appearance without evidence of aneurysmal dilatation or dissection. Satisfactory opacification of the pulmonary arteries to the segmental level. No evidence of pulmonary embolism. Normal heart size. No pericardial effusion. Mediastinum/Nodes: No enlarged mediastinal, hilar, or axillary lymph nodes. Thyroid gland, trachea, and esophagus demonstrate no significant findings. Lungs/Pleura: Very mild atelectasis is seen within the posterior aspect of the bilateral lung bases. There is no evidence of acute infiltrate, pleural effusion or pneumothorax. Musculoskeletal: No chest wall abnormality. No acute or significant osseous findings.  Review of the MIP images confirms the above findings. CTA ABDOMEN AND PELVIS FINDINGS VASCULAR Aorta: Normal caliber aorta without aneurysm, dissection, vasculitis or significant stenosis. Celiac: Patent without evidence of aneurysm, dissection, vasculitis or significant stenosis. SMA: Patent without evidence of aneurysm, dissection, vasculitis or significant stenosis. Renals: Both renal arteries are patent without evidence of aneurysm, dissection, vasculitis, fibromuscular dysplasia or significant stenosis. IMA: Patent without evidence of aneurysm, dissection, vasculitis or significant stenosis. Inflow: Patent without evidence of aneurysm, dissection, vasculitis or significant stenosis. Veins: No obvious  venous abnormality within the limitations of this arterial phase study. Review of the MIP images confirms the above findings. NON-VASCULAR Hepatobiliary: No focal liver abnormality is seen. No gallstones, gallbladder wall thickening, or biliary dilatation. Pancreas: Unremarkable. No pancreatic ductal dilatation or surrounding inflammatory changes. Spleen: Normal in size without focal abnormality. Adrenals/Urinary Tract: Adrenal glands are unremarkable. Kidneys are normal, without renal calculi, focal lesion, or hydronephrosis. Bladder is unremarkable. Stomach/Bowel: There is a small hiatal hernia. Appendix appears normal. No evidence of bowel wall thickening, distention, or inflammatory changes. Lymphatic: No abnormal abdominal or pelvic lymph nodes are identified. Reproductive: Prostate is unremarkable. Other: No abdominal wall hernia or abnormality. No abdominopelvic ascites. Musculoskeletal: No acute or significant osseous findings. Review of the MIP images confirms the above findings. IMPRESSION: 1. No evidence of acute or active process within the chest, abdomen or pelvis. 2. No evidence of aneurysmal dilatation or dissection of the thoracic aorta, abdominal aorta or arterial structures within the abdomen or  pelvis. Electronically Signed   By: Virgina Norfolk M.D.   On: 09/12/2020 03:26     EKG: Independently reviewed, with result as described above.    Assessment/Plan   Fredick R Juluis Fitzsimmons. is a 40 y.o. male with medical history significant for hypertension, type 2 diabetes mellitus, hyperlipidemia, GERD who is admitted to [facility] on 09/11/2020 with hypertensive crisis after presenting from home to [facility] ED complaining of nausea and vomiting.    Principal Problem:   Hypertensive crisis Active Problems:   Essential hypertension   Hyperlipidemia   Type 2 diabetes mellitus without complication, with long-term current use of insulin (HCC)   Nausea & vomiting   AKI (acute kidney injury) ([place])   GERD (gastroesophageal reflux disease)     #) Hypertensive crisis: On the basis of presenting blood pressures of 222/126, with associated mean arterial pressure of 158 mmHg and associated with headache, N/V over the  preceding 1-2 days, which are now significantly improved with improving antihypertensive control, as outlined above.  Presenting acute kidney injury may represent evidence of associated endorgan damage or be present on the basis of dehydration in the setting of associated nausea/vomiting and limited oral intake over the last day.  No evidence of acute focal neurologic deficits, well CT head shows no evidence of acute intracranial process.  His elevated blood pressures.  Basis of suboptimal compliance with his outpatient hypertensive medications, conveying that he has been completely off of all 3 of his home blood pressure medications after he ran out of his prescription 2 weeks ago.  CTA chest abdomen pelvis showed no evidence of aortic dissection, as further described above.  We will further evaluate by checking TSH and urinary drug screen.    In terms of short-term blood pressures goals relating to this presentation, the patient has undergone an initial 10-20% reduction in systolic  blood pressure or mean arterial pressure over the first hour following presentation.  Will strive for additional reduction in blood pressure with ensuing systolic goal of less than 188 over the 24 hours following initial presentation, which will coincide with a MAP goal of less than 134 mmHg, with these fingers consistent with 24-hour reduction of presenting blood pressure by 15 to 35%.  We will slowly begin resumption of home antihypertensive medications, starting with Coreg.  We will hold lisinopril for now in the setting of concomitant presenting acute kidney injury.  Additionally, will hold off on resumption revascularize the patient is already on a calcium channel blocker in the  form of his nicardipine drip.      Plan: Will closely monitor ensuing BP's, with goal BP's as outlined above. Will continue to monitor for laboratory evidence of end organ damage with repeat CMP in the morning. Check TSH and UDS. Monitor strict I's and O's.  Monitor on telemetry. Emphasized to the patient the importance of improved compliance with outpatient antihypertensive regimen.  I have consulted the transition of care team to assist the patient with procuring his outpatient medications to help facilitate improved compliance.  Resume Coreg at this time, with further weaning of nifedipine drip, with parameters as outlined above.  Currently holding home lisinopril and amlodipine, as noted above.  Every 4 hours neurochecks x4 occurrences have been ordered.      #) Nausea/vomiting: Appears to be a consequence of patient's presenting hypertensive crisis: Symptoms improving with improving antihypertensive control on nicardipine drip, as further described above.   Plan: Work-up and management of presenting evidence raises, as above.  As needed IV Ativan.  Repeat CMP in the morning.  Add on serum magnesium level.  Monitor strict I's and O's.      #) Acute kidney injury: Presenting creatinine noted to be 1.48 relative to  most recent prior value of 1.15 in January 2022.  Suspect prerenal etiology potentially multifactorial contributions, including endorgan ramification of presenting hypertensive crisis versus as a consequence of intravascular depletion due to recent nausea/vomiting.  The presence of hyaline casts on urinalysis is consistent with an element of dehydration.  Plan: Monitor strict I's and O's and daily weights.  Tempt avoid nephrotoxic agents.  Holding home lisinopril for now.  Continue with IV fluids, as above.  Add on random urine sodium urine and urine creatinine.  Management of hypertensive crisis, as above.  Repeat BMP in the morning.      #) Type 2 diabetes mellitus: Outpatient insulin regimen consists of Lantus 20 units subcu daily in the absence of any short acting insulin.  He is also on glipizide as a sole outpatient oral hypoglycemic medication.  Presenting blood sugar greater than 200, although no evidence of anion gap metabolic acidosis to suggest DKA.  Plan: Accu-Cheks before every meal and at bedtime with moderate dose sliding scale insulin.  Holding home basal insulin for now.  Hold home glipizide during this hospitalization.     #) Hyperlipidemia: On atorvastatin as an outpatient.  Plan: Resume home statin.    #) GERD: On omeprazole at home.  Plan: Continue PPI     DVT prophylaxis: scd's  Code Status: Full code Family Communication: none Disposition Plan: Per Rounding Team Consults called: none  Admission status: obs; step down.    Of note, this patient was added by me to the following Admit List/Treatment Team:  mcadmits.     PLEASE NOTE THAT DRAGON DICTATION SOFTWARE WAS USED IN THE CONSTRUCTION OF THIS NOTE.   Rhetta Mura DO [facility] Pager [phone] From [place]   09/12/2020, 4:05 AM

## 2020-09-12 NOTE — ED Notes (Signed)
BP at goal. Turned off cardene gtt. Dr. Mahala Menghini notified via chat message. Pt sleeping.

## 2020-09-12 NOTE — Plan of Care (Signed)

## 2020-09-12 NOTE — ED Notes (Signed)
Pts oxygen goes down in low 90s while asleep. Pt placed on 2L [place]

## 2020-09-12 NOTE — Hospital Course (Signed)
Bun/creat 19/1.48-->14/1.25 Hemo 10.4 Wbc 8.9 Cbg 218---228

## 2020-09-12 NOTE — Progress Notes (Signed)
Patient seen and examined and agree with plan of care  Briefly 40 year old African-American male Prior pancreatitis-Per patient no alcohol for at least 2 decades HTN, DM TY 2 HLD reflux Admitted with intermittent nausea vomiting nonbloody nonbilious Found to have hypertensive emergency + AKI Noncompliant with several antihypertensives  Lipase 73 BUNs/creatinine 19/1.4-->14/1.2 Troponin trend flat 20 range  Patient was weaned off Cardene drip blood pressures are now in the 150s Attempt diet if able to tolerate we will give all meds p.o. including Ultram for pain If not then we can reconsider IV pain control-patient was somnolent this morning had received Dilaudid in addition to Ativan-I explained this to the patient as this is a safety thing and would not want him to be obtunded  Expect discharge within 24 to 48 hours depending on progress  Pleas Koch, MD [facility] 9:48 AM

## 2020-09-13 ENCOUNTER — Other Ambulatory Visit (HOSPITAL_COMMUNITY): Payer: Self-pay

## 2020-09-13 ENCOUNTER — Other Ambulatory Visit: Payer: Self-pay

## 2020-09-13 LAB — GLUCOSE, CAPILLARY
Glucose-Capillary: 140 mg/dL — ABNORMAL HIGH (ref 70–99)
Glucose-Capillary: 172 mg/dL — ABNORMAL HIGH (ref 70–99)

## 2020-09-13 LAB — CBC WITH DIFFERENTIAL/PLATELET
Abs Immature Granulocytes: 0.01 10*3/uL (ref 0.00–0.07)
Basophils Absolute: 0.1 10*3/uL (ref 0.0–0.1)
Basophils Relative: 1 %
Eosinophils Absolute: 0.1 10*3/uL (ref 0.0–0.5)
Eosinophils Relative: 2 %
HCT: 31.5 % — ABNORMAL LOW (ref 39.0–52.0)
Hemoglobin: 10.4 g/dL — ABNORMAL LOW (ref 13.0–17.0)
Immature Granulocytes: 0 %
Lymphocytes Relative: 43 %
Lymphs Abs: 2.8 10*3/uL (ref 0.7–4.0)
MCH: 27.2 pg (ref 26.0–34.0)
MCHC: 33 g/dL (ref 30.0–36.0)
MCV: 82.5 fL (ref 80.0–100.0)
Monocytes Absolute: 0.4 10*3/uL (ref 0.1–1.0)
Monocytes Relative: 7 %
Neutro Abs: 3.1 10*3/uL (ref 1.7–7.7)
Neutrophils Relative %: 47 %
Platelets: 282 10*3/uL (ref 150–400)
RBC: 3.82 MIL/uL — ABNORMAL LOW (ref 4.22–5.81)
RDW: 12.6 % (ref 11.5–15.5)
WBC: 6.6 10*3/uL (ref 4.0–10.5)
nRBC: 0 % (ref 0.0–0.2)

## 2020-09-13 LAB — COMPREHENSIVE METABOLIC PANEL
ALT: 14 U/L (ref 0–44)
AST: 19 U/L (ref 15–41)
Albumin: 2.7 g/dL — ABNORMAL LOW (ref 3.5–5.0)
Alkaline Phosphatase: 74 U/L (ref 38–126)
Anion gap: 4 — ABNORMAL LOW (ref 5–15)
BUN: 14 mg/dL (ref 6–20)
CO2: 26 mmol/L (ref 22–32)
Calcium: 8.5 mg/dL — ABNORMAL LOW (ref 8.9–10.3)
Chloride: 105 mmol/L (ref 98–111)
Creatinine, Ser: 1.37 mg/dL — ABNORMAL HIGH (ref 0.61–1.24)
GFR, Estimated: >60 mL/min (ref >60)
Glucose, Bld: 134 mg/dL — ABNORMAL HIGH (ref 70–99)
Potassium: 4.1 mmol/L (ref 3.5–5.1)
Sodium: 135 mmol/L (ref 135–145)
Total Bilirubin: 0.8 mg/dL (ref 0.3–1.2)
Total Protein: 6.2 g/dL — ABNORMAL LOW (ref 6.5–8.1)

## 2020-09-13 MED ORDER — ATORVASTATIN CALCIUM 80 MG PO TABS
80.0000 mg | ORAL_TABLET | Freq: Every day | ORAL | 0 refills | Status: DC
Start: 2020-09-13 — End: 2020-10-25
  Filled 2020-09-13: qty 30, 30d supply, fill #0

## 2020-09-13 MED ORDER — HYDRALAZINE HCL 50 MG PO TABS
50.0000 mg | ORAL_TABLET | Freq: Three times a day (TID) | ORAL | Status: DC
  Administered 2020-09-13: 50 mg via ORAL
  Filled 2020-09-13: qty 1

## 2020-09-13 MED ORDER — PENTIPS 32G X 4 MM MISC
3 refills | Status: DC
  Filled 2020-09-13: qty 100, 30d supply, fill #0

## 2020-09-13 MED ORDER — OMEPRAZOLE 40 MG PO CPDR
40.0000 mg | DELAYED_RELEASE_CAPSULE | Freq: Every day | ORAL | 3 refills | Status: DC
  Filled 2020-09-13: qty 30, 30d supply, fill #0
  Filled 2020-10-25: qty 30, 30d supply, fill #1

## 2020-09-13 MED ORDER — HYDRALAZINE HCL 25 MG PO TABS
25.0000 mg | ORAL_TABLET | Freq: Three times a day (TID) | ORAL | Status: DC
  Administered 2020-09-13: 25 mg via ORAL
  Filled 2020-09-13: qty 1

## 2020-09-13 MED ORDER — CARVEDILOL 25 MG PO TABS
25.0000 mg | ORAL_TABLET | Freq: Two times a day (BID) | ORAL | 0 refills | Status: DC
  Filled 2020-09-13: qty 60, 30d supply, fill #0

## 2020-09-13 MED ORDER — LISINOPRIL 40 MG PO TABS
40.0000 mg | ORAL_TABLET | Freq: Every day | ORAL | 0 refills | Status: DC
  Filled 2020-09-13: qty 30, 30d supply, fill #0

## 2020-09-13 MED ORDER — INSULIN GLARGINE 100 UNIT/ML SOLOSTAR PEN
26.0000 [IU] | PEN_INJECTOR | Freq: Every day | SUBCUTANEOUS | 0 refills | Status: DC
  Filled 2020-09-13: qty 9, 34d supply, fill #0

## 2020-09-13 MED ORDER — AMLODIPINE BESYLATE 10 MG PO TABS
10.0000 mg | ORAL_TABLET | Freq: Every day | ORAL | 0 refills | Status: DC
  Filled 2020-09-13: qty 30, 30d supply, fill #0

## 2020-09-13 MED ORDER — GLIPIZIDE 5 MG PO TABS
5.0000 mg | ORAL_TABLET | Freq: Two times a day (BID) | ORAL | 0 refills | Status: DC
  Filled 2020-09-13: qty 60, 30d supply, fill #0

## 2020-09-13 MED FILL — Sildenafil Citrate Tab 100 MG: ORAL | 30 days supply | Qty: 10 | Fill #0 | Status: AC

## 2020-09-13 NOTE — TOC Transition Note (Addendum)
Transition of Care [facility]) - CM/SW Discharge Note   Patient Details  Name: Randy [name]. MRN: [id number] Date of Birth: 07-07-1980  Transition of Care [facility]) CM/SW Contact:  Leone Haven, RN Phone Number: 09/13/2020, 2:41 PM   Clinical Narrative:    NCM spoke with patient, he is for dc today, NCM assisted him with medication ast with Match letter.  TOC filled meds, will bring to room prior to dc.  He will need transportation ast also.  Secretary setting up transport thru [employer]. Patient has follow up apt at [facility]     Final next level of care: Home/Self Care Barriers to Discharge: No Barriers Identified   Patient Goals and CMS Choice Patient states their goals for this hospitalization and ongoing recovery are:: return home   Choice offered to / list presented to : NA  Discharge Placement                       Discharge Plan and Services                  DME Agency: NA       HH Arranged: NA          Social Determinants of Health (SDOH) Interventions     Readmission Risk Interventions No flowsheet data found.

## 2020-09-13 NOTE — Discharge Summary (Signed)
Physician Discharge Summary  [place]. ZOX:[id number] DOB: 08-Oct-1980 DOA: 09/11/2020  PCP: Nicolette Bang, DO  Admit date: 09/11/2020 Discharge date: 09/13/2020  Admitted From: Home Discharge disposition: Home   Recommendations for Outpatient Follow-Up:  1. To be compliant with a low-salt diet and continually take his meds without missing doses   Discharge Diagnosis:   Principal Problem:   Hypertensive crisis Active Problems:   Essential hypertension   Hyperlipidemia   Type 2 diabetes mellitus without complication, with long-term current use of insulin (HCC)   Nausea & vomiting   AKI (acute kidney injury) ([place])   GERD (gastroesophageal reflux disease)    Discharge Condition: Improved.  Diet recommendation: Low sodium, heart healthy.  Carbohydrate-modified.  Wound care: None.  Code status: Full.   History of Present Illness:   Randy [name]. is a 40 y.o. male with medical history significant for hypertension, type 2 diabetes mellitus, hyperlipidemia, GERD who is admitted to [facility] on 09/11/2020 with hypertensive crisis after presenting from home to [facility] ED complaining of nausea and vomiting.   The patient reports 1 day of intermittent nausea resulting in 3-4 episodes of nonbloody, nonbilious emesis.  Denies any associated abdominal discomfort, diarrhea, melena, or hematochezia.  No recent trauma or travel.  Not associate with any subjective fever, chills, rigors, or generalized myalgias.  He also denies any associated neck stiffness, rhinitis, rhinorrhea, sore throat, wheezing, cough.  No recent traveling or known COVID-19 exposures.  No acute urinary symptoms, including no dysuria, gross hematuria, or change in urinary urgency/frequency.  He reported a 2 to 3-second episode of sharp left-sided chest discomfort that occurred while he was in the process of vomiting.  This was not associate with any radiation, and was self-limited,  with complete resolution within a few seconds of development, and no subsequent return of the symptoms.  This was not associated with any shortness of breath, palpitations, diaphoresis.  He was at rest at the time of experiencing this discomfort, therefore unable to evaluate if there was any exertional component.  He has been unable to reproduce his transient episode of chest discomfort and notes no positional exacerbating factor.  Denies any recent hemoptysis, calf tenderness, lower extremity erythema.  No personal or family history of acute DVT or PE.   He acknowledges intermittent symmetrical frontal headache over the last day, but denies any associated acute focal weakness, acute focal paresthesias, numbness, vertigo, acute change in vision, dysarthria, facial droop, or dysphagia.   He confirms a history of hypertension, for which she is prescribed Coreg, Norvasc, and lisinopril.  However he conveys that he completely ran out of all of these medications approximately 2 weeks ago, and has been unable to procure refills in the interval.  While off of these antihypertensive medications over the last 2 weeks, he has not been checking his blood pressure, but notes that he has previously experienced the above constellation of symptoms when his blood pressure is running high.  Reports occasionally smoking marijuana, but otherwise denies any use of recreational drugs, including the use of cocaine or methamphetamine.  No recent trauma or known history of aneurysm.   Hospital Course by Problem:    #)Hypertensive crisis: On the basis of presenting blood pressures of 222/126, with associated mean arterial pressure of 158 mmHg and associated with headache, N/V over the  preceding 1-2 days -Did not fill recent blood pressure medications - Resumed home medication and weaned off Cardene drip -  Blood pressure better controlled upon discharge and patient anxious to go home - Encouraged low-salt diet and  compliance with medications - Patient given medications through TOC   Nausea/vomiting:  -Resolved and patient anxious to be discharged home  Acute kidney injury:  -Follow-up BMP as an outpatient once blood pressure controlled, avoid hypotension - Making urine  Type 2 diabetes mellitus:  Plan: Resume home meds Encourage compliance  Hyperlipidemia: On atorvastatin as an outpatient. Plan: Resume home statin.  GERD: On omeprazole at home Plan: Continue PPI     Medical Consultants:      Discharge Exam:   Vitals:   09/13/20 1130 09/13/20 1200  BP: (!) 151/93 (!) 142/78  Pulse: 73 70  Resp: 18   Temp: 97.9 F (36.6 C)   SpO2: 97% 95%   Vitals:   09/13/20 1030 09/13/20 1100 09/13/20 1130 09/13/20 1200  BP: (!) 162/86 (!) 159/77 (!) 151/93 (!) 142/78  Pulse: 75 64 73 70  Resp:   18   Temp:   97.9 F (36.6 C)   TempSrc:   Oral   SpO2: 98% 96% 97% 95%  Weight:      Height:        General exam: Appears calm and comfortable.  Asking to go home   The results of significant diagnostics from this hospitalization (including imaging, microbiology, ancillary and laboratory) are listed below for reference.     Procedures and Diagnostic Studies:   CT Head Wo Contrast  Result Date: 09/12/2020 CLINICAL DATA:  Headache EXAM: CT HEAD WITHOUT CONTRAST TECHNIQUE: Contiguous axial images were obtained from the base of the skull through the vertex without intravenous contrast. COMPARISON:  08/27/2019 FINDINGS: Brain: There is no mass, hemorrhage or extra-axial collection. The size and configuration of the ventricles and extra-axial CSF spaces are normal. The brain parenchyma is normal, without acute or chronic infarction. Vascular: No abnormal hyperdensity of the major intracranial arteries or dural venous sinuses. No intracranial atherosclerosis. Skull: The visualized skull base, calvarium and extracranial soft tissues are normal. Sinuses/Orbits: No fluid levels or advanced  mucosal thickening of the visualized paranasal sinuses. No mastoid or middle ear effusion. The orbits are normal. IMPRESSION: Normal head CT. Electronically Signed   By: Ulyses Jarred M.D.   On: 09/12/2020 01:45   CT Angio Chest/Abd/Pel for Dissection W and/or Wo Contrast  Result Date: 09/12/2020 CLINICAL DATA:  Chest pain and back pain. EXAM: CT ANGIOGRAPHY CHEST, ABDOMEN AND PELVIS TECHNIQUE: Non-contrast CT of the chest was initially obtained. Multidetector CT imaging through the chest, abdomen and pelvis was performed using the standard protocol during bolus administration of intravenous contrast. Multiplanar reconstructed images and MIPs were obtained and reviewed to evaluate the vascular anatomy. CONTRAST:  116m OMNIPAQUE IOHEXOL 350 MG/ML SOLN COMPARISON:  June 09, 2020 FINDINGS: CTA CHEST FINDINGS Cardiovascular: The thoracic aorta is normal in appearance without evidence of aneurysmal dilatation or dissection. Satisfactory opacification of the pulmonary arteries to the segmental level. No evidence of pulmonary embolism. Normal heart size. No pericardial effusion. Mediastinum/Nodes: No enlarged mediastinal, hilar, or axillary lymph nodes. Thyroid gland, trachea, and esophagus demonstrate no significant findings. Lungs/Pleura: Very mild atelectasis is seen within the posterior aspect of the bilateral lung bases. There is no evidence of acute infiltrate, pleural effusion or pneumothorax. Musculoskeletal: No chest wall abnormality. No acute or significant osseous findings. Review of the MIP images confirms the above findings. CTA ABDOMEN AND PELVIS FINDINGS VASCULAR Aorta: Normal caliber aorta without aneurysm, dissection, vasculitis or significant stenosis. Celiac:  Patent without evidence of aneurysm, dissection, vasculitis or significant stenosis. SMA: Patent without evidence of aneurysm, dissection, vasculitis or significant stenosis. Renals: Both renal arteries are patent without evidence of  aneurysm, dissection, vasculitis, fibromuscular dysplasia or significant stenosis. IMA: Patent without evidence of aneurysm, dissection, vasculitis or significant stenosis. Inflow: Patent without evidence of aneurysm, dissection, vasculitis or significant stenosis. Veins: No obvious venous abnormality within the limitations of this arterial phase study. Review of the MIP images confirms the above findings. NON-VASCULAR Hepatobiliary: No focal liver abnormality is seen. No gallstones, gallbladder wall thickening, or biliary dilatation. Pancreas: Unremarkable. No pancreatic ductal dilatation or surrounding inflammatory changes. Spleen: Normal in size without focal abnormality. Adrenals/Urinary Tract: Adrenal glands are unremarkable. Kidneys are normal, without renal calculi, focal lesion, or hydronephrosis. Bladder is unremarkable. Stomach/Bowel: There is a small hiatal hernia. Appendix appears normal. No evidence of bowel wall thickening, distention, or inflammatory changes. Lymphatic: No abnormal abdominal or pelvic lymph nodes are identified. Reproductive: Prostate is unremarkable. Other: No abdominal wall hernia or abnormality. No abdominopelvic ascites. Musculoskeletal: No acute or significant osseous findings. Review of the MIP images confirms the above findings. IMPRESSION: 1. No evidence of acute or active process within the chest, abdomen or pelvis. 2. No evidence of aneurysmal dilatation or dissection of the thoracic aorta, abdominal aorta or arterial structures within the abdomen or pelvis. Electronically Signed   By: Virgina Norfolk M.D.   On: 09/12/2020 03:26     Labs:   Basic Metabolic Panel: Recent Labs  Lab 09/12/20 0003 09/12/20 0518 09/13/20 0350  NA 133* 134* 135  K 4.4 4.4 4.1  CL 101 101 105  CO2 '26 25 26  ' GLUCOSE 228* 218* 134*  BUN '19 14 14  ' CREATININE 1.48* 1.25* 1.37*  CALCIUM 8.8* 8.6* 8.5*  MG  --  2.0  --    GFR Estimated Creatinine Clearance: 77.6 mL/min (A) (by  C-G formula based on SCr of 1.37 mg/dL (H)). Liver Function Tests: Recent Labs  Lab 09/12/20 0003 09/13/20 0350  AST 24 19  ALT 17 14  ALKPHOS 92 74  BILITOT 0.8 0.8  PROT 6.8 6.2*  ALBUMIN 2.8* 2.7*   Recent Labs  Lab 09/12/20 0003  LIPASE 73*   No results for input(s): AMMONIA in the last 168 hours. Coagulation profile No results for input(s): INR, PROTIME in the last 168 hours.  CBC: Recent Labs  Lab 09/12/20 0003 09/12/20 0518 09/13/20 0350  WBC 8.3 8.9 6.6  NEUTROABS 5.1  --  3.1  HGB 10.3* 10.4* 10.4*  HCT 31.8* 32.5* 31.5*  MCV 83.5 84.0 82.5  PLT 277 310 282   Cardiac Enzymes: No results for input(s): CKTOTAL, CKMB, CKMBINDEX, TROPONINI in the last 168 hours. BNP: Invalid input(s): POCBNP CBG: Recent Labs  Lab 09/12/20 1251 09/12/20 1612 09/12/20 2057 09/13/20 0644 09/13/20 1126  GLUCAP 182* 232* 157* 140* 172*   D-Dimer No results for input(s): DDIMER in the last 72 hours. Hgb A1c No results for input(s): HGBA1C in the last 72 hours. Lipid Profile No results for input(s): CHOL, HDL, LDLCALC, TRIG, CHOLHDL, LDLDIRECT in the last 72 hours. Thyroid function studies Recent Labs    09/12/20 0518  TSH 1.085   Anemia work up No results for input(s): VITAMINB12, FOLATE, FERRITIN, TIBC, IRON, RETICCTPCT in the last 72 hours. Microbiology Recent Results (from the past 240 hour(s))  Resp Panel by RT-PCR (Flu A&B, Covid) Nasopharyngeal Swab     Status: None   Collection Time: 09/12/20  3:50 AM  Specimen: Nasopharyngeal Swab; Nasopharyngeal(NP) swabs in vial transport medium  Result Value Ref Range Status   SARS Coronavirus 2 by RT PCR NEGATIVE NEGATIVE Final    Comment: (NOTE) SARS-CoV-2 target nucleic acids are NOT DETECTED.  The SARS-CoV-2 RNA is generally detectable in upper respiratory specimens during the acute phase of infection. The lowest concentration of SARS-CoV-2 viral copies this assay can detect is 138 copies/mL. A negative result  does not preclude SARS-Cov-2 infection and should not be used as the sole basis for treatment or other patient management decisions. A negative result may occur with  improper specimen collection/handling, submission of specimen other than nasopharyngeal swab, presence of viral mutation(s) within the areas targeted by this assay, and inadequate number of viral copies(<138 copies/mL). A negative result must be combined with clinical observations, patient history, and epidemiological information. The expected result is Negative.  Fact Sheet for Patients:  EntrepreneurPulse.com.au  Fact Sheet for Healthcare Providers:  IncredibleEmployment.be  This test is no t yet approved or cleared by the [place] FDA and  has been authorized for detection and/or diagnosis of SARS-CoV-2 by FDA under an Emergency Use Authorization (EUA). This EUA will remain  in effect (meaning this test can be used) for the duration of the COVID-19 declaration under Section 564(b)(1) of the Act, 21 U.S.C.section 360bbb-3(b)(1), unless the authorization is terminated  or revoked sooner.       Influenza A by PCR NEGATIVE NEGATIVE Final   Influenza B by PCR NEGATIVE NEGATIVE Final    Comment: (NOTE) The Xpert Xpress SARS-CoV-2/FLU/RSV plus assay is intended as an aid in the diagnosis of influenza from Nasopharyngeal swab specimens and should not be used as a sole basis for treatment. Nasal washings and aspirates are unacceptable for Xpert Xpress SARS-CoV-2/FLU/RSV testing.  Fact Sheet for Patients: EntrepreneurPulse.com.au  Fact Sheet for Healthcare Providers: IncredibleEmployment.be  This test is not yet approved or cleared by the [place] FDA and has been authorized for detection and/or diagnosis of SARS-CoV-2 by FDA under an Emergency Use Authorization (EUA). This EUA will remain in effect (meaning this test can be used) for  the duration of the COVID-19 declaration under Section 564(b)(1) of the Act, 21 U.S.C. section 360bbb-3(b)(1), unless the authorization is terminated or revoked.  Performed at [facility], [place] [address], [place], [place] [address]   MRSA PCR Screening     Status: None   Collection Time: 09/12/20  3:26 PM   Specimen: Nasal Mucosa; Nasopharyngeal  Result Value Ref Range Status   MRSA by PCR NEGATIVE NEGATIVE Final    Comment:        The GeneXpert MRSA Assay (FDA approved for NASAL specimens only), is one component of a comprehensive MRSA colonization surveillance program. It is not intended to diagnose MRSA infection nor to guide or monitor treatment for MRSA infections. Performed at [facility], [place] [address] [place], [place] [address]      Discharge Instructions:   Discharge Instructions    Diet - low sodium heart healthy   Complete by: As directed    Diet Carb Modified   Complete by: As directed    Discharge instructions   Complete by: As directed    BMP 1 week Be sure to take your medications   Increase activity slowly   Complete by: As directed      Allergies as of 09/13/2020      Reactions   Tomato       Medication List  TAKE these medications   amLODipine 10 MG tablet Commonly known as: NORVASC TAKE 1 TABLET (10 MG TOTAL) BY MOUTH DAILY. What changed: how much to take   atorvastatin 40 MG tablet Commonly known as: LIPITOR TAKE 2 TABLETS (80 MG) BY MOUTH DAILY. What changed: how much to take   carvedilol 25 MG tablet Commonly known as: COREG TAKE 1 TABLET (25 MG TOTAL) BY MOUTH 2 (TWO) TIMES DAILY WITH A MEAL. What changed: how much to take   glipiZIDE 5 MG tablet Commonly known as: GLUCOTROL TAKE 1 TABLET (5 MG TOTAL) BY MOUTH 2 (TWO) TIMES DAILY BEFORE A MEAL. What changed: how much to take   glucose blood test strip Use as instructed to check TID. E11.65 What changed:   how much to take  how to take this  when to  take this   insulin glargine 100 UNIT/ML Solostar Pen Commonly known as: LANTUS Inject 22 Units into the skin daily. What changed: how much to take   lisinopril 40 MG tablet Commonly known as: ZESTRIL TAKE 1 TABLET (40 MG TOTAL) BY MOUTH DAILY. What changed: how much to take   omeprazole 40 MG capsule Commonly known as: PRILOSEC Take 1 capsule (40 mg total) by mouth daily.   ondansetron 4 MG disintegrating tablet Commonly known as: Zofran ODT Take 1 tablet (4 mg total) by mouth every 8 (eight) hours as needed for nausea or vomiting.   sildenafil 100 MG tablet Commonly known as: VIAGRA TAKE 0.5-1 TABLETS (50-100 MG TOTAL) BY MOUTH DAILY AS NEEDED FOR ERECTILE DYSFUNCTION. What changed:   how much to take  how to take this  when to take this  reasons to take this   True Metrix Meter w/Device Kit Use to check blood sugar TID. E11.65 What changed:   how much to take  how to take this  when to take this   TRUEplus 5-Bevel Pen Needles 32G X 4 MM Misc Generic drug: Insulin Pen Needle Use to inject insulin daily. What changed:   how much to take  how to take this  when to take this   TRUEplus Lancets 28G Misc Use to check blood sugar three times daily. E11.65 What changed:   how much to take  how to take this  when to take this       Follow-up [facility] at [facility] .   Specialty: Urgent Care Why: May 19,2022 '@2' :20PM Contact information: [phone] [address] [place] [phone]               Time coordinating discharge: 25 min  Signed:  Geradine Girt DO  [facility] 09/13/2020, 1:24 PM

## 2020-09-13 NOTE — TOC Progression Note (Signed)
Transition of Care (TOC) - Progression Note    Patient Details  Name: Randy [name]. MRN: [id number] Date of Birth: Sep 22, 1980  [facility]) CM/SW Contact  Randy Haven, RN Phone Number: 09/13/2020, 12:56 PM  Clinical Narrative:    Patient will need ast with medications,  NCM will assist with Match.        Expected Discharge Plan and Services                                                 Social Determinants of Health (SDOH) Interventions    Readmission Risk Interventions [name] flowsheet data found.

## 2020-09-14 ENCOUNTER — Telehealth: Payer: Self-pay

## 2020-09-14 ENCOUNTER — Encounter (HOSPITAL_COMMUNITY): Payer: Self-pay

## 2020-09-14 ENCOUNTER — Emergency Department (HOSPITAL_COMMUNITY)
Admission: EM | Admit: 2020-09-14 | Discharge: 2020-09-14 | Disposition: A | Discharge status: Home / Self Care | Payer: Self-pay | Attending: Emergency Medicine | Admitting: Emergency Medicine

## 2020-09-14 ENCOUNTER — Other Ambulatory Visit: Payer: Self-pay

## 2020-09-14 DIAGNOSIS — Z7984 Long term (current) use of oral hypoglycemic drugs: Secondary | ICD-10-CM | POA: Insufficient documentation

## 2020-09-14 DIAGNOSIS — N289 Disorder of kidney and ureter, unspecified: Secondary | ICD-10-CM | POA: Insufficient documentation

## 2020-09-14 DIAGNOSIS — R1084 Generalized abdominal pain: Secondary | ICD-10-CM

## 2020-09-14 DIAGNOSIS — Z794 Long term (current) use of insulin: Secondary | ICD-10-CM | POA: Insufficient documentation

## 2020-09-14 DIAGNOSIS — E785 Hyperlipidemia, unspecified: Secondary | ICD-10-CM | POA: Insufficient documentation

## 2020-09-14 DIAGNOSIS — E1169 Type 2 diabetes mellitus with other specified complication: Secondary | ICD-10-CM | POA: Insufficient documentation

## 2020-09-14 DIAGNOSIS — I10 Essential (primary) hypertension: Secondary | ICD-10-CM | POA: Insufficient documentation

## 2020-09-14 DIAGNOSIS — N390 Urinary tract infection, site not specified: Secondary | ICD-10-CM | POA: Insufficient documentation

## 2020-09-14 DIAGNOSIS — Z79899 Other long term (current) drug therapy: Secondary | ICD-10-CM | POA: Insufficient documentation

## 2020-09-14 DIAGNOSIS — Z87891 Personal history of nicotine dependence: Secondary | ICD-10-CM | POA: Insufficient documentation

## 2020-09-14 LAB — CBC
HCT: 32 % — ABNORMAL LOW (ref 39.0–52.0)
Hemoglobin: 10.4 g/dL — ABNORMAL LOW (ref 13.0–17.0)
MCH: 27 pg (ref 26.0–34.0)
MCHC: 32.5 g/dL (ref 30.0–36.0)
MCV: 83.1 fL (ref 80.0–100.0)
Platelets: 296 10*3/uL (ref 150–400)
RBC: 3.85 MIL/uL — ABNORMAL LOW (ref 4.22–5.81)
RDW: 12.6 % (ref 11.5–15.5)
WBC: 7.9 10*3/uL (ref 4.0–10.5)
nRBC: 0 % (ref 0.0–0.2)

## 2020-09-14 LAB — COMPREHENSIVE METABOLIC PANEL
ALT: 17 U/L (ref 0–44)
AST: 25 U/L (ref 15–41)
Albumin: 3.2 g/dL — ABNORMAL LOW (ref 3.5–5.0)
Alkaline Phosphatase: 71 U/L (ref 38–126)
Anion gap: 11 (ref 5–15)
BUN: 23 mg/dL — ABNORMAL HIGH (ref 6–20)
CO2: 23 mmol/L (ref 22–32)
Calcium: 9 mg/dL (ref 8.9–10.3)
Chloride: 102 mmol/L (ref 98–111)
Creatinine, Ser: 2.13 mg/dL — ABNORMAL HIGH (ref 0.61–1.24)
GFR, Estimated: 39 mL/min — ABNORMAL LOW (ref >60)
Glucose, Bld: 158 mg/dL — ABNORMAL HIGH (ref 70–99)
Potassium: 4 mmol/L (ref 3.5–5.1)
Sodium: 136 mmol/L (ref 135–145)
Total Bilirubin: 0.5 mg/dL (ref 0.3–1.2)
Total Protein: 6.7 g/dL (ref 6.5–8.1)

## 2020-09-14 LAB — URINALYSIS, ROUTINE W REFLEX MICROSCOPIC
Bilirubin Urine: NEGATIVE
Glucose, UA: 150 mg/dL — AB
Hgb urine dipstick: NEGATIVE
Ketones, ur: 5 mg/dL — AB
Nitrite: NEGATIVE
Protein, ur: >=300 mg/dL — AB
Specific Gravity, Urine: 1.017 (ref 1.005–1.030)
WBC, UA: >50 WBC/hpf — ABNORMAL HIGH (ref 0–5)
pH: 5 (ref 5.0–8.0)

## 2020-09-14 LAB — RAPID URINE DRUG SCREEN, HOSP PERFORMED
Amphetamines: NOT DETECTED
Barbiturates: NOT DETECTED
Benzodiazepines: NOT DETECTED
Cocaine: NOT DETECTED
Opiates: POSITIVE — AB
Tetrahydrocannabinol: POSITIVE — AB

## 2020-09-14 LAB — LIPASE, BLOOD: Lipase: 94 U/L — ABNORMAL HIGH (ref 11–51)

## 2020-09-14 MED ORDER — LISINOPRIL 20 MG PO TABS
40.0000 mg | ORAL_TABLET | Freq: Every day | ORAL | Status: DC
  Administered 2020-09-14: 40 mg via ORAL
  Filled 2020-09-14: qty 2

## 2020-09-14 MED ORDER — CEPHALEXIN 500 MG PO CAPS: 500.0000 mg | ORAL_CAPSULE | Freq: Four times a day (QID) | ORAL | 0 refills | Status: DC

## 2020-09-14 MED ORDER — FENTANYL CITRATE (PF) 100 MCG/2ML IJ SOLN
100.0000 ug | INTRAMUSCULAR | Status: AC | PRN
  Administered 2020-09-14 (×2): 100 ug via INTRAVENOUS
  Filled 2020-09-14 (×2): qty 2

## 2020-09-14 MED ORDER — SODIUM CHLORIDE 0.9 % IV BOLUS
1000.0000 mL | Freq: Once | INTRAVENOUS | Status: AC
  Administered 2020-09-14: 1000 mL via INTRAVENOUS

## 2020-09-14 MED ORDER — LABETALOL HCL 5 MG/ML IV SOLN
20.0000 mg | INTRAVENOUS | Status: DC | PRN
  Administered 2020-09-14: 20 mg via INTRAVENOUS
  Filled 2020-09-14: qty 4

## 2020-09-14 MED ORDER — GLIPIZIDE 5 MG PO TABS
5.0000 mg | ORAL_TABLET | Freq: Two times a day (BID) | ORAL | Status: DC
  Filled 2020-09-14: qty 1

## 2020-09-14 MED ORDER — CARVEDILOL 12.5 MG PO TABS
25.0000 mg | ORAL_TABLET | Freq: Two times a day (BID) | ORAL | Status: DC
  Administered 2020-09-14: 25 mg via ORAL
  Filled 2020-09-14: qty 2

## 2020-09-14 MED ORDER — CEPHALEXIN 500 MG PO CAPS
1000.0000 mg | ORAL_CAPSULE | Freq: Once | ORAL | Status: AC
  Administered 2020-09-14: 1000 mg via ORAL
  Filled 2020-09-14: qty 2

## 2020-09-14 MED ORDER — ONDANSETRON HCL 4 MG/2ML IJ SOLN
4.0000 mg | Freq: Once | INTRAMUSCULAR | Status: AC
  Administered 2020-09-14: 4 mg via INTRAVENOUS
  Filled 2020-09-14: qty 2

## 2020-09-14 MED ORDER — AMLODIPINE BESYLATE 5 MG PO TABS
10.0000 mg | ORAL_TABLET | Freq: Every day | ORAL | Status: DC
  Administered 2020-09-14: 10 mg via ORAL
  Filled 2020-09-14: qty 2

## 2020-09-14 NOTE — ED Provider Notes (Signed)
[facility] DEPT Provider Note   CSN: [id number] Arrival date & time: 09/14/20  0115     History Chief Complaint  Patient presents with  . Abdominal Pain    Randy [name]. is a 40 y.o. male.  HPI He states that he has had mid upper abdomen and mid back pain, since yesterday after hospital discharge.  He presented for evaluation at 1:15 AM today.  He was available to be seen by me, and I saw him at 12:10 PM.  He states he took his usual medicine as prescribed yesterday, but has not had any yet today.  He denies nausea, vomiting, weakness or dizziness.  He states he has recurrent pancreatitis, cause unknown.  He states he does not drink alcohol.  He denies fever, chills, cough, shortness of breath, focal weakness or paresthesia.  There are no other known modifying factors.    Past Medical History:  Diagnosis Date  . HLD (hyperlipidemia)   . Hypertension   . Pancreatitis   . Type II diabetes mellitus [facility])     Patient Active Problem List   Diagnosis Date Noted  . Hypertensive crisis 09/12/2020  . Nausea & vomiting 09/12/2020  . AKI (acute kidney injury) ([place]) 09/12/2020  . GERD (gastroesophageal reflux disease) 09/12/2020  . Type 2 diabetes mellitus without complication, with long-term current use of insulin ([place])   . Pancreatitis 04/08/2020  . Hyperlipidemia 06/01/2019  . Microalbuminuria due to type 2 diabetes mellitus ([place]) 06/01/2019  . Essential hypertension 05/31/2019  . Hypertensive urgency 08/31/2015  . Uncontrolled diabetes mellitus ([place]) 08/31/2015  . Marijuana abuse 08/31/2015    Past Surgical History:  Procedure Laterality Date  . NO PAST SURGERIES         Family History  Problem Relation Age of Onset  . Hypertension Mother   . Diabetes Father   . Hypertension Father   . Diabetes Brother   . Diabetes Maternal Grandmother   . Colon cancer Neg Hx   . Esophageal cancer Neg Hx   . Rectal cancer Neg Hx   . Stomach  cancer Neg Hx     Social History   Tobacco Use  . Smoking status: Former Smoker    Packs/day: 0.33    Years: 17.00    Pack years: 5.61    Types: Cigarettes    Quit date: 12/26/2011    Years since quitting: 8.7  . Smokeless tobacco: Never Used  Vaping Use  . Vaping Use: Never used  Substance Use Topics  . Alcohol use: No    Comment: none in6-7 years  . Drug use: Yes    Types: Marijuana    Comment: Nov 2020    Home Medications Prior to Admission medications   Medication Sig Start Date End Date Taking? Authorizing Provider  amLODipine (NORVASC) 10 MG tablet Take 1 tablet (10 mg total) by mouth daily. 09/13/20 09/13/21  Geradine Girt, DO  atorvastatin (LIPITOR) 80 MG tablet Take 1 tablet (80 mg total) by mouth daily. 09/13/20 09/13/21  Geradine Girt, DO  Blood Glucose Monitoring Suppl (TRUE METRIX METER) w/Device KIT Use to check blood sugar TID. E11.65 Patient taking differently: 1 each by Other route in the morning, at noon, and at bedtime. Use to check blood sugar TID. E11.65 06/10/19   Charlott Rakes, MD  carvedilol (COREG) 25 MG tablet Take 1 tablet (25 mg total) by mouth 2 (two) times daily with a meal. 09/13/20 09/13/21  Geradine Girt, DO  glipiZIDE (GLUCOTROL) 5 MG tablet Take 1 tablet (5 mg total) by mouth 2 (two) times daily before a meal. 09/13/20 09/13/21  Eulogio Bear U, DO  glucose blood test strip Use as instructed to check TID. E11.65 Patient taking differently: 1 each by Other route in the morning, at noon, and at bedtime. Use as instructed to check TID. E11.65 06/10/19   Charlott Rakes, MD  insulin glargine (LANTUS) 100 UNIT/ML Solostar Pen Inject 26 Units into the skin daily. 09/13/20 11/20/20  Geradine Girt, DO  Insulin Pen Needle (PENTIPS) 32G X 4 MM MISC Use as directed 09/13/20   Geradine Girt, DO  Insulin Pen Needle (TRUEPLUS 5-BEVEL PEN NEEDLES) 32G X 4 MM MISC Use to inject insulin daily. Patient taking differently: 1 each by Other route daily. Use to inject insulin  daily. 06/10/19   Charlott Rakes, MD  lisinopril (ZESTRIL) 40 MG tablet Take 1 tablet (40 mg total) by mouth daily. 09/13/20 09/13/21  Geradine Girt, DO  omeprazole (PRILOSEC) 40 MG capsule Take 1 capsule (40 mg total) by mouth daily. 09/13/20   Geradine Girt, DO  ondansetron (ZOFRAN ODT) 4 MG disintegrating tablet Take 1 tablet (4 mg total) by mouth every 8 (eight) hours as needed for nausea or vomiting. 03/22/20   Hayden Rasmussen, MD  sildenafil (VIAGRA) 100 MG tablet TAKE 0.5-1 TABLETS (50-100 MG TOTAL) BY MOUTH DAILY AS NEEDED FOR ERECTILE DYSFUNCTION. Patient taking differently: Take 50-100 mg by mouth as needed for erectile dysfunction. 11/30/19 11/29/20  Nicolette Bang, DO  TRUEplus Lancets 28G MISC Use to check blood sugar three times daily. E11.65 Patient taking differently: 1 each by Other route in the morning, at noon, and at bedtime. Use to check blood sugar three times daily. E11.65 06/10/19   Charlott Rakes, MD    Allergies    Tomato  Review of Systems   Review of Systems  All other systems reviewed and are negative.   Physical Exam Updated Vital Signs BP (!) 176/109   Pulse 80   Temp 98.4 F (36.9 C) (Oral)   Resp 15   Ht '5\' 8"'  (1.727 m)   Wt 90.6 kg   SpO2 100%   BMI 30.37 kg/m   Physical Exam Vitals and nursing note reviewed.  Constitutional:      General: He is not in acute distress.    Appearance: He is well-developed. He is not ill-appearing, toxic-appearing or diaphoretic.  HENT:     Head: Normocephalic and atraumatic.     Right Ear: External ear normal.     Left Ear: External ear normal.  Eyes:     Conjunctiva/sclera: Conjunctivae normal.     Pupils: Pupils are equal, round, and reactive to light.  Neck:     Trachea: Phonation normal.  Cardiovascular:     Rate and Rhythm: Normal rate and regular rhythm.     Heart sounds: Normal heart sounds.  Pulmonary:     Effort: Pulmonary effort is normal.     Breath sounds: Normal breath sounds.   Abdominal:     General: There is no distension.     Palpations: Abdomen is soft.     Tenderness: There is no abdominal tenderness. There is no guarding.  Musculoskeletal:        General: Normal range of motion.     Cervical back: Normal range of motion and neck supple.  Skin:    General: Skin is warm and dry.     Coloration: Skin  is not jaundiced or pale.     Findings: No erythema.  Neurological:     Mental Status: He is alert and oriented to person, place, and time.     Cranial Nerves: No cranial nerve deficit.     Sensory: No sensory deficit.     Motor: No abnormal muscle tone.     Coordination: Coordination normal.  Psychiatric:        Mood and Affect: Mood normal.        Behavior: Behavior normal.        Thought Content: Thought content normal.        Judgment: Judgment normal.     ED Results / Procedures / Treatments   Labs (all labs ordered are listed, but only abnormal results are displayed) Labs Reviewed  LIPASE, BLOOD - Abnormal; Notable for the following components:      Result Value   Lipase 94 (*)    All other components within normal limits  COMPREHENSIVE METABOLIC PANEL - Abnormal; Notable for the following components:   Glucose, Bld 158 (*)    BUN 23 (*)    Creatinine, Ser 2.13 (*)    Albumin 3.2 (*)    GFR, Estimated 39 (*)    All other components within normal limits  CBC - Abnormal; Notable for the following components:   RBC 3.85 (*)    Hemoglobin 10.4 (*)    HCT 32.0 (*)    All other components within normal limits  URINALYSIS, ROUTINE W REFLEX MICROSCOPIC - Abnormal; Notable for the following components:   APPearance CLOUDY (*)    Glucose, UA 150 (*)    Ketones, ur 5 (*)    Protein, ur >=300 (*)    Leukocytes,Ua MODERATE (*)    WBC, UA >50 (*)    Bacteria, UA RARE (*)    Non Squamous Epithelial 0-5 (*)    All other components within normal limits  RAPID URINE DRUG SCREEN, HOSP PERFORMED - Abnormal; Notable for the following components:    Opiates POSITIVE (*)    Tetrahydrocannabinol POSITIVE (*)    All other components within normal limits  URINE CULTURE    EKG None  Radiology No results found.  Procedures Procedures   Medications Ordered in ED Medications  amLODipine (NORVASC) tablet 10 mg (10 mg Oral Given 09/14/20 1304)  carvedilol (COREG) tablet 25 mg (25 mg Oral Given 09/14/20 1303)  glipiZIDE (GLUCOTROL) tablet 5 mg (has no administration in time range)  lisinopril (ZESTRIL) tablet 40 mg (40 mg Oral Given 09/14/20 1303)  labetalol (NORMODYNE) injection 20 mg (20 mg Intravenous Given 09/14/20 1500)  fentaNYL (SUBLIMAZE) injection 100 mcg (100 mcg Intravenous Given 09/14/20 1517)  ondansetron (ZOFRAN) injection 4 mg (4 mg Intravenous Given 09/14/20 1259)  sodium chloride 0.9 % bolus 1,000 mL (0 mLs Intravenous Stopped 09/14/20 1540)  cephALEXin (KEFLEX) capsule 1,000 mg (1,000 mg Oral Given 09/14/20 1518)    ED Course  I have reviewed the triage vital signs and the nursing notes.  Pertinent labs & imaging results that were available during my care of the patient were reviewed by me and considered in my medical decision making (see chart for details).    MDM Rules/Calculators/A&P                           Patient Vitals for the past 24 hrs:  BP Temp Temp src Pulse Resp SpO2 Height Weight  09/14/20 1530 (!) 176/109 -- --  80 15 100 % -- --  09/14/20 1518 (!) 161/90 -- -- 68 (!) 8 100 % -- --  09/14/20 1414 (!) 168/101 -- -- 76 18 98 % -- --  09/14/20 1218 (!) 209/96 -- -- 81 18 98 % -- --  09/14/20 0908 (!) 167/113 -- -- 69 18 100 % -- --  09/14/20 0611 (!) 161/110 -- -- 78 18 100 % -- --  09/14/20 0132 (!) 159/100 98.4 F (36.9 C) Oral 76 18 99 % -- --  09/14/20 0127 -- -- -- -- -- -- '5\' 8"'  (1.727 m) 90.6 kg  09/14/20 0125 -- -- -- -- -- 98 % -- --    2:54 PM Reevaluation with update and discussion. After initial assessment and treatment, an updated evaluation reveals he is feeling better at this time.  He is  hungry and thirsty and would like to try eating and drinking.Daleen Bo   3:40 PM-is eating and drinking well.  He has eaten everything-after having drank about 16 ounces of fluid.  He prefers to stay here another hour or so to make sure he is doing okay.  I will send a prescription for UTI antibiotic to his pharmacy.  Medical Decision Making:  This patient is presenting for evaluation of abdominal back pain following hospital discharge yesterday, which does require a range of treatment options, and is a complaint that involves a moderate risk of morbidity and mortality. The differential diagnoses include pancreatitis, nonspecific pain, medication noncompliance. I decided to review old records, and in summary middle-aged male presenting with recurrent abdominal pain, following hospital discharge yesterday.  He returned to the ED less than 24 hours after discharge.  He had been treated for abdominal pain, and hypertensive urgency during the hospitalization for 2 days..  I did not require additional historical information from anyone.  Clinical Laboratory Tests Ordered, included CBC, Metabolic panel, Urinalysis and Urine drug screen, alcohol level. Review indicates normal except opiates and tetrahydrocannabinol and urine, glucose high, BUN high, creatinine high, urine abnormalities which are nonspecific. Radiologic Tests Ordered, included none.      Critical Interventions-clinical evaluation, laboratory testing, IV fluids, medication treatment, observation and reassessment  After These Interventions, the Patient was reevaluated and was found with nonspecific abdominal back pain.  Urinalysis is abnormal.  Blood pressure improved with treatment of his usual medications, and a single dose of labetalol.  His mild renal insufficiency, new from yesterday, which will likely improve with usual diet.  He does take lisinopril so this might be watched closely, and he should see his PCP next week for  checkup.  CRITICAL CARE-no Performed by: Daleen Bo  Nursing Notes Reviewed/ Care Coordinated Applicable Imaging Reviewed Interpretation of Laboratory Data incorporated into ED treatment   Plan-disposition by oncoming provider team following a short period of observation.  Patient could be admitted if needed.    Final Clinical Impression(s) / ED Diagnoses Final diagnoses:  Generalized abdominal pain  Urinary tract infection without hematuria, site unspecified  Hypertension, unspecified type  Renal insufficiency    Rx / DC Orders ED Discharge Orders    None       Daleen Bo, MD 09/14/20 1546

## 2020-09-14 NOTE — ED Notes (Addendum)
Patient upset about not getting prescripted pain meds at home.Patient refused to sign ED Discharge.

## 2020-09-14 NOTE — ED Provider Notes (Signed)
MSE was initiated and I personally evaluated the patient and placed orders (if any) at  1:46 AM on Sep 14, 2020.  Patient here with epigastric abdominal pain.  Noted to be very hypertensive with EMS.  EMS gave of fentanyl.  BP improved, but still high. Patient reports that he is taking his BP meds.  Was just discharged yesterday for hypertensive emergency.  Sleeping, but easily arousable. Mild epigastric tenderness  Discussed with patient that their care has been initiated.   They are counseled that they will need to remain in the ED until the completion of their workup, including full H&P and results of any tests.  Risks of leaving the emergency department prior to completion of treatment were discussed. Patient was advised to inform ED staff if they are leaving before their treatment is complete. The patient acknowledged these risks and time was allowed for questions.    The patient appears stable so that the remainder of the MSE may be completed by another provider.    Roxy Horseman, PA-C 09/14/20 Geron[place] Boot    Glynn Octave, MD 09/14/20 [phone]

## 2020-09-14 NOTE — ED Triage Notes (Signed)
Pt arrives EMS from home with c/o RUQ abdominal pain. Pt has hx of pancreatitis. Recently admitted with hyperensive urgency. Initial BP 230/130. fentanyl.

## 2020-09-14 NOTE — ED Provider Notes (Signed)
Patient feeling better.  Discharge.  History of recurrent pancreatitis.  Tolerating p.o.  No evidence of acute pancreatitis at this time.   Virgina Norfolk, DO 09/14/20 1700

## 2020-09-14 NOTE — Telephone Encounter (Signed)
Transition Care Management Unsuccessful Follow-up Telephone Call  Date of discharge and from where:  09/13/2020, [facility]   Patient currently in ED - 09/14/2020

## 2020-09-15 ENCOUNTER — Telehealth: Payer: Self-pay

## 2020-09-15 NOTE — Telephone Encounter (Signed)
Transition Care Management Unsuccessful Follow-up Telephone Call  Date of discharge and from where:  09/13/2020, [facility]    Patient is momentarily  in ED - 09/14/2020/ not discharged yet

## 2020-09-16 ENCOUNTER — Encounter (HOSPITAL_COMMUNITY): Payer: Self-pay | Admitting: Emergency Medicine

## 2020-09-16 ENCOUNTER — Emergency Department (HOSPITAL_COMMUNITY)
Admission: EM | Admit: 2020-09-16 | Discharge: 2020-09-16 | Disposition: A | Discharge status: Home / Self Care | Payer: Self-pay | Attending: Emergency Medicine | Admitting: Emergency Medicine

## 2020-09-16 ENCOUNTER — Other Ambulatory Visit: Payer: Self-pay

## 2020-09-16 DIAGNOSIS — Z794 Long term (current) use of insulin: Secondary | ICD-10-CM | POA: Insufficient documentation

## 2020-09-16 DIAGNOSIS — R1084 Generalized abdominal pain: Secondary | ICD-10-CM

## 2020-09-16 DIAGNOSIS — Z7984 Long term (current) use of oral hypoglycemic drugs: Secondary | ICD-10-CM | POA: Insufficient documentation

## 2020-09-16 DIAGNOSIS — I10 Essential (primary) hypertension: Secondary | ICD-10-CM | POA: Insufficient documentation

## 2020-09-16 DIAGNOSIS — R1013 Epigastric pain: Secondary | ICD-10-CM | POA: Insufficient documentation

## 2020-09-16 DIAGNOSIS — M545 Low back pain, unspecified: Secondary | ICD-10-CM | POA: Insufficient documentation

## 2020-09-16 DIAGNOSIS — E119 Type 2 diabetes mellitus without complications: Secondary | ICD-10-CM | POA: Insufficient documentation

## 2020-09-16 DIAGNOSIS — Z79899 Other long term (current) drug therapy: Secondary | ICD-10-CM | POA: Insufficient documentation

## 2020-09-16 DIAGNOSIS — Z87891 Personal history of nicotine dependence: Secondary | ICD-10-CM | POA: Insufficient documentation

## 2020-09-16 LAB — COMPREHENSIVE METABOLIC PANEL
ALT: 16 U/L (ref 0–44)
AST: 26 U/L (ref 15–41)
Albumin: 3.1 g/dL — ABNORMAL LOW (ref 3.5–5.0)
Alkaline Phosphatase: 85 U/L (ref 38–126)
Anion gap: 7 (ref 5–15)
BUN: 15 mg/dL (ref 6–20)
CO2: 25 mmol/L (ref 22–32)
Calcium: 9 mg/dL (ref 8.9–10.3)
Chloride: 104 mmol/L (ref 98–111)
Creatinine, Ser: 1.42 mg/dL — ABNORMAL HIGH (ref 0.61–1.24)
GFR, Estimated: >60 mL/min (ref >60)
Glucose, Bld: 133 mg/dL — ABNORMAL HIGH (ref 70–99)
Potassium: 4 mmol/L (ref 3.5–5.1)
Sodium: 136 mmol/L (ref 135–145)
Total Bilirubin: 0.6 mg/dL (ref 0.3–1.2)
Total Protein: 7.3 g/dL (ref 6.5–8.1)

## 2020-09-16 LAB — CBC
HCT: 34.6 % — ABNORMAL LOW (ref 39.0–52.0)
Hemoglobin: 10.9 g/dL — ABNORMAL LOW (ref 13.0–17.0)
MCH: 26.7 pg (ref 26.0–34.0)
MCHC: 31.5 g/dL (ref 30.0–36.0)
MCV: 84.8 fL (ref 80.0–100.0)
Platelets: 313 10*3/uL (ref 150–400)
RBC: 4.08 MIL/uL — ABNORMAL LOW (ref 4.22–5.81)
RDW: 12.4 % (ref 11.5–15.5)
WBC: 7.2 10*3/uL (ref 4.0–10.5)
nRBC: 0 % (ref 0.0–0.2)

## 2020-09-16 LAB — URINE CULTURE

## 2020-09-16 LAB — LIPASE, BLOOD: Lipase: 254 U/L — ABNORMAL HIGH (ref 11–51)

## 2020-09-16 MED ORDER — CAPSAICIN 0.025 % EX CREA
TOPICAL_CREAM | Freq: Once | CUTANEOUS | Status: AC
  Filled 2020-09-16: qty 60

## 2020-09-16 MED ORDER — MORPHINE SULFATE (PF) 4 MG/ML IV SOLN: 4.0000 mg | Freq: Once | INTRAVENOUS | Status: DC

## 2020-09-16 MED ORDER — HYDROCODONE-ACETAMINOPHEN 5-325 MG PO TABS
1.0000 | ORAL_TABLET | Freq: Once | ORAL | Status: AC
  Administered 2020-09-16: 1 via ORAL
  Filled 2020-09-16: qty 1

## 2020-09-16 MED ORDER — HYDROCODONE-ACETAMINOPHEN 5-325 MG PO TABS: 2.0000 | ORAL_TABLET | ORAL | 0 refills | Status: DC | PRN

## 2020-09-16 NOTE — Discharge Instructions (Addendum)
Testing today does not indicate what is causing your symptoms.  We are giving you the tube of capsaicin to use, 3 times a day for help with abdominal discomfort.  We will give you a few pain pills which have been sent to your pharmacy to help.  Both of these treatments cannot fix your problem permanently.  It is important to follow-up with the gastroenterologist, we have been seeing for further care and treatment.  Call her office on Monday morning for an appointment.  Make sure you take all your medicines as directed.  Stay on a low-sodium diet to help keep your blood pressure low.  Try to avoid using marijuana as it can cause abdominal discomfort and gastrointestinal symptoms.  Follow-up with your primary care medical provider for further care and treatment as usual.

## 2020-09-16 NOTE — ED Provider Notes (Signed)
[facility] EMERGENCY DEPARTMENT Provider Note   CSN: [id number] Arrival date & time: 09/16/20  0900     History Chief Complaint  Randy [name] presents with  . Abdominal Pain  . Back Pain    Randy [name]. is a 40 y.o. male with past medical history of hyperlipidemia, hypertension, recurrent pancreatitis, type 2 diabetes that presents to the emergency department today for the third time in one week for similar complaints of generalized abdominal pain and lower back pain.  Randy [name] states that abdominal pain is generalized to his lower quadrants, does not radiate to his back but Randy [name] is also having lower back pain.  States that the pain will come and go, this time this episode has lasted for 2 weeks without any relief.  Randy [name] states the only thing that makes it better is hot showers and baths.  States that heat makes the pain completely go away and then Randy [name] will come out of heat and then start having pain again.  Randy [name] does smoke about 2 g of marijuana a day.  Denies any nausea, vomiting, diarrhea, dysuria, scrotal pain or penile pain.  Denies any trauma to his back or his abdomen, denies any fevers or cough or URI symptoms.   Per chart review Randy [name] presented in January of this year for pancreatitis, then again 4 days ago for pancreatitis, was admitted for hypertensive crisis, came back the day after discharge for complaints of mid upper abdominal and back pain, had no evidence of acute pancreatitis and was discharged home.  Presenting back again today for similar symptoms.  Randy [name] tells me that Randy [name] has had similar symptoms like this for the last 2 years, states that Randy [name] constantly is asking for prescription of pain medications, however never gets any.  Asking for some again today.  Randy [name] does admit to smoking 2 g of marijuana daily, no other substance use.  No alcohol use.  Does admit to eating fatty foods daily.   HPI     Past Medical History:  Diagnosis Date  . HLD  (hyperlipidemia)   . Hypertension   . Pancreatitis   . Type II diabetes mellitus [facility])     Randy [name] Active Problem List   Diagnosis Date Noted  . Hypertensive crisis 09/12/2020  . Nausea & vomiting 09/12/2020  . AKI (acute kidney injury) ([place]) 09/12/2020  . GERD (gastroesophageal reflux disease) 09/12/2020  . Type 2 diabetes mellitus without complication, with long-term current use of insulin ([place])   . Pancreatitis 04/08/2020  . Hyperlipidemia 06/01/2019  . Microalbuminuria due to type 2 diabetes mellitus ([place]) 06/01/2019  . Essential hypertension 05/31/2019  . Hypertensive urgency 08/31/2015  . Uncontrolled diabetes mellitus ([place]) 08/31/2015  . Marijuana abuse 08/31/2015    Past Surgical History:  Procedure Laterality Date  . NO PAST SURGERIES         Family History  Problem Relation Age of Onset  . Hypertension Mother   . Diabetes Father   . Hypertension Father   . Diabetes Brother   . Diabetes Maternal Grandmother   . Colon cancer Neg Hx   . Esophageal cancer Neg Hx   . Rectal cancer Neg Hx   . Stomach cancer Neg Hx     Social History   Tobacco Use  . Smoking status: Former Smoker    Packs/day: 0.33    Years: 17.00    Pack years: 5.61    Types: Cigarettes    Quit date: 12/26/2011    Years since  quitting: 8.7  . Smokeless tobacco: Never Used  Vaping Use  . Vaping Use: Never used  Substance Use Topics  . Alcohol use: No    Comment: none in6-7 years  . Drug use: Yes    Types: Marijuana    Comment: Nov 2020    Home Medications Prior to Admission medications   Medication Sig Start Date End Date Taking? Authorizing Provider  HYDROcodone-acetaminophen (NORCO) 5-325 MG tablet Take 2 tablets by mouth every 4 (four) hours as needed. 09/16/20  Yes Daleen Bo, MD  amLODipine (NORVASC) 10 MG tablet Take 1 tablet (10 mg total) by mouth daily. 09/13/20 09/13/21  Geradine Girt, DO  atorvastatin (LIPITOR) 80 MG tablet Take 1 tablet (80 mg total) by mouth daily.  09/13/20 09/13/21  Geradine Girt, DO  Blood Glucose Monitoring Suppl (TRUE METRIX METER) w/Device KIT Use to check blood sugar TID. E11.65 Randy [name] taking differently: 1 each by Other route in the morning, at noon, and at bedtime. Use to check blood sugar TID. E11.65 06/10/19   Charlott Rakes, MD  carvedilol (COREG) 25 MG tablet Take 1 tablet (25 mg total) by mouth 2 (two) times daily with a meal. 09/13/20 09/13/21  Geradine Girt, DO  cephALEXin (KEFLEX) 500 MG capsule Take 1 capsule (500 mg total) by mouth 4 (four) times daily. 09/14/20   Daleen Bo, MD  glipiZIDE (GLUCOTROL) 5 MG tablet Take 1 tablet (5 mg total) by mouth 2 (two) times daily before a meal. 09/13/20 09/13/21  Eulogio Bear U, DO  glucose blood test strip Use as instructed to check TID. E11.65 Randy [name] taking differently: 1 each by Other route in the morning, at noon, and at bedtime. Use as instructed to check TID. E11.65 06/10/19   Charlott Rakes, MD  insulin glargine (LANTUS) 100 UNIT/ML Solostar Pen Inject 26 Units into the skin daily. 09/13/20 11/20/20  Geradine Girt, DO  Insulin Pen Needle (PENTIPS) 32G X 4 MM MISC Use as directed 09/13/20   Geradine Girt, DO  Insulin Pen Needle (TRUEPLUS 5-BEVEL PEN NEEDLES) 32G X 4 MM MISC Use to inject insulin daily. Randy [name] taking differently: 1 each by Other route daily. Use to inject insulin daily. 06/10/19   Charlott Rakes, MD  lisinopril (ZESTRIL) 40 MG tablet Take 1 tablet (40 mg total) by mouth daily. 09/13/20 09/13/21  Geradine Girt, DO  omeprazole (PRILOSEC) 40 MG capsule Take 1 capsule (40 mg total) by mouth daily. 09/13/20   Geradine Girt, DO  ondansetron (ZOFRAN ODT) 4 MG disintegrating tablet Take 1 tablet (4 mg total) by mouth every 8 (eight) hours as needed for nausea or vomiting. 03/22/20   Hayden Rasmussen, MD  sildenafil (VIAGRA) 100 MG tablet TAKE 0.5-1 TABLETS (50-100 MG TOTAL) BY MOUTH DAILY AS NEEDED FOR ERECTILE DYSFUNCTION. Randy [name] taking differently: Take 50-100 mg by mouth as  needed for erectile dysfunction. 11/30/19 11/29/20  Nicolette Bang, DO  TRUEplus Lancets 28G MISC Use to check blood sugar three times daily. E11.65 Randy [name] taking differently: 1 each by Other route in the morning, at noon, and at bedtime. Use to check blood sugar three times daily. E11.65 06/10/19   Charlott Rakes, MD    Allergies    Tomato  Review of Systems   Review of Systems  Constitutional: Negative for chills, diaphoresis, fatigue and fever.  HENT: Negative for congestion, sore throat and trouble swallowing.   Eyes: Negative for pain and visual disturbance.  Respiratory: Negative for cough, shortness of breath  and wheezing.   Cardiovascular: Negative for chest pain, palpitations and leg swelling.  Gastrointestinal: Positive for abdominal pain. Negative for abdominal distention, diarrhea, nausea and vomiting.  Genitourinary: Negative for difficulty urinating.  Musculoskeletal: Positive for back pain. Negative for neck pain and neck stiffness.  Skin: Negative for pallor.  Neurological: Negative for dizziness, speech difficulty, weakness and headaches.  Psychiatric/Behavioral: Negative for confusion.    Physical Exam Updated Vital Signs BP (!) 170/101 (BP Location: Right Arm)   Pulse 72   Temp 98.8 F (37.1 C)   Resp 18   SpO2 100%   Physical Exam Constitutional:      General: Randy [name] is not in acute distress.    Appearance: Normal appearance. Randy [name] is not ill-appearing, toxic-appearing or diaphoretic.  HENT:     Mouth/Throat:     Mouth: Mucous membranes are moist.     Pharynx: Oropharynx is clear.  Eyes:     General: No scleral icterus.    Extraocular Movements: Extraocular movements intact.     Pupils: Pupils are equal, round, and reactive to light.  Cardiovascular:     Rate and Rhythm: Normal rate and regular rhythm.     Pulses: Normal pulses.     Heart sounds: Normal heart sounds.  Pulmonary:     Effort: Pulmonary effort is normal. No respiratory distress.      Breath sounds: Normal breath sounds. No stridor. No wheezing, rhonchi or rales.  Chest:     Chest wall: No tenderness.  Abdominal:     General: Abdomen is flat. There is no distension.     Palpations: Abdomen is soft.     Tenderness: There is abdominal tenderness in the right lower quadrant, suprapubic area and left lower quadrant. There is no guarding or rebound.     Comments: Generalized abdominal tenderness to lower quadrants and suprapubic region, no guarding.  Musculoskeletal:        General: No swelling or tenderness. Normal range of motion.     Cervical back: Normal range of motion and neck supple. No rigidity.       Back:     Right lower leg: No edema.     Left lower leg: No edema.     Comments: Randy [name] with midthoracic back pain, crosses midline.  No crepitus or bony tenderness felt.  No objective numbness.  Randy [name] states that this is the primary location for when Randy [name] has this type of pain.  Skin:    General: Skin is warm and dry.     Capillary Refill: Capillary refill takes less than 2 seconds.     Coloration: Skin is not pale.  Neurological:     General: No focal deficit present.     Mental Status: Randy [name] is alert and oriented to person, place, and time.  Psychiatric:        Mood and Affect: Mood normal.        Behavior: Behavior normal.     ED Results / Procedures / Treatments   Labs (all labs ordered are listed, but only abnormal results are displayed) Labs Reviewed  LIPASE, BLOOD - Abnormal; Notable for the following components:      Result Value   Lipase 254 (*)    All other components within normal limits  COMPREHENSIVE METABOLIC PANEL - Abnormal; Notable for the following components:   Glucose, Bld 133 (*)    Creatinine, Ser 1.42 (*)    Albumin 3.1 (*)    All other components within normal limits  CBC -  Abnormal; Notable for the following components:   RBC 4.08 (*)    Hemoglobin 10.9 (*)    HCT 34.6 (*)    All other components within normal limits   URINALYSIS, ROUTINE W REFLEX MICROSCOPIC    EKG None  Radiology No results found.  Procedures Procedures   Medications Ordered in ED Medications  morphine 4 MG/ML injection 4 mg (0 mg Intravenous Hold 09/16/20 1138)  HYDROcodone-acetaminophen (NORCO/VICODIN) 5-325 MG per tablet 1 tablet (has no administration in time range)  capsaicin (ZOSTRIX) 0.025 % cream ( Topical Given 09/16/20 1131)    ED Course  I have reviewed the triage vital signs and the nursing notes.  Pertinent labs & imaging results that were available during my care of the Randy [name] were reviewed by me and considered in my medical decision making (see chart for details).    MDM Rules/Calculators/A&P                          Apolinar R Diego Ulbricht. is a 40 y.o. male with past medical history of hyperlipidemia, hypertension, recurrent pancreatitis, type 2 diabetes that presents to the emergency department today for the third time in one week for similar complaints of generalized abdominal pain and lower back pain.  Randy [name] has been having the symptoms on and off for 2 years.  Had a negative CT angio chest abdomen pelvis dissection study done, no evidence of aneurysmal dilation on this.  No evidence of acute or active process within chest abdomen or pelvis, no evidence of pancreatitis.  The studies were done 3 days ago.  Will obtain basic labs and pain control at this time, will also initiate Capsaicin cream as I do suspect that there is a component of marijuana related illness here since Randy [name] states that only heat makes him feel better.  Do not think it is necessary to obtain repeat CT imaging at this time, however will wait until labs are resulted.  Pt care was handed off to Dr. Eulis Foster. Complete history and physical and current plan have been communicated.  Please refer to their note for the remainder of ED care and ultimate disposition.  I discussed this case with my attending physician who cosigned this note including  Randy [name]'s presenting symptoms, physical exam, and planned diagnostics and interventions. Attending physician stated agreement with plan or made changes to plan which were implemented.   Attending physician assessed Randy [name] at bedside.   Final Clinical Impression(s) / ED Diagnoses Final diagnoses:  Generalized abdominal pain    Rx / DC Orders ED Discharge Orders         Ordered    HYDROcodone-acetaminophen (NORCO) 5-325 MG tablet  Every 4 hours PRN        09/16/20 1139           Alfredia Client, PA-C 09/16/20 1141    Blanchie Dessert, MD 09/17/20 [phone]

## 2020-09-16 NOTE — ED Provider Notes (Signed)
  Face-to-face evaluation   History: Patient presents for ongoing abdominal pain, despite evaluations and treatment multiple times.  He was hospitalized once admitted in the ED as well.  He has had comprehensive evaluations.  Most recently in the ED, 2 days ago he was diagnosed with nonspecific abdominal pain, and possible UTI, and was started on cephalexin.  Urine culture grew multiple species indicating not a probable urinary tract infection.  Notably when discharged from the ED, 2 days ago he refused to sign discharge papers because he wanted narcotics which were not offered.  I saw him initially and he was discharged by Dr. Lockie Mola.  He has a history of medication noncompliance.  He continues to use marijuana, smoking daily.  Patient has chronic intermittent symptoms, and has had extensive prior evaluations by GI as well as ED providers.  He had an MRI of the abdomen, 2021 but did not show acute abnormalities.  Today the patient presents for evaluation of ongoing abdominal and back pain.  This is his typical ongoing pain syndrome that has been present for at least a week.  He states that he has not followed up with GI, but is not sure why.  He states he is talked to his PCP for advice on the pain but that providers are not able to diagnose his condition.  He states he continues to not drink alcohol but does use marijuana daily.  Physical exam: Alert, calm, cooperative.  Abdomen is not distended.  His appearance is nontoxic.  Medical screening examination/treatment/procedure(s) were conducted as a shared visit with non-physician practitioner(s) and myself.  I personally evaluated the patient during the encounter    Mancel Bale, MD 09/16/20 2014

## 2020-09-16 NOTE — ED Triage Notes (Signed)
Reports generalized abd pain and lower back pain.  States he was admitted earlier in the week for abd pain and HTN and also seen at [facility] 2 days ago for same.  Denies nausea, vomiting, and diarrhea.  States he did vomit earlier in the week.  Hx of pancreatitis.    Took BP medication this morning.

## 2020-09-17 ENCOUNTER — Emergency Department (HOSPITAL_COMMUNITY): Payer: Self-pay

## 2020-09-17 ENCOUNTER — Encounter (HOSPITAL_COMMUNITY): Payer: Self-pay

## 2020-09-17 ENCOUNTER — Observation Stay (HOSPITAL_COMMUNITY)
Admission: EM | Admit: 2020-09-17 | Discharge: 2020-09-17 | Disposition: A | Discharge status: Home / Self Care | Payer: Self-pay | Attending: Internal Medicine | Admitting: Internal Medicine

## 2020-09-17 DIAGNOSIS — R8290 Unspecified abnormal findings in urine: Secondary | ICD-10-CM | POA: Diagnosis present

## 2020-09-17 DIAGNOSIS — E1165 Type 2 diabetes mellitus with hyperglycemia: Secondary | ICD-10-CM | POA: Insufficient documentation

## 2020-09-17 DIAGNOSIS — I10 Essential (primary) hypertension: Secondary | ICD-10-CM | POA: Insufficient documentation

## 2020-09-17 DIAGNOSIS — K219 Gastro-esophageal reflux disease without esophagitis: Secondary | ICD-10-CM | POA: Diagnosis present

## 2020-09-17 DIAGNOSIS — F1210 Cannabis abuse, uncomplicated: Secondary | ICD-10-CM | POA: Diagnosis present

## 2020-09-17 DIAGNOSIS — I160 Hypertensive urgency: Secondary | ICD-10-CM | POA: Diagnosis present

## 2020-09-17 DIAGNOSIS — Z794 Long term (current) use of insulin: Secondary | ICD-10-CM | POA: Insufficient documentation

## 2020-09-17 DIAGNOSIS — Z79899 Other long term (current) drug therapy: Secondary | ICD-10-CM | POA: Insufficient documentation

## 2020-09-17 DIAGNOSIS — Z7984 Long term (current) use of oral hypoglycemic drugs: Secondary | ICD-10-CM | POA: Insufficient documentation

## 2020-09-17 DIAGNOSIS — K859 Acute pancreatitis without necrosis or infection, unspecified: Principal | ICD-10-CM | POA: Insufficient documentation

## 2020-09-17 DIAGNOSIS — R112 Nausea with vomiting, unspecified: Secondary | ICD-10-CM | POA: Diagnosis present

## 2020-09-17 DIAGNOSIS — Z87891 Personal history of nicotine dependence: Secondary | ICD-10-CM | POA: Insufficient documentation

## 2020-09-17 DIAGNOSIS — R109 Unspecified abdominal pain: Secondary | ICD-10-CM

## 2020-09-17 DIAGNOSIS — Z20822 Contact with and (suspected) exposure to covid-19: Secondary | ICD-10-CM | POA: Insufficient documentation

## 2020-09-17 LAB — HEMOGLOBIN A1C
Hgb A1c MFr Bld: 10.3 % — ABNORMAL HIGH (ref 4.8–5.6)
Mean Plasma Glucose: 248.91 mg/dL

## 2020-09-17 LAB — URINALYSIS, ROUTINE W REFLEX MICROSCOPIC
Bacteria, UA: NONE SEEN
Bilirubin Urine: NEGATIVE
Glucose, UA: 150 mg/dL — AB
Ketones, ur: 20 mg/dL — AB
Leukocytes,Ua: NEGATIVE
Nitrite: NEGATIVE
Protein, ur: >=300 mg/dL — AB
Specific Gravity, Urine: 1.031 — ABNORMAL HIGH (ref 1.005–1.030)
pH: 6 (ref 5.0–8.0)

## 2020-09-17 LAB — COMPREHENSIVE METABOLIC PANEL
ALT: 17 U/L (ref 0–44)
AST: 23 U/L (ref 15–41)
Albumin: 3.1 g/dL — ABNORMAL LOW (ref 3.5–5.0)
Alkaline Phosphatase: 86 U/L (ref 38–126)
Anion gap: 8 (ref 5–15)
BUN: 15 mg/dL (ref 6–20)
CO2: 26 mmol/L (ref 22–32)
Calcium: 8.8 mg/dL — ABNORMAL LOW (ref 8.9–10.3)
Chloride: 100 mmol/L (ref 98–111)
Creatinine, Ser: 1.37 mg/dL — ABNORMAL HIGH (ref 0.61–1.24)
GFR, Estimated: >60 mL/min (ref >60)
Glucose, Bld: 204 mg/dL — ABNORMAL HIGH (ref 70–99)
Potassium: 3.7 mmol/L (ref 3.5–5.1)
Sodium: 134 mmol/L — ABNORMAL LOW (ref 135–145)
Total Bilirubin: 0.6 mg/dL (ref 0.3–1.2)
Total Protein: 7.1 g/dL (ref 6.5–8.1)

## 2020-09-17 LAB — URINALYSIS, COMPLETE (UACMP) WITH MICROSCOPIC
Bacteria, UA: NONE SEEN
Bilirubin Urine: NEGATIVE
Glucose, UA: 150 mg/dL — AB
Ketones, ur: 20 mg/dL — AB
Leukocytes,Ua: NEGATIVE
Nitrite: NEGATIVE
Protein, ur: >=300 mg/dL — AB
Specific Gravity, Urine: 1.032 — ABNORMAL HIGH (ref 1.005–1.030)
pH: 6 (ref 5.0–8.0)

## 2020-09-17 LAB — CBC
HCT: 34 % — ABNORMAL LOW (ref 39.0–52.0)
Hemoglobin: 11.1 g/dL — ABNORMAL LOW (ref 13.0–17.0)
MCH: 27.1 pg (ref 26.0–34.0)
MCHC: 32.6 g/dL (ref 30.0–36.0)
MCV: 83.1 fL (ref 80.0–100.0)
Platelets: 314 10*3/uL (ref 150–400)
RBC: 4.09 MIL/uL — ABNORMAL LOW (ref 4.22–5.81)
RDW: 12.4 % (ref 11.5–15.5)
WBC: 6.4 10*3/uL (ref 4.0–10.5)
nRBC: 0 % (ref 0.0–0.2)

## 2020-09-17 LAB — SARS CORONAVIRUS 2 (TAT 6-24 HRS): SARS Coronavirus 2: NEGATIVE

## 2020-09-17 LAB — CBG MONITORING, ED: Glucose-Capillary: 188 mg/dL — ABNORMAL HIGH (ref 70–99)

## 2020-09-17 LAB — LIPID PANEL
Cholesterol: 326 mg/dL — ABNORMAL HIGH (ref 0–200)
HDL: 35 mg/dL — ABNORMAL LOW (ref >40)
LDL Cholesterol: 253 mg/dL — ABNORMAL HIGH (ref 0–99)
Total CHOL/HDL Ratio: 9.3 RATIO
Triglycerides: 189 mg/dL — ABNORMAL HIGH (ref <150)
VLDL: 38 mg/dL (ref 0–40)

## 2020-09-17 LAB — LIPASE, BLOOD: Lipase: 263 U/L — ABNORMAL HIGH (ref 11–51)

## 2020-09-17 MED ORDER — ACETAMINOPHEN 650 MG RE SUPP: 650.0000 mg | Freq: Four times a day (QID) | RECTAL | Status: DC | PRN

## 2020-09-17 MED ORDER — INSULIN ASPART 100 UNIT/ML IJ SOLN
0.0000 [IU] | Freq: Four times a day (QID) | INTRAMUSCULAR | Status: DC
  Administered 2020-09-17: 4 [IU] via SUBCUTANEOUS

## 2020-09-17 MED ORDER — HYDROMORPHONE HCL 1 MG/ML IJ SOLN
1.0000 mg | Freq: Once | INTRAMUSCULAR | Status: AC
Start: 2020-09-17 — End: 2020-09-17
  Administered 2020-09-17: 1 mg via INTRAVENOUS
  Filled 2020-09-17: qty 1

## 2020-09-17 MED ORDER — PANTOPRAZOLE SODIUM 40 MG IV SOLR
40.0000 mg | Freq: Every day | INTRAVENOUS | Status: DC
  Administered 2020-09-17: 40 mg via INTRAVENOUS
  Filled 2020-09-17: qty 40

## 2020-09-17 MED ORDER — CLONIDINE HCL 0.3 MG/24HR TD PTWK
0.3000 mg | MEDICATED_PATCH | TRANSDERMAL | Status: DC
  Filled 2020-09-17: qty 1

## 2020-09-17 MED ORDER — METOPROLOL TARTRATE 5 MG/5ML IV SOLN
5.0000 mg | Freq: Four times a day (QID) | INTRAVENOUS | Status: DC
  Administered 2020-09-17: 5 mg via INTRAVENOUS
  Filled 2020-09-17: qty 5

## 2020-09-17 MED ORDER — HYDROMORPHONE HCL 1 MG/ML IJ SOLN: 1.0000 mg | INTRAMUSCULAR | Status: DC | PRN

## 2020-09-17 MED ORDER — ACETAMINOPHEN 325 MG PO TABS: 650.0000 mg | ORAL_TABLET | Freq: Four times a day (QID) | ORAL | Status: DC | PRN

## 2020-09-17 MED ORDER — ONDANSETRON HCL 4 MG PO TABS: 4.0000 mg | ORAL_TABLET | Freq: Four times a day (QID) | ORAL | Status: DC | PRN

## 2020-09-17 MED ORDER — IOHEXOL 350 MG/ML SOLN
75.0000 mL | Freq: Once | INTRAVENOUS | Status: AC | PRN
  Administered 2020-09-17: 75 mL via INTRAVENOUS

## 2020-09-17 MED ORDER — ONDANSETRON HCL 4 MG/2ML IJ SOLN: 4.0000 mg | Freq: Four times a day (QID) | INTRAMUSCULAR | Status: DC | PRN

## 2020-09-17 MED ORDER — HYDRALAZINE HCL 20 MG/ML IJ SOLN
10.0000 mg | Freq: Four times a day (QID) | INTRAMUSCULAR | Status: DC | PRN
  Administered 2020-09-17: 10 mg via INTRAVENOUS
  Filled 2020-09-17: qty 1

## 2020-09-17 MED ORDER — LACTATED RINGERS IV BOLUS
1000.0000 mL | Freq: Once | INTRAVENOUS | Status: AC
  Administered 2020-09-17: 1000 mL via INTRAVENOUS

## 2020-09-17 MED ORDER — LACTATED RINGERS IV SOLN: INTRAVENOUS | Status: DC

## 2020-09-17 MED ORDER — ATORVASTATIN CALCIUM 80 MG PO TABS
80.0000 mg | ORAL_TABLET | Freq: Every day | ORAL | Status: DC
  Administered 2020-09-17: 80 mg via ORAL
  Filled 2020-09-17: qty 1

## 2020-09-17 MED ORDER — DROPERIDOL 2.5 MG/ML IJ SOLN
2.5000 mg | Freq: Once | INTRAMUSCULAR | Status: AC
  Administered 2020-09-17: 2.5 mg via INTRAVENOUS
  Filled 2020-09-17: qty 2

## 2020-09-17 MED ORDER — ENOXAPARIN SODIUM 40 MG/0.4ML IJ SOSY
40.0000 mg | PREFILLED_SYRINGE | Freq: Every day | INTRAMUSCULAR | Status: DC
  Administered 2020-09-17: 40 mg via SUBCUTANEOUS
  Filled 2020-09-17: qty 0.4

## 2020-09-17 MED ORDER — HYDROMORPHONE HCL 1 MG/ML IJ SOLN: 0.5000 mg | INTRAMUSCULAR | Status: DC | PRN

## 2020-09-17 MED ORDER — ENALAPRILAT 1.25 MG/ML IV SOLN
1.2500 mg | Freq: Four times a day (QID) | INTRAVENOUS | Status: DC
  Administered 2020-09-17: 1.25 mg via INTRAVENOUS
  Filled 2020-09-17 (×2): qty 1

## 2020-09-17 NOTE — Discharge Summary (Signed)
This patient has left AMA, I was informed that 9:15 AM by the charge nurse on the floor that patient left AMA due to a family emergency as soon as he arrived to the floor from the ER.  I had not seen him this morning yet as I was doing my rounds.  He has signed the [facility] papers given to him by the charge nurse.  Kindly review H&P document done few hours ago, below -       Shalhoub, Sherryll Burger, MD  Physician  Hospitalist  H&P     Signed  Date of Service:  09/17/2020 7:15 AM           Signed      Expand All Collapse All    Show:Clear all '[x]' Manual'[x]' Template'[]' Copied  Added by: '[x]' Shalhoub, Sherryll Burger, MD   '[]' Hover for details   History and Physical    Randy [name]. GLO:[id number] DOB: 02/17/1981 DOA: 09/17/2020  PCP: Nicolette Bang, DO  Patient coming from: Home   Chief Complaint:     Chief Complaint  Patient presents with  . Abdominal Pain  . Migraine     HPI:    40 year old male with past medical history of diabetes mellitus type 2, hyperlipidemia, gastroesophageal reflux disease, hypertension, marijuana abuse who presents to [facility] emergency department with complaints of abdominal pain nausea and vomiting.  Of note, patient was recently hospitalized at [facility] from 5/2 until 5/4.  During that hospitalization, patient was admitted for hypertensive crisis complicated by acute kidney injury.  Patient also complained of nausea vomiting and abdominal pain radiating to the back during that hospitalization however CT imaging of the abdomen at that time revealed no evidence of pancreatitis.  Patient was discharged home on 5/4 with oral antihypertensive therapy including amlodipine Coreg and lisinopril.  Since patient was discharged from [facility] on 5/4, patient has continued to experience severe abdominal pain.  Patient describes abdominal pain as "sharp and throbbing" in quality, severe in intensity and radiating  towards his back.  Pain waxes and wanes and seems to worsen with any attempted ingesting oral intake whether it be solid or liquid.  Patient also complains of frequent bouts of intense nausea with vomiting.  Episodes of nausea vomiting typically occur approximately 1 to 2 hours after any ingestion of food.  Patient denies any associated hematemesis.  Patient denies any diarrhea, blood in the stool, dysuria fever sick contacts or recent ingestion of undercooked food.  Due to patient's persisting abdominal pain patient presented to [facility] emergency department again on 5/5 with the symptoms followed by another presentation of 5/6.  Patient was sent home after each presentation.  After patient's 5/5 presentation patient was told that he had a urinary tract infection and was sent home on Keflex patient has not been able to tolerate these oral antibiotics nor has that he been able to tolerate his oral antihypertensive regimen.  Due to patient's ongoing abdominal pain nausea vomiting and inability to tolerate oral intake patient presented once again to [facility] emergency department 5/7.  This time, patient was noted to have a elevated lipase of 263 with CT imaging of the abdomen and pelvis suggesting subtle inflammation at the root of the mesentery suggestive of acute pancreatitis.  Patient was provided a 1 L bolus of lactated Ringer solution and 1 mg of intravenous Dilaudid for pain control.  Patient continued to vomit in the emergency department and due to intractable symptoms  hospitalist group was then called to assess the patient for admission to the hospital.   Review of Systems:   Review of Systems  Gastrointestinal: Positive for abdominal pain, nausea and vomiting.  Neurological: Positive for weakness.  All other systems reviewed and are negative.       Past Medical History:  Diagnosis Date  . Essential hypertension 05/31/2019  . HLD (hyperlipidemia)   .  Hypertension   . Pancreatitis   . Type II diabetes mellitus ([place])     Past Surgical History:  Procedure Laterality Date  . NO PAST SURGERIES       reports that he quit smoking about 8 years ago. His smoking use included cigarettes. He has a 5.61 pack-year smoking history. He has never used smokeless tobacco. He reports current drug use. Drug: Marijuana. He reports that he does not drink alcohol.      Allergies  Allergen Reactions  . Tomato          Family History  Problem Relation Age of Onset  . Hypertension Mother   . Diabetes Father   . Hypertension Father   . Diabetes Brother   . Diabetes Maternal Grandmother   . Colon cancer Neg Hx   . Esophageal cancer Neg Hx   . Rectal cancer Neg Hx   . Stomach cancer Neg Hx             Prior to Admission medications   Medication Sig Start Date End Date Taking? Authorizing Provider  amLODipine (NORVASC) 10 MG tablet Take 1 tablet (10 mg total) by mouth daily. 09/13/20 09/13/21  Geradine Girt, DO  atorvastatin (LIPITOR) 80 MG tablet Take 1 tablet (80 mg total) by mouth daily. 09/13/20 09/13/21  Geradine Girt, DO  Blood Glucose Monitoring Suppl (TRUE METRIX METER) w/Device KIT Use to check blood sugar TID. E11.65 Patient taking differently: 1 each by Other route in the morning, at noon, and at bedtime. Use to check blood sugar TID. E11.65 06/10/19   Charlott Rakes, MD  carvedilol (COREG) 25 MG tablet Take 1 tablet (25 mg total) by mouth 2 (two) times daily with a meal. 09/13/20 09/13/21  Geradine Girt, DO  cephALEXin (KEFLEX) 500 MG capsule Take 1 capsule (500 mg total) by mouth 4 (four) times daily. 09/14/20   Daleen Bo, MD  glipiZIDE (GLUCOTROL) 5 MG tablet Take 1 tablet (5 mg total) by mouth 2 (two) times daily before a meal. 09/13/20 09/13/21  Eulogio Bear U, DO  glucose blood test strip Use as instructed to check TID. E11.65 Patient taking differently: 1 each by Other route in the morning, at noon, and  at bedtime. Use as instructed to check TID. E11.65 06/10/19   Charlott Rakes, MD  HYDROcodone-acetaminophen (NORCO) 5-325 MG tablet Take 2 tablets by mouth every 4 (four) hours as needed. 09/16/20   Daleen Bo, MD  insulin glargine (LANTUS) 100 UNIT/ML Solostar Pen Inject 26 Units into the skin daily. 09/13/20 11/20/20  Geradine Girt, DO  Insulin Pen Needle (PENTIPS) 32G X 4 MM MISC Use as directed 09/13/20   Geradine Girt, DO  Insulin Pen Needle (TRUEPLUS 5-BEVEL PEN NEEDLES) 32G X 4 MM MISC Use to inject insulin daily. Patient taking differently: 1 each by Other route daily. Use to inject insulin daily. 06/10/19   Charlott Rakes, MD  lisinopril (ZESTRIL) 40 MG tablet Take 1 tablet (40 mg total) by mouth daily. 09/13/20 09/13/21  Geradine Girt, DO  omeprazole (PRILOSEC) 40 MG capsule  Take 1 capsule (40 mg total) by mouth daily. 09/13/20   Geradine Girt, DO  ondansetron (ZOFRAN ODT) 4 MG disintegrating tablet Take 1 tablet (4 mg total) by mouth every 8 (eight) hours as needed for nausea or vomiting. 03/22/20   Hayden Rasmussen, MD  sildenafil (VIAGRA) 100 MG tablet TAKE 0.5-1 TABLETS (50-100 MG TOTAL) BY MOUTH DAILY AS NEEDED FOR ERECTILE DYSFUNCTION. Patient taking differently: Take 50-100 mg by mouth as needed for erectile dysfunction. 11/30/19 11/29/20  Nicolette Bang, DO  TRUEplus Lancets 28G MISC Use to check blood sugar three times daily. E11.65 Patient taking differently: 1 each by Other route in the morning, at noon, and at bedtime. Use to check blood sugar three times daily. E11.65 06/10/19   Charlott Rakes, MD    Physical Exam:       Vitals:   09/17/20 0341 09/17/20 0500 09/17/20 0600 09/17/20 0700  BP: (!) 171/109 (!) 164/92 (!) 207/108 (!) 172/89  Pulse: 72 76 78 85  Resp: '17 16 18   ' Temp: 97.9 F (36.6 C)     SpO2: 100% 97% 100% 99%    Constitutional: Awake alert and oriented x3, patient is in distress due to ongoing abdominal pain. Skin: no  rashes, no lesions, poor skin turgor noted. Eyes: Pupils are equally reactive to light.  No evidence of scleral icterus or conjunctival pallor.  ENMT: Moist mucous membranes noted.  Posterior pharynx clear of any exudate or lesions.   Neck: normal, supple, no masses, no thyromegaly.  No evidence of jugular venous distension.   Respiratory: clear to auscultation bilaterally, no wheezing, no crackles. Normal respiratory effort. No accessory muscle use.  Cardiovascular: Regular rate and rhythm, no murmurs / rubs / gallops. No extremity edema. 2+ pedal pulses. No carotid bruits.  Chest:   Nontender without crepitus or deformity.   Back:   Nontender without crepitus or deformity. Abdomen: Notable abdominal tenderness in the periumbilical region.  Abdomen is soft however.  No evidence of intra-abdominal masses.  Positive bowel sounds noted in all quadrants.   Musculoskeletal: No joint deformity upper and lower extremities. Good ROM, no contractures. Normal muscle tone.  Neurologic: CN 2-12 grossly intact. Sensation intact.  Patient moving all 4 extremities spontaneously.  Patient is following all commands.  Patient is responsive to verbal stimuli.   Psychiatric: Patient exhibits anxious mood with flat affect.  Patient seems to possess insight as to their current situation.     Labs on Admission: I have personally reviewed following labs and imaging studies -   CBC: Last Labs           Recent Labs  Lab 09/12/20 0003 09/12/20 0518 09/13/20 0350 09/14/20 0157 09/16/20 0906 09/17/20 0348  WBC 8.3 8.9 6.6 7.9 7.2 6.4  NEUTROABS 5.1  --  3.1  --   --   --   HGB 10.3* 10.4* 10.4* 10.4* 10.9* 11.1*  HCT 31.8* 32.5* 31.5* 32.0* 34.6* 34.0*  MCV 83.5 84.0 82.5 83.1 84.8 83.1  PLT 277 310 282 296 313 314     Basic Metabolic Panel: Last Labs          Recent Labs  Lab 09/12/20 0518 09/13/20 0350 09/14/20 0157 09/16/20 0906 09/17/20 0348  NA 134* 135 136 136 134*  K 4.4 4.1 4.0 4.0 3.7   CL 101 105 102 104 100  CO2 '25 26 23 25 26  ' GLUCOSE 218* 134* 158* 133* 204*  BUN 14 14 23* 15 15  CREATININE  1.25* 1.37* 2.13* 1.42* 1.37*  CALCIUM 8.6* 8.5* 9.0 9.0 8.8*  MG 2.0  --   --   --   --      GFR: Estimated Creatinine Clearance: 77.6 mL/min (A) (by C-G formula based on SCr of 1.37 mg/dL (H)). Liver Function Tests: Last Labs          Recent Labs  Lab 09/12/20 0003 09/13/20 0350 09/14/20 0157 09/16/20 0906 09/17/20 0348  AST '24 19 25 26 23  ' ALT '17 14 17 16 17  ' ALKPHOS 92 74 71 85 86  BILITOT 0.8 0.8 0.5 0.6 0.6  PROT 6.8 6.2* 6.7 7.3 7.1  ALBUMIN 2.8* 2.7* 3.2* 3.1* 3.1*     Last Labs         Recent Labs  Lab 09/12/20 0003 09/14/20 0157 09/16/20 0906 09/17/20 0348  LIPASE 73* 94* 254* 263*     Last Labs   No results for input(s): AMMONIA in the last 168 hours.   Coagulation Profile: Last Labs   No results for input(s): INR, PROTIME in the last 168 hours.   Cardiac Enzymes: Last Labs   No results for input(s): CKTOTAL, CKMB, CKMBINDEX, TROPONINI in the last 168 hours.   BNP (last 3 results) Recent Labs (within last 365 days)  No results for input(s): PROBNP in the last 8760 hours.   HbA1C: Recent Labs (last 2 labs)   No results for input(s): HGBA1C in the last 72 hours.   CBG: Last Labs          Recent Labs  Lab 09/12/20 1251 09/12/20 1612 09/12/20 2057 09/13/20 0644 09/13/20 1126  GLUCAP 182* 232* 157* 140* 172*     Lipid Profile: Recent Labs (last 2 labs)   No results for input(s): CHOL, HDL, LDLCALC, TRIG, CHOLHDL, LDLDIRECT in the last 72 hours.   Thyroid Function Tests: Recent Labs (last 2 labs)   No results for input(s): TSH, T4TOTAL, FREET4, T3FREE, THYROIDAB in the last 72 hours.   Anemia Panel: Recent Labs (last 2 labs)   No results for input(s): VITAMINB12, FOLATE, FERRITIN, TIBC, IRON, RETICCTPCT in the last 72 hours.   Urine analysis: Labs (Brief)          Component Value Date/Time   COLORURINE YELLOW  09/17/2020 0645   APPEARANCEUR CLEAR 09/17/2020 0645   LABSPEC 1.031 (H) 09/17/2020 0645   PHURINE 6.0 09/17/2020 0645   GLUCOSEU 150 (A) 09/17/2020 0645   HGBUR SMALL (A) 09/17/2020 0645   BILIRUBINUR NEGATIVE 09/17/2020 0645   KETONESUR 20 (A) 09/17/2020 0645   PROTEINUR >=300 (A) 09/17/2020 0645   UROBILINOGEN 1.0 10/19/2013 2130   NITRITE NEGATIVE 09/17/2020 0645   LEUKOCYTESUR NEGATIVE 09/17/2020 0645      Radiological Exams on Admission - Personally Reviewed:  Imaging Results (Last 48 hours)  CT ABDOMEN PELVIS W CONTRAST  Result Date: 09/17/2020 CLINICAL DATA:  40 year old male with generalized abdominal and back pain. EXAM: CT ABDOMEN AND PELVIS WITH CONTRAST TECHNIQUE: Multidetector CT imaging of the abdomen and pelvis was performed using the standard protocol following bolus administration of intravenous contrast. CONTRAST:  60m OMNIPAQUE IOHEXOL 350 MG/ML SOLN COMPARISON:  CTA CT Chest, Abdomen, and Pelvis 09/12/2020, and earlier. FINDINGS: Lower chest: Negative; minor costophrenic angle atelectasis. No pericardial or pleural effusion. Hepatobiliary: Negative liver and gallbladder. Pancreas: The pancreatic tail and distal body appear within normal limits. There is no pancreatic ductal dilatation. However, at the head and uncinate process there is subtle regional inflammatory stranding (series 3, image 32  and coronal image 38). Pancreatic enhancement is maintained. The inflammation appears to secondarily affect the decompressed duodenum in the midline and the proximal root of the small bowel mesentery. No free fluid or fluid collection. Small peripancreatic lymph nodes remain within normal limits. Spleen: Negative. Adrenals/Urinary Tract: Normal adrenal glands. Symmetric renal enhancement and contrast excretion. No nephrolithiasis. Decompressed ureters. Diminutive and unremarkable bladder. Stomach/Bowel: Negative large bowel aside from mild retained stool. Elongated  appendix remains normal, with gas-filled appendix tip on coronal image 36. No large bowel inflammation. Decompressed and negative terminal ileum. No dilated small bowel. Fairly decompressed stomach. Decompressed duodenum. No free air. No free fluid. Vascular/Lymphatic: Major arterial structures are patent with minimal atherosclerosis. Portal venous system is patent. No lymphadenopathy. Reproductive: Negative. Other: No pelvic free fluid. Musculoskeletal: No acute osseous abnormality identified. IMPRESSION: 1. Subtle inflammation at the root of the mesentery suspicious for Mild Acute Pancreatitis. No free fluid or complicating features. 2. No other acute or inflammatory process identified in the abdomen or pelvis. Normal appendix. Electronically Signed   By: Genevie Ann M.D.   On: 09/17/2020 05:50     Telemetry: Personally reviewed.  Rhythm is normal sinus rhythm with heart rate of 85 bpm.   Assessment/Plan Principal Problem:   Acute pancreatitis with associated intractable nausea and vomiting   Patient presenting with nearly 2-week history of persisting abdominal pain radiating to his back with associated nausea and vomiting  During patient's recent hospitalization 5/2 until 5/4 patient had similar symptoms but work-up at that time was negative.  Patient is presenting with persisting intractable symptoms of nausea and vomiting with abdominal pain but this time CT imaging and laboratory values are supportive of pancreatitis  Of note, chart review reveals the patient has undergone significant work-ups in the past for similar episodes.  In 10/2019 patient underwent an EGD which revealed a small hiatal hernia and mild erosive gastropathy but otherwise unremarkable.  During her presentation in 03/2020 patient underwent an MRCP which revealed mild extrahepatic biliary duct dilatation but no evidence of choledocholithiasis or pancreatitis.  While most likely cause of patient's symptoms at this point is  pancreatitis, other possible contributing causes of patient's symptoms could be diabetic gastroparesis or possibly even cannabinoid hyperemesis syndrome  We will keep patient n.p.o. for now  Transitioning as many medications as possible to intravenous equivalents  As needed opiate-based analgesics for associated pain  As needed antiemetics for nausea vomiting  Obtaining lipid panel and toxicology screen.  CT imaging of the abdomen pelvis reveals no evidence of obvious biliary disease.  Patient denies alcohol use.  Patient would benefit from gastric emptying study in the outpatient setting  Active Problems:   Hypertensive urgency  Patient presenting with hypertensive urgency with blood pressures as high as 207/108 in the emergency department  This is primarily due to the patient being unable to tolerate oral antihypertensive therapy due to nausea and vomiting  Activated temporarily transition patient to an intravenous antihypertensive regimen of scheduled metoprolol and Vasotec  I have additionally ordered as needed intravenous hydralazine for persistently elevated blood pressures.   Transition to previously prescribed home regimen of oral antihypertensive regimen once able to tolerate oral intake    Type 2 diabetes mellitus with hyperglycemia, with long-term current use of insulin (HCC)   Accu-Cheks every 6 hours with high intensity sliding scale insulin  Holding Lantus for now due to tenuous oral intake  Hemoglobin A1c pending    Marijuana abuse   Counseling on cessation  Ongoing marijuana  use may be contributing to patient's presentations in the form of cannabinoid hyperemesis syndrome    GERD (gastroesophageal reflux disease)   Intravenous PPI for now    Abnormal urinalysis   Patient was told he had a urinary tract infection during an emergency department visit on 5/5  While urinalysis at that time was indeed abnormal, urine culture had mixed  flora  We will repeat urinalysis/urine culture although in the absence of dysuria or fever I doubt patient has a urinary tract infection and this was likely asymptomatic bacteriuria    Code Status:  Full code Family Communication: Wife is at bedside and has been updated on plan of care  Status is: Observation  The patient remains OBS appropriate and will d/c before 2 midnights.  Dispo: The patient is from: Home  Anticipated d/c is to: Home  Patient currently is not medically stable to d/c.              Difficult to place patient No        Vernelle Emerald MD [facility] Pager [phone]  If 7PM-7AM, please contact night-coverage [email or web address] Use universal [place] password for that web site. If you do not have the password, please call the hospital operator.  09/17/2020, 7:15 AM              Routing History              Note Details  Author Cyd Silence, Sherryll Burger, MD File Time 09/17/2020 7:38 AM  Author Type Physician Status Signed  Last Editor Vernelle Emerald, [place] # [id number] Admit Date 09/17/2020

## 2020-09-17 NOTE — ED Triage Notes (Signed)
Patient reports generalized abdominal pain and lower back pain, also with migraine, seen here for same yesterday.

## 2020-09-17 NOTE — H&P (Signed)
History and Physical    Randy [name]. TRR:[id number] DOB: 1980/07/21 DOA: 09/17/2020  PCP: Nicolette Bang, DO  Patient coming from: Home   Chief Complaint:  Chief Complaint  Patient presents with  . Abdominal Pain  . Migraine     HPI:    40 year old male with past medical history of diabetes mellitus type 2, hyperlipidemia, gastroesophageal reflux disease, hypertension, marijuana abuse who presents to [facility] emergency department with complaints of abdominal pain nausea and vomiting.  Of note, patient was recently hospitalized at [facility] from 5/2 until 5/4.  During that hospitalization, patient was admitted for hypertensive crisis complicated by acute kidney injury.  Patient also complained of nausea vomiting and abdominal pain radiating to the back during that hospitalization however CT imaging of the abdomen at that time revealed no evidence of pancreatitis.  Patient was discharged home on 5/4 with oral antihypertensive therapy including amlodipine Coreg and lisinopril.  Since patient was discharged from [facility] on 5/4, patient has continued to experience severe abdominal pain.  Patient describes abdominal pain as "sharp and throbbing" in quality, severe in intensity and radiating towards his back.  Pain waxes and wanes and seems to worsen with any attempted ingesting oral intake whether it be solid or liquid.  Patient also complains of frequent bouts of intense nausea with vomiting.  Episodes of nausea vomiting typically occur approximately 1 to 2 hours after any ingestion of food.  Patient denies any associated hematemesis.  Patient denies any diarrhea, blood in the stool, dysuria fever sick contacts or recent ingestion of undercooked food.  Due to patient's persisting abdominal pain patient presented to [facility] emergency department again on 5/5 with the symptoms followed by another presentation of 5/6.  Patient was sent home  after each presentation.  After patient's 5/5 presentation patient was told that he had a urinary tract infection and was sent home on Keflex patient has not been able to tolerate these oral antibiotics nor has that he been able to tolerate his oral antihypertensive regimen.  Due to patient's ongoing abdominal pain nausea vomiting and inability to tolerate oral intake patient presented once again to [facility] emergency department 5/7.  This time, patient was noted to have a elevated lipase of 263 with CT imaging of the abdomen and pelvis suggesting subtle inflammation at the root of the mesentery suggestive of acute pancreatitis.  Patient was provided a 1 L bolus of lactated Ringer solution and 1 mg of intravenous Dilaudid for pain control.  Patient continued to vomit in the emergency department and due to intractable symptoms hospitalist group was then called to assess the patient for admission to the hospital.   Review of Systems:   Review of Systems  Gastrointestinal: Positive for abdominal pain, nausea and vomiting.  Neurological: Positive for weakness.  All other systems reviewed and are negative.   Past Medical History:  Diagnosis Date  . Essential hypertension 05/31/2019  . HLD (hyperlipidemia)   . Hypertension   . Pancreatitis   . Type II diabetes mellitus ([place])     Past Surgical History:  Procedure Laterality Date  . NO PAST SURGERIES       reports that he quit smoking about 8 years ago. His smoking use included cigarettes. He has a 5.61 pack-year smoking history. He has never used smokeless tobacco. He reports current drug use. Drug: Marijuana. He reports that he does not drink alcohol.  Allergies  Allergen Reactions  .  Tomato     Family History  Problem Relation Age of Onset  . Hypertension Mother   . Diabetes Father   . Hypertension Father   . Diabetes Brother   . Diabetes Maternal Grandmother   . Colon cancer Neg Hx   . Esophageal cancer Neg Hx   .  Rectal cancer Neg Hx   . Stomach cancer Neg Hx      Prior to Admission medications   Medication Sig Start Date End Date Taking? Authorizing Provider  amLODipine (NORVASC) 10 MG tablet Take 1 tablet (10 mg total) by mouth daily. 09/13/20 09/13/21  Geradine Girt, DO  atorvastatin (LIPITOR) 80 MG tablet Take 1 tablet (80 mg total) by mouth daily. 09/13/20 09/13/21  Geradine Girt, DO  Blood Glucose Monitoring Suppl (TRUE METRIX METER) w/Device KIT Use to check blood sugar TID. E11.65 Patient taking differently: 1 each by Other route in the morning, at noon, and at bedtime. Use to check blood sugar TID. E11.65 06/10/19   Charlott Rakes, MD  carvedilol (COREG) 25 MG tablet Take 1 tablet (25 mg total) by mouth 2 (two) times daily with a meal. 09/13/20 09/13/21  Geradine Girt, DO  cephALEXin (KEFLEX) 500 MG capsule Take 1 capsule (500 mg total) by mouth 4 (four) times daily. 09/14/20   Daleen Bo, MD  glipiZIDE (GLUCOTROL) 5 MG tablet Take 1 tablet (5 mg total) by mouth 2 (two) times daily before a meal. 09/13/20 09/13/21  Eulogio Bear U, DO  glucose blood test strip Use as instructed to check TID. E11.65 Patient taking differently: 1 each by Other route in the morning, at noon, and at bedtime. Use as instructed to check TID. E11.65 06/10/19   Charlott Rakes, MD  HYDROcodone-acetaminophen (NORCO) 5-325 MG tablet Take 2 tablets by mouth every 4 (four) hours as needed. 09/16/20   Daleen Bo, MD  insulin glargine (LANTUS) 100 UNIT/ML Solostar Pen Inject 26 Units into the skin daily. 09/13/20 11/20/20  Geradine Girt, DO  Insulin Pen Needle (PENTIPS) 32G X 4 MM MISC Use as directed 09/13/20   Geradine Girt, DO  Insulin Pen Needle (TRUEPLUS 5-BEVEL PEN NEEDLES) 32G X 4 MM MISC Use to inject insulin daily. Patient taking differently: 1 each by Other route daily. Use to inject insulin daily. 06/10/19   Charlott Rakes, MD  lisinopril (ZESTRIL) 40 MG tablet Take 1 tablet (40 mg total) by mouth daily. 09/13/20 09/13/21   Geradine Girt, DO  omeprazole (PRILOSEC) 40 MG capsule Take 1 capsule (40 mg total) by mouth daily. 09/13/20   Geradine Girt, DO  ondansetron (ZOFRAN ODT) 4 MG disintegrating tablet Take 1 tablet (4 mg total) by mouth every 8 (eight) hours as needed for nausea or vomiting. 03/22/20   Hayden Rasmussen, MD  sildenafil (VIAGRA) 100 MG tablet TAKE 0.5-1 TABLETS (50-100 MG TOTAL) BY MOUTH DAILY AS NEEDED FOR ERECTILE DYSFUNCTION. Patient taking differently: Take 50-100 mg by mouth as needed for erectile dysfunction. 11/30/19 11/29/20  Nicolette Bang, DO  TRUEplus Lancets 28G MISC Use to check blood sugar three times daily. E11.65 Patient taking differently: 1 each by Other route in the morning, at noon, and at bedtime. Use to check blood sugar three times daily. E11.65 06/10/19   Charlott Rakes, MD    Physical Exam: Vitals:   09/17/20 0341 09/17/20 0500 09/17/20 0600 09/17/20 0700  BP: (!) 171/109 (!) 164/92 (!) 207/108 (!) 172/89  Pulse: 72 76 78 85  Resp: 17 16  18   Temp: 97.9 F (36.6 C)     SpO2: 100% 97% 100% 99%    Constitutional: Awake alert and oriented x3, patient is in distress due to ongoing abdominal pain. Skin: no rashes, no lesions, poor skin turgor noted. Eyes: Pupils are equally reactive to light.  No evidence of scleral icterus or conjunctival pallor.  ENMT: Moist mucous membranes noted.  Posterior pharynx clear of any exudate or lesions.   Neck: normal, supple, no masses, no thyromegaly.  No evidence of jugular venous distension.   Respiratory: clear to auscultation bilaterally, no wheezing, no crackles. Normal respiratory effort. No accessory muscle use.  Cardiovascular: Regular rate and rhythm, no murmurs / rubs / gallops. No extremity edema. 2+ pedal pulses. No carotid bruits.  Chest:   Nontender without crepitus or deformity.   Back:   Nontender without crepitus or deformity. Abdomen: Notable abdominal tenderness in the periumbilical region.  Abdomen is soft  however.  No evidence of intra-abdominal masses.  Positive bowel sounds noted in all quadrants.   Musculoskeletal: No joint deformity upper and lower extremities. Good ROM, no contractures. Normal muscle tone.  Neurologic: CN 2-12 grossly intact. Sensation intact.  Patient moving all 4 extremities spontaneously.  Patient is following all commands.  Patient is responsive to verbal stimuli.   Psychiatric: Patient exhibits anxious mood with flat affect.  Patient seems to possess insight as to their current situation.     Labs on Admission: I have personally reviewed following labs and imaging studies -   CBC: Recent Labs  Lab 09/12/20 0003 09/12/20 0518 09/13/20 0350 09/14/20 0157 09/16/20 0906 09/17/20 0348  WBC 8.3 8.9 6.6 7.9 7.2 6.4  NEUTROABS 5.1  --  3.1  --   --   --   HGB 10.3* 10.4* 10.4* 10.4* 10.9* 11.1*  HCT 31.8* 32.5* 31.5* 32.0* 34.6* 34.0*  MCV 83.5 84.0 82.5 83.1 84.8 83.1  PLT 277 310 282 296 313 073   Basic Metabolic Panel: Recent Labs  Lab 09/12/20 0518 09/13/20 0350 09/14/20 0157 09/16/20 0906 09/17/20 0348  NA 134* 135 136 136 134*  K 4.4 4.1 4.0 4.0 3.7  CL 101 105 102 104 100  CO2 '25 26 23 25 26  ' GLUCOSE 218* 134* 158* 133* 204*  BUN 14 14 23* 15 15  CREATININE 1.25* 1.37* 2.13* 1.42* 1.37*  CALCIUM 8.6* 8.5* 9.0 9.0 8.8*  MG 2.0  --   --   --   --    GFR: Estimated Creatinine Clearance: 77.6 mL/min (A) (by C-G formula based on SCr of 1.37 mg/dL (H)). Liver Function Tests: Recent Labs  Lab 09/12/20 0003 09/13/20 0350 09/14/20 0157 09/16/20 0906 09/17/20 0348  AST '24 19 25 26 23  ' ALT '17 14 17 16 17  ' ALKPHOS 92 74 71 85 86  BILITOT 0.8 0.8 0.5 0.6 0.6  PROT 6.8 6.2* 6.7 7.3 7.1  ALBUMIN 2.8* 2.7* 3.2* 3.1* 3.1*   Recent Labs  Lab 09/12/20 0003 09/14/20 0157 09/16/20 0906 09/17/20 0348  LIPASE 73* 94* 254* 263*   No results for input(s): AMMONIA in the last 168 hours. Coagulation Profile: No results for input(s): INR, PROTIME in  the last 168 hours. Cardiac Enzymes: No results for input(s): CKTOTAL, CKMB, CKMBINDEX, TROPONINI in the last 168 hours. BNP (last 3 results) No results for input(s): PROBNP in the last 8760 hours. HbA1C: No results for input(s): HGBA1C in the last 72 hours. CBG: Recent Labs  Lab 09/12/20 1251 09/12/20 1612 09/12/20 2057  09/13/20 0644 09/13/20 1126  GLUCAP 182* 232* 157* 140* 172*   Lipid Profile: No results for input(s): CHOL, HDL, LDLCALC, TRIG, CHOLHDL, LDLDIRECT in the last 72 hours. Thyroid Function Tests: No results for input(s): TSH, T4TOTAL, FREET4, T3FREE, THYROIDAB in the last 72 hours. Anemia Panel: No results for input(s): VITAMINB12, FOLATE, FERRITIN, TIBC, IRON, RETICCTPCT in the last 72 hours. Urine analysis:    Component Value Date/Time   COLORURINE YELLOW 09/17/2020 0645   APPEARANCEUR CLEAR 09/17/2020 0645   LABSPEC 1.031 (H) 09/17/2020 0645   PHURINE 6.0 09/17/2020 0645   GLUCOSEU 150 (A) 09/17/2020 0645   HGBUR SMALL (A) 09/17/2020 0645   BILIRUBINUR NEGATIVE 09/17/2020 0645   KETONESUR 20 (A) 09/17/2020 0645   PROTEINUR >=300 (A) 09/17/2020 0645   UROBILINOGEN 1.0 10/19/2013 2130   NITRITE NEGATIVE 09/17/2020 0645   LEUKOCYTESUR NEGATIVE 09/17/2020 0645    Radiological Exams on Admission - Personally Reviewed: CT ABDOMEN PELVIS W CONTRAST  Result Date: 09/17/2020 CLINICAL DATA:  40 year old male with generalized abdominal and back pain. EXAM: CT ABDOMEN AND PELVIS WITH CONTRAST TECHNIQUE: Multidetector CT imaging of the abdomen and pelvis was performed using the standard protocol following bolus administration of intravenous contrast. CONTRAST:  26m OMNIPAQUE IOHEXOL 350 MG/ML SOLN COMPARISON:  CTA CT Chest, Abdomen, and Pelvis 09/12/2020, and earlier. FINDINGS: Lower chest: Negative; minor costophrenic angle atelectasis. No pericardial or pleural effusion. Hepatobiliary: Negative liver and gallbladder. Pancreas: The pancreatic tail and distal body  appear within normal limits. There is no pancreatic ductal dilatation. However, at the head and uncinate process there is subtle regional inflammatory stranding (series 3, image 32 and coronal image 38). Pancreatic enhancement is maintained. The inflammation appears to secondarily affect the decompressed duodenum in the midline and the proximal root of the small bowel mesentery. No free fluid or fluid collection. Small peripancreatic lymph nodes remain within normal limits. Spleen: Negative. Adrenals/Urinary Tract: Normal adrenal glands. Symmetric renal enhancement and contrast excretion. No nephrolithiasis. Decompressed ureters. Diminutive and unremarkable bladder. Stomach/Bowel: Negative large bowel aside from mild retained stool. Elongated appendix remains normal, with gas-filled appendix tip on coronal image 36. No large bowel inflammation. Decompressed and negative terminal ileum. No dilated small bowel. Fairly decompressed stomach. Decompressed duodenum. No free air. No free fluid. Vascular/Lymphatic: Major arterial structures are patent with minimal atherosclerosis. Portal venous system is patent. No lymphadenopathy. Reproductive: Negative. Other: No pelvic free fluid. Musculoskeletal: No acute osseous abnormality identified. IMPRESSION: 1. Subtle inflammation at the root of the mesentery suspicious for Mild Acute Pancreatitis. No free fluid or complicating features. 2. No other acute or inflammatory process identified in the abdomen or pelvis. Normal appendix. Electronically Signed   By: HGenevie AnnM.D.   On: 09/17/2020 05:50    Telemetry: Personally reviewed.  Rhythm is normal sinus rhythm with heart rate of 85 bpm.   Assessment/Plan Principal Problem:   Acute pancreatitis with associated intractable nausea and vomiting   Patient presenting with nearly 2-week history of persisting abdominal pain radiating to his back with associated nausea and vomiting  During patient's recent hospitalization 5/2  until 5/4 patient had similar symptoms but work-up at that time was negative.  Patient is presenting with persisting intractable symptoms of nausea and vomiting with abdominal pain but this time CT imaging and laboratory values are supportive of pancreatitis  Of note, chart review reveals the patient has undergone significant work-ups in the past for similar episodes.  In 10/2019 patient underwent an EGD which revealed a small hiatal hernia and  mild erosive gastropathy but otherwise unremarkable.  During her presentation in 03/2020 patient underwent an MRCP which revealed mild extrahepatic biliary duct dilatation but no evidence of choledocholithiasis or pancreatitis.  While most likely cause of patient's symptoms at this point is pancreatitis, other possible contributing causes of patient's symptoms could be diabetic gastroparesis or possibly even cannabinoid hyperemesis syndrome  We will keep patient n.p.o. for now  Transitioning as many medications as possible to intravenous equivalents  As needed opiate-based analgesics for associated pain  As needed antiemetics for nausea vomiting  Obtaining lipid panel and toxicology screen.  CT imaging of the abdomen pelvis reveals no evidence of obvious biliary disease.  Patient denies alcohol use.  Patient would benefit from gastric emptying study in the outpatient setting  Active Problems:   Hypertensive urgency  Patient presenting with hypertensive urgency with blood pressures as high as 207/108 in the emergency department  This is primarily due to the patient being unable to tolerate oral antihypertensive therapy due to nausea and vomiting  Activated temporarily transition patient to an intravenous antihypertensive regimen of scheduled metoprolol and Vasotec  I have additionally ordered as needed intravenous hydralazine for persistently elevated blood pressures.   Transition to previously prescribed home regimen of oral antihypertensive  regimen once able to tolerate oral intake    Type 2 diabetes mellitus with hyperglycemia, with long-term current use of insulin (HCC)   Accu-Cheks every 6 hours with high intensity sliding scale insulin  Holding Lantus for now due to tenuous oral intake  Hemoglobin A1c pending    Marijuana abuse   Counseling on cessation  Ongoing marijuana use may be contributing to patient's presentations in the form of cannabinoid hyperemesis syndrome    GERD (gastroesophageal reflux disease)   Intravenous PPI for now    Abnormal urinalysis   Patient was told he had a urinary tract infection during an emergency department visit on 5/5  While urinalysis at that time was indeed abnormal, urine culture had mixed flora  We will repeat urinalysis/urine culture although in the absence of dysuria or fever I doubt patient has a urinary tract infection and this was likely asymptomatic bacteriuria    Code Status:  Full code Family Communication: Wife is at bedside and has been updated on plan of care  Status is: Observation  The patient remains OBS appropriate and will d/c before 2 midnights.  Dispo: The patient is from: Home              Anticipated d/c is to: Home              Patient currently is not medically stable to d/c.   Difficult to place patient No        Vernelle Emerald MD [facility] Pager [phone]  If 7PM-7AM, please contact night-coverage [email or web address] Use universal [place] password for that web site. If you do not have the password, please call the hospital operator.  09/17/2020, 7:15 AM

## 2020-09-17 NOTE — ED Provider Notes (Signed)
[facility] EMERGENCY DEPARTMENT Provider Note   CSN: [id number] Arrival date & time: 09/17/20  5638     History Chief Complaint  Patient presents with  . Abdominal Pain  . Migraine    Randy [name]. is a 40 y.o. male.   Abdominal Pain Pain location:  Epigastric Pain quality: aching and sharp   Pain radiates to:  Does not radiate Pain severity:  Mild Duration:  5 days Timing:  Constant Progression:  Worsening Chronicity:  Recurrent Context: not alcohol use        Past Medical History:  Diagnosis Date  . HLD (hyperlipidemia)   . Hypertension   . Pancreatitis   . Type II diabetes mellitus [facility])     Patient Active Problem List   Diagnosis Date Noted  . Acute pancreatitis 09/17/2020  . Hypertensive crisis 09/12/2020  . Nausea & vomiting 09/12/2020  . AKI (acute kidney injury) ([place]) 09/12/2020  . GERD (gastroesophageal reflux disease) 09/12/2020  . Type 2 diabetes mellitus without complication, with long-term current use of insulin ([place])   . Pancreatitis 04/08/2020  . Hyperlipidemia 06/01/2019  . Microalbuminuria due to type 2 diabetes mellitus ([place]) 06/01/2019  . Essential hypertension 05/31/2019  . Hypertensive urgency 08/31/2015  . Uncontrolled diabetes mellitus ([place]) 08/31/2015  . Marijuana abuse 08/31/2015    Past Surgical History:  Procedure Laterality Date  . NO PAST SURGERIES         Family History  Problem Relation Age of Onset  . Hypertension Mother   . Diabetes Father   . Hypertension Father   . Diabetes Brother   . Diabetes Maternal Grandmother   . Colon cancer Neg Hx   . Esophageal cancer Neg Hx   . Rectal cancer Neg Hx   . Stomach cancer Neg Hx     Social History   Tobacco Use  . Smoking status: Former Smoker    Packs/day: 0.33    Years: 17.00    Pack years: 5.61    Types: Cigarettes    Quit date: 12/26/2011    Years since quitting: 8.7  . Smokeless tobacco: Never Used  Vaping Use  . Vaping Use:  Never used  Substance Use Topics  . Alcohol use: No    Comment: none in6-7 years  . Drug use: Yes    Types: Marijuana    Comment: Nov 2020    Home Medications Prior to Admission medications   Medication Sig Start Date End Date Taking? Authorizing Provider  amLODipine (NORVASC) 10 MG tablet Take 1 tablet (10 mg total) by mouth daily. 09/13/20 09/13/21  Geradine Girt, DO  atorvastatin (LIPITOR) 80 MG tablet Take 1 tablet (80 mg total) by mouth daily. 09/13/20 09/13/21  Geradine Girt, DO  Blood Glucose Monitoring Suppl (TRUE METRIX METER) w/Device KIT Use to check blood sugar TID. E11.65 Patient taking differently: 1 each by Other route in the morning, at noon, and at bedtime. Use to check blood sugar TID. E11.65 06/10/19   Charlott Rakes, MD  carvedilol (COREG) 25 MG tablet Take 1 tablet (25 mg total) by mouth 2 (two) times daily with a meal. 09/13/20 09/13/21  Geradine Girt, DO  cephALEXin (KEFLEX) 500 MG capsule Take 1 capsule (500 mg total) by mouth 4 (four) times daily. 09/14/20   Daleen Bo, MD  glipiZIDE (GLUCOTROL) 5 MG tablet Take 1 tablet (5 mg total) by mouth 2 (two) times daily before a meal. 09/13/20 09/13/21  Eulogio Bear U, DO  glucose  blood test strip Use as instructed to check TID. E11.65 Patient taking differently: 1 each by Other route in the morning, at noon, and at bedtime. Use as instructed to check TID. E11.65 06/10/19   Charlott Rakes, MD  HYDROcodone-acetaminophen (NORCO) 5-325 MG tablet Take 2 tablets by mouth every 4 (four) hours as needed. 09/16/20   Daleen Bo, MD  insulin glargine (LANTUS) 100 UNIT/ML Solostar Pen Inject 26 Units into the skin daily. 09/13/20 11/20/20  Geradine Girt, DO  Insulin Pen Needle (PENTIPS) 32G X 4 MM MISC Use as directed 09/13/20   Geradine Girt, DO  Insulin Pen Needle (TRUEPLUS 5-BEVEL PEN NEEDLES) 32G X 4 MM MISC Use to inject insulin daily. Patient taking differently: 1 each by Other route daily. Use to inject insulin daily. 06/10/19    Charlott Rakes, MD  lisinopril (ZESTRIL) 40 MG tablet Take 1 tablet (40 mg total) by mouth daily. 09/13/20 09/13/21  Geradine Girt, DO  omeprazole (PRILOSEC) 40 MG capsule Take 1 capsule (40 mg total) by mouth daily. 09/13/20   Geradine Girt, DO  ondansetron (ZOFRAN ODT) 4 MG disintegrating tablet Take 1 tablet (4 mg total) by mouth every 8 (eight) hours as needed for nausea or vomiting. 03/22/20   Hayden Rasmussen, MD  sildenafil (VIAGRA) 100 MG tablet TAKE 0.5-1 TABLETS (50-100 MG TOTAL) BY MOUTH DAILY AS NEEDED FOR ERECTILE DYSFUNCTION. Patient taking differently: Take 50-100 mg by mouth as needed for erectile dysfunction. 11/30/19 11/29/20  Nicolette Bang, DO  TRUEplus Lancets 28G MISC Use to check blood sugar three times daily. E11.65 Patient taking differently: 1 each by Other route in the morning, at noon, and at bedtime. Use to check blood sugar three times daily. E11.65 06/10/19   Charlott Rakes, MD    Allergies    Tomato  Review of Systems   Review of Systems  Gastrointestinal: Positive for abdominal pain.  All other systems reviewed and are negative.   Physical Exam Updated Vital Signs BP (!) 207/108   Pulse 78   Temp 97.9 F (36.6 C)   Resp 18   SpO2 100%   Physical Exam Vitals and nursing note reviewed.  Constitutional:      Appearance: He is well-developed.  HENT:     Head: Normocephalic and atraumatic.     Mouth/Throat:     Mouth: Mucous membranes are moist.     Pharynx: Oropharynx is clear.  Eyes:     Pupils: Pupils are equal, round, and reactive to light.  Cardiovascular:     Rate and Rhythm: Normal rate.  Pulmonary:     Effort: Pulmonary effort is normal. No respiratory distress.  Abdominal:     General: Abdomen is flat. There is no distension.  Musculoskeletal:        General: Normal range of motion.     Cervical back: Normal range of motion.  Skin:    General: Skin is warm and dry.  Neurological:     General: No focal deficit present.      Mental Status: He is alert.     ED Results / Procedures / Treatments   Labs (all labs ordered are listed, but only abnormal results are displayed) Labs Reviewed  LIPASE, BLOOD - Abnormal; Notable for the following components:      Result Value   Lipase 263 (*)    All other components within normal limits  COMPREHENSIVE METABOLIC PANEL - Abnormal; Notable for the following components:   Sodium 134 (*)  Glucose, Bld 204 (*)    Creatinine, Ser 1.37 (*)    Calcium 8.8 (*)    Albumin 3.1 (*)    All other components within normal limits  CBC - Abnormal; Notable for the following components:   RBC 4.09 (*)    Hemoglobin 11.1 (*)    HCT 34.0 (*)    All other components within normal limits  SARS CORONAVIRUS 2 (TAT 6-24 HRS)  URINALYSIS, ROUTINE W REFLEX MICROSCOPIC    EKG None  Radiology CT ABDOMEN PELVIS W CONTRAST  Result Date: 09/17/2020 CLINICAL DATA:  41 year old male with generalized abdominal and back pain. EXAM: CT ABDOMEN AND PELVIS WITH CONTRAST TECHNIQUE: Multidetector CT imaging of the abdomen and pelvis was performed using the standard protocol following bolus administration of intravenous contrast. CONTRAST:  69m OMNIPAQUE IOHEXOL 350 MG/ML SOLN COMPARISON:  CTA CT Chest, Abdomen, and Pelvis 09/12/2020, and earlier. FINDINGS: Lower chest: Negative; minor costophrenic angle atelectasis. No pericardial or pleural effusion. Hepatobiliary: Negative liver and gallbladder. Pancreas: The pancreatic tail and distal body appear within normal limits. There is no pancreatic ductal dilatation. However, at the head and uncinate process there is subtle regional inflammatory stranding (series 3, image 32 and coronal image 38). Pancreatic enhancement is maintained. The inflammation appears to secondarily affect the decompressed duodenum in the midline and the proximal root of the small bowel mesentery. No free fluid or fluid collection. Small peripancreatic lymph nodes remain within  normal limits. Spleen: Negative. Adrenals/Urinary Tract: Normal adrenal glands. Symmetric renal enhancement and contrast excretion. No nephrolithiasis. Decompressed ureters. Diminutive and unremarkable bladder. Stomach/Bowel: Negative large bowel aside from mild retained stool. Elongated appendix remains normal, with gas-filled appendix tip on coronal image 36. No large bowel inflammation. Decompressed and negative terminal ileum. No dilated small bowel. Fairly decompressed stomach. Decompressed duodenum. No free air. No free fluid. Vascular/Lymphatic: Major arterial structures are patent with minimal atherosclerosis. Portal venous system is patent. No lymphadenopathy. Reproductive: Negative. Other: No pelvic free fluid. Musculoskeletal: No acute osseous abnormality identified. IMPRESSION: 1. Subtle inflammation at the root of the mesentery suspicious for Mild Acute Pancreatitis. No free fluid or complicating features. 2. No other acute or inflammatory process identified in the abdomen or pelvis. Normal appendix. Electronically Signed   By: HGenevie AnnM.D.   On: 09/17/2020 05:50    Procedures Procedures   Medications Ordered in ED Medications  droperidol (INAPSINE) 2.5 MG/ML injection 2.5 mg (2.5 mg Intravenous Given 09/17/20 0457)  HYDROmorphone (DILAUDID) injection 1 mg (1 mg Intravenous Given 09/17/20 0448)  lactated ringers bolus 1,000 mL (0 mLs Intravenous Stopped 09/17/20 0647)  iohexol (OMNIPAQUE) 350 MG/ML injection 75 mL (75 mLs Intravenous Contrast Given 09/17/20 0542)    ED Course  I have reviewed the triage vital signs and the nursing notes.  Pertinent labs & imaging results that were available during my care of the patient were reviewed by me and considered in my medical decision making (see chart for details).    MDM Rules/Calculators/A&P                          Recurrent pancreatitis. 4th visit in as many days. Will check labs, give meds, check ct.   Results as above. Lipase elevated  c/w mild pancreatitis. Ct same. Pain somewhat improved but still moaning. Secondary to obviously not getting better as an outpatient and findings of worsening lipases will plan for admission for clear liquids and better pain control.   Final Clinical  Impression(s) / ED Diagnoses Final diagnoses:  Abdominal pain, unspecified abdominal location  Acute pancreatitis, unspecified complication status, unspecified pancreatitis type    Rx / DC Orders ED Discharge Orders    None       Zeniyah Peaster, Corene Cornea, MD 09/17/20 [phone]

## 2020-09-17 NOTE — Progress Notes (Signed)
Pt called up to nurses station stating he needed to leave. When this RN went to speak with pt he stated he had a family emergency with son and need to leave immediately.  RN explained to pt that he would be leaving AMA, and what that meant, pt verbalized understanding and sighed AMA paper work, IV removed and pt left floor Dr. Thedore Mins notified.

## 2020-09-18 ENCOUNTER — Telehealth: Payer: Self-pay

## 2020-09-18 NOTE — Telephone Encounter (Signed)
Transition Care Management Unsuccessful Follow-up Telephone Call  Date of discharge and from where:  09/17/2020, [facility]  - left AMA  Attempts:  1st Attempt  Reason for unsuccessful TCM follow-up call:  Left voice message - with patient's wife requesting patient return the call to this CM.  He has an appointment with Dr Earlene Plater 09/28/2020.

## 2020-09-19 ENCOUNTER — Telehealth: Payer: Self-pay

## 2020-09-19 NOTE — Telephone Encounter (Addendum)
Transition Care Management Follow-up Telephone Call  Patient returned CM call.   Date of discharge and from where: 09/17/2020, [facility].  Left AMA  How have you been since you were released from the hospital? He said he is feeling okay. Continues to tolerate a liquid diet and has been taking only liquids for about a week.  He said that he is planning to advance his diet slowly starting 09/21/2020 and will call Dr Earlene Plater if he has any problems tolerating his regular diet. He also noted that he continues to have watery stools   Any questions or concerns? Yes - he would like to know what has caused his pancreatitis and what he can do to treat it.  He said he will discuss further with Dr Earlene Plater  Items Reviewed:  Did the pt receive and understand the discharge instructions provided? no discharge instructions, he left AMA  Medications obtained and verified? no new meds ordered, he left AMA. He said that he has all of the medications that he was on prior to the hospitaliztion and he is taking them as ordered   Other? No   Any new allergies since your discharge? No   Dietary orders reviewed? Yes  Do you have support at home? Yes , his wife.   Home Care and Equipment/Supplies: Were home health services ordered? no If so, what is the name of the agency? n/a  Has the agency set up a time to come to the patient's home? not applicable Were any new equipment or medical supplies ordered?  No What is the name of the medical supply agency? n/a Were you able to get the supplies/equipment? not applicable Do you have any questions related to the use of the equipment or supplies? No   He has a home BP monitor and glucometer but has not checked blood sugar or BP today.   Functional Questionnaire: (I = Independent and D = Dependent) ADLs: independent  Follow up appointments reviewed:   PCP Hospital f/u appt confirmed? Yes  - Dr Earlene Plater - 09/28/2020. Instructed him to call the clinic if he  would like to be seen sooner.   Specialist Hospital f/u appt confirmed? none scheduled at this time   Are transportation arrangements needed? No   If their condition worsens, is the pt aware to call PCP or go to the Emergency Dept.? Yes  Was the patient provided with contact information for the PCP's office or ED? Yes  Was to pt encouraged to call back with questions or concerns? Yes

## 2020-09-27 ENCOUNTER — Inpatient Hospital Stay: Payer: Self-pay | Admitting: Internal Medicine

## 2020-09-28 ENCOUNTER — Inpatient Hospital Stay: Payer: Self-pay | Admitting: Internal Medicine

## 2020-10-25 ENCOUNTER — Other Ambulatory Visit: Payer: Self-pay

## 2020-10-25 ENCOUNTER — Other Ambulatory Visit: Payer: Self-pay | Admitting: Internal Medicine

## 2020-10-25 DIAGNOSIS — E119 Type 2 diabetes mellitus without complications: Secondary | ICD-10-CM

## 2020-10-25 DIAGNOSIS — E785 Hyperlipidemia, unspecified: Secondary | ICD-10-CM

## 2020-10-25 DIAGNOSIS — I10 Essential (primary) hypertension: Secondary | ICD-10-CM

## 2020-10-25 DIAGNOSIS — E1165 Type 2 diabetes mellitus with hyperglycemia: Secondary | ICD-10-CM

## 2020-10-25 MED FILL — Sildenafil Citrate Tab 100 MG: ORAL | 30 days supply | Qty: 10 | Fill #1 | Status: CN

## 2020-10-27 NOTE — Telephone Encounter (Signed)
Called pt to schedule an appointment 6.16.2022 at 5:31p and 6.17.2022 at 9:36a. Unable to reach patient, but did lvm to call the office so we can get an appointment scheduled for his medication refills.

## 2020-10-30 ENCOUNTER — Telehealth (INDEPENDENT_AMBULATORY_CARE_PROVIDER_SITE_OTHER): Payer: Self-pay | Admitting: Family

## 2020-10-30 ENCOUNTER — Other Ambulatory Visit: Payer: Self-pay

## 2020-10-30 DIAGNOSIS — E119 Type 2 diabetes mellitus without complications: Secondary | ICD-10-CM

## 2020-10-30 DIAGNOSIS — I10 Essential (primary) hypertension: Secondary | ICD-10-CM

## 2020-10-30 MED ORDER — TRUE METRIX METER W/DEVICE KIT
PACK | 0 refills | Status: DC
  Filled 2020-10-30: qty 1, 1d supply, fill #0
  Filled 2020-11-09: qty 1, 365d supply, fill #0

## 2020-10-30 MED ORDER — INSULIN PEN NEEDLE 31G X 8 MM MISC
6 refills | Status: DC
  Filled 2020-10-30: qty 100, 25d supply, fill #0
  Filled 2021-04-09: qty 100, 100d supply, fill #0
  Filled 2021-07-31: qty 100, 90d supply, fill #0

## 2020-10-30 MED ORDER — TRUE METRIX BLOOD GLUCOSE TEST VI STRP
ORAL_STRIP | 12 refills | Status: DC
  Filled 2020-10-30 – 2020-11-09 (×2): qty 100, 25d supply, fill #0
  Filled 2021-04-09: qty 100, 25d supply, fill #1

## 2020-10-30 MED ORDER — TRUEPLUS LANCETS 28G MISC
4 refills | Status: DC
  Filled 2020-10-30 – 2020-11-09 (×2): qty 100, 25d supply, fill #0
  Filled 2021-04-09: qty 100, 25d supply, fill #1

## 2020-10-30 MED ORDER — AMLODIPINE BESYLATE 10 MG PO TABS
10.0000 mg | ORAL_TABLET | Freq: Every day | ORAL | 0 refills | Status: DC
  Filled 2020-10-30 – 2020-11-09 (×2): qty 30, 30d supply, fill #0

## 2020-10-30 MED ORDER — LISINOPRIL 40 MG PO TABS
40.0000 mg | ORAL_TABLET | Freq: Every day | ORAL | 0 refills | Status: DC
  Filled 2020-10-30 – 2020-11-09 (×2): qty 30, 30d supply, fill #0

## 2020-10-30 MED ORDER — GLIPIZIDE 10 MG PO TABS
10.0000 mg | ORAL_TABLET | Freq: Two times a day (BID) | ORAL | 0 refills | Status: DC
  Filled 2020-10-30 – 2020-11-09 (×2): qty 60, 30d supply, fill #0

## 2020-10-30 MED ORDER — INSULIN GLARGINE 100 UNIT/ML SOLOSTAR PEN
30.0000 [IU] | PEN_INJECTOR | Freq: Every day | SUBCUTANEOUS | 0 refills | Status: DC
  Filled 2020-10-30 – 2020-11-09 (×2): qty 9, 30d supply, fill #0

## 2020-10-30 MED ORDER — CARVEDILOL 25 MG PO TABS
25.0000 mg | ORAL_TABLET | Freq: Two times a day (BID) | ORAL | 0 refills | Status: DC
  Filled 2020-10-30 – 2020-11-09 (×2): qty 60, 30d supply, fill #0

## 2020-10-30 NOTE — Progress Notes (Signed)
Virtual Visit via Telephone Note  I connected with Randy [name]., on 10/30/2020 at 2:56 PM by telephone due to the COVID-19 pandemic and verified that I am speaking with the correct person using two identifiers.  Due to current restrictions/limitations of in-office visits due to the COVID-19 pandemic, this scheduled clinical appointment was converted to a telehealth visit.   Consent: I discussed the limitations, risks, security and privacy concerns of performing an evaluation and management service by telephone and the availability of in person appointments. I also discussed with the patient that there may be a patient responsible charge related to this service. The patient expressed understanding and agreed to proceed.   Location of Patient: Home  Location of Provider: [facility] [place] participating in Telemedicine visit: Jkai Arwood. Durene Fruits, NP Elmon Else, CMA   History of Present Illness: Randy [name] is a 40 year-old male who presents for   HYPERTENSION FOLLOW-UP: 11/30/2019 per DO note: BPs seem fairly well controlled. No red flag symptoms. Continue current regimen.   10/30/2020: Currently taking: see medication list Med Adherence: [x] Yes    [] No Medication side effects: [] Yes    [x] No Adherence with salt restriction (low-salt diet): [x] Yes    [] No Exercise: Yes [x] No [] Home Monitoring?: [] Yes    [x] No Monitoring Frequency: [] Yes    [x] No Home BP results range: [] Yes    [x] No Smoking [] Yes [x] No SOB? [] Yes    [x] No Chest Pain?: [] Yes    [x] No  2. DIABETES TYPE 2 FOLLOW-UP: 11/30/2019 per DO note: Prior A1c showed poor glycemic control. Lantus was increased from 22 to 24 units at home based on AM fasting CBGs and patient reports fasting CBGs now <130. Has continued Glipizide as well. Will plan to repeat A1c and adjust medication regimen as appropriate.   10/30/2020: Last A1C: 10.3% on  09/17/2020 Med Adherence:  [x] Yes    [] No Medication side effects:  [] Yes    [x] No Home Monitoring?  [] Yes    [x] No Diet Adherence: having challenges with eating more bread and rice  Exercise: [x] Yes    [] No   Past Medical History:  Diagnosis Date   Essential hypertension 05/31/2019   HLD (hyperlipidemia)    Hypertension    Pancreatitis    Type II diabetes mellitus (HCC)    Allergies  Allergen Reactions   Tomato     Current Outpatient Medications on File Prior to Visit  Medication Sig Dispense Refill   amLODipine (NORVASC) 10 MG tablet Take 1 tablet (10 mg total) by mouth daily. 30 tablet 0   atorvastatin (LIPITOR) 80 MG tablet Take 1 tablet (80 mg total) by mouth daily. 30 tablet 0   Blood Glucose Monitoring Suppl (TRUE METRIX METER) w/Device KIT Use to check blood sugar TID. E11.65 (Patient taking differently: 1 each by Other route in the morning, at noon, and at bedtime. Use to check blood sugar TID. E11.65) 1 kit 0   carvedilol (COREG) 25 MG tablet Take 1 tablet (25 mg total) by mouth 2 (two) times daily with a meal. 60 tablet 0   cephALEXin (KEFLEX) 500 MG capsule Take 1 capsule (500 mg total) by mouth 4 (four) times daily. 28 capsule 0   glipiZIDE (GLUCOTROL) 5 MG tablet Take 1 tablet (5 mg total) by mouth  2 (two) times daily before a meal. 60 tablet 0   glucose blood test strip Use as instructed to check TID. E11.65 (Patient taking differently: 1 each by Other route in the morning, at noon, and at bedtime. Use as instructed to check TID. E11.65) 100 each 11   HYDROcodone-acetaminophen (NORCO) 5-325 MG tablet Take 2 tablets by mouth every 4 (four) hours as needed. 15 tablet 0   insulin glargine (LANTUS) 100 UNIT/ML Solostar Pen Inject 26 Units into the skin daily. 9 mL 0   Insulin Pen Needle (PENTIPS) 32G X 4 MM MISC Use as directed 100 each 3   Insulin Pen Needle (TRUEPLUS 5-BEVEL PEN NEEDLES) 32G X 4 MM MISC Use to inject insulin daily. (Patient taking differently: 1  each by Other route daily. Use to inject insulin daily.) 100 each 11   lisinopril (ZESTRIL) 40 MG tablet Take 1 tablet (40 mg total) by mouth daily. 30 tablet 0   omeprazole (PRILOSEC) 40 MG capsule Take 1 capsule (40 mg total) by mouth daily. 30 capsule 3   ondansetron (ZOFRAN ODT) 4 MG disintegrating tablet Take 1 tablet (4 mg total) by mouth every 8 (eight) hours as needed for nausea or vomiting. 20 tablet 0   sildenafil (VIAGRA) 100 MG tablet TAKE 0.5-1 TABLETS (50-100 MG TOTAL) BY MOUTH DAILY AS NEEDED FOR ERECTILE DYSFUNCTION. (Patient taking differently: Take 50-100 mg by mouth as needed for erectile dysfunction.) 10 tablet 11   TRUEplus Lancets 28G MISC Use to check blood sugar three times daily. E11.65 (Patient taking differently: 1 each by Other route in the morning, at noon, and at bedtime. Use to check blood sugar three times daily. E11.65) 100 each 11   No current facility-administered medications on file prior to visit.    Observations/Objective: Alert and oriented x 3. Not in acute distress. Physical examination not completed as this is a telemedicine visit.  Assessment and Plan: 1. Essential hypertension: - Level of blood pressure control unknown as patient does not monitor in the home setting.  - Continue Amlodipine, Carvedilol, and Lisinopril as prescribed.  - Counseled on blood pressure goal of less than 130/80, low-sodium, DASH diet, medication compliance, 150 minutes of moderate intensity exercise per week as tolerated. Discussed medication compliance, adverse effects. - Follow-up with primary provider in 2 to 4 weeks or sooner if needed.  - amLODipine (NORVASC) 10 MG tablet; Take 1 tablet (10 mg total) by mouth daily.  Dispense: 30 tablet; Refill: 0 - carvedilol (COREG) 25 MG tablet; Take 1 tablet (25 mg total) by mouth 2 (two) times daily with a meal.  Dispense: 60 tablet; Refill: 0 - lisinopril (ZESTRIL) 40 MG tablet; Take 1 tablet (40 mg total) by mouth daily.  Dispense:  30 tablet; Refill: 0  2. Type 2 diabetes mellitus without complication, without long-term current use of insulin (HCC) - Hemoglobin last obtained 09/17/2020 at was 10.3% at that time. This is improved from previous hemoglobin A1c of 11.4% on 04/08/2020. Next hemoglobin A1c due August 2022.  - Level of blood sugar control unknown as patient does not monitor in the home setting. - To achieve an A1C goal of less than or equal to 7.0 percent, a fasting blood sugar of 80 to 130 mg/dL and a postprandial glucose (90 to 120 minutes after a meal) less than 180 mg/dL. In the event of sugars less than 60 mg/dl or greater than 400 mg/dl please notify the clinic ASAP. It is recommended that you undergo annual eye exams and  annual foot exams. - Discussed the importance of healthy eating habits, low-carbohydrate diet, low-sugar diet, regular aerobic exercise (at least 150 minutes a week as tolerated) and medication compliance to achieve or maintain control of diabetes. - Increase Glipizide from 5 mg twice daily to 10 mg twice daily.  - Increase Insulin Glargine from 26 units daily to 30 units daily.  - Follow-up with primary provider in 4 weeks or sooner if needed.  - glipiZIDE (GLUCOTROL) 10 MG tablet; Take 1 tablet (10 mg total) by mouth 2 (two) times daily before a meal.  Dispense: 60 tablet; Refill: 0 - insulin glargine (LANTUS) 100 UNIT/ML Solostar Pen; Inject 30 Units into the skin daily.  Dispense: 9 mL; Refill: 0 - Insulin Pen Needle (PEN NEEDLES) 31G X 8 MM MISC; UAD  Dispense: 100 each; Refill: 6 - Blood Glucose Monitoring Suppl (TRUE METRIX METER) w/Device KIT; Use as directed  Dispense: 1 kit; Refill: 0 - glucose blood (TRUE METRIX BLOOD GLUCOSE TEST) test strip; Use as instructed  Dispense: 100 each; Refill: 12 - TRUEplus Lancets 28G MISC; Use as directed  Dispense: 100 each; Refill: 4   Follow Up Instructions: Follow-up with primary provider in 2 to 4 weeks or sooner if needed for hypertension and  diabetes management.    Patient was given clear instructions to go to Emergency Department or return to medical center if symptoms don't improve, worsen, or new problems develop.The patient verbalized understanding.  I discussed the assessment and treatment plan with the patient. The patient was provided an opportunity to ask questions and all were answered. The patient agreed with the plan and demonstrated an understanding of the instructions.   The patient was advised to call back or seek an in-person evaluation if the symptoms worsen or if the condition fails to improve as anticipated.    I provided 15 minutes total of non-face-to-face time during this encounter.   Camillia Herter, NP  [facility] [place], [place] 10/30/2020, 2:56 PM

## 2020-10-31 ENCOUNTER — Other Ambulatory Visit: Payer: Self-pay

## 2020-10-31 ENCOUNTER — Other Ambulatory Visit: Payer: Self-pay | Admitting: Family

## 2020-10-31 DIAGNOSIS — E785 Hyperlipidemia, unspecified: Secondary | ICD-10-CM

## 2020-11-01 ENCOUNTER — Other Ambulatory Visit: Payer: Self-pay

## 2020-11-02 ENCOUNTER — Other Ambulatory Visit: Payer: Self-pay

## 2020-11-02 MED ORDER — ATORVASTATIN CALCIUM 80 MG PO TABS
80.0000 mg | ORAL_TABLET | Freq: Every day | ORAL | 0 refills | Status: DC
  Filled 2020-11-02: qty 30, 30d supply, fill #0
  Filled 2021-01-10: qty 30, 30d supply, fill #1

## 2020-11-03 ENCOUNTER — Other Ambulatory Visit: Payer: Self-pay

## 2020-11-06 ENCOUNTER — Other Ambulatory Visit: Payer: Self-pay

## 2020-11-09 ENCOUNTER — Other Ambulatory Visit: Payer: Self-pay

## 2020-11-09 MED FILL — Sildenafil Citrate Tab 100 MG: ORAL | 30 days supply | Qty: 10 | Fill #1 | Status: AC

## 2020-11-22 NOTE — Progress Notes (Signed)
Patient did not show for appointment.   

## 2020-11-24 ENCOUNTER — Encounter: Payer: Self-pay | Admitting: Family

## 2020-11-30 ENCOUNTER — Ambulatory Visit: Payer: Self-pay | Admitting: Family

## 2021-01-10 ENCOUNTER — Other Ambulatory Visit: Payer: Self-pay | Admitting: Family

## 2021-01-10 ENCOUNTER — Other Ambulatory Visit: Payer: Self-pay

## 2021-01-10 DIAGNOSIS — N529 Male erectile dysfunction, unspecified: Secondary | ICD-10-CM

## 2021-01-10 DIAGNOSIS — I10 Essential (primary) hypertension: Secondary | ICD-10-CM

## 2021-01-10 DIAGNOSIS — E119 Type 2 diabetes mellitus without complications: Secondary | ICD-10-CM

## 2021-01-12 ENCOUNTER — Other Ambulatory Visit: Payer: Self-pay

## 2021-01-12 MED ORDER — CARVEDILOL 25 MG PO TABS
25.0000 mg | ORAL_TABLET | Freq: Two times a day (BID) | ORAL | 0 refills | Status: DC
Start: 2021-01-12 — End: 2021-04-09
  Filled 2021-01-12: qty 60, 30d supply, fill #0

## 2021-01-12 MED ORDER — AMLODIPINE BESYLATE 10 MG PO TABS
10.0000 mg | ORAL_TABLET | Freq: Every day | ORAL | 0 refills | Status: DC
  Filled 2021-01-12: qty 30, 30d supply, fill #0

## 2021-01-12 MED ORDER — LISINOPRIL 40 MG PO TABS
40.0000 mg | ORAL_TABLET | Freq: Every day | ORAL | 0 refills | Status: DC
Start: 2021-01-12 — End: 2021-04-09
  Filled 2021-01-12: qty 30, 30d supply, fill #0

## 2021-01-12 MED ORDER — SILDENAFIL CITRATE 100 MG PO TABS
ORAL_TABLET | ORAL | 11 refills | Status: DC
  Filled 2021-01-12: qty 10, 10d supply, fill #0
  Filled 2021-04-09 – 2021-04-18 (×2): qty 10, 30d supply, fill #1
  Filled 2021-05-23: qty 10, 30d supply, fill #0
  Filled 2021-07-31: qty 10, 30d supply, fill #1
  Filled 2021-12-28: qty 10, 30d supply, fill #2

## 2021-01-12 MED ORDER — GLIPIZIDE 10 MG PO TABS
10.0000 mg | ORAL_TABLET | Freq: Two times a day (BID) | ORAL | 0 refills | Status: DC
  Filled 2021-01-12: qty 60, 30d supply, fill #0

## 2021-01-16 ENCOUNTER — Other Ambulatory Visit: Payer: Self-pay

## 2021-01-17 ENCOUNTER — Other Ambulatory Visit: Payer: Self-pay

## 2021-01-19 ENCOUNTER — Other Ambulatory Visit: Payer: Self-pay

## 2021-02-04 ENCOUNTER — Other Ambulatory Visit: Payer: Self-pay

## 2021-02-04 ENCOUNTER — Emergency Department (HOSPITAL_COMMUNITY)
Admission: EM | Admit: 2021-02-04 | Discharge: 2021-02-04 | Disposition: A | Discharge status: Home / Self Care | Payer: Self-pay | Attending: Emergency Medicine | Admitting: Emergency Medicine

## 2021-02-04 ENCOUNTER — Encounter (HOSPITAL_COMMUNITY): Payer: Self-pay

## 2021-02-04 DIAGNOSIS — K859 Acute pancreatitis without necrosis or infection, unspecified: Secondary | ICD-10-CM | POA: Insufficient documentation

## 2021-02-04 DIAGNOSIS — E1129 Type 2 diabetes mellitus with other diabetic kidney complication: Secondary | ICD-10-CM | POA: Insufficient documentation

## 2021-02-04 DIAGNOSIS — Z7984 Long term (current) use of oral hypoglycemic drugs: Secondary | ICD-10-CM | POA: Insufficient documentation

## 2021-02-04 DIAGNOSIS — I10 Essential (primary) hypertension: Secondary | ICD-10-CM | POA: Insufficient documentation

## 2021-02-04 DIAGNOSIS — Z794 Long term (current) use of insulin: Secondary | ICD-10-CM | POA: Insufficient documentation

## 2021-02-04 DIAGNOSIS — Z79899 Other long term (current) drug therapy: Secondary | ICD-10-CM | POA: Insufficient documentation

## 2021-02-04 DIAGNOSIS — Z87891 Personal history of nicotine dependence: Secondary | ICD-10-CM | POA: Insufficient documentation

## 2021-02-04 LAB — URINALYSIS, ROUTINE W REFLEX MICROSCOPIC
Bacteria, UA: NONE SEEN
Bilirubin Urine: NEGATIVE
Glucose, UA: >=500 mg/dL — AB
Ketones, ur: 5 mg/dL — AB
Leukocytes,Ua: NEGATIVE
Nitrite: NEGATIVE
Protein, ur: 100 mg/dL — AB
Specific Gravity, Urine: 1.011 (ref 1.005–1.030)
pH: 7 (ref 5.0–8.0)

## 2021-02-04 LAB — COMPREHENSIVE METABOLIC PANEL
ALT: 17 U/L (ref 0–44)
AST: 30 U/L (ref 15–41)
Albumin: 3.3 g/dL — ABNORMAL LOW (ref 3.5–5.0)
Alkaline Phosphatase: 88 U/L (ref 38–126)
Anion gap: 8 (ref 5–15)
BUN: 18 mg/dL (ref 6–20)
CO2: 23 mmol/L (ref 22–32)
Calcium: 8.8 mg/dL — ABNORMAL LOW (ref 8.9–10.3)
Chloride: 104 mmol/L (ref 98–111)
Creatinine, Ser: 1.28 mg/dL — ABNORMAL HIGH (ref 0.61–1.24)
GFR, Estimated: >60 mL/min (ref >60)
Glucose, Bld: 225 mg/dL — ABNORMAL HIGH (ref 70–99)
Potassium: 4.6 mmol/L (ref 3.5–5.1)
Sodium: 135 mmol/L (ref 135–145)
Total Bilirubin: 1 mg/dL (ref 0.3–1.2)
Total Protein: 7.3 g/dL (ref 6.5–8.1)

## 2021-02-04 LAB — CBC
HCT: 33.2 % — ABNORMAL LOW (ref 39.0–52.0)
Hemoglobin: 10.6 g/dL — ABNORMAL LOW (ref 13.0–17.0)
MCH: 27.5 pg (ref 26.0–34.0)
MCHC: 31.9 g/dL (ref 30.0–36.0)
MCV: 86 fL (ref 80.0–100.0)
Platelets: 360 10*3/uL (ref 150–400)
RBC: 3.86 MIL/uL — ABNORMAL LOW (ref 4.22–5.81)
RDW: 12.6 % (ref 11.5–15.5)
WBC: 8.7 10*3/uL (ref 4.0–10.5)
nRBC: 0 % (ref 0.0–0.2)

## 2021-02-04 LAB — LIPASE, BLOOD: Lipase: 400 U/L — ABNORMAL HIGH (ref 11–51)

## 2021-02-04 MED ORDER — ONDANSETRON HCL 4 MG/2ML IJ SOLN
4.0000 mg | Freq: Once | INTRAMUSCULAR | Status: AC
  Administered 2021-02-04: 4 mg via INTRAVENOUS
  Filled 2021-02-04: qty 2

## 2021-02-04 MED ORDER — ONDANSETRON 4 MG PO TBDP: 4.0000 mg | ORAL_TABLET | Freq: Three times a day (TID) | ORAL | 0 refills | Status: DC | PRN

## 2021-02-04 MED ORDER — HYDROMORPHONE HCL 1 MG/ML IJ SOLN
1.0000 mg | Freq: Once | INTRAMUSCULAR | Status: AC
  Administered 2021-02-04: 1 mg via INTRAVENOUS
  Filled 2021-02-04: qty 1

## 2021-02-04 MED ORDER — AMLODIPINE BESYLATE 5 MG PO TABS
10.0000 mg | ORAL_TABLET | Freq: Once | ORAL | Status: AC
  Administered 2021-02-04: 10 mg via ORAL
  Filled 2021-02-04: qty 2

## 2021-02-04 MED ORDER — SODIUM CHLORIDE 0.9% FLUSH: 3.0000 mL | Freq: Once | INTRAVENOUS | Status: DC

## 2021-02-04 MED ORDER — ONDANSETRON 4 MG PO TBDP
4.0000 mg | ORAL_TABLET | Freq: Once | ORAL | Status: AC | PRN
  Administered 2021-02-04: 4 mg via ORAL
  Filled 2021-02-04: qty 1

## 2021-02-04 MED ORDER — SODIUM CHLORIDE 0.9 % IV BOLUS
1000.0000 mL | Freq: Once | INTRAVENOUS | Status: AC
  Administered 2021-02-04: 1000 mL via INTRAVENOUS

## 2021-02-04 MED ORDER — HYDROCODONE-ACETAMINOPHEN 5-325 MG PO TABS: 1.0000 | ORAL_TABLET | ORAL | 0 refills | Status: DC | PRN

## 2021-02-04 NOTE — ED Provider Notes (Signed)
[facility] EMERGENCY DEPARTMENT Provider Note   CSN: [id number] Arrival date & time: 02/04/21  0456     History Chief Complaint  Patient presents with   Abdominal Pain    Randy [name]. is a 40 y.o. male.  40 year old male with past medical history of insulin-dependent diabetes, hypertension, AKI and pancreatitis presents with complaint of pain in his back which is the pain Randy [name] typically has when his pancreatitis flares.  Also reports his blood pressure is elevated and has been having slight headaches without any visual disturbance, unilateral weakness or numbness.  Symptoms have been intermittent, previously admitted in May for pancreatitis.  Does not feel as if his pain is as bad as that event and feels like Randy [name] may be able to go home after some IV fluids, pain medication and blood pressure control.  States Randy [name] did run out of his amlodipine about 2 weeks ago, was without his medication for about a week and started again 1 week ago.  Denies nausea, vomiting, fevers, chills.  No other complaints or concerns today.      Past Medical History:  Diagnosis Date   Essential hypertension 05/31/2019   HLD (hyperlipidemia)    Hypertension    Pancreatitis    Type II diabetes mellitus ([place])     Patient Active Problem List   Diagnosis Date Noted   Acute pancreatitis 09/17/2020   Abnormal urinalysis 09/17/2020   Intractable nausea and vomiting 09/17/2020   Hypertensive crisis 09/12/2020   Nausea & vomiting 09/12/2020   AKI (acute kidney injury) ([place]) 09/12/2020   GERD (gastroesophageal reflux disease) 09/12/2020   Type 2 diabetes mellitus without complication, with long-term current use of insulin ([place])    Hyperlipidemia 06/01/2019   Microalbuminuria due to type 2 diabetes mellitus ([place]) 06/01/2019   Essential hypertension 05/31/2019   Hypertensive urgency 08/31/2015   Type 2 diabetes mellitus with hyperglycemia, with long-term current use of insulin ([place])  08/31/2015   Marijuana abuse 08/31/2015    Past Surgical History:  Procedure Laterality Date   NO PAST SURGERIES         Family History  Problem Relation Age of Onset   Hypertension Mother    Diabetes Father    Hypertension Father    Diabetes Brother    Diabetes Maternal Grandmother    Colon cancer Neg Hx    Esophageal cancer Neg Hx    Rectal cancer Neg Hx    Stomach cancer Neg Hx     Social History   Tobacco Use   Smoking status: Former    Packs/day: 0.33    Years: 17.00    Pack years: 5.61    Types: Cigarettes    Quit date: 12/26/2011    Years since quitting: 9.1   Smokeless tobacco: Never  Vaping Use   Vaping Use: Never used  Substance Use Topics   Alcohol use: No    Comment: none in6-7 years   Drug use: Yes    Types: Marijuana    Comment: Nov 2020    Home Medications Prior to Admission medications   Medication Sig Start Date End Date Taking? Authorizing Provider  HYDROcodone-acetaminophen (NORCO/VICODIN) 5-325 MG tablet Take 1 tablet by mouth every 4 (four) hours as needed. 02/04/21  Yes Tacy Learn, PA-C  ondansetron (ZOFRAN ODT) 4 MG disintegrating tablet Take 1 tablet (4 mg total) by mouth every 8 (eight) hours as needed for nausea or vomiting. 02/04/21  Yes Tacy Learn, PA-C  amLODipine ([place])  10 MG tablet Take 1 tablet (10 mg total) by mouth daily. 01/12/21 02/18/21  Dorna Mai, MD  atorvastatin (LIPITOR) 80 MG tablet Take 1 tablet (80 mg total) by mouth daily. 11/02/20 01/31/21  Camillia Herter, NP  Blood Glucose Monitoring Suppl (TRUE METRIX METER) w/Device KIT Use as directed 10/30/20   Camillia Herter, NP  carvedilol (COREG) 25 MG tablet Take 1 tablet (25 mg total) by mouth 2 (two) times daily with a meal. 01/12/21 02/18/21  Dorna Mai, MD  cephALEXin (KEFLEX) 500 MG capsule Take 1 capsule (500 mg total) by mouth 4 (four) times daily. 09/14/20   Daleen Bo, MD  glipiZIDE (GLUCOTROL) 10 MG tablet Take 1 tablet (10 mg total) by mouth 2 (two)  times daily before a meal. 01/12/21 02/18/21  Dorna Mai, MD  glucose blood (TRUE METRIX BLOOD GLUCOSE TEST) test strip Use as instructed 10/30/20   Camillia Herter, NP  insulin glargine (LANTUS) 100 UNIT/ML Solostar Pen Inject 30 Units into the skin daily. 10/30/20 12/09/20  Camillia Herter, NP  Insulin Pen Needle (PEN NEEDLES) 31G X 8 MM MISC USE AS DIRECTED 10/30/20   Camillia Herter, NP  lisinopril (ZESTRIL) 40 MG tablet Take 1 tablet (40 mg total) by mouth daily. 01/12/21 02/18/21  Dorna Mai, MD  omeprazole (PRILOSEC) 40 MG capsule Take 1 capsule (40 mg total) by mouth daily. 09/13/20   Geradine Girt, DO  sildenafil (VIAGRA) 100 MG tablet TAKE 0.5-1 TABLETS (50-100 MG TOTAL) BY MOUTH DAILY AS NEEDED FOR ERECTILE DYSFUNCTION. 01/12/21 01/12/22  Dorna Mai, MD  TRUEplus Lancets 28G MISC Use as directed 10/30/20   Camillia Herter, NP    Allergies    Tomato  Review of Systems   Review of Systems  Constitutional:  Negative for chills, diaphoresis and fever.  Respiratory:  Negative for shortness of breath.   Cardiovascular:  Negative for chest pain.  Gastrointestinal:  Positive for abdominal pain. Negative for constipation, diarrhea, nausea and vomiting.  Genitourinary:  Negative for difficulty urinating and dysuria.  Musculoskeletal:  Positive for back pain.  Skin:  Negative for rash and wound.  Allergic/Immunologic: Positive for immunocompromised state.  Neurological:  Positive for headaches. Negative for weakness and numbness.  Hematological:  Negative for adenopathy.  Psychiatric/Behavioral:  Negative for confusion.   All other systems reviewed and are negative.  Physical Exam Updated Vital Signs BP (!) 187/95   Pulse 81   Temp 98.4 F (36.9 C) (Oral)   Resp 12   Ht '5\' 8"'  (1.727 m)   Wt 86.2 kg   SpO2 100%   BMI 28.89 kg/m   Physical Exam Vitals and nursing note reviewed.  Constitutional:      General: Randy [name] is not in acute distress.    Appearance: Randy [name] is well-developed. Randy [name]  is not diaphoretic.  HENT:     Head: Normocephalic and atraumatic.     Mouth/Throat:     Mouth: Mucous membranes are moist.  Eyes:     Pupils: Pupils are equal, round, and reactive to light.  Cardiovascular:     Rate and Rhythm: Normal rate and regular rhythm.     Heart sounds: Normal heart sounds.  Pulmonary:     Effort: Pulmonary effort is normal.     Breath sounds: Normal breath sounds.  Abdominal:     Palpations: Abdomen is soft.     Tenderness: There is no abdominal tenderness. There is no right CVA tenderness or left CVA tenderness.  Musculoskeletal:  General: No tenderness. Normal range of motion.     Cervical back: Neck supple.     Right lower leg: No edema.     Left lower leg: No edema.  Skin:    General: Skin is warm and dry.  Neurological:     Mental Status: Randy [name] is alert and oriented to person, place, and time.  Psychiatric:        Behavior: Behavior normal.    ED Results / Procedures / Treatments   Labs (all labs ordered are listed, but only abnormal results are displayed) Labs Reviewed  LIPASE, BLOOD - Abnormal; Notable for the following components:      Result Value   Lipase 400 (*)    All other components within normal limits  COMPREHENSIVE METABOLIC PANEL - Abnormal; Notable for the following components:   Glucose, Bld 225 (*)    Creatinine, Ser 1.28 (*)    Calcium 8.8 (*)    Albumin 3.3 (*)    All other components within normal limits  CBC - Abnormal; Notable for the following components:   RBC 3.86 (*)    Hemoglobin 10.6 (*)    HCT 33.2 (*)    All other components within normal limits  URINALYSIS, ROUTINE W REFLEX MICROSCOPIC    EKG None  Radiology No results found.  Procedures Procedures   Medications Ordered in ED Medications  sodium chloride flush (NS) 0.9 % injection 3 mL (3 mLs Intravenous Not Given 02/04/21 1141)  ondansetron (ZOFRAN-ODT) disintegrating tablet 4 mg (4 mg Oral Given 02/04/21 0552)  sodium chloride 0.9 % bolus  1,000 mL (1,000 mLs Intravenous New Bag/Given 02/04/21 1141)  HYDROmorphone (DILAUDID) injection 1 mg (1 mg Intravenous Given 02/04/21 1132)  ondansetron (ZOFRAN) injection 4 mg (4 mg Intravenous Given 02/04/21 1133)  amLODipine (NORVASC) tablet 10 mg (10 mg Oral Given 02/04/21 1125)    ED Course  I have reviewed the triage vital signs and the nursing notes.  Pertinent labs & imaging results that were available during my care of the patient were reviewed by me and considered in my medical decision making (see chart for details).  Clinical Course as of 02/04/21 1228  Sun Feb 04, 3073  6053 40 year old male with complaint of likely pancreatitis flare due to pain in his back which is similar to prior presentations.  Randy [name] denies fevers, chills, nausea, vomiting.  His abdomen is soft and nontender, no musculoskeletal tenderness of the back. Also complains of elevated blood pressure after being out of his blood pressure medication for a week, has been compliant for the past week although did not take his blood pressure medication this morning as Randy [name] has been in the emergency room. Denies chest pain, shortness of breath, unilateral weakness or numbness.  Patient's blood pressure improved after single dose of his home blood pressure medication.  Advised to continue monitoring his blood pressure and recheck with his primary care provider for this.  Review of his labs, CBC with normal white blood cell count, hemoglobin today is 10.6.  Not significantly changed from prior hemoglobins on file. CMP with glucose of 225, patient is an insulin-dependent diabetic, will treat this with IV fluids today.  His creatinine is 1.28 which appears baseline compared to priors. Lipase is elevated today at 400. Review of vitals, blood pressure has improved, O2 sat 100% on room air.  Patient is afebrile.  Discussed results with patient, Randy [name] feels his pain has improved and Randy [name] would like to try discharge at home to manage  with pain  medication at home and pancreatic diet.  Patient was given strict return to ER precautions.  Did offer admission today however patient declines.  Review of prior charts, has had several CT scans in the past year including a CTA.  Prior imaging shows mild pancreatic changes suggestive of acute pancreatitis in the past.  Patient verbalizes understanding of discharge instructions and plan. [LM]    Clinical Course User Index [LM] Roque Lias   MDM Rules/Calculators/A&P                           Final Clinical Impression(s) / ED Diagnoses Final diagnoses:  Acute pancreatitis, unspecified complication status, unspecified pancreatitis type  Hypertension, unspecified type    Rx / DC Orders ED Discharge Orders          Ordered    HYDROcodone-acetaminophen (NORCO/VICODIN) 5-325 MG tablet  Every 4 hours PRN        02/04/21 1223    ondansetron (ZOFRAN ODT) 4 MG disintegrating tablet  Every 8 hours PRN        02/04/21 1223             Tacy Learn, PA-C 02/04/21 1228    Valarie Merino, MD 02/05/21 2358

## 2021-02-04 NOTE — Discharge Instructions (Signed)
See diet instructions for pancreatitis eating plan.  For now, recommend clear liquids, advance slowly as pain improves. Take Norco as needed as prescribed for pain.  Do not drive or operate machinery while taking this.  This medication can cause constipation, take MiraLAX and Colace as needed as directed. Take your blood pressure medication daily as prescribed, monitor your blood pressure. Follow-up with your primary care provider this week for recheck.  Return to the emergency room at anytime for any worsening or concerning symptoms.

## 2021-02-04 NOTE — ED Triage Notes (Signed)
Pt states that he thinks he pancreas swelled up again. Pt endorsed that he pancreas was swollen about 3 months ago. Pt endorses epigastric pain, N/V as well as back pain and stomach pain. Pt states no hx oc alcohol use.

## 2021-02-06 ENCOUNTER — Other Ambulatory Visit: Payer: Self-pay

## 2021-02-21 ENCOUNTER — Encounter (HOSPITAL_COMMUNITY): Payer: Self-pay | Admitting: Emergency Medicine

## 2021-02-21 ENCOUNTER — Emergency Department (HOSPITAL_COMMUNITY)
Admission: EM | Admit: 2021-02-21 | Discharge: 2021-02-22 | Disposition: A | Discharge status: Home / Self Care | Payer: Self-pay | Attending: Emergency Medicine | Admitting: Emergency Medicine

## 2021-02-21 ENCOUNTER — Other Ambulatory Visit: Payer: Self-pay

## 2021-02-21 DIAGNOSIS — Z87891 Personal history of nicotine dependence: Secondary | ICD-10-CM | POA: Insufficient documentation

## 2021-02-21 DIAGNOSIS — K859 Acute pancreatitis without necrosis or infection, unspecified: Secondary | ICD-10-CM | POA: Insufficient documentation

## 2021-02-21 DIAGNOSIS — Z7984 Long term (current) use of oral hypoglycemic drugs: Secondary | ICD-10-CM | POA: Insufficient documentation

## 2021-02-21 DIAGNOSIS — K219 Gastro-esophageal reflux disease without esophagitis: Secondary | ICD-10-CM | POA: Insufficient documentation

## 2021-02-21 DIAGNOSIS — E1129 Type 2 diabetes mellitus with other diabetic kidney complication: Secondary | ICD-10-CM | POA: Insufficient documentation

## 2021-02-21 DIAGNOSIS — I10 Essential (primary) hypertension: Secondary | ICD-10-CM | POA: Insufficient documentation

## 2021-02-21 DIAGNOSIS — Z794 Long term (current) use of insulin: Secondary | ICD-10-CM | POA: Insufficient documentation

## 2021-02-21 DIAGNOSIS — Z79899 Other long term (current) drug therapy: Secondary | ICD-10-CM | POA: Insufficient documentation

## 2021-02-21 LAB — CBC WITH DIFFERENTIAL/PLATELET
Abs Immature Granulocytes: 0.01 10*3/uL (ref 0.00–0.07)
Basophils Absolute: 0.1 10*3/uL (ref 0.0–0.1)
Basophils Relative: 1 %
Eosinophils Absolute: 0.2 10*3/uL (ref 0.0–0.5)
Eosinophils Relative: 2 %
HCT: 30.5 % — ABNORMAL LOW (ref 39.0–52.0)
Hemoglobin: 9.8 g/dL — ABNORMAL LOW (ref 13.0–17.0)
Immature Granulocytes: 0 %
Lymphocytes Relative: 35 %
Lymphs Abs: 2.7 10*3/uL (ref 0.7–4.0)
MCH: 27.4 pg (ref 26.0–34.0)
MCHC: 32.1 g/dL (ref 30.0–36.0)
MCV: 85.2 fL (ref 80.0–100.0)
Monocytes Absolute: 0.5 10*3/uL (ref 0.1–1.0)
Monocytes Relative: 6 %
Neutro Abs: 4.1 10*3/uL (ref 1.7–7.7)
Neutrophils Relative %: 56 %
Platelets: 296 10*3/uL (ref 150–400)
RBC: 3.58 MIL/uL — ABNORMAL LOW (ref 4.22–5.81)
RDW: 12.4 % (ref 11.5–15.5)
WBC: 7.5 10*3/uL (ref 4.0–10.5)
nRBC: 0 % (ref 0.0–0.2)

## 2021-02-21 LAB — COMPREHENSIVE METABOLIC PANEL
ALT: 14 U/L (ref 0–44)
AST: 26 U/L (ref 15–41)
Albumin: 3.1 g/dL — ABNORMAL LOW (ref 3.5–5.0)
Alkaline Phosphatase: 94 U/L (ref 38–126)
Anion gap: 6 (ref 5–15)
BUN: 16 mg/dL (ref 6–20)
CO2: 27 mmol/L (ref 22–32)
Calcium: 8.5 mg/dL — ABNORMAL LOW (ref 8.9–10.3)
Chloride: 101 mmol/L (ref 98–111)
Creatinine, Ser: 1.11 mg/dL (ref 0.61–1.24)
GFR, Estimated: >60 mL/min (ref >60)
Glucose, Bld: 168 mg/dL — ABNORMAL HIGH (ref 70–99)
Potassium: 3.9 mmol/L (ref 3.5–5.1)
Sodium: 134 mmol/L — ABNORMAL LOW (ref 135–145)
Total Bilirubin: 1 mg/dL (ref 0.3–1.2)
Total Protein: 6.7 g/dL (ref 6.5–8.1)

## 2021-02-21 LAB — ETHANOL: Alcohol, Ethyl (B): <10 mg/dL (ref <10)

## 2021-02-21 LAB — LIPASE, BLOOD: Lipase: 205 U/L — ABNORMAL HIGH (ref 11–51)

## 2021-02-21 NOTE — ED Triage Notes (Signed)
Per EMS, pt has had abd pn X4 days, feels it is his pancreas pain again.  Denies ETOH.  Was given 50mg  fentanyl and 4 mg of zofran which temporarily relieved the pain.

## 2021-02-21 NOTE — ED Provider Notes (Signed)
Emergency Medicine Provider Triage Evaluation Note  Randy [name]. , a 40 y.o. male  was evaluated in triage.  Pt complains of 4 days of upper abdominal pain, feels like prior pancreatitis.  Last saw GI about 1 year ago.  Given zofran and fentanyl with EMS with improvement.  Denies recent EtOH or hx of gallstones.  Review of Systems  Positive: Abdominal pain, nausea Negative: fever  Physical Exam  BP (!) 215/109 (BP Location: Right Arm)   Pulse 74   Temp 98.6 F (37 C) (Oral)   Resp 19   Ht 5\' 8"  (1.727 m)   Wt 86.2 kg   SpO2 99%   BMI 28.89 kg/m   Gen:   Awake, no distress   Resp:  Normal effort  MSK:   Moves extremities without difficulty  Other:  Epigastric tenderness, no peritoneal signs  Medical Decision Making  Medically screening exam initiated at 10:03 PM.  Appropriate orders placed.  Randy [name] . was informed that the remainder of the evaluation will be completed by another provider, this initial triage assessment does not replace that evaluation, and the importance of remaining in the ED until their evaluation is complete.  Possible recurrent pancreatitis.  Labs sent.   [place], PA-C 02/21/21 2238    2239, MD 02/28/21 [phone]

## 2021-02-22 LAB — URINALYSIS, ROUTINE W REFLEX MICROSCOPIC
Bilirubin Urine: NEGATIVE
Glucose, UA: 150 mg/dL — AB
Ketones, ur: 5 mg/dL — AB
Nitrite: NEGATIVE
Protein, ur: >=300 mg/dL — AB
Specific Gravity, Urine: 1.017 (ref 1.005–1.030)
pH: 7 (ref 5.0–8.0)

## 2021-02-22 MED ORDER — HYDROMORPHONE HCL 1 MG/ML IJ SOLN
0.5000 mg | Freq: Once | INTRAMUSCULAR | Status: AC
Start: 2021-02-22 — End: 2021-02-22
  Administered 2021-02-22: 0.5 mg via INTRAVENOUS
  Filled 2021-02-22: qty 1

## 2021-02-22 MED ORDER — ONDANSETRON HCL 4 MG/2ML IJ SOLN
4.0000 mg | Freq: Once | INTRAMUSCULAR | Status: AC
  Administered 2021-02-22: 4 mg via INTRAVENOUS
  Filled 2021-02-22: qty 2

## 2021-02-22 MED ORDER — HYDROCODONE-ACETAMINOPHEN 5-325 MG PO TABS
1.0000 | ORAL_TABLET | Freq: Once | ORAL | Status: AC
  Administered 2021-02-22: 1 via ORAL
  Filled 2021-02-22: qty 1

## 2021-02-22 MED ORDER — HYDROMORPHONE HCL 1 MG/ML IJ SOLN
1.0000 mg | Freq: Once | INTRAMUSCULAR | Status: AC
  Administered 2021-02-22: 1 mg via INTRAVENOUS
  Filled 2021-02-22: qty 1

## 2021-02-22 MED ORDER — HYDROCODONE-ACETAMINOPHEN 5-325 MG PO TABS: 1.0000 | ORAL_TABLET | Freq: Four times a day (QID) | ORAL | 0 refills | Status: DC | PRN

## 2021-02-22 NOTE — ED Provider Notes (Signed)
[facility] EMERGENCY DEPARTMENT Provider Note   CSN: [id number] Arrival date & time: 02/21/21  2202     History Chief Complaint  Patient presents with   Abdominal Pain    Randy [name]. is a 40 y.o. male.  The history is provided by the patient.  Abdominal Pain Pain location:  Epigastric Pain quality: sharp   Pain radiates to:  Back Pain severity:  Severe Onset quality:  Gradual Duration:  4 days Timing:  Intermittent Progression:  Worsening Chronicity:  Recurrent Relieved by:  Nothing Worsened by:  Movement and palpation Associated symptoms: nausea and vomiting   Associated symptoms: no diarrhea, no dysuria, no fever and no shortness of breath   Patient presents for abdominal pain and back pain.  Patient has previous history of pancreatitis.  Reports the pain is in his upper abdomen and into his back.  Reports associated nonbloody emesis.  No diarrhea.  This feels similar to prior episodes.  He denies alcohol use    Past Medical History:  Diagnosis Date   Essential hypertension 05/31/2019   HLD (hyperlipidemia)    Hypertension    Pancreatitis    Type II diabetes mellitus ([place])     Patient Active Problem List   Diagnosis Date Noted   Acute pancreatitis 09/17/2020   Abnormal urinalysis 09/17/2020   Intractable nausea and vomiting 09/17/2020   Hypertensive crisis 09/12/2020   Nausea & vomiting 09/12/2020   AKI (acute kidney injury) ([place]) 09/12/2020   GERD (gastroesophageal reflux disease) 09/12/2020   Type 2 diabetes mellitus without complication, with long-term current use of insulin ([place])    Hyperlipidemia 06/01/2019   Microalbuminuria due to type 2 diabetes mellitus ([place]) 06/01/2019   Essential hypertension 05/31/2019   Hypertensive urgency 08/31/2015   Type 2 diabetes mellitus with hyperglycemia, with long-term current use of insulin ([place]) 08/31/2015   Marijuana abuse 08/31/2015    Past Surgical History:  Procedure Laterality  Date   NO PAST SURGERIES         Family History  Problem Relation Age of Onset   Hypertension Mother    Diabetes Father    Hypertension Father    Diabetes Brother    Diabetes Maternal Grandmother    Colon cancer Neg Hx    Esophageal cancer Neg Hx    Rectal cancer Neg Hx    Stomach cancer Neg Hx     Social History   Tobacco Use   Smoking status: Former    Packs/day: 0.33    Years: 17.00    Pack years: 5.61    Types: Cigarettes    Quit date: 12/26/2011    Years since quitting: 9.1   Smokeless tobacco: Never  Vaping Use   Vaping Use: Never used  Substance Use Topics   Alcohol use: No    Comment: none in6-7 years   Drug use: Yes    Types: Marijuana    Comment: Nov 2020    Home Medications Prior to Admission medications   Medication Sig Start Date End Date Taking? Authorizing Provider  HYDROcodone-acetaminophen (NORCO/VICODIN) 5-325 MG tablet Take 1 tablet by mouth every 6 (six) hours as needed. 02/22/21  Yes Ripley Fraise, MD  amLODipine (NORVASC) 10 MG tablet Take 1 tablet (10 mg total) by mouth daily. 01/12/21 02/18/21  Dorna Mai, MD  atorvastatin (LIPITOR) 80 MG tablet Take 1 tablet (80 mg total) by mouth daily. 11/02/20 01/31/21  Camillia Herter, NP  Blood Glucose Monitoring Suppl (TRUE METRIX METER) w/Device KIT  Use as directed 10/30/20   Camillia Herter, NP  carvedilol (COREG) 25 MG tablet Take 1 tablet (25 mg total) by mouth 2 (two) times daily with a meal. 01/12/21 02/18/21  Dorna Mai, MD  cephALEXin (KEFLEX) 500 MG capsule Take 1 capsule (500 mg total) by mouth 4 (four) times daily. 09/14/20   Daleen Bo, MD  glipiZIDE (GLUCOTROL) 10 MG tablet Take 1 tablet (10 mg total) by mouth 2 (two) times daily before a meal. 01/12/21 02/18/21  Dorna Mai, MD  glucose blood (TRUE METRIX BLOOD GLUCOSE TEST) test strip Use as instructed 10/30/20   Camillia Herter, NP  insulin glargine (LANTUS) 100 UNIT/ML Solostar Pen Inject 30 Units into the skin daily. 10/30/20 12/09/20   Camillia Herter, NP  Insulin Pen Needle (PEN NEEDLES) 31G X 8 MM MISC USE AS DIRECTED 10/30/20   Camillia Herter, NP  lisinopril (ZESTRIL) 40 MG tablet Take 1 tablet (40 mg total) by mouth daily. 01/12/21 02/18/21  Dorna Mai, MD  omeprazole (PRILOSEC) 40 MG capsule Take 1 capsule (40 mg total) by mouth daily. 09/13/20   Geradine Girt, DO  ondansetron (ZOFRAN ODT) 4 MG disintegrating tablet Take 1 tablet (4 mg total) by mouth every 8 (eight) hours as needed for nausea or vomiting. 02/04/21   Tacy Learn, PA-C  sildenafil (VIAGRA) 100 MG tablet TAKE 0.5-1 TABLETS (50-100 MG TOTAL) BY MOUTH DAILY AS NEEDED FOR ERECTILE DYSFUNCTION. 01/12/21 01/12/22  Dorna Mai, MD  TRUEplus Lancets 28G MISC Use as directed 10/30/20   Camillia Herter, NP    Allergies    Tomato  Review of Systems   Review of Systems  Constitutional:  Negative for fever.  Respiratory:  Negative for shortness of breath.   Gastrointestinal:  Positive for abdominal pain, nausea and vomiting. Negative for diarrhea.  Genitourinary:  Negative for dysuria.  All other systems reviewed and are negative.  Physical Exam Updated Vital Signs BP (!) 187/104   Pulse 73   Temp 98 F (36.7 C)   Resp 10   Ht 1.727 m ('5\' 8"' )   Wt 86.2 kg   SpO2 95%   BMI 28.89 kg/m   Physical Exam CONSTITUTIONAL: Well developed/well nourished, uncomfortable appearing HEAD: Normocephalic/atraumatic EYES: EOMI/PERRL, no icterus ENMT: Mucous membranes moist NECK: supple no meningeal signs SPINE/BACK:entire spine nontender CV: S1/S2 noted, no murmurs/rubs/gallops noted LUNGS: Lungs are clear to auscultation bilaterally, no apparent distress ABDOMEN: soft, moderate epigastric tenderness, no rebound or guarding, bowel sounds noted throughout abdomen GU:no cva tenderness NEURO: Pt is awake/alert/appropriate, moves all extremitiesx4.  No facial droop.   EXTREMITIES: pulses normal/equal, full ROM SKIN: warm, color normal PSYCH: Anxious  ED  Results / Procedures / Treatments   Labs (all labs ordered are listed, but only abnormal results are displayed) Labs Reviewed  CBC WITH DIFFERENTIAL/PLATELET - Abnormal; Notable for the following components:      Result Value   RBC 3.58 (*)    Hemoglobin 9.8 (*)    HCT 30.5 (*)    All other components within normal limits  COMPREHENSIVE METABOLIC PANEL - Abnormal; Notable for the following components:   Sodium 134 (*)    Glucose, Bld 168 (*)    Calcium 8.5 (*)    Albumin 3.1 (*)    All other components within normal limits  LIPASE, BLOOD - Abnormal; Notable for the following components:   Lipase 205 (*)    All other components within normal limits  URINALYSIS, ROUTINE W REFLEX MICROSCOPIC -  Abnormal; Notable for the following components:   Glucose, UA 150 (*)    Hgb urine dipstick SMALL (*)    Ketones, ur 5 (*)    Protein, ur >=300 (*)    Leukocytes,Ua TRACE (*)    Bacteria, UA RARE (*)    All other components within normal limits  ETHANOL    EKG EKG Interpretation  Date/Time:  Thursday February 22 2021 03:22:11 EDT Ventricular Rate:  75 PR Interval:  187 QRS Duration: 86 QT Interval:  387 QTC Calculation: 433 R Axis:   99 Text Interpretation: Sinus rhythm Borderline right axis deviation Consider left ventricular hypertrophy Confirmed by Ripley Fraise [phone]) on 02/22/2021 3:24:23 AM  Radiology No results found.  Procedures Procedures   Medications Ordered in ED Medications  HYDROcodone-acetaminophen (NORCO/VICODIN) 5-325 MG per tablet 1 tablet (has no administration in time range)  HYDROmorphone (DILAUDID) injection 1 mg (1 mg Intravenous Given 02/22/21 0428)  ondansetron (ZOFRAN) injection 4 mg (4 mg Intravenous Given 02/22/21 0426)  HYDROmorphone (DILAUDID) injection 0.5 mg (0.5 mg Intravenous Given 02/22/21 0554)  ondansetron (ZOFRAN) injection 4 mg (4 mg Intravenous Given 02/22/21 0553)    ED Course  I have reviewed the triage vital signs and the  nursing notes.  Pertinent labs & imaging results that were available during my care of the patient were reviewed by me and considered in my medical decision making (see chart for details).    MDM Rules/Calculators/A&P                           This patient presents to the ED for concern of abdominal pain, this involves an extensive number of treatment options, and is a complaint that carries with it a high risk of complications and morbidity.  The differential diagnosis includes pancreatitis, cholecystitis, appendicitis, bowel obstruction, bowel perforation   Lab Tests:  I Ordered, reviewed, and interpreted labs, which included lipase, metabolic panel, liver function panel, complete blood  Medicines ordered:  I ordered medication Dilaudid for pain  Additional history obtained:  Previous records obtained and reviewed patient with history of pancreatitis and recent CT imaging confirms  Reevaluation:  After the interventions stated above, I reevaluated the patient and found patient is improved. Blood pressure is mildly improved, though likely exacerbated due to his pain.  He reports med compliance. Other than abdominal pain and vomiting, patient had no other acute complaints.  He has had these episodes previously.  Labs consistent with mild pancreatitis. Patient plans to go on a liquid diet and avoid all greasy and fatty foods.  He does not drink alcohol.  We will provide a short course of pain medication. He will be referred to gastroenterology. Defer CT imaging for now as he is had multiple imaging modalities in the past year Final Clinical Impression(s) / ED Diagnoses Final diagnoses:  Acute pancreatitis, unspecified complication status, unspecified pancreatitis type    Rx / DC Orders ED Discharge Orders          Ordered    HYDROcodone-acetaminophen (NORCO/VICODIN) 5-325 MG tablet  Every 6 hours PRN        02/22/21 0647             Ripley Fraise, MD 02/22/21  [phone]

## 2021-03-22 ENCOUNTER — Emergency Department (HOSPITAL_COMMUNITY)
Admission: EM | Admit: 2021-03-22 | Discharge: 2021-03-23 | Disposition: A | Discharge status: Home / Self Care | Payer: Self-pay | Attending: Emergency Medicine | Admitting: Emergency Medicine

## 2021-03-22 ENCOUNTER — Emergency Department (HOSPITAL_COMMUNITY): Payer: Self-pay

## 2021-03-22 ENCOUNTER — Encounter (HOSPITAL_COMMUNITY): Payer: Self-pay

## 2021-03-22 DIAGNOSIS — R112 Nausea with vomiting, unspecified: Secondary | ICD-10-CM | POA: Insufficient documentation

## 2021-03-22 DIAGNOSIS — Z5321 Procedure and treatment not carried out due to patient leaving prior to being seen by health care provider: Secondary | ICD-10-CM | POA: Insufficient documentation

## 2021-03-22 DIAGNOSIS — R1013 Epigastric pain: Secondary | ICD-10-CM | POA: Insufficient documentation

## 2021-03-22 LAB — URINALYSIS, ROUTINE W REFLEX MICROSCOPIC
Bilirubin Urine: NEGATIVE
Glucose, UA: NEGATIVE mg/dL
Ketones, ur: 5 mg/dL — AB
Nitrite: NEGATIVE
Protein, ur: >=300 mg/dL — AB
Specific Gravity, Urine: 1.015 (ref 1.005–1.030)
pH: 5 (ref 5.0–8.0)

## 2021-03-22 LAB — CBC WITH DIFFERENTIAL/PLATELET
Abs Immature Granulocytes: 0.02 10*3/uL (ref 0.00–0.07)
Basophils Absolute: 0.1 10*3/uL (ref 0.0–0.1)
Basophils Relative: 1 %
Eosinophils Absolute: 0.2 10*3/uL (ref 0.0–0.5)
Eosinophils Relative: 2 %
HCT: 33.9 % — ABNORMAL LOW (ref 39.0–52.0)
Hemoglobin: 11 g/dL — ABNORMAL LOW (ref 13.0–17.0)
Immature Granulocytes: 0 %
Lymphocytes Relative: 29 %
Lymphs Abs: 2.4 10*3/uL (ref 0.7–4.0)
MCH: 27.4 pg (ref 26.0–34.0)
MCHC: 32.4 g/dL (ref 30.0–36.0)
MCV: 84.3 fL (ref 80.0–100.0)
Monocytes Absolute: 0.6 10*3/uL (ref 0.1–1.0)
Monocytes Relative: 7 %
Neutro Abs: 5 10*3/uL (ref 1.7–7.7)
Neutrophils Relative %: 61 %
Platelets: 339 10*3/uL (ref 150–400)
RBC: 4.02 MIL/uL — ABNORMAL LOW (ref 4.22–5.81)
RDW: 12.3 % (ref 11.5–15.5)
WBC: 8.3 10*3/uL (ref 4.0–10.5)
nRBC: 0 % (ref 0.0–0.2)

## 2021-03-22 LAB — COMPREHENSIVE METABOLIC PANEL
ALT: 16 U/L (ref 0–44)
AST: 24 U/L (ref 15–41)
Albumin: 3.4 g/dL — ABNORMAL LOW (ref 3.5–5.0)
Alkaline Phosphatase: 86 U/L (ref 38–126)
Anion gap: 9 (ref 5–15)
BUN: 23 mg/dL — ABNORMAL HIGH (ref 6–20)
CO2: 22 mmol/L (ref 22–32)
Calcium: 8.8 mg/dL — ABNORMAL LOW (ref 8.9–10.3)
Chloride: 102 mmol/L (ref 98–111)
Creatinine, Ser: 1.41 mg/dL — ABNORMAL HIGH (ref 0.61–1.24)
GFR, Estimated: >60 mL/min (ref >60)
Glucose, Bld: 115 mg/dL — ABNORMAL HIGH (ref 70–99)
Potassium: 4.5 mmol/L (ref 3.5–5.1)
Sodium: 133 mmol/L — ABNORMAL LOW (ref 135–145)
Total Bilirubin: 0.6 mg/dL (ref 0.3–1.2)
Total Protein: 7.4 g/dL (ref 6.5–8.1)

## 2021-03-22 LAB — LIPASE, BLOOD: Lipase: 74 U/L — ABNORMAL HIGH (ref 11–51)

## 2021-03-22 MED ORDER — MORPHINE SULFATE (PF) 4 MG/ML IV SOLN
4.0000 mg | Freq: Once | INTRAVENOUS | Status: AC
  Administered 2021-03-22: 4 mg via INTRAVENOUS
  Filled 2021-03-22: qty 1

## 2021-03-22 MED ORDER — IOHEXOL 300 MG/ML  SOLN
100.0000 mL | Freq: Once | INTRAMUSCULAR | Status: AC | PRN
  Administered 2021-03-22: 100 mL via INTRAVENOUS

## 2021-03-22 NOTE — ED Triage Notes (Addendum)
Pt BIB [facility] for eval of lower back pain and abd pain. Pt reports hx of pancreatitis, w/ recent dx of diabetes. Pt reports onset 5 hours PTA, sharp stabbing lower back pain.  SBP 233/112 on initial EMS contact. EMS admin 50 mcg fentanyl for pain w/ improvement of pressure from 180/90.

## 2021-03-22 NOTE — ED Provider Notes (Signed)
Emergency Medicine Provider Triage Evaluation Note  Randy [name]. , a 40 y.o. male  was evaluated in triage.  Pt complains of epigastric pain with radiation to the back that started yesterday.  Associated with nausea and vomiting, history of recent pancreatitis 1 month ago and states it feels like that..  Review of Systems  Positive: Epigastric pain, back pain, nausea, vomiting Negative: Chest pain, dysuria, hematuria  Physical Exam  BP (!) 189/116 (BP Location: Right Arm)   Pulse 77   Temp 98.5 F (36.9 C)   Resp 20   Ht 5\' 8"  (1.727 m)   Wt 87 kg   SpO2 100%   BMI 29.16 kg/m  Gen:   Awake, no distress   Resp:  Normal effort  MSK:   Moves extremities without difficulty  Other:  Epigastric tenderness, abdomen is soft  Medical Decision Making  Medically screening exam initiated at 6:38 PM.  Appropriate orders placed.  Randy [name] . was informed that the remainder of the evaluation will be completed by another provider, this initial triage assessment does not replace that evaluation, and the importance of remaining in the ED until their evaluation is complete.  Abdominal labs, CT   [place], Theron Arista 03/22/21 13/10/22, MD 03/22/21 2242

## 2021-03-28 ENCOUNTER — Other Ambulatory Visit: Payer: Self-pay

## 2021-03-28 ENCOUNTER — Emergency Department (HOSPITAL_COMMUNITY)
Admission: EM | Admit: 2021-03-28 | Discharge: 2021-03-28 | Disposition: A | Discharge status: Home / Self Care | Payer: Self-pay | Attending: Emergency Medicine | Admitting: Emergency Medicine

## 2021-03-28 DIAGNOSIS — I10 Essential (primary) hypertension: Secondary | ICD-10-CM | POA: Insufficient documentation

## 2021-03-28 DIAGNOSIS — K219 Gastro-esophageal reflux disease without esophagitis: Secondary | ICD-10-CM | POA: Insufficient documentation

## 2021-03-28 DIAGNOSIS — Z794 Long term (current) use of insulin: Secondary | ICD-10-CM | POA: Insufficient documentation

## 2021-03-28 DIAGNOSIS — R112 Nausea with vomiting, unspecified: Secondary | ICD-10-CM | POA: Insufficient documentation

## 2021-03-28 DIAGNOSIS — E1165 Type 2 diabetes mellitus with hyperglycemia: Secondary | ICD-10-CM | POA: Insufficient documentation

## 2021-03-28 DIAGNOSIS — R1084 Generalized abdominal pain: Secondary | ICD-10-CM | POA: Insufficient documentation

## 2021-03-28 DIAGNOSIS — Z7984 Long term (current) use of oral hypoglycemic drugs: Secondary | ICD-10-CM | POA: Insufficient documentation

## 2021-03-28 DIAGNOSIS — Z87891 Personal history of nicotine dependence: Secondary | ICD-10-CM | POA: Insufficient documentation

## 2021-03-28 DIAGNOSIS — Z79899 Other long term (current) drug therapy: Secondary | ICD-10-CM | POA: Insufficient documentation

## 2021-03-28 LAB — URINALYSIS, ROUTINE W REFLEX MICROSCOPIC
Bilirubin Urine: NEGATIVE
Glucose, UA: 150 mg/dL — AB
Hgb urine dipstick: NEGATIVE
Ketones, ur: NEGATIVE mg/dL
Leukocytes,Ua: NEGATIVE
Nitrite: NEGATIVE
Protein, ur: 100 mg/dL — AB
Specific Gravity, Urine: 1.015 (ref 1.005–1.030)
pH: 7 (ref 5.0–8.0)

## 2021-03-28 LAB — COMPREHENSIVE METABOLIC PANEL
ALT: 25 U/L (ref 0–44)
AST: 35 U/L (ref 15–41)
Albumin: 3.2 g/dL — ABNORMAL LOW (ref 3.5–5.0)
Alkaline Phosphatase: 92 U/L (ref 38–126)
Anion gap: 8 (ref 5–15)
BUN: 18 mg/dL (ref 6–20)
CO2: 23 mmol/L (ref 22–32)
Calcium: 8.8 mg/dL — ABNORMAL LOW (ref 8.9–10.3)
Chloride: 101 mmol/L (ref 98–111)
Creatinine, Ser: 1.44 mg/dL — ABNORMAL HIGH (ref 0.61–1.24)
GFR, Estimated: >60 mL/min (ref >60)
Glucose, Bld: 203 mg/dL — ABNORMAL HIGH (ref 70–99)
Potassium: 5.4 mmol/L — ABNORMAL HIGH (ref 3.5–5.1)
Sodium: 132 mmol/L — ABNORMAL LOW (ref 135–145)
Total Bilirubin: 1.3 mg/dL — ABNORMAL HIGH (ref 0.3–1.2)
Total Protein: 6.9 g/dL (ref 6.5–8.1)

## 2021-03-28 LAB — CBC
HCT: 36.8 % — ABNORMAL LOW (ref 39.0–52.0)
Hemoglobin: 11.4 g/dL — ABNORMAL LOW (ref 13.0–17.0)
MCH: 27.3 pg (ref 26.0–34.0)
MCHC: 31 g/dL (ref 30.0–36.0)
MCV: 88.2 fL (ref 80.0–100.0)
Platelets: 291 10*3/uL (ref 150–400)
RBC: 4.17 MIL/uL — ABNORMAL LOW (ref 4.22–5.81)
RDW: 12.4 % (ref 11.5–15.5)
WBC: 7.4 10*3/uL (ref 4.0–10.5)
nRBC: 0 % (ref 0.0–0.2)

## 2021-03-28 LAB — LIPASE, BLOOD: Lipase: 65 U/L — ABNORMAL HIGH (ref 11–51)

## 2021-03-28 MED ORDER — ONDANSETRON 4 MG PO TBDP
8.0000 mg | ORAL_TABLET | Freq: Once | ORAL | Status: AC
  Administered 2021-03-28: 8 mg via ORAL
  Filled 2021-03-28: qty 2

## 2021-03-28 MED ORDER — OXYCODONE-ACETAMINOPHEN 5-325 MG PO TABS
1.0000 | ORAL_TABLET | Freq: Four times a day (QID) | ORAL | 0 refills | Status: DC | PRN
  Filled 2021-03-28: qty 15, 4d supply, fill #0

## 2021-03-28 MED ORDER — HYDROCODONE-ACETAMINOPHEN 5-325 MG PO TABS: 1.0000 | ORAL_TABLET | Freq: Four times a day (QID) | ORAL | 0 refills | Status: DC | PRN

## 2021-03-28 MED ORDER — CARVEDILOL 12.5 MG PO TABS
25.0000 mg | ORAL_TABLET | Freq: Once | ORAL | Status: AC
  Administered 2021-03-28: 25 mg via ORAL
  Filled 2021-03-28: qty 2

## 2021-03-28 MED ORDER — OXYCODONE-ACETAMINOPHEN 5-325 MG PO TABS: 1.0000 | ORAL_TABLET | Freq: Four times a day (QID) | ORAL | 0 refills | Status: DC | PRN

## 2021-03-28 MED ORDER — MORPHINE SULFATE (PF) 4 MG/ML IV SOLN
4.0000 mg | Freq: Once | INTRAVENOUS | Status: AC
  Administered 2021-03-28: 4 mg via INTRAMUSCULAR
  Filled 2021-03-28: qty 1

## 2021-03-28 MED ORDER — AMLODIPINE BESYLATE 5 MG PO TABS
10.0000 mg | ORAL_TABLET | Freq: Once | ORAL | Status: AC
  Administered 2021-03-28: 10 mg via ORAL
  Filled 2021-03-28: qty 2

## 2021-03-28 MED ORDER — OXYCODONE-ACETAMINOPHEN 5-325 MG PO TABS
1.0000 | ORAL_TABLET | Freq: Once | ORAL | Status: AC
  Administered 2021-03-28: 1 via ORAL
  Filled 2021-03-28: qty 1

## 2021-03-28 MED ORDER — ONDANSETRON 4 MG PO TBDP
4.0000 mg | ORAL_TABLET | Freq: Three times a day (TID) | ORAL | 0 refills | Status: DC | PRN
  Filled 2021-03-28: qty 20, 7d supply, fill #0

## 2021-03-28 NOTE — ED Triage Notes (Signed)
Pt via EMS for abdominal pain x 2 years. Hx of pancreatitis but hasn't followed up with GI. No n/v, normal BMs.

## 2021-03-28 NOTE — ED Notes (Signed)
Patient given discharge instructions. Questions were answered. Patient verbalized understanding of discharge instructions and care at home.  

## 2021-03-28 NOTE — ED Provider Notes (Signed)
[facility] EMERGENCY DEPARTMENT Provider Note   CSN: [id number] Arrival date & time: 03/28/21  0831     History Chief Complaint  Patient presents with   Abdominal Pain    Randy [name]. is a 40 y.o. male.   Abdominal Pain Associated symptoms: nausea and vomiting   Associated symptoms: no chest pain, no chills, no constipation, no cough, no diarrhea, no dysuria, no fever, no hematuria, no shortness of breath and no sore throat    Patient presents with abdominal pain.  It started yesterday per the patient, its distributed in a pain like distribution across upper abdomen with radiation to the back.  Denies any flank pain, no dysuria or hematuria.  Reports the pain feels similar to episodes of pancreatitis, has been having the same type of pain off and on for 2 years.  He says he is followed up with GI once, has not had consistent follow-up.  Associated with nausea and emesis.  No history of prior abdominal surgeries.  Patient was seen 6 days ago on 03/22/2021.  CT scan was performed at that time without any findings consistent with pancreatitis.  Patient reports the pain feels exactly the same as it did at that time, reports this episode of pain only started yesterday however.  Patient has not taken his blood pressure medicine this morning.  Denies chest pain or shortness of breath.  Past Medical History:  Diagnosis Date   Essential hypertension 05/31/2019   HLD (hyperlipidemia)    Hypertension    Pancreatitis    Type II diabetes mellitus ([place])     Patient Active Problem List   Diagnosis Date Noted   Acute pancreatitis 09/17/2020   Abnormal urinalysis 09/17/2020   Intractable nausea and vomiting 09/17/2020   Hypertensive crisis 09/12/2020   Nausea & vomiting 09/12/2020   AKI (acute kidney injury) ([place]) 09/12/2020   GERD (gastroesophageal reflux disease) 09/12/2020   Type 2 diabetes mellitus without complication, with long-term current use of insulin  ([place])    Hyperlipidemia 06/01/2019   Microalbuminuria due to type 2 diabetes mellitus ([place]) 06/01/2019   Essential hypertension 05/31/2019   Hypertensive urgency 08/31/2015   Type 2 diabetes mellitus with hyperglycemia, with long-term current use of insulin ([place]) 08/31/2015   Marijuana abuse 08/31/2015    Past Surgical History:  Procedure Laterality Date   NO PAST SURGERIES         Family History  Problem Relation Age of Onset   Hypertension Mother    Diabetes Father    Hypertension Father    Diabetes Brother    Diabetes Maternal Grandmother    Colon cancer Neg Hx    Esophageal cancer Neg Hx    Rectal cancer Neg Hx    Stomach cancer Neg Hx     Social History   Tobacco Use   Smoking status: Former    Packs/day: 0.33    Years: 17.00    Pack years: 5.61    Types: Cigarettes    Quit date: 12/26/2011    Years since quitting: 9.2   Smokeless tobacco: Never  Vaping Use   Vaping Use: Never used  Substance Use Topics   Alcohol use: No    Comment: none in6-7 years   Drug use: Yes    Types: Marijuana    Comment: Nov 2020    Home Medications Prior to Admission medications   Medication Sig Start Date End Date Taking? Authorizing Provider  amLODipine (NORVASC) 10 MG tablet Take 1 tablet (  10 mg total) by mouth daily. 01/12/21 02/18/21  Dorna Mai, MD  atorvastatin (LIPITOR) 80 MG tablet Take 1 tablet (80 mg total) by mouth daily. 11/02/20 01/31/21  Camillia Herter, NP  Blood Glucose Monitoring Suppl (TRUE METRIX METER) w/Device KIT Use as directed 10/30/20   Camillia Herter, NP  carvedilol (COREG) 25 MG tablet Take 1 tablet (25 mg total) by mouth 2 (two) times daily with a meal. 01/12/21 02/18/21  Dorna Mai, MD  cephALEXin (KEFLEX) 500 MG capsule Take 1 capsule (500 mg total) by mouth 4 (four) times daily. 09/14/20   Daleen Bo, MD  glipiZIDE (GLUCOTROL) 10 MG tablet Take 1 tablet (10 mg total) by mouth 2 (two) times daily before a meal. 01/12/21 02/18/21  Dorna Mai, MD   glucose blood (TRUE METRIX BLOOD GLUCOSE TEST) test strip Use as instructed 10/30/20   Camillia Herter, NP  HYDROcodone-acetaminophen (NORCO/VICODIN) 5-325 MG tablet Take 1 tablet by mouth every 6 (six) hours as needed. 02/22/21   Ripley Fraise, MD  insulin glargine (LANTUS) 100 UNIT/ML Solostar Pen Inject 30 Units into the skin daily. 10/30/20 12/09/20  Camillia Herter, NP  Insulin Pen Needle (PEN NEEDLES) 31G X 8 MM MISC USE AS DIRECTED 10/30/20   Camillia Herter, NP  lisinopril (ZESTRIL) 40 MG tablet Take 1 tablet (40 mg total) by mouth daily. 01/12/21 02/18/21  Dorna Mai, MD  omeprazole (PRILOSEC) 40 MG capsule Take 1 capsule (40 mg total) by mouth daily. 09/13/20   Geradine Girt, DO  ondansetron (ZOFRAN ODT) 4 MG disintegrating tablet Take 1 tablet (4 mg total) by mouth every 8 (eight) hours as needed for nausea or vomiting. 02/04/21   Tacy Learn, PA-C  sildenafil (VIAGRA) 100 MG tablet TAKE 0.5-1 TABLETS (50-100 MG TOTAL) BY MOUTH DAILY AS NEEDED FOR ERECTILE DYSFUNCTION. 01/12/21 01/12/22  Dorna Mai, MD  TRUEplus Lancets 28G MISC Use as directed 10/30/20   Camillia Herter, NP    Allergies    Tomato  Review of Systems   Review of Systems  Constitutional:  Negative for chills and fever.  HENT:  Negative for ear pain and sore throat.   Respiratory:  Negative for cough and shortness of breath.   Cardiovascular:  Negative for chest pain and palpitations.  Gastrointestinal:  Positive for abdominal pain, nausea and vomiting. Negative for constipation and diarrhea.  Genitourinary:  Negative for dysuria and hematuria.  Musculoskeletal:  Positive for back pain. Negative for arthralgias.  Neurological:  Negative for syncope.  All other systems reviewed and are negative.  Physical Exam Updated Vital Signs BP (!) 210/118 (BP Location: Right Arm)   Pulse 80   Temp 98 F (36.7 C)   Resp 17   SpO2 100%   Physical Exam Vitals and nursing note reviewed.  Constitutional:       Appearance: He is well-developed.  HENT:     Head: Normocephalic and atraumatic.  Eyes:     Conjunctiva/sclera: Conjunctivae normal.  Cardiovascular:     Rate and Rhythm: Normal rate and regular rhythm.     Heart sounds: No murmur heard. Pulmonary:     Effort: Pulmonary effort is normal. No respiratory distress.     Breath sounds: Normal breath sounds.  Abdominal:     General: Bowel sounds are normal.     Palpations: Abdomen is soft.     Tenderness: There is generalized abdominal tenderness. There is no right CVA tenderness, left CVA tenderness or guarding.  Comments: Abdomen is soft, generalized abdominal pain primarily in the upper quadrants.  No CVA tenderness, no guarding or rigidity or rebound.  Musculoskeletal:     Cervical back: Neck supple.  Skin:    General: Skin is warm and dry.  Neurological:     Mental Status: He is alert.   ED Results / Procedures / Treatments   Labs (all labs ordered are listed, but only abnormal results are displayed) Labs Reviewed  LIPASE, BLOOD - Abnormal; Notable for the following components:      Result Value   Lipase 65 (*)    All other components within normal limits  COMPREHENSIVE METABOLIC PANEL - Abnormal; Notable for the following components:   Sodium 132 (*)    Potassium 5.4 (*)    Glucose, Bld 203 (*)    Creatinine, Ser 1.44 (*)    Calcium 8.8 (*)    Albumin 3.2 (*)    Total Bilirubin 1.3 (*)    All other components within normal limits  CBC - Abnormal; Notable for the following components:   RBC 4.17 (*)    Hemoglobin 11.4 (*)    HCT 36.8 (*)    All other components within normal limits  URINALYSIS, ROUTINE W REFLEX MICROSCOPIC    EKG None  Radiology No results found.  Procedures Procedures   Medications Ordered in ED Medications  amLODipine (NORVASC) tablet 10 mg (has no administration in time range)  carvedilol (COREG) tablet 25 mg (has no administration in time range)  morphine 4 MG/ML injection 4 mg (has  no administration in time range)  ondansetron (ZOFRAN-ODT) disintegrating tablet 8 mg (has no administration in time range)    ED Course  I have reviewed the triage vital signs and the nursing notes.  Pertinent labs & imaging results that were available during my care of the patient were reviewed by me and considered in my medical decision making (see chart for details).  Clinical Course as of 03/28/21 1048  Wed Mar 28, 2021  1046 CBC(!) No leukocytosis which would be concerning for infectious process.  Patient is mildly anemic, appears to be baseline. [HS]  1046 Lipase(!): 65 Elevated lipase, but per chart review this is significantly decreased from prior this month.  I doubt acute pancreatitis [HS]  1047 Creatinine(!): 1.44 Elevated creatinine, this appears to be typical baseline per the patient.  Chart review shows that his creatinine has been elevated for the last few months.  It is actually improved from 1 month ago. [HS]    Clinical Course User Index [HS] Sherrill Raring, PA-C   MDM Rules/Calculators/A&P                           Patient is hypertensive, will give dose of home medicine and watch.  I doubt acute hypertensive emergency given his laboratory work-up or initially in triage does not show significant elevation of creatinine.  Patient himself denies headache, vision changes, blurry vision, chest pain, shortness of breath, lower extremity swelling.  Abdomen is without any peritoneal signs on exam, soft without localized tenderness.   I personally reviewed the patient's frequent medical visits for abdominal pain.  Given that he was seen 6 days ago and had a CT I feel fairly confident he has not having any acute pathology at this time.  I really doubt pancreatitis based on lipase continue to decrease.  We will treat the pain symptomatically and have him follow-up with GI, reiterated that it  is important for the patient himself to follow-up with GI as they will not routinely reach  out to schedule follow-up.  Patient reports improvement in his pain, discharged in stable position.  Discussed HPI, physical exam and plan of care for this patient with attending Ankit Nanavati. The attending physician evaluated this patient as part of a shared visit and agrees with plan of care.  He is aware of the patient's blood pressure and is in agreement the patient is not in hypertensive emergency.  Do not think we need to wait to [facility] wait for the blood pressure numbers to decrease, oral medicine given, discharged stable position    Sherrill Raring, [place] 03/28/21 1108    Varney Biles, MD 03/31/21 2237

## 2021-03-28 NOTE — Discharge Instructions (Signed)
Take the Percocet every 6 hours as needed for pain.  He can take the Zofran which is an antinausea medicine every 8 hours as needed to prevent vomiting. Follow-up with the gastroenterology group.  You personally have to call them to arrange follow-up, if you have not seen them since the pancreatitis episode would be good to get established with a group.

## 2021-03-31 ENCOUNTER — Emergency Department (HOSPITAL_COMMUNITY)
Admission: EM | Admit: 2021-03-31 | Discharge: 2021-04-01 | Disposition: A | Discharge status: Home / Self Care | Payer: Self-pay | Attending: Emergency Medicine | Admitting: Emergency Medicine

## 2021-03-31 DIAGNOSIS — Z5321 Procedure and treatment not carried out due to patient leaving prior to being seen by health care provider: Secondary | ICD-10-CM | POA: Insufficient documentation

## 2021-03-31 DIAGNOSIS — K859 Acute pancreatitis without necrosis or infection, unspecified: Secondary | ICD-10-CM | POA: Insufficient documentation

## 2021-03-31 MED ORDER — OXYCODONE-ACETAMINOPHEN 5-325 MG PO TABS
1.0000 | ORAL_TABLET | Freq: Once | ORAL | Status: AC
  Administered 2021-03-31: 1 via ORAL
  Filled 2021-03-31: qty 1

## 2021-03-31 NOTE — ED Triage Notes (Signed)
Patient arrived with EMS from home reports pancreatitis flare up with generalized abdominal pain and emesis today . He received Zofran 4mg  IV and Fentanyl IV by EMS prior to arrival .

## 2021-03-31 NOTE — ED Provider Notes (Signed)
Emergency Medicine Provider Triage Evaluation Note  Randy [name]. , a 40 y.o. male  was evaluated in triage.  Pt complains of epigastric abdominal pain, nausea and vomiting.  Patient reports this started earlier today.  Patient reports that nausea and vomiting have subsided.  Emesis was described as clear.  Pain feels similar to previous pancreatitis flares.  Patient denies any alcohol use or illicit drug use  Review of Systems  Positive: Abdominal pain, nausea, vomiting Negative: Constipation, diarrhea, melena, blood in stool, coffee-ground emesis, hematemesis, dysuria, urinary frequency, hematuria  Physical Exam  BP (!) 141/91 (BP Location: Left Arm)   Pulse 79   Temp 98.6 F (37 C) (Oral)   Resp 18   SpO2 100%  Gen:   Awake, no distress   Resp:  Normal effort  MSK:   Moves extremities without difficulty  Other:  Abdomen soft, nondistended, tenderness to epigastric area.  No guarding or rebound tenderness.  Medical Decision Making  Medically screening exam initiated at 11:31 PM.  Appropriate orders placed.  Randy [name]. was informed that the remainder of the evaluation will be completed by another provider, this initial triage assessment does not replace that evaluation, and the importance of remaining in the ED until their evaluation is complete.     Haskel Schroeder, PA-C 03/31/21 2333    Charlynne Pander, MD 04/01/21 [phone]

## 2021-04-01 ENCOUNTER — Inpatient Hospital Stay (HOSPITAL_COMMUNITY)
Admission: EM | Admit: 2021-04-01 | Discharge: 2021-04-04 | DRG: 439 | Disposition: A | Discharge status: Home / Self Care | Payer: Self-pay | Attending: Internal Medicine | Admitting: Internal Medicine

## 2021-04-01 DIAGNOSIS — Z79899 Other long term (current) drug therapy: Secondary | ICD-10-CM

## 2021-04-01 DIAGNOSIS — Z20822 Contact with and (suspected) exposure to covid-19: Secondary | ICD-10-CM | POA: Diagnosis present

## 2021-04-01 DIAGNOSIS — N1831 Chronic kidney disease, stage 3a: Secondary | ICD-10-CM | POA: Diagnosis present

## 2021-04-01 DIAGNOSIS — Z87891 Personal history of nicotine dependence: Secondary | ICD-10-CM

## 2021-04-01 DIAGNOSIS — Z91018 Allergy to other foods: Secondary | ICD-10-CM

## 2021-04-01 DIAGNOSIS — E119 Type 2 diabetes mellitus without complications: Secondary | ICD-10-CM

## 2021-04-01 DIAGNOSIS — K859 Acute pancreatitis without necrosis or infection, unspecified: Principal | ICD-10-CM | POA: Diagnosis present

## 2021-04-01 DIAGNOSIS — I160 Hypertensive urgency: Secondary | ICD-10-CM | POA: Diagnosis present

## 2021-04-01 DIAGNOSIS — E1169 Type 2 diabetes mellitus with other specified complication: Secondary | ICD-10-CM | POA: Diagnosis present

## 2021-04-01 DIAGNOSIS — R112 Nausea with vomiting, unspecified: Secondary | ICD-10-CM | POA: Diagnosis present

## 2021-04-01 DIAGNOSIS — E785 Hyperlipidemia, unspecified: Secondary | ICD-10-CM | POA: Diagnosis present

## 2021-04-01 DIAGNOSIS — Z8249 Family history of ischemic heart disease and other diseases of the circulatory system: Secondary | ICD-10-CM

## 2021-04-01 DIAGNOSIS — I129 Hypertensive chronic kidney disease with stage 1 through stage 4 chronic kidney disease, or unspecified chronic kidney disease: Secondary | ICD-10-CM | POA: Diagnosis present

## 2021-04-01 DIAGNOSIS — K219 Gastro-esophageal reflux disease without esophagitis: Secondary | ICD-10-CM | POA: Diagnosis present

## 2021-04-01 DIAGNOSIS — N179 Acute kidney failure, unspecified: Secondary | ICD-10-CM | POA: Diagnosis present

## 2021-04-01 DIAGNOSIS — Z794 Long term (current) use of insulin: Secondary | ICD-10-CM

## 2021-04-01 DIAGNOSIS — Z7984 Long term (current) use of oral hypoglycemic drugs: Secondary | ICD-10-CM

## 2021-04-01 DIAGNOSIS — E860 Dehydration: Secondary | ICD-10-CM | POA: Diagnosis present

## 2021-04-01 DIAGNOSIS — Z833 Family history of diabetes mellitus: Secondary | ICD-10-CM

## 2021-04-01 DIAGNOSIS — E1122 Type 2 diabetes mellitus with diabetic chronic kidney disease: Secondary | ICD-10-CM | POA: Diagnosis present

## 2021-04-01 LAB — COMPREHENSIVE METABOLIC PANEL
ALT: 13 U/L (ref 0–44)
AST: 22 U/L (ref 15–41)
Albumin: 3.1 g/dL — ABNORMAL LOW (ref 3.5–5.0)
Alkaline Phosphatase: 96 U/L (ref 38–126)
Anion gap: 9 (ref 5–15)
BUN: 23 mg/dL — ABNORMAL HIGH (ref 6–20)
CO2: 24 mmol/L (ref 22–32)
Calcium: 8.8 mg/dL — ABNORMAL LOW (ref 8.9–10.3)
Chloride: 102 mmol/L (ref 98–111)
Creatinine, Ser: 1.9 mg/dL — ABNORMAL HIGH (ref 0.61–1.24)
GFR, Estimated: 45 mL/min — ABNORMAL LOW (ref >60)
Glucose, Bld: 153 mg/dL — ABNORMAL HIGH (ref 70–99)
Potassium: 4.6 mmol/L (ref 3.5–5.1)
Sodium: 135 mmol/L (ref 135–145)
Total Bilirubin: 0.9 mg/dL (ref 0.3–1.2)
Total Protein: 6.9 g/dL (ref 6.5–8.1)

## 2021-04-01 LAB — CBC WITH DIFFERENTIAL/PLATELET
Abs Immature Granulocytes: 0.04 10*3/uL (ref 0.00–0.07)
Basophils Absolute: 0.1 10*3/uL (ref 0.0–0.1)
Basophils Relative: 1 %
Eosinophils Absolute: 0.1 10*3/uL (ref 0.0–0.5)
Eosinophils Relative: 1 %
HCT: 32.3 % — ABNORMAL LOW (ref 39.0–52.0)
Hemoglobin: 10.6 g/dL — ABNORMAL LOW (ref 13.0–17.0)
Immature Granulocytes: 1 %
Lymphocytes Relative: 23 %
Lymphs Abs: 2 10*3/uL (ref 0.7–4.0)
MCH: 27.7 pg (ref 26.0–34.0)
MCHC: 32.8 g/dL (ref 30.0–36.0)
MCV: 84.6 fL (ref 80.0–100.0)
Monocytes Absolute: 0.5 10*3/uL (ref 0.1–1.0)
Monocytes Relative: 6 %
Neutro Abs: 5.8 10*3/uL (ref 1.7–7.7)
Neutrophils Relative %: 68 %
Platelets: 283 10*3/uL (ref 150–400)
RBC: 3.82 MIL/uL — ABNORMAL LOW (ref 4.22–5.81)
RDW: 12.5 % (ref 11.5–15.5)
WBC: 8.5 10*3/uL (ref 4.0–10.5)
nRBC: 0 % (ref 0.0–0.2)

## 2021-04-01 LAB — URINALYSIS, ROUTINE W REFLEX MICROSCOPIC
Bilirubin Urine: NEGATIVE
Glucose, UA: NEGATIVE mg/dL
Ketones, ur: 5 mg/dL — AB
Leukocytes,Ua: NEGATIVE
Nitrite: NEGATIVE
Protein, ur: >=300 mg/dL — AB
Specific Gravity, Urine: 1.024 (ref 1.005–1.030)
pH: 5 (ref 5.0–8.0)

## 2021-04-01 LAB — LIPASE, BLOOD: Lipase: 306 U/L — ABNORMAL HIGH (ref 11–51)

## 2021-04-01 MED ORDER — SODIUM CHLORIDE 0.9 % IV BOLUS
1000.0000 mL | Freq: Once | INTRAVENOUS | Status: AC
  Administered 2021-04-01: 1000 mL via INTRAVENOUS

## 2021-04-01 MED ORDER — MORPHINE SULFATE (PF) 4 MG/ML IV SOLN
4.0000 mg | Freq: Once | INTRAVENOUS | Status: AC
  Administered 2021-04-01: 4 mg via INTRAVENOUS
  Filled 2021-04-01: qty 1

## 2021-04-01 MED ORDER — ONDANSETRON HCL 4 MG/2ML IJ SOLN
4.0000 mg | Freq: Once | INTRAMUSCULAR | Status: AC
  Administered 2021-04-01: 4 mg via INTRAVENOUS
  Filled 2021-04-01: qty 2

## 2021-04-01 NOTE — ED Provider Notes (Signed)
23:30: Assumed care of patient from PA [place] @ change of shift pending work-up and disposition.   Please see prior provider note for full H&P.  Briefly patient is a 40 yo male who presented to the ED with complaints of abdominal pain w/ N/V x 2 days, hx of pancreatitis & this feels similar. Does not currently drink EtOH.   Labs & CT imaging ordered by prior provider.  Labs reviewed- Lipase elevated- improved from yesterday, AKI with creatinie 2.46 today, typically 1.4 and was 1.9 two days prior.  CT-  Changes suggestive of minimal pancreatitis in the region of the pancreatic head.   Given patient's pancreatitis w/ AKI and continued pain following analgesics will consult hospitalist service for admission at this time. Additional analgesics & fluids ordered. Patient updated on results & plan of care & is in agreement.   03:35: CONSULT: Discussed with hospitalist Dr. Loney Loh- accepts admission.    Results for orders placed or performed during the hospital encounter of 04/01/21  Resp Panel by RT-PCR (Flu A&B, Covid) Nasopharyngeal Swab   Specimen: Nasopharyngeal Swab; Nasopharyngeal(NP) swabs in vial transport medium  Result Value Ref Range   SARS Coronavirus 2 by RT PCR NEGATIVE NEGATIVE   Influenza A by PCR NEGATIVE NEGATIVE   Influenza B by PCR NEGATIVE NEGATIVE  Comprehensive metabolic panel  Result Value Ref Range   Sodium 133 (L) 135 - 145 mmol/L   Potassium 4.2 3.5 - 5.1 mmol/L   Chloride 102 98 - 111 mmol/L   CO2 23 22 - 32 mmol/L   Glucose, Bld 167 (H) 70 - 99 mg/dL   BUN 28 (H) 6 - 20 mg/dL   Creatinine, Ser 5.53 (H) 0.61 - 1.24 mg/dL   Calcium 8.5 (L) 8.9 - 10.3 mg/dL   Total Protein 6.8 6.5 - 8.1 g/dL   Albumin 3.2 (L) 3.5 - 5.0 g/dL   AST 21 15 - 41 U/L   ALT 17 0 - 44 U/L   Alkaline Phosphatase 94 38 - 126 U/L   Total Bilirubin 0.6 0.3 - 1.2 mg/dL   GFR, Estimated 33 (L) >60 mL/min   Anion gap 8 5 - 15  Lipase, blood  Result Value Ref Range   Lipase 248 (H) 11 -  51 U/L  CBC with Differential/Platelet  Result Value Ref Range   WBC 8.5 4.0 - 10.5 K/uL   RBC 3.73 (L) 4.22 - 5.81 MIL/uL   Hemoglobin 10.2 (L) 13.0 - 17.0 g/dL   HCT 74.8 (L) 27.0 - 78.6 %   MCV 83.9 80.0 - 100.0 fL   MCH 27.3 26.0 - 34.0 pg   MCHC 32.6 30.0 - 36.0 g/dL   RDW 75.4 49.2 - 01.0 %   Platelets 265 150 - 400 K/uL   nRBC 0.0 0.0 - 0.2 %   Neutrophils Relative % 73 %   Neutro Abs 6.2 1.7 - 7.7 K/uL   Lymphocytes Relative 20 %   Lymphs Abs 1.7 0.7 - 4.0 K/uL   Monocytes Relative 5 %   Monocytes Absolute 0.4 0.1 - 1.0 K/uL   Eosinophils Relative 1 %   Eosinophils Absolute 0.0 0.0 - 0.5 K/uL   Basophils Relative 1 %   Basophils Absolute 0.1 0.0 - 0.1 K/uL   Immature Granulocytes 0 %   Abs Immature Granulocytes 0.03 0.00 - 0.07 K/uL  POC CBG, ED  Result Value Ref Range   Glucose-Capillary 159 (H) 70 - 99 mg/dL   CT ABDOMEN PELVIS W CONTRAST  Result  Date: 04/02/2021 CLINICAL DATA:  Nausea and vomiting with epigastric pain and history of pancreatitis EXAM: CT ABDOMEN AND PELVIS WITH CONTRAST TECHNIQUE: Multidetector CT imaging of the abdomen and pelvis was performed using the standard protocol following bolus administration of intravenous contrast. CONTRAST:  77mL OMNIPAQUE IOHEXOL 300 MG/ML  SOLN COMPARISON:  03/22/2021 FINDINGS: Lower chest: No acute abnormality. Hepatobiliary: No focal liver abnormality is seen. No gallstones, gallbladder wall thickening, or biliary dilatation. Pancreas: Pancreas is well visualized. Some minimal peripancreatic inflammatory changes are noted adjacent to the head of the pancreas which may represent some very early mild pancreatitis. Spleen: Normal in size without focal abnormality. Adrenals/Urinary Tract: Adrenal glands are within normal limits. Kidneys are well visualize without renal calculi or obstructive changes. Ureters are within limits. Bladder is partially distended. Stomach/Bowel: No obstructive or inflammatory changes of colon noted.  The appendix is within normal limits. No inflammatory changes are seen. Small bowel and stomach are within normal limits. Vascular/Lymphatic: Aortic atherosclerosis. No enlarged abdominal or pelvic lymph nodes. Reproductive: Prostate is unremarkable. Other: No abdominal wall hernia or abnormality. No abdominopelvic ascites. Musculoskeletal: No acute or significant osseous findings. IMPRESSION: Changes suggestive of minimal pancreatitis in the region of the pancreatic head. Correlate with laboratory values. No other focal abnormality is noted. Electronically Signed   By: Alcide Clever M.D.   On: 04/02/2021 01:29   CT Abdomen Pelvis W Contrast  Result Date: 03/22/2021 CLINICAL DATA:  Nausea and vomiting.  Epigastric pain EXAM: CT ABDOMEN AND PELVIS WITH CONTRAST TECHNIQUE: Multidetector CT imaging of the abdomen and pelvis was performed using the standard protocol following bolus administration of intravenous contrast. CONTRAST:  OMNIPAQUE IOHEXOL 300 MG/ML  SOLN COMPARISON:  CT abdomen pelvis 09/17/2020 FINDINGS: Lower chest: No acute abnormality. Hepatobiliary: No focal liver abnormality. No gallstones, gallbladder wall thickening, or pericholecystic fluid. No biliary dilatation. Pancreas: No focal lesion. Normal pancreatic contour. No surrounding inflammatory changes. No main pancreatic ductal dilatation. Spleen: Normal in size without focal abnormality. Adrenals/Urinary Tract: No adrenal nodule bilaterally. Bilateral kidneys enhance symmetrically. No hydronephrosis. No hydroureter. The urinary bladder is unremarkable. Stomach/Bowel: Stomach is within normal limits. No evidence of bowel wall thickening or dilatation. Appendix appears normal. Vascular/Lymphatic: No abdominal aorta or iliac aneurysm. Mild atherosclerotic plaque of the aorta and its branches. No abdominal, pelvic, or inguinal lymphadenopathy. Reproductive: Prostate is unremarkable. Other: No intraperitoneal free fluid. No intraperitoneal  free gas. No organized fluid collection. Musculoskeletal: No abdominal wall hernia or abnormality. No suspicious lytic or blastic osseous lesions. No acute displaced fracture. Multilevel degenerative changes of the spine. IMPRESSION: 1. No acute intra-abdominal or intrapelvic abnormality. 2.  Aortic Atherosclerosis (ICD10-I70.0). Electronically Signed   By: Tish Frederickson M.D.   On: 03/22/2021 21:32          Cherly Anderson, PA-C 04/02/21 1610    Nira Conn, MD 04/02/21 [phone]

## 2021-04-01 NOTE — ED Notes (Signed)
IV removed pt leaving

## 2021-04-01 NOTE — ED Triage Notes (Addendum)
Pt BIB EMS for abdominal pain with N?V x2 days. Pt has history of pancreatitis and states "it feels like when I had pancreatitis before. Given 4mg  Zofran and 100 fentanyl PTA which gave pt relief   180/100  HR 104  201 CBG

## 2021-04-01 NOTE — ED Provider Notes (Signed)
[facility] EMERGENCY DEPARTMENT Provider Note   CSN: [id number] Arrival date & time: 04/01/21  2245     History Chief Complaint  Patient presents with   Abdominal Pain    Randy [name]. is a 40 y.o. male presenting for evaluation nausea, vomiting, abdominal pain.  Patient sates the past 2 days he has had epigastric abdominal pain.  Radiates to his back.  He has associated nausea and vomiting.  He was in the ER yesterday, but left before being seen due to wait times.  He reports symptoms worsened about an hour prior to arrival.  He tried ibuprofen at home without improvement of symptoms.  He denies fevers or chills.  No chest pain, shortness of breath, lower abdominal pain, urinary symptoms, normal bowel movements.  He has a history of pancreatitis, states this feels similar.  HPI     Past Medical History:  Diagnosis Date   Essential hypertension 05/31/2019   HLD (hyperlipidemia)    Hypertension    Pancreatitis    Type II diabetes mellitus ([place])     Patient Active Problem List   Diagnosis Date Noted   Acute pancreatitis 09/17/2020   Abnormal urinalysis 09/17/2020   Intractable nausea and vomiting 09/17/2020   Hypertensive crisis 09/12/2020   Nausea & vomiting 09/12/2020   AKI (acute kidney injury) ([place]) 09/12/2020   GERD (gastroesophageal reflux disease) 09/12/2020   Type 2 diabetes mellitus without complication, with long-term current use of insulin ([place])    Hyperlipidemia 06/01/2019   Microalbuminuria due to type 2 diabetes mellitus ([place]) 06/01/2019   Essential hypertension 05/31/2019   Hypertensive urgency 08/31/2015   Type 2 diabetes mellitus with hyperglycemia, with long-term current use of insulin ([place]) 08/31/2015   Marijuana abuse 08/31/2015    Past Surgical History:  Procedure Laterality Date   NO PAST SURGERIES         Family History  Problem Relation Age of Onset   Hypertension Mother    Diabetes Father    Hypertension  Father    Diabetes Brother    Diabetes Maternal Grandmother    Colon cancer Neg Hx    Esophageal cancer Neg Hx    Rectal cancer Neg Hx    Stomach cancer Neg Hx     Social History   Tobacco Use   Smoking status: Former    Packs/day: 0.33    Years: 17.00    Pack years: 5.61    Types: Cigarettes    Quit date: 12/26/2011    Years since quitting: 9.2   Smokeless tobacco: Never  Vaping Use   Vaping Use: Never used  Substance Use Topics   Alcohol use: No    Comment: none in6-7 years   Drug use: Yes    Types: Marijuana    Comment: Nov 2020    Home Medications Prior to Admission medications   Medication Sig Start Date End Date Taking? Authorizing Provider  amLODipine (NORVASC) 10 MG tablet Take 1 tablet (10 mg total) by mouth daily. 01/12/21 02/18/21  Dorna Mai, MD  atorvastatin (LIPITOR) 80 MG tablet Take 1 tablet (80 mg total) by mouth daily. 11/02/20 01/31/21  Camillia Herter, NP  Blood Glucose Monitoring Suppl (TRUE METRIX METER) w/Device KIT Use as directed 10/30/20   Camillia Herter, NP  carvedilol (COREG) 25 MG tablet Take 1 tablet (25 mg total) by mouth 2 (two) times daily with a meal. 01/12/21 02/18/21  Dorna Mai, MD  cephALEXin (KEFLEX) 500 MG capsule Take 1  capsule (500 mg total) by mouth 4 (four) times daily. 09/14/20   Daleen Bo, MD  glipiZIDE (GLUCOTROL) 10 MG tablet Take 1 tablet (10 mg total) by mouth 2 (two) times daily before a meal. 01/12/21 02/18/21  Dorna Mai, MD  glucose blood (TRUE METRIX BLOOD GLUCOSE TEST) test strip Use as instructed 10/30/20   Camillia Herter, NP  HYDROcodone-acetaminophen (NORCO/VICODIN) 5-325 MG tablet Take 1 tablet by mouth every 6 (six) hours as needed. 03/28/21   Sherrill Raring, PA-C  insulin glargine (LANTUS) 100 UNIT/ML Solostar Pen Inject 30 Units into the skin daily. 10/30/20 12/09/20  Camillia Herter, NP  Insulin Pen Needle (PEN NEEDLES) 31G X 8 MM MISC USE AS DIRECTED 10/30/20   Camillia Herter, NP  lisinopril (ZESTRIL) 40 MG  tablet Take 1 tablet (40 mg total) by mouth daily. 01/12/21 02/18/21  Dorna Mai, MD  omeprazole (PRILOSEC) 40 MG capsule Take 1 capsule (40 mg total) by mouth daily. 09/13/20   Geradine Girt, DO  ondansetron (ZOFRAN ODT) 4 MG disintegrating tablet Take 1 tablet (4 mg total) by mouth every 8 (eight) hours as needed for nausea or vomiting. 03/28/21   Sherrill Raring, PA-C  sildenafil (VIAGRA) 100 MG tablet TAKE 0.5-1 TABLETS (50-100 MG TOTAL) BY MOUTH DAILY AS NEEDED FOR ERECTILE DYSFUNCTION. 01/12/21 01/12/22  Dorna Mai, MD  TRUEplus Lancets 28G MISC Use as directed 10/30/20   Camillia Herter, NP    Allergies    Tomato  Review of Systems   Review of Systems  Constitutional:  Negative for fever.  Gastrointestinal:  Positive for abdominal pain, nausea and vomiting.   Physical Exam Updated Vital Signs BP (!) 176/156 (BP Location: Right Arm)   Pulse 81   Temp 98.4 F (36.9 C) (Oral)   Resp 18   SpO2 100%   Physical Exam Vitals and nursing note reviewed.  Constitutional:      General: He is not in acute distress.    Appearance: Normal appearance.     Comments: In the bed in NAD  HENT:     Head: Normocephalic and atraumatic.  Eyes:     Conjunctiva/sclera: Conjunctivae normal.     Pupils: Pupils are equal, round, and reactive to light.  Cardiovascular:     Rate and Rhythm: Normal rate and regular rhythm.     Pulses: Normal pulses.  Pulmonary:     Effort: Pulmonary effort is normal. No respiratory distress.     Breath sounds: Normal breath sounds. No wheezing.     Comments: Speaking in full sentences.  Clear lung sounds in all fields. Abdominal:     General: There is no distension.     Palpations: Abdomen is soft. There is no mass.     Tenderness: There is generalized abdominal tenderness and tenderness in the epigastric area. There is no guarding or rebound.     Comments: Diffuse tenderness to palpation the abdomen, worse in the epigastric region.  No rigidity, guarding,  distention.  Negative rebound.  No peritonitis.  Musculoskeletal:        General: Normal range of motion.     Cervical back: Normal range of motion and neck supple.  Skin:    General: Skin is warm and dry.     Capillary Refill: Capillary refill takes less than 2 seconds.  Neurological:     Mental Status: He is alert and oriented to person, place, and time.  Psychiatric:        Mood and Affect: Mood  and affect normal.        Speech: Speech normal.        Behavior: Behavior normal.    ED Results / Procedures / Treatments   Labs (all labs ordered are listed, but only abnormal results are displayed) Labs Reviewed  RESP PANEL BY RT-PCR (FLU A&B, COVID) ARPGX2  CBC WITH DIFFERENTIAL/PLATELET  COMPREHENSIVE METABOLIC PANEL  LIPASE, BLOOD  CBG MONITORING, ED    EKG None  Radiology No results found.  Procedures Procedures   Medications Ordered in ED Medications  sodium chloride 0.9 % bolus 1,000 mL (has no administration in time range)  ondansetron (ZOFRAN) injection 4 mg (has no administration in time range)  morphine 4 MG/ML injection 4 mg (has no administration in time range)    ED Course  I have reviewed the triage vital signs and the nursing notes.  Pertinent labs & imaging results that were available during my care of the patient were reviewed by me and considered in my medical decision making (see chart for details).    MDM Rules/Calculators/A&P                           Presented for evaluation of abdominal pain, nausea, vomiting.  On exam, patient peers nontoxic.  He does have diffuse tenderness palpation the abdomen, worse in epigastric region.  Patient states this feels consistent with his previous pancreatitis episodes.  Labs obtained from last night reviewed by me, does show elevated lipase.  Will recheck today in the setting of worsening symptoms.  Will obtain CT for further evaluation.  We will treat symptomatically, patient will likely need to be admitted  due to his worsening pain today.  Pt signed out to [employer], PA-C for f/u on labs and imaging with likely admit.   Final Clinical Impression(s) / ED Diagnoses Final diagnoses:  None    Rx / DC Orders ED Discharge Orders     None        Franchot Heidelberg, PA-C 04/01/21 2343    Fatima Blank, MD 04/02/21 [phone]

## 2021-04-02 ENCOUNTER — Encounter (HOSPITAL_COMMUNITY): Payer: Self-pay

## 2021-04-02 ENCOUNTER — Emergency Department (HOSPITAL_COMMUNITY): Payer: Self-pay

## 2021-04-02 ENCOUNTER — Other Ambulatory Visit: Payer: Self-pay

## 2021-04-02 DIAGNOSIS — K859 Acute pancreatitis without necrosis or infection, unspecified: Principal | ICD-10-CM

## 2021-04-02 DIAGNOSIS — K8500 Idiopathic acute pancreatitis without necrosis or infection: Secondary | ICD-10-CM

## 2021-04-02 LAB — RAPID URINE DRUG SCREEN, HOSP PERFORMED
Amphetamines: NOT DETECTED
Barbiturates: NOT DETECTED
Benzodiazepines: NOT DETECTED
Cocaine: NOT DETECTED
Opiates: POSITIVE — AB
Tetrahydrocannabinol: POSITIVE — AB

## 2021-04-02 LAB — CBC WITH DIFFERENTIAL/PLATELET
Abs Immature Granulocytes: 0.03 10*3/uL (ref 0.00–0.07)
Basophils Absolute: 0.1 10*3/uL (ref 0.0–0.1)
Basophils Relative: 1 %
Eosinophils Absolute: 0 10*3/uL (ref 0.0–0.5)
Eosinophils Relative: 1 %
HCT: 31.3 % — ABNORMAL LOW (ref 39.0–52.0)
Hemoglobin: 10.2 g/dL — ABNORMAL LOW (ref 13.0–17.0)
Immature Granulocytes: 0 %
Lymphocytes Relative: 20 %
Lymphs Abs: 1.7 10*3/uL (ref 0.7–4.0)
MCH: 27.3 pg (ref 26.0–34.0)
MCHC: 32.6 g/dL (ref 30.0–36.0)
MCV: 83.9 fL (ref 80.0–100.0)
Monocytes Absolute: 0.4 10*3/uL (ref 0.1–1.0)
Monocytes Relative: 5 %
Neutro Abs: 6.2 10*3/uL (ref 1.7–7.7)
Neutrophils Relative %: 73 %
Platelets: 265 10*3/uL (ref 150–400)
RBC: 3.73 MIL/uL — ABNORMAL LOW (ref 4.22–5.81)
RDW: 12.3 % (ref 11.5–15.5)
WBC: 8.5 10*3/uL (ref 4.0–10.5)
nRBC: 0 % (ref 0.0–0.2)

## 2021-04-02 LAB — COMPREHENSIVE METABOLIC PANEL
ALT: 17 U/L (ref 0–44)
AST: 21 U/L (ref 15–41)
Albumin: 3.2 g/dL — ABNORMAL LOW (ref 3.5–5.0)
Alkaline Phosphatase: 94 U/L (ref 38–126)
Anion gap: 8 (ref 5–15)
BUN: 28 mg/dL — ABNORMAL HIGH (ref 6–20)
CO2: 23 mmol/L (ref 22–32)
Calcium: 8.5 mg/dL — ABNORMAL LOW (ref 8.9–10.3)
Chloride: 102 mmol/L (ref 98–111)
Creatinine, Ser: 2.46 mg/dL — ABNORMAL HIGH (ref 0.61–1.24)
GFR, Estimated: 33 mL/min — ABNORMAL LOW (ref >60)
Glucose, Bld: 167 mg/dL — ABNORMAL HIGH (ref 70–99)
Potassium: 4.2 mmol/L (ref 3.5–5.1)
Sodium: 133 mmol/L — ABNORMAL LOW (ref 135–145)
Total Bilirubin: 0.6 mg/dL (ref 0.3–1.2)
Total Protein: 6.8 g/dL (ref 6.5–8.1)

## 2021-04-02 LAB — CBG MONITORING, ED
Glucose-Capillary: 129 mg/dL — ABNORMAL HIGH (ref 70–99)
Glucose-Capillary: 138 mg/dL — ABNORMAL HIGH (ref 70–99)
Glucose-Capillary: 140 mg/dL — ABNORMAL HIGH (ref 70–99)
Glucose-Capillary: 159 mg/dL — ABNORMAL HIGH (ref 70–99)

## 2021-04-02 LAB — BASIC METABOLIC PANEL
Anion gap: 10 (ref 5–15)
BUN: 19 mg/dL (ref 6–20)
CO2: 24 mmol/L (ref 22–32)
Calcium: 8.8 mg/dL — ABNORMAL LOW (ref 8.9–10.3)
Chloride: 101 mmol/L (ref 98–111)
Creatinine, Ser: 1.89 mg/dL — ABNORMAL HIGH (ref 0.61–1.24)
GFR, Estimated: 45 mL/min — ABNORMAL LOW (ref >60)
Glucose, Bld: 149 mg/dL — ABNORMAL HIGH (ref 70–99)
Potassium: 4.7 mmol/L (ref 3.5–5.1)
Sodium: 135 mmol/L (ref 135–145)

## 2021-04-02 LAB — LIPASE, BLOOD
Lipase: 106 U/L — ABNORMAL HIGH (ref 11–51)
Lipase: 248 U/L — ABNORMAL HIGH (ref 11–51)

## 2021-04-02 LAB — HEMOGLOBIN A1C
Hgb A1c MFr Bld: 7.7 % — ABNORMAL HIGH (ref 4.8–5.6)
Mean Plasma Glucose: 174.29 mg/dL

## 2021-04-02 LAB — RESP PANEL BY RT-PCR (FLU A&B, COVID) ARPGX2
Influenza A by PCR: NEGATIVE
Influenza B by PCR: NEGATIVE
SARS Coronavirus 2 by RT PCR: NEGATIVE

## 2021-04-02 LAB — URINALYSIS, ROUTINE W REFLEX MICROSCOPIC
Bacteria, UA: NONE SEEN
Bilirubin Urine: NEGATIVE
Glucose, UA: 50 mg/dL — AB
Ketones, ur: 5 mg/dL — AB
Leukocytes,Ua: NEGATIVE
Nitrite: NEGATIVE
Protein, ur: 100 mg/dL — AB
Specific Gravity, Urine: 1.012 (ref 1.005–1.030)
pH: 7 (ref 5.0–8.0)

## 2021-04-02 LAB — LIPID PANEL
Cholesterol: 310 mg/dL — ABNORMAL HIGH (ref 0–200)
HDL: 32 mg/dL — ABNORMAL LOW (ref >40)
LDL Cholesterol: 246 mg/dL — ABNORMAL HIGH (ref 0–99)
Total CHOL/HDL Ratio: 9.7 RATIO
Triglycerides: 159 mg/dL — ABNORMAL HIGH (ref <150)
VLDL: 32 mg/dL (ref 0–40)

## 2021-04-02 LAB — GLUCOSE, CAPILLARY
Glucose-Capillary: 170 mg/dL — ABNORMAL HIGH (ref 70–99)
Glucose-Capillary: 142 mg/dL — ABNORMAL HIGH (ref 70–99)

## 2021-04-02 LAB — CREATININE, URINE, RANDOM: Creatinine, Urine: 35.49 mg/dL

## 2021-04-02 LAB — OSMOLALITY, URINE: Osmolality, Ur: 376 mOsm/kg (ref 300–900)

## 2021-04-02 LAB — SODIUM, URINE, RANDOM: Sodium, Ur: 136 mmol/L

## 2021-04-02 MED ORDER — ACETAMINOPHEN 650 MG RE SUPP: 650.0000 mg | Freq: Four times a day (QID) | RECTAL | Status: DC | PRN

## 2021-04-02 MED ORDER — OXYCODONE-ACETAMINOPHEN 7.5-325 MG PO TABS
1.0000 | ORAL_TABLET | ORAL | Status: DC | PRN
  Filled 2021-04-02: qty 1

## 2021-04-02 MED ORDER — SODIUM CHLORIDE 0.9 % IV SOLN: INTRAVENOUS | Status: AC

## 2021-04-02 MED ORDER — ONDANSETRON HCL 4 MG/2ML IJ SOLN
4.0000 mg | Freq: Four times a day (QID) | INTRAMUSCULAR | Status: DC | PRN
  Administered 2021-04-02 – 2021-04-03 (×5): 4 mg via INTRAVENOUS
  Filled 2021-04-02 (×6): qty 2

## 2021-04-02 MED ORDER — CARVEDILOL 6.25 MG PO TABS
6.2500 mg | ORAL_TABLET | Freq: Two times a day (BID) | ORAL | Status: DC
  Administered 2021-04-02: 6.25 mg via ORAL
  Filled 2021-04-02: qty 1

## 2021-04-02 MED ORDER — HYDROMORPHONE HCL 1 MG/ML IJ SOLN
1.0000 mg | INTRAMUSCULAR | Status: DC | PRN
  Administered 2021-04-02 – 2021-04-04 (×8): 1 mg via INTRAVENOUS
  Filled 2021-04-02 (×9): qty 1

## 2021-04-02 MED ORDER — HYDRALAZINE HCL 20 MG/ML IJ SOLN
10.0000 mg | Freq: Four times a day (QID) | INTRAMUSCULAR | Status: DC | PRN
  Administered 2021-04-02 – 2021-04-04 (×3): 10 mg via INTRAVENOUS
  Filled 2021-04-02 (×4): qty 1

## 2021-04-02 MED ORDER — CARVEDILOL 3.125 MG PO TABS
3.1250 mg | ORAL_TABLET | Freq: Two times a day (BID) | ORAL | Status: DC
  Administered 2021-04-02: 3.125 mg via ORAL
  Filled 2021-04-02: qty 1

## 2021-04-02 MED ORDER — MORPHINE SULFATE (PF) 2 MG/ML IV SOLN
1.0000 mg | INTRAVENOUS | Status: DC | PRN
  Administered 2021-04-02: 1 mg via INTRAVENOUS
  Filled 2021-04-02: qty 1

## 2021-04-02 MED ORDER — CARVEDILOL 3.125 MG PO TABS
3.1250 mg | ORAL_TABLET | Freq: Once | ORAL | Status: AC
  Administered 2021-04-02: 3.125 mg via ORAL
  Filled 2021-04-02: qty 1

## 2021-04-02 MED ORDER — ACETAMINOPHEN 325 MG PO TABS: 650.0000 mg | ORAL_TABLET | Freq: Four times a day (QID) | ORAL | Status: DC | PRN

## 2021-04-02 MED ORDER — AMLODIPINE BESYLATE 10 MG PO TABS
10.0000 mg | ORAL_TABLET | Freq: Every day | ORAL | Status: DC
  Administered 2021-04-02 – 2021-04-04 (×3): 10 mg via ORAL
  Filled 2021-04-02 (×2): qty 1
  Filled 2021-04-02 (×2): qty 2

## 2021-04-02 MED ORDER — INSULIN ASPART 100 UNIT/ML IJ SOLN
0.0000 [IU] | Freq: Three times a day (TID) | INTRAMUSCULAR | Status: DC
  Administered 2021-04-02: 1 [IU] via SUBCUTANEOUS
  Administered 2021-04-02: 2 [IU] via SUBCUTANEOUS
  Administered 2021-04-02: 1 [IU] via SUBCUTANEOUS
  Administered 2021-04-03: 3 [IU] via SUBCUTANEOUS
  Administered 2021-04-03: 2 [IU] via SUBCUTANEOUS
  Administered 2021-04-03: 1 [IU] via SUBCUTANEOUS

## 2021-04-02 MED ORDER — ENOXAPARIN SODIUM 40 MG/0.4ML IJ SOSY
40.0000 mg | PREFILLED_SYRINGE | INTRAMUSCULAR | Status: DC
  Administered 2021-04-02 – 2021-04-04 (×3): 40 mg via SUBCUTANEOUS
  Filled 2021-04-02 (×4): qty 0.4

## 2021-04-02 MED ORDER — HYDRALAZINE HCL 20 MG/ML IJ SOLN
10.0000 mg | Freq: Once | INTRAMUSCULAR | Status: AC
  Administered 2021-04-02: 10 mg via INTRAVENOUS

## 2021-04-02 MED ORDER — LACTATED RINGERS IV BOLUS
500.0000 mL | Freq: Once | INTRAVENOUS | Status: AC
  Administered 2021-04-02: 500 mL via INTRAVENOUS

## 2021-04-02 MED ORDER — IOHEXOL 300 MG/ML  SOLN
75.0000 mL | Freq: Once | INTRAMUSCULAR | Status: AC | PRN
  Administered 2021-04-02: 75 mL via INTRAVENOUS

## 2021-04-02 MED ORDER — HYDROMORPHONE HCL 1 MG/ML IJ SOLN
1.0000 mg | Freq: Once | INTRAMUSCULAR | Status: AC
  Administered 2021-04-02: 1 mg via INTRAVENOUS
  Filled 2021-04-02: qty 1

## 2021-04-02 MED ORDER — PANTOPRAZOLE SODIUM 40 MG PO TBEC
40.0000 mg | DELAYED_RELEASE_TABLET | Freq: Every day | ORAL | Status: DC
  Administered 2021-04-02 – 2021-04-04 (×3): 40 mg via ORAL
  Filled 2021-04-02 (×3): qty 1

## 2021-04-02 MED ORDER — DIPHENHYDRAMINE HCL 25 MG PO CAPS
25.0000 mg | ORAL_CAPSULE | Freq: Three times a day (TID) | ORAL | Status: DC | PRN
  Administered 2021-04-03 – 2021-04-04 (×2): 25 mg via ORAL
  Filled 2021-04-02 (×3): qty 1

## 2021-04-02 MED ORDER — HYDRALAZINE HCL 20 MG/ML IJ SOLN
5.0000 mg | INTRAMUSCULAR | Status: DC | PRN
  Administered 2021-04-02: 5 mg via INTRAVENOUS
  Filled 2021-04-02: qty 1

## 2021-04-02 MED ORDER — SODIUM CHLORIDE 0.9 % IV SOLN: INTRAVENOUS | Status: DC

## 2021-04-02 MED ORDER — INSULIN ASPART 100 UNIT/ML IJ SOLN
0.0000 [IU] | Freq: Every day | INTRAMUSCULAR | Status: DC
  Administered 2021-04-03: 3 [IU] via SUBCUTANEOUS

## 2021-04-02 MED ORDER — LACTATED RINGERS IV BOLUS
1000.0000 mL | Freq: Once | INTRAVENOUS | Status: AC
  Administered 2021-04-02: 1000 mL via INTRAVENOUS

## 2021-04-02 NOTE — Progress Notes (Signed)
Received patient from ED via stretcher, family at bedside.  Patient assisted to bed in position of comfort, kept warm.  Oriented to room and unit routine, call bell within reach.  Needs addressed.

## 2021-04-02 NOTE — Progress Notes (Signed)
PROGRESS NOTE                                                                                                                                                                                                             Patient Demographics:    Randy [name], is a 40 y.o. male, DOB - [birth year], IRW:[id number]  Outpatient Primary MD for the patient is Rema Fendt, NP    LOS - 0  Admit date - 04/01/2021    Chief Complaint  Patient presents with   Abdominal Pain       Brief Narrative (HPI from H&P)   Randy Gibeault. is a 41 y.o. male with medical history significant of hypertension, hyperlipidemia, type 2 diabetes, GERD, marijuana abuse, admitted for pancreatitis in November 2021 and again in May 2022 (he left AMA during this hospitalization).  He now presents with reoccurrence of epigastric abdominal pain which is sharp in nature radiating to his back, CT suggestive of acute pancreatitis, he was admitted for further treatment.   Subjective:    Randy [name] today has, No headache, No chest pain, ++ve epigastric abdominal pain >> to his back - No Nausea, No new weakness tingling or numbness, no SOB.   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  No history of alcohol abuse, stable LFTs and triglyceride levels, is on statin which is on hold, lipase is high and CT is suggestive of acute pancreatitis.  For now supportive care with bowel rest, IV fluids, GI to opine.  2.  AKI due to #1 above.  FeNA is still over 1, hydrate and monitor.  CT scan does not show any ureteric obstruction or stones.  3.  Essential hypertension.  Blood pressure medications adjusted on 04/02/2021 for better control.  4.  Dyslipidemia.  Holding statin due to pancreatitis.  5.  DM type II.  For now sliding scale insulin and monitor.  Lab Results  Component Value Date   HGBA1C 7.7 (H) 04/02/2021     CBG (last 3)  Recent Labs    04/02/21 0009 04/02/21 0827  GLUCAP 159* 140*         Condition - Fair  Family Communication  :  None present  Code Status :  Full  Consults  :  GI  PUD Prophylaxis : PPI   Procedures  :      CT abdomen and pelvis.  Mild pancreatic inflammation suggestive of acute pancreatitis.      Disposition Plan  :    Status is: Observation    DVT Prophylaxis  :    enoxaparin (LOVENOX) injection 40 mg Start: 04/02/21 1000    Lab Results  Component Value Date   PLT 265 04/02/2021    Diet :  Diet Order             Diet clear liquid Room service appropriate? Yes; Fluid consistency: Thin  Diet effective now                    Inpatient Medications  Scheduled Meds:  amLODipine  10 mg Oral Daily   carvedilol  3.125 mg Oral BID WC   enoxaparin (LOVENOX) injection  40 mg Subcutaneous Q24H   insulin aspart  0-5 Units Subcutaneous QHS   insulin aspart  0-9 Units Subcutaneous TID WC   Continuous Infusions:  sodium chloride 125 mL/hr at 04/02/21 0902   PRN Meds:.acetaminophen **OR** acetaminophen, hydrALAZINE, HYDROmorphone (DILAUDID) injection, ondansetron (ZOFRAN) IV, oxyCODONE-acetaminophen  Antibiotics  :    Anti-infectives (From admission, onward)    None        Time Spent in minutes  30   Susa Raring M.D on 04/02/2021 at 10:36 AM  To page go to [email or web address]   [facility] -  Office  [phone]  See all Orders from today for further details    Objective:   Vitals:   04/02/21 0829 04/02/21 0830 04/02/21 1015 04/02/21 1023  BP: (!) 186/109 (!) 183/100 (!) 184/97   Pulse: (!) 102 (!) 105 (!) 106 (!) 102  Resp: 16 18  18   Temp:  98.5 F (36.9 C)    TempSrc:  Oral    SpO2: 100% 100% 95% 98%    Wt Readings from Last 3 Encounters:  03/22/21 87 kg  02/21/21 86.2 kg  02/04/21 86.2 kg     Intake/Output Summary (Last 24 hours) at 04/02/2021 1036 Last data filed at 04/02/2021 0915 Gross per  24 hour  Intake 228.31 ml  Output 1250 ml  Net -1021.69 ml     Physical Exam  Awake Alert, No new F.N deficits, Normal affect Randy [name],PERRAL Supple Neck, No JVD,   Symmetrical Chest wall movement, Good air movement bilaterally, CTAB RRR,No Gallops,Rubs or new Murmurs,  +ve B.Sounds, Abd Soft, mild epigastric tenderness,   No Cyanosis, Clubbing or edema      Data Review:    CBC Recent Labs  Lab 03/28/21 0842 03/31/21 2353 04/02/21 0138  WBC 7.4 8.5 8.5  HGB 11.4* 10.6* 10.2*  HCT 36.8* 32.3* 31.3*  PLT 291 283 265  MCV 88.2 84.6 83.9  MCH 27.3 27.7 27.3  MCHC 31.0 32.8 32.6  RDW 12.4 12.5 12.3  LYMPHSABS  --  2.0 1.7  MONOABS  --  0.5 0.4  EOSABS  --  0.1 0.0  BASOSABS  --  0.1 0.1    Electrolytes Recent Labs  Lab 03/28/21 0842 03/31/21 2353 04/02/21 0022 04/02/21 0554  NA 132* 135 133* 135  K 5.4* 4.6 4.2 4.7  CL 101 102 102 101  CO2 23 24 23 24   GLUCOSE 203* 153* 167* 149*  BUN 18 23* 28* 19  CREATININE 1.44* 1.90* 2.46* 1.89*  CALCIUM 8.8* 8.8* 8.5* 8.8*  AST 35 22 21  --   ALT 25  13 17  --   ALKPHOS 92 96 94  --   BILITOT 1.3* 0.9 0.6  --   ALBUMIN 3.2* 3.1* 3.2*  --   HGBA1C  --   --   --  7.7*    ------------------------------------------------------------------------------------------------------------------ Recent Labs    04/02/21 0554  CHOL 310*  HDL 32*  LDLCALC 246*  TRIG 159*  CHOLHDL 9.7    Lab Results  Component Value Date   HGBA1C 7.7 (H) 04/02/2021    No results for input(s): TSH, T4TOTAL, T3FREE, THYROIDAB in the last 72 hours.  Invalid input(s): FREET3 ------------------------------------------------------------------------------------------------------------------ ID Labs Recent Labs  Lab 03/28/21 0842 03/31/21 2353 04/02/21 0022 04/02/21 0138 04/02/21 0554  WBC 7.4 8.5  --  8.5  --   PLT 291 283  --  265  --   CREATININE 1.44* 1.90* 2.46*  --  1.89*   Cardiac Enzymes No results for input(s): CKMB,  TROPONINI, MYOGLOBIN in the last 168 hours.  Invalid input(s): CK  Radiology Reports CT ABDOMEN PELVIS W CONTRAST  Result Date: 04/02/2021 CLINICAL DATA:  Nausea and vomiting with epigastric pain and history of pancreatitis EXAM: CT ABDOMEN AND PELVIS WITH CONTRAST TECHNIQUE: Multidetector CT imaging of the abdomen and pelvis was performed using the standard protocol following bolus administration of intravenous contrast. CONTRAST:  28mL OMNIPAQUE IOHEXOL 300 MG/ML  SOLN COMPARISON:  03/22/2021 FINDINGS: Lower chest: No acute abnormality. Hepatobiliary: No focal liver abnormality is seen. No gallstones, gallbladder wall thickening, or biliary dilatation. Pancreas: Pancreas is well visualized. Some minimal peripancreatic inflammatory changes are noted adjacent to the head of the pancreas which may represent some very early mild pancreatitis. Spleen: Normal in size without focal abnormality. Adrenals/Urinary Tract: Adrenal glands are within normal limits. Kidneys are well visualize without renal calculi or obstructive changes. Ureters are within limits. Bladder is partially distended. Stomach/Bowel: No obstructive or inflammatory changes of colon noted. The appendix is within normal limits. No inflammatory changes are seen. Small bowel and stomach are within normal limits. Vascular/Lymphatic: Aortic atherosclerosis. No enlarged abdominal or pelvic lymph nodes. Reproductive: Prostate is unremarkable. Other: No abdominal wall hernia or abnormality. No abdominopelvic ascites. Musculoskeletal: No acute or significant osseous findings. IMPRESSION: Changes suggestive of minimal pancreatitis in the region of the pancreatic head. Correlate with laboratory values. No other focal abnormality is noted. Electronically Signed   By: Alcide Clever M.D.   On: 04/02/2021 01:29

## 2021-04-02 NOTE — Progress Notes (Signed)
   04/02/21 1825  Assess: MEWS Score  Temp 98.4 F (36.9 C)  BP (!) 207/97  Pulse Rate (!) 102  Resp 18  SpO2 100 %  Assess: MEWS Score  MEWS Temp 0  MEWS Systolic 2  MEWS Pulse 1  MEWS RR 0  MEWS LOC 0  MEWS Score 3  MEWS Score Color Yellow  Assess: if the MEWS score is Yellow or Red  Were vital signs taken at a resting state? Yes  Focused Assessment No change from prior assessment  MEWS guidelines implemented *See Row Information* Yes  Treat  MEWS Interventions Administered scheduled meds/treatments;Administered prn meds/treatments;Other (Comment) (Dr Thedore Mins made aware)  Pain Scale 0-10  Take Vital Signs  Increase Vital Sign Frequency  Yellow: Q 2hr X 2 then Q 4hr X 2, if remains yellow, continue Q 4hrs  Escalate  MEWS: Escalate Yellow: discuss with charge nurse/RN and consider discussing with provider and RRT  Notify: Charge Nurse/RN  Name of Charge Nurse/RN Notified Fuller Mandril Rn  Date Charge Nurse/RN Notified 04/02/21  Time Charge Nurse/RN Notified 1855  Notify: Provider  Provider Name/Title Dr. Thedore Mins  Date Provider Notified 04/02/21  Time Provider Notified [phone]  Notification Type Call  Notification Reason Other (Comment) (Yellow MEWS)  Provider response See new orders  Date of Provider Response 04/02/21  Time of Provider Response 1853  Notify: Rapid Response  Name of Rapid Response RN Notified not applicable  Document  Patient Outcome Other (Comment) (Continuing to monitor)  Progress note created (see row info) Yes
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
## 2021-04-02 NOTE — Progress Notes (Signed)
Patient's BP 207/97, Dr. Thedore Mins made aware.  MD ordered hydralazine 10 mg x 1 dose and administer Coreg that has been held in ED.  Orders carried out.  Pt in no distress, kept warm and comfortable.  Report given to night RN.

## 2021-04-02 NOTE — H&P (Signed)
History and Physical    Randy [name]. YTK:[id number] DOB: 1981-04-03 DOA: 04/01/2021  PCP: Camillia Herter, NP Patient coming from: Home  Chief Complaint: Abdominal pain, nausea, vomiting  HPI: Randy [name]. is a 40 y.o. male with medical history significant of hypertension, hyperlipidemia, type 2 diabetes, GERD, marijuana abuse, admitted for pancreatitis in November 2021 and again in May 2022 (he left AMA during this hospitalization).  He presented to the ED yesterday evening complaining of epigastric abdominal pain, nausea, and vomiting x2 days.  Blood pressure elevated at 180/100 at triage.  Not febrile.  Labs showing WBC 8.5, hemoglobin 10.2 (stable), platelet count 265k.  Sodium 133, potassium 4.2, chloride 102, bicarb 23, BUN 28, creatinine 2.4 (baseline 1.1), glucose 167.  Lipase 248.  LFTs normal.  CT showing findings consistent with minimal pancreatitis in the region of the pancreatic head. Patient was given Dilaudid, morphine, Zofran, and 2.5 L fluid boluses.   Patient states yesterday at 3 PM he started having severe abdominal pain, nausea, and vomiting.  He has vomited 4 times since then and has not been able to eat.  Abdominal pain is in the epigastric region.  He thinks his symptoms are consistent with prior pancreatitis episodes.  He last smoked marijuana 3 days ago.  Denies alcohol use.  No other complaints.  Denies fevers, cough, shortness of breath, or chest pain.  Review of Systems:  All systems reviewed and apart from history of presenting illness, are negative.  Past Medical History:  Diagnosis Date   Essential hypertension 05/31/2019   HLD (hyperlipidemia)    Hypertension    Pancreatitis    Type II diabetes mellitus ([place])     Past Surgical History:  Procedure Laterality Date   NO PAST SURGERIES       reports that he quit smoking about 9 years ago. His smoking use included cigarettes. He has a 5.61 pack-year smoking history. He has never used smokeless  tobacco. He reports current drug use. Drug: Marijuana. He reports that he does not drink alcohol.  Allergies  Allergen Reactions   Tomato     Family History  Problem Relation Age of Onset   Hypertension Mother    Diabetes Father    Hypertension Father    Diabetes Brother    Diabetes Maternal Grandmother    Colon cancer Neg Hx    Esophageal cancer Neg Hx    Rectal cancer Neg Hx    Stomach cancer Neg Hx     Prior to Admission medications   Medication Sig Start Date End Date Taking? Authorizing Provider  amLODipine (NORVASC) 10 MG tablet Take 1 tablet (10 mg total) by mouth daily. 01/12/21 02/18/21  Dorna Mai, MD  atorvastatin (LIPITOR) 80 MG tablet Take 1 tablet (80 mg total) by mouth daily. 11/02/20 01/31/21  Camillia Herter, NP  Blood Glucose Monitoring Suppl (TRUE METRIX METER) w/Device KIT Use as directed 10/30/20   Camillia Herter, NP  carvedilol (COREG) 25 MG tablet Take 1 tablet (25 mg total) by mouth 2 (two) times daily with a meal. 01/12/21 02/18/21  Dorna Mai, MD  cephALEXin (KEFLEX) 500 MG capsule Take 1 capsule (500 mg total) by mouth 4 (four) times daily. 09/14/20   Daleen Bo, MD  glipiZIDE (GLUCOTROL) 10 MG tablet Take 1 tablet (10 mg total) by mouth 2 (two) times daily before a meal. 01/12/21 02/18/21  Dorna Mai, MD  glucose blood (TRUE METRIX BLOOD GLUCOSE TEST) test strip Use as instructed 10/30/20  Camillia Herter, NP  HYDROcodone-acetaminophen (NORCO/VICODIN) 5-325 MG tablet Take 1 tablet by mouth every 6 (six) hours as needed. 03/28/21   Sherrill Raring, PA-C  insulin glargine (LANTUS) 100 UNIT/ML Solostar Pen Inject 30 Units into the skin daily. 10/30/20 12/09/20  Camillia Herter, NP  Insulin Pen Needle (PEN NEEDLES) 31G X 8 MM MISC USE AS DIRECTED 10/30/20   Camillia Herter, NP  lisinopril (ZESTRIL) 40 MG tablet Take 1 tablet (40 mg total) by mouth daily. 01/12/21 02/18/21  Dorna Mai, MD  omeprazole (PRILOSEC) 40 MG capsule Take 1 capsule (40 mg total) by mouth  daily. 09/13/20   Geradine Girt, DO  ondansetron (ZOFRAN ODT) 4 MG disintegrating tablet Take 1 tablet (4 mg total) by mouth every 8 (eight) hours as needed for nausea or vomiting. 03/28/21   Sherrill Raring, PA-C  sildenafil (VIAGRA) 100 MG tablet TAKE 0.5-1 TABLETS (50-100 MG TOTAL) BY MOUTH DAILY AS NEEDED FOR ERECTILE DYSFUNCTION. 01/12/21 01/12/22  Dorna Mai, MD  TRUEplus Lancets 28G MISC Use as directed 10/30/20   Camillia Herter, NP    Physical Exam: Vitals:   04/02/21 0215 04/02/21 0245 04/02/21 0300 04/02/21 0345  BP: (!) 154/93 (!) 154/95  139/82  Pulse: 91 93    Resp: 16 16    Temp:      TempSrc:      SpO2: 94% 100% 95%     Physical Exam Constitutional:      General: He is not in acute distress. HENT:     Head: Normocephalic and atraumatic.  Eyes:     Extraocular Movements: Extraocular movements intact.     Conjunctiva/sclera: Conjunctivae normal.  Cardiovascular:     Rate and Rhythm: Normal rate and regular rhythm.     Pulses: Normal pulses.  Pulmonary:     Effort: Pulmonary effort is normal. No respiratory distress.     Breath sounds: Normal breath sounds. No wheezing or rales.  Abdominal:     General: Bowel sounds are normal. There is no distension.     Palpations: Abdomen is soft.     Tenderness: There is no abdominal tenderness. There is no guarding or rebound.  Musculoskeletal:        General: No swelling or tenderness.     Cervical back: Normal range of motion and neck supple.  Skin:    General: Skin is warm and dry.  Neurological:     General: No focal deficit present.     Mental Status: He is alert and oriented to person, place, and time.     Labs on Admission: I have personally reviewed following labs and imaging studies  CBC: Recent Labs  Lab 03/28/21 0842 03/31/21 2353 04/02/21 0138  WBC 7.4 8.5 8.5  NEUTROABS  --  5.8 6.2  HGB 11.4* 10.6* 10.2*  HCT 36.8* 32.3* 31.3*  MCV 88.2 84.6 83.9  PLT 291 283 765   Basic Metabolic Panel: Recent  Labs  Lab 03/28/21 0842 03/31/21 2353 04/02/21 0022  NA 132* 135 133*  K 5.4* 4.6 4.2  CL 101 102 102  CO2 _0 GLUCOSE 203* 153* 167*  BUN 18 23* 28*  CREATININE 1.44* 1.90* 2.46*  CALCIUM 8.8* 8.8* 8.5*   GFR: Estimated Creatinine Clearance: 42.4 mL/min (A) (by C-G formula based on SCr of 2.46 mg/dL (H)). Liver Function Tests: Recent Labs  Lab 03/28/21 0842 03/31/21 2353 04/02/21 0022  AST 35 22 21  ALT _1 ALKPHOS 92 96  94  BILITOT 1.3* 0.9 0.6  PROT 6.9 6.9 6.8  ALBUMIN 3.2* 3.1* 3.2*   Recent Labs  Lab 03/28/21 0842 03/31/21 2353 04/02/21 0022  LIPASE 65* 306* 248*   No results for input(s): AMMONIA in the last 168 hours. Coagulation Profile: No results for input(s): INR, PROTIME in the last 168 hours. Cardiac Enzymes: No results for input(s): CKTOTAL, CKMB, CKMBINDEX, TROPONINI in the last 168 hours. BNP (last 3 results) No results for input(s): PROBNP in the last 8760 hours. HbA1C: No results for input(s): HGBA1C in the last 72 hours. CBG: Recent Labs  Lab 04/02/21 0009  GLUCAP 159*   Lipid Profile: No results for input(s): CHOL, HDL, LDLCALC, TRIG, CHOLHDL, LDLDIRECT in the last 72 hours. Thyroid Function Tests: No results for input(s): TSH, T4TOTAL, FREET4, T3FREE, THYROIDAB in the last 72 hours. Anemia Panel: No results for input(s): VITAMINB12, FOLATE, FERRITIN, TIBC, IRON, RETICCTPCT in the last 72 hours. Urine analysis:    Component Value Date/Time   COLORURINE YELLOW 03/31/2021 2332   APPEARANCEUR HAZY (A) 03/31/2021 2332   LABSPEC 1.024 03/31/2021 2332   PHURINE 5.0 03/31/2021 2332   GLUCOSEU NEGATIVE 03/31/2021 2332   HGBUR SMALL (A) 03/31/2021 2332   BILIRUBINUR NEGATIVE 03/31/2021 2332   KETONESUR 5 (A) 03/31/2021 2332   PROTEINUR >=300 (A) 03/31/2021 2332   UROBILINOGEN 1.0 10/19/2013 2130   NITRITE NEGATIVE 03/31/2021 2332   LEUKOCYTESUR NEGATIVE 03/31/2021 2332    Radiological Exams on Admission: CT ABDOMEN  PELVIS W CONTRAST  Result Date: 04/02/2021 CLINICAL DATA:  Nausea and vomiting with epigastric pain and history of pancreatitis EXAM: CT ABDOMEN AND PELVIS WITH CONTRAST TECHNIQUE: Multidetector CT imaging of the abdomen and pelvis was performed using the standard protocol following bolus administration of intravenous contrast. CONTRAST:  24m OMNIPAQUE IOHEXOL 300 MG/ML  SOLN COMPARISON:  03/22/2021 FINDINGS: Lower chest: No acute abnormality. Hepatobiliary: No focal liver abnormality is seen. No gallstones, gallbladder wall thickening, or biliary dilatation. Pancreas: Pancreas is well visualized. Some minimal peripancreatic inflammatory changes are noted adjacent to the head of the pancreas which may represent some very early mild pancreatitis. Spleen: Normal in size without focal abnormality. Adrenals/Urinary Tract: Adrenal glands are within normal limits. Kidneys are well visualize without renal calculi or obstructive changes. Ureters are within limits. Bladder is partially distended. Stomach/Bowel: No obstructive or inflammatory changes of colon noted. The appendix is within normal limits. No inflammatory changes are seen. Small bowel and stomach are within normal limits. Vascular/Lymphatic: Aortic atherosclerosis. No enlarged abdominal or pelvic lymph nodes. Reproductive: Prostate is unremarkable. Other: No abdominal wall hernia or abnormality. No abdominopelvic ascites. Musculoskeletal: No acute or significant osseous findings. IMPRESSION: Changes suggestive of minimal pancreatitis in the region of the pancreatic head. Correlate with laboratory values. No other focal abnormality is noted. Electronically Signed   By: MInez CatalinaM.D.   On: 04/02/2021 01:29    Assessment/Plan Principal Problem:   Acute pancreatitis Active Problems:   Hypertensive urgency   Hyperlipidemia   Type 2 diabetes mellitus without complication, with long-term current use of insulin (HCC)   Nausea & vomiting   Recurrent  pancreatitis Patient here for evaluation of acute onset epigastric abdominal pain, nausea, and vomiting.  Lipase 248.  CT showing findings consistent with minimal pancreatitis in the region of the pancreatic head. MRCP done during hospitalization in November 2021 revealed mild extrahepatic biliary duct dilation but no evidence of choledocholithiasis or pancreatitis.  Triglyceride level was not extremely elevated at that time.  He was  referred to gastroenterology (Dr. Silverio Decamp) for endoscopic ultrasound given extrahepatic biliary duct dilation on MRCP.  Patient denies alcohol use.  LFTs currently normal. -Patient was given 2.5 L fluid boluses in the ED, continue IV fluid hydration.  Antiemetic as needed.  Morphine as needed for pain.  Check level of triglycerides again.  Trend lipase.   Intractable nausea and vomiting Although pancreatitis could be contributing, it is mild in severity.  Could also possibly be due to marijuana use and/ or diabetic gastroparesis. -Antiemetic as needed, UDS  AKI Likely prerenal from dehydration.  BUN 28, creatinine 2.4 (baseline 1.1).  CT without renal calculi or obstructive changes. -IV fluid hydration.  Monitor renal function and urine output.  Avoid nephrotoxic agents/hold home lisinopril.  Hypertensive urgency Blood pressure elevated at 180/100 at triage, improved after pain management. -Resume amlodipine and Coreg after pharmacy med rec is done.  Hold lisinopril given AKI.  Hydralazine prn.   Type 2 diabetes -Sliding scale insulin sensitive at this time given poor oral intake.  Resume home basal insulin after pharmacy med rec is done.  DVT prophylaxis: Lovenox Code Status: Full code Family Communication: No family available at this time. Disposition Plan: Status is: Observation  The patient remains OBS appropriate and will d/c before 2 midnights.  Level of care: Level of care: Med-Surg  The medical decision making on this patient was of high complexity  and the patient is at high risk for clinical deterioration, therefore this is a level 3 visit.  Shela Leff MD [facility]  If 7PM-7AM, please contact night-coverage [email or web address]  04/02/2021, 5:28 AM

## 2021-04-03 DIAGNOSIS — I160 Hypertensive urgency: Secondary | ICD-10-CM

## 2021-04-03 DIAGNOSIS — N179 Acute kidney failure, unspecified: Secondary | ICD-10-CM

## 2021-04-03 LAB — GLUCOSE, CAPILLARY
Glucose-Capillary: 192 mg/dL — ABNORMAL HIGH (ref 70–99)
Glucose-Capillary: 238 mg/dL — ABNORMAL HIGH (ref 70–99)
Glucose-Capillary: 145 mg/dL — ABNORMAL HIGH (ref 70–99)
Glucose-Capillary: 278 mg/dL — ABNORMAL HIGH (ref 70–99)

## 2021-04-03 LAB — COMPREHENSIVE METABOLIC PANEL
ALT: 20 U/L (ref 0–44)
AST: 23 U/L (ref 15–41)
Albumin: 3.4 g/dL — ABNORMAL LOW (ref 3.5–5.0)
Alkaline Phosphatase: 115 U/L (ref 38–126)
Anion gap: 15 (ref 5–15)
BUN: 17 mg/dL (ref 6–20)
CO2: 20 mmol/L — ABNORMAL LOW (ref 22–32)
Calcium: 9.4 mg/dL (ref 8.9–10.3)
Chloride: 103 mmol/L (ref 98–111)
Creatinine, Ser: 1.62 mg/dL — ABNORMAL HIGH (ref 0.61–1.24)
GFR, Estimated: 54 mL/min — ABNORMAL LOW (ref >60)
Glucose, Bld: 155 mg/dL — ABNORMAL HIGH (ref 70–99)
Potassium: 4.3 mmol/L (ref 3.5–5.1)
Sodium: 138 mmol/L (ref 135–145)
Total Bilirubin: 1.2 mg/dL (ref 0.3–1.2)
Total Protein: 8.4 g/dL — ABNORMAL HIGH (ref 6.5–8.1)

## 2021-04-03 LAB — MAGNESIUM: Magnesium: 1.9 mg/dL (ref 1.7–2.4)

## 2021-04-03 LAB — CBC WITH DIFFERENTIAL/PLATELET
Abs Immature Granulocytes: 0.02 10*3/uL (ref 0.00–0.07)
Basophils Absolute: 0.1 10*3/uL (ref 0.0–0.1)
Basophils Relative: 1 %
Eosinophils Absolute: 0.3 10*3/uL (ref 0.0–0.5)
Eosinophils Relative: 3 %
HCT: 38 % — ABNORMAL LOW (ref 39.0–52.0)
Hemoglobin: 12 g/dL — ABNORMAL LOW (ref 13.0–17.0)
Immature Granulocytes: 0 %
Lymphocytes Relative: 26 %
Lymphs Abs: 2.3 10*3/uL (ref 0.7–4.0)
MCH: 27.1 pg (ref 26.0–34.0)
MCHC: 31.6 g/dL (ref 30.0–36.0)
MCV: 85.8 fL (ref 80.0–100.0)
Monocytes Absolute: 0.4 10*3/uL (ref 0.1–1.0)
Monocytes Relative: 4 %
Neutro Abs: 5.8 10*3/uL (ref 1.7–7.7)
Neutrophils Relative %: 66 %
Platelets: 388 10*3/uL (ref 150–400)
RBC: 4.43 MIL/uL (ref 4.22–5.81)
RDW: 12.4 % (ref 11.5–15.5)
WBC: 8.8 10*3/uL (ref 4.0–10.5)
nRBC: 0 % (ref 0.0–0.2)

## 2021-04-03 LAB — LIPASE, BLOOD: Lipase: 48 U/L (ref 11–51)

## 2021-04-03 LAB — OSMOLALITY: Osmolality: 304 mOsm/kg — ABNORMAL HIGH (ref 275–295)

## 2021-04-03 LAB — UREA NITROGEN, URINE: Urea Nitrogen, Ur: 264 mg/dL

## 2021-04-03 MED ORDER — PROCHLORPERAZINE EDISYLATE 10 MG/2ML IJ SOLN
10.0000 mg | Freq: Once | INTRAMUSCULAR | Status: AC
  Administered 2021-04-03: 10 mg via INTRAVENOUS
  Filled 2021-04-03: qty 2

## 2021-04-03 MED ORDER — BACITRACIN-NEOMYCIN-POLYMYXIN OINTMENT TUBE
TOPICAL_OINTMENT | Freq: Three times a day (TID) | CUTANEOUS | Status: DC
  Filled 2021-04-03: qty 14

## 2021-04-03 MED ORDER — SODIUM CHLORIDE 0.9 % IV SOLN: INTRAVENOUS | Status: AC

## 2021-04-03 MED ORDER — ISOSORBIDE MONONITRATE ER 30 MG PO TB24
30.0000 mg | ORAL_TABLET | Freq: Every day | ORAL | Status: DC
  Administered 2021-04-03 – 2021-04-04 (×2): 30 mg via ORAL
  Filled 2021-04-03 (×2): qty 1

## 2021-04-03 MED ORDER — CARVEDILOL 12.5 MG PO TABS
12.5000 mg | ORAL_TABLET | Freq: Two times a day (BID) | ORAL | Status: DC
  Administered 2021-04-03 – 2021-04-04 (×3): 12.5 mg via ORAL
  Filled 2021-04-03 (×3): qty 1

## 2021-04-03 MED ORDER — PROCHLORPERAZINE EDISYLATE 10 MG/2ML IJ SOLN: 10.0000 mg | Freq: Four times a day (QID) | INTRAMUSCULAR | Status: DC | PRN

## 2021-04-03 NOTE — Plan of Care (Signed)

## 2021-04-03 NOTE — Progress Notes (Signed)
Patient wanted to take a shower and messaged MD for order. Patient requesting to have the Zofran and pain medication when gets off shower. I told him to call when he is ready. Patient seems comfortable and able to move with no difficulty. No complaints of nausea or vomiting noted. Had been taking PO liquids.

## 2021-04-03 NOTE — Progress Notes (Addendum)
Daily Rounding Note  04/03/2021, 9:40 AM  LOS: 1 day   SUBJECTIVE:   Chief complaint:  recurrent pancreatitis of undetermined cause.     Overall feeling better.  Pain is well managed but persists in upper abdomen and radiating into the mid to upper thoracic area in the back.  Tolerating applesauce and juices but Jell-O makes him a little sick to his stomach.  No emesis.  No bowel movements. Urine output recorded of 925 mL yesterday, not clear it is being precisely measured.  OBJECTIVE:         Vital signs in last 24 hours:    Temp:  [98.1 F (36.7 C)-98.7 F (37.1 C)] 98.1 F (36.7 C) (11/22 0457) Pulse Rate:  [87-106] 98 (11/22 0457) Resp:  [11-22] 18 (11/22 0457) BP: (164-232)/(72-108) 184/101 (11/22 0457) SpO2:  [95 %-100 %] 98 % (11/22 0457) Weight:  [86.4 kg] 86.4 kg (11/21 1825)   Filed Weights   04/02/21 1825  Weight: 86.4 kg   General: NAD.  Looks well.  Currently comfortable. Heart: RRR. Chest: Clear bilaterally.  No labored breathing or cough Abdomen: Soft with mild to at most moderate tenderness across the mid to upper abdomen.  No guarding or rebound.  No distention.  Active bowel sounds. Extremities: No CCE. Neuro/Psych: Pleasant, cooperative.  Fluid speech.  Fully oriented.  No gross deficits or tremors.  Intake/Output from previous day: 11/21 0701 - 11/22 0700 In: 2234 [I.V.:2234] Out: 1075 [Urine:925; Emesis/NG output:150]  Intake/Output this shift: No intake/output data recorded.  Lab Results: Recent Labs    03/31/21 2353 04/02/21 0138 04/03/21 0238  WBC 8.5 8.5 8.8  HGB 10.6* 10.2* 12.0*  HCT 32.3* 31.3* 38.0*  PLT 283 265 388   BMET Recent Labs    04/02/21 0022 04/02/21 0554 04/03/21 0238  NA 133* 135 138  K 4.2 4.7 4.3  CL 102 101 103  CO2 23 24 20*  GLUCOSE 167* 149* 155*  BUN 28* 19 17  CREATININE 2.46* 1.89* 1.62*  CALCIUM 8.5* 8.8* 9.4   LFT Recent Labs     03/31/21 2353 04/02/21 0022 04/03/21 0238  PROT 6.9 6.8 8.4*  ALBUMIN 3.1* 3.2* 3.4*  AST 22 21 23   ALT 13 17 20   ALKPHOS 96 94 115  BILITOT 0.9 0.6 1.2   PT/INR No results for input(s): LABPROT, INR in the last 72 hours. Hepatitis Panel No results for input(s): HEPBSAG, HCVAB, HEPAIGM, HEPBIGM in the last 72 hours.  Studies/Results: CT ABDOMEN PELVIS W CONTRAST  Result Date: 04/02/2021 CLINICAL DATA:  Nausea and vomiting with epigastric pain and history of pancreatitis EXAM: CT ABDOMEN AND PELVIS WITH CONTRAST TECHNIQUE: Multidetector CT imaging of the abdomen and pelvis was performed using the standard protocol following bolus administration of intravenous contrast. CONTRAST:  41mL OMNIPAQUE IOHEXOL 300 MG/ML  SOLN COMPARISON:  03/22/2021 FINDINGS: Lower chest: No acute abnormality. Hepatobiliary: No focal liver abnormality is seen. No gallstones, gallbladder wall thickening, or biliary dilatation. Pancreas: Pancreas is well visualized. Some minimal peripancreatic inflammatory changes are noted adjacent to the head of the pancreas which may represent some very early mild pancreatitis. Spleen: Normal in size without focal abnormality. Adrenals/Urinary Tract: Adrenal glands are within normal limits. Kidneys are well visualize without renal calculi or obstructive changes. Ureters are within limits. Bladder is partially distended. Stomach/Bowel: No obstructive or inflammatory changes of colon noted. The appendix is within normal limits. No inflammatory changes are seen. Small bowel and  stomach are within normal limits. Vascular/Lymphatic: Aortic atherosclerosis. No enlarged abdominal or pelvic lymph nodes. Reproductive: Prostate is unremarkable. Other: No abdominal wall hernia or abnormality. No abdominopelvic ascites. Musculoskeletal: No acute or significant osseous findings. IMPRESSION: Changes suggestive of minimal pancreatitis in the region of the pancreatic head. Correlate with laboratory  values. No other focal abnormality is noted. Electronically Signed   By: Alcide Clever M.D.   On: 04/02/2021 01:29    Scheduled Meds:  amLODipine  10 mg Oral Daily   carvedilol  12.5 mg Oral BID WC   enoxaparin (LOVENOX) injection  40 mg Subcutaneous Q24H   insulin aspart  0-5 Units Subcutaneous QHS   insulin aspart  0-9 Units Subcutaneous TID WC   isosorbide mononitrate  30 mg Oral Daily   pantoprazole  40 mg Oral Daily   Continuous Infusions:  sodium chloride 50 mL/hr at 04/03/21 0833   PRN Meds:.acetaminophen **OR** acetaminophen, diphenhydrAMINE, hydrALAZINE, HYDROmorphone (DILAUDID) injection, ondansetron (ZOFRAN) IV, oxyCODONE-acetaminophen  ASSESMENT:     Acute pancreatitis.   Recurrent.  Cause not yet determined.  No ETOH use, no gallstones, n LFTs normal, trigs as high as 377 one year ago/159 currently.  CBD prominent at 8.2 mm per [place] and MRCP of 03/2020, but normal on all CT scans. IgG4, CA 19-9, CEA pndg.  Lipase now normal.      Hypoatremia, resolved.      DM2.    AKI.  Improved.  GFR 33 ... 54.  IV fluids consist of NS at 50 mL/hour.   PLAN     Repeat ultrasound??     Surgical consult re lap chole??      Increase IV fluids to 125/hour.  Strict I/Os.     Jennye Moccasin  04/03/2021, 9:40 AM Phone [phone]   Attending Attestation:  Late entry. Patient seen yesterday morning after he ate breakfast. He ate two containers of applesauce but did not enjoy the jello. He was having no pain, nausea, or vomiting.  At that time, I reviewed the chart and examined the patient. No family was present at the time of my evaluation.  I agree with the Advanced Practitioner's note, impression and recommendations.  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 Lipase is now normal.   Increase IV fluids. Diet advance as tolerated. IgG4, CEA, and Ca-19-9 are all pending at this time. No further inpatient evaluation planned at this time. Will consider elective outpatient EUS in the  future. Patient was eager to consider surgical consultation, as well, although there is no firm evidence for biliary pancreatitis. Plan discharge when he is able to tolerate a regular diet.     Tressia Danas, MD, MPH [facility]

## 2021-04-03 NOTE — Progress Notes (Signed)
PROGRESS NOTE                                                                                                                                                                                                             Patient Demographics:    Randy [name], is a 40 y.o. male, DOB - April 07, 1981, ZOX:[id number]  Outpatient Primary MD for the patient is Rema Fendt, NP    LOS - 1  Admit date - 04/01/2021    Chief Complaint  Patient presents with   Abdominal Pain       Brief Narrative (HPI from H&P)   Randy [name]. is a 40 y.o. male with medical history significant of hypertension, hyperlipidemia, type 2 diabetes, GERD, marijuana abuse, admitted for pancreatitis in November 2021 and again in May 2022 (he left AMA during this hospitalization).  He now presents with reoccurrence of epigastric abdominal pain which is sharp in nature radiating to his back, CT suggestive of acute pancreatitis, he was admitted for further treatment.   Subjective:   Patient in bed, appears comfortable, denies any headache, no fever, no chest pain or pressure, no shortness of breath , mild nausea & abdominal pain. No new focal weakness.    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  No history of alcohol abuse, stable LFTs and triglyceride levels, is on statin which is on hold, lipase is high and CT is suggestive of acute pancreatitis.  Continue supportive care with IV fluids and bowel rest, lipase is trending down, abdominal exam benign, appreciate GI input.  Pending IgG4, CEA and CEA 19.9.  2.  AKI on CKD 3A (creat 1.4) .  FeNA is still over 1, continue IV fluids mild improvement.  CT scan does not show any ureteric obstruction or stones.  3.  Essential hypertension.  Blood pressure medications adjusted further at 04/03/2021 for better control.  4.  Dyslipidemia.  Holding statin due to  pancreatitis.  5.  DM type II.  For now sliding scale insulin and monitor.  Lab Results  Component Value Date   HGBA1C 7.7 (H) 04/02/2021    CBG (last 3)  Recent Labs    04/02/21 1830 04/02/21 2054 04/03/21 0644  GLUCAP 170* 142* 192*  Condition - Fair  Family Communication  :  None present  Code Status :  Full  Consults  :  GI  PUD Prophylaxis : PPI   Procedures  :      CT abdomen and pelvis.  Mild pancreatic inflammation suggestive of acute pancreatitis.      Disposition Plan  :    Status is: Observation    DVT Prophylaxis  :    enoxaparin (LOVENOX) injection 40 mg Start: 04/02/21 1000    Lab Results  Component Value Date   PLT 388 04/03/2021    Diet :  Diet Order             Diet clear liquid Room service appropriate? Yes; Fluid consistency: Thin  Diet effective now                    Inpatient Medications  Scheduled Meds:  amLODipine  10 mg Oral Daily   carvedilol  12.5 mg Oral BID WC   enoxaparin (LOVENOX) injection  40 mg Subcutaneous Q24H   insulin aspart  0-5 Units Subcutaneous QHS   insulin aspart  0-9 Units Subcutaneous TID WC   isosorbide mononitrate  30 mg Oral Daily   neomycin-bacitracin-polymyxin   Topical TID   pantoprazole  40 mg Oral Daily   Continuous Infusions:  sodium chloride 50 mL/hr at 04/03/21 0955   PRN Meds:.acetaminophen **OR** acetaminophen, diphenhydrAMINE, hydrALAZINE, HYDROmorphone (DILAUDID) injection, ondansetron (ZOFRAN) IV, oxyCODONE-acetaminophen  Antibiotics  :    Anti-infectives (From admission, onward)    None        Time Spent in minutes  30   Susa Raring M.D on 04/03/2021 at 10:50 AM  To page go to [email or web address]   [facility] -  Office  [phone]  See all Orders from today for further details    Objective:   Vitals:   04/02/21 1825 04/02/21 2042 04/03/21 0457 04/03/21 1034  BP: (!) 207/97 (!) 177/78 (!) 184/101 (!) 145/85  Pulse: (!) 102 (!) 105 98  95  Resp: 18 16 18 17   Temp: 98.4 F (36.9 C) 98.7 F (37.1 C) 98.1 F (36.7 C) 98.6 F (37 C)  TempSrc:  Oral Oral Oral  SpO2: 100% 100% 98% 98%  Weight: 86.4 kg     Height: 5\' 7"  (1.702 m)       Wt Readings from Last 3 Encounters:  04/02/21 86.4 kg  03/22/21 87 kg  02/21/21 86.2 kg     Intake/Output Summary (Last 24 hours) at 04/03/2021 1050 Last data filed at 04/03/2021 0955 Gross per 24 hour  Intake 2381.04 ml  Output 625 ml  Net 1756.04 ml     Physical Exam  Awake Alert, No new F.N deficits, Normal affect [place].AT,PERRAL Supple Neck, No JVD,   Symmetrical Chest wall movement, Good air movement bilaterally, CTAB RRR,No Gallops, Rubs or new Murmurs,  +ve B.Sounds, Abd Soft, No tenderness,   No Cyanosis, Clubbing or edema       Data Review:    CBC Recent Labs  Lab 03/28/21 0842 03/31/21 2353 04/02/21 0138 04/03/21 0238  WBC 7.4 8.5 8.5 8.8  HGB 11.4* 10.6* 10.2* 12.0*  HCT 36.8* 32.3* 31.3* 38.0*  PLT 291 283 265 388  MCV 88.2 84.6 83.9 85.8  MCH 27.3 27.7 27.3 27.1  MCHC 31.0 32.8 32.6 31.6  RDW 12.4 12.5 12.3 12.4  LYMPHSABS  --  2.0 1.7 2.3  MONOABS  --  0.5 0.4 0.4  EOSABS  --  0.1 0.0 0.3  BASOSABS  --  0.1 0.1 0.1    Electrolytes Recent Labs  Lab 03/28/21 0842 03/31/21 2353 04/02/21 0022 04/02/21 0554 04/03/21 0238  NA 132* 135 133* 135 138  K 5.4* 4.6 4.2 4.7 4.3  CL 101 102 102 101 103  CO2 23 24 23 24  20*  GLUCOSE 203* 153* 167* 149* 155*  BUN 18 23* 28* 19 17  CREATININE 1.44* 1.90* 2.46* 1.89* 1.62*  CALCIUM 8.8* 8.8* 8.5* 8.8* 9.4  AST 35 22 21  --  23  ALT 25 13 17   --  20  ALKPHOS 92 96 94  --  115  BILITOT 1.3* 0.9 0.6  --  1.2  ALBUMIN 3.2* 3.1* 3.2*  --  3.4*  MG  --   --   --   --  1.9  HGBA1C  --   --   --  7.7*  --     ------------------------------------------------------------------------------------------------------------------ Recent Labs    04/02/21 0554  CHOL 310*  HDL 32*  LDLCALC 246*  TRIG  159*  CHOLHDL 9.7    Lab Results  Component Value Date   HGBA1C 7.7 (H) 04/02/2021    No results for input(s): TSH, T4TOTAL, T3FREE, THYROIDAB in the last 72 hours.  Invalid input(s): FREET3 ------------------------------------------------------------------------------------------------------------------ ID Labs Recent Labs  Lab 03/28/21 0842 03/31/21 2353 04/02/21 0022 04/02/21 0138 04/02/21 0554 04/03/21 0238  WBC 7.4 8.5  --  8.5  --  8.8  PLT 291 283  --  265  --  388  CREATININE 1.44* 1.90* 2.46*  --  1.89* 1.62*   Cardiac Enzymes No results for input(s): CKMB, TROPONINI, MYOGLOBIN in the last 168 hours.  Invalid input(s): CK  Radiology Reports CT ABDOMEN PELVIS W CONTRAST  Result Date: 04/02/2021 CLINICAL DATA:  Nausea and vomiting with epigastric pain and history of pancreatitis EXAM: CT ABDOMEN AND PELVIS WITH CONTRAST TECHNIQUE: Multidetector CT imaging of the abdomen and pelvis was performed using the standard protocol following bolus administration of intravenous contrast. CONTRAST:  70mL OMNIPAQUE IOHEXOL 300 MG/ML  SOLN COMPARISON:  03/22/2021 FINDINGS: Lower chest: No acute abnormality. Hepatobiliary: No focal liver abnormality is seen. No gallstones, gallbladder wall thickening, or biliary dilatation. Pancreas: Pancreas is well visualized. Some minimal peripancreatic inflammatory changes are noted adjacent to the head of the pancreas which may represent some very early mild pancreatitis. Spleen: Normal in size without focal abnormality. Adrenals/Urinary Tract: Adrenal glands are within normal limits. Kidneys are well visualize without renal calculi or obstructive changes. Ureters are within limits. Bladder is partially distended. Stomach/Bowel: No obstructive or inflammatory changes of colon noted. The appendix is within normal limits. No inflammatory changes are seen. Small bowel and stomach are within normal limits. Vascular/Lymphatic: Aortic atherosclerosis. No  enlarged abdominal or pelvic lymph nodes. Reproductive: Prostate is unremarkable. Other: No abdominal wall hernia or abnormality. No abdominopelvic ascites. Musculoskeletal: No acute or significant osseous findings. IMPRESSION: Changes suggestive of minimal pancreatitis in the region of the pancreatic head. Correlate with laboratory values. No other focal abnormality is noted. Electronically Signed   By: 72m M.D.   On: 04/02/2021 01:29

## 2021-04-04 ENCOUNTER — Telehealth: Payer: Self-pay

## 2021-04-04 ENCOUNTER — Other Ambulatory Visit: Payer: Self-pay

## 2021-04-04 ENCOUNTER — Other Ambulatory Visit (HOSPITAL_COMMUNITY): Payer: Self-pay

## 2021-04-04 LAB — CANCER ANTIGEN 19-9: CA 19-9: <2 U/mL (ref 0–35)

## 2021-04-04 LAB — CBC WITH DIFFERENTIAL/PLATELET
Abs Immature Granulocytes: 0.01 10*3/uL (ref 0.00–0.07)
Basophils Absolute: 0.1 10*3/uL (ref 0.0–0.1)
Basophils Relative: 1 %
Eosinophils Absolute: 0.1 10*3/uL (ref 0.0–0.5)
Eosinophils Relative: 1 %
HCT: 28.5 % — ABNORMAL LOW (ref 39.0–52.0)
Hemoglobin: 9.2 g/dL — ABNORMAL LOW (ref 13.0–17.0)
Immature Granulocytes: 0 %
Lymphocytes Relative: 43 %
Lymphs Abs: 3.1 10*3/uL (ref 0.7–4.0)
MCH: 27.5 pg (ref 26.0–34.0)
MCHC: 32.3 g/dL (ref 30.0–36.0)
MCV: 85.1 fL (ref 80.0–100.0)
Monocytes Absolute: 0.7 10*3/uL (ref 0.1–1.0)
Monocytes Relative: 9 %
Neutro Abs: 3.3 10*3/uL (ref 1.7–7.7)
Neutrophils Relative %: 46 %
Platelets: 274 10*3/uL (ref 150–400)
RBC: 3.35 MIL/uL — ABNORMAL LOW (ref 4.22–5.81)
RDW: 12.2 % (ref 11.5–15.5)
WBC: 7.2 10*3/uL (ref 4.0–10.5)
nRBC: 0 % (ref 0.0–0.2)

## 2021-04-04 LAB — COMPREHENSIVE METABOLIC PANEL
ALT: 13 U/L (ref 0–44)
AST: 20 U/L (ref 15–41)
Albumin: 2.7 g/dL — ABNORMAL LOW (ref 3.5–5.0)
Alkaline Phosphatase: 70 U/L (ref 38–126)
Anion gap: 8 (ref 5–15)
BUN: 12 mg/dL (ref 6–20)
CO2: 23 mmol/L (ref 22–32)
Calcium: 8.3 mg/dL — ABNORMAL LOW (ref 8.9–10.3)
Chloride: 106 mmol/L (ref 98–111)
Creatinine, Ser: 1.38 mg/dL — ABNORMAL HIGH (ref 0.61–1.24)
GFR, Estimated: >60 mL/min (ref >60)
Glucose, Bld: 111 mg/dL — ABNORMAL HIGH (ref 70–99)
Potassium: 3.7 mmol/L (ref 3.5–5.1)
Sodium: 137 mmol/L (ref 135–145)
Total Bilirubin: 0.8 mg/dL (ref 0.3–1.2)
Total Protein: 6.1 g/dL — ABNORMAL LOW (ref 6.5–8.1)

## 2021-04-04 LAB — MAGNESIUM: Magnesium: 1.9 mg/dL (ref 1.7–2.4)

## 2021-04-04 LAB — LIPASE, BLOOD: Lipase: 49 U/L (ref 11–51)

## 2021-04-04 LAB — CEA: CEA: 3.6 ng/mL (ref 0.0–4.7)

## 2021-04-04 LAB — GLUCOSE, CAPILLARY: Glucose-Capillary: 117 mg/dL — ABNORMAL HIGH (ref 70–99)

## 2021-04-04 LAB — IGG 4: IgG, Subclass 4: 66 mg/dL (ref 2–96)

## 2021-04-04 MED ORDER — HYDROCODONE-ACETAMINOPHEN 5-325 MG PO TABS
1.0000 | ORAL_TABLET | Freq: Four times a day (QID) | ORAL | 0 refills | Status: DC | PRN
  Filled 2021-04-04: qty 10, 3d supply, fill #0

## 2021-04-04 MED ORDER — ONDANSETRON 4 MG PO TBDP
4.0000 mg | ORAL_TABLET | Freq: Three times a day (TID) | ORAL | 0 refills | Status: DC | PRN
  Filled 2021-04-04: qty 20, 7d supply, fill #0

## 2021-04-04 MED ORDER — BACITRACIN-NEOMYCIN-POLYMYXIN 400-5-5000 EX OINT
1.0000 "application " | TOPICAL_OINTMENT | Freq: Three times a day (TID) | CUTANEOUS | 0 refills | Status: DC
  Filled 2021-04-04: qty 28.4, 10d supply, fill #0

## 2021-04-04 MED ORDER — OMEPRAZOLE 40 MG PO CPDR
40.0000 mg | DELAYED_RELEASE_CAPSULE | Freq: Every day | ORAL | 0 refills | Status: DC
  Filled 2021-04-04: qty 30, 30d supply, fill #0

## 2021-04-04 NOTE — Discharge Summary (Signed)
[place]. TZG:[id number] DOB: 1980-11-01 DOA: 04/01/2021  PCP: Camillia Herter, NP  Admit date: 04/01/2021  Discharge date: 04/04/2021  Admitted From: Home   Disposition:  Home   Recommendations for Outpatient Follow-up:   Follow up with PCP in 1-2 weeks  PCP Please obtain BMP/CBC, 2 view CXR in 1week,  (see Discharge instructions)   PCP Please follow up on the following pending results: Needs close outpatient GI follow-up for recurrent pancreatitis work-up.   Home Health: None   Equipment/Devices: None  Consultations: GI Discharge Condition: Stable    CODE STATUS: Full    Diet Recommendation: Soft low-fat diet, avoid fried foods or high calorie foods the next 1 week then gradually advance to regular consistency heart healthy low carbohydrate diet as tolerated.     Chief Complaint  Patient presents with   Abdominal Pain     Brief history of present illness from the day of admission and additional interim summary    Randy R Montrice Gracey. is a 40 y.o. male with medical history significant of hypertension, hyperlipidemia, type 2 diabetes, GERD, marijuana abuse, admitted for pancreatitis in November 2021 and again in May 2022 (he left AMA during this hospitalization).  He now presents with reoccurrence of epigastric abdominal pain which is sharp in nature radiating to his back, CT suggestive of acute pancreatitis, he was admitted for further treatment.                                                                 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  No history of alcohol abuse, stable LFTs and triglyceride levels, is on statin which is on hold, lipase is high and CT is suggestive of acute pancreatitis.  With supportive care he is back to his baseline and  pain-free tolerating clear liquid diet advance to soft, all numbers are stable including lipase, LFT, IgG4, CEA and CEA 19.9, case discussed with Dr. Tarri Glenn [facility] for discharge with outpatient GI follow-up.  Patient currently symptom-free.   2.  AKI on CKD 3A (creat 1.4) .  FeNA is still over 1, treated with IV fluids.  CT scan does not show any ureteric obstruction or stones.  Renal function back to baseline.   3.  Essential hypertension.  Blood pressure medications continue per home regimen follow with PCP.  4.  Dyslipidemia.  Holding statin due to pancreatitis.  PCP to follow and monitor.   5.  DM type II.  New home regimen unchanged.  He has been not taking Glucotrol for months.  Lab Results  Component Value Date   HGBA1C 7.7 (H) 04/02/2021     Discharge diagnosis     Principal Problem:   Acute pancreatitis Active Problems:   Hypertensive  urgency   Hyperlipidemia   Type 2 diabetes mellitus without complication, with long-term current use of insulin (HCC)   Nausea & vomiting    Discharge instructions    Discharge Instructions     Diet - low sodium heart healthy   Complete by: As directed    Discharge instructions   Complete by: As directed    Follow with Primary MD Camillia Herter, NP in 7 days   Get CBC, CMP, 2 view Chest X ray -  checked next visit within 1 week by Primary MD    Activity: As tolerated with Full fall precautions use walker/cane & assistance as needed  Disposition Home   Diet: Soft low-fat diet, avoid fried foods or high calorie foods the next 1 week then gradually advance to regular consistency heart healthy low carbohydrate diet as tolerated.  Accuchecks 4 times/day, Once in AM empty stomach and then before each meal. Log in all results and show them to your Prim.MD in 3 days. If any glucose reading is under 80 or above 300 call your Prim MD immidiately. Follow Low glucose instructions for glucose under 80 as instructed.   Special  Instructions: If you have smoked or chewed Tobacco  in the last 2 yrs please stop smoking, stop any regular Alcohol  and or any Recreational drug use.  On your next visit with your primary care physician please Get Medicines reviewed and adjusted.  Please request your Prim.MD to go over all Hospital Tests and Procedure/Radiological results at the follow up, please get all Hospital records sent to your Prim MD by signing hospital release before you go home.  If you experience worsening of your admission symptoms, develop shortness of breath, life threatening emergency, suicidal or homicidal thoughts you must seek medical attention immediately by calling 911 or calling your MD immediately  if symptoms less severe.  You Must read complete instructions/literature along with all the possible adverse reactions/side effects for all the Medicines you take and that have been prescribed to you. Take any new Medicines after you have completely understood and accpet all the possible adverse reactions/side effects.   Increase activity slowly   Complete by: As directed        Discharge Medications   Allergies as of 04/04/2021       Reactions   Tomato    unknown        Medication List     STOP taking these medications    atorvastatin 80 MG tablet Commonly known as: LIPITOR   glipiZIDE 10 MG tablet Commonly known as: Glucotrol   ibuprofen 200 MG tablet Commonly known as: ADVIL       TAKE these medications    acetaminophen 500 MG tablet Commonly known as: TYLENOL Take 1,000 mg by mouth every 6 (six) hours as needed for mild pain.   amLODipine 10 MG tablet Commonly known as: NORVASC Take 1 tablet (10 mg total) by mouth daily.   carvedilol 25 MG tablet Commonly known as: COREG Take 1 tablet (25 mg total) by mouth 2 (two) times daily with a meal.   HYDROcodone-acetaminophen 5-325 MG tablet Commonly known as: NORCO/VICODIN Take 1 tablet by mouth every 6 (six) hours as needed.    Lantus SoloStar 100 UNIT/ML Solostar Pen Generic drug: insulin glargine Inject 30 Units into the skin daily.   lisinopril 40 MG tablet Commonly known as: ZESTRIL Take 1 tablet (40 mg total) by mouth daily.   neomycin-bacitracin-polymyxin Oint Commonly known as: NEOSPORIN Apply 1  application topically 3 (three) times daily.   omeprazole 40 MG capsule Commonly known as: PRILOSEC Take 1 capsule (40 mg total) by mouth daily.   ondansetron 4 MG disintegrating tablet Commonly known as: Zofran ODT Take 1 tablet (4 mg total) by mouth every 8 (eight) hours as needed for nausea or vomiting.   Pen Needles 31G X 8 MM Misc USE AS DIRECTED   sildenafil 100 MG tablet Commonly known as: VIAGRA TAKE 0.5-1 TABLETS (50-100 MG TOTAL) BY MOUTH DAILY AS NEEDED FOR ERECTILE DYSFUNCTION.   True Metrix Blood Glucose Test test strip Generic drug: glucose blood Use as instructed   True Metrix Meter w/Device Kit Use as directed   TRUEplus Lancets 28G Misc Use as directed         Follow-up Information     Camillia Herter, NP. Schedule an appointment as soon as possible for a visit in 1 week(s).   Specialty: Nurse Practitioner Contact information: [place] [address] [phone]         Thornton Park, MD. Schedule an appointment as soon as possible for a visit in 1 week(s).   Specialty: Gastroenterology Contact information: [place] [address] [phone]                 Major procedures and Radiology Reports - PLEASE review detailed and final reports thoroughly  -       CT ABDOMEN PELVIS W CONTRAST  Result Date: 04/02/2021 CLINICAL DATA:  Nausea and vomiting with epigastric pain and history of pancreatitis EXAM: CT ABDOMEN AND PELVIS WITH CONTRAST TECHNIQUE: Multidetector CT imaging of the abdomen and pelvis was performed using the standard protocol following bolus administration of intravenous contrast. CONTRAST:   85m OMNIPAQUE IOHEXOL 300 MG/ML  SOLN COMPARISON:  03/22/2021 FINDINGS: Lower chest: No acute abnormality. Hepatobiliary: No focal liver abnormality is seen. No gallstones, gallbladder wall thickening, or biliary dilatation. Pancreas: Pancreas is well visualized. Some minimal peripancreatic inflammatory changes are noted adjacent to the head of the pancreas which may represent some very early mild pancreatitis. Spleen: Normal in size without focal abnormality. Adrenals/Urinary Tract: Adrenal glands are within normal limits. Kidneys are well visualize without renal calculi or obstructive changes. Ureters are within limits. Bladder is partially distended. Stomach/Bowel: No obstructive or inflammatory changes of colon noted. The appendix is within normal limits. No inflammatory changes are seen. Small bowel and stomach are within normal limits. Vascular/Lymphatic: Aortic atherosclerosis. No enlarged abdominal or pelvic lymph nodes. Reproductive: Prostate is unremarkable. Other: No abdominal wall hernia or abnormality. No abdominopelvic ascites. Musculoskeletal: No acute or significant osseous findings. IMPRESSION: Changes suggestive of minimal pancreatitis in the region of the pancreatic head. Correlate with laboratory values. No other focal abnormality is noted. Electronically Signed   By: MInez CatalinaM.D.   On: 04/02/2021 01:29   CT Abdomen Pelvis W Contrast  Result Date: 03/22/2021 CLINICAL DATA:  Nausea and vomiting.  Epigastric pain EXAM: CT ABDOMEN AND PELVIS WITH CONTRAST TECHNIQUE: Multidetector CT imaging of the abdomen and pelvis was performed using the standard protocol following bolus administration of intravenous contrast. CONTRAST:  1062mOMNIPAQUE IOHEXOL 300 MG/ML  SOLN COMPARISON:  CT abdomen pelvis 09/17/2020 FINDINGS: Lower chest: No acute abnormality. Hepatobiliary: No focal liver abnormality. No gallstones, gallbladder wall thickening, or pericholecystic fluid. No biliary dilatation.  Pancreas: No focal lesion. Normal pancreatic contour. No surrounding inflammatory changes. No main pancreatic ductal dilatation. Spleen: Normal in size without focal abnormality. Adrenals/Urinary Tract: No  adrenal nodule bilaterally. Bilateral kidneys enhance symmetrically. No hydronephrosis. No hydroureter. The urinary bladder is unremarkable. Stomach/Bowel: Stomach is within normal limits. No evidence of bowel wall thickening or dilatation. Appendix appears normal. Vascular/Lymphatic: No abdominal aorta or iliac aneurysm. Mild atherosclerotic plaque of the aorta and its branches. No abdominal, pelvic, or inguinal lymphadenopathy. Reproductive: Prostate is unremarkable. Other: No intraperitoneal free fluid. No intraperitoneal free gas. No organized fluid collection. Musculoskeletal: No abdominal wall hernia or abnormality. No suspicious lytic or blastic osseous lesions. No acute displaced fracture. Multilevel degenerative changes of the spine. IMPRESSION: 1. No acute intra-abdominal or intrapelvic abnormality. 2.  Aortic Atherosclerosis (ICD10-I70.0). Electronically Signed   By: Iven Finn M.D.   On: 03/22/2021 21:32       Today   Subjective    Randy [name] today has no headache,no chest abdominal pain,no new weakness tingling or numbness, feels much better wants to go home today.     Objective   Blood pressure (!) 165/80, pulse 99, temperature 98.5 F (36.9 C), resp. rate 18, height _0  (1.702 m), weight 86.4 kg, SpO2 96 %.   Intake/Output Summary (Last 24 hours) at 04/04/2021 0846 Last data filed at 04/04/2021 0348 Gross per 24 hour  Intake 2266.51 ml  Output 850 ml  Net 1416.51 ml    Exam  Awake Alert, No new F.N deficits, Normal affect [place].AT,PERRAL Supple Neck,No JVD, No cervical lymphadenopathy appriciated.  Symmetrical Chest wall movement, Good air movement bilaterally, CTAB RRR,No Gallops,Rubs or new Murmurs, No Parasternal Heave +ve B.Sounds, Abd Soft, Non tender, No  organomegaly appriciated, No rebound -guarding or rigidity. No Cyanosis, Clubbing or edema, No new Rash or bruise   Data Review   CBC w Diff:  Lab Results  Component Value Date   WBC 7.2 04/04/2021   HGB 9.2 (L) 04/04/2021   HCT 28.5 (L) 04/04/2021   HCT 38.7 08/27/2019   PLT 274 04/04/2021   LYMPHOPCT 43 04/04/2021   MONOPCT 9 04/04/2021   EOSPCT 1 04/04/2021   BASOPCT 1 04/04/2021    CMP:  Lab Results  Component Value Date   NA 137 04/04/2021   NA 133 (L) 12/07/2019   K 3.7 04/04/2021   CL 106 04/04/2021   CO2 23 04/04/2021   BUN 12 04/04/2021   BUN 18 12/07/2019   CREATININE 1.38 (H) 04/04/2021   PROT 6.1 (L) 04/04/2021   PROT 6.1 09/01/2019   ALBUMIN 2.7 (L) 04/04/2021   ALBUMIN 3.4 (L) 09/01/2019   BILITOT 0.8 04/04/2021   BILITOT 0.3 09/01/2019   ALKPHOS 70 04/04/2021   AST 20 04/04/2021   ALT 13 04/04/2021  .   Total Time in preparing paper work, data evaluation and todays exam - 59 minutes  Lala Lund M.D on 04/04/2021 at 8:46 AM  [facility]

## 2021-04-04 NOTE — Telephone Encounter (Signed)
-----   Message from Tressia Danas, MD sent at 04/04/2021  8:31 AM EST ----- Patient needs hospital follow-up with an APP in 2-3 weeks. He is going home today.  Thanks.  KLB

## 2021-04-04 NOTE — TOC Transition Note (Signed)
Transition of Care [facility]) - CM/SW Discharge Note   Patient Details  Name: Melvern Ramone. MRN: [id number] Date of Birth: 05-27-1980  Transition of Care [facility]) CM/SW Contact:  Tom-Johnson, Hershal Coria, RN Phone Number: 04/04/2021, 11:17 AM   Clinical Narrative:    CM consulted for transportation for patient at discharge. [employer] called and cab voucher given to patient. No further TOC needs noted.   Final next level of care: Home/Self Care Barriers to Discharge: Barriers Resolved   Patient Goals and CMS Choice Patient states their goals for this hospitalization and ongoing recovery are:: To go home CMS Medicare.gov Compare Post Acute Care list provided to:: Patient Choice offered to / list presented to : Patient  Discharge Placement                       Discharge Plan and Services                DME Arranged: N/A DME Agency: NA       HH Arranged: NA HH Agency: NA        Social Determinants of Health (SDOH) Interventions     Readmission Risk Interventions No flowsheet data found.

## 2021-04-04 NOTE — Discharge Instructions (Addendum)
Follow with Primary MD Rema Fendt, NP in 7 days   Get CBC, CMP, 2 view Chest X ray -  checked next visit within 1 week by Primary MD    Activity: As tolerated with Full fall precautions use walker/cane & assistance as needed  Disposition Home   Diet: Soft low-fat diet, avoid fried foods or high calorie foods the next 1 week then gradually advance to regular consistency heart healthy low carbohydrate diet as tolerated.  Accuchecks 4 times/day, Once in AM empty stomach and then before each meal. Log in all results and show them to your Prim.MD in 3 days. If any glucose reading is under 80 or above 300 call your Prim MD immidiately. Follow Low glucose instructions for glucose under 80 as instructed.  Special Instructions: If you have smoked or chewed Tobacco  in the last 2 yrs please stop smoking, stop any regular Alcohol  and or any Recreational drug use.  On your next visit with your primary care physician please Get Medicines reviewed and adjusted.  Please request your Prim.MD to go over all Hospital Tests and Procedure/Radiological results at the follow up, please get all Hospital records sent to your Prim MD by signing hospital release before you go home.  If you experience worsening of your admission symptoms, develop shortness of breath, life threatening emergency, suicidal or homicidal thoughts you must seek medical attention immediately by calling 911 or calling your MD immediately  if symptoms less severe.  You Must read complete instructions/literature along with all the possible adverse reactions/side effects for all the Medicines you take and that have been prescribed to you. Take any new Medicines after you have completely understood and accpet all the possible adverse reactions/side effects.

## 2021-04-04 NOTE — Telephone Encounter (Signed)
Per Dr. Orvan Falconer request, pt has been scheduled for hosp f/u on 04/18/21 @ 2pm. Appt reminder has been mailed. Appt will reflect on AVS upon hosp discharge for pt future reference.

## 2021-04-09 ENCOUNTER — Telehealth: Payer: Self-pay

## 2021-04-09 ENCOUNTER — Other Ambulatory Visit: Payer: Self-pay

## 2021-04-09 ENCOUNTER — Other Ambulatory Visit: Payer: Self-pay | Admitting: Family

## 2021-04-09 ENCOUNTER — Other Ambulatory Visit: Payer: Self-pay | Admitting: Family Medicine

## 2021-04-09 DIAGNOSIS — E119 Type 2 diabetes mellitus without complications: Secondary | ICD-10-CM

## 2021-04-09 DIAGNOSIS — I10 Essential (primary) hypertension: Secondary | ICD-10-CM

## 2021-04-09 MED ORDER — LISINOPRIL 40 MG PO TABS
40.0000 mg | ORAL_TABLET | Freq: Every day | ORAL | 0 refills | Status: DC
  Filled 2021-04-09: qty 30, 30d supply, fill #0

## 2021-04-09 MED ORDER — CARVEDILOL 25 MG PO TABS
25.0000 mg | ORAL_TABLET | Freq: Two times a day (BID) | ORAL | 0 refills | Status: DC
  Filled 2021-04-09 – 2021-05-23 (×4): qty 60, 30d supply, fill #0

## 2021-04-09 MED ORDER — AMLODIPINE BESYLATE 10 MG PO TABS
10.0000 mg | ORAL_TABLET | Freq: Every day | ORAL | 0 refills | Status: DC
  Filled 2021-04-09 – 2021-05-23 (×3): qty 30, 30d supply, fill #0

## 2021-04-09 NOTE — Telephone Encounter (Signed)
Transition Care Management Unsuccessful Follow-up Telephone Call  Date of discharge and from where:  04/04/2021, [facility]  Attempts:  1st Attempt  Reason for unsuccessful TCM follow-up call:  Left voice message on # [phone]. Call back requested to this CM.  Need to discuss scheduling a hospital follow up appointment.

## 2021-04-10 ENCOUNTER — Telehealth: Payer: Self-pay

## 2021-04-10 NOTE — Telephone Encounter (Signed)
Transition Care Management Unsuccessful Follow-up Telephone Call  Date of discharge and from where:  04/04/2021, [facility]   Attempts:  2nd Attempt  Reason for unsuccessful TCM follow-up call:  Left voice message on # [phone]. Call back requested.   Need to discuss scheduling a hospital follow up appointment

## 2021-04-11 ENCOUNTER — Telehealth: Payer: Self-pay

## 2021-04-11 NOTE — Telephone Encounter (Signed)
Transition Care Management Unsuccessful Follow-up Telephone Call  Date of discharge and from where:  04/04/2021, [facility]   Attempts:  3rd Attempt  Reason for unsuccessful TCM follow-up call:  Left voice message on # [phone]. Call back requested.   Letter sent to patient requesting he contact Primary Care at [facility] to schedule a follow up appointment as we have not been able to reach him.

## 2021-04-13 ENCOUNTER — Other Ambulatory Visit: Payer: Self-pay

## 2021-04-16 ENCOUNTER — Other Ambulatory Visit: Payer: Self-pay

## 2021-04-18 ENCOUNTER — Other Ambulatory Visit (INDEPENDENT_AMBULATORY_CARE_PROVIDER_SITE_OTHER): Payer: Self-pay

## 2021-04-18 ENCOUNTER — Other Ambulatory Visit: Payer: Self-pay

## 2021-04-18 ENCOUNTER — Telehealth: Payer: Self-pay | Admitting: Physician Assistant

## 2021-04-18 ENCOUNTER — Ambulatory Visit (INDEPENDENT_AMBULATORY_CARE_PROVIDER_SITE_OTHER): Payer: Self-pay | Admitting: Physician Assistant

## 2021-04-18 ENCOUNTER — Encounter: Payer: Self-pay | Admitting: Physician Assistant

## 2021-04-18 VITALS — BP 148/80 | HR 78 | Ht 68.0 in | Wt 199.5 lb

## 2021-04-18 DIAGNOSIS — K859 Acute pancreatitis without necrosis or infection, unspecified: Secondary | ICD-10-CM

## 2021-04-18 DIAGNOSIS — R110 Nausea: Secondary | ICD-10-CM

## 2021-04-18 LAB — CBC WITH DIFFERENTIAL/PLATELET
Basophils Absolute: 0 10*3/uL (ref 0.0–0.1)
Basophils Relative: 0.4 % (ref 0.0–3.0)
Eosinophils Absolute: 0.1 10*3/uL (ref 0.0–0.7)
Eosinophils Relative: 2 % (ref 0.0–5.0)
HCT: 31.4 % — ABNORMAL LOW (ref 39.0–52.0)
Hemoglobin: 10.5 g/dL — ABNORMAL LOW (ref 13.0–17.0)
Lymphocytes Relative: 39 % (ref 12.0–46.0)
Lymphs Abs: 2.9 10*3/uL (ref 0.7–4.0)
MCHC: 33.3 g/dL (ref 30.0–36.0)
MCV: 82.6 fl (ref 78.0–100.0)
Monocytes Absolute: 0.6 10*3/uL (ref 0.1–1.0)
Monocytes Relative: 7.7 % (ref 3.0–12.0)
Neutro Abs: 3.8 10*3/uL (ref 1.4–7.7)
Neutrophils Relative %: 50.9 % (ref 43.0–77.0)
Platelets: 357 10*3/uL (ref 150.0–400.0)
RBC: 3.8 Mil/uL — ABNORMAL LOW (ref 4.22–5.81)
RDW: 13.2 % (ref 11.5–15.5)
WBC: 7.5 10*3/uL (ref 4.0–10.5)

## 2021-04-18 LAB — LIPASE: Lipase: 44 U/L (ref 11.0–59.0)

## 2021-04-18 LAB — COMPREHENSIVE METABOLIC PANEL
ALT: 16 U/L (ref 0–53)
AST: 16 U/L (ref 0–37)
Albumin: 3.5 g/dL (ref 3.5–5.2)
Alkaline Phosphatase: 99 U/L (ref 39–117)
BUN: 16 mg/dL (ref 6–23)
CO2: 29 mEq/L (ref 19–32)
Calcium: 8.9 mg/dL (ref 8.4–10.5)
Chloride: 99 mEq/L (ref 96–112)
Creatinine, Ser: 1.27 mg/dL (ref 0.40–1.50)
GFR: 70 mL/min (ref >60.00)
Glucose, Bld: 191 mg/dL — ABNORMAL HIGH (ref 70–99)
Potassium: 3.8 mEq/L (ref 3.5–5.1)
Sodium: 132 mEq/L — ABNORMAL LOW (ref 135–145)
Total Bilirubin: 0.3 mg/dL (ref 0.2–1.2)
Total Protein: 7.5 g/dL (ref 6.0–8.3)

## 2021-04-18 MED ORDER — ONDANSETRON HCL 4 MG PO TABS
4.0000 mg | ORAL_TABLET | Freq: Four times a day (QID) | ORAL | 1 refills | Status: DC | PRN
  Filled 2021-04-18 – 2021-05-23 (×2): qty 30, 8d supply, fill #0

## 2021-04-18 MED ORDER — HYDROCODONE-ACETAMINOPHEN 5-325 MG PO TABS
1.0000 | ORAL_TABLET | Freq: Four times a day (QID) | ORAL | Status: DC | PRN
Start: 2021-04-18 — End: 2021-04-19

## 2021-04-18 NOTE — Patient Instructions (Addendum)
If you are age 41 or younger, your body mass index should be between 19-25. Your Body mass index is 30.33 kg/m. If this is out of the aformentioned range listed, please consider follow up with your Primary Care Provider.   The [place] [facility] providers would like to encourage you to use [facility] to communicate with providers for non-urgent requests or questions.  Due to long hold times on the telephone, sending your provider a message by [facility] may be a faster and more efficient way to get a response.  Please allow 48 business hours for a response.  Please remember that this is for non-urgent requests.   STOP Lisinopril  START Ondansetron 4 mg 1 tablet every 6 hours as needed for nausea. START Hydrocodone/Tylenol 5/325 mg 1 tablet every 6 hours as needed for pain  Talk to your Primary care about your blood pressure since we will be stopping one of your medications.   Try using Miralax 1 capful in 8 ounces of water or juice daily as needed for constipation.  You have been scheduled to follow up with Mike Gip, PA-C on May 23, 2021 at 2:00 pm  Thank you for entrusting me with your care and choosing [facility].  Amy Esterwood, PA-C

## 2021-04-18 NOTE — Telephone Encounter (Signed)
Don't forget to send this in.

## 2021-04-18 NOTE — Telephone Encounter (Signed)
Inbound call from patient requesting hydrocodone be sent to [facility] [address] [facility] [address]
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
## 2021-04-19 ENCOUNTER — Other Ambulatory Visit: Payer: Self-pay | Admitting: Nurse Practitioner

## 2021-04-19 MED ORDER — HYDROCODONE-ACETAMINOPHEN 5-325 MG PO TABS: 1.0000 | ORAL_TABLET | Freq: Four times a day (QID) | ORAL | 0 refills | Status: DC | PRN

## 2021-04-19 NOTE — Telephone Encounter (Signed)
Reviewed patient's chart, Vicodin was sent as needing to administered in office. I had Alcide Evener to resend to his pharmacy. She has reviewed his chart and the prescription. She sent in corrected prescription with #15. I have left a voicemail to let the patient know that we have resent it in for him.

## 2021-04-19 NOTE — Telephone Encounter (Signed)
Patient called stating he went to [facility] to pick up this medication and was told they didn't have an order for it.  Thank you.

## 2021-04-23 ENCOUNTER — Other Ambulatory Visit: Payer: Self-pay

## 2021-04-23 DIAGNOSIS — K859 Acute pancreatitis without necrosis or infection, unspecified: Secondary | ICD-10-CM

## 2021-04-23 DIAGNOSIS — D509 Iron deficiency anemia, unspecified: Secondary | ICD-10-CM

## 2021-05-09 ENCOUNTER — Other Ambulatory Visit: Payer: Self-pay

## 2021-05-10 ENCOUNTER — Other Ambulatory Visit: Payer: Self-pay

## 2021-05-11 ENCOUNTER — Other Ambulatory Visit: Payer: Self-pay

## 2021-05-14 ENCOUNTER — Other Ambulatory Visit: Payer: Self-pay

## 2021-05-23 ENCOUNTER — Other Ambulatory Visit (HOSPITAL_COMMUNITY): Payer: Self-pay

## 2021-05-23 ENCOUNTER — Other Ambulatory Visit: Payer: Self-pay

## 2021-05-23 ENCOUNTER — Ambulatory Visit: Payer: Self-pay | Admitting: Physician Assistant

## 2021-05-23 ENCOUNTER — Other Ambulatory Visit: Payer: Self-pay | Admitting: Family

## 2021-05-23 MED ORDER — OMEPRAZOLE 40 MG PO CPDR
40.0000 mg | DELAYED_RELEASE_CAPSULE | Freq: Every day | ORAL | 0 refills | Status: DC
  Filled 2021-05-23: qty 30, 30d supply, fill #0

## 2021-06-05 ENCOUNTER — Other Ambulatory Visit: Payer: Self-pay

## 2021-06-07 ENCOUNTER — Ambulatory Visit: Payer: Self-pay | Admitting: Physician Assistant

## 2021-06-11 ENCOUNTER — Other Ambulatory Visit: Payer: Self-pay | Admitting: Family

## 2021-06-11 ENCOUNTER — Other Ambulatory Visit: Payer: Self-pay

## 2021-06-12 ENCOUNTER — Other Ambulatory Visit: Payer: Self-pay

## 2021-06-18 ENCOUNTER — Inpatient Hospital Stay (HOSPITAL_COMMUNITY)
Admission: EM | Admit: 2021-06-18 | Discharge: 2021-06-21 | DRG: 305 | Disposition: A | Discharge status: Home / Self Care | Payer: Self-pay | Attending: Internal Medicine | Admitting: Internal Medicine

## 2021-06-18 ENCOUNTER — Encounter (HOSPITAL_COMMUNITY): Payer: Self-pay | Admitting: Internal Medicine

## 2021-06-18 ENCOUNTER — Other Ambulatory Visit: Payer: Self-pay

## 2021-06-18 ENCOUNTER — Emergency Department (HOSPITAL_COMMUNITY): Payer: Self-pay

## 2021-06-18 DIAGNOSIS — E1165 Type 2 diabetes mellitus with hyperglycemia: Secondary | ICD-10-CM | POA: Diagnosis present

## 2021-06-18 DIAGNOSIS — Z765 Malingerer [conscious simulation]: Secondary | ICD-10-CM

## 2021-06-18 DIAGNOSIS — I161 Hypertensive emergency: Principal | ICD-10-CM | POA: Diagnosis present

## 2021-06-18 DIAGNOSIS — E785 Hyperlipidemia, unspecified: Secondary | ICD-10-CM | POA: Diagnosis present

## 2021-06-18 DIAGNOSIS — Y92019 Unspecified place in single-family (private) house as the place of occurrence of the external cause: Secondary | ICD-10-CM

## 2021-06-18 DIAGNOSIS — K861 Other chronic pancreatitis: Secondary | ICD-10-CM | POA: Diagnosis present

## 2021-06-18 DIAGNOSIS — S81812A Laceration without foreign body, left lower leg, initial encounter: Secondary | ICD-10-CM | POA: Diagnosis present

## 2021-06-18 DIAGNOSIS — E119 Type 2 diabetes mellitus without complications: Secondary | ICD-10-CM

## 2021-06-18 DIAGNOSIS — F1210 Cannabis abuse, uncomplicated: Secondary | ICD-10-CM | POA: Diagnosis present

## 2021-06-18 DIAGNOSIS — R112 Nausea with vomiting, unspecified: Secondary | ICD-10-CM | POA: Diagnosis present

## 2021-06-18 DIAGNOSIS — E1169 Type 2 diabetes mellitus with other specified complication: Secondary | ICD-10-CM | POA: Diagnosis present

## 2021-06-18 DIAGNOSIS — Z833 Family history of diabetes mellitus: Secondary | ICD-10-CM

## 2021-06-18 DIAGNOSIS — Z91018 Allergy to other foods: Secondary | ICD-10-CM

## 2021-06-18 DIAGNOSIS — E871 Hypo-osmolality and hyponatremia: Secondary | ICD-10-CM | POA: Diagnosis present

## 2021-06-18 DIAGNOSIS — Z87891 Personal history of nicotine dependence: Secondary | ICD-10-CM

## 2021-06-18 DIAGNOSIS — K219 Gastro-esophageal reflux disease without esophagitis: Secondary | ICD-10-CM | POA: Diagnosis present

## 2021-06-18 DIAGNOSIS — Z794 Long term (current) use of insulin: Secondary | ICD-10-CM

## 2021-06-18 DIAGNOSIS — Z79899 Other long term (current) drug therapy: Secondary | ICD-10-CM

## 2021-06-18 DIAGNOSIS — Z23 Encounter for immunization: Secondary | ICD-10-CM

## 2021-06-18 DIAGNOSIS — I10 Essential (primary) hypertension: Secondary | ICD-10-CM | POA: Diagnosis present

## 2021-06-18 DIAGNOSIS — Z8249 Family history of ischemic heart disease and other diseases of the circulatory system: Secondary | ICD-10-CM

## 2021-06-18 DIAGNOSIS — K859 Acute pancreatitis without necrosis or infection, unspecified: Secondary | ICD-10-CM

## 2021-06-18 DIAGNOSIS — D649 Anemia, unspecified: Secondary | ICD-10-CM | POA: Diagnosis present

## 2021-06-18 DIAGNOSIS — I160 Hypertensive urgency: Secondary | ICD-10-CM

## 2021-06-18 DIAGNOSIS — W19XXXA Unspecified fall, initial encounter: Secondary | ICD-10-CM

## 2021-06-18 DIAGNOSIS — N179 Acute kidney failure, unspecified: Secondary | ICD-10-CM | POA: Diagnosis present

## 2021-06-18 DIAGNOSIS — Z20822 Contact with and (suspected) exposure to covid-19: Secondary | ICD-10-CM | POA: Diagnosis present

## 2021-06-18 DIAGNOSIS — Y92009 Unspecified place in unspecified non-institutional (private) residence as the place of occurrence of the external cause: Secondary | ICD-10-CM

## 2021-06-18 DIAGNOSIS — Z91199 Patient's noncompliance with other medical treatment and regimen due to unspecified reason: Secondary | ICD-10-CM

## 2021-06-18 DIAGNOSIS — W1830XA Fall on same level, unspecified, initial encounter: Secondary | ICD-10-CM | POA: Diagnosis present

## 2021-06-18 LAB — COMPREHENSIVE METABOLIC PANEL
ALT: 16 U/L (ref 0–44)
AST: 17 U/L (ref 15–41)
Albumin: 2.8 g/dL — ABNORMAL LOW (ref 3.5–5.0)
Alkaline Phosphatase: 123 U/L (ref 38–126)
Anion gap: 6 (ref 5–15)
BUN: 17 mg/dL (ref 6–20)
CO2: 24 mmol/L (ref 22–32)
Calcium: 8.1 mg/dL — ABNORMAL LOW (ref 8.9–10.3)
Chloride: 102 mmol/L (ref 98–111)
Creatinine, Ser: 1.55 mg/dL — ABNORMAL HIGH (ref 0.61–1.24)
GFR, Estimated: 57 mL/min — ABNORMAL LOW (ref >60)
Glucose, Bld: 328 mg/dL — ABNORMAL HIGH (ref 70–99)
Potassium: 4.2 mmol/L (ref 3.5–5.1)
Sodium: 132 mmol/L — ABNORMAL LOW (ref 135–145)
Total Bilirubin: 0.5 mg/dL (ref 0.3–1.2)
Total Protein: 6.2 g/dL — ABNORMAL LOW (ref 6.5–8.1)

## 2021-06-18 LAB — URINALYSIS, ROUTINE W REFLEX MICROSCOPIC
Bilirubin Urine: NEGATIVE
Glucose, UA: >=500 mg/dL — AB
Ketones, ur: NEGATIVE mg/dL
Leukocytes,Ua: NEGATIVE
Nitrite: NEGATIVE
Protein, ur: >300 mg/dL — AB
Specific Gravity, Urine: 1.015 (ref 1.005–1.030)
pH: 7 (ref 5.0–8.0)

## 2021-06-18 LAB — CBC WITH DIFFERENTIAL/PLATELET
Abs Immature Granulocytes: 0.02 10*3/uL (ref 0.00–0.07)
Basophils Absolute: 0.1 10*3/uL (ref 0.0–0.1)
Basophils Relative: 1 %
Eosinophils Absolute: 0.2 10*3/uL (ref 0.0–0.5)
Eosinophils Relative: 3 %
HCT: 29.9 % — ABNORMAL LOW (ref 39.0–52.0)
Hemoglobin: 10.2 g/dL — ABNORMAL LOW (ref 13.0–17.0)
Immature Granulocytes: 0 %
Lymphocytes Relative: 41 %
Lymphs Abs: 3.2 10*3/uL (ref 0.7–4.0)
MCH: 29 pg (ref 26.0–34.0)
MCHC: 34.1 g/dL (ref 30.0–36.0)
MCV: 84.9 fL (ref 80.0–100.0)
Monocytes Absolute: 0.5 10*3/uL (ref 0.1–1.0)
Monocytes Relative: 7 %
Neutro Abs: 3.7 10*3/uL (ref 1.7–7.7)
Neutrophils Relative %: 48 %
Platelets: 258 10*3/uL (ref 150–400)
RBC: 3.52 MIL/uL — ABNORMAL LOW (ref 4.22–5.81)
RDW: 12.2 % (ref 11.5–15.5)
WBC: 7.8 10*3/uL (ref 4.0–10.5)
nRBC: 0 % (ref 0.0–0.2)

## 2021-06-18 LAB — RAPID URINE DRUG SCREEN, HOSP PERFORMED
Amphetamines: NOT DETECTED
Barbiturates: NOT DETECTED
Benzodiazepines: NOT DETECTED
Cocaine: NOT DETECTED
Opiates: NOT DETECTED
Tetrahydrocannabinol: POSITIVE — AB

## 2021-06-18 LAB — URINALYSIS, MICROSCOPIC (REFLEX)

## 2021-06-18 LAB — CBG MONITORING, ED
Glucose-Capillary: 176 mg/dL — ABNORMAL HIGH (ref 70–99)
Glucose-Capillary: 311 mg/dL — ABNORMAL HIGH (ref 70–99)
Glucose-Capillary: 329 mg/dL — ABNORMAL HIGH (ref 70–99)
Glucose-Capillary: 436 mg/dL — ABNORMAL HIGH (ref 70–99)

## 2021-06-18 LAB — MRSA NEXT GEN BY PCR, NASAL: MRSA by PCR Next Gen: NOT DETECTED

## 2021-06-18 LAB — BRAIN NATRIURETIC PEPTIDE: B Natriuretic Peptide: 148.9 pg/mL — ABNORMAL HIGH (ref 0.0–100.0)

## 2021-06-18 LAB — GLUCOSE, CAPILLARY: Glucose-Capillary: 105 mg/dL — ABNORMAL HIGH (ref 70–99)

## 2021-06-18 LAB — TROPONIN I (HIGH SENSITIVITY)
Troponin I (High Sensitivity): 18 ng/L — ABNORMAL HIGH (ref <18)
Troponin I (High Sensitivity): 22 ng/L — ABNORMAL HIGH (ref <18)

## 2021-06-18 LAB — LIPASE, BLOOD: Lipase: 52 U/L — ABNORMAL HIGH (ref 11–51)

## 2021-06-18 MED ORDER — AMLODIPINE BESYLATE 5 MG PO TABS
10.0000 mg | ORAL_TABLET | Freq: Every day | ORAL | Status: DC
  Administered 2021-06-18: 10 mg via ORAL
  Filled 2021-06-18: qty 2

## 2021-06-18 MED ORDER — DROPERIDOL 2.5 MG/ML IJ SOLN
1.2500 mg | Freq: Once | INTRAMUSCULAR | Status: AC
  Administered 2021-06-18: 1.25 mg via INTRAVENOUS
  Filled 2021-06-18: qty 2

## 2021-06-18 MED ORDER — NICARDIPINE HCL IN NACL 20-0.86 MG/200ML-% IV SOLN
3.0000 mg/h | INTRAVENOUS | Status: DC
  Administered 2021-06-18: 5 mg/h via INTRAVENOUS
  Filled 2021-06-18: qty 200

## 2021-06-18 MED ORDER — MORPHINE SULFATE (PF) 4 MG/ML IV SOLN
4.0000 mg | Freq: Once | INTRAVENOUS | Status: AC
  Administered 2021-06-18: 4 mg via INTRAVENOUS
  Filled 2021-06-18: qty 1

## 2021-06-18 MED ORDER — ACETAMINOPHEN 325 MG PO TABS
650.0000 mg | ORAL_TABLET | Freq: Four times a day (QID) | ORAL | Status: DC | PRN
  Administered 2021-06-19 – 2021-06-20 (×3): 650 mg via ORAL
  Filled 2021-06-18 (×3): qty 2

## 2021-06-18 MED ORDER — IOHEXOL 300 MG/ML  SOLN
100.0000 mL | Freq: Once | INTRAMUSCULAR | Status: AC | PRN
  Administered 2021-06-18: 100 mL via INTRAVENOUS

## 2021-06-18 MED ORDER — ONDANSETRON HCL 4 MG/2ML IJ SOLN
4.0000 mg | Freq: Once | INTRAMUSCULAR | Status: AC
  Administered 2021-06-18: 4 mg via INTRAVENOUS
  Filled 2021-06-18: qty 2

## 2021-06-18 MED ORDER — ONDANSETRON HCL 4 MG PO TABS: 4.0000 mg | ORAL_TABLET | Freq: Four times a day (QID) | ORAL | Status: DC | PRN

## 2021-06-18 MED ORDER — HYDRALAZINE HCL 20 MG/ML IJ SOLN
10.0000 mg | INTRAMUSCULAR | Status: DC | PRN
  Administered 2021-06-18 – 2021-06-21 (×10): 10 mg via INTRAVENOUS
  Filled 2021-06-18 (×11): qty 1

## 2021-06-18 MED ORDER — ENOXAPARIN SODIUM 40 MG/0.4ML IJ SOSY
40.0000 mg | PREFILLED_SYRINGE | INTRAMUSCULAR | Status: DC
  Administered 2021-06-18 – 2021-06-21 (×4): 40 mg via SUBCUTANEOUS
  Filled 2021-06-18 (×4): qty 0.4

## 2021-06-18 MED ORDER — BISACODYL 5 MG PO TBEC: 5.0000 mg | DELAYED_RELEASE_TABLET | Freq: Every day | ORAL | Status: DC | PRN

## 2021-06-18 MED ORDER — INSULIN ASPART 100 UNIT/ML IJ SOLN
0.0000 [IU] | Freq: Three times a day (TID) | INTRAMUSCULAR | Status: DC
  Administered 2021-06-18: 11 [IU] via SUBCUTANEOUS
  Administered 2021-06-18: 3 [IU] via SUBCUTANEOUS
  Administered 2021-06-19: 5 [IU] via SUBCUTANEOUS
  Administered 2021-06-19: 11 [IU] via SUBCUTANEOUS

## 2021-06-18 MED ORDER — CARVEDILOL 25 MG PO TABS
25.0000 mg | ORAL_TABLET | Freq: Two times a day (BID) | ORAL | Status: DC
  Administered 2021-06-18 – 2021-06-21 (×8): 25 mg via ORAL
  Filled 2021-06-18: qty 1
  Filled 2021-06-18: qty 2
  Filled 2021-06-18 (×2): qty 1
  Filled 2021-06-18: qty 2
  Filled 2021-06-18: qty 1
  Filled 2021-06-18: qty 2
  Filled 2021-06-18: qty 1
  Filled 2021-06-18: qty 2

## 2021-06-18 MED ORDER — KETOROLAC TROMETHAMINE 30 MG/ML IJ SOLN
30.0000 mg | Freq: Four times a day (QID) | INTRAMUSCULAR | Status: DC | PRN
  Administered 2021-06-18 – 2021-06-19 (×4): 30 mg via INTRAVENOUS
  Filled 2021-06-18 (×4): qty 1

## 2021-06-18 MED ORDER — LABETALOL HCL 5 MG/ML IV SOLN: 10.0000 mg | INTRAVENOUS | Status: DC | PRN

## 2021-06-18 MED ORDER — LACTATED RINGERS IV BOLUS
1000.0000 mL | Freq: Once | INTRAVENOUS | Status: AC
Start: 2021-06-18 — End: 2021-06-18
  Administered 2021-06-18: 1000 mL via INTRAVENOUS

## 2021-06-18 MED ORDER — POLYETHYLENE GLYCOL 3350 17 G PO PACK: 17.0000 g | PACK | Freq: Every day | ORAL | Status: DC | PRN

## 2021-06-18 MED ORDER — TETANUS-DIPHTH-ACELL PERTUSSIS 5-2.5-18.5 LF-MCG/0.5 IM SUSY
0.5000 mL | PREFILLED_SYRINGE | Freq: Once | INTRAMUSCULAR | Status: AC
  Administered 2021-06-18: 0.5 mL via INTRAMUSCULAR
  Filled 2021-06-18: qty 0.5

## 2021-06-18 MED ORDER — ALBUTEROL SULFATE (2.5 MG/3ML) 0.083% IN NEBU: 2.5000 mg | INHALATION_SOLUTION | RESPIRATORY_TRACT | Status: DC | PRN

## 2021-06-18 MED ORDER — LABETALOL HCL 5 MG/ML IV SOLN
10.0000 mg | INTRAVENOUS | Status: DC | PRN
  Administered 2021-06-18 – 2021-06-21 (×7): 10 mg via INTRAVENOUS
  Filled 2021-06-18 (×8): qty 4

## 2021-06-18 MED ORDER — DOCUSATE SODIUM 100 MG PO CAPS
100.0000 mg | ORAL_CAPSULE | Freq: Two times a day (BID) | ORAL | Status: DC
  Administered 2021-06-18 – 2021-06-20 (×5): 100 mg via ORAL
  Filled 2021-06-18 (×6): qty 1

## 2021-06-18 MED ORDER — DIPHENHYDRAMINE HCL 50 MG/ML IJ SOLN
12.5000 mg | Freq: Once | INTRAMUSCULAR | Status: DC
  Filled 2021-06-18: qty 1

## 2021-06-18 MED ORDER — INSULIN ASPART 100 UNIT/ML IJ SOLN: 0.0000 [IU] | Freq: Every day | INTRAMUSCULAR | Status: DC

## 2021-06-18 MED ORDER — SODIUM CHLORIDE 0.9% FLUSH
3.0000 mL | Freq: Two times a day (BID) | INTRAVENOUS | Status: DC
  Administered 2021-06-18 – 2021-06-20 (×6): 3 mL via INTRAVENOUS

## 2021-06-18 MED ORDER — ONDANSETRON HCL 4 MG/2ML IJ SOLN
4.0000 mg | Freq: Four times a day (QID) | INTRAMUSCULAR | Status: DC | PRN
  Administered 2021-06-18 – 2021-06-20 (×4): 4 mg via INTRAVENOUS
  Filled 2021-06-18 (×4): qty 2

## 2021-06-18 MED ORDER — PANTOPRAZOLE SODIUM 40 MG PO TBEC
40.0000 mg | DELAYED_RELEASE_TABLET | Freq: Every day | ORAL | Status: DC
  Administered 2021-06-18 – 2021-06-21 (×4): 40 mg via ORAL
  Filled 2021-06-18 (×4): qty 1

## 2021-06-18 MED ORDER — AMLODIPINE BESYLATE 10 MG PO TABS
10.0000 mg | ORAL_TABLET | Freq: Every day | ORAL | Status: DC
  Administered 2021-06-18 – 2021-06-21 (×4): 10 mg via ORAL
  Filled 2021-06-18: qty 1
  Filled 2021-06-18: qty 2
  Filled 2021-06-18 (×2): qty 1

## 2021-06-18 MED ORDER — ACETAMINOPHEN 650 MG RE SUPP: 650.0000 mg | Freq: Four times a day (QID) | RECTAL | Status: DC | PRN

## 2021-06-18 MED ORDER — INSULIN GLARGINE-YFGN 100 UNIT/ML ~~LOC~~ SOLN
30.0000 [IU] | Freq: Every day | SUBCUTANEOUS | Status: DC
  Administered 2021-06-18 – 2021-06-19 (×2): 30 [IU] via SUBCUTANEOUS
  Filled 2021-06-18 (×2): qty 0.3

## 2021-06-18 NOTE — Assessment & Plan Note (Addendum)
-  Patient with poor home BP control presenting with uncontrolled BP -Possibly HTN urgency with current evidence of acute organ dysfunction -The patient was seen by PCCM after starting Nicardipine drip; PCCM recommended Labetalol and Hydralazine pushes and discontinuation of the drip --Will continue home Norvasc and Coreg, increase the dose of hydralazine to 50 mg 3 times a day

## 2021-06-18 NOTE — ED Triage Notes (Addendum)
Pt brought to ED by [facility] via stretcher with c/o left back, side and flank pain post fall yesterday. Pt experienced mechanical fall after tripping over flowerbed. EMS also noted CBG to 420 and BP to be extremely elevated. Pt endorses that he did  not take medications today.  Fentanyl IVP and Zofran 4mg  IVP administered by EMS PTA.

## 2021-06-18 NOTE — Assessment & Plan Note (Addendum)
-  Recent LDL was 246 as per 04/02/2021 -Statin was held due to recurrent pancreatitis -Check lipid panel tomorrow.  Will start on statin if LDL is still high

## 2021-06-18 NOTE — Assessment & Plan Note (Addendum)
-  Some of his recurrent abdominal pain and n/v may be associated with cannabinoid hyperemesis syndrome -Encourage marijuana cessation and a good bowel regimen as well as behavioral health support -UDS

## 2021-06-18 NOTE — Assessment & Plan Note (Addendum)
-  Patient with report of diffuse pain from fall without obvious injuries, also with c/o "pancreas" pain with negative lipase -CXR, tib/fib film, and A/P CT were all negative -Continue pain management, supportive care -He has exhibited drug-seeking behavior in the past and providing narcotics. -Complains of back pain.  Will start on low-dose oxycodone, continue muscle relaxants -At baseline, he does not have any ambulatory problems

## 2021-06-18 NOTE — ED Provider Notes (Signed)
[facility] EMERGENCY DEPARTMENT Provider Note  CSN: [id number] Arrival date & time: 06/18/21 3244  Chief Complaint(s) Fall, Back Pain, and Flank Pain  HPI Randy [name]. is a 41 y.o. male with PMH HTN, HLD, T2DM, pancreatitis who presents the emergency department for evaluation of epigastric pain, left lower quadrant pain after a fall.  Patient states that approximately 4 PM today he struck his left shin against a raised flower bed and struck his left side against a brick wall.  No head strike or loss of consciousness.  He states that he attempted to treat his symptoms at home, but the pain became unbearable and he came to the hospital.  He states that prior to the fall he was already experiencing symptoms of pancreatitis that he was attempting to manage at home.  Denies nausea, vomiting, chest pain, shortness of breath, headache or other systemic or traumatic complaints.   Fall Associated symptoms include abdominal pain.  Back Pain Associated symptoms: abdominal pain   Flank Pain Associated symptoms include abdominal pain.   Past Medical History Past Medical History:  Diagnosis Date   Essential hypertension 05/31/2019   HLD (hyperlipidemia)    Hypertension    Pancreatitis    Type II diabetes mellitus ([place])    Patient Active Problem List   Diagnosis Date Noted   Acute pancreatitis 09/17/2020   Abnormal urinalysis 09/17/2020   Intractable nausea and vomiting 09/17/2020   Hypertensive crisis 09/12/2020   Nausea & vomiting 09/12/2020   AKI (acute kidney injury) ([place]) 09/12/2020   GERD (gastroesophageal reflux disease) 09/12/2020   Type 2 diabetes mellitus without complication, with long-term current use of insulin ([place])    Hyperlipidemia 06/01/2019   Microalbuminuria due to type 2 diabetes mellitus ([place]) 06/01/2019   Essential hypertension 05/31/2019   Hypertensive urgency 08/31/2015   Type 2 diabetes mellitus with hyperglycemia, with long-term current use  of insulin ([place]) 08/31/2015   Marijuana abuse 08/31/2015   Home Medication(s) Prior to Admission medications   Medication Sig Start Date End Date Taking? Authorizing Provider  HYDROcodone-acetaminophen (NORCO/VICODIN) 5-325 MG tablet Take 1 tablet by mouth every 6 (six) hours as needed for moderate pain. 04/19/21   Noralyn Pick, NP  amLODipine (NORVASC) 10 MG tablet Take 1 tablet (10 mg total) by mouth daily. 04/09/21 06/22/21  Dorna Mai, MD  Blood Glucose Monitoring Suppl (TRUE METRIX METER) w/Device KIT Use as directed 10/30/20   Camillia Herter, NP  carvedilol (COREG) 25 MG tablet Take 1 tablet (25 mg total) by mouth 2 (two) times daily with a meal. 04/09/21 06/22/21  Dorna Mai, MD  glucose blood (TRUE METRIX BLOOD GLUCOSE TEST) test strip Use as instructed 10/30/20   Camillia Herter, NP  insulin glargine (LANTUS) 100 UNIT/ML Solostar Pen Inject 30 Units into the skin daily. 10/30/20 04/02/21  Camillia Herter, NP  Insulin Pen Needle 31G X 8 MM MISC use as directed 10/30/20   Camillia Herter, NP  omeprazole (PRILOSEC) 40 MG capsule Take 1 capsule (40 mg total) by mouth daily. 05/23/21   Camillia Herter, NP  ondansetron (ZOFRAN) 4 MG tablet Take 1 tablet (4 mg total) by mouth every 6 (six) hours as needed for nausea or vomiting. 04/18/21   Esterwood, Amy S, PA-C  ondansetron (ZOFRAN-ODT) 4 MG disintegrating tablet Take 1 tablet (4 mg total) by mouth every 8 (eight) hours as needed for nausea or vomiting. 04/04/21   Thurnell Lose, MD  polyethylene glycol [facility] /  GLYCOLAX) 17 g packet Take 17 g by mouth daily.    [provider]  sildenafil (VIAGRA) 100 MG tablet TAKE 0.5-1 TABLETS (50-100 MG TOTAL) BY MOUTH DAILY AS NEEDED FOR ERECTILE DYSFUNCTION. 01/12/21 01/12/22  Dorna Mai, MD  TRUEplus Lancets 28G MISC Use as directed 10/30/20   Camillia Herter, NP                                                                                                                                     Past Surgical History Past Surgical History:  Procedure Laterality Date   NO PAST SURGERIES     Family History Family History  Problem Relation Age of Onset   Hypertension Mother    Diabetes Father    Hypertension Father    Diabetes Brother    Diabetes Maternal Grandmother    Colon cancer Neg Hx    Esophageal cancer Neg Hx    Rectal cancer Neg Hx    Stomach cancer Neg Hx     Social History Social History   Tobacco Use   Smoking status: Former    Packs/day: 0.33    Years: 17.00    Pack years: 5.61    Types: Cigarettes    Quit date: 12/26/2011    Years since quitting: 9.4   Smokeless tobacco: Never  Vaping Use   Vaping Use: Never used  Substance Use Topics   Alcohol use: No    Comment: no alcohol since age 31   Drug use: Not Currently    Types: Marijuana    Comment: Nov 2020   Allergies Tomato  Review of Systems Review of Systems  Gastrointestinal:  Positive for abdominal pain.  Genitourinary:  Positive for flank pain.  Musculoskeletal:  Positive for back pain.  Physical Exam Vital Signs  I have reviewed the triage vital signs BP (!) 227/101    Pulse 82    Resp 12    SpO2 98%   Physical Exam Vitals and nursing note reviewed.  Constitutional:      General: He is not in acute distress.    Appearance: He is well-developed.  HENT:     Head: Normocephalic and atraumatic.  Eyes:     Conjunctiva/sclera: Conjunctivae normal.  Cardiovascular:     Rate and Rhythm: Normal rate and regular rhythm.     Heart sounds: No murmur heard. Pulmonary:     Effort: Pulmonary effort is normal. No respiratory distress.     Breath sounds: Normal breath sounds.  Abdominal:     Palpations: Abdomen is soft.     Tenderness: There is abdominal tenderness.  Musculoskeletal:        General: Signs of injury present. No swelling.     Cervical back: Neck supple.  Skin:    General: Skin is warm and dry.     Capillary Refill: Capillary refill takes less than 2  seconds.  Findings: Lesion (Left shin) present.  Neurological:     Mental Status: He is alert.  Psychiatric:        Mood and Affect: Mood normal.    ED Results and Treatments Labs (all labs ordered are listed, but only abnormal results are displayed) Labs Reviewed  CBG MONITORING, ED - Abnormal; Notable for the following components:      Result Value   Glucose-Capillary 311 (*)    All other components within normal limits  COMPREHENSIVE METABOLIC PANEL  CBC WITH DIFFERENTIAL/PLATELET  URINALYSIS, ROUTINE W REFLEX MICROSCOPIC  LIPASE, BLOOD                                                                                                                          Radiology No results found.  Pertinent labs & imaging results that were available during my care of the patient were reviewed by me and considered in my medical decision making (see MDM for details).  Medications Ordered in ED Medications  morphine (PF) 4 MG/ML injection 4 mg (has no administration in time range)  Tdap (BOOSTRIX) injection 0.5 mL (has no administration in time range)  ondansetron (ZOFRAN) injection 4 mg (has no administration in time range)                                                                                                                                     Procedures .Marland KitchenLaceration Repair  Date/Time: 06/18/2021 4:00 AM Performed by: Teressa Lower, MD Authorized by: Teressa Lower, MD   Laceration details:    Location:  Leg   Leg location:  L lower leg   Length (cm):  0.5 Pre-procedure details:    Preparation:  Patient was prepped and draped in usual sterile fashion Treatment:    Area cleansed with:  Chlorhexidine Skin repair:    Repair method:  Tissue adhesive and Steri-Strips   Number of Steri-Strips:  1 Approximation:    Approximation:  Close Repair type:    Repair type:  Simple Post-procedure details:    Dressing:  Open (no dressing) .Critical Care Performed by:  Teressa Lower, MD Authorized by: Teressa Lower, MD   Critical care provider statement:    Critical care time (minutes):  30   Critical care was necessary to treat or prevent imminent or life-threatening deterioration of the following conditions: HTN emergency.   Critical care was time spent personally by  me on the following activities:  Development of treatment plan with patient or surrogate, discussions with consultants, evaluation of patient's response to treatment, examination of patient, ordering and review of laboratory studies, ordering and review of radiographic studies, ordering and performing treatments and interventions, pulse oximetry, re-evaluation of patient's condition and review of old charts  (including critical care time)  Medical Decision Making / ED Course   This patient presents to the ED for concern of abdominal pain, laceration and high blood pressure, this involves an extensive number of treatment options, and is a complaint that carries with it a high risk of complications and morbidity.  The differential diagnosis includes pancreatitis, intra-abdominal abscess, abdominal trauma, fracture, laceration, intra-abdominal infection  MDM: Seen Emergency Department for evaluation of abdominal pain, flank pain and hypertension.  Physical exam reveals a 0.5 cm laceration over the left shin, tenderness in the left lower quadrant and along the epigastrium.  Laboratory evaluation with hyperglycemia to 328, mild pseudohyponatremia to 132, lipase elevated to 52, hemoglobin 10.2.  Chest x-ray unremarkable with no evidence of rib fractures.  Creatinine 1.55 which is an elevation for this patient CT abdomen pelvis unremarkable.  We attempted to control the patient's pain with morphine and on reevaluation, patient's pain continues to be uncontrolled.  Patient arrives with systolic significant greater than 200 but we wanted to control his pain and give his home medications prior to  initiating Cardene drip for hypertensive emergency.  Unfortunately, on reevaluation, patient appears to be resting comfortably after taking his Coreg but his blood pressures remain greater than 200.  Cardene started and fluid resuscitation begun for suspected underlying pancreatitis.  Suspect patient unable to mount appropriate lipase response due to frequent pancreatitis.  Patient will be admitted for hypertensive emergency in the setting of an acute kidney injury and underlying pancreatitis.   Additional history obtained:  -External records from outside source obtained and reviewed including: Chart review including previous notes, labs, imaging, consultation notes   Lab Tests: -I ordered, reviewed, and interpreted labs.   The pertinent results include:   Labs Reviewed  CBG MONITORING, ED - Abnormal; Notable for the following components:      Result Value   Glucose-Capillary 311 (*)    All other components within normal limits  COMPREHENSIVE METABOLIC PANEL  CBC WITH DIFFERENTIAL/PLATELET  URINALYSIS, ROUTINE W REFLEX MICROSCOPIC  LIPASE, BLOOD        Imaging Studies ordered: I ordered imaging studies including CTAP, chest x-ray, XR tib-fib I independently visualized and interpreted imaging. I agree with the radiologist interpretation   Medicines ordered and prescription drug management: Meds ordered this encounter  Medications   morphine (PF) 4 MG/ML injection 4 mg   Tdap (BOOSTRIX) injection 0.5 mL   ondansetron (ZOFRAN) injection 4 mg    -I have reviewed the patients home medicines and have made adjustments as needed  Critical interventions Cardene drip   Cardiac Monitoring: The patient was maintained on a cardiac monitor.  I personally viewed and interpreted the cardiac monitored which showed an underlying rhythm of: Normal sinus rhythm  Social Determinants of Health:  Factors impacting patients care include: None   Reevaluation: After the interventions noted  above, I reevaluated the patient and found that they have :improved  Co morbidities that complicate the patient evaluation  Past Medical History:  Diagnosis Date   Essential hypertension 05/31/2019   HLD (hyperlipidemia)    Hypertension    Pancreatitis    Type II diabetes mellitus ([place])  Dispostion: I considered admission for this patient, and due to hypertensive emergency with acute kidney injury and suspected clinical pancreatitis patient will require admission.     Final Clinical Impression(s) / ED Diagnoses Final diagnoses:  None     _0 @    Teressa Lower, MD 06/18/21 [phone]

## 2021-06-18 NOTE — Progress Notes (Signed)
Pt states pain of 10/10 in abdomen and back/side states Toradol has not worked and has told the nurse the ED. Pt states takes percocet at home for pain. RN notified MD as well as BP being elevated 194/103 labetolol given 2315 previous was 176/95 gave hydralazine 10 mg at 1059.

## 2021-06-18 NOTE — Consult Note (Addendum)
NAME:  Willson Lipa., MRN:  [id number], DOB:  29-Nov-1980, LOS: 0 ADMISSION DATE:  06/18/2021, CONSULTATION DATE:  2/6 REFERRING MD:  K[place], CHIEF COMPLAINT:  epigastric pain, LLQ pain  History of Present Illness:   Kyland No., is a 41 y.o. male, who presented to the [facility] ED with a chief complaint of epigastric pain, LLQ pain after a fall   They have a pertinent past medical history of pancreatitis, HTN, HLD, DMII  Patient falling after striking a raised flower bed. Denies LOC. Pain worsened at home and presented to ED. Endorses not taking PO antihypertensives on 2/6  ED course was notable for initial BP of 227/101. CT abdomen pelvis negative for acute process. Lipase slightly elevated but appears chronic. Creat 1.55 (baseline 1.27-1.38). 1L IVF given in ED. Started on nicardipine by EDP.  PCCM was consulted for admission  Pertinent  Medical History  pancreatitis, HTN, HLD, DMII  Significant Hospital Events: Including procedures, antibiotic start and stop dates in addition to other pertinent events   2/6 presented to [facility] ED, CT abdomen pelvis w contrast: No pancreatic. Ductal dilation or surrounding inflammatory changes. No acute process in abdomen.  Interim History / Subjective:  See above  Subjective: denies chest pain, sob, bleeding, pain  Objective   Blood pressure (!) 157/90, pulse 84, resp. rate 11, SpO2 98 %.       No intake or output data in the 24 hours ending 06/18/21 0558 There were no vitals filed for this visit.  Examination: General: In bed, NAD, appears comfortable, as;eep HEENT: MM pink/moist, anicteric, atraumatic Neuro: RASS 0, PERRL 40m, GCS 15, MAE CV: S1S2, NSR, no m/r/g appreciated PULM:  clear in the upper lobes, clear in the lower lobes, trachea midline, chest expansion symmetric GI: soft, bsx4 active, non-tender   Extremities: warm/dry, no pretibial edema, capillary refill less than 3 seconds  Skin:  no rashes or lesions  noted  Labs: BG 311 NA 132 Creat 1.55 (baseline 1.27-1.38) LFT: WNL HGB 10.2 Lipase 52 CXR: no effusion, pneumo, or infultrate Xray tib/fib L: neg CT abdomen pelvis w contrast: No pancreatic. Ductal dilation or surrounding inflammatory changes. No acute process in abdomen.  Resolved Hospital Problem list     Assessment & Plan:  HTN Initial MAP: 143. Current MAP 112.  25% reduction of initial MAP would be 108. Patient reports missing doses of antihypertensives. Denies cocaine use. Denies Viagra use for 2 months. Started on nicardipine by EDP. No other IV antihypertensives attempted. Neuro intact. On RA. Denies chest pain or SOB No acute organ dysfunction. Creat 1.55 (baseline 1.27-1.38) Plan -Resume home amlodipine and coreg, has already received dose of coreg. -Stop nicardipine GTT -Add PRN hydralazine and labetalol -Check UDS -If change in neuro status, recommend head CT -recommend checking ECG, BNP and troponin -If unable to maintain BP control after resumption of BP medications and PRN IV agents, re engage PCCM. -If unable to D/C home, rec admission to hospitalist service   ? Acute on chronic pancreatitis CT abdomen pelvis w contrast: No pancreatic. Ductal dilation or surrounding inflammatory changes. No acute process in abdomen. LFT: WNL. Lipase 52. Appears chronically elevated. Given 1 L IVF in ED. No abdominal pain. -Monitor -Recommend start diet  Mechanical fall Xray tib/fib L: neg. Tripped over flower bed. -Fall precautions  DMII, suspect poorly controlled -Check A1C -Blood Glucose goal 140-180. -SSI -Recommend resuming lantus  HX HLD -resume statin  Normocytic anemia, appears chronic -Transfuse PRBC if HBG less  than 7 -Obtain AM CBC to trend H&H -Monitor for signs of bleeding  Hyponatremia NA 132 -monitor   Best Practice (right click and "Reselect all SmartList Selections" daily)   Diet/type: Regular consistency (see orders) DVT prophylaxis:  SCD GI prophylaxis: N/A Lines: N/A Foley:  N/A Code Status:  full code Last date of multidisciplinary goals of care discussion [Pending]  Labs   CBC: Recent Labs  Lab 06/18/21 0048  WBC 7.8  NEUTROABS 3.7  HGB 10.2*  HCT 29.9*  MCV 84.9  PLT 035    Basic Metabolic Panel: Recent Labs  Lab 06/18/21 0048  NA 132*  K 4.2  CL 102  CO2 24  GLUCOSE 328*  BUN 17  CREATININE 1.55*  CALCIUM 8.1*   GFR: CrCl cannot be calculated (Unknown ideal weight.). Recent Labs  Lab 06/18/21 0048  WBC 7.8    Liver Function Tests: Recent Labs  Lab 06/18/21 0048  AST 17  ALT 16  ALKPHOS 123  BILITOT 0.5  PROT 6.2*  ALBUMIN 2.8*   Recent Labs  Lab 06/18/21 0048  LIPASE 52*   No results for input(s): AMMONIA in the last 168 hours.  ABG    Component Value Date/Time   HCO3 31.7 (H) 05/01/2018 0855   TCO2 34 (H) 05/01/2018 0855   ACIDBASEDEF 2.0 09/28/2017 1453   O2SAT 16.0 05/01/2018 0855     Coagulation Profile: No results for input(s): INR, PROTIME in the last 168 hours.  Cardiac Enzymes: No results for input(s): CKTOTAL, CKMB, CKMBINDEX, TROPONINI in the last 168 hours.  HbA1C: HbA1c POC (<> result, manual entry)  Date/Time Value Ref Range Status  05/31/2019 09:44 AM 12.8 4.0 - 5.6 % Final   Hgb A1c MFr Bld  Date/Time Value Ref Range Status  04/02/2021 05:54 AM 7.7 (H) 4.8 - 5.6 % Final    Comment:    (NOTE) Pre diabetes:          5.7%-6.4%  Diabetes:              >6.4%  Glycemic control for   <7.0% adults with diabetes   09/17/2020 07:15 AM 10.3 (H) 4.8 - 5.6 % Final    Comment:    (NOTE) Pre diabetes:          5.7%-6.4%  Diabetes:              >6.4%  Glycemic control for   <7.0% adults with diabetes     CBG: Recent Labs  Lab 06/18/21 0046  GLUCAP 311*    Review of Systems:   Positives in bold  Gen: fever, chills, weight change, fatigue, night sweats HEENT:  blurred vision, double vision, hearing loss, tinnitus, sinus congestion,  rhinorrhea, sore throat, neck stiffness, dysphagia PULM:  shortness of breath, cough, sputum production, hemoptysis, wheezing CV: chest pain, edema, orthopnea, paroxysmal nocturnal dyspnea, palpitations GI:  abdominal pain, nausea, vomiting, diarrhea, hematochezia, melena, constipation, change in bowel habits GU: dysuria, hematuria, polyuria, oliguria, urethral discharge Endocrine: hot or cold intolerance, polyuria, polyphagia or appetite change Derm: rash, dry skin, scaling or peeling skin change Heme: easy bruising, bleeding, bleeding gums Neuro: headache, numbness, weakness, slurred speech, loss of memory or consciousness   Past Medical History:  He,  has a past medical history of Essential hypertension (05/31/2019), HLD (hyperlipidemia), Hypertension, Pancreatitis, and Type II diabetes mellitus ([place]).   Surgical History:   Past Surgical History:  Procedure Laterality Date   NO PAST SURGERIES       Social History:  reports that he quit smoking about 9 years ago. His smoking use included cigarettes. He has a 5.61 pack-year smoking history. He has never used smokeless tobacco. He reports that he does not currently use drugs after having used the following drugs: Marijuana. He reports that he does not drink alcohol.   Family History:  His family history includes Diabetes in his brother, father, and maternal grandmother; Hypertension in his father and mother. There is no history of Colon cancer, Esophageal cancer, Rectal cancer, or Stomach cancer.   Allergies Allergies  Allergen Reactions   Tomato     unknown     Home Medications  Prior to Admission medications   Medication Sig Start Date End Date Taking? Authorizing Provider  amLODipine (NORVASC) 10 MG tablet Take 1 tablet (10 mg total) by mouth daily. 04/09/21 06/22/21 Yes Dorna Mai, MD  Blood Glucose Monitoring Suppl (TRUE METRIX METER) w/Device KIT Use as directed 10/30/20  Yes Minette Brine, Amy J, NP  carvedilol (COREG) 25  MG tablet Take 1 tablet (25 mg total) by mouth 2 (two) times daily with a meal. 04/09/21 06/22/21 Yes Dorna Mai, MD  glucose blood (TRUE METRIX BLOOD GLUCOSE TEST) test strip Use as instructed 10/30/20  Yes Minette Brine, Amy J, NP  HYDROcodone-acetaminophen (NORCO/VICODIN) 5-325 MG tablet Take 1 tablet by mouth every 6 (six) hours as needed for moderate pain. Patient not taking: Reported on 06/18/2021 04/19/21   Noralyn Pick, NP  insulin glargine (LANTUS) 100 UNIT/ML Solostar Pen Inject 30 Units into the skin daily. 10/30/20 06/18/21 Yes Minette Brine, Amy J, NP  Insulin Pen Needle 31G X 8 MM MISC use as directed 10/30/20  Yes Minette Brine, Amy J, NP  omeprazole (PRILOSEC) 40 MG capsule Take 1 capsule (40 mg total) by mouth daily. 05/23/21  Yes Minette Brine, Amy J, NP  ondansetron (ZOFRAN) 4 MG tablet Take 1 tablet (4 mg total) by mouth every 6 (six) hours as needed for nausea or vomiting. 04/18/21  Yes Esterwood, Amy S, PA-C  sildenafil (VIAGRA) 100 MG tablet TAKE 0.5-1 TABLETS (50-100 MG TOTAL) BY MOUTH DAILY AS NEEDED FOR ERECTILE DYSFUNCTION. Patient taking differently: Take 50-100 mg by mouth as needed for erectile dysfunction. 01/12/21 01/12/22 Yes Dorna Mai, MD  TRUEplus Lancets 28G MISC Use as directed 10/30/20  Yes Camillia Herter, NP     Critical care time: n/a     [employer]., MSN, APRN, AGACNP-BC [facility]  06/18/2021 , 6:09 AM  Please see [email or web address] for pager details  If no response, please call [phone] After hours, please call Elink at [phone]

## 2021-06-18 NOTE — Assessment & Plan Note (Addendum)
-  Last A1c was 7.7 -Continue current insulin regimen -Monitor blood sugars -Diabetic coordinator following

## 2021-06-18 NOTE — H&P (Signed)
History and Physical    Patient: Randy [name]. FKC:[id number] DOB: Jul 13, 1980 DOA: 06/18/2021 DOS: the patient was seen and examined on 06/18/2021 PCP: Camillia Herter, NP  Patient coming from: Home - lives with mother and son; NOK: Mother, Lerry Liner, [phone]   Chief Complaint: Pain after a fall  HPI: Randy [name]. is a 41 y.o. male with medical history significant of HTN; HLD; DM; and recurrent pancreatitis presenting with pain after a fall.   His "pancreas was hurting and then I fell."  He has had recurrent pancreatitis.  Able to eat and drink.  He was too somnolent to answer questions and appeared quite comfortable but complained of severe pain.    ER Course:  Carryover, per Dr. Marlowe Sax:  Patient with history of hypertension, hyperlipidemia, diabetes, pancreatitis presented with abdominal pain and hypertensive emergency.  Arrived with systolic in the 163W.  Hyperglycemic to the 300s, no DKA.  No significant elevation of lipase.  CT abdomen pelvis negative.  Creatinine 1.5, increased from baseline.  He was given his home blood pressure medications but continued to be hypertensive.  Started on nicardipine drip after which blood pressure improved.  Critical care stopped nicardipine and ordered as needed hydralazine and labetalol.  Felt that he was stable for stepdown admission.     Review of Systems: As mentioned in the history of present illness. All other systems reviewed and are negative.  Limited by somnolence, ?disinterest.  Past Medical History:  Diagnosis Date   Essential hypertension 05/31/2019   HLD (hyperlipidemia)    Pancreatitis    Type II diabetes mellitus ([place])    Past Surgical History:  Procedure Laterality Date   NO PAST SURGERIES     Social History:  reports that he quit smoking about 9 years ago. His smoking use included cigarettes. He has a 5.61 pack-year smoking history. He has never used smokeless tobacco. He reports that he does not currently  use drugs after having used the following drugs: Marijuana. He reports that he does not drink alcohol.  Allergies  Allergen Reactions   Tomato     unknown    Family History  Problem Relation Age of Onset   Hypertension Mother    Diabetes Father    Hypertension Father    Diabetes Brother    Diabetes Maternal Grandmother    Colon cancer Neg Hx    Esophageal cancer Neg Hx    Rectal cancer Neg Hx    Stomach cancer Neg Hx     Prior to Admission medications   Medication Sig Start Date End Date Taking? Authorizing Provider  amLODipine (NORVASC) 10 MG tablet Take 1 tablet (10 mg total) by mouth daily. 04/09/21 06/22/21 Yes Dorna Mai, MD  Blood Glucose Monitoring Suppl (TRUE METRIX METER) w/Device KIT Use as directed 10/30/20  Yes Minette Brine, Amy J, NP  carvedilol (COREG) 25 MG tablet Take 1 tablet (25 mg total) by mouth 2 (two) times daily with a meal. 04/09/21 06/22/21 Yes Dorna Mai, MD  glucose blood (TRUE METRIX BLOOD GLUCOSE TEST) test strip Use as instructed 10/30/20  Yes Minette Brine, Amy J, NP  HYDROcodone-acetaminophen (NORCO/VICODIN) 5-325 MG tablet Take 1 tablet by mouth every 6 (six) hours as needed for moderate pain. Patient not taking: Reported on 06/18/2021 04/19/21   Noralyn Pick, NP  insulin glargine (LANTUS) 100 UNIT/ML Solostar Pen Inject 30 Units into the skin daily. 10/30/20 06/18/21 Yes Minette Brine, Amy J, NP  Insulin Pen Needle 31G X 8 MM MISC  use as directed 10/30/20  Yes Minette Brine, Amy J, NP  omeprazole (PRILOSEC) 40 MG capsule Take 1 capsule (40 mg total) by mouth daily. 05/23/21  Yes Minette Brine, Amy J, NP  ondansetron (ZOFRAN) 4 MG tablet Take 1 tablet (4 mg total) by mouth every 6 (six) hours as needed for nausea or vomiting. 04/18/21  Yes Esterwood, Amy S, PA-C  sildenafil (VIAGRA) 100 MG tablet TAKE 0.5-1 TABLETS (50-100 MG TOTAL) BY MOUTH DAILY AS NEEDED FOR ERECTILE DYSFUNCTION. Patient taking differently: Take 50-100 mg by mouth as needed for erectile  dysfunction. 01/12/21 01/12/22 Yes Dorna Mai, MD  TRUEplus Lancets 28G MISC Use as directed 10/30/20  Yes Camillia Herter, NP    Physical Exam: Vitals:   06/18/21 0700 06/18/21 0750 06/18/21 0800 06/18/21 0815  BP: (!) 172/106 (!) 209/119 (!) 193/106 (!) 183/92  Pulse: 76 87 89 88  Resp: 18 11  (!) 8  Temp:  98.2 F (36.8 C)    TempSrc:  Oral    SpO2: 100% 98% 100% 100%   General:  Appears calm and comfortable and is in NAD Eyes:  PERRL, EOMI, normal lids, iris ENT:  grossly normal hearing, lips & tongue, mmm; appropriate dentition Neck:  no LAD, masses or thyromegaly Cardiovascular:  RRR, no m/r/g. No LE edema.  Respiratory:   CTA bilaterally with no wheezes/rales/rhonchi.  Normal respiratory effort. Abdomen:  soft, ND, reported severe diffuse abdominal TTP Skin:  no rash or induration seen on limited exam Musculoskeletal:  grossly normal tone BUE/BLE, good ROM, no bony abnormality Psychiatric:  grossly normal mood and affect, speech fluent and appropriate, AOx3 Neurologic:  CN 2-12 grossly intact, moves all extremities in coordinated fashion   Radiological Exams on Admission: Independently reviewed - see discussion in A/P where applicable  DG Chest 2 View  Result Date: 06/18/2021 CLINICAL DATA:  Fall, concern for left-sided rib fracture. EXAM: CHEST - 2 VIEW COMPARISON:  04/08/2020. FINDINGS: The heart size and mediastinal contours are within normal limits. Lung volumes are low. No consolidation, effusion, or pneumothorax. No obvious rib fracture or other acute osseous abnormality. IMPRESSION: No active cardiopulmonary disease. Electronically Signed   By: Brett Fairy M.D.   On: 06/18/2021 01:27   DG Tibia/Fibula Left  Result Date: 06/18/2021 CLINICAL DATA:  Fall, hit mid shin.  Rule out fracture. EXAM: LEFT TIBIA AND FIBULA - 2 VIEW COMPARISON:  None. FINDINGS: There is no evidence of fracture or other focal bone lesions. Vascular calcifications are present in the soft tissues.  IMPRESSION: No acute fracture or dislocation. Electronically Signed   By: Brett Fairy M.D.   On: 06/18/2021 01:25   CT ABDOMEN PELVIS W CONTRAST  Result Date: 06/18/2021 CLINICAL DATA:  Left lower quadrant abdominal pain, trauma EXAM: CT ABDOMEN AND PELVIS WITH CONTRAST TECHNIQUE: Multidetector CT imaging of the abdomen and pelvis was performed using the standard protocol following bolus administration of intravenous contrast. RADIATION DOSE REDUCTION: This exam was performed according to the departmental dose-optimization program which includes automated exposure control, adjustment of the mA and/or kV according to patient size and/or use of iterative reconstruction technique. CONTRAST:  155mL OMNIPAQUE IOHEXOL 300 MG/ML  SOLN COMPARISON:  04/02/2021 FINDINGS: Lower chest: No acute abnormality. Hepatobiliary: No focal liver abnormality is seen. No gallstones, gallbladder wall thickening, or biliary dilatation. Pancreas: Unremarkable. No pancreatic ductal dilatation or surrounding inflammatory changes. Spleen: Normal in size without focal abnormality. Adrenals/Urinary Tract: Adrenal glands are unremarkable. Kidneys are normal, without renal calculi, focal lesion, or hydronephrosis. Bladder  is unremarkable. Stomach/Bowel: Stomach is within normal limits. Appendix appears normal. No evidence of bowel wall thickening, distention, or inflammatory changes. Vascular/Lymphatic: No significant vascular findings are present. No enlarged abdominal or pelvic lymph nodes. Reproductive: Prostate is unremarkable. Other: No free fluid or free air in the abdomen or pelvis. No significant soft tissue injury. Musculoskeletal: No acute osseous abnormality. IMPRESSION: No acute process in the abdomen or pelvis. Electronically Signed   By: Merilyn Baba M.D.   On: 06/18/2021 02:11    EKG: Independently reviewed.  NSR with rate 76;  ST changes likely related to early repolarization   Labs on Admission: I have personally  reviewed the available labs and imaging studies at the time of the admission.  Pertinent labs:    Glucose 328 BUN 17/Creatinine 1.55/GFR 57 - stable to mildly elevated from baseline Albumin 2.8 BNP 148.9 HS troponin 18 WBC 7.8 Hgb 10.2 - stable UA: >500 glucose, small Hgb, >300 protein    Assessment and Plan: * Hypertensive urgency- (present on admission) -Patient with poor home BP control presenting with uncontrolled BP -Possibly HTN urgency but there is no current evidence of acute organ dysfunction -The patient was seen by PCCM after starting Nicardipine drip; PCCM recommended Labetalol and Hydralazine pushes and discontinuation of the drip -Will observe overnight in progressive care -Will cycle troponin - minimal initial elevation likely associated with demand ischemia -Will continue home Norvasc and Coreg  Fall at home, initial encounter -Patient with report of diffuse pain from fall without obvious injuries, also with c/o "pancreas" pain with negative lipase -CXR, tib/fib film, and A/P CT were all negative -Will provide Tylenol and Toradol (when BP is controlled) -He has exhibited drug-seeking behavior in the past and providing narcotics when not indicated is not in his best interests  Hyperlipidemia- (present on admission) -Recent LDL was 246(!) -Statin was held due to recurrent pancreatitis -It is likely time to resume - either at time of hospital dc or in the near future at PCP f/u  Marijuana abuse- (present on admission) -Some of his recurrent abdominal pain and n/v may be associated with cannabinoid hyperemesis syndrome -Encourage marijuana cessation and a good bowel regimen as well as behavioral health support -UDS is pending  Type 2 diabetes mellitus with hyperglycemia, with long-term current use of insulin (HCC) -Last A1c was 7.7 -Continue glargine -Cover with moderate-scale SSI       Advance Care Planning:   Code Status: Full Code   Consults:  PCCM  DVT Prophylaxis: Lovenox  Family Communication: None present  Severity of Illness: The appropriate patient status for this patient is OBSERVATION. Observation status is judged to be reasonable and necessary in order to provide the required intensity of service to ensure the patient's safety. The patient's presenting symptoms, physical exam findings, and initial radiographic and laboratory data in the context of their medical condition is felt to place them at decreased risk for further clinical deterioration. Furthermore, it is anticipated that the patient will be medically stable for discharge from the hospital within 2 midnights of admission.   Author: Karmen Bongo, MD 06/18/2021 8:26 AM  For on call review [email or web address].

## 2021-06-19 LAB — GLUCOSE, CAPILLARY
Glucose-Capillary: 241 mg/dL — ABNORMAL HIGH (ref 70–99)
Glucose-Capillary: 307 mg/dL — ABNORMAL HIGH (ref 70–99)
Glucose-Capillary: 81 mg/dL (ref 70–99)
Glucose-Capillary: 167 mg/dL — ABNORMAL HIGH (ref 70–99)

## 2021-06-19 LAB — RESP PANEL BY RT-PCR (FLU A&B, COVID) ARPGX2
Influenza A by PCR: NEGATIVE
Influenza B by PCR: NEGATIVE
SARS Coronavirus 2 by RT PCR: NEGATIVE

## 2021-06-19 LAB — BASIC METABOLIC PANEL
Anion gap: 11 (ref 5–15)
BUN: 21 mg/dL — ABNORMAL HIGH (ref 6–20)
CO2: 24 mmol/L (ref 22–32)
Calcium: 9.1 mg/dL (ref 8.9–10.3)
Chloride: 101 mmol/L (ref 98–111)
Creatinine, Ser: 1.69 mg/dL — ABNORMAL HIGH (ref 0.61–1.24)
GFR, Estimated: 51 mL/min — ABNORMAL LOW (ref >60)
Glucose, Bld: 225 mg/dL — ABNORMAL HIGH (ref 70–99)
Potassium: 4.1 mmol/L (ref 3.5–5.1)
Sodium: 136 mmol/L (ref 135–145)

## 2021-06-19 LAB — CBC
HCT: 33.9 % — ABNORMAL LOW (ref 39.0–52.0)
Hemoglobin: 11.3 g/dL — ABNORMAL LOW (ref 13.0–17.0)
MCH: 27.3 pg (ref 26.0–34.0)
MCHC: 33.3 g/dL (ref 30.0–36.0)
MCV: 81.9 fL (ref 80.0–100.0)
Platelets: 336 10*3/uL (ref 150–400)
RBC: 4.14 MIL/uL — ABNORMAL LOW (ref 4.22–5.81)
RDW: 12.5 % (ref 11.5–15.5)
WBC: 10.4 10*3/uL (ref 4.0–10.5)
nRBC: 0 % (ref 0.0–0.2)

## 2021-06-19 MED ORDER — LACTATED RINGERS IV SOLN: INTRAVENOUS | Status: DC

## 2021-06-19 MED ORDER — OXYCODONE HCL 5 MG PO TABS: 5.0000 mg | ORAL_TABLET | Freq: Four times a day (QID) | ORAL | Status: DC | PRN

## 2021-06-19 MED ORDER — INSULIN GLARGINE-YFGN 100 UNIT/ML ~~LOC~~ SOLN
35.0000 [IU] | Freq: Every day | SUBCUTANEOUS | Status: DC
  Administered 2021-06-20 – 2021-06-21 (×2): 35 [IU] via SUBCUTANEOUS
  Filled 2021-06-19 (×2): qty 0.35

## 2021-06-19 MED ORDER — HYDRALAZINE HCL 10 MG PO TABS
10.0000 mg | ORAL_TABLET | Freq: Three times a day (TID) | ORAL | Status: DC
  Administered 2021-06-19 – 2021-06-20 (×3): 10 mg via ORAL
  Filled 2021-06-19 (×3): qty 1

## 2021-06-19 MED ORDER — INSULIN ASPART 100 UNIT/ML IJ SOLN
2.0000 [IU] | Freq: Three times a day (TID) | INTRAMUSCULAR | Status: DC
  Administered 2021-06-19 – 2021-06-21 (×5): 2 [IU] via SUBCUTANEOUS

## 2021-06-19 NOTE — Progress Notes (Signed)
Inpatient Diabetes Program Recommendations  AACE/ADA: New Consensus Statement on Inpatient Glycemic Control (2015)  Target Ranges:  Prepandial:   less than 140 mg/dL      Peak postprandial:   less than 180 mg/dL (1-2 hours)      Critically ill patients:  140 - 180 mg/dL   Lab Results  Component Value Date   GLUCAP 307 (H) 06/19/2021   HGBA1C 7.7 (H) 04/02/2021    Review of Glycemic Control  Latest Reference Range & Units 06/18/21 00:46 06/18/21 10:14 06/18/21 12:55 06/18/21 17:12 06/18/21 21:03 06/19/21 06:34 06/19/21 11:11  Glucose-Capillary 70 - 99 mg/dL 311 (H) 436 (H) 329 (H) 176 (H) 105 (H) 241 (H) 307 (H)  (H): Data is abnormally high  Diabetes history: DM2 Outpatient Diabetes medications: Lantus 30 units Current orders for Inpatient glycemic control: Semglee 30 units, Nov 0-15 units tid + hs 0-5 units correction scale  Inpatient Diabetes Program Recommendations:   Please consider: -Increase Semglee to 35 units -Add Novolog 3 units tid meal coverage if eats 50% Secure chat sent to Dr. Kurtis Bushman.  Thank you, Nani Gasser. Jessie Cowher, RN, MSN, CDE  Diabetes Coordinator Inpatient Glycemic Control Team Team Pager [phone] (8am-5pm) 06/19/2021 1:01 PM

## 2021-06-19 NOTE — Progress Notes (Addendum)
PROGRESS NOTE    Jeffery R Prince Solian.  YC:[id number] DOB: Jan 03, 1981 DOA: 06/18/2021 PCP: Camillia Herter, NP    Brief Narrative:  Sonda Rumble. is a 41 y.o. male with medical history significant of HTN; HLD; DM; and recurrent pancreatitis presenting with pain after a fall.   His "pancreas was hurting and then I fell."  He has had recurrent pancreatitis.  Able to eat and drink.  He was too somnolent to answer questions and appeared quite comfortable but complained of severe pain. ER Course:  Carryover, per Dr. Marlowe Sax:   Patient with history of hypertension, hyperlipidemia, diabetes, pancreatitis presented with abdominal pain and hypertensive emergency.  Arrived with systolic in the 123456.  Hyperglycemic to the 300s, no DKA.  No significant elevation of lipase.  CT abdomen pelvis negative.  Creatinine 1.5, increased from baseline.  He was given his home blood pressure medications but continued to be hypertensive.  Started on nicardipine drip after which blood pressure improved.  Critical care stopped nicardipine and ordered as needed hydralazine and labetalol.  Felt that he was stable for stepdown admission.  2/7 complaining of left sided pain which is palpable on exam.  It also hurts with movement.  His CT of abdomen/pelvis without acute issue   Consultants:  PCCM  Procedures:   Antimicrobials:      Subjective: No sob, cp, dizziness, or HA.   Objective: Vitals:   06/19/21 0849 06/19/21 1000 06/19/21 1100 06/19/21 1200  BP: (!) 186/99 (!) 177/88 (!) 158/83 (!) 171/96  Pulse: 91 90 94 79  Resp:  17 11 13   Temp:   98.6 F (37 C)   TempSrc:   Oral   SpO2:  97% 96% 97%  Weight:      Height:        Intake/Output Summary (Last 24 hours) at 06/19/2021 1520 Last data filed at 06/19/2021 0700 Gross per 24 hour  Intake 441.82 ml  Output 425 ml  Net 16.82 ml   Filed Weights   06/18/21 2034  Weight: 87 kg    Examination: Calm, NAD Cta no w/r Reg s1/s2 no gallop Soft  benign +bs Lt side (lateral) TTP  No edema Aaoxox3  Mood and affect appropriate in current setting     Data Reviewed: I have personally reviewed following labs and imaging studies  CBC: Recent Labs  Lab 06/18/21 0048 06/19/21 0605  WBC 7.8 10.4  NEUTROABS 3.7  --   HGB 10.2* 11.3*  HCT 29.9* 33.9*  MCV 84.9 81.9  PLT 258 123456   Basic Metabolic Panel: Recent Labs  Lab 06/18/21 0048 06/19/21 0605  NA 132* 136  K 4.2 4.1  CL 102 101  CO2 24 24  GLUCOSE 328* 225*  BUN 17 21*  CREATININE 1.55* 1.69*  CALCIUM 8.1* 9.1   GFR: Estimated Creatinine Clearance: 61 mL/min (A) (by C-G formula based on SCr of 1.69 mg/dL (H)). Liver Function Tests: Recent Labs  Lab 06/18/21 0048  AST 17  ALT 16  ALKPHOS 123  BILITOT 0.5  PROT 6.2*  ALBUMIN 2.8*   Recent Labs  Lab 06/18/21 0048  LIPASE 52*   No results for input(s): AMMONIA in the last 168 hours. Coagulation Profile: No results for input(s): INR, PROTIME in the last 168 hours. Cardiac Enzymes: No results for input(s): CKTOTAL, CKMB, CKMBINDEX, TROPONINI in the last 168 hours. BNP (last 3 results) No results for input(s): PROBNP in the last 8760 hours. HbA1C: No results for input(s): HGBA1C in  the last 72 hours. CBG: Recent Labs  Lab 06/18/21 1255 06/18/21 1712 06/18/21 2103 06/19/21 0634 06/19/21 1111  GLUCAP 329* 176* 105* 241* 307*   Lipid Profile: No results for input(s): CHOL, HDL, LDLCALC, TRIG, CHOLHDL, LDLDIRECT in the last 72 hours. Thyroid Function Tests: No results for input(s): TSH, T4TOTAL, FREET4, T3FREE, THYROIDAB in the last 72 hours. Anemia Panel: No results for input(s): VITAMINB12, FOLATE, FERRITIN, TIBC, IRON, RETICCTPCT in the last 72 hours. Sepsis Labs: No results for input(s): PROCALCITON, LATICACIDVEN in the last 168 hours.  Recent Results (from the past 240 hour(s))  MRSA Next Gen by PCR, Nasal     Status: None   Collection Time: 06/18/21  8:30 PM   Specimen: Nasal Mucosa;  Nasal Swab  Result Value Ref Range Status   MRSA by PCR Next Gen NOT DETECTED NOT DETECTED Final    Comment: (NOTE) The GeneXpert MRSA Assay (FDA approved for NASAL specimens only), is one component of a comprehensive MRSA colonization surveillance program. It is not intended to diagnose MRSA infection nor to guide or monitor treatment for MRSA infections. Test performance is not FDA approved in patients less than 56 years old. Performed at [facility], [place] [address], [place], [place] [address]          Radiology Studies: DG Chest 2 View  Result Date: 06/18/2021 CLINICAL DATA:  Fall, concern for left-sided rib fracture. EXAM: CHEST - 2 VIEW COMPARISON:  04/08/2020. FINDINGS: The heart size and mediastinal contours are within normal limits. Lung volumes are low. No consolidation, effusion, or pneumothorax. No obvious rib fracture or other acute osseous abnormality. IMPRESSION: No active cardiopulmonary disease. Electronically Signed   By: Brett Fairy M.D.   On: 06/18/2021 01:27   DG Tibia/Fibula Left  Result Date: 06/18/2021 CLINICAL DATA:  Fall, hit mid shin.  Rule out fracture. EXAM: LEFT TIBIA AND FIBULA - 2 VIEW COMPARISON:  None. FINDINGS: There is no evidence of fracture or other focal bone lesions. Vascular calcifications are present in the soft tissues. IMPRESSION: No acute fracture or dislocation. Electronically Signed   By: Brett Fairy M.D.   On: 06/18/2021 01:25   CT ABDOMEN PELVIS W CONTRAST  Result Date: 06/18/2021 CLINICAL DATA:  Left lower quadrant abdominal pain, trauma EXAM: CT ABDOMEN AND PELVIS WITH CONTRAST TECHNIQUE: Multidetector CT imaging of the abdomen and pelvis was performed using the standard protocol following bolus administration of intravenous contrast. RADIATION DOSE REDUCTION: This exam was performed according to the departmental dose-optimization program which includes automated exposure control, adjustment of the mA and/or kV according to  patient size and/or use of iterative reconstruction technique. CONTRAST:  176mL OMNIPAQUE IOHEXOL 300 MG/ML  SOLN COMPARISON:  04/02/2021 FINDINGS: Lower chest: No acute abnormality. Hepatobiliary: No focal liver abnormality is seen. No gallstones, gallbladder wall thickening, or biliary dilatation. Pancreas: Unremarkable. No pancreatic ductal dilatation or surrounding inflammatory changes. Spleen: Normal in size without focal abnormality. Adrenals/Urinary Tract: Adrenal glands are unremarkable. Kidneys are normal, without renal calculi, focal lesion, or hydronephrosis. Bladder is unremarkable. Stomach/Bowel: Stomach is within normal limits. Appendix appears normal. No evidence of bowel wall thickening, distention, or inflammatory changes. Vascular/Lymphatic: No significant vascular findings are present. No enlarged abdominal or pelvic lymph nodes. Reproductive: Prostate is unremarkable. Other: No free fluid or free air in the abdomen or pelvis. No significant soft tissue injury. Musculoskeletal: No acute osseous abnormality. IMPRESSION: No acute process in the abdomen or pelvis. Electronically Signed   By: Francetta Found.D.  On: 06/18/2021 02:11        Scheduled Meds:  amLODipine  10 mg Oral Daily   carvedilol  25 mg Oral BID WC   docusate sodium  100 mg Oral BID   enoxaparin (LOVENOX) injection  40 mg Subcutaneous Q24H   hydrALAZINE  10 mg Oral Q8H   insulin aspart  0-15 Units Subcutaneous TID WC   insulin aspart  0-5 Units Subcutaneous QHS   insulin glargine-yfgn  30 Units Subcutaneous Daily   pantoprazole  40 mg Oral Daily   sodium chloride flush  3 mL Intravenous Q12H   Continuous Infusions:  lactated ringers      Assessment & Plan:   Principal Problem:   Hypertensive urgency Active Problems:   Type 2 diabetes mellitus with hyperglycemia, with long-term current use of insulin (HCC)   Marijuana abuse   Hyperlipidemia   Fall at home, initial encounter   Hypertensive urgency-  (present on admission) -Patient with poor BP control presenting with uncontrolled BP -Possibly HTN urgency but there is no current evidence of acute organ dysfunction -The patient was seen by PCCM after starting Nicardipine drip; PCCM recommended Labetalol and Hydralazine pushes and discontinuation of the drip 2/7 TP mildly up but chronically elevated BP has room for improvement We will add hydralazine 10 mg 3 times daily, increase as tolerated Continue Norvasc and Coreg Continue IV hydralazine    Fall at home -Patient with report of diffuse pain from fall without obvious injuries, also with c/o "pancreas" pain with negative lipase -CXR, tib/fib film, and A/P CT were all negative 2/7 DC Toradol due to renal function  Pain on the left side is palpable likely due to muscle strain musculoskeletal in nature discussed this with patient Tylenol for pain musculoskeletal in nature Patient does exhibit drug-seeking behavior, will avoid narcotics Discussed with nursing   Type 2 diabetes mellitus with hyperglycemia, with long-term current use of insulin (HCC) -Last A1c was 7.7 2/7 increase Semglee to 35 units Add NovoLog 10 units 3 times daily with meals if needs 50%        Hyperlipidemia- (present on admission) -Recent LDL was 246 -Statin was held due to recurrent pancreatitis 2/7 will need to follow-up with PCP to either resume statin or get initiated with another medication   AKI  Likely prerenal Will start ivf for hydration Stop toradol     Marijuana abuse- (present on admission) -Some of his recurrent abdominal pain and n/v may be associated with cannabinoid hyperemesis syndrome -Encourage marijuana cessation and a good bowel regimen as well as behavioral health support UDS positive for marijuana        DVT prophylaxis: Lovenox Code Status: Full Family Communication: Bedside Disposition Plan: Back home Status is: Observation The patient remains OBS appropriate and  will d/c before 2 midnights.              LOS: 0 days   Time spent: 45 minutes with more than 50% on COC    Nolberto Hanlon, MD [facility] Pager [phone]  If 7PM-7AM, please contact night-coverage 06/19/2021, 3:20 PM

## 2021-06-20 LAB — CREATININE, SERUM
Creatinine, Ser: 2.02 mg/dL — ABNORMAL HIGH (ref 0.61–1.24)
GFR, Estimated: 41 mL/min — ABNORMAL LOW (ref >60)

## 2021-06-20 LAB — GLUCOSE, CAPILLARY
Glucose-Capillary: 119 mg/dL — ABNORMAL HIGH (ref 70–99)
Glucose-Capillary: 235 mg/dL — ABNORMAL HIGH (ref 70–99)
Glucose-Capillary: 110 mg/dL — ABNORMAL HIGH (ref 70–99)
Glucose-Capillary: 125 mg/dL — ABNORMAL HIGH (ref 70–99)

## 2021-06-20 LAB — SODIUM, URINE, RANDOM: Sodium, Ur: 85 mmol/L

## 2021-06-20 MED ORDER — HYDRALAZINE HCL 50 MG PO TABS
50.0000 mg | ORAL_TABLET | Freq: Three times a day (TID) | ORAL | Status: DC
  Administered 2021-06-20 – 2021-06-21 (×3): 50 mg via ORAL
  Filled 2021-06-20 (×3): qty 1

## 2021-06-20 MED ORDER — HYDRALAZINE HCL 50 MG PO TABS: 50.0000 mg | ORAL_TABLET | Freq: Three times a day (TID) | ORAL | Status: DC

## 2021-06-20 MED ORDER — CYCLOBENZAPRINE HCL 5 MG PO TABS
7.5000 mg | ORAL_TABLET | Freq: Three times a day (TID) | ORAL | Status: DC | PRN
  Administered 2021-06-20: 7.5 mg via ORAL
  Filled 2021-06-20 (×2): qty 1.5

## 2021-06-20 MED ORDER — OXYCODONE HCL 5 MG PO TABS
5.0000 mg | ORAL_TABLET | Freq: Four times a day (QID) | ORAL | Status: DC | PRN
  Administered 2021-06-20 – 2021-06-21 (×3): 5 mg via ORAL
  Filled 2021-06-20 (×3): qty 1

## 2021-06-20 MED ORDER — INSULIN ASPART 100 UNIT/ML IJ SOLN
0.0000 [IU] | Freq: Three times a day (TID) | INTRAMUSCULAR | Status: DC
  Administered 2021-06-20 – 2021-06-21 (×2): 3 [IU] via SUBCUTANEOUS

## 2021-06-20 MED ORDER — SODIUM CHLORIDE 0.9 % IV SOLN: INTRAVENOUS | Status: DC

## 2021-06-20 NOTE — Plan of Care (Signed)
°  Problem: Clinical Measurements: Goal: Ability to maintain clinical measurements within normal limits will improve Outcome: Not Progressing   Problem: Coping: Goal: Level of anxiety will decrease Outcome: Not Progressing   Problem: Coping: Goal: Ability to adjust to condition or change in health will improve Outcome: Not Progressing   Problem: Health Behavior/Discharge Planning: Goal: Ability to manage health-related needs will improve Outcome: Not Progressing   Problem: Metabolic: Goal: Ability to maintain appropriate glucose levels will improve Outcome: Not Progressing

## 2021-06-20 NOTE — Progress Notes (Signed)
Pt complains about abdominal pain. States that tylenol is not helping and he was unable to sleep. Paged Dr. Toniann Fail about pt persistent pain. Will continue to monitor.

## 2021-06-20 NOTE — Progress Notes (Signed)
Inpatient Diabetes Program Recommendations  AACE/ADA: New Consensus Statement on Inpatient Glycemic Control (2015)  Target Ranges:  Prepandial:   less than 140 mg/dL      Peak postprandial:   less than 180 mg/dL (1-2 hours)      Critically ill patients:  140 - 180 mg/dL   Lab Results  Component Value Date   GLUCAP 119 (H) 06/20/2021   HGBA1C 7.7 (H) 04/02/2021    Review of Glycemic Control  Latest Reference Range & Units 06/19/21 06:34 06/19/21 11:11 06/19/21 16:34 06/19/21 21:37 06/20/21 06:23  Glucose-Capillary 70 - 99 mg/dL 007 (H) Novolog 5 units 307 (H) Novolog 11 units 81 Novolog 2 units 167 (H) 119 (H)  (H): Data is abnormally high  Inpatient Diabetes Program Recommendations:   -Decrease Novolog correction to 0-9 units tid + hs Secure chat to Dr. Renford Dills.  Thank you, Billy Fischer. Clem Wisenbaker, RN, MSN, CDE  Diabetes Coordinator Inpatient Glycemic Control Team Team Pager [phone] (8am-5pm) 06/20/2021 8:10 AM

## 2021-06-20 NOTE — Assessment & Plan Note (Addendum)
-   Patient's last creatinine was 1.2 on 12/7.  On reviewing his previous labs, his creatinine has trended up to 2.4 as an 04/02/2021 -Creatinine trending up.  Continue IV fluids. 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 -Check BMP tomorrow

## 2021-06-20 NOTE — Plan of Care (Signed)
  Problem: Education: Goal: Knowledge of General Education information will improve Description: Including pain rating scale, medication(s)/side effects and non-pharmacologic comfort measures Outcome: Progressing   Problem: Health Behavior/Discharge Planning: Goal: Ability to manage health-related needs will improve Outcome: Progressing   Problem: Clinical Measurements: Goal: Will remain free from infection Outcome: Progressing Goal: Respiratory complications will improve Outcome: Progressing   Problem: Activity: Goal: Risk for activity intolerance will decrease Outcome: Progressing   Problem: Nutrition: Goal: Adequate nutrition will be maintained Outcome: Progressing   

## 2021-06-20 NOTE — Progress Notes (Signed)
Progress Note   Patient: Randy [name]. FXT:[id number] DOB: May 11, 1981 DOA: 06/18/2021     1 DOS: the patient was seen and examined on 06/20/2021   Brief hospital course: Patient is a 41 year old male with history of hypertension, hyperlipidemia, diabetes type 2, recurrent pancreatitis who presented here with fall resulting in abdominal pain.  On presentation he was severely hypertensive with a systolic blood pressure in the range of 200s.  Lab work showed hyper glycemia, no evidence of DKA, lipase level normal.  CT abdomen/pelvis was unremarkable.  Lab work also showed AKI with elevated creatinine.  Patient was initially started on nicardipine drip for severe hypertension.  Hospital course remarkable for persistent hypertension, AKI.  Assessment and Plan: * Hypertensive urgency- (present on admission) -Patient with poor home BP control presenting with uncontrolled BP -Possibly HTN urgency with current evidence of acute organ dysfunction -The patient was seen by PCCM after starting Nicardipine drip; PCCM recommended Labetalol and Hydralazine pushes and discontinuation of the drip --Will continue home Norvasc and Coreg, increase the dose of hydralazine to 50 mg 3 times a day  AKI (acute kidney injury) (HCC)- (present on admission) - Patient's last creatinine was 1.2 on 12/7.  On reviewing his previous labs, his creatinine has trended up to 2.4 as an 04/02/2021 -Creatinine trending up.  Continue IV fluids. 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 -Check BMP tomorrow  Fall at home, initial encounter -Patient with report of diffuse pain from fall without obvious injuries, also with c/o "pancreas" pain with negative lipase -CXR, tib/fib film, and A/P CT were all negative -Continue pain management, supportive care -He has exhibited drug-seeking behavior in the past and providing  narcotics. -Complains of back pain.  Will start on low-dose oxycodone, continue muscle relaxants -At baseline, he does not have any ambulatory problems  Hyperlipidemia- (present on admission) -Recent LDL was 246 as per 04/02/2021 -Statin was held due to recurrent pancreatitis -Check lipid panel tomorrow.  Will start on statin if LDL is still high  Marijuana abuse- (present on admission) -Some of his recurrent abdominal pain and n/v may be associated with cannabinoid hyperemesis syndrome -Encourage marijuana cessation and a good bowel regimen as well as behavioral health support -UDS  Type 2 diabetes mellitus with hyperglycemia, with long-term current use of insulin (HCC) -Last A1c was 7.7 -Continue current insulin regimen -Monitor blood sugars -Diabetic coordinator following        Subjective:  Patient seen and examined at the bedside this morning.  Remains hypertensive.  He looks overall comfortable but complains of back pain and requesting for pain medication.  Physical Exam: Vitals:   06/20/21 0500 06/20/21 0610 06/20/21 0713 06/20/21 0836  BP: (!) 192/103 (!) 195/102 (!) 203/98 (!) 195/106  Pulse: 94  86 89  Resp: 18  (!) 22   Temp: 98.6 F (37 C)  98.7 F (37.1 C)   TempSrc: Oral  Oral   SpO2: 97%  98%   Weight:      Height:       General exam: Overall comfortable, not in distress HEENT: PERRL, complains of poor vision Respiratory system:  no wheezes or crackles  Cardiovascular system: S1 & S2 heard, RRR.  Gastrointestinal system: Abdomen is nondistended, soft and nontender. Central nervous system: Alert and oriented Extremities: No edema, no clubbing ,no cyanosis Skin: No rashes, no ulcers,no icterus    Data Reviewed:  There are no new results to review  at this time.  Family Communication: None at the bedside  Disposition: Status is: Inpatient Remains inpatient appropriate because: Persistent hypertension, AKI     Planned Discharge Destination:  Home    Author: Burnadette Pop, MD 06/20/2021 9:09 AM  For on call review [email or web address].

## 2021-06-20 NOTE — Hospital Course (Signed)
Patient is a 41 year old male with history of hypertension, hyperlipidemia, diabetes type 2, recurrent pancreatitis who presented here with fall resulting in abdominal pain.  On presentation he was severely hypertensive with a systolic blood pressure in the range of 200s.  Lab work showed hyper glycemia, no evidence of DKA, lipase level normal.  CT abdomen/pelvis was unremarkable.  Lab work also showed AKI with elevated creatinine.  Patient was initially started on nicardipine drip for severe hypertension.  Hospital course remarkable for persistent hypertension, AKI.

## 2021-06-20 NOTE — TOC Initial Note (Signed)
Transition of Care (TOC) - Initial/Assessment Note    Patient Details  Name: Randy [name]. MRN: [id number] Date of Birth: 05-05-1981  [facility]) CM/SW Contact:    Beckie Busing, RN Phone [phone]  06/20/2021, 11:53 AM  Clinical Narrative:                 TOC following for patient identified as high risk for readmission.Patient states that he is from home with his mother. Patient states that he functions independently at home with no home health or DME needs. Patient states that he is active with [facility] Care on [facility]. Patient states that he currently receives his meds from [employer] and [employer] There are no TOC needs at this time. TOC will continue to follow for disposition needs.   Expected Discharge Plan: Home/Self Care Barriers to Discharge: Continued Medical Work up   Patient Goals and CMS Choice Patient states their goals for this hospitalization and ongoing recovery are:: Wants to get better to go home CMS Medicare.gov Compare Post Acute Care list provided to::  (n/a) Choice offered to / list presented to : NA  Expected Discharge Plan and Services Expected Discharge Plan: Home/Self Care In-house Referral: NA Discharge Planning Services: CM Consult Post Acute Care Choice: NA Living arrangements for the past 2 months: Apartment                 DME Arranged: N/A DME Agency: NA       HH Arranged: NA HH Agency: NA        Prior Living Arrangements/Services Living arrangements for the past 2 months: Apartment Lives with:: Parents Patient language and need for interpreter reviewed:: Yes Do you feel safe going back to the place where you live?: Yes      Need for Family Participation in Patient Care: Yes (Comment) Care giver support system in place?: Yes (comment) Current home services:  (n/a) Criminal Activity/Legal Involvement Pertinent to Current Situation/Hospitalization: No - Comment as needed  Activities of Daily  Living      Permission Sought/Granted   Permission granted to share information with : No              Emotional Assessment Appearance:: Appears stated age Attitude/Demeanor/Rapport: Gracious Affect (typically observed): Accepting, Pleasant Orientation: : Oriented to Self, Oriented to Place, Oriented to  Time, Oriented to Situation Alcohol / Substance Use: Not Applicable Psych Involvement: No (comment)  Admission diagnosis:  Hypertensive urgency [I16.0] Hypertensive emergency [I16.1] Acute pancreatitis, unspecified complication status, unspecified pancreatitis type [K85.90] Patient Active Problem List   Diagnosis Date Noted   Fall at home, initial encounter 06/18/2021   Acute pancreatitis 09/17/2020   Abnormal urinalysis 09/17/2020   Intractable nausea and vomiting 09/17/2020   Hypertensive crisis 09/12/2020   Nausea & vomiting 09/12/2020   AKI (acute kidney injury) (HCC) 09/12/2020   GERD (gastroesophageal reflux disease) 09/12/2020   Type 2 diabetes mellitus without complication, with long-term current use of insulin (HCC)    Hyperlipidemia 06/01/2019   Microalbuminuria due to type 2 diabetes mellitus (HCC) 06/01/2019   Essential hypertension 05/31/2019   Hypertensive urgency 08/31/2015   Type 2 diabetes mellitus with hyperglycemia, with long-term current use of insulin (HCC) 08/31/2015   Marijuana abuse 08/31/2015   PCP:  Rema Fendt, NP Pharmacy:   [facility] [address], [address] [place] [address] Phone: [phone] Fax: [phone]  [facility] [address] [place] ([place]), [place] -  [address] [place] ([place]) [place] [address] Phone: [phone] Fax: [phone]  [facility] [address] [place] [address] Phone: [phone] Fax: [phone]     Social Determinants of Health (SDOH) Interventions    Readmission Risk  Interventions Readmission Risk Prevention Plan 06/20/2021  Transportation Screening Complete  PCP or Specialist Appt within 3-5 Days Complete  HRI or Home Care Consult Complete  Social Work Consult for Recovery Care Planning/Counseling Complete  Palliative Care Screening Not Applicable  Medication Review Oceanographer) Referral to Pharmacy  Some recent data might be hidden

## 2021-06-21 ENCOUNTER — Other Ambulatory Visit (HOSPITAL_COMMUNITY): Payer: Self-pay

## 2021-06-21 LAB — LIPID PANEL
Cholesterol: 313 mg/dL — ABNORMAL HIGH (ref 0–200)
HDL: 32 mg/dL — ABNORMAL LOW (ref >40)
LDL Cholesterol: 235 mg/dL — ABNORMAL HIGH (ref 0–99)
Total CHOL/HDL Ratio: 9.8 RATIO
Triglycerides: 229 mg/dL — ABNORMAL HIGH (ref <150)
VLDL: 46 mg/dL — ABNORMAL HIGH (ref 0–40)

## 2021-06-21 LAB — BASIC METABOLIC PANEL
Anion gap: 7 (ref 5–15)
BUN: 17 mg/dL (ref 6–20)
CO2: 22 mmol/L (ref 22–32)
Calcium: 8.4 mg/dL — ABNORMAL LOW (ref 8.9–10.3)
Chloride: 103 mmol/L (ref 98–111)
Creatinine, Ser: 1.46 mg/dL — ABNORMAL HIGH (ref 0.61–1.24)
GFR, Estimated: >60 mL/min (ref >60)
Glucose, Bld: 120 mg/dL — ABNORMAL HIGH (ref 70–99)
Potassium: 3.6 mmol/L (ref 3.5–5.1)
Sodium: 132 mmol/L — ABNORMAL LOW (ref 135–145)

## 2021-06-21 LAB — GLUCOSE, CAPILLARY
Glucose-Capillary: 212 mg/dL — ABNORMAL HIGH (ref 70–99)
Glucose-Capillary: 140 mg/dL — ABNORMAL HIGH (ref 70–99)

## 2021-06-21 MED ORDER — ACCU-CHEK GUIDE VI STRP
ORAL_STRIP | 0 refills | Status: DC
  Filled 2021-06-21: qty 100, 25d supply, fill #0

## 2021-06-21 MED ORDER — INSULIN GLARGINE 100 UNIT/ML SOLOSTAR PEN
30.0000 [IU] | PEN_INJECTOR | Freq: Every day | SUBCUTANEOUS | 1 refills | Status: DC
  Filled 2021-06-21: qty 9, 30d supply, fill #0
  Filled 2021-07-31: qty 9, 30d supply, fill #1

## 2021-06-21 MED ORDER — ACCU-CHEK GUIDE W/DEVICE KIT
1.0000 | PACK | Freq: Two times a day (BID) | 0 refills | Status: DC
  Filled 2021-06-21: qty 1, 30d supply, fill #0

## 2021-06-21 MED ORDER — ACCU-CHEK SOFTCLIX LANCETS MISC
0 refills | Status: DC
  Filled 2021-06-21: qty 100, 25d supply, fill #0

## 2021-06-21 MED ORDER — CYCLOBENZAPRINE HCL 5 MG PO TABS
7.5000 mg | ORAL_TABLET | Freq: Three times a day (TID) | ORAL | 0 refills | Status: DC | PRN
  Filled 2021-06-21: qty 32, 7d supply, fill #0

## 2021-06-21 MED ORDER — ATORVASTATIN CALCIUM 80 MG PO TABS
80.0000 mg | ORAL_TABLET | Freq: Every day | ORAL | 1 refills | Status: DC
  Filled 2021-06-21 – 2021-07-31 (×2): qty 30, 30d supply, fill #0
  Filled 2021-07-31: qty 30, 30d supply, fill #1

## 2021-06-21 MED ORDER — ATORVASTATIN CALCIUM 80 MG PO TABS
80.0000 mg | ORAL_TABLET | Freq: Every day | ORAL | Status: DC
  Administered 2021-06-21: 80 mg via ORAL
  Filled 2021-06-21: qty 1

## 2021-06-21 MED ORDER — CARVEDILOL 25 MG PO TABS
25.0000 mg | ORAL_TABLET | Freq: Two times a day (BID) | ORAL | 1 refills | Status: DC
  Filled 2021-06-21 – 2021-07-31 (×2): qty 60, 30d supply, fill #0
  Filled 2021-07-31: qty 60, 30d supply, fill #1

## 2021-06-21 MED ORDER — HYDRALAZINE HCL 100 MG PO TABS
100.0000 mg | ORAL_TABLET | Freq: Three times a day (TID) | ORAL | 1 refills | Status: DC
  Filled 2021-06-21 – 2021-07-31 (×2): qty 90, 30d supply, fill #0
  Filled 2021-07-31: qty 90, 30d supply, fill #1

## 2021-06-21 MED ORDER — AMLODIPINE BESYLATE 10 MG PO TABS
10.0000 mg | ORAL_TABLET | Freq: Every day | ORAL | 1 refills | Status: DC
  Filled 2021-06-21 – 2021-07-31 (×2): qty 30, 30d supply, fill #0
  Filled 2021-07-31: qty 30, 30d supply, fill #1

## 2021-06-21 MED ORDER — OMEPRAZOLE 40 MG PO CPDR
40.0000 mg | DELAYED_RELEASE_CAPSULE | Freq: Every day | ORAL | 0 refills | Status: DC
  Filled 2021-06-21: qty 30, 30d supply, fill #0

## 2021-06-21 MED ORDER — HYDRALAZINE HCL 50 MG PO TABS: 100.0000 mg | ORAL_TABLET | Freq: Three times a day (TID) | ORAL | Status: DC

## 2021-06-21 MED ORDER — HYDRALAZINE HCL 50 MG PO TABS
100.0000 mg | ORAL_TABLET | Freq: Three times a day (TID) | ORAL | Status: DC
  Administered 2021-06-21: 100 mg via ORAL
  Filled 2021-06-21: qty 2

## 2021-06-21 MED ORDER — TRUE METRIX METER W/DEVICE KIT
PACK | 0 refills | Status: DC
  Filled 2021-06-21: qty 1, fill #0

## 2021-06-21 NOTE — TOC Progression Note (Addendum)
Transition of Care (TOC) - Progression Note    Patient Details  Name: Randy [name]. MRN: [id number] Date of Birth: 11-11-1980  Transition of Care [facility]) CM/SW Contact  Beckie Busing, RN Phone Number:[phone]  06/21/2021, 9:43 AM  Clinical Narrative:    CM received message from MD stating that patient will be discharged today and scripts have been sent to [facility] pharmacy. MATCH form has been completed to provide uninsured patient with 30 day supply of meds prior to discharge. MD is also requesting PCP for patient. CM has called [facility] who confirms that patient is active with Primary Care at [facility] with [name] NP. CM attempted to call Primary Care on [facility] but received voicemail. Voicemail has been left requesting return call to set up hospital follow up. CM will await return call.   1252 CM attempted to call Primary Care at [facility] square to schedule hospital follow up. Office is closed for lunch. Second message left will await return call.   1520 Follow up appointment has been schedules at Primary Care at [facility]. Info has been added to AVS.    Expected Discharge Plan: Home/Self Care Barriers to Discharge: Continued Medical Work up  Expected Discharge Plan and Services Expected Discharge Plan: Home/Self Care In-house Referral: NA Discharge Planning Services: CM Consult Post Acute Care Choice: NA Living arrangements for the past 2 months: Apartment                 DME Arranged: N/A DME Agency: NA       HH Arranged: NA HH Agency: NA         Social Determinants of Health (SDOH) Interventions    Readmission Risk Interventions Readmission Risk Prevention Plan 06/20/2021  Transportation Screening Complete  PCP or Specialist Appt within 3-5 Days Complete  HRI or Home Care Consult Complete  Social Work Consult for Recovery Care Planning/Counseling Complete  Palliative Care Screening Not Applicable  Medication Review Oceanographer)  Referral to Pharmacy  Some recent data might be hidden

## 2021-06-21 NOTE — Discharge Summary (Signed)
Physician Discharge Summary  [place]. ENI:[id number] DOB: 12-05-1980 DOA: 06/18/2021  PCP: Camillia Herter, NP  Admit date: 06/18/2021 Discharge date: 06/21/2021  Admitted From: Home Disposition:  Home  Discharge Condition:Stable CODE STATUS:FULL Diet recommendation: Heart Healthy  Brief/Interim Summary:  Patient is a 41 year old male with history of hypertension, hyperlipidemia, diabetes type 2, recurrent pancreatitis who presented here with fall resulting in abdominal pain.  On presentation he was severely hypertensive with a systolic blood pressure in the range of 200s.  Lab work showed hyperglycemia, no evidence of DKA, lipase level normal.  CT abdomen/pelvis was unremarkable.  Lab work also showed AKI with elevated creatinine.  Patient was initially started on nicardipine drip for severe hypertension.  Hospital course remarkable for persistent hypertension, AKI.  Patient was started on IV fluids, kidney function near baseline.  Hypertension is much better now with titration of medications.  Patient is medically stable for discharge to home today.  Following problems were addressed during his hospitalization:  Hypertensive urgency- (present on admission) -Patient with poor home BP control presenting with uncontrolled BP related to noncompliance -Initially started on Nicardipine drip,now on Norvasc ,Coreg,hydralazine with better control of BP   AKI (acute kidney injury) ([place])- (present on admission) - Patient's last creatinine was 1.2 on 12/7.  On reviewing his previous labs, his creatinine has trended up to 2.4 as an 04/02/2021-CT abdomen/pelvis did not show any hydronephrosis, unremarkable kidneys, normal bladder -Kidney function much better today  Fall at home, initial encounter -Patient with report of diffuse pain from fall without obvious injuries, also with c/o "pancreas" pain with negative lipase -CXR, tib/fib film, and A/P CT were all negative -Continue pain management,  supportive care --At baseline, he does not have any ambulatory problems   Hyperlipidemia- (present on admission) -LDL of 235 -LDL goal previously elevated in the range of 200s but he was not taking medication for the fear of pancreatitis -Started on Lipitor 80 mg daily   Marijuana abuse- (present on admission) -Some of his recurrent abdominal pain and n/v may be associated with cannabinoid hyperemesis syndrome -Abdominal pain has resolved  Type 2 diabetes mellitus with hyperglycemia, with long-term current use of insulin (HCC) -Last A1c was 7.7 -Continue current insulin regimen -Monitor blood sugars        Discharge Diagnoses:  Principal Problem:   Hypertensive urgency Active Problems:   AKI (acute kidney injury) ([place])   Type 2 diabetes mellitus with hyperglycemia, with long-term current use of insulin ([place])   Marijuana abuse   Hyperlipidemia   Fall at home, initial encounter    Discharge Instructions  Discharge Instructions     Diet - low sodium heart healthy   Complete by: As directed    Discharge instructions   Complete by: As directed    1)Please take prescribed medications as instructed 2)Follow up with your PCP in a week 3)Monitor blood glucose and blood sugars at home   Increase activity slowly   Complete by: As directed    No wound care   Complete by: As directed       Allergies as of 06/21/2021       Reactions   Tomato    unknown        Medication List     TAKE these medications    amLODipine 10 MG tablet Commonly known as: NORVASC Take 1 tablet (10 mg total) by mouth daily.   atorvastatin 80 MG tablet Commonly known as: LIPITOR Take 1 tablet (80 mg total)  by mouth daily. Start taking on: June 22, 2021   Blood Pressure Kit 1 each by Does not apply route in the morning and at bedtime.   carvedilol 25 MG tablet Commonly known as: COREG Take 1 tablet (25 mg total) by mouth 2 (two) times daily with a meal.   cyclobenzaprine 7.5  MG tablet Commonly known as: FEXMID Take 1 tablet (7.5 mg total) by mouth 3 (three) times daily as needed for muscle spasms.   hydrALAZINE 100 MG tablet Commonly known as: APRESOLINE Take 1 tablet (100 mg total) by mouth every 8 (eight) hours.   insulin glargine 100 UNIT/ML Solostar Pen Commonly known as: LANTUS Inject 30 Units into the skin daily.   Insulin Pen Needle 31G X 8 MM Misc use as directed   omeprazole 40 MG capsule Commonly known as: PRILOSEC Take 1 capsule (40 mg total) by mouth daily.   ondansetron 4 MG tablet Commonly known as: ZOFRAN Take 1 tablet (4 mg total) by mouth every 6 (six) hours as needed for nausea or vomiting.   sildenafil 100 MG tablet Commonly known as: VIAGRA TAKE 0.5-1 TABLETS (50-100 MG TOTAL) BY MOUTH DAILY AS NEEDED FOR ERECTILE DYSFUNCTION. What changed:  how much to take how to take this when to take this reasons to take this   True Metrix Blood Glucose Test test strip Generic drug: glucose blood Use as instructed   True Metrix Meter w/Device Kit Use as directed   TRUEplus Lancets 28G Misc Use as directed        Follow-up Information     Camillia Herter, NP. Schedule an appointment as soon as possible for a visit in 1 week(s).   Specialty: Nurse Practitioner Contact information: [place] [address] [phone]                Allergies  Allergen Reactions   Tomato     unknown    Consultations: None   Procedures/Studies: DG Chest 2 View  Result Date: 06/18/2021 CLINICAL DATA:  Fall, concern for left-sided rib fracture. EXAM: CHEST - 2 VIEW COMPARISON:  04/08/2020. FINDINGS: The heart size and mediastinal contours are within normal limits. Lung volumes are low. No consolidation, effusion, or pneumothorax. No obvious rib fracture or other acute osseous abnormality. IMPRESSION: No active cardiopulmonary disease. Electronically Signed   By: Brett Fairy M.D.   On: 06/18/2021 01:27    DG Tibia/Fibula Left  Result Date: 06/18/2021 CLINICAL DATA:  Fall, hit mid shin.  Rule out fracture. EXAM: LEFT TIBIA AND FIBULA - 2 VIEW COMPARISON:  None. FINDINGS: There is no evidence of fracture or other focal bone lesions. Vascular calcifications are present in the soft tissues. IMPRESSION: No acute fracture or dislocation. Electronically Signed   By: Brett Fairy M.D.   On: 06/18/2021 01:25   CT ABDOMEN PELVIS W CONTRAST  Result Date: 06/18/2021 CLINICAL DATA:  Left lower quadrant abdominal pain, trauma EXAM: CT ABDOMEN AND PELVIS WITH CONTRAST TECHNIQUE: Multidetector CT imaging of the abdomen and pelvis was performed using the standard protocol following bolus administration of intravenous contrast. RADIATION DOSE REDUCTION: This exam was performed according to the departmental dose-optimization program which includes automated exposure control, adjustment of the mA and/or kV according to patient size and/or use of iterative reconstruction technique. CONTRAST:  133m OMNIPAQUE IOHEXOL 300 MG/ML  SOLN COMPARISON:  04/02/2021 FINDINGS: Lower chest: No acute abnormality. Hepatobiliary: No focal liver abnormality is seen. No gallstones, gallbladder wall thickening, or biliary dilatation.  Pancreas: Unremarkable. No pancreatic ductal dilatation or surrounding inflammatory changes. Spleen: Normal in size without focal abnormality. Adrenals/Urinary Tract: Adrenal glands are unremarkable. Kidneys are normal, without renal calculi, focal lesion, or hydronephrosis. Bladder is unremarkable. Stomach/Bowel: Stomach is within normal limits. Appendix appears normal. No evidence of bowel wall thickening, distention, or inflammatory changes. Vascular/Lymphatic: No significant vascular findings are present. No enlarged abdominal or pelvic lymph nodes. Reproductive: Prostate is unremarkable. Other: No free fluid or free air in the abdomen or pelvis. No significant soft tissue injury. Musculoskeletal: No acute osseous  abnormality. IMPRESSION: No acute process in the abdomen or pelvis. Electronically Signed   By: Merilyn Baba M.D.   On: 06/18/2021 02:11      Subjective: Patient seen and examined at bedside this morning.  Hemodynamically stable for discharge today  Discharge Exam: Vitals:   06/21/21 0900 06/21/21 1235  BP: (!) 180/106 140/84  Pulse: 81 81  Resp: 18 19  Temp: 98.2 F (36.8 C) 98.2 F (36.8 C)  SpO2: 99% 98%   Vitals:   06/21/21 0700 06/21/21 0734 06/21/21 0900 06/21/21 1235  BP: (!) 181/105 (!) 180/97 (!) 180/106 140/84  Pulse:   81 81  Resp:   18 19  Temp:   98.2 F (36.8 C) 98.2 F (36.8 C)  TempSrc:   Oral Oral  SpO2:   99% 98%  Weight:      Height:        General: Pt is alert, awake, not in acute distress Cardiovascular: RRR, S1/S2 +, no rubs, no gallops Respiratory: CTA bilaterally, no wheezing, no rhonchi Abdominal: Soft, NT, ND, bowel sounds + Extremities: no edema, no cyanosis    The results of significant diagnostics from this hospitalization (including imaging, microbiology, ancillary and laboratory) are listed below for reference.     Microbiology: Recent Results (from the past 240 hour(s))  MRSA Next Gen by PCR, Nasal     Status: None   Collection Time: 06/18/21  8:30 PM   Specimen: Nasal Mucosa; Nasal Swab  Result Value Ref Range Status   MRSA by PCR Next Gen NOT DETECTED NOT DETECTED Final    Comment: (NOTE) The GeneXpert MRSA Assay (FDA approved for NASAL specimens only), is one component of a comprehensive MRSA colonization surveillance program. It is not intended to diagnose MRSA infection nor to guide or monitor treatment for MRSA infections. Test performance is not FDA approved in patients less than 22 years old. Performed at [facility], [place] [address], [place], [place] [address]   Resp Panel by RT-PCR (Flu A&B, Covid) Nasopharyngeal Swab     Status: None   Collection Time: 06/19/21  4:46 PM   Specimen: Nasopharyngeal Swab;  Nasopharyngeal(NP) swabs in vial transport medium  Result Value Ref Range Status   SARS Coronavirus 2 by RT PCR NEGATIVE NEGATIVE Final    Comment: (NOTE) SARS-CoV-2 target nucleic acids are NOT DETECTED.  The SARS-CoV-2 RNA is generally detectable in upper respiratory specimens during the acute phase of infection. The lowest concentration of SARS-CoV-2 viral copies this assay can detect is 138 copies/mL. A negative result does not preclude SARS-Cov-2 infection and should not be used as the sole basis for treatment or other patient management decisions. A negative result may occur with  improper specimen collection/handling, submission of specimen other than nasopharyngeal swab, presence of viral mutation(s) within the areas targeted by this assay, and inadequate number of viral copies(<138 copies/mL). A negative result must be combined with clinical observations, patient history,  and epidemiological information. The expected result is Negative.  Fact Sheet for Patients:  EntrepreneurPulse.com.au  Fact Sheet for Healthcare Providers:  IncredibleEmployment.be  This test is no t yet approved or cleared by the [place] FDA and  has been authorized for detection and/or diagnosis of SARS-CoV-2 by FDA under an Emergency Use Authorization (EUA). This EUA will remain  in effect (meaning this test can be used) for the duration of the COVID-19 declaration under Section 564(b)(1) of the Act, 21 U.S.C.section 360bbb-3(b)(1), unless the authorization is terminated  or revoked sooner.       Influenza A by PCR NEGATIVE NEGATIVE Final   Influenza B by PCR NEGATIVE NEGATIVE Final    Comment: (NOTE) The Xpert Xpress SARS-CoV-2/FLU/RSV plus assay is intended as an aid in the diagnosis of influenza from Nasopharyngeal swab specimens and should not be used as a sole basis for treatment. Nasal washings and aspirates are unacceptable for Xpert Xpress  SARS-CoV-2/FLU/RSV testing.  Fact Sheet for Patients: EntrepreneurPulse.com.au  Fact Sheet for Healthcare Providers: IncredibleEmployment.be  This test is not yet approved or cleared by the [place] FDA and has been authorized for detection and/or diagnosis of SARS-CoV-2 by FDA under an Emergency Use Authorization (EUA). This EUA will remain in effect (meaning this test can be used) for the duration of the COVID-19 declaration under Section 564(b)(1) of the Act, 21 U.S.C. section 360bbb-3(b)(1), unless the authorization is terminated or revoked.  Performed at [facility], [place] [address] [place], [place] [address]      Labs: BNP (last 3 results) Recent Labs    06/18/21 0606  BNP 754.4*   Basic Metabolic Panel: Recent Labs  Lab 06/18/21 0048 06/19/21 0605 06/20/21 0139 06/21/21 0436  NA 132* 136  --  132*  K 4.2 4.1  --  3.6  CL 102 101  --  103  CO2 24 24  --  22  GLUCOSE 328* 225*  --  120*  BUN 17 21*  --  17  CREATININE 1.55* 1.69* 2.02* 1.46*  CALCIUM 8.1* 9.1  --  8.4*   Liver Function Tests: Recent Labs  Lab 06/18/21 0048  AST 17  ALT 16  ALKPHOS 123  BILITOT 0.5  PROT 6.2*  ALBUMIN 2.8*   Recent Labs  Lab 06/18/21 0048  LIPASE 52*   No results for input(s): AMMONIA in the last 168 hours. CBC: Recent Labs  Lab 06/18/21 0048 06/19/21 0605  WBC 7.8 10.4  NEUTROABS 3.7  --   HGB 10.2* 11.3*  HCT 29.9* 33.9*  MCV 84.9 81.9  PLT 258 336   Cardiac Enzymes: No results for input(s): CKTOTAL, CKMB, CKMBINDEX, TROPONINI in the last 168 hours. BNP: Invalid input(s): POCBNP CBG: Recent Labs  Lab 06/20/21 1119 06/20/21 1635 06/20/21 2111 06/21/21 0642 06/21/21 1234  GLUCAP 235* 110* 125* 212* 140*   D-Dimer No results for input(s): DDIMER in the last 72 hours. Hgb A1c No results for input(s): HGBA1C in the last 72 hours. Lipid Profile Recent Labs    06/21/21 0436  CHOL 313*  HDL  32*  LDLCALC 235*  TRIG 229*  CHOLHDL 9.8   Thyroid function studies No results for input(s): TSH, T4TOTAL, T3FREE, THYROIDAB in the last 72 hours.  Invalid input(s): FREET3 Anemia work up No results for input(s): VITAMINB12, FOLATE, FERRITIN, TIBC, IRON, RETICCTPCT in the last 72 hours. Urinalysis    Component Value Date/Time   COLORURINE YELLOW 06/18/2021 [place] 06/18/2021 0240  LABSPEC 1.015 06/18/2021 0240   PHURINE 7.0 06/18/2021 0240   GLUCOSEU >=500 (A) 06/18/2021 0240   HGBUR SMALL (A) 06/18/2021 0240   BILIRUBINUR NEGATIVE 06/18/2021 0240   KETONESUR NEGATIVE 06/18/2021 0240   PROTEINUR >300 (A) 06/18/2021 0240   UROBILINOGEN 1.0 10/19/2013 2130   NITRITE NEGATIVE 06/18/2021 0240   LEUKOCYTESUR NEGATIVE 06/18/2021 0240   Sepsis Labs Invalid input(s): PROCALCITONIN,  WBC,  LACTICIDVEN Microbiology Recent Results (from the past 240 hour(s))  MRSA Next Gen by PCR, Nasal     Status: None   Collection Time: 06/18/21  8:30 PM   Specimen: Nasal Mucosa; Nasal Swab  Result Value Ref Range Status   MRSA by PCR Next Gen NOT DETECTED NOT DETECTED Final    Comment: (NOTE) The GeneXpert MRSA Assay (FDA approved for NASAL specimens only), is one component of a comprehensive MRSA colonization surveillance program. It is not intended to diagnose MRSA infection nor to guide or monitor treatment for MRSA infections. Test performance is not FDA approved in patients less than 8 years old. Performed at [facility], [place] [address], [place], [place] [address]   Resp Panel by RT-PCR (Flu A&B, Covid) Nasopharyngeal Swab     Status: None   Collection Time: 06/19/21  4:46 PM   Specimen: Nasopharyngeal Swab; Nasopharyngeal(NP) swabs in vial transport medium  Result Value Ref Range Status   SARS Coronavirus 2 by RT PCR NEGATIVE NEGATIVE Final    Comment: (NOTE) SARS-CoV-2 target nucleic acids are NOT DETECTED.  The SARS-CoV-2 RNA is generally detectable in  upper respiratory specimens during the acute phase of infection. The lowest concentration of SARS-CoV-2 viral copies this assay can detect is 138 copies/mL. A negative result does not preclude SARS-Cov-2 infection and should not be used as the sole basis for treatment or other patient management decisions. A negative result may occur with  improper specimen collection/handling, submission of specimen other than nasopharyngeal swab, presence of viral mutation(s) within the areas targeted by this assay, and inadequate number of viral copies(<138 copies/mL). A negative result must be combined with clinical observations, patient history, and epidemiological information. The expected result is Negative.  Fact Sheet for Patients:  EntrepreneurPulse.com.au  Fact Sheet for Healthcare Providers:  IncredibleEmployment.be  This test is no t yet approved or cleared by the [place] FDA and  has been authorized for detection and/or diagnosis of SARS-CoV-2 by FDA under an Emergency Use Authorization (EUA). This EUA will remain  in effect (meaning this test can be used) for the duration of the COVID-19 declaration under Section 564(b)(1) of the Act, 21 U.S.C.section 360bbb-3(b)(1), unless the authorization is terminated  or revoked sooner.       Influenza A by PCR NEGATIVE NEGATIVE Final   Influenza B by PCR NEGATIVE NEGATIVE Final    Comment: (NOTE) The Xpert Xpress SARS-CoV-2/FLU/RSV plus assay is intended as an aid in the diagnosis of influenza from Nasopharyngeal swab specimens and should not be used as a sole basis for treatment. Nasal washings and aspirates are unacceptable for Xpert Xpress SARS-CoV-2/FLU/RSV testing.  Fact Sheet for Patients: EntrepreneurPulse.com.au  Fact Sheet for Healthcare Providers: IncredibleEmployment.be  This test is not yet approved or cleared by the [place] FDA and has been  authorized for detection and/or diagnosis of SARS-CoV-2 by FDA under an Emergency Use Authorization (EUA). This EUA will remain in effect (meaning this test can be used) for the duration of the COVID-19 declaration under Section 564(b)(1) of the Act, 21 U.S.C. section 360bbb-3(b)(1),  unless the authorization is terminated or revoked.  Performed at [facility], [place] [address], [place], [place] [address]     Please note: You were cared for by a hospitalist during your hospital stay. Once you are discharged, your primary care physician will handle any further medical issues. Please note that NO REFILLS for any discharge medications will be authorized once you are discharged, as it is imperative that you return to your primary care physician (or establish a relationship with a primary care physician if you do not have one) for your post hospital discharge needs so that they can reassess your need for medications and monitor your lab values.    Time coordinating discharge: 40 minutes  SIGNED:   Shelly Coss, MD  [facility] 06/21/2021, 12:57 PM Pager [phone]  If 7PM-7AM, please contact night-coverage [email or web address] Password [email or web address]

## 2021-06-22 ENCOUNTER — Telehealth: Payer: Self-pay

## 2021-06-22 NOTE — Telephone Encounter (Signed)
Transition Care Management Follow-up Telephone Call Date of discharge and from where: [facility] 06/22/2021 How have you been since you were released from the hospital? Feeling good  Any questions or concerns? No  Items Reviewed: Did the pt receive and understand the discharge instructions provided? Yes  Medications obtained and verified? Yes  Other? Yes  pt stated he prefers to have a television at this time. Any new allergies since your discharge? No  Dietary orders reviewed? Yes Do you have support at home? Yes  wife   Home Care and Equipment/Supplies: Were home health services ordered? no If so, what is the name of the agency?  Has the agency set up a time to come to the patient's home? not applicable Were any new equipment or medical supplies ordered?  No What is the name of the medical supply agency?  Were you able to get the supplies/equipment? not applicable Do you have any questions related to the use of the equipment or supplies? No  Functional Questionnaire: (I = Independent and D = Dependent) ADLs: Self   Follow up appointments reviewed:  PCP Hospital f/u appt confirmed? Yes  Scheduled to see NP Durene Fruits on 06/25/2021 @ 10:50am Specialist Hospital f/u appt confirmed?  Yes  Are transportation arrangements needed? No  If their condition worsens, is the pt aware to call PCP or go to the Emergency Dept.? Yes Was the patient provided with contact information for the PCP's office or ED? Yes Was to pt encouraged to call back with questions or concerns? Yes

## 2021-06-24 NOTE — Progress Notes (Signed)
Erroneous encounter

## 2021-06-25 ENCOUNTER — Encounter: Payer: Self-pay | Admitting: Family

## 2021-06-25 ENCOUNTER — Other Ambulatory Visit: Payer: Self-pay

## 2021-06-25 DIAGNOSIS — W19XXXD Unspecified fall, subsequent encounter: Secondary | ICD-10-CM

## 2021-06-25 DIAGNOSIS — E785 Hyperlipidemia, unspecified: Secondary | ICD-10-CM

## 2021-06-25 DIAGNOSIS — I160 Hypertensive urgency: Secondary | ICD-10-CM

## 2021-06-25 DIAGNOSIS — K859 Acute pancreatitis without necrosis or infection, unspecified: Secondary | ICD-10-CM

## 2021-06-25 DIAGNOSIS — F1210 Cannabis abuse, uncomplicated: Secondary | ICD-10-CM

## 2021-06-25 DIAGNOSIS — N179 Acute kidney failure, unspecified: Secondary | ICD-10-CM

## 2021-06-25 DIAGNOSIS — E1165 Type 2 diabetes mellitus with hyperglycemia: Secondary | ICD-10-CM

## 2021-06-25 DIAGNOSIS — Z09 Encounter for follow-up examination after completed treatment for conditions other than malignant neoplasm: Secondary | ICD-10-CM

## 2021-07-03 ENCOUNTER — Inpatient Hospital Stay (INDEPENDENT_AMBULATORY_CARE_PROVIDER_SITE_OTHER): Payer: Self-pay | Admitting: Primary Care

## 2021-07-03 NOTE — Progress Notes (Signed)
Erroneous encounter

## 2021-07-04 ENCOUNTER — Encounter: Payer: Self-pay | Admitting: Family

## 2021-07-04 DIAGNOSIS — F1210 Cannabis abuse, uncomplicated: Secondary | ICD-10-CM

## 2021-07-04 DIAGNOSIS — N179 Acute kidney failure, unspecified: Secondary | ICD-10-CM

## 2021-07-04 DIAGNOSIS — I10 Essential (primary) hypertension: Secondary | ICD-10-CM

## 2021-07-04 DIAGNOSIS — E1165 Type 2 diabetes mellitus with hyperglycemia: Secondary | ICD-10-CM

## 2021-07-04 DIAGNOSIS — W19XXXD Unspecified fall, subsequent encounter: Secondary | ICD-10-CM

## 2021-07-04 DIAGNOSIS — Z09 Encounter for follow-up examination after completed treatment for conditions other than malignant neoplasm: Secondary | ICD-10-CM

## 2021-07-04 DIAGNOSIS — E785 Hyperlipidemia, unspecified: Secondary | ICD-10-CM

## 2021-07-04 DIAGNOSIS — Z0289 Encounter for other administrative examinations: Secondary | ICD-10-CM

## 2021-07-04 NOTE — Progress Notes (Signed)
Erroneous encounter

## 2021-07-11 ENCOUNTER — Encounter: Payer: Self-pay | Admitting: Family

## 2021-07-11 DIAGNOSIS — E785 Hyperlipidemia, unspecified: Secondary | ICD-10-CM

## 2021-07-11 DIAGNOSIS — Z0289 Encounter for other administrative examinations: Secondary | ICD-10-CM

## 2021-07-11 DIAGNOSIS — N179 Acute kidney failure, unspecified: Secondary | ICD-10-CM

## 2021-07-11 DIAGNOSIS — E119 Type 2 diabetes mellitus without complications: Secondary | ICD-10-CM

## 2021-07-11 DIAGNOSIS — Z09 Encounter for follow-up examination after completed treatment for conditions other than malignant neoplasm: Secondary | ICD-10-CM

## 2021-07-11 DIAGNOSIS — I10 Essential (primary) hypertension: Secondary | ICD-10-CM

## 2021-07-11 DIAGNOSIS — F1210 Cannabis abuse, uncomplicated: Secondary | ICD-10-CM

## 2021-07-11 DIAGNOSIS — W19XXXD Unspecified fall, subsequent encounter: Secondary | ICD-10-CM

## 2021-07-31 ENCOUNTER — Other Ambulatory Visit: Payer: Self-pay | Admitting: Family

## 2021-07-31 ENCOUNTER — Other Ambulatory Visit: Payer: Self-pay

## 2021-07-31 ENCOUNTER — Other Ambulatory Visit (HOSPITAL_COMMUNITY): Payer: Self-pay

## 2021-08-01 ENCOUNTER — Emergency Department (HOSPITAL_COMMUNITY): Payer: Self-pay

## 2021-08-01 ENCOUNTER — Telehealth: Payer: Self-pay | Admitting: *Deleted

## 2021-08-01 ENCOUNTER — Other Ambulatory Visit: Payer: Self-pay

## 2021-08-01 ENCOUNTER — Encounter (HOSPITAL_COMMUNITY): Payer: Self-pay | Admitting: Emergency Medicine

## 2021-08-01 ENCOUNTER — Emergency Department (HOSPITAL_COMMUNITY)
Admission: EM | Admit: 2021-08-01 | Discharge: 2021-08-01 | Disposition: A | Discharge status: Home / Self Care | Payer: Self-pay | Attending: Emergency Medicine | Admitting: Emergency Medicine

## 2021-08-01 DIAGNOSIS — Z79899 Other long term (current) drug therapy: Secondary | ICD-10-CM | POA: Insufficient documentation

## 2021-08-01 DIAGNOSIS — R112 Nausea with vomiting, unspecified: Secondary | ICD-10-CM | POA: Insufficient documentation

## 2021-08-01 DIAGNOSIS — Z794 Long term (current) use of insulin: Secondary | ICD-10-CM | POA: Insufficient documentation

## 2021-08-01 DIAGNOSIS — Z20822 Contact with and (suspected) exposure to covid-19: Secondary | ICD-10-CM | POA: Insufficient documentation

## 2021-08-01 DIAGNOSIS — R5383 Other fatigue: Secondary | ICD-10-CM | POA: Insufficient documentation

## 2021-08-01 DIAGNOSIS — E119 Type 2 diabetes mellitus without complications: Secondary | ICD-10-CM | POA: Insufficient documentation

## 2021-08-01 DIAGNOSIS — R109 Unspecified abdominal pain: Secondary | ICD-10-CM

## 2021-08-01 DIAGNOSIS — I10 Essential (primary) hypertension: Secondary | ICD-10-CM | POA: Insufficient documentation

## 2021-08-01 DIAGNOSIS — R1013 Epigastric pain: Secondary | ICD-10-CM | POA: Insufficient documentation

## 2021-08-01 LAB — CBC WITH DIFFERENTIAL/PLATELET
Abs Immature Granulocytes: 0.02 10*3/uL (ref 0.00–0.07)
Basophils Absolute: 0.1 10*3/uL (ref 0.0–0.1)
Basophils Relative: 2 %
Eosinophils Absolute: 0.1 10*3/uL (ref 0.0–0.5)
Eosinophils Relative: 2 %
HCT: 29.7 % — ABNORMAL LOW (ref 39.0–52.0)
Hemoglobin: 9.4 g/dL — ABNORMAL LOW (ref 13.0–17.0)
Immature Granulocytes: 0 %
Lymphocytes Relative: 37 %
Lymphs Abs: 2.2 10*3/uL (ref 0.7–4.0)
MCH: 27.4 pg (ref 26.0–34.0)
MCHC: 31.6 g/dL (ref 30.0–36.0)
MCV: 86.6 fL (ref 80.0–100.0)
Monocytes Absolute: 0.5 10*3/uL (ref 0.1–1.0)
Monocytes Relative: 9 %
Neutro Abs: 2.9 10*3/uL (ref 1.7–7.7)
Neutrophils Relative %: 50 %
Platelets: 337 10*3/uL (ref 150–400)
RBC: 3.43 MIL/uL — ABNORMAL LOW (ref 4.22–5.81)
RDW: 12.5 % (ref 11.5–15.5)
WBC: 5.8 10*3/uL (ref 4.0–10.5)
nRBC: 0 % (ref 0.0–0.2)

## 2021-08-01 LAB — RESP PANEL BY RT-PCR (FLU A&B, COVID) ARPGX2
Influenza A by PCR: NEGATIVE
Influenza B by PCR: NEGATIVE
SARS Coronavirus 2 by RT PCR: NEGATIVE

## 2021-08-01 LAB — COMPREHENSIVE METABOLIC PANEL
ALT: 15 U/L (ref 0–44)
AST: 17 U/L (ref 15–41)
Albumin: 3 g/dL — ABNORMAL LOW (ref 3.5–5.0)
Alkaline Phosphatase: 121 U/L (ref 38–126)
Anion gap: 9 (ref 5–15)
BUN: 32 mg/dL — ABNORMAL HIGH (ref 6–20)
CO2: 22 mmol/L (ref 22–32)
Calcium: 8.5 mg/dL — ABNORMAL LOW (ref 8.9–10.3)
Chloride: 103 mmol/L (ref 98–111)
Creatinine, Ser: 1.97 mg/dL — ABNORMAL HIGH (ref 0.61–1.24)
GFR, Estimated: 43 mL/min — ABNORMAL LOW (ref >60)
Glucose, Bld: 230 mg/dL — ABNORMAL HIGH (ref 70–99)
Potassium: 4.5 mmol/L (ref 3.5–5.1)
Sodium: 134 mmol/L — ABNORMAL LOW (ref 135–145)
Total Bilirubin: 0.5 mg/dL (ref 0.3–1.2)
Total Protein: 6.9 g/dL (ref 6.5–8.1)

## 2021-08-01 LAB — RAPID URINE DRUG SCREEN, HOSP PERFORMED
Amphetamines: NOT DETECTED
Barbiturates: NOT DETECTED
Benzodiazepines: NOT DETECTED
Cocaine: NOT DETECTED
Opiates: POSITIVE — AB
Tetrahydrocannabinol: POSITIVE — AB

## 2021-08-01 LAB — URINALYSIS, ROUTINE W REFLEX MICROSCOPIC
Bacteria, UA: NONE SEEN
Bilirubin Urine: NEGATIVE
Glucose, UA: 150 mg/dL — AB
Ketones, ur: NEGATIVE mg/dL
Leukocytes,Ua: NEGATIVE
Nitrite: NEGATIVE
Protein, ur: 100 mg/dL — AB
Specific Gravity, Urine: 1.01 (ref 1.005–1.030)
pH: 6 (ref 5.0–8.0)

## 2021-08-01 LAB — ETHANOL: Alcohol, Ethyl (B): <10 mg/dL (ref <10)

## 2021-08-01 LAB — AMMONIA: Ammonia: 51 umol/L — ABNORMAL HIGH (ref 9–35)

## 2021-08-01 LAB — LIPASE, BLOOD: Lipase: 39 U/L (ref 11–51)

## 2021-08-01 MED ORDER — LABETALOL HCL 5 MG/ML IV SOLN
5.0000 mg | Freq: Once | INTRAVENOUS | Status: AC
  Administered 2021-08-01: 5 mg via INTRAVENOUS
  Filled 2021-08-01: qty 4

## 2021-08-01 MED ORDER — ONDANSETRON HCL 4 MG/2ML IJ SOLN
4.0000 mg | Freq: Once | INTRAMUSCULAR | Status: AC
  Administered 2021-08-01: 4 mg via INTRAVENOUS
  Filled 2021-08-01: qty 2

## 2021-08-01 MED ORDER — HYDRALAZINE HCL 20 MG/ML IJ SOLN
10.0000 mg | Freq: Once | INTRAMUSCULAR | Status: AC
  Administered 2021-08-01: 10 mg via INTRAVENOUS
  Filled 2021-08-01: qty 1

## 2021-08-01 MED ORDER — OXYCODONE HCL 5 MG PO TABS: 5.0000 mg | ORAL_TABLET | ORAL | 0 refills | Status: DC | PRN

## 2021-08-01 MED ORDER — HYDRALAZINE HCL 25 MG PO TABS
50.0000 mg | ORAL_TABLET | Freq: Once | ORAL | Status: AC
  Administered 2021-08-01: 50 mg via ORAL
  Filled 2021-08-01: qty 2

## 2021-08-01 MED ORDER — AMLODIPINE BESYLATE 5 MG PO TABS
10.0000 mg | ORAL_TABLET | Freq: Once | ORAL | Status: AC
  Administered 2021-08-01: 10 mg via ORAL
  Filled 2021-08-01: qty 2

## 2021-08-01 MED ORDER — IOHEXOL 350 MG/ML SOLN
80.0000 mL | Freq: Once | INTRAVENOUS | Status: AC | PRN
  Administered 2021-08-01: 80 mL via INTRAVENOUS

## 2021-08-01 MED ORDER — MORPHINE SULFATE (PF) 4 MG/ML IV SOLN
4.0000 mg | Freq: Once | INTRAVENOUS | Status: AC
  Administered 2021-08-01: 4 mg via INTRAVENOUS
  Filled 2021-08-01: qty 1

## 2021-08-01 MED ORDER — ONDANSETRON HCL 4 MG PO TABS
4.0000 mg | ORAL_TABLET | Freq: Four times a day (QID) | ORAL | 0 refills | Status: DC | PRN
  Filled 2021-08-01: qty 12, 3d supply, fill #0

## 2021-08-01 MED ORDER — LACTATED RINGERS IV BOLUS
1000.0000 mL | Freq: Once | INTRAVENOUS | Status: AC
  Administered 2021-08-01: 1000 mL via INTRAVENOUS

## 2021-08-01 MED ORDER — FAMOTIDINE IN NACL 20-0.9 MG/50ML-% IV SOLN
20.0000 mg | Freq: Once | INTRAVENOUS | Status: AC
  Administered 2021-08-01: 20 mg via INTRAVENOUS
  Filled 2021-08-01: qty 50

## 2021-08-01 MED ORDER — HYDROCODONE-ACETAMINOPHEN 5-325 MG PO TABS
1.0000 | ORAL_TABLET | Freq: Once | ORAL | Status: AC
  Administered 2021-08-01: 1 via ORAL
  Filled 2021-08-01: qty 1

## 2021-08-01 MED ORDER — CARVEDILOL 12.5 MG PO TABS
25.0000 mg | ORAL_TABLET | Freq: Once | ORAL | Status: AC
  Administered 2021-08-01: 25 mg via ORAL
  Filled 2021-08-01: qty 2

## 2021-08-01 MED ORDER — OXYCODONE HCL 5 MG PO TABS
5.0000 mg | ORAL_TABLET | ORAL | 0 refills | Status: DC | PRN
  Filled 2021-08-01: qty 8, 2d supply, fill #0

## 2021-08-01 MED ORDER — SUCRALFATE 1 G PO TABS
1.0000 g | ORAL_TABLET | Freq: Three times a day (TID) | ORAL | 0 refills | Status: DC
  Filled 2021-08-01: qty 28, 7d supply, fill #0

## 2021-08-01 NOTE — ED Notes (Signed)
Pt verbalized understanding of d/c instructions, meds, and followup care. Denies questions. VSS, no distress noted. Steady gait to exit with all belongings.  ?

## 2021-08-01 NOTE — Discharge Instructions (Signed)
It was a pleasure caring for you today in the emergency department. ° °Please return to the emergency department for any worsening or worrisome symptoms. ° ° °

## 2021-08-01 NOTE — Telephone Encounter (Signed)
Pt called regarding Rx being sent to wrong pharmacy.  RNCM advised to pick up Rx (if possible) as his Rx is a narcotic and the prescribing EDP is no longer available.  RNCM also advised that if it is NOT possible, I would ask another EDP to re-route.  Pt will return a call if he can not retrieve Rx. ?

## 2021-08-01 NOTE — ED Triage Notes (Signed)
Pt BIB [facility] from home this morning with complaints of bilateral back pain with abdominal  pain L and R lower quadrants that started at 0600. Pt does report N/V. Complains of urinary frequency. BP elevated 220 systolic palpated with EMS. HR 80 SpO2-100%  ? ?

## 2021-08-01 NOTE — ED Provider Notes (Signed)
?[facility] EMERGENCY DEPARTMENT ?Provider Note ? ? ?CSN: [id number] ?Arrival date & time: 08/01/21  0827 ? ?  ? ?History ? ?Chief Complaint  ?Patient presents with  ? Back Pain  ? Abdominal Pain  ? ? ?Randy [name]. is a 41 y.o. male. ? ?This is a 41 y.o. male with significant medical history as below, including hyperlipidemia, hypertension, recurrent pancreatitis who presents to the ED with complaint of abdominal pain, concern for pancreatitis ? ?Location: Epigastric ?Duration: 2 to 3 days ?Onset: Gradual ?Timing: Constant ?Description: Sharp, stabbing, aching, burning, epigastric radiating to back ?Severity: Mild ?Exacerbating/Alleviating Factors: Worse after eating or ambulating, palpation.  Improved with Percocet and Motrin ?Associated Symptoms: Nausea, vomiting x1 this morning. ?Pertinent Negatives: No fevers or chills, no recent travel or sick contacts, no alcohol use, no illicit drug use.  Similar to prior episodes of pancreatitis. ? ?Was admitted last month with similar complaint, hypertensive emergency ? ?Of note patient did not take any of his antihypertensives this morning. ? ? ? ? ?Past Medical History: ?05/31/2019: Essential hypertension ?No date: HLD (hyperlipidemia) ?No date: Pancreatitis ?No date: Type II diabetes mellitus (HCC) ? ?Past Surgical History: ?No date: NO PAST SURGERIES  ? ? ?The history is provided by the patient. No language interpreter was used.  ?Back Pain ?Associated symptoms: abdominal pain   ?Abdominal Pain ?Associated symptoms: fatigue, nausea and vomiting   ? ?  ? ?Home Medications ?Prior to Admission medications   ?Medication Sig Start Date End Date Taking? Authorizing Provider  ?amLODipine (NORVASC) 10 MG tablet Take 1 tablet (10 mg total) by mouth daily. 06/21/21 08/30/21 Yes Burnadette Pop, MD  ?atorvastatin (LIPITOR) 80 MG tablet Take 1 tablet (80 mg total) by mouth daily. 06/22/21  Yes Burnadette Pop, MD  ?carvedilol (COREG) 25 MG tablet Take 1 tablet  (25 mg total) by mouth 2 (two) times daily with a meal. 06/21/21 08/30/21 Yes Adhikari, Willia Craze, MD  ?cyclobenzaprine (FLEXERIL) 5 MG tablet Take 1.5 tablets (7.5 mg total) by mouth 3 (three) times daily as needed for muscle spasms. 06/21/21  Yes Burnadette Pop, MD  ?hydrALAZINE (APRESOLINE) 100 MG tablet Take 1 tablet (100 mg total) by mouth every 8 (eight) hours. 06/21/21  Yes Burnadette Pop, MD  ?insulin glargine (LANTUS) 100 UNIT/ML Solostar Pen Inject 30 Units into the skin daily. 06/21/21 08/01/21 Yes Burnadette Pop, MD  ?omeprazole (PRILOSEC) 40 MG capsule Take 1 capsule (40 mg total) by mouth daily. 06/21/21  Yes Burnadette Pop, MD  ?ondansetron (ZOFRAN) 4 MG tablet Take 1 tablet (4 mg total) by mouth every 6 (six) hours as needed for nausea or vomiting. 08/01/21  Yes Tanda Rockers A, DO  ?oxyCODONE (ROXICODONE) 5 MG immediate release tablet Take 1 tablet (5 mg total) by mouth every 4 (four) hours as needed for up to 8 doses for severe pain. 08/01/21  Yes Tanda Rockers A, DO  ?sildenafil (VIAGRA) 100 MG tablet TAKE 0.5-1 TABLETS (50-100 MG TOTAL) BY MOUTH DAILY AS NEEDED FOR ERECTILE DYSFUNCTION. ?Patient taking differently: Take 50-100 mg by mouth daily as needed for erectile dysfunction. 01/12/21 01/12/22 Yes Georganna Skeans, MD  ?sucralfate (CARAFATE) 1 g tablet Take 1 tablet (1 g total) by mouth 4 (four) times daily -  with meals and at bedtime for 7 days. 08/01/21 08/08/21 Yes Sloan Leiter, DO  ?   ? ?Allergies    ?Tomato   ? ?Review of Systems   ?Review of Systems  ?Constitutional:  Positive for fatigue.  ?Gastrointestinal:  Positive for abdominal pain, nausea and vomiting.  ?Musculoskeletal:  Positive for back pain.  ? ?Physical Exam ?Updated Vital Signs ?BP (!) 154/73   Pulse 73   Temp 97.6 ?F (36.4 ?C) (Oral)   Resp 12   Ht  (1.727 m)   Wt 90.7 kg   SpO2 99%   BMI 30.41 kg/m?  ?Physical Exam ?Vitals and nursing note reviewed.  ?Constitutional:   ?   General: He is not in acute distress. ?   Appearance: He  is well-developed. He is not toxic-appearing or diaphoretic.  ?HENT:  ?   Head: Normocephalic and atraumatic.  ?   Right Ear: External ear normal.  ?   Left Ear: External ear normal.  ?   Mouth/Throat:  ?   Mouth: Mucous membranes are dry.  ?Eyes:  ?   General: No scleral icterus. ?Cardiovascular:  ?   Rate and Rhythm: Normal rate and regular rhythm.  ?   Pulses: Normal pulses.  ?   Heart sounds: Normal heart sounds.  ?Pulmonary:  ?   Effort: Pulmonary effort is normal. No respiratory distress.  ?   Breath sounds: Normal breath sounds.  ?Abdominal:  ?   General: Abdomen is flat.  ?   Palpations: Abdomen is soft.  ?   Tenderness: There is abdominal tenderness in the epigastric area. There is no guarding or rebound.  ?Musculoskeletal:     ?   General: Normal range of motion.  ?   Cervical back: Normal range of motion.  ?   Right lower leg: No edema.  ?   Left lower leg: No edema.  ?Skin: ?   General: Skin is warm and dry.  ?   Capillary Refill: Capillary refill takes less than 2 seconds.  ?Neurological:  ?   Mental Status: He is alert and oriented to person, place, and time.  ?   GCS: GCS eye subscore is 4. GCS verbal subscore is 5. GCS motor subscore is 6.  ?Psychiatric:     ?   Mood and Affect: Mood normal.     ?   Behavior: Behavior normal.  ? ? ?ED Results / Procedures / Treatments   ?Labs ?(all labs ordered are listed, but only abnormal results are displayed) ?Labs Reviewed  ?CBC WITH DIFFERENTIAL/PLATELET - Abnormal; Notable for the following components:  ?    Result Value  ? RBC 3.43 (*)   ? Hemoglobin 9.4 (*)   ? HCT 29.7 (*)   ? All other components within normal limits  ?URINALYSIS, ROUTINE W REFLEX MICROSCOPIC - Abnormal; Notable for the following components:  ? Color, Urine STRAW (*)   ? Glucose, UA 150 (*)   ? Hgb urine dipstick SMALL (*)   ? Protein, ur 100 (*)   ? All other components within normal limits  ?RAPID URINE DRUG SCREEN, HOSP PERFORMED - Abnormal; Notable for the following components:  ?  Opiates POSITIVE (*)   ? Tetrahydrocannabinol POSITIVE (*)   ? All other components within normal limits  ?AMMONIA - Abnormal; Notable for the following components:  ? Ammonia 51 (*)   ? All other components within normal limits  ?COMPREHENSIVE METABOLIC PANEL - Abnormal; Notable for the following components:  ? Sodium 134 (*)   ? Glucose, Bld 230 (*)   ? BUN 32 (*)   ? Creatinine, Ser 1.97 (*)   ? Calcium 8.5 (*)   ? Albumin 3.0 (*)   ? GFR, Estimated 43 (*)   ?  All other components within normal limits  ?RESP PANEL BY RT-PCR (FLU A&B, COVID) [id number]  ?ETHANOL  ?LIPASE, BLOOD  ? ? ?EKG ?EKG Interpretation ? ?Date/Time:  Wednesday August 01 2021 08:32:31 EDT ?Ventricular Rate:  74 ?PR Interval:  185 ?QRS Duration: 86 ?QT Interval:  404 ?QTC Calculation: 449 ?R Axis:   42 ?Text Interpretation: Sinus rhythm repol similar to prior 2/23 Confirmed by Tanda Rockers ([id number]) on 08/01/2021 8:39:25 AM ? ?Radiology ?CT ABDOMEN PELVIS W CONTRAST ? ?Result Date: 08/01/2021 ?CLINICAL DATA:  Acute abdominal pain, bilateral back pain. Nausea and vomiting. EXAM: CT ABDOMEN AND PELVIS WITH CONTRAST TECHNIQUE: Multidetector CT imaging of the abdomen and pelvis was performed using the standard protocol following bolus administration of intravenous contrast. RADIATION DOSE REDUCTION: This exam was performed according to the departmental dose-optimization program which includes automated exposure control, adjustment of the mA and/or kV according to patient size and/or use of iterative reconstruction technique. CONTRAST:  39mL OMNIPAQUE IOHEXOL 350 MG/ML SOLN COMPARISON:  06/18/2021. FINDINGS: Lower chest: Minimal dependent atelectasis. Heart size normal. No pericardial or pleural effusion. Distal esophagus is grossly unremarkable. Hepatobiliary: Liver and gallbladder are unremarkable. No biliary ductal dilatation. Pancreas: Negative. Spleen: Negative. Adrenals/Urinary Tract: Adrenal glands and kidneys are unremarkable. Ureters are decompressed.  Bladder appears slightly thick-walled but is under distended. Stomach/Bowel: Stomach, small bowel, appendix and colon are unremarkable. Vascular/Lymphatic: Vascular structures are unremarkable. No pathologically

## 2021-08-15 ENCOUNTER — Other Ambulatory Visit: Payer: Self-pay | Admitting: Pharmacist

## 2021-08-15 ENCOUNTER — Other Ambulatory Visit: Payer: Self-pay

## 2021-08-15 MED ORDER — BASAGLAR KWIKPEN 100 UNIT/ML ~~LOC~~ SOPN
30.0000 [IU] | PEN_INJECTOR | Freq: Every day | SUBCUTANEOUS | 0 refills | Status: DC
Start: 2021-08-15 — End: 2021-09-17
  Filled 2021-08-15: qty 9, 30d supply, fill #0

## 2021-08-15 MED ORDER — TRUEPLUS 5-BEVEL PEN NEEDLES 32G X 4 MM MISC
0 refills | Status: DC
  Filled 2021-08-15: qty 100, 100d supply, fill #0

## 2021-09-11 ENCOUNTER — Encounter (HOSPITAL_COMMUNITY): Payer: Self-pay

## 2021-09-11 ENCOUNTER — Other Ambulatory Visit: Payer: Self-pay

## 2021-09-11 ENCOUNTER — Inpatient Hospital Stay (HOSPITAL_COMMUNITY)
Admission: EM | Admit: 2021-09-11 | Discharge: 2021-09-13 | DRG: 440 | Disposition: A | Discharge status: Home / Self Care | Payer: Self-pay | Attending: Internal Medicine | Admitting: Internal Medicine

## 2021-09-11 DIAGNOSIS — Z79899 Other long term (current) drug therapy: Secondary | ICD-10-CM

## 2021-09-11 DIAGNOSIS — Z794 Long term (current) use of insulin: Secondary | ICD-10-CM

## 2021-09-11 DIAGNOSIS — K861 Other chronic pancreatitis: Secondary | ICD-10-CM | POA: Diagnosis present

## 2021-09-11 DIAGNOSIS — G8929 Other chronic pain: Principal | ICD-10-CM

## 2021-09-11 DIAGNOSIS — I10 Essential (primary) hypertension: Secondary | ICD-10-CM | POA: Diagnosis present

## 2021-09-11 DIAGNOSIS — Z87891 Personal history of nicotine dependence: Secondary | ICD-10-CM

## 2021-09-11 DIAGNOSIS — Z8249 Family history of ischemic heart disease and other diseases of the circulatory system: Secondary | ICD-10-CM

## 2021-09-11 DIAGNOSIS — I160 Hypertensive urgency: Secondary | ICD-10-CM | POA: Diagnosis present

## 2021-09-11 DIAGNOSIS — Z833 Family history of diabetes mellitus: Secondary | ICD-10-CM

## 2021-09-11 DIAGNOSIS — R112 Nausea with vomiting, unspecified: Secondary | ICD-10-CM | POA: Diagnosis present

## 2021-09-11 DIAGNOSIS — E119 Type 2 diabetes mellitus without complications: Secondary | ICD-10-CM

## 2021-09-11 DIAGNOSIS — E1169 Type 2 diabetes mellitus with other specified complication: Secondary | ICD-10-CM | POA: Diagnosis present

## 2021-09-11 DIAGNOSIS — E785 Hyperlipidemia, unspecified: Secondary | ICD-10-CM | POA: Diagnosis present

## 2021-09-11 DIAGNOSIS — E1165 Type 2 diabetes mellitus with hyperglycemia: Secondary | ICD-10-CM | POA: Diagnosis present

## 2021-09-11 DIAGNOSIS — R109 Unspecified abdominal pain: Secondary | ICD-10-CM

## 2021-09-11 DIAGNOSIS — K859 Acute pancreatitis without necrosis or infection, unspecified: Principal | ICD-10-CM | POA: Diagnosis present

## 2021-09-11 LAB — RAPID URINE DRUG SCREEN, HOSP PERFORMED
Amphetamines: NOT DETECTED
Barbiturates: NOT DETECTED
Benzodiazepines: NOT DETECTED
Cocaine: NOT DETECTED
Opiates: NOT DETECTED
Tetrahydrocannabinol: POSITIVE — AB

## 2021-09-11 LAB — URINALYSIS, ROUTINE W REFLEX MICROSCOPIC
Bilirubin Urine: NEGATIVE
Glucose, UA: NEGATIVE mg/dL
Ketones, ur: NEGATIVE mg/dL
Leukocytes,Ua: NEGATIVE
Nitrite: NEGATIVE
Protein, ur: >=300 mg/dL — AB
Specific Gravity, Urine: 1.013 (ref 1.005–1.030)
pH: 6 (ref 5.0–8.0)

## 2021-09-11 LAB — COMPREHENSIVE METABOLIC PANEL
ALT: 21 U/L (ref 0–44)
AST: 22 U/L (ref 15–41)
Albumin: 3.7 g/dL (ref 3.5–5.0)
Alkaline Phosphatase: 113 U/L (ref 38–126)
Anion gap: 5 (ref 5–15)
BUN: 18 mg/dL (ref 6–20)
CO2: 25 mmol/L (ref 22–32)
Calcium: 8.9 mg/dL (ref 8.9–10.3)
Chloride: 107 mmol/L (ref 98–111)
Creatinine, Ser: 1.29 mg/dL — ABNORMAL HIGH (ref 0.61–1.24)
GFR, Estimated: >60 mL/min (ref >60)
Glucose, Bld: 92 mg/dL (ref 70–99)
Potassium: 4.1 mmol/L (ref 3.5–5.1)
Sodium: 137 mmol/L (ref 135–145)
Total Bilirubin: 0.8 mg/dL (ref 0.3–1.2)
Total Protein: 8.1 g/dL (ref 6.5–8.1)

## 2021-09-11 LAB — CBC
HCT: 34.9 % — ABNORMAL LOW (ref 39.0–52.0)
Hemoglobin: 11.4 g/dL — ABNORMAL LOW (ref 13.0–17.0)
MCH: 27.6 pg (ref 26.0–34.0)
MCHC: 32.7 g/dL (ref 30.0–36.0)
MCV: 84.5 fL (ref 80.0–100.0)
Platelets: 325 10*3/uL (ref 150–400)
RBC: 4.13 MIL/uL — ABNORMAL LOW (ref 4.22–5.81)
RDW: 13 % (ref 11.5–15.5)
WBC: 6.8 10*3/uL (ref 4.0–10.5)
nRBC: 0 % (ref 0.0–0.2)

## 2021-09-11 LAB — LIPASE, BLOOD: Lipase: 59 U/L — ABNORMAL HIGH (ref 11–51)

## 2021-09-11 MED ORDER — LABETALOL HCL 5 MG/ML IV SOLN: 10.0000 mg | INTRAVENOUS | Status: DC | PRN

## 2021-09-11 MED ORDER — LABETALOL HCL 5 MG/ML IV SOLN
10.0000 mg | Freq: Once | INTRAVENOUS | Status: AC
  Administered 2021-09-11: 10 mg via INTRAVENOUS
  Filled 2021-09-11: qty 4

## 2021-09-11 MED ORDER — ONDANSETRON HCL 4 MG PO TABS: 4.0000 mg | ORAL_TABLET | Freq: Four times a day (QID) | ORAL | Status: DC | PRN

## 2021-09-11 MED ORDER — ACETAMINOPHEN 650 MG RE SUPP: 650.0000 mg | Freq: Four times a day (QID) | RECTAL | Status: DC | PRN

## 2021-09-11 MED ORDER — ATORVASTATIN CALCIUM 40 MG PO TABS
80.0000 mg | ORAL_TABLET | Freq: Every day | ORAL | Status: DC
  Administered 2021-09-12 – 2021-09-13 (×2): 80 mg via ORAL
  Filled 2021-09-11 (×2): qty 2

## 2021-09-11 MED ORDER — DROPERIDOL 2.5 MG/ML IJ SOLN
2.5000 mg | Freq: Once | INTRAMUSCULAR | Status: AC
  Administered 2021-09-11: 2.5 mg via INTRAVENOUS
  Filled 2021-09-11: qty 2

## 2021-09-11 MED ORDER — CARVEDILOL 12.5 MG PO TABS
25.0000 mg | ORAL_TABLET | Freq: Two times a day (BID) | ORAL | Status: DC
  Administered 2021-09-12 – 2021-09-13 (×3): 25 mg via ORAL
  Filled 2021-09-11 (×3): qty 2

## 2021-09-11 MED ORDER — ENOXAPARIN SODIUM 40 MG/0.4ML IJ SOSY
40.0000 mg | PREFILLED_SYRINGE | INTRAMUSCULAR | Status: DC
  Administered 2021-09-11 – 2021-09-12 (×2): 40 mg via SUBCUTANEOUS
  Filled 2021-09-11 (×2): qty 0.4

## 2021-09-11 MED ORDER — ONDANSETRON 4 MG PO TBDP
4.0000 mg | ORAL_TABLET | Freq: Three times a day (TID) | ORAL | 0 refills | Status: DC | PRN
  Filled 2021-09-11: qty 10, 4d supply, fill #0

## 2021-09-11 MED ORDER — HYDRALAZINE HCL 20 MG/ML IJ SOLN
10.0000 mg | Freq: Once | INTRAMUSCULAR | Status: AC
  Administered 2021-09-11: 10 mg via INTRAVENOUS
  Filled 2021-09-11: qty 1

## 2021-09-11 MED ORDER — ONDANSETRON 4 MG PO TBDP
4.0000 mg | ORAL_TABLET | Freq: Once | ORAL | Status: AC | PRN
Start: 2021-09-11 — End: 2021-09-11
  Administered 2021-09-11: 4 mg via ORAL
  Filled 2021-09-11: qty 1

## 2021-09-11 MED ORDER — CLONIDINE HCL 0.1 MG PO TABS
0.2000 mg | ORAL_TABLET | Freq: Once | ORAL | Status: AC
  Administered 2021-09-11: 0.2 mg via ORAL
  Filled 2021-09-11: qty 2

## 2021-09-11 MED ORDER — AMLODIPINE BESYLATE 5 MG PO TABS
10.0000 mg | ORAL_TABLET | Freq: Every day | ORAL | Status: DC
  Administered 2021-09-12 – 2021-09-13 (×2): 10 mg via ORAL
  Filled 2021-09-11 (×2): qty 2

## 2021-09-11 MED ORDER — ONDANSETRON HCL 4 MG/2ML IJ SOLN: 4.0000 mg | Freq: Four times a day (QID) | INTRAMUSCULAR | Status: DC | PRN

## 2021-09-11 MED ORDER — HYDROCORTISONE 1 % EX CREA: TOPICAL_CREAM | Freq: Two times a day (BID) | CUTANEOUS | Status: DC | PRN

## 2021-09-11 MED ORDER — HYDRALAZINE HCL 25 MG PO TABS
100.0000 mg | ORAL_TABLET | Freq: Three times a day (TID) | ORAL | Status: DC
  Administered 2021-09-12 – 2021-09-13 (×5): 100 mg via ORAL
  Filled 2021-09-11 (×5): qty 4

## 2021-09-11 MED ORDER — ACETAMINOPHEN 325 MG PO TABS: 650.0000 mg | ORAL_TABLET | Freq: Four times a day (QID) | ORAL | Status: DC | PRN

## 2021-09-11 MED ORDER — LORAZEPAM 2 MG/ML IJ SOLN
0.5000 mg | Freq: Once | INTRAMUSCULAR | Status: AC
  Administered 2021-09-11: 0.5 mg via INTRAVENOUS
  Filled 2021-09-11: qty 1

## 2021-09-11 MED ORDER — HYDRALAZINE HCL 25 MG PO TABS
100.0000 mg | ORAL_TABLET | Freq: Once | ORAL | Status: AC
  Administered 2021-09-11: 100 mg via ORAL
  Filled 2021-09-11: qty 4

## 2021-09-11 MED ORDER — OXYCODONE HCL 5 MG PO TABS
5.0000 mg | ORAL_TABLET | Freq: Four times a day (QID) | ORAL | 0 refills | Status: DC | PRN
  Filled 2021-09-11: qty 6, 2d supply, fill #0

## 2021-09-11 MED ORDER — HYDROCODONE-ACETAMINOPHEN 5-325 MG PO TABS
1.0000 | ORAL_TABLET | Freq: Once | ORAL | Status: DC
  Filled 2021-09-11: qty 1

## 2021-09-11 MED ORDER — AMLODIPINE BESYLATE 5 MG PO TABS
10.0000 mg | ORAL_TABLET | Freq: Once | ORAL | Status: AC
  Administered 2021-09-11: 10 mg via ORAL
  Filled 2021-09-11: qty 2

## 2021-09-11 MED ORDER — SENNOSIDES-DOCUSATE SODIUM 8.6-50 MG PO TABS: 1.0000 | ORAL_TABLET | Freq: Every evening | ORAL | Status: DC | PRN

## 2021-09-11 MED ORDER — HYDROMORPHONE HCL 1 MG/ML IJ SOLN
0.5000 mg | Freq: Once | INTRAMUSCULAR | Status: AC
  Administered 2021-09-11: 0.5 mg via INTRAVENOUS
  Filled 2021-09-11: qty 1

## 2021-09-11 MED ORDER — CARVEDILOL 12.5 MG PO TABS
25.0000 mg | ORAL_TABLET | Freq: Once | ORAL | Status: AC
Start: 2021-09-11 — End: 2021-09-11
  Administered 2021-09-11: 25 mg via ORAL
  Filled 2021-09-11: qty 2

## 2021-09-11 MED ORDER — INSULIN ASPART 100 UNIT/ML IJ SOLN
0.0000 [IU] | Freq: Every day | INTRAMUSCULAR | Status: DC
  Filled 2021-09-11: qty 0.05

## 2021-09-11 MED ORDER — SODIUM CHLORIDE 0.9% FLUSH
3.0000 mL | Freq: Two times a day (BID) | INTRAVENOUS | Status: DC
  Administered 2021-09-11 – 2021-09-13 (×3): 3 mL via INTRAVENOUS

## 2021-09-11 MED ORDER — SODIUM CHLORIDE 0.9 % IV BOLUS
1000.0000 mL | Freq: Once | INTRAVENOUS | Status: AC
Start: 2021-09-11 — End: 2021-09-11
  Administered 2021-09-11: 1000 mL via INTRAVENOUS

## 2021-09-11 MED ORDER — ONDANSETRON HCL 4 MG/2ML IJ SOLN
4.0000 mg | Freq: Once | INTRAMUSCULAR | Status: AC
  Administered 2021-09-11: 4 mg via INTRAVENOUS
  Filled 2021-09-11: qty 2

## 2021-09-11 MED ORDER — INSULIN ASPART 100 UNIT/ML IJ SOLN
0.0000 [IU] | Freq: Three times a day (TID) | INTRAMUSCULAR | Status: DC
  Administered 2021-09-12 (×2): 2 [IU] via SUBCUTANEOUS
  Administered 2021-09-12: 5 [IU] via SUBCUTANEOUS
  Administered 2021-09-13: 1 [IU] via SUBCUTANEOUS
  Filled 2021-09-11: qty 0.09

## 2021-09-11 NOTE — ED Provider Notes (Signed)
?[facility]-EMERGENCY DEPT ?Provider Note ? ? ?CSN: [id number] ?Arrival date & time: 09/11/21  0831 ? ?  ? ?History ? ?Chief Complaint  ?Patient presents with  ? Abdominal Pain  ? Emesis  ? ? ?Randy R Tommi Emerydside Jr. is a 41 y.o. male. ? ?Patient with history of pancreatitis, frequent ED visits for abdominal pain, hypertension --presents to the emergency department today for vomiting and abdominal pain that began this morning.  Pain radiates to the back.  Randy [name] states that yesterday Randy [name] was doing okay.  Today Randy [name] developed pain in the upper abdomen and an episode of vomiting.  No fevers.  Pain is very similar to previous episodes.  No known triggers.  Also with admissions for hypertensive urgency.  On last admission, treated with IV labetalol and hydralazine.  Randy [name] denies drugs or alcohol.  Randy [name] denies heavy NSAID usage.  Randy [name] has had 6 CT scans of the abdomen and pelvis over the past 1 year.  Sometimes this will show signs of mild pancreatitis, other times they are typically normal.  ? ? ?  ? ?Home Medications ?Prior to Admission medications   ?Medication Sig Start Date End Date Taking? Authorizing Provider  ?amLODipine (NORVASC) 10 MG tablet Take 1 tablet (10 mg total) by mouth daily. 06/21/21 08/31/21  Burnadette PopAdhikari, Amrit, MD  ?atorvastatin (LIPITOR) 80 MG tablet Take 1 tablet (80 mg total) by mouth daily. 06/22/21   Burnadette PopAdhikari, Amrit, MD  ?carvedilol (COREG) 25 MG tablet Take 1 tablet (25 mg total) by mouth 2 (two) times daily with a meal. 06/21/21 08/31/21  Burnadette PopAdhikari, Amrit, MD  ?cyclobenzaprine (FLEXERIL) 5 MG tablet Take 1.5 tablets (7.5 mg total) by mouth 3 (three) times daily as needed for muscle spasms. 06/21/21   Burnadette PopAdhikari, Amrit, MD  ?hydrALAZINE (APRESOLINE) 100 MG tablet Take 1 tablet (100 mg total) by mouth every 8 (eight) hours. 06/21/21   Burnadette PopAdhikari, Amrit, MD  ?Insulin Glargine (BASAGLAR KWIKPEN) 100 UNIT/ML Inject 30 Units into the skin daily. PLEASE MAKE APPOINTMENT 08/15/21   Hoy RegisterNewlin, Enobong, MD  ?Insulin Pen  Needle (TRUEPLUS 5-BEVEL PEN NEEDLES) 32G X 4 MM MISC Use to inject basaglar once daily. 08/15/21   Hoy RegisterNewlin, Enobong, MD  ?omeprazole (PRILOSEC) 40 MG capsule Take 1 capsule (40 mg total) by mouth daily. 06/21/21   Burnadette PopAdhikari, Amrit, MD  ?ondansetron (ZOFRAN) 4 MG tablet Take 1 tablet (4 mg total) by mouth every 6 (six) hours as needed for nausea or vomiting. 08/01/21   Tanda RockersGray, Samuel A, DO  ?oxyCODONE (ROXICODONE) 5 MG immediate release tablet Take 1 tablet (5 mg total) by mouth every 4 (four) hours as needed for up to 8 doses for severe pain. 08/01/21   Sloan LeiterGray, Samuel A, DO  ?sildenafil (VIAGRA) 100 MG tablet TAKE 0.5-1 TABLETS (50-100 MG TOTAL) BY MOUTH DAILY AS NEEDED FOR ERECTILE DYSFUNCTION. ?Patient taking differently: Take 50-100 mg by mouth daily as needed for erectile dysfunction. 01/12/21 01/12/22  Georganna SkeansWilson, Amelia, MD  ?sucralfate (CARAFATE) 1 g tablet Take 1 tablet (1 g total) by mouth 4 (four) times daily -  with meals and at bedtime for 7 days. 08/01/21 08/08/21  Sloan LeiterGray, Samuel A, DO  ?   ? ?Allergies    ?Tomato   ? ?Review of Systems   ?Review of Systems ? ?Physical Exam ?Updated Vital Signs ?BP (!) 207/109 (BP Location: Right Arm)   Pulse 70   Temp 98.2 ?F (36.8 ?C) (Oral)   Resp 16   Ht 5\' 8"  (1.727 m)   Wt  89.8 kg   SpO2 100%   BMI 30.11 kg/m?  ? ?Physical Exam ?Vitals and nursing note reviewed.  ?Constitutional:   ?   General: Randy [name] is not in acute distress. ?   Appearance: Randy [name] is well-developed.  ?HENT:  ?   Head: Normocephalic and atraumatic.  ?Eyes:  ?   General:     ?   Right eye: No discharge.     ?   Left eye: No discharge.  ?   Conjunctiva/sclera: Conjunctivae normal.  ?Cardiovascular:  ?   Rate and Rhythm: Normal rate and regular rhythm.  ?   Heart sounds: Normal heart sounds.  ?Pulmonary:  ?   Effort: Pulmonary effort is normal.  ?   Breath sounds: Normal breath sounds.  ?Abdominal:  ?   Palpations: Abdomen is soft.  ?   Tenderness: There is abdominal tenderness in the epigastric area and left upper  quadrant. There is no guarding or rebound.  ?Musculoskeletal:  ?   Cervical back: Normal range of motion and neck supple.  ?Skin: ?   General: Skin is warm and dry.  ?Neurological:  ?   Mental Status: Randy [name] is alert.  ? ? ?ED Results / Procedures / Treatments   ?Labs ?(all labs ordered are listed, but only abnormal results are displayed) ?Labs Reviewed  ?LIPASE, BLOOD - Abnormal; Notable for the following components:  ?    Result Value  ? Lipase 59 (*)   ? All other components within normal limits  ?COMPREHENSIVE METABOLIC PANEL - Abnormal; Notable for the following components:  ? Creatinine, Ser 1.29 (*)   ? All other components within normal limits  ?CBC - Abnormal; Notable for the following components:  ? RBC 4.13 (*)   ? Hemoglobin 11.4 (*)   ? HCT 34.9 (*)   ? All other components within normal limits  ?URINALYSIS, ROUTINE W REFLEX MICROSCOPIC - Abnormal; Notable for the following components:  ? Color, Urine STRAW (*)   ? Hgb urine dipstick SMALL (*)   ? Protein, ur >=300 (*)   ? Bacteria, UA RARE (*)   ? All other components within normal limits  ? ? ?EKG ?None ? ?Radiology ?No results found. ? ?Procedures ?Procedures  ? ? ?Medications Ordered in ED ?Medications  ?ondansetron (ZOFRAN-ODT) disintegrating tablet 4 mg (4 mg Oral Given 09/11/21 0925)  ?HYDROmorphone (DILAUDID) injection 0.5 mg (0.5 mg Intravenous Given 09/11/21 1424)  ?ondansetron (ZOFRAN) injection 4 mg (4 mg Intravenous Given 09/11/21 1421)  ?sodium chloride 0.9 % bolus 1,000 mL (1,000 mLs Intravenous New Bag/Given 09/11/21 1425)  ?hydrALAZINE (APRESOLINE) injection 10 mg (10 mg Intravenous Given 09/11/21 1422)  ?labetalol (NORMODYNE) injection 10 mg (10 mg Intravenous Given 09/11/21 1523)  ? ? ?ED Course/ Medical Decision Making/ A&P ?  ? ?Patient seen and examined. History obtained directly from patient.  Also reviewed previous admission notes and ED visits.  Work-up including labs, imaging, EKG ordered in triage, if performed, were reviewed.   ? ?Labs/EKG:  Independently reviewed and interpreted.  This included: CBC with normal white blood cell count, mild anemia at 11.4; CMP with mildly elevated creatinine at 1.29 otherwise unremarkable with normal liver function testing; lipase minimally elevated at 59; UA with proteinuria and some red blood cells.  ? ?Imaging: Considered CT imaging.  Patient's current symptoms seem to be in line with several presentations over the past year.  Labs are reassuring and lipase is minimally elevated.  Will attempt to treat symptoms, including blood pressure, and reassess  need for imaging. ? ?Medications/Fluids: Ordered: IV Dilaudid, IV Zofran, fluid bolus, IV hydralazine.  Patient has been prescribed oral hydralazine. ? ?Most recent vital signs reviewed and are as follows: ?BP (!) 207/109 (BP Location: Right Arm)   Pulse 70   Temp 98.2 ?F (36.8 ?C) (Oral)   Resp 16   Ht 5\' 8"  (1.727 m)   Wt 89.8 kg   SpO2 100%   BMI 30.11 kg/m?  ? ?Initial impression: Acute on chronic abdominal pain, vomiting ? ?2:36 PM Reassessment performed. Patient appears sleepy, states pain medicine is helping somewhat.  ? ?BP 190's/90's after dose of hydralazine. HR 88.  ? ?Plan: IV labetalol 10mg .  ? ?3:48 PM Reassessment performed. Patient appears stable.  Trending towards improvement.  Blood pressures now 160s.  ? ?Most current vital signs reviewed and are as follows: ?BP (!) 174/96   Pulse 90   Temp 98.1 ?F (36.7 ?C)   Resp 17   Ht 5\' 8"  (1.727 m)   Wt 89.8 kg   SpO2 98%   BMI 30.11 kg/m?  ? ?Plan: PO challenge, reassessment. ? ?If pain improving, patient can be discharged to home with pain medication and nausea medication, with continued hydration, PCP follow-up.  If blood pressure uncontrolled or vomiting, may need further treatment. ? ? ?                        ?Medical Decision Making ?Amount and/or Complexity of Data Reviewed ?Labs: ordered. ? ?Risk ?Prescription drug management. ? ? ?For this patient's complaint of abdominal pain, the  following conditions were considered on the differential diagnosis: gastritis/PUD, enteritis/duodenitis, appendicitis, cholelithiasis/cholecystitis, cholangitis, pancreatitis, ruptured viscus, colitis, diverticulitis, proctitis, cystitis,

## 2021-09-11 NOTE — Discharge Instructions (Signed)
Please read and follow all provided instructions. ? ?Your diagnoses today include:  ?1. Chronic abdominal pain   ?2. Hypertension, unspecified type   ? ? ?Tests performed today include: ?Blood cell counts and platelets ?Kidney and liver function tests ?Pancreas function test (called lipase) ?Urine test to look for infection: no infection, shows protein, should be followed up by your doctor ?Vital signs. See below for your results today.  ? ?Medications prescribed:  ?Oxycodone - narcotic pain medication ? ?DO NOT drive or perform any activities that require you to be awake and alert because this medicine can make you drowsy.  ? ?Zofran (ondansetron) - for nausea and vomiting ? ?Take any prescribed medications only as directed. ? ?Home care instructions:  ?Follow any educational materials contained in this packet. ? ?Follow-up instructions: ?Please follow-up with your primary care provider in the next 2 days for further evaluation of your symptoms.   ? ?Return instructions:  ?SEEK IMMEDIATE MEDICAL ATTENTION IF: ?The pain does not go away or becomes severe  ?A temperature above 101F develops  ?Repeated vomiting occurs (multiple episodes)  ?The pain becomes localized to portions of the abdomen. The right side could possibly be appendicitis. In an adult, the left lower portion of the abdomen could be colitis or diverticulitis.  ?Blood is being passed in stools or vomit (bright red or black tarry stools)  ?You develop chest pain, difficulty breathing, dizziness or fainting, or become confused, poorly responsive, or inconsolable (young children) ?If you have any other emergent concerns regarding your health ? ?Additional Information: ?Abdominal (belly) pain can be caused by many things. Your caregiver performed an examination and possibly ordered blood/urine tests and imaging (CT scan, x-rays, ultrasound). Many cases can be observed and treated at home after initial evaluation in the emergency department. Even though you  are being discharged home, abdominal pain can be unpredictable. Therefore, you need a repeated exam if your pain does not resolve, returns, or worsens. Most patients with abdominal pain don't have to be admitted to the hospital or have surgery, but serious problems like appendicitis and gallbladder attacks can start out as nonspecific pain. Many abdominal conditions cannot be diagnosed in one visit, so follow-up evaluations are very important. ? ?Your vital signs today were: ?BP (!) 174/96   Pulse 90   Temp 98.1 ?F (36.7 ?C)   Resp 17   Ht 5\' 8"  (1.727 m)   Wt 89.8 kg   SpO2 98%   BMI 30.11 kg/m?  ?If your blood pressure (bp) was elevated above 135/85 this visit, please have this repeated by your doctor within one month. ?-------------- ? ?

## 2021-09-11 NOTE — Assessment & Plan Note (Addendum)
Received sliding scale insulin at home.  Resume insulin regimen from home on discharge. ?

## 2021-09-11 NOTE — Assessment & Plan Note (Addendum)
Resume statins 

## 2021-09-11 NOTE — Hospital Course (Addendum)
Randy [name]. is a 41 y.o. male with past medical history of diabetes mellitus type 2, hypertension, hyperlipidemia, history of recurrent pancreatitis and marijuana usage presented to hospital with left lower quadrant abdominal pain with nausea and vomiting.  In the ED, patient was somnolent due to sedatives.  Blood pressure was elevated.  He was afebrile.  Labs showed creatinine of 1.2.  LFTs were within normal limits.  Lipase of 59.  UA was negative for UTI.  Patient was given supportive care including IV fluids, antihypertensives and was admitted to hospital for abdominal pain likely secondary to pancreatitis and hypertensive urgency. ?

## 2021-09-11 NOTE — ED Provider Triage Note (Signed)
Emergency Medicine Provider Triage Evaluation Note ? ?Randy [name]. , a 41 y.o. male  was evaluated in triage.  Pt complains of 41 year old male presents emergency department with less than 1 day history of abdominal pain, nausea, vomiting.  He states that he recently came back from a trip from [place] yesterday.  When he woke up this morning he began to have abdominal pain.  Pain described as sharp in nature and radiating to back.  It has been constant since symptom onset. He has had three episodes of vomiting this morning.  He denies recent alcohol use.  States that his last drink was 15 years ago.  He does state that he has been consuming more marijuana recently. Denies fever, chills, night sweats, chest pain, shortness of breath, urinary symptoms/change in bowel habits, hematemesis, hematochezia. ?Past medical history significant for hypertension, hyperlipidemia, pancreatitis, diabetes 2, marijuana use/abuse. ? ?Review of Systems  ?Positive: Abdominal pain and nausea ?Negative: fever, chills, night sweats, chest pain, shortness of breath, urinary symptoms/change in bowel habits, hematemesis, hematochezia. ? ?Physical Exam  ?BP (!) 207/109 (BP Location: Right Arm)   Pulse 70   Temp 98.2 ?F (36.8 ?C) (Oral)   Resp 16   Ht 5\' 8"  (1.727 m)   Wt 89.8 kg   SpO2 100%   BMI 30.11 kg/m?  ?Gen:   Awake, no distress   ?Resp:  Normal effort  ?MSK:   Moves extremities without difficulty  ?Other:  Epigatric TTP on exam ? ?Medical Decision Making  ?Medically screening exam initiated at 12:50 PM.  Appropriate orders placed.  Randy R . was informed that the remainder of the evaluation will be completed by another provider, this initial triage assessment does not replace that evaluation, and the importance of remaining in the ED until their evaluation is complete. ? ? ?  ?[employer], Peter Garter ?09/11/21 1251 ? ?

## 2021-09-11 NOTE — Assessment & Plan Note (Addendum)
Continue home regimen of amlodipine Coreg and hydralazine. Refills have been given.  Emphasized compliance to medications. ?

## 2021-09-11 NOTE — ED Triage Notes (Addendum)
Per EMs- Patient reports that he has LUQ abdominal pain that he rates 10/10. Patient reports a history of pancreatitis. ? ?Patient is legally blind. ? ?Patient added that he has nasal congestion since waking this AM. ?

## 2021-09-11 NOTE — ED Notes (Signed)
M.D aware of patient blood pressure. 

## 2021-09-11 NOTE — ED Notes (Signed)
Pt given ginger ale and water 

## 2021-09-11 NOTE — ED Provider Notes (Signed)
Here for abd pain LUQ. ?Sams as always. ? 6ct this past year. ?No bp meds today. ? ?Plan time- steady bp and not vomiting. ? ?Per previoius PA Hpi ?"Patient with history of pancreatitis, frequent ED visits for abdominal pain, hypertension --presents to the emergency department today for vomiting and abdominal pain that began this morning.  Pain radiates to the back.  He states that yesterday he was doing okay.  Today he developed pain in the upper abdomen and an episode of vomiting.  No fevers.  Pain is very similar to previous episodes.  No known triggers.  Also with admissions for hypertensive urgency.  On last admission, treated with IV labetalol and hydralazine.  He denies drugs or alcohol.  He denies heavy NSAID usage.  He has had 6 CT scans of the abdomen and pelvis over the past 1 year.  Sometimes this will show signs of mild pancreatitis, other times they are typically normal. " ? ?9:13 PM ?Patient not actively vomiting. Pain improved. Persistent hypertension desoint IV and PO blood pressure meds., pain meds, and antiemetics. ? ?Vitals:  ? 09/11/21 2030 09/11/21 2100 09/11/21 2111 09/11/21 2112  ?BP: (!) 183/101 (!) 222/110 (!) 220/110 (!) 220/108  ?Pulse: 94  (!) 107   ?Resp: 18  18   ?Temp:      ?TempSrc:      ?SpO2: 98%  97%   ?Weight:      ?Height:      ? ? ?Medications  ?ondansetron (ZOFRAN-ODT) disintegrating tablet 4 mg (4 mg Oral Given 09/11/21 0925)  ?HYDROmorphone (DILAUDID) injection 0.5 mg (0.5 mg Intravenous Given 09/11/21 1424)  ?ondansetron (ZOFRAN) injection 4 mg (4 mg Intravenous Given 09/11/21 1421)  ?sodium chloride 0.9 % bolus 1,000 mL (0 mLs Intravenous Stopped 09/11/21 2029)  ?hydrALAZINE (APRESOLINE) injection 10 mg (10 mg Intravenous Given 09/11/21 1422)  ?labetalol (NORMODYNE) injection 10 mg (10 mg Intravenous Given 09/11/21 1523)  ?amLODipine (NORVASC) tablet 10 mg (10 mg Oral Given 09/11/21 1736)  ?carvedilol (COREG) tablet 25 mg (25 mg Oral Given 09/11/21 1736)  ?hydrALAZINE (APRESOLINE) tablet  100 mg (100 mg Oral Given 09/11/21 1736)  ?droperidol (INAPSINE) 2.5 MG/ML injection 2.5 mg (2.5 mg Intravenous Given 09/11/21 2011)  ?LORazepam (ATIVAN) injection 0.5 mg (0.5 mg Intravenous Given 09/11/21 2012)  ? ?41 year old male here with vomiting.  His abdominal pain and vomiting have significantly improved with treatment.  Despite multiple medications to treat the patient's blood pressure he remains extraordinarily hypertensive with a blood pressure of 220/108.  Patient will need admission for hypertensive urgency.  Case discussed with Dr. Allena Katz who will admit the patient.  Do not feel that the patient needs repeat imaging given his history of chronic abdominal pain. ? ?.Critical Care ?Performed by: Arthor Captain, PA-C ?Authorized by: Arthor Captain, PA-C  ? ?Critical care provider statement:  ?  Critical care time (minutes):  60 ?  Critical care time was exclusive of:  Separately billable procedures and treating other patients ?  Critical care was necessary to treat or prevent imminent or life-threatening deterioration of the following conditions: hypertensive emergency. ?  Critical care was time spent personally by me on the following activities:  Development of treatment plan with patient or surrogate, discussions with consultants, evaluation of patient's response to treatment, examination of patient, ordering and review of laboratory studies, ordering and review of radiographic studies, ordering and performing treatments and interventions, pulse oximetry, re-evaluation of patient's condition and review of old charts ? ?  ?Arthor Captain, PA-C ?  09/11/21 2243 ? ?  ?Terald Sleeper, MD ?09/12/21 0000 ? ?

## 2021-09-11 NOTE — ED Notes (Signed)
Changed BP cuff to larger to see if BP reading would be different - same range with both size adult cuffs along with in both arm - ED MD aware  ?

## 2021-09-11 NOTE — H&P (Signed)
?History and Physical  ? ? ?Randy [name]. IRW:[id number] DOB: 07-Jun-1980 DOA: 09/11/2021 ? ?PCP: Rema Fendt, NP  ?Patient coming from: Home ? ?I have personally briefly reviewed patient's old medical records in [facility] ? ?Chief Complaint: LUQ abdominal pain, nausea, vomiting ? ?HPI: ?Randy [name]. is a 41 y.o. male with medical history significant for insulin-dependent T2DM, HTN, HLD, recurrent pancreatitis, marijuana use who presented to the ED for evaluation of left upper quadrant pain, nausea, vomiting. ? ?Patient's somnolent after receiving several sedating medications while in the ED but will intermittently answer some questions.  He reported new left upper quadrant abdominal pain associated with nausea and vomiting beginning today (5/2).  This felt similar to his prior episodes of abdominal pain.  He states that he has been taking his antihypertensives prior to today.  No chest pain, dyspnea, fevers, lower extremity swelling. ? ?ED Course  Labs/Imaging on admission: I have personally reviewed following labs and imaging studies. ? ?Initial vitals showed BP 198/104, pulse 81, RR 18, temp 98.3 ?F, SPO2 100% on room air. ? ?Labs show sodium 137, potassium 4.1, bicarb 25, BUN 18, creatinine 1.29, serum glucose 92, LFTs within normal limits, lipase 59, WBC 6.8, hemoglobin 11.4, platelets 325,000.  Urinalysis negative for UTI. ? ?Patient was given 1 L normal saline, IV Zofran, IV Ativan 0.5 mg, IV droperidol 2.5 mg with improvement in nausea and vomiting.  He received multiple antihypertensives including home oral amlodipine 10 mg, Coreg 25 mg, hydralazine 100 mg as well as IV hydralazine 10 mg, IV labetalol 10 mg with persistent hypertensive urgency.  The hospitalist service was consulted to admit for further evaluation and management. ? ?Review of Systems: All systems reviewed and are negative except as documented in history of present illness above. ? ? ?Past Medical History:  ?Diagnosis  Date  ? Essential hypertension 05/31/2019  ? HLD (hyperlipidemia)   ? Pancreatitis   ? Type II diabetes mellitus (HCC)   ? ? ?Past Surgical History:  ?Procedure Laterality Date  ? NO PAST SURGERIES    ? ? ?Social History: ? reports that he quit smoking about 9 years ago. His smoking use included cigarettes. He has a 5.61 pack-year smoking history. He has never used smokeless tobacco. He reports current drug use. Drug: Marijuana. He reports that he does not drink alcohol. ? ?Allergies  ?Allergen Reactions  ? Tomato Other (See Comments)  ?  unknown  ? ? ?Family History  ?Problem Relation Age of Onset  ? Hypertension Mother   ? Diabetes Father   ? Hypertension Father   ? Diabetes Brother   ? Diabetes Maternal Grandmother   ? Colon cancer Neg Hx   ? Esophageal cancer Neg Hx   ? Rectal cancer Neg Hx   ? Stomach cancer Neg Hx   ? ? ? ?Prior to Admission medications   ?Medication Sig Start Date End Date Taking? Authorizing Provider  ?ondansetron (ZOFRAN-ODT) 4 MG disintegrating tablet Take 1 tablet (4 mg total) by mouth every 8 (eight) hours as needed for nausea or vomiting. 09/11/21  Yes Renne Crigler, PA-C  ?oxyCODONE (OXY IR/ROXICODONE) 5 MG immediate release tablet Take 1 tablet (5 mg total) by mouth every 6 (six) hours as needed for severe pain. 09/11/21  Yes Renne Crigler, PA-C  ?amLODipine (NORVASC) 10 MG tablet Take 1 tablet (10 mg total) by mouth daily. 06/21/21 08/31/21  Burnadette Pop, MD  ?atorvastatin (LIPITOR) 80 MG tablet Take 1 tablet (80 mg total)  by mouth daily. 06/22/21   Burnadette PopAdhikari, Amrit, MD  ?carvedilol (COREG) 25 MG tablet Take 1 tablet (25 mg total) by mouth 2 (two) times daily with a meal. 06/21/21 08/31/21  Burnadette PopAdhikari, Amrit, MD  ?cyclobenzaprine (FLEXERIL) 5 MG tablet Take 1.5 tablets (7.5 mg total) by mouth 3 (three) times daily as needed for muscle spasms. 06/21/21   Burnadette PopAdhikari, Amrit, MD  ?hydrALAZINE (APRESOLINE) 100 MG tablet Take 1 tablet (100 mg total) by mouth every 8 (eight) hours. 06/21/21   Burnadette PopAdhikari,  Amrit, MD  ?Insulin Glargine (BASAGLAR KWIKPEN) 100 UNIT/ML Inject 30 Units into the skin daily. PLEASE MAKE APPOINTMENT 08/15/21   Hoy RegisterNewlin, Enobong, MD  ?Insulin Pen Needle (TRUEPLUS 5-BEVEL PEN NEEDLES) 32G X 4 MM MISC Use to inject basaglar once daily. 08/15/21   Hoy RegisterNewlin, Enobong, MD  ?omeprazole (PRILOSEC) 40 MG capsule Take 1 capsule (40 mg total) by mouth daily. 06/21/21   Burnadette PopAdhikari, Amrit, MD  ?sildenafil (VIAGRA) 100 MG tablet TAKE 0.5-1 TABLETS (50-100 MG TOTAL) BY MOUTH DAILY AS NEEDED FOR ERECTILE DYSFUNCTION. ?Patient taking differently: Take 50-100 mg by mouth daily as needed for erectile dysfunction. 01/12/21 01/12/22  Georganna SkeansWilson, Amelia, MD  ?sucralfate (CARAFATE) 1 g tablet Take 1 tablet (1 g total) by mouth 4 (four) times daily -  with meals and at bedtime for 7 days. 08/01/21 08/08/21  Sloan LeiterGray, Samuel A, DO  ? ? ?Physical Exam: ?Vitals:  ? 09/11/21 2100 09/11/21 2111 09/11/21 2112 09/11/21 2155  ?BP: (!) 222/110 (!) 220/110 (!) 220/108   ?Pulse:  (!) 107    ?Resp:  18    ?Temp:    98.3 ?F (36.8 ?C)  ?TempSrc:    Oral  ?SpO2:  97%    ?Weight:      ?Height:      ? ?Constitutional: Somnolent, resting in bed with head elevated.  Calm, comfortable ?Eyes: PERRL, lids and conjunctivae normal ?ENMT: Mucous membranes are dry. Posterior pharynx clear of any exudate or lesions. ?Neck: normal, supple, no masses. ?Respiratory: clear to auscultation bilaterally, no wheezing, no crackles. Normal respiratory effort. No accessory muscle use.  ?Cardiovascular: Regular rate and rhythm, systolic murmur present.  No extremity edema. 2+ pedal pulses. ?Abdomen: no tenderness, no masses palpated. No hepatosplenomegaly.  ?Musculoskeletal: no clubbing / cyanosis. No joint deformity upper and lower extremities. Good ROM, no contractures. Normal muscle tone.  ?Skin: no rashes, lesions, ulcers. No induration ?Neurologic: Sensation intact. Strength equal bilaterally. ?Psychiatric: Somnolent after receiving several sedating medications. ? ?EKG:  Personally reviewed. Sinus rhythm, rate 101, no acute ischemic changes.  Rate is faster when compared to prior. ? ?Assessment/Plan ?Principal Problem: ?  Hypertensive urgency ?Active Problems: ?  Nausea & vomiting ?  Type 2 diabetes mellitus without complication, with long-term current use of insulin (HCC) ?  Hyperlipidemia associated with type 2 diabetes mellitus (HCC) ?  ?Randy R Tommi Emerydside Jr. is a 41 y.o. male with medical history significant for insulin-dependent T2DM, HTN, HLD, recurrent pancreatitis, marijuana use who is admitted with hypertensive urgency. ? ?Assessment and Plan: ?* Hypertensive urgency ?Remains hypertensive after resuming home oral meds and IV push of labetalol and hydralazine.  No chest pain, dyspnea on admission. ?-Give clonidine 0.2 mg once now, can redose 0.1 mg x 2 if needed ?-IV labetalol 10 mg q2h prn ?-Resume home amlodipine 10 mg daily, Coreg 25 mg BID, hydralazine 100 mg TID ? ?Nausea & vomiting ?Improved after receiving IV Ativan, Dilaudid, and droperidol although somnolent due to sedative effect. ? ?Type 2 diabetes mellitus without  complication, with long-term current use of insulin (HCC) ?Serum glucose 92 on admission.  Place on SSI with HS coverage. ? ?Hyperlipidemia associated with type 2 diabetes mellitus (HCC) ?Continue atorvastatin. ? ?DVT prophylaxis: enoxaparin (LOVENOX) injection 40 mg Start: 09/11/21 2200 ?Code Status: Full code ?Family Communication: Discussed with patient, he has discussed with family ?Disposition Plan: From home and likely discharge to home pending clinical progress ?Consults called: None ?Severity of Illness: ?The appropriate patient status for this patient is OBSERVATION. Observation status is judged to be reasonable and necessary in order to provide the required intensity of service to ensure the patient's safety. The patient's presenting symptoms, physical exam findings, and initial radiographic and laboratory data in the context of their medical  condition is felt to place them at decreased risk for further clinical deterioration. Furthermore, it is anticipated that the patient will be medically stable for discharge from the hospital within 2 midnights of

## 2021-09-11 NOTE — Assessment & Plan Note (Addendum)
Improved at this time.  Continue Protonix on discharge. ?

## 2021-09-12 ENCOUNTER — Encounter (HOSPITAL_COMMUNITY): Payer: Self-pay | Admitting: Internal Medicine

## 2021-09-12 DIAGNOSIS — E1169 Type 2 diabetes mellitus with other specified complication: Secondary | ICD-10-CM

## 2021-09-12 DIAGNOSIS — E119 Type 2 diabetes mellitus without complications: Secondary | ICD-10-CM

## 2021-09-12 DIAGNOSIS — E785 Hyperlipidemia, unspecified: Secondary | ICD-10-CM

## 2021-09-12 DIAGNOSIS — R109 Unspecified abdominal pain: Secondary | ICD-10-CM

## 2021-09-12 DIAGNOSIS — R112 Nausea with vomiting, unspecified: Secondary | ICD-10-CM

## 2021-09-12 DIAGNOSIS — Z794 Long term (current) use of insulin: Secondary | ICD-10-CM

## 2021-09-12 DIAGNOSIS — R1013 Epigastric pain: Secondary | ICD-10-CM

## 2021-09-12 LAB — BASIC METABOLIC PANEL
Anion gap: 8 (ref 5–15)
BUN: 18 mg/dL (ref 6–20)
CO2: 22 mmol/L (ref 22–32)
Calcium: 8.5 mg/dL — ABNORMAL LOW (ref 8.9–10.3)
Chloride: 106 mmol/L (ref 98–111)
Creatinine, Ser: 1.44 mg/dL — ABNORMAL HIGH (ref 0.61–1.24)
GFR, Estimated: >60 mL/min (ref >60)
Glucose, Bld: 142 mg/dL — ABNORMAL HIGH (ref 70–99)
Potassium: 4 mmol/L (ref 3.5–5.1)
Sodium: 136 mmol/L (ref 135–145)

## 2021-09-12 LAB — CBC
HCT: 31.3 % — ABNORMAL LOW (ref 39.0–52.0)
Hemoglobin: 10 g/dL — ABNORMAL LOW (ref 13.0–17.0)
MCH: 27.4 pg (ref 26.0–34.0)
MCHC: 31.9 g/dL (ref 30.0–36.0)
MCV: 85.8 fL (ref 80.0–100.0)
Platelets: 278 10*3/uL (ref 150–400)
RBC: 3.65 MIL/uL — ABNORMAL LOW (ref 4.22–5.81)
RDW: 12.9 % (ref 11.5–15.5)
WBC: 7.8 10*3/uL (ref 4.0–10.5)
nRBC: 0 % (ref 0.0–0.2)

## 2021-09-12 LAB — GLUCOSE, CAPILLARY
Glucose-Capillary: 152 mg/dL — ABNORMAL HIGH (ref 70–99)
Glucose-Capillary: 276 mg/dL — ABNORMAL HIGH (ref 70–99)
Glucose-Capillary: 166 mg/dL — ABNORMAL HIGH (ref 70–99)
Glucose-Capillary: 118 mg/dL — ABNORMAL HIGH (ref 70–99)

## 2021-09-12 LAB — CBG MONITORING, ED: Glucose-Capillary: 124 mg/dL — ABNORMAL HIGH (ref 70–99)

## 2021-09-12 MED ORDER — SALINE SPRAY 0.65 % NA SOLN
1.0000 | NASAL | Status: DC | PRN
  Administered 2021-09-12: 1 via NASAL
  Filled 2021-09-12: qty 44

## 2021-09-12 MED ORDER — SUCRALFATE 1 G PO TABS
1.0000 g | ORAL_TABLET | Freq: Three times a day (TID) | ORAL | Status: DC
  Administered 2021-09-12 – 2021-09-13 (×5): 1 g via ORAL
  Filled 2021-09-12 (×5): qty 1

## 2021-09-12 MED ORDER — LORATADINE 10 MG PO TABS
10.0000 mg | ORAL_TABLET | Freq: Every day | ORAL | Status: DC
  Administered 2021-09-12 – 2021-09-13 (×2): 10 mg via ORAL
  Filled 2021-09-12 (×2): qty 1

## 2021-09-12 MED ORDER — OXYCODONE-ACETAMINOPHEN 5-325 MG PO TABS
1.0000 | ORAL_TABLET | Freq: Four times a day (QID) | ORAL | Status: DC | PRN
  Administered 2021-09-12: 2 via ORAL
  Filled 2021-09-12: qty 2

## 2021-09-12 MED ORDER — PANTOPRAZOLE SODIUM 40 MG PO TBEC
40.0000 mg | DELAYED_RELEASE_TABLET | Freq: Every day | ORAL | Status: DC
  Administered 2021-09-12 – 2021-09-13 (×2): 40 mg via ORAL
  Filled 2021-09-12 (×2): qty 1

## 2021-09-12 MED ORDER — FLUTICASONE PROPIONATE 50 MCG/ACT NA SUSP
2.0000 | Freq: Every day | NASAL | Status: DC
  Administered 2021-09-12 – 2021-09-13 (×2): 2 via NASAL
  Filled 2021-09-12: qty 16

## 2021-09-12 MED ORDER — SODIUM CHLORIDE 0.9 % IV SOLN: INTRAVENOUS | Status: AC

## 2021-09-12 MED ORDER — INSULIN GLARGINE-YFGN 100 UNIT/ML ~~LOC~~ SOLN
20.0000 [IU] | Freq: Every day | SUBCUTANEOUS | Status: DC
  Administered 2021-09-12 – 2021-09-13 (×2): 20 [IU] via SUBCUTANEOUS
  Filled 2021-09-12 (×2): qty 0.2

## 2021-09-12 NOTE — Assessment & Plan Note (Addendum)
Patient presented with left lower quadrant pain nausea vomiting.  History of recurrent pancreatitis.  Lipase was 59.  Repeat CT was not performed this admission but due to previous history of pancreatitis he was thought to have pancreatitis related pain and was kept on conservative treatment.  Patient was initially n.p.o. IV fluids followed by gradual advancement in diet.  At this time patient has tolerated diet.   ?

## 2021-09-12 NOTE — Progress Notes (Addendum)
?PROGRESS NOTE ? ? ? ?Autumn R [place].  GUR:[id number] DOB: [birth year] DOA: 09/11/2021 ?PCP: Rema Fendt, NP  ? ? ?Brief Narrative:  ?Randy [name]. is a 41 y.o. male with past medical history of diabetes mellitus type 2, hypertension, hyperlipidemia, history of recurrent pancreatitis and marijuana usage presented to hospital with left lower quadrant abdominal pain with nausea and vomiting.  In the ED patient was somnolent due to sedatives.  Blood pressure was elevated.  He was afebrile.  Labs showed creatinine of 1.2.  LFTs were within normal limits.  Lipase of 59.  This was negative for UTI.  Patient was given supportive care including IV fluids antihypertensives and was admitted to hospital for recurrent pancreatitis and hypertensive urgency.  ? ? ?Assessment and Plan: ?* Abdominal pain ?Presenting with left lower quadrant pain nausea vomiting.  History of recurrent pancreatitis.  Was n.p.o. overnight.  We will try clears.  Still complains of 9 x 10 pain.  Denies any nausea vomiting today.  We will start on clears.  Continue IV fluids.  Add Percocet for pain.  On oxycodone at home. ? ?Hypertensive urgency ?Continue IV labetalol as needed.  Home regimen of amlodipine Coreg and hydralazine has been resumed.  We will continue to monitor.  Slightly better this morning.   ? ?Nausea & vomiting ?Improved after receiving IV Ativan, Dilaudid, and droperidol .  Continue Zofran at this time.  Continue PPI and sucralfate. ? ?Type 2 diabetes mellitus without complication, with long-term current use of insulin with hyperglycemia ?Continue sliding scale insulin.  Patient takes glargine insulin at home.  We will resume at a lower dose.. ? ?Hyperlipidemia associated with type 2 diabetes mellitus (HCC) ?Continue statins from home. ? ? ? DVT prophylaxis: enoxaparin (LOVENOX) injection 40 mg Start: 09/11/21 2200 ? ? ?Code Status:   ?  Code Status: Full Code ? ?Disposition: Home likely by tomorrow if able to tolerate a p.o.  diet. ? ?Status is: Observation ?The patient will require care spanning > 2 midnights and should be moved to inpatient because: IV fluids, analgesics, advancing diet, ? ? Family Communication: Spoke with the patient at bedside. ? ?Consultants:  ?None ? ?Procedures:  ?None ? ?Antimicrobials:  ?None ? ?Anti-infectives (From admission, onward)  ? ? None  ? ?  ? ? ?Subjective: ?Today, patient was seen and examined at bedside.  Patient complains of 10/10 pain over the back area.  Denies any nausea vomiting today.  States that he does have a recurrent pancreatitis but does not drink alcohol.  Feels congested but no cough or shortness of breath. ? ?Objective: ?Vitals:  ? 09/12/21 0220 09/12/21 0220 09/12/21 0236 09/12/21 2831  ?BP: (!) 191/94 (!) 191/94  (!) 155/84  ?Pulse: 100 100  96  ?Resp: 20 20  20   ?Temp: 98.8 ?F (37.1 ?C) 98.8 ?F (37.1 ?C)  99.1 ?F (37.3 ?C)  ?TempSrc: Oral Oral  Oral  ?SpO2: 99% 99%  99%  ?Weight: 87.6 kg  87.6 kg   ?Height: 5\' 8"  (1.727 m)  5\' 8"  (1.727 m)   ? ? ?Intake/Output Summary (Last 24 hours) at 09/12/2021 1018 ?Last data filed at 09/12/2021 0941 ?Gross per 24 hour  ?Intake 600 ml  ?Output 0 ml  ?Net 600 ml  ? ?Filed Weights  ? 09/11/21 0919 09/12/21 0220 09/12/21 0236  ?Weight: 89.8 kg 87.6 kg 87.6 kg  ? ? ?Physical Examination: ? ?General:  Average built, not in obvious distress ?HENT:   No scleral pallor  or icterus noted. Oral mucosa is moist.  ?Chest:  Clear breath sounds.  Diminished breath sounds bilaterally. No crackles or wheezes.  ?CVS: S1 &S2 heard. No murmur.  Regular rate and rhythm. ?Abdomen: Soft, nontender, nondistended.  Epigastric tenderness noted on palpation.  Bowel sounds are heard.   ?Extremities: No cyanosis, clubbing or edema.  Peripheral pulses are palpable. ?Psych: Alert, awake and oriented, normal mood ?CNS:  No cranial nerve deficits.  Power equal in all extremities.   ?Skin: Warm and dry.  No rashes noted. ? ?Data Reviewed:  ? ?CBC: ?Recent Labs  ?Lab 09/11/21 ?1205  09/12/21 ?0353  ?WBC 6.8 7.8  ?HGB 11.4* 10.0*  ?HCT 34.9* 31.3*  ?MCV 84.5 85.8  ?PLT 325 278  ? ? ?Basic Metabolic Panel: ?Recent Labs  ?Lab 09/11/21 ?1205 09/12/21 ?0353  ?NA 137 136  ?K 4.1 4.0  ?CL 107 106  ?CO2 25 22  ?GLUCOSE 92 142*  ?BUN 18 18  ?CREATININE 1.29* 1.44*  ?CALCIUM 8.9 8.5*  ? ? ?Liver Function Tests: ?Recent Labs  ?Lab 09/11/21 ?1205  ?AST 22  ?ALT 21  ?ALKPHOS 113  ?BILITOT 0.8  ?PROT 8.1  ?ALBUMIN 3.7  ? ? ? ?Radiology Studies: ?No results found. ? ? ? LOS: 0 days  ? ? ?Joycelyn Das, MD ?[facility] ?Available via Epic secure chat 7am-7pm ?After these hours, please refer to coverage provider listed on [email or web address] ?09/12/2021, 10:18 AM  ?  ?

## 2021-09-13 ENCOUNTER — Other Ambulatory Visit: Payer: Self-pay

## 2021-09-13 LAB — CBC
HCT: 32.4 % — ABNORMAL LOW (ref 39.0–52.0)
Hemoglobin: 10.6 g/dL — ABNORMAL LOW (ref 13.0–17.0)
MCH: 28 pg (ref 26.0–34.0)
MCHC: 32.7 g/dL (ref 30.0–36.0)
MCV: 85.7 fL (ref 80.0–100.0)
Platelets: 257 10*3/uL (ref 150–400)
RBC: 3.78 MIL/uL — ABNORMAL LOW (ref 4.22–5.81)
RDW: 13 % (ref 11.5–15.5)
WBC: 5.6 10*3/uL (ref 4.0–10.5)
nRBC: 0 % (ref 0.0–0.2)

## 2021-09-13 LAB — COMPREHENSIVE METABOLIC PANEL
ALT: 17 U/L (ref 0–44)
AST: 19 U/L (ref 15–41)
Albumin: 3.3 g/dL — ABNORMAL LOW (ref 3.5–5.0)
Alkaline Phosphatase: 94 U/L (ref 38–126)
Anion gap: 5 (ref 5–15)
BUN: 17 mg/dL (ref 6–20)
CO2: 25 mmol/L (ref 22–32)
Calcium: 8.5 mg/dL — ABNORMAL LOW (ref 8.9–10.3)
Chloride: 110 mmol/L (ref 98–111)
Creatinine, Ser: 1.39 mg/dL — ABNORMAL HIGH (ref 0.61–1.24)
GFR, Estimated: >60 mL/min (ref >60)
Glucose, Bld: 74 mg/dL (ref 70–99)
Potassium: 3.6 mmol/L (ref 3.5–5.1)
Sodium: 140 mmol/L (ref 135–145)
Total Bilirubin: 0.6 mg/dL (ref 0.3–1.2)
Total Protein: 6.7 g/dL (ref 6.5–8.1)

## 2021-09-13 LAB — GLUCOSE, CAPILLARY
Glucose-Capillary: 121 mg/dL — ABNORMAL HIGH (ref 70–99)
Glucose-Capillary: 115 mg/dL — ABNORMAL HIGH (ref 70–99)

## 2021-09-13 LAB — MAGNESIUM: Magnesium: 2 mg/dL (ref 1.7–2.4)

## 2021-09-13 MED ORDER — OMEPRAZOLE 40 MG PO CPDR
40.0000 mg | DELAYED_RELEASE_CAPSULE | Freq: Every day | ORAL | 2 refills | Status: DC
  Filled 2021-09-13: qty 30, 30d supply, fill #0
  Filled 2021-12-28: qty 30, 30d supply, fill #1

## 2021-09-13 MED ORDER — SENNOSIDES-DOCUSATE SODIUM 8.6-50 MG PO TABS
1.0000 | ORAL_TABLET | Freq: Every evening | ORAL | 0 refills | Status: AC | PRN
  Filled 2021-09-13: qty 30, 30d supply, fill #0

## 2021-09-13 MED ORDER — HYDRALAZINE HCL 100 MG PO TABS
100.0000 mg | ORAL_TABLET | Freq: Three times a day (TID) | ORAL | 2 refills | Status: DC
  Filled 2021-09-13: qty 90, 30d supply, fill #0
  Filled 2021-12-28: qty 90, 30d supply, fill #1

## 2021-09-13 MED ORDER — OXYCODONE HCL 5 MG PO TABS: 5.0000 mg | ORAL_TABLET | Freq: Four times a day (QID) | ORAL | 0 refills | Status: DC | PRN

## 2021-09-13 MED ORDER — AMLODIPINE BESYLATE 10 MG PO TABS
10.0000 mg | ORAL_TABLET | Freq: Every day | ORAL | 2 refills | Status: DC
  Filled 2021-09-13: qty 30, 30d supply, fill #0
  Filled 2021-12-28: qty 30, 30d supply, fill #1

## 2021-09-13 MED ORDER — CARVEDILOL 25 MG PO TABS
25.0000 mg | ORAL_TABLET | Freq: Two times a day (BID) | ORAL | 2 refills | Status: DC
  Filled 2021-09-13: qty 60, 30d supply, fill #0
  Filled 2021-12-28: qty 60, 30d supply, fill #1

## 2021-09-13 NOTE — Discharge Summary (Addendum)
Physician Discharge Summary   Patient: Randy [name]. MRN: [id number] DOB: [birth year]  Admit date:     09/11/2021  Discharge date: 09/13/21  Discharge Physician: Rebekah Chesterfield Lee Kuang   PCP: Rema Fendt, NP   Recommendations at discharge:   Follow-up with your primary care provider in 1 week.  Discharge Diagnoses: Active Problems:   Type 2 diabetes mellitus without complication, with long-term current use of insulin (HCC)   Hyperlipidemia associated with type 2 diabetes mellitus (HCC)  Principal Problem (Resolved):   Abdominal pain Resolved Problems:   Hypertensive urgency   Nausea & vomiting  Hospital Course: Randy [name]. is a 41 y.o. male with past medical history of diabetes mellitus type 2, hypertension, hyperlipidemia, history of recurrent pancreatitis and marijuana usage presented to hospital with left lower quadrant abdominal pain with nausea and vomiting.  In the ED, patient was somnolent due to sedatives.  Blood pressure was elevated.  He was afebrile.  Labs showed creatinine of 1.2.  LFTs were within normal limits.  Lipase of 59.  UA was negative for UTI.  Patient was given supportive care including IV fluids, antihypertensives and was admitted to hospital for abdominal pain likely secondary to pancreatitis and hypertensive urgency.  Assessment and Plan: * Abdominal pain-resolved as of 09/13/2021.  Likely secondary to acute on chronic pancreatitis. Patient presented with left lower quadrant pain nausea vomiting.  History of recurrent pancreatitis.  Lipase was 59.  Repeat CT was not performed this admission but due to previous history of pancreatitis he was thought to have pancreatitis related pain and was kept on conservative treatment.  Patient was initially n.p.o. IV fluids followed by gradual advancement in diet.  At this time patient has tolerated diet.    Hypertensive urgency-resolved as of 09/13/2021 Continue home regimen of amlodipine Coreg and hydralazine. Refills  have been given.  Emphasized compliance to medications.  Nausea & vomiting-resolved as of 09/13/2021 Improved at this time.  Continue Protonix on discharge.  Type 2 diabetes mellitus without complication, with long-term current use of insulin (HCC) Received sliding scale insulin at home.  Resume insulin regimen from home on discharge.  Hyperlipidemia associated with type 2 diabetes mellitus (HCC) Resume statins.  Consultants: None  Procedures performed: None  Disposition: Home  Diet recommendation:  Discharge Diet Orders (From admission, onward)     Start     Ordered   09/13/21 0000  Diet Carb Modified        09/13/21 1430           Carb modified diet  DISCHARGE MEDICATION: Allergies as of 09/13/2021       Reactions   Tomato Other (See Comments)   Unknown reaction        Medication List     STOP taking these medications    cyclobenzaprine 5 MG tablet Commonly known as: FLEXERIL   sucralfate 1 g tablet Commonly known as: Carafate       TAKE these medications    amLODipine 10 MG tablet Commonly known as: NORVASC Take 1 tablet (10 mg total) by mouth daily.   atorvastatin 80 MG tablet Commonly known as: LIPITOR Take 1 tablet (80 mg total) by mouth daily.   Basaglar KwikPen 100 UNIT/ML Inject 30 Units into the skin daily. PLEASE MAKE APPOINTMENT What changed: when to take this   carvedilol 25 MG tablet Commonly known as: COREG Take 1 tablet (25 mg total) by mouth 2 (two) times daily with a meal.   hydrALAZINE 100  MG tablet Commonly known as: APRESOLINE Take 1 tablet (100 mg total) by mouth every 8 (eight) hours.   omeprazole 40 MG capsule Commonly known as: PRILOSEC Take 1 capsule (40 mg total) by mouth daily.   ondansetron 4 MG disintegrating tablet Commonly known as: ZOFRAN-ODT Take 1 tablet (4 mg total) by mouth every 8 (eight) hours as needed for nausea or vomiting.   oxyCODONE 5 MG immediate release tablet Commonly known as: Oxy  IR/ROXICODONE Take 1 tablet (5 mg total) by mouth every 6 (six) hours as needed for severe pain. What changed: when to take this   senna-docusate 8.6-50 MG tablet Commonly known as: Senokot-S Take 1 tablet by mouth at bedtime as needed for mild constipation.   sildenafil 100 MG tablet Commonly known as: VIAGRA TAKE 0.5-1 TABLETS (50-100 MG TOTAL) BY MOUTH DAILY AS NEEDED FOR ERECTILE DYSFUNCTION. What changed:  how much to take how to take this when to take this reasons to take this   TRUEplus 5-Bevel Pen Needles 32G X 4 MM Misc Generic drug: Insulin Pen Needle Use to inject basaglar once daily.        Follow-up Information     Rema Fendt, NP In 2 days.   Specialty: Nurse Practitioner Contact information: [address] [place] [address] [phone]                Subjective Today, patient was seen and examined at bedside.  Has tolerated oral diet.  Wishes to go home.  Discharge Exam: Filed Weights   09/11/21 0919 09/12/21 0220 09/12/21 0236  Weight: 89.8 kg 87.6 kg 87.6 kg      09/13/2021    2:26 PM 09/13/2021    1:14 PM 09/13/2021    5:52 AM  Vitals with BMI  Systolic 144 165 440  Diastolic 85 98 80  Pulse  75 85    General:  Average built, not in obvious distress HENT:   No scleral pallor or icterus noted. Oral mucosa is moist.  Chest:  Clear breath sounds.  Diminished breath sounds bilaterally. No crackles or wheezes.  CVS: S1 &S2 heard. No murmur.  Regular rate and rhythm. Abdomen: Soft, nontender, nondistended.  Bowel sounds are heard.   Extremities: No cyanosis, clubbing or edema.  Peripheral pulses are palpable. Psych: Alert, awake and oriented, normal mood CNS:  No cranial nerve deficits.  Power equal in all extremities.   Skin: Warm and dry.  No rashes noted.  Condition at discharge: good  The results of significant diagnostics from this hospitalization (including imaging, microbiology, ancillary and laboratory) are  listed below for reference.   Imaging Studies: No results found.  Microbiology: Results for orders placed or performed during the hospital encounter of 08/01/21  Resp Panel by RT-PCR (Flu A&B, Covid) Nasopharyngeal Swab     Status: None   Collection Time: 08/01/21  8:38 AM   Specimen: Nasopharyngeal Swab; Nasopharyngeal(NP) swabs in vial transport medium  Result Value Ref Range Status   SARS Coronavirus 2 by RT PCR NEGATIVE NEGATIVE Final    Comment: (NOTE) SARS-CoV-2 target nucleic acids are NOT DETECTED.  The SARS-CoV-2 RNA is generally detectable in upper respiratory specimens during the acute phase of infection. The lowest concentration of SARS-CoV-2 viral copies this assay can detect is 138 copies/mL. A negative result does not preclude SARS-Cov-2 infection and should not be used as the sole basis for treatment or other patient management decisions. A negative result may occur with  improper specimen  collection/handling, submission of specimen other than nasopharyngeal swab, presence of viral mutation(s) within the areas targeted by this assay, and inadequate number of viral copies(<138 copies/mL). A negative result must be combined with clinical observations, patient history, and epidemiological information. The expected result is Negative.  Fact Sheet for Patients:  BloggerCourse.com  Fact Sheet for Healthcare Providers:  SeriousBroker.it  This test is no t yet approved or cleared by the [place] FDA and  has been authorized for detection and/or diagnosis of SARS-CoV-2 by FDA under an Emergency Use Authorization (EUA). This EUA will remain  in effect (meaning this test can be used) for the duration of the COVID-19 declaration under Section 564(b)(1) of the Act, 21 U.S.C.section 360bbb-3(b)(1), unless the authorization is terminated  or revoked sooner.       Influenza A by PCR NEGATIVE NEGATIVE Final   Influenza  B by PCR NEGATIVE NEGATIVE Final    Comment: (NOTE) The Xpert Xpress SARS-CoV-2/FLU/RSV plus assay is intended as an aid in the diagnosis of influenza from Nasopharyngeal swab specimens and should not be used as a sole basis for treatment. Nasal washings and aspirates are unacceptable for Xpert Xpress SARS-CoV-2/FLU/RSV testing.  Fact Sheet for Patients: BloggerCourse.com  Fact Sheet for Healthcare Providers: SeriousBroker.it  This test is not yet approved or cleared by the [place] FDA and has been authorized for detection and/or diagnosis of SARS-CoV-2 by FDA under an Emergency Use Authorization (EUA). This EUA will remain in effect (meaning this test can be used) for the duration of the COVID-19 declaration under Section 564(b)(1) of the Act, 21 U.S.C. section 360bbb-3(b)(1), unless the authorization is terminated or revoked.  Performed at [facility], [address], [place], [place] [address]     Labs: CBC: Recent Labs  Lab 09/11/21 1205 09/12/21 0353 09/13/21 0424  WBC 6.8 7.8 5.6  HGB 11.4* 10.0* 10.6*  HCT 34.9* 31.3* 32.4*  MCV 84.5 85.8 85.7  PLT 325 278 257   Basic Metabolic Panel: Recent Labs  Lab 09/11/21 1205 09/12/21 0353 09/13/21 0424  NA 137 136 140  K 4.1 4.0 3.6  CL 107 106 110  CO2 25 22 25   GLUCOSE 92 142* 74  BUN 18 18 17   CREATININE 1.29* 1.44* 1.39*  CALCIUM 8.9 8.5* 8.5*  MG  --   --  2.0   Liver Function Tests: Recent Labs  Lab 09/11/21 1205 09/13/21 0424  AST 22 19  ALT 21 17  ALKPHOS 113 94  BILITOT 0.8 0.6  PROT 8.1 6.7  ALBUMIN 3.7 3.3*   CBG: Recent Labs  Lab 09/12/21 1128 09/12/21 1637 09/12/21 2225 09/13/21 0730 09/13/21 1156  GLUCAP 276* 166* 118* 121* 115*    Discharge time spent: greater than 30 minutes.  Signed: Joycelyn Das, MD [facility] 09/13/2021

## 2021-09-13 NOTE — TOC Progression Note (Signed)
Transition of Care (TOC) - Progression Note  ? ? ?Patient Details  ?Name: Randy [name]. ?MRN: [id number] ?Date of Birth: 20-Aug-1980 ? ?Transition of Care (TOC) CM/SW Contact  ?Geni Bers, RN ?Phone Number: ?09/13/2021, 11:38 AM ? ?Clinical Narrative:    ? ?Transition of Care (TOC) Screening Note ? ? ?Patient Details  ?Name: Randy [name]. ?Date of Birth: 04/16/1981 ? ? ?Transition of Care (TOC) CM/SW Contact:    ?Geni Bers, RN ?Phone Number: ?09/13/2021, 11:38 AM ? ? ? ?Transition of Care Department [facility]) has reviewed patient and no TOC needs have been identified at this time. We will continue to monitor patient advancement through interdisciplinary progression rounds. If new patient transition needs arise, please place a TOC consult. ?  ? ? ?  ?  ? ?Expected Discharge Plan and Services ?  ?  ?  ?  ?  ?                ?  ?  ?  ?  ?  ?  ?  ?  ?  ?  ? ? ?Social Determinants of Health (SDOH) Interventions ?  ? ?Readmission Risk Interventions ? ?  06/20/2021  ? 11:50 AM  ?Readmission Risk Prevention Plan  ?Transportation Screening Complete  ?PCP or Specialist Appt within 3-5 Days Complete  ?HRI or Home Care Consult Complete  ?Social Work Consult for Recovery Care Planning/Counseling Complete  ?Palliative Care Screening Not Applicable  ?Medication Review Oceanographer) Referral to Pharmacy  ? ? ?

## 2021-09-13 NOTE — Progress Notes (Signed)
Patient discharged home.  Discharge instructions explained, patient verbalizes understanding 

## 2021-09-14 ENCOUNTER — Telehealth: Payer: Self-pay

## 2021-09-14 NOTE — Telephone Encounter (Signed)
Transition Care Management Follow-up Telephone Call ?  ?Date of discharge and from where: [facility] 09/13/2021 ?How have you been since you were released from the hospital? Stated he is ok ?Any questions or concerns? No questions/concerns reported.  ?Items Reviewed: ?Did the pt receive and understand the discharge instructions provided? have the instructions and have no questions.  ?Medications obtained and verified? He said medications will obtain medications today  and the hospital staff reviewed them in detail prior to discharge. ?Any new allergies since your discharge? None reported  ?Do you have support at home? Yes, spouse ?Other (ie: DME, Home Health, etc)      ? None ?Functional Questionnaire: (I = Independent and D = Dependent) ?ADL's:  Independent.    ?  ?  ?Follow up appointments reviewed: ?  ??PCP Hospital f/u appt confirmed?NP Amy on 10/02/2021  ??Specialist Hospital f/u appt confirmed? None scheduled at this time  ??Are transportation arrangements needed? have transportation   ??If their condition worsens, is the pt aware to call  their PCP or go to the ED? Yes ??Was the patient provided with contact information for the PCP's office or ED? He has the phone number  ??Was the pt encouraged to call back with questions or concerns?yes ?

## 2021-09-17 ENCOUNTER — Other Ambulatory Visit: Payer: Self-pay | Admitting: Family Medicine

## 2021-09-17 ENCOUNTER — Other Ambulatory Visit: Payer: Self-pay

## 2021-09-18 ENCOUNTER — Other Ambulatory Visit: Payer: Self-pay

## 2021-09-18 MED ORDER — BASAGLAR KWIKPEN 100 UNIT/ML ~~LOC~~ SOPN
30.0000 [IU] | PEN_INJECTOR | Freq: Every day | SUBCUTANEOUS | 0 refills | Status: DC
  Filled 2021-09-18 – 2021-10-15 (×3): qty 9, 30d supply, fill #0

## 2021-09-18 NOTE — Telephone Encounter (Signed)
Appointment schedule 5/23- Rx courtesy extended ?Requested Prescriptions  ?Pending Prescriptions Disp Refills  ?? Insulin Glargine (BASAGLAR KWIKPEN) 100 UNIT/ML 9 mL 0  ?  Sig: Inject 30 Units into the skin daily. PLEASE MAKE APPOINTMENT  ?  ? Endocrinology:  Diabetes - Insulins Failed - 09/17/2021  3:47 PM  ?  ?  Failed - Valid encounter within last 6 months  ?  Recent Outpatient Visits   ?      ? 10 months ago Essential hypertension  ? Primary Care at [facility], Amy J, NP  ? 1 year ago Uncontrolled type 2 diabetes mellitus with hyperglycemia (HCC)  ? Primary Care at [facility], Kandee Keen, MD  ? 1 year ago Uncontrolled type 2 diabetes mellitus with hyperglycemia (HCC)  ? Primary Care at [facility], Kandee Keen, MD  ? 2 years ago Hospital discharge follow-up  ? Primary Care at [facility], Kandee Keen, MD  ? 2 years ago Type 2 diabetes mellitus without complication, without long-term current use of insulin (HCC)  ? Primary Care at [facility], Shea Stakes, NP  ?  ?  ?Future Appointments   ?        ? In 2 weeks Rema Fendt, NP Primary Care at [facility]  ?  ? ?  ?  ?  Passed - HBA1C is between 0 and 7.9 and within 180 days  ?  HbA1c POC (<> result, manual entry)  ?Date Value Ref Range Status  ?05/31/2019 12.8 4.0 - 5.6 % Final  ? ?Hgb A1c MFr Bld  ?Date Value Ref Range Status  ?04/02/2021 7.7 (H) 4.8 - 5.6 % Final  ?  Comment:  ?  (NOTE) ?Pre diabetes:          5.7%-6.4% ? ?Diabetes:              >6.4% ? ?Glycemic control for   <7.0% ?adults with diabetes ?  ?   ?  ?  ? ?

## 2021-09-24 ENCOUNTER — Other Ambulatory Visit: Payer: Self-pay

## 2021-09-25 ENCOUNTER — Other Ambulatory Visit: Payer: Self-pay

## 2021-09-26 ENCOUNTER — Other Ambulatory Visit: Payer: Self-pay

## 2021-09-27 NOTE — Progress Notes (Signed)
TRANSITION OF CARE VISIT   Primary Care Provider (PCP):    [name], NP                                                        [facility]     [address]         [place],  [place]  [address]          Phone: [phone] Fax: [phone]     Date of Admission: 09/11/2021  Date of Discharge: 09/13/2021  Transitions of Care Call: 09/14/2021  Discharged from: [facility]   Discharge Diagnosis:  Active Problems:   Type 2 diabetes mellitus without complication, with long-term current use of insulin (HCC)   Hyperlipidemia associated with type 2 diabetes mellitus (HCC)   Principal Problem (Resolved):   Abdominal pain Resolved Problems:   Hypertensive urgency   Nausea & vomiting   Summary of Admission per MD note:  Hospital Course: Randy [name]. is a 41 y.o. male with past medical history of diabetes mellitus type 2, hypertension, hyperlipidemia, history of recurrent pancreatitis and marijuana usage presented to hospital with left lower quadrant abdominal pain with nausea and vomiting.  In the ED, patient was somnolent due to sedatives.  Blood pressure was elevated.  He was afebrile.  Labs showed creatinine of 1.2.  LFTs were within normal limits.  Lipase of 59.  UA was negative for UTI.  Patient was given supportive care including IV fluids, antihypertensives and was admitted to hospital for abdominal pain likely secondary to pancreatitis and hypertensive urgency.   Assessment and Plan: * Abdominal pain-resolved as of 09/13/2021.  Likely secondary to acute on chronic pancreatitis. Patient presented with left lower quadrant pain nausea vomiting.  History of recurrent pancreatitis.  Lipase was 59.  Repeat CT was not performed this admission but due to previous history of pancreatitis he was thought to have pancreatitis related pain and was kept on conservative treatment.   Patient was initially n.p.o. IV fluids followed by gradual advancement in diet.  At this time patient has tolerated diet.     Hypertensive urgency-resolved as of 09/13/2021 Continue home regimen of amlodipine Coreg and hydralazine. Refills have been given.  Emphasized compliance to medications.   Nausea & vomiting-resolved as of 09/13/2021 Improved at this time.  Continue Protonix on discharge.   Type 2 diabetes mellitus without complication, with long-term current use of insulin (HCC) Received sliding scale insulin at home.  Resume insulin regimen from home on discharge.   Hyperlipidemia associated with type 2 diabetes mellitus (HCC) Resume statins.  TODAY's VISIT Patient accompanied by his mother. 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 Followed by [facility] with plans for surgery soon.  Abdominal pain / history of pancreatitis follow-up: Reports stomach pain ok, manageable. Interested if there is something stronger he can take for stomach discomfort. He is eating and drinking as normal. Having mostly liquid diet.   Hypertension follow-up: Doing well on current regimen. No side effects. No issues/concerns. Denies chest pain, shortness of breath, worst headache of life and additional red flag symptoms.   Nausea and vomiting follow-up: Doing well on current Protonix, no issues/concerns.  Type 2 diabetes mellitus without complication, with long-term current use of insulin follow-up (HCC):  Doing well on current regimen Glipizide and Insulin Glargine. No side effects. No issues/concerns. Home blood sugars 120's - 200's. Reports eating rice.  Hyperlipidemia associated with type 2 diabetes mellitus follow-up [facility]): Doing well on current Atorvastatin, no issues/concerns.   Patient/Caregiver self-reported problems/concerns: see above  MEDICATIONS  Medication Reconciliation conducted with patient/caregiver? (Yes/ No): Yes   New medications prescribed/discontinued  upon discharge? (Yes/No): Yes  Barriers identified related to medications: No  LABS  Lab Reviewed (Yes/No/NA): Yes  PHYSICAL EXAM:  Physical Exam HENT:     Head: Normocephalic and atraumatic.  Eyes:     Extraocular Movements: Extraocular movements intact.     Conjunctiva/sclera: Conjunctivae normal.     Pupils: Pupils are equal, round, and reactive to light.  Cardiovascular:     Rate and Rhythm: Normal rate and regular rhythm.     Pulses: Normal pulses.     Heart sounds: Normal heart sounds.  Pulmonary:     Effort: Pulmonary effort is normal.     Breath sounds: Normal breath sounds.  Musculoskeletal:     Cervical back: Normal range of motion and neck supple.  Neurological:     General: No focal deficit present.     Mental Status: He is alert and oriented to person, place, and time.  Psychiatric:        Mood and Affect: Mood normal.        Behavior: Behavior normal.    Results for orders placed or performed in visit on 10/02/21  POCT glycosylated hemoglobin (Hb A1C)  Result Value Ref Range   Hemoglobin A1C 8.9 (A) 4.0 - 5.6 %   HbA1c POC (<> result, manual entry)     HbA1c, POC (prediabetic range)     HbA1c, POC (controlled diabetic range)      ASSESSMENT AND PLAN: 1. Hospital discharge follow-up: - Reviewed hospital course, current medications, ensured proper follow-up in place, and addressed concerns.   2. Abdominal pain, unspecified abdominal location: 3. History of pancreatitis: - Stable.  - Referral to Gastroenterology for further evaluation and management. - Ambulatory referral to Gastroenterology  4. Type 2 diabetes mellitus without complication, without long-term current use of insulin (HCC): - Hemoglobin A1c today not at goal at 8.9%, goal < 7%. - Patient intolerant to Metformin.  - Continue Glipizide as prescribed.  - Increase Insulin Glargine from 30 units daily to 33 units daily for 7 days. Then 35 units daily continuing. No refills needed as of  present.  - Begin Dapagliflozin Propanediol as prescribed. Counseled on medication adherence and adverse effects. - Discussed the importance of healthy eating habits, low-carbohydrate diet, low-sugar diet, regular aerobic exercise (at least 150 minutes a week as tolerated) and medication compliance to achieve or maintain control of diabetes. - Follow-up with clinical pharmacist in 4 weeks for diabetes checkup. Write your home blood sugar results down each day and bring those results to your appointment along with your home glucose monitor. Medications may be revised at that time if needed. - Follow-up with primary provider as scheduled.  - POCT glycosylated hemoglobin (Hb A1C) - dapagliflozin propanediol (FARXIGA) 5 MG TABS tablet; Take 1 tablet (5 mg total) by mouth daily before breakfast.  Dispense: 30 tablet; Refill: 1 - glipiZIDE (GLUCOTROL) 10 MG tablet; Take 1 tablet (10 mg total) by mouth 2 (two) times daily before a meal.  Dispense: 60 tablet; Refill: 2  5. Hyperlipidemia associated with type 2 diabetes mellitus (HCC): - Continue Atorvastatin as prescribed.  - Follow-up with primary provider  as scheduled. - atorvastatin (LIPITOR) 80 MG tablet; Take 1 tablet (80 mg total) by mouth daily.  Dispense: 90 tablet; Refill: 1  6. Essential (primary) hypertension: - Blood pressure not at goal during today's visit. Patient asymptomatic without chest pressure, chest pain, palpitations, shortness of breath, worst headache of life, and any additional red flag symptoms. - Continue Amlodipine, Carvedilol, and Hydralazine as prescribed. No refills needed as of present. - Begin Hydrochlorothiazide as prescribed. Counseled on medication adherence and adverse effects.  - Counseled on blood pressure goal of less than 130/80, low-sodium, DASH diet, medication compliance, and 150 minutes of moderate intensity exercise per week as tolerated. Counseled on medication adherence and adverse effects. - Follow-up with  clinical pharmacist in 4 weeks for blood pressure check. Write down your blood pressure readings each day and bring those results along with your home blood pressure monitor to your appointment. Medications may be adjusted at that time if needed. - BMP to be checked at 4 week pharmacist visit. Forward results to primary provider.  - hydrochlorothiazide (HYDRODIURIL) 12.5 MG tablet; Take 1 tablet (12.5 mg total) by mouth daily.  Dispense: 30 tablet; Refill: 1 - Basic Metabolic Panel; Future    PATIENT EDUCATION PROVIDED: See AVS   FOLLOW-UP (Include any further testing or referrals):  - Referral to Gastroenterology.  - Referral to clinical pharmacist. - Follow-up with primary provider as scheduled.

## 2021-10-02 ENCOUNTER — Other Ambulatory Visit: Payer: Self-pay

## 2021-10-02 ENCOUNTER — Encounter: Payer: Self-pay | Admitting: Family

## 2021-10-02 ENCOUNTER — Ambulatory Visit (INDEPENDENT_AMBULATORY_CARE_PROVIDER_SITE_OTHER): Payer: Self-pay | Admitting: Family

## 2021-10-02 VITALS — BP 152/88 | HR 70 | Temp 98.3°F | Resp 18 | Ht 67.99 in | Wt 200.0 lb

## 2021-10-02 DIAGNOSIS — I10 Essential (primary) hypertension: Secondary | ICD-10-CM

## 2021-10-02 DIAGNOSIS — Z09 Encounter for follow-up examination after completed treatment for conditions other than malignant neoplasm: Secondary | ICD-10-CM

## 2021-10-02 DIAGNOSIS — R109 Unspecified abdominal pain: Secondary | ICD-10-CM

## 2021-10-02 DIAGNOSIS — E785 Hyperlipidemia, unspecified: Secondary | ICD-10-CM

## 2021-10-02 DIAGNOSIS — E1169 Type 2 diabetes mellitus with other specified complication: Secondary | ICD-10-CM

## 2021-10-02 DIAGNOSIS — Z8719 Personal history of other diseases of the digestive system: Secondary | ICD-10-CM

## 2021-10-02 DIAGNOSIS — E119 Type 2 diabetes mellitus without complications: Secondary | ICD-10-CM

## 2021-10-02 LAB — POCT GLYCOSYLATED HEMOGLOBIN (HGB A1C): Hemoglobin A1C: 8.9 % — AB (ref 4.0–5.6)

## 2021-10-02 MED ORDER — ATORVASTATIN CALCIUM 80 MG PO TABS
80.0000 mg | ORAL_TABLET | Freq: Every day | ORAL | 1 refills | Status: DC
  Filled 2021-10-02 – 2021-10-16 (×2): qty 30, 30d supply, fill #0

## 2021-10-02 MED ORDER — HYDROCHLOROTHIAZIDE 12.5 MG PO TABS
12.5000 mg | ORAL_TABLET | Freq: Every day | ORAL | 1 refills | Status: DC
  Filled 2021-10-02 – 2021-10-16 (×2): qty 30, 30d supply, fill #0

## 2021-10-02 MED ORDER — DAPAGLIFLOZIN PROPANEDIOL 5 MG PO TABS
5.0000 mg | ORAL_TABLET | Freq: Every day | ORAL | 1 refills | Status: DC
  Filled 2021-10-02 – 2021-12-28 (×4): qty 30, 30d supply, fill #0

## 2021-10-02 MED ORDER — GLIPIZIDE 10 MG PO TABS
10.0000 mg | ORAL_TABLET | Freq: Two times a day (BID) | ORAL | 2 refills | Status: DC
  Filled 2021-10-02 – 2021-10-16 (×2): qty 60, 30d supply, fill #0

## 2021-10-02 NOTE — Progress Notes (Signed)
Diabetes discussed in office.

## 2021-10-02 NOTE — Progress Notes (Signed)
Pt presents for hospitalization f/u, pt is accompanied by his mother states that he is legally blind now for about 3-4 weeks

## 2021-10-03 ENCOUNTER — Other Ambulatory Visit: Payer: Self-pay

## 2021-10-15 ENCOUNTER — Other Ambulatory Visit: Payer: Self-pay | Admitting: Pharmacist

## 2021-10-15 ENCOUNTER — Other Ambulatory Visit: Payer: Self-pay | Admitting: Family

## 2021-10-15 ENCOUNTER — Other Ambulatory Visit: Payer: Self-pay

## 2021-10-15 ENCOUNTER — Ambulatory Visit: Payer: Self-pay | Admitting: Pharmacist

## 2021-10-15 MED ORDER — ACCU-CHEK GUIDE VI STRP
ORAL_STRIP | 2 refills | Status: DC
  Filled 2021-10-15 – 2021-12-28 (×2): qty 100, 33d supply, fill #0

## 2021-10-16 ENCOUNTER — Other Ambulatory Visit: Payer: Self-pay

## 2021-10-19 ENCOUNTER — Other Ambulatory Visit: Payer: Self-pay

## 2021-10-23 ENCOUNTER — Other Ambulatory Visit: Payer: Self-pay

## 2021-11-01 ENCOUNTER — Ambulatory Visit: Payer: Self-pay | Admitting: Physician Assistant

## 2021-11-06 ENCOUNTER — Ambulatory Visit: Payer: Self-pay | Admitting: Pharmacist

## 2021-12-28 ENCOUNTER — Other Ambulatory Visit: Payer: Self-pay | Admitting: Family Medicine

## 2021-12-28 ENCOUNTER — Other Ambulatory Visit: Payer: Self-pay

## 2021-12-28 DIAGNOSIS — E1165 Type 2 diabetes mellitus with hyperglycemia: Secondary | ICD-10-CM

## 2021-12-28 MED ORDER — BASAGLAR KWIKPEN 100 UNIT/ML ~~LOC~~ SOPN
30.0000 [IU] | PEN_INJECTOR | Freq: Every day | SUBCUTANEOUS | 0 refills | Status: DC
  Filled 2021-12-28: qty 9, 30d supply, fill #0

## 2021-12-28 NOTE — Telephone Encounter (Signed)
Order complete. 

## 2021-12-31 ENCOUNTER — Other Ambulatory Visit: Payer: Self-pay

## 2022-01-03 ENCOUNTER — Inpatient Hospital Stay (HOSPITAL_COMMUNITY)
Admission: EM | Admit: 2022-01-03 | Discharge: 2022-01-06 | DRG: 073 | Disposition: A | Discharge status: Home / Self Care | Payer: Self-pay | Attending: Family Medicine | Admitting: Family Medicine

## 2022-01-03 ENCOUNTER — Other Ambulatory Visit: Payer: Self-pay

## 2022-01-03 ENCOUNTER — Emergency Department (HOSPITAL_COMMUNITY): Payer: Self-pay

## 2022-01-03 ENCOUNTER — Encounter (HOSPITAL_COMMUNITY): Payer: Self-pay

## 2022-01-03 DIAGNOSIS — E1143 Type 2 diabetes mellitus with diabetic autonomic (poly)neuropathy: Principal | ICD-10-CM | POA: Diagnosis present

## 2022-01-03 DIAGNOSIS — K859 Acute pancreatitis without necrosis or infection, unspecified: Secondary | ICD-10-CM | POA: Diagnosis present

## 2022-01-03 DIAGNOSIS — H548 Legal blindness, as defined in USA: Secondary | ICD-10-CM | POA: Diagnosis present

## 2022-01-03 DIAGNOSIS — E669 Obesity, unspecified: Secondary | ICD-10-CM

## 2022-01-03 DIAGNOSIS — E119 Type 2 diabetes mellitus without complications: Secondary | ICD-10-CM

## 2022-01-03 DIAGNOSIS — Z79899 Other long term (current) drug therapy: Secondary | ICD-10-CM

## 2022-01-03 DIAGNOSIS — R0789 Other chest pain: Principal | ICD-10-CM

## 2022-01-03 DIAGNOSIS — Z794 Long term (current) use of insulin: Secondary | ICD-10-CM

## 2022-01-03 DIAGNOSIS — K8681 Exocrine pancreatic insufficiency: Secondary | ICD-10-CM | POA: Diagnosis present

## 2022-01-03 DIAGNOSIS — K861 Other chronic pancreatitis: Secondary | ICD-10-CM | POA: Diagnosis present

## 2022-01-03 DIAGNOSIS — R112 Nausea with vomiting, unspecified: Secondary | ICD-10-CM

## 2022-01-03 DIAGNOSIS — E860 Dehydration: Secondary | ICD-10-CM | POA: Diagnosis present

## 2022-01-03 DIAGNOSIS — F1210 Cannabis abuse, uncomplicated: Secondary | ICD-10-CM | POA: Diagnosis present

## 2022-01-03 DIAGNOSIS — Z8249 Family history of ischemic heart disease and other diseases of the circulatory system: Secondary | ICD-10-CM

## 2022-01-03 DIAGNOSIS — N179 Acute kidney failure, unspecified: Secondary | ICD-10-CM | POA: Diagnosis present

## 2022-01-03 DIAGNOSIS — I160 Hypertensive urgency: Secondary | ICD-10-CM

## 2022-01-03 DIAGNOSIS — I10 Essential (primary) hypertension: Secondary | ICD-10-CM | POA: Diagnosis present

## 2022-01-03 DIAGNOSIS — Z87891 Personal history of nicotine dependence: Secondary | ICD-10-CM

## 2022-01-03 DIAGNOSIS — Z833 Family history of diabetes mellitus: Secondary | ICD-10-CM

## 2022-01-03 DIAGNOSIS — R109 Unspecified abdominal pain: Secondary | ICD-10-CM

## 2022-01-03 DIAGNOSIS — E785 Hyperlipidemia, unspecified: Secondary | ICD-10-CM | POA: Diagnosis present

## 2022-01-03 DIAGNOSIS — E11319 Type 2 diabetes mellitus with unspecified diabetic retinopathy without macular edema: Secondary | ICD-10-CM | POA: Diagnosis present

## 2022-01-03 DIAGNOSIS — E871 Hypo-osmolality and hyponatremia: Secondary | ICD-10-CM

## 2022-01-03 DIAGNOSIS — R1013 Epigastric pain: Secondary | ICD-10-CM

## 2022-01-03 DIAGNOSIS — Z91018 Allergy to other foods: Secondary | ICD-10-CM

## 2022-01-03 DIAGNOSIS — K3184 Gastroparesis: Secondary | ICD-10-CM | POA: Diagnosis present

## 2022-01-03 DIAGNOSIS — E1165 Type 2 diabetes mellitus with hyperglycemia: Secondary | ICD-10-CM

## 2022-01-03 DIAGNOSIS — E1169 Type 2 diabetes mellitus with other specified complication: Secondary | ICD-10-CM | POA: Diagnosis present

## 2022-01-03 DIAGNOSIS — Z6830 Body mass index (BMI) 30.0-30.9, adult: Secondary | ICD-10-CM

## 2022-01-03 LAB — CBC
HCT: 39 % (ref 39.0–52.0)
Hemoglobin: 12.7 g/dL — ABNORMAL LOW (ref 13.0–17.0)
MCH: 27.4 pg (ref 26.0–34.0)
MCHC: 32.6 g/dL (ref 30.0–36.0)
MCV: 84.2 fL (ref 80.0–100.0)
Platelets: 368 10*3/uL (ref 150–400)
RBC: 4.63 MIL/uL (ref 4.22–5.81)
RDW: 12.2 % (ref 11.5–15.5)
WBC: 9.6 10*3/uL (ref 4.0–10.5)
nRBC: 0 % (ref 0.0–0.2)

## 2022-01-03 LAB — COMPREHENSIVE METABOLIC PANEL
ALT: 11 U/L (ref 0–44)
AST: 21 U/L (ref 15–41)
Albumin: 3.6 g/dL (ref 3.5–5.0)
Alkaline Phosphatase: 95 U/L (ref 38–126)
Anion gap: 9 (ref 5–15)
BUN: 15 mg/dL (ref 6–20)
CO2: 22 mmol/L (ref 22–32)
Calcium: 9.3 mg/dL (ref 8.9–10.3)
Chloride: 100 mmol/L (ref 98–111)
Creatinine, Ser: 1.64 mg/dL — ABNORMAL HIGH (ref 0.61–1.24)
GFR, Estimated: 53 mL/min — ABNORMAL LOW (ref >60)
Glucose, Bld: 233 mg/dL — ABNORMAL HIGH (ref 70–99)
Potassium: 4.9 mmol/L (ref 3.5–5.1)
Sodium: 131 mmol/L — ABNORMAL LOW (ref 135–145)
Total Bilirubin: 1.2 mg/dL (ref 0.3–1.2)
Total Protein: 8 g/dL (ref 6.5–8.1)

## 2022-01-03 LAB — URINALYSIS, ROUTINE W REFLEX MICROSCOPIC
Bacteria, UA: NONE SEEN
Bilirubin Urine: NEGATIVE
Glucose, UA: 50 mg/dL — AB
Ketones, ur: NEGATIVE mg/dL
Leukocytes,Ua: NEGATIVE
Nitrite: NEGATIVE
Protein, ur: 100 mg/dL — AB
Specific Gravity, Urine: 1.02 (ref 1.005–1.030)
pH: 7 (ref 5.0–8.0)

## 2022-01-03 LAB — RAPID URINE DRUG SCREEN, HOSP PERFORMED
Amphetamines: NOT DETECTED
Barbiturates: NOT DETECTED
Benzodiazepines: NOT DETECTED
Cocaine: NOT DETECTED
Opiates: POSITIVE — AB
Tetrahydrocannabinol: POSITIVE — AB

## 2022-01-03 LAB — TROPONIN I (HIGH SENSITIVITY)
Troponin I (High Sensitivity): 26 ng/L — ABNORMAL HIGH (ref <18)
Troponin I (High Sensitivity): 25 ng/L — ABNORMAL HIGH (ref <18)

## 2022-01-03 LAB — ETHANOL: Alcohol, Ethyl (B): <10 mg/dL (ref <10)

## 2022-01-03 LAB — LIPASE, BLOOD: Lipase: 51 U/L (ref 11–51)

## 2022-01-03 MED ORDER — HYDROCHLOROTHIAZIDE 12.5 MG PO TABS
12.5000 mg | ORAL_TABLET | Freq: Every day | ORAL | Status: DC
  Administered 2022-01-03: 12.5 mg via ORAL
  Filled 2022-01-03: qty 1

## 2022-01-03 MED ORDER — CARVEDILOL 12.5 MG PO TABS
25.0000 mg | ORAL_TABLET | Freq: Once | ORAL | Status: AC
Start: 2022-01-03 — End: 2022-01-03
  Administered 2022-01-03: 25 mg via ORAL
  Filled 2022-01-03: qty 2

## 2022-01-03 MED ORDER — ONDANSETRON HCL 4 MG/2ML IJ SOLN
4.0000 mg | Freq: Once | INTRAMUSCULAR | Status: AC
  Administered 2022-01-03: 4 mg via INTRAVENOUS
  Filled 2022-01-03: qty 2

## 2022-01-03 MED ORDER — MORPHINE SULFATE (PF) 4 MG/ML IV SOLN
4.0000 mg | Freq: Once | INTRAVENOUS | Status: AC
  Administered 2022-01-03: 4 mg via INTRAVENOUS
  Filled 2022-01-03: qty 1

## 2022-01-03 MED ORDER — HYDRALAZINE HCL 25 MG PO TABS
100.0000 mg | ORAL_TABLET | Freq: Once | ORAL | Status: AC
  Administered 2022-01-03: 100 mg via ORAL
  Filled 2022-01-03: qty 4

## 2022-01-03 MED ORDER — ONDANSETRON 4 MG PO TBDP
4.0000 mg | ORAL_TABLET | Freq: Once | ORAL | Status: AC
  Administered 2022-01-03: 4 mg via ORAL
  Filled 2022-01-03: qty 1

## 2022-01-03 MED ORDER — IOHEXOL 300 MG/ML  SOLN
80.0000 mL | Freq: Once | INTRAMUSCULAR | Status: AC | PRN
  Administered 2022-01-03: 80 mL via INTRAVENOUS

## 2022-01-03 MED ORDER — LACTATED RINGERS IV SOLN: INTRAVENOUS | Status: DC

## 2022-01-03 MED ORDER — SODIUM CHLORIDE 0.9 % IV BOLUS
1000.0000 mL | Freq: Once | INTRAVENOUS | Status: AC
  Administered 2022-01-03: 1000 mL via INTRAVENOUS

## 2022-01-03 MED ORDER — AMLODIPINE BESYLATE 5 MG PO TABS
10.0000 mg | ORAL_TABLET | Freq: Once | ORAL | Status: AC
Start: 2022-01-03 — End: 2022-01-03
  Administered 2022-01-03: 10 mg via ORAL
  Filled 2022-01-03: qty 2

## 2022-01-03 MED ORDER — ALUM & MAG HYDROXIDE-SIMETH 200-200-20 MG/5ML PO SUSP
30.0000 mL | Freq: Once | ORAL | Status: AC
  Administered 2022-01-03: 30 mL via ORAL
  Filled 2022-01-03: qty 30

## 2022-01-03 MED ORDER — HYDROMORPHONE HCL 1 MG/ML IJ SOLN
1.0000 mg | Freq: Once | INTRAMUSCULAR | Status: AC
  Administered 2022-01-03: 1 mg via INTRAVENOUS
  Filled 2022-01-03: qty 1

## 2022-01-03 MED ORDER — ONDANSETRON HCL 4 MG/2ML IJ SOLN: 4.0000 mg | Freq: Once | INTRAMUSCULAR | Status: DC

## 2022-01-03 NOTE — ED Provider Notes (Signed)
[facility] EMERGENCY DEPARTMENT Provider Note   CSN: [id number] Arrival date & time: 01/03/22  1216     History  Chief Complaint  Patient presents with   Chest Pain    Randy [name]. is a 41 y.o. male.  Pt is a 41 yo male with a pmhx significant for HTN, legally blind, DM2, HLD, and pancreatitis.  Pt said he has not drank any alcohol in over 20 years.  He said he had cp, but that was after he vomited several times this am.  His bp is elevated, but he has no taken any of his meds this morning.  Pt has pain in his upper abdomen consistent with previous pancreatitis flares.  No fevers.  No cough.       Home Medications Prior to Admission medications   Medication Sig Start Date End Date Taking? Authorizing Provider  amLODipine (NORVASC) 10 MG tablet Take 1 tablet (10 mg total) by mouth daily. 09/13/21 09/13/22  Pokhrel, Rebekah Chesterfield, MD  atorvastatin (LIPITOR) 80 MG tablet Take 1 tablet (80 mg total) by mouth daily. 10/02/21 03/31/22  Rema Fendt, NP  carvedilol (COREG) 25 MG tablet Take 1 tablet (25 mg total) by mouth 2 (two) times daily with a meal. 09/13/21 09/13/22  Pokhrel, Rebekah Chesterfield, MD  dapagliflozin propanediol (FARXIGA) 5 MG TABS tablet Take 1 tablet (5 mg total) by mouth daily before breakfast. 10/02/21   Rema Fendt, NP  glipiZIDE (GLUCOTROL) 10 MG tablet Take 1 tablet (10 mg total) by mouth 2 (two) times daily before a meal. 10/02/21 12/31/21  Rema Fendt, NP  glucose blood (ACCU-CHEK GUIDE) test strip Use to check blood sugar 3 times daily. 10/15/21   Hoy Register, MD  hydrALAZINE (APRESOLINE) 100 MG tablet Take 1 tablet (100 mg total) by mouth every 8 (eight) hours. 09/13/21 09/13/22  Pokhrel, Rebekah Chesterfield, MD  hydrochlorothiazide (HYDRODIURIL) 12.5 MG tablet Take 1 tablet (12.5 mg total) by mouth daily. 10/02/21   Rema Fendt, NP  Insulin Glargine [facility] KWIKPEN) 100 UNIT/ML Inject 30 Units into the skin daily. 12/28/21   Rema Fendt, NP  Insulin Pen  Needle (TRUEPLUS 5-BEVEL PEN NEEDLES) 32G X 4 MM MISC Use to inject basaglar once daily. 08/15/21   Hoy Register, MD  omeprazole (PRILOSEC) 40 MG capsule Take 1 capsule (40 mg total) by mouth daily. 09/13/21 09/13/22  Pokhrel, Rebekah Chesterfield, MD  ondansetron (ZOFRAN-ODT) 4 MG disintegrating tablet Take 1 tablet (4 mg total) by mouth every 8 (eight) hours as needed for nausea or vomiting. 09/11/21   Renne Crigler, PA-C  oxyCODONE (OXY IR/ROXICODONE) 5 MG immediate release tablet Take 1 tablet (5 mg total) by mouth every 6 (six) hours as needed for severe pain. 09/13/21   Pokhrel, Rebekah Chesterfield, MD  sildenafil (VIAGRA) 100 MG tablet TAKE 0.5-1 TABLETS (50-100 MG TOTAL) BY MOUTH DAILY AS NEEDED FOR ERECTILE DYSFUNCTION. Patient taking differently: Take 50-100 mg by mouth daily as needed for erectile dysfunction. 01/12/21 01/27/22  Georganna Skeans, MD      Allergies    Tomato    Review of Systems   Review of Systems  Gastrointestinal:  Positive for abdominal pain.  All other systems reviewed and are negative.   Physical Exam Updated Vital Signs BP (!) 205/116   Pulse 87   Temp 99.4 F (37.4 C) (Oral)   Resp 15   Ht 5\' 8"  (1.727 m)   Wt 86.2 kg   SpO2 100%   BMI 28.89 kg/m  Physical  Exam Vitals and nursing note reviewed.  Constitutional:      Appearance: He is well-developed.  HENT:     Head: Normocephalic and atraumatic.  Eyes:     Extraocular Movements: Extraocular movements intact.  Cardiovascular:     Rate and Rhythm: Normal rate and regular rhythm.     Heart sounds: Normal heart sounds.  Pulmonary:     Effort: Pulmonary effort is normal.     Breath sounds: Normal breath sounds.  Abdominal:     General: Bowel sounds are normal.     Palpations: Abdomen is soft.     Tenderness: There is abdominal tenderness in the epigastric area.  Musculoskeletal:        General: Normal range of motion.     Cervical back: Normal range of motion and neck supple.  Skin:    General: Skin is warm.     Capillary  Refill: Capillary refill takes less than 2 seconds.  Neurological:     General: No focal deficit present.     Mental Status: He is alert and oriented to person, place, and time.  Psychiatric:        Mood and Affect: Mood normal.        Behavior: Behavior normal.     ED Results / Procedures / Treatments   Labs (all labs ordered are listed, but only abnormal results are displayed) Labs Reviewed  CBC - Abnormal; Notable for the following components:      Result Value   Hemoglobin 12.7 (*)    All other components within normal limits  COMPREHENSIVE METABOLIC PANEL - Abnormal; Notable for the following components:   Sodium 131 (*)    Glucose, Bld 233 (*)    Creatinine, Ser 1.64 (*)    GFR, Estimated 53 (*)    All other components within normal limits  TROPONIN I (HIGH SENSITIVITY) - Abnormal; Notable for the following components:   Troponin I (High Sensitivity) 26 (*)    All other components within normal limits  LIPASE, BLOOD  ETHANOL  RAPID URINE DRUG SCREEN, HOSP PERFORMED  URINALYSIS, ROUTINE W REFLEX MICROSCOPIC  TROPONIN I (HIGH SENSITIVITY)    EKG EKG Interpretation  Date/Time:  Thursday January 03 2022 12:25:33 EDT Ventricular Rate:  78 PR Interval:  164 QRS Duration: 82 QT Interval:  390 QTC Calculation: 444 R Axis:   61 Text Interpretation: Normal sinus rhythm Normal ECG When compared with ECG of 11-Sep-2021 21:16, PREVIOUS ECG IS PRESENT Since last tracing rate slower Confirmed by Jacalyn Lefevre [id number]) on 01/03/2022 2:57:32 PM  Radiology DG Chest 1 View  Result Date: 01/03/2022 CLINICAL DATA:  Left chest and back pain. History of diabetes and pancreatitis. EXAM: CHEST  1 VIEW COMPARISON:  Radiographs 06/18/2021 and 04/08/2020.  CT 09/12/2020 FINDINGS: 1250 hours. The heart size and mediastinal contours are normal. The lungs are clear. There is no pleural effusion or pneumothorax. No acute osseous findings are identified. IMPRESSION: Stable chest.  No active  cardiopulmonary process. Electronically Signed   By: Carey Bullocks M.D.   On: 01/03/2022 13:03    Procedures Procedures    Medications Ordered in ED Medications  hydrochlorothiazide (HYDRODIURIL) tablet 12.5 mg (12.5 mg Oral Given 01/03/22 1525)  HYDROmorphone (DILAUDID) injection 1 mg (has no administration in time range)  sodium chloride 0.9 % bolus 1,000 mL (1,000 mLs Intravenous New Bag/Given 01/03/22 1531)  morphine (PF) 4 MG/ML injection 4 mg (4 mg Intravenous Given 01/03/22 1532)  ondansetron (ZOFRAN) injection 4 mg (4 mg  Intravenous Given 01/03/22 1532)  alum & mag hydroxide-simeth (MAALOX/MYLANTA) 200-200-20 MG/5ML suspension 30 mL (30 mLs Oral Given 01/03/22 1524)  amLODipine (NORVASC) tablet 10 mg (10 mg Oral Given 01/03/22 1525)  carvedilol (COREG) tablet 25 mg (25 mg Oral Given 01/03/22 1525)  hydrALAZINE (APRESOLINE) tablet 100 mg (100 mg Oral Given 01/03/22 1525)    ED Course/ Medical Decision Making/ A&P                           Medical Decision Making Amount and/or Complexity of Data Reviewed Labs: ordered. Radiology: ordered.  Risk OTC drugs. Prescription drug management.   This patient presents to the ED for concern of abd pain, this involves an extensive number of treatment options, and is a complaint that carries with it a high risk of complications and morbidity.  The differential diagnosis includes pancreatitis, gastritis, ulcers, cardiac   Co morbidities that complicate the patient evaluation  HTN, legally blind, DM2, HLD, and pancreatitis   Additional history obtained:  Additional history obtained from epic chart review External records from outside source obtained and reviewed including son   Lab Tests:  I Ordered, and personally interpreted labs.  The pertinent results include:  cbc nl, cmp nl other than glucose elevated at 233 and Cr elevated at 1.64 (last cr 1.39 on 5/4), trop 26, lip 5   Imaging Studies ordered:  I ordered imaging studies  including cxr and ct abd/pelvis I independently visualized and interpreted imaging which showed  CXR: IMPRESSION:  Stable chest.  No active cardiopulmonary process.  CT abd/pelvis pending at shift change I agree with the radiologist interpretation   Cardiac Monitoring:  The patient was maintained on a cardiac monitor.  I personally viewed and interpreted the cardiac monitored which showed an underlying rhythm of: nsr   Medicines ordered and prescription drug management:  I ordered medication including morphine and zofran  for pain and nausea  Reevaluation of the patient after these medicines showed that the patient improved I have reviewed the patients home medicines and have made adjustments as needed   Test Considered:  ct   Critical Interventions:  Pain control    Problem List / ED Course:  Abd pain:  CT scan is pending.  Lipase is nl, but he's had chronic pancreatitis, so it could still be flared up.  Pt signed out to Dr. Jearld Fenton at shift change. CP:  atypical.  Likely due to vomiting. HTN:  hx htn.  Pt has not taken his meds today.  Regular meds ordered.  BP improved after home meds.   Reevaluation:  After the interventions noted above, I reevaluated the patient and found that they have :improved   Social Determinants of Health:  Lives at home   Dispostion:         Final Clinical Impression(s) / ED Diagnoses Final diagnoses:  Atypical chest pain  Epigastric pain  Hypertension, unspecified type  Nausea and vomiting, unspecified vomiting type  Dehydration    Rx / DC Orders ED Discharge Orders     None         Jacalyn Lefevre, MD 01/03/22 1641

## 2022-01-03 NOTE — ED Provider Triage Note (Signed)
Emergency Medicine Provider Triage Evaluation Note  Randy [name]. , a 41 y.o. male  was evaluated in triage.  Pt complains of chest pain. Report persistent nausea and vomiting this AM follows by stabbing L sided CP.  Has hx of pancreatitis in the past but haven't drank alcohol in 20 years. Does use marijuana regularly.  Denies cardiac hx.  No fever, chills, diarrhea/constipation, sob  Review of Systems  Positive: As above Negative: As above  Physical Exam  BP (!) 196/121 (BP Location: Right Arm)   Pulse 83   Temp 99.3 F (37.4 C) (Oral)   Resp 14   SpO2 100%  Gen:   Awake, no distress   Resp:  Normal effort  MSK:   Moves extremities without difficulty  Other:    Medical Decision Making  Medically screening exam initiated at 12:32 PM.  Appropriate orders placed.  Randy [name]. was informed that the remainder of the evaluation will be completed by another provider, this initial triage assessment does not replace that evaluation, and the importance of remaining in the ED until their evaluation is complete.     Fayrene Helper, PA-C 01/03/22 1236

## 2022-01-03 NOTE — ED Notes (Signed)
Pt.refused trop rn candance aware at

## 2022-01-03 NOTE — ED Provider Notes (Signed)
H/o pancreatitis w/ normal lipase, p/w pain.   High BP but didn't take them today, s/p home BP meds now w/ improved pressures in 160s.   Physical Exam  BP (!) 162/89   Pulse 81   Temp 99.4 F (37.4 C) (Oral)   Resp 15   Ht 5\' 8"  (1.727 m)   Wt 86.2 kg   SpO2 99%   BMI 28.89 kg/m   Physical Exam  Respiratory: Clear to auscultation bilaterally, normal reviewed Cardiac: RRR Abdominal: TTP of epigastric region without rebound tenderness or guarding Skin: Warm and dry Neuro: A&O x4, moves all extremities.  Procedures  Procedures  ED Course / MDM   Clinical Course as of 01/04/22 1618  Thu Jan 03, 2022  1750 CT ABDOMEN PELVIS W CONTRAST 1. No free air, fluid, inflammatory changes, mass or significant lymphadenopathy seen. Bowel-gas pattern is nonobstructive. 2. Apparent mild thickening of the urinary bladder wall. Clinical and laboratory correlation is suggested for possible cystitis. 3. Calcifications of the bilateral ductus deferens, stable and nonspecific.   [HN]  2124 Patient reevaluated. States his pain is 12/10 and is still nauseated. Will give zofran.  Gave all home BP meds and BP still 175/94. [HN]  2315 Patient reevaluated. After zofran/morphine, pain 9/10, BP still >170 mmHg. Per chart review, patient had very similar admission in May 2023. Will consult to internal medicine. [HN]    Clinical Course User Index [HN] June 2023, MD   Medical Decision Making Amount and/or Complexity of Data Reviewed Labs: ordered. Radiology: ordered. Decision-making details documented in ED Course.  Risk OTC drugs. Prescription drug management. Decision regarding hospitalization.   Started IVF 100 cc/hr and admitted to internal medicine for further management.   Loetta Rough, MD        Loetta Rough, MD 01/04/22 [phone]

## 2022-01-03 NOTE — ED Triage Notes (Signed)
Pt presents with CP on the left, non radiating, feels his blood sugar is high and is concerned about BP.  Pt also endorses "flare up of my pancreas"  Pt states he has had n/v but no diarrhea, fever, or chills.  Pt reports he has not drank alcohol in 20 years but has occasional pancreatitis.

## 2022-01-04 ENCOUNTER — Other Ambulatory Visit: Payer: Self-pay

## 2022-01-04 DIAGNOSIS — K861 Other chronic pancreatitis: Secondary | ICD-10-CM

## 2022-01-04 DIAGNOSIS — R1013 Epigastric pain: Secondary | ICD-10-CM

## 2022-01-04 DIAGNOSIS — E871 Hypo-osmolality and hyponatremia: Secondary | ICD-10-CM

## 2022-01-04 DIAGNOSIS — R112 Nausea with vomiting, unspecified: Secondary | ICD-10-CM

## 2022-01-04 DIAGNOSIS — K859 Acute pancreatitis without necrosis or infection, unspecified: Secondary | ICD-10-CM

## 2022-01-04 DIAGNOSIS — F1210 Cannabis abuse, uncomplicated: Secondary | ICD-10-CM

## 2022-01-04 DIAGNOSIS — E785 Hyperlipidemia, unspecified: Secondary | ICD-10-CM

## 2022-01-04 DIAGNOSIS — E1169 Type 2 diabetes mellitus with other specified complication: Secondary | ICD-10-CM

## 2022-01-04 DIAGNOSIS — N179 Acute kidney failure, unspecified: Secondary | ICD-10-CM

## 2022-01-04 DIAGNOSIS — Z794 Long term (current) use of insulin: Secondary | ICD-10-CM

## 2022-01-04 DIAGNOSIS — I160 Hypertensive urgency: Secondary | ICD-10-CM

## 2022-01-04 DIAGNOSIS — E119 Type 2 diabetes mellitus without complications: Secondary | ICD-10-CM

## 2022-01-04 LAB — CBG MONITORING, ED
Glucose-Capillary: 218 mg/dL — ABNORMAL HIGH (ref 70–99)
Glucose-Capillary: 142 mg/dL — ABNORMAL HIGH (ref 70–99)
Glucose-Capillary: 130 mg/dL — ABNORMAL HIGH (ref 70–99)

## 2022-01-04 LAB — BASIC METABOLIC PANEL
Anion gap: 8 (ref 5–15)
BUN: 16 mg/dL (ref 6–20)
CO2: 23 mmol/L (ref 22–32)
Calcium: 8.9 mg/dL (ref 8.9–10.3)
Chloride: 102 mmol/L (ref 98–111)
Creatinine, Ser: 1.67 mg/dL — ABNORMAL HIGH (ref 0.61–1.24)
GFR, Estimated: 52 mL/min — ABNORMAL LOW (ref >60)
Glucose, Bld: 193 mg/dL — ABNORMAL HIGH (ref 70–99)
Potassium: 4.7 mmol/L (ref 3.5–5.1)
Sodium: 133 mmol/L — ABNORMAL LOW (ref 135–145)

## 2022-01-04 LAB — HEMOGLOBIN A1C
Hgb A1c MFr Bld: 7.6 % — ABNORMAL HIGH (ref 4.8–5.6)
Mean Plasma Glucose: 171.42 mg/dL

## 2022-01-04 LAB — CBC
HCT: 35.5 % — ABNORMAL LOW (ref 39.0–52.0)
Hemoglobin: 11.6 g/dL — ABNORMAL LOW (ref 13.0–17.0)
MCH: 27.6 pg (ref 26.0–34.0)
MCHC: 32.7 g/dL (ref 30.0–36.0)
MCV: 84.5 fL (ref 80.0–100.0)
Platelets: 327 10*3/uL (ref 150–400)
RBC: 4.2 MIL/uL — ABNORMAL LOW (ref 4.22–5.81)
RDW: 12.3 % (ref 11.5–15.5)
WBC: 11.1 10*3/uL — ABNORMAL HIGH (ref 4.0–10.5)
nRBC: 0 % (ref 0.0–0.2)

## 2022-01-04 LAB — HIV ANTIBODY (ROUTINE TESTING W REFLEX): HIV Screen 4th Generation wRfx: NONREACTIVE

## 2022-01-04 LAB — GLUCOSE, CAPILLARY: Glucose-Capillary: 187 mg/dL — ABNORMAL HIGH (ref 70–99)

## 2022-01-04 MED ORDER — HYDROMORPHONE HCL 1 MG/ML IJ SOLN
0.5000 mg | INTRAMUSCULAR | Status: DC | PRN
  Administered 2022-01-04 (×3): 0.5 mg via INTRAVENOUS
  Filled 2022-01-04 (×3): qty 1

## 2022-01-04 MED ORDER — SODIUM CHLORIDE 0.9 % IV SOLN: 6.2500 mg | Freq: Four times a day (QID) | INTRAVENOUS | Status: DC | PRN

## 2022-01-04 MED ORDER — PROMETHAZINE HCL 25 MG PO TABS: 25.0000 mg | ORAL_TABLET | Freq: Four times a day (QID) | ORAL | Status: DC | PRN

## 2022-01-04 MED ORDER — METOCLOPRAMIDE HCL 5 MG/ML IJ SOLN
5.0000 mg | Freq: Four times a day (QID) | INTRAMUSCULAR | Status: DC
Start: 2022-01-04 — End: 2022-01-05
  Administered 2022-01-04 – 2022-01-05 (×5): 5 mg via INTRAVENOUS
  Filled 2022-01-04 (×5): qty 2

## 2022-01-04 MED ORDER — INSULIN DETEMIR 100 UNIT/ML ~~LOC~~ SOLN
9.0000 [IU] | Freq: Two times a day (BID) | SUBCUTANEOUS | Status: DC
  Administered 2022-01-04 – 2022-01-06 (×5): 9 [IU] via SUBCUTANEOUS
  Filled 2022-01-04 (×6): qty 0.09

## 2022-01-04 MED ORDER — HYDROMORPHONE HCL 1 MG/ML IJ SOLN
0.5000 mg | Freq: Four times a day (QID) | INTRAMUSCULAR | Status: DC | PRN
  Administered 2022-01-05 (×2): 0.5 mg via INTRAVENOUS
  Filled 2022-01-04 (×3): qty 1

## 2022-01-04 MED ORDER — PROMETHAZINE HCL 25 MG RE SUPP: 25.0000 mg | Freq: Four times a day (QID) | RECTAL | Status: DC | PRN

## 2022-01-04 MED ORDER — CARVEDILOL 25 MG PO TABS
25.0000 mg | ORAL_TABLET | Freq: Two times a day (BID) | ORAL | Status: DC
  Administered 2022-01-04 – 2022-01-06 (×5): 25 mg via ORAL
  Filled 2022-01-04 (×2): qty 1
  Filled 2022-01-04: qty 2
  Filled 2022-01-04: qty 1
  Filled 2022-01-04: qty 2

## 2022-01-04 MED ORDER — OXYCODONE HCL 5 MG PO TABS
5.0000 mg | ORAL_TABLET | ORAL | Status: DC | PRN
  Administered 2022-01-05 – 2022-01-06 (×5): 5 mg via ORAL
  Filled 2022-01-04 (×5): qty 1

## 2022-01-04 MED ORDER — ENOXAPARIN SODIUM 40 MG/0.4ML IJ SOSY
40.0000 mg | PREFILLED_SYRINGE | Freq: Every day | INTRAMUSCULAR | Status: DC
  Administered 2022-01-04 – 2022-01-06 (×3): 40 mg via SUBCUTANEOUS
  Filled 2022-01-04 (×3): qty 0.4

## 2022-01-04 MED ORDER — LABETALOL HCL 5 MG/ML IV SOLN
10.0000 mg | INTRAVENOUS | Status: DC | PRN
Start: 2022-01-04 — End: 2022-01-06
  Administered 2022-01-04 – 2022-01-06 (×3): 10 mg via INTRAVENOUS
  Filled 2022-01-04 (×3): qty 4

## 2022-01-04 MED ORDER — INSULIN ASPART 100 UNIT/ML IJ SOLN
0.0000 [IU] | Freq: Three times a day (TID) | INTRAMUSCULAR | Status: DC
  Administered 2022-01-04: 3 [IU] via SUBCUTANEOUS
  Administered 2022-01-04: 2 [IU] via SUBCUTANEOUS
  Administered 2022-01-04: 5 [IU] via SUBCUTANEOUS
  Administered 2022-01-04: 2 [IU] via SUBCUTANEOUS
  Administered 2022-01-05: 3 [IU] via SUBCUTANEOUS
  Administered 2022-01-05: 2 [IU] via SUBCUTANEOUS
  Administered 2022-01-05: 3 [IU] via SUBCUTANEOUS
  Administered 2022-01-05 – 2022-01-06 (×2): 2 [IU] via SUBCUTANEOUS

## 2022-01-04 MED ORDER — PANTOPRAZOLE SODIUM 40 MG IV SOLR
40.0000 mg | Freq: Every day | INTRAVENOUS | Status: DC
  Administered 2022-01-04 – 2022-01-05 (×3): 40 mg via INTRAVENOUS
  Filled 2022-01-04 (×3): qty 10

## 2022-01-04 MED ORDER — HYDRALAZINE HCL 50 MG PO TABS
100.0000 mg | ORAL_TABLET | Freq: Three times a day (TID) | ORAL | Status: DC
  Administered 2022-01-04 – 2022-01-06 (×9): 100 mg via ORAL
  Filled 2022-01-04 (×12): qty 2

## 2022-01-04 MED ORDER — ONDANSETRON HCL 4 MG/2ML IJ SOLN
4.0000 mg | Freq: Four times a day (QID) | INTRAMUSCULAR | Status: DC | PRN
  Administered 2022-01-04 – 2022-01-06 (×3): 4 mg via INTRAVENOUS
  Filled 2022-01-04 (×3): qty 2

## 2022-01-04 MED ORDER — AMLODIPINE BESYLATE 10 MG PO TABS
10.0000 mg | ORAL_TABLET | Freq: Every day | ORAL | Status: DC
  Administered 2022-01-04 – 2022-01-06 (×3): 10 mg via ORAL
  Filled 2022-01-04 (×2): qty 1
  Filled 2022-01-04: qty 2

## 2022-01-04 MED ORDER — ATORVASTATIN CALCIUM 80 MG PO TABS
80.0000 mg | ORAL_TABLET | Freq: Every day | ORAL | Status: DC
  Administered 2022-01-04 – 2022-01-06 (×3): 80 mg via ORAL
  Filled 2022-01-04 (×2): qty 1
  Filled 2022-01-04: qty 2

## 2022-01-04 NOTE — Hospital Course (Signed)
Randy [name]. is Randy [name] 41 y.o. 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  Suspect related to diabetic gastroparesis?  Discharged on reglan, follow outpatient.  See below for additional details

## 2022-01-04 NOTE — H&P (Signed)
History and Physical    Patient: Randy [name]. OFB:[id number] DOB: 1980/06/24 DOA: 01/03/2022 DOS: the patient was seen and examined on 01/04/2022 PCP: Rema Fendt, NP  Patient coming from: Home  Chief Complaint:  Chief Complaint  Patient presents with   Chest Pain   HPI: Randy [name]. is a 41 y.o. 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  Reports mid-back pain with radiation to his mid abdomen for the past 3 to 4 days.  Began to have nausea and vomiting yesterday.  Feels similar to prior pancreatitis episodes.  Unknown trigger and has not drink alcohol for least 20 years.  Has been taking his mother's Percocet to help with pain relief.  Denies any NSAID use.  No diarrhea.  No dysuria, urgency or frequency.  Last marijuana use about 3 days ago. Reports missing all of his medication including insulin for the past 3 weeks since he was in [place], [place] for his brother's funeral.  In the ED he was afebrile with BP up to SBP of 205/116.  No leukocytosis, hemoglobin of 12.7.  Mild hyponatremia with sodium of 131, glucose of 233, creatinine of 1.64 up from 1.39. Troponin of 25. Normal lipase and LFTs. UA is negative for infection.  UDS positive for opioids and THC.  Chest x-ray is negative. CT of the abdomen/pelvis without any inflammatory changes or obstruction.  And mild thickening of urinary bladder wall.  No noted inflammatory changes around the pancreas.  EKG with normal sinus rhythm with peaked T waves on my review.  He was resumed on his home antihypertensives and also given antiemetics, opioids and fluid in the ED.  Hospitalist then called for admission for management of persistent abdominal pain and nausea and vomiting. Review of Systems: As mentioned in the history  of present illness. All other systems reviewed and are negative. Past Medical History:  Diagnosis Date   Essential hypertension 05/31/2019   HLD (hyperlipidemia)    Pancreatitis    Type II diabetes mellitus (HCC)    Past Surgical History:  Procedure Laterality Date   NO PAST SURGERIES     Social History:  reports that he quit smoking about 10 years ago. His smoking use included cigarettes. He has a 5.61 pack-year smoking history. He has been exposed to tobacco smoke. He has never used smokeless tobacco. He reports current drug use. Drug: Marijuana. He reports that he does not drink alcohol.  Allergies  Allergen Reactions   Tomato Other (See Comments)    Unknown reaction    Family History  Problem Relation Age of Onset   Hypertension Mother    Diabetes Father    Hypertension Father    Diabetes Brother    Diabetes Maternal Grandmother    Colon cancer Neg Hx    Esophageal cancer Neg Hx    Rectal cancer Neg Hx    Stomach cancer Neg Hx     Prior to Admission medications   Medication Sig Start Date End Date Taking? Authorizing Provider  amLODipine (NORVASC) 10 MG tablet Take 1 tablet (10 mg total) by mouth daily. Patient not taking: Reported on 01/04/2022 09/13/21 09/13/22  Joycelyn Das, MD  atorvastatin (LIPITOR) 80 MG tablet Take 1 tablet (80 mg total) by mouth daily. Patient not taking: Reported on 01/04/2022 10/02/21 03/31/22  Rema Fendt, NP  carvedilol (  COREG) 25 MG tablet Take 1 tablet (25 mg total) by mouth 2 (two) times daily with a meal. Patient not taking: Reported on 01/04/2022 09/13/21 09/13/22  Joycelyn Das, MD  dapagliflozin propanediol (FARXIGA) 5 MG TABS tablet Take 1 tablet (5 mg total) by mouth daily before breakfast. Patient not taking: Reported on 01/04/2022 10/02/21   Rema Fendt, NP  glipiZIDE (GLUCOTROL) 10 MG tablet Take 1 tablet (10 mg total) by mouth 2 (two) times daily before a meal. Patient not taking: Reported on 01/04/2022 10/02/21 12/31/21  Rema Fendt, NP  glucose blood (ACCU-CHEK GUIDE) test strip Use to check blood sugar 3 times daily. 10/15/21   Hoy Register, MD  hydrALAZINE (APRESOLINE) 100 MG tablet Take 1 tablet (100 mg total) by mouth every 8 (eight) hours. Patient not taking: Reported on 01/04/2022 09/13/21 09/13/22  Joycelyn Das, MD  hydrochlorothiazide (HYDRODIURIL) 12.5 MG tablet Take 1 tablet (12.5 mg total) by mouth daily. Patient not taking: Reported on 01/04/2022 10/02/21   Rema Fendt, NP  Insulin Glargine [facility]) 100 UNIT/ML Inject 30 Units into the skin daily. Patient not taking: Reported on 01/04/2022 12/28/21   Rema Fendt, NP  Insulin Pen Needle (TRUEPLUS 5-BEVEL PEN NEEDLES) 32G X 4 MM MISC Use to inject basaglar once daily. 08/15/21   Hoy Register, MD  omeprazole (PRILOSEC) 40 MG capsule Take 1 capsule (40 mg total) by mouth daily. Patient not taking: Reported on 01/04/2022 09/13/21 09/13/22  Pokhrel, Rebekah Chesterfield, MD  ondansetron (ZOFRAN-ODT) 4 MG disintegrating tablet Take 1 tablet (4 mg total) by mouth every 8 (eight) hours as needed for nausea or vomiting. Patient not taking: Reported on 01/04/2022 09/11/21   Renne Crigler, PA-C  oxyCODONE (OXY IR/ROXICODONE) 5 MG immediate release tablet Take 1 tablet (5 mg total) by mouth every 6 (six) hours as needed for severe pain. Patient not taking: Reported on 01/04/2022 09/13/21   Pokhrel, Rebekah Chesterfield, MD  sildenafil (VIAGRA) 100 MG tablet TAKE 0.5-1 TABLETS (50-100 MG TOTAL) BY MOUTH DAILY AS NEEDED FOR ERECTILE DYSFUNCTION. Patient not taking: Reported on 01/04/2022 01/12/21 01/27/22  Georganna Skeans, MD    Physical Exam: Vitals:   01/04/22 0030 01/04/22 0045 01/04/22 0115 01/04/22 0140  BP: (!) 166/79 (!) 167/100 (!) 192/95   Pulse: 83 90 97   Resp: (!) 26 12 20    Temp:    98.7 F (37.1 C)  TempSrc:    Oral  SpO2: 99% 93% 100%   Weight:      Height:       Constitutional: NAD, calm, comfortable, nontoxic appearing middle-age male laying flat in bed Eyes: lids and  conjunctivae normal ENMT: Mucous membranes are moist.  Neck: normal, supple Respiratory: clear to auscultation bilaterally, no wheezing, no crackles. Normal respiratory effort. No accessory muscle use.  Cardiovascular: Regular rate and rhythm, no murmurs / rubs / gallops. No extremity edema.  Abdomen: Mild mid abdominal pain, nondistended, no mass palpated. Bowel sounds positive.  Musculoskeletal: no clubbing / cyanosis. No joint deformity upper and lower extremities.  Left-sided thoracic paraspinal musculature pain. Skin: no rashes, lesions, ulcers.  Neurologic: CN 2-12 grossly intact. Sensation intact. Strength 5/5 in all 4.  Psychiatric: Normal judgment and insight. Alert and oriented x 3. Normal mood. Data Reviewed:  See HPI  Assessment and Plan: Intractable nausea and vomiting Likely acute on chronic pancreatitis vs hyperemesis cannabinoid syndrome vs gastritis.  Patient reports abdominal and back pain similar to prior pancreatic episode although lipase is within normal limits without  any CT abdomen/pelvis finding.  He does use marijuana chronically. -Continuous IV fluids.  Start with full liquid diet and can advance as tolerated -Give IV PPI -PRN antiemetics -Patient reports pain only relieved by Dilaudid.  PRN low-dose Dilaudid for pain.  Hypertensive urgency Reports being nonadherent with his antihypertensives for least 3 weeks. -Resume home amlodipine, hydralazine, Coreg.  Holding HCTZ for now due to AKI -PRN IV labetalol 10 mg for SBP greater than 180  Type 2 diabetes mellitus without complication, with long-term current use of insulin (HCC) CBG of 233 on presentation.  Reports home regimen of 35 units of Lantus but has been nonadherent for 3 weeks. -Start with moderate SSI ACHS for now due to decreased oral intake with nausea and vomiting  Hyperlipidemia associated with type 2 diabetes mellitus (HCC) Continue home statin  Hyponatremia Mild at 131.  Continue to follow  with IV fluids.  AKI (acute kidney injury) (HCC) Creatinine of 1.64 up from 1.39.  Keep on continuous IV fluid. - Avoid nephrotoxic agent.  Holding HCTZ  Marijuana abuse UDS positive for THS      Advance Care Planning:   Code Status: Full Code   Consults: None  Family Communication: No family at bedside  Severity of Illness: The appropriate patient status for this patient is OBSERVATION. Observation status is judged to be reasonable and necessary in order to provide the required intensity of service to ensure the patient's safety. The patient's presenting symptoms, physical exam findings, and initial radiographic and laboratory data in the context of their medical condition is felt to place them at decreased risk for further clinical deterioration. Furthermore, it is anticipated that the patient will be medically stable for discharge from the hospital within 2 midnights of admission.   Author: Anselm Jungling, DO 01/04/2022 1:45 AM  For on call review [email or web address].

## 2022-01-04 NOTE — Assessment & Plan Note (Signed)
-   Continue home statin °

## 2022-01-04 NOTE — Assessment & Plan Note (Addendum)
Creatinine worsened today No hydro on imaging Continue to monitor on IVF Additional w/u as indicated UA with 0-5 RBC/s and 100 mg/dl protein

## 2022-01-04 NOTE — ED Notes (Signed)
Pt nauseated. Notified Powell MD that pt is requesting nausea meds.

## 2022-01-04 NOTE — Assessment & Plan Note (Addendum)
ddx is broad -> with diabetes consider diabetic gastroparesis, also cyclic vomiting/cannabinoid hyperemesis syndrome possibilities.  I have lower suspicion for pancreatitis/chronic pancreatitis (pancreas "unremarkable" on imaging and normal lipase).  Continue PO reglan at discharge Minimize opiates He'll need Aja Whitehair gastric emptying study and GI follow up outpatient

## 2022-01-04 NOTE — Assessment & Plan Note (Addendum)
Mild.  Continue to follow with IV fluids.

## 2022-01-04 NOTE — Assessment & Plan Note (Addendum)
Reports being nonadherent with his antihypertensives for least 3 weeks. -Resume home amlodipine, hydralazine, Coreg.   Holding HCTZ for now due to AKI -PRN IV labetalol 10 mg for SBP greater than 180

## 2022-01-04 NOTE — Progress Notes (Signed)
PROGRESS NOTE    Randy [name].  HGD:[id number] DOB: 1980/10/18 DOA: 01/03/2022 PCP: Rema Fendt, NP  Chief Complaint  Patient presents with   Chest Pain    Brief Narrative:  Randy Dutton. is Randy [name] 41 y.o. 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    Assessment & Plan:   Active Problems:   Intractable nausea and vomiting   Abdominal pain   Hypertensive urgency   Type 2 diabetes mellitus without complication, with long-term current use of insulin (HCC)   Hyperlipidemia associated with type 2 diabetes mellitus (HCC)   Marijuana abuse   AKI (acute kidney injury) (HCC)   Hyponatremia   Acute on chronic pancreatitis (HCC)   Assessment and Plan: Intractable nausea and vomiting ddx is broad -> with diabetes consider diabetic gastroparesis, also cyclic vomiting/cannabinoid hyperemesis syndrome possibilities.  I have lower suspicion for pancreatitis/chronic pancreatitis (pancreas "unremarkable" on imaging and normal lipase).  Continue IV fluids Full liquid diet, advance as tolerated IV PPI IV reglan  Minimize opiates, discussed need to try to minimize use of IV pain meds   He'll need Yasmene Salomone gastric emptying study and GI follow up outpatient  Abdominal pain Due to above, follow urine culture Low suspicion for pancreatitis   Type 2 diabetes mellitus without complication, with long-term current use of insulin (HCC) CBG of 233 on presentation.  Reports home regimen of 35 units of Lantus but has been nonadherent for 3 weeks. - levemir 9 units BID for now - SSI   Hypertensive urgency Reports being nonadherent with his antihypertensives for least 3 weeks. -Resume home amlodipine, hydralazine, Coreg.   Holding HCTZ for now due to AKI -PRN IV labetalol 10 mg for SBP greater  than 180  Hyperlipidemia associated with type 2 diabetes mellitus (HCC) Continue home statin  Hyponatremia Mild at 133.  Continue to follow with IV fluids.  AKI (acute kidney injury) (HCC) Creatinine of 1.64 up from 1.39.  Keep on continuous IV fluid. Avoid nephrotoxic agent.  Holding HCTZ  Marijuana abuse UDS positive for THS     DVT prophylaxis: lovenox Code Status: full Family Communication: none Disposition:   Status is: Observation The patient remains OBS appropriate and will d/c before 2 midnights.   Consultants:  none  Procedures:  none  Antimicrobials:  Anti-infectives (From admission, onward)    None       Subjective: Continued nausea, abdominal pain   Objective: Vitals:   01/04/22 0752 01/04/22 0828 01/04/22 0830 01/04/22 0928  BP: (!) 181/115  (!) 163/104 (!) 173/107  Pulse: 91 83 83   Resp: 13 18 (!) 22   Temp: 98.4 F (36.9 C)     TempSrc: Oral     SpO2: 100% 98% 98%   Weight:      Height:        Intake/Output Summary (Last 24 hours) at 01/04/2022 1047 Last data filed at 01/04/2022 0827 Gross per 24 hour  Intake 1707.13 ml  Output --  Net 1707.13 ml   Filed Weights   01/03/22 1532  Weight: 86.2 kg    Examination:  General exam: Appears calm and comfortable  Respiratory system: unlabored Cardiovascular system: RRR Gastrointestinal system: Abdomen is nondistended, soft and nontender.  Central nervous system: Alert and oriented. No focal neurological deficits. Extremities: no LEE    Data Reviewed: I have  personally reviewed following labs and imaging studies  CBC: Recent Labs  Lab 01/03/22 1306 01/04/22 0344  WBC 9.6 11.1*  HGB 12.7* 11.6*  HCT 39.0 35.5*  MCV 84.2 84.5  PLT 368 327    Basic Metabolic Panel: Recent Labs  Lab 01/03/22 1306 01/04/22 0344  NA 131* 133*  K 4.9 4.7  CL 100 102  CO2 22 23  GLUCOSE 233* 193*  BUN 15 16  CREATININE 1.64* 1.67*  CALCIUM 9.3 8.9    GFR: Estimated Creatinine  Clearance: 61.5 mL/min (Baylin Cabal) (by C-G formula based on SCr of 1.67 mg/dL (H)).  Liver Function Tests: Recent Labs  Lab 01/03/22 1306  AST 21  ALT 11  ALKPHOS 95  BILITOT 1.2  PROT 8.0  ALBUMIN 3.6    CBG: Recent Labs  Lab 01/04/22 0747  GLUCAP 218*     No results found for this or any previous visit (from the past 240 hour(s)).       Radiology Studies: CT ABDOMEN PELVIS W CONTRAST  Result Date: 01/03/2022 CLINICAL DATA:  Abdominal pain, acute, nonlocalized EXAM: CT ABDOMEN AND PELVIS WITH CONTRAST TECHNIQUE: Multidetector CT imaging of the abdomen and pelvis was performed using the standard protocol following bolus administration of intravenous contrast. RADIATION DOSE REDUCTION: This exam was performed according to the departmental dose-optimization program which includes automated exposure control, adjustment of the mA and/or kV according to patient size and/or use of iterative reconstruction technique. CONTRAST:  50mL OMNIPAQUE IOHEXOL 300 MG/ML  SOLN COMPARISON:  August 01, 2021 FINDINGS: Lower chest: No acute abnormality. Hepatobiliary: No focal liver abnormality is seen. No gallstones, gallbladder wall thickening, or biliary dilatation. Pancreas: Unremarkable. No pancreatic ductal dilatation or surrounding inflammatory changes. Spleen: Normal in size without focal abnormality. Adrenals/Urinary Tract: Adrenal glands are unremarkable. Kidneys are normal, without renal calculi, focal lesion, or hydronephrosis. Apparent mild thickening of the bladder wall which may be due to underdistention of the bladder and was present on the previous study as well. Stomach/Bowel: Stomach is within normal limits. Appendix appears normal. No evidence of bowel wall thickening, distention, or inflammatory changes. Vascular/Lymphatic: No significant vascular findings are present. No enlarged abdominal or pelvic lymph nodes. Reproductive: Prostate is unremarkable. Again seen are the calcifications of the  bilateral ductus deferens (spermatic duct) Other: No abdominal wall hernia or abnormality. No abdominopelvic ascites. Musculoskeletal: No acute or significant osseous findings. IMPRESSION: 1. No free air, fluid, inflammatory changes, mass or significant lymphadenopathy seen. Bowel-gas pattern is nonobstructive. 2. Apparent mild thickening of the urinary bladder wall. Clinical and laboratory correlation is suggested for possible cystitis. 3. Calcifications of the bilateral ductus deferens, stable and nonspecific. Electronically Signed   By: Marjo Bicker M.D.   On: 01/03/2022 17:25   DG Chest 1 View  Result Date: 01/03/2022 CLINICAL DATA:  Left chest and back pain. History of diabetes and pancreatitis. EXAM: CHEST  1 VIEW COMPARISON:  Radiographs 06/18/2021 and 04/08/2020.  CT 09/12/2020 FINDINGS: 1250 hours. The heart size and mediastinal contours are normal. The lungs are clear. There is no pleural effusion or pneumothorax. No acute osseous findings are identified. IMPRESSION: Stable chest.  No active cardiopulmonary process. Electronically Signed   By: Carey Bullocks M.D.   On: 01/03/2022 13:03        Scheduled Meds:  amLODipine  10 mg Oral Daily   atorvastatin  80 mg Oral Daily   carvedilol  25 mg Oral BID WC   enoxaparin (LOVENOX) injection  40 mg Subcutaneous Daily  hydrALAZINE  100 mg Oral Q8H   insulin aspart  0-15 Units Subcutaneous TID AC & HS   insulin detemir  9 Units Subcutaneous BID   metoCLOPramide (REGLAN) injection  5 mg Intravenous Q6H   ondansetron (ZOFRAN) IV  4 mg Intravenous Once   pantoprazole (PROTONIX) IV  40 mg Intravenous QHS   Continuous Infusions:  lactated ringers 100 mL/hr at 01/04/22 0827   promethazine (PHENERGAN) injection (IM or IVPB)       LOS: 0 days    Time spent: over 30 min    Lacretia Nicks, MD [facility]   To contact the attending provider between 7A-7P or the covering provider during after hours 7P-7A, please log into the web  site [email or web address] and access using universal [place] password for that web site. If you do not have the password, please call the hospital operator.  01/04/2022, 10:47 AM

## 2022-01-04 NOTE — Assessment & Plan Note (Signed)
Due to above, follow urine culture Low suspicion for pancreatitis

## 2022-01-04 NOTE — Assessment & Plan Note (Addendum)
CBG of 233 on presentation.  Reports home regimen of 35 units of Lantus but has been nonadherent for 3 weeks. - levemir 9 units BID for now - SSI

## 2022-01-04 NOTE — ED Notes (Signed)
Per microbiology, they will add the urine culture to the pt's UA that was previously collected

## 2022-01-04 NOTE — ED Notes (Signed)
Provider at bedside

## 2022-01-04 NOTE — Assessment & Plan Note (Signed)
UDS positive for THS

## 2022-01-05 LAB — GLUCOSE, CAPILLARY
Glucose-Capillary: 196 mg/dL — ABNORMAL HIGH (ref 70–99)
Glucose-Capillary: 126 mg/dL — ABNORMAL HIGH (ref 70–99)
Glucose-Capillary: 120 mg/dL — ABNORMAL HIGH (ref 70–99)
Glucose-Capillary: 192 mg/dL — ABNORMAL HIGH (ref 70–99)

## 2022-01-05 LAB — CBC WITH DIFFERENTIAL/PLATELET
Abs Immature Granulocytes: 0.03 10*3/uL (ref 0.00–0.07)
Basophils Absolute: 0.1 10*3/uL (ref 0.0–0.1)
Basophils Relative: 1 %
Eosinophils Absolute: 0.1 10*3/uL (ref 0.0–0.5)
Eosinophils Relative: 1 %
HCT: 31.9 % — ABNORMAL LOW (ref 39.0–52.0)
Hemoglobin: 10.7 g/dL — ABNORMAL LOW (ref 13.0–17.0)
Immature Granulocytes: 0 %
Lymphocytes Relative: 22 %
Lymphs Abs: 2.1 10*3/uL (ref 0.7–4.0)
MCH: 27.8 pg (ref 26.0–34.0)
MCHC: 33.5 g/dL (ref 30.0–36.0)
MCV: 82.9 fL (ref 80.0–100.0)
Monocytes Absolute: 0.9 10*3/uL (ref 0.1–1.0)
Monocytes Relative: 9 %
Neutro Abs: 6.3 10*3/uL (ref 1.7–7.7)
Neutrophils Relative %: 67 %
Platelets: 321 10*3/uL (ref 150–400)
RBC: 3.85 MIL/uL — ABNORMAL LOW (ref 4.22–5.81)
RDW: 12.5 % (ref 11.5–15.5)
WBC: 9.5 10*3/uL (ref 4.0–10.5)
nRBC: 0 % (ref 0.0–0.2)

## 2022-01-05 LAB — COMPREHENSIVE METABOLIC PANEL
ALT: 12 U/L (ref 0–44)
AST: 13 U/L — ABNORMAL LOW (ref 15–41)
Albumin: 3 g/dL — ABNORMAL LOW (ref 3.5–5.0)
Alkaline Phosphatase: 81 U/L (ref 38–126)
Anion gap: 8 (ref 5–15)
BUN: 19 mg/dL (ref 6–20)
CO2: 23 mmol/L (ref 22–32)
Calcium: 8.9 mg/dL (ref 8.9–10.3)
Chloride: 103 mmol/L (ref 98–111)
Creatinine, Ser: 1.95 mg/dL — ABNORMAL HIGH (ref 0.61–1.24)
GFR, Estimated: 43 mL/min — ABNORMAL LOW (ref >60)
Glucose, Bld: 214 mg/dL — ABNORMAL HIGH (ref 70–99)
Potassium: 3.8 mmol/L (ref 3.5–5.1)
Sodium: 134 mmol/L — ABNORMAL LOW (ref 135–145)
Total Bilirubin: 0.6 mg/dL (ref 0.3–1.2)
Total Protein: 6.9 g/dL (ref 6.5–8.1)

## 2022-01-05 LAB — URINE CULTURE: Culture: NO GROWTH

## 2022-01-05 LAB — MAGNESIUM: Magnesium: 1.8 mg/dL (ref 1.7–2.4)

## 2022-01-05 LAB — PHOSPHORUS: Phosphorus: 4 mg/dL (ref 2.5–4.6)

## 2022-01-05 MED ORDER — METOCLOPRAMIDE HCL 5 MG PO TABS
5.0000 mg | ORAL_TABLET | Freq: Three times a day (TID) | ORAL | Status: DC
  Administered 2022-01-05 – 2022-01-06 (×4): 5 mg via ORAL
  Filled 2022-01-05 (×4): qty 1

## 2022-01-05 MED ORDER — LACTATED RINGERS IV SOLN: INTRAVENOUS | Status: DC

## 2022-01-05 MED ORDER — HYDROMORPHONE HCL 1 MG/ML IJ SOLN
0.5000 mg | Freq: Four times a day (QID) | INTRAMUSCULAR | Status: AC | PRN
  Administered 2022-01-05 (×2): 0.5 mg via INTRAVENOUS
  Filled 2022-01-05: qty 1

## 2022-01-05 NOTE — Progress Notes (Signed)
Nutrition Consult Diet Education  Spoke with patient on the phone and reviewed diet guidelines for gastroparesis. We discussed having small, frequent meals and limiting fat and fiber intake to help reduce delayed gastric emptying. Patient receptive to diet education. Expect good compliance. Gastroparesis diet education handouts provided in discharge instructions. Currently on a carb modified diet; patient reports good intake of meals. No further nutrition interventions indicated at this time. Please re-consult if nutrition concerns arise.  Gabriel Rainwater RD, LDN, CNSC Please refer to Amion for contact information.

## 2022-01-05 NOTE — Discharge Instructions (Signed)
Gastroparesis Nutrition Therapy  Gastroparesis means that your stomach empties very slowly. This happens when the nerves to your stomach are damaged or do not work properly. This can cause bloating, stomach discomfort or pain, feeling full after eating only a small amount of food, nausea, or vomiting. If you have diabetes in addition to gastroparesis, it is important to control your blood glucose. This will help the stomach empty.  Tips Following these tips may help your stomach empty faster:  Eat small, frequent meals (4 to 6 times per day).  Do not eat solid foods that are high in fat and do not add too much fat to foods. See the Foods Not Recommended table for foods that are high fat.  High-fat solid foods may delay the emptying of your stomach.  Liquids that contain fat, such as milkshakes, may be tolerated and can provide needed calories. Do not eat foods high in fiber. Do not take fiber supplements or fiber bulking agents for constipation.  Do not eat foods that increase acid reflux:  Acidic, spicy, fried and greasy foods  Caffeine  Mint Do not drink alcohol or smoke  Do not drink carbonated beverages, as they increase bloating.  Chew foods well before swallowing. Solid foods in the stomach do not empty well. If you have difficulty tolerating solid foods, ground foods may be better.  If symptoms are severe, semi-solid foods or liquids may need to be your main food sources. Choose liquid nutritional supplements that have less than or equal to 2 grams fiber per serving.  Sit upright while eating and sit upright or walk after meals. Do not lie down for 3 to 4 hours after eating to avoid reflux or regurgitation.  If you wish to nap during the day, nap first and then eat.  Drinking fluids at meals can take up room in your stomach, and you might not get enough calories. At every meal, first eat a grain food and a protein food or dairy product if your body can tolerate it. Drink fluids with  calories. It may be better to delay fluids until after the meal and drink more between meals.   Foods Recommended Food Group Foods Recommended   Grains Choose grain foods with less than 2 grams of fiber per serving; these will be made with white flour Crackers: saltines or graham crackers Cold cereal: puffed rice Cream of rice or wheat Grits (fine ground) Gluten free low fiber foods Pretzels White bread, toasted White rice, cook until very soft  Protein Foods Lean meat and poultry: well-cooked, very tender, moist, and chopped fine Fish: tuna, salmon, or white fish Egg whites, scrambled Peanut butter (limit to 1 tablespoon at a time)  Dairy Milk*, drink 2% if tolerated to get more nutrients or lactose-free 2% milk Fortified non-dairy milks: almond, cashew, coconut, or rice (be aware that these options are not good sources of protein so you will need to eat an additional protein food) Fortified pea milk or soymilk (may cause gas and bloating for some) Instant breakfast* (pre-made lactose-free is sold in bottles) Milkshakes* (try blending in  to  cup canned fruit) Ice cream* (low-fat may be tolerated better; use in milkshakes to increase calories) Frozen yogurt Yogurt* Puddings and custard* Sherbet Liquid nutritional supplements with less than or equal to 2 grams fiber per 1 cup serving *Use lactose-free varieties to reduce gas and bloating  Vegetables Canned and well-cooked vegetables without seeds, skins or hulls Carrots, cooked Mashed potatoes (white, red or yellow) Sweet   potato  Fruit Canned, soft and well-cooked fruits without seeds, skins or membranes Applesauce Banana, mashed may be tolerated better Diced peaches/pears fruit cups in juice Melon, very soft, cut into small pieces Fruit nectar juices  Oils When possible choose oils rather than solid fats Canola or olive oil Margarine  Other Clear soup Gelatin Popsicles   Foods Not Recommended Food Group Foods Not  Recommended   Grains Bran Grains foods with 2 or more grams of fiber per serving: barley, brown rice, kasha, quinoa Popcorn Whole grain and high-fiber cereals, including oats or granola Whole grain bread or pasta  Protein Foods Fried meats, poultry or fish Sausage, bacon or hot dogs Seafood Tough meat, meat with gristle: steak, roast beef or pork chops Beans, peas or lentils Nuts  Dairy Cheese slices Liquid nutritional supplements that have more than 2 grams fiber per serving Pea milk, soymilk (may increase gas and bloating)  Vegetables Raw or undercooked vegetables Alfalfa, asparagus, bean sprouts, broccoli, brussels sprouts, cabbage, cauliflower, corn, green peas or any other kind of peas, lima beans, mushrooms, okra, onions, parsnips, peppers, pickles, potato skins, or spinach  Fruit Fresh fruit except for the ones in the foods recommended table Acidic fruit and juices: oranges/orange juice, grapefruit/grapefruit juice, tomatoes/tomato juice Avocado Berries Coconut Dried fruit Fruit skin Mandarin oranges Pineapple  Oils Fried foods of any type  Other Coffee Olives or pickles Pizza Salsa Sushi   Gastroparesis Sample 1-Day Menu  Breakfast 1 slice white toast (1 carbohydrate serving)  1 teaspoon margarine, soft, tub   cup egg substitute  1 cup peach nectar (2 carbohydrate servings)  Morning Snack Smoothie made with:  small banana (1 carbohydrate serving)  1/3 cup Greek strawberry yogurt ( carbohydrate serving)  1 cup 2% milk (1 carbohydrate serving)  Lunch 2 ounces canned chicken  1 teaspoon mayonnaise  9 saltine crackers (1 carbohydrate servings)   cup applesauce (1 carbohydrate serving)  Afternoon Snack 1 slice white toast (1 carbohydrate serving)  1 tablespoon smooth peanut butter  Evening Meal 2 ounces baked fish   cup mashed potatoes (1 carbohydrate serving)  1 teaspoon olive oil  1 cup 2% milk (1 carbohydrate serving)  Evening Snack 1 packet instant  breakfast (1 carbohydrate servings)  1 cup 2% milk (1 carbohydrate serving)   Gastroparesis Vegetarian (Lacto-Ovo) Sample 1-Day Menu  Breakfast  cup cooked farina (1 carbohydrate serving)   cup egg substitute  2 teaspoons olive oil   cup peach nectar (2 carbohydrate servings)   cup 2% milk ( carbohydrate serving)  Morning Snack 1 slice white toast (1 carbohydrate serving)  1 tablespoon smooth peanut butter  Lunch  cup vegetable soup (1 carbohydrate serving)  9 saltine crackers (1 carbohydrate serving)   cup applesauce (1 carbohydrate serving)   cup 2% milk ( carbohydrate serving)  Afternoon Snack 6 ounces plain yogurt (1 carbohydrate serving)   small banana (1 carbohydrate servings)  Evening Meal  cup baked tofu  2/3 cup white rice (2 carbohydrate servings)  2 teaspoons olive oil   cup 2% milk ( carbohydrate serving)  Evening Snack 1 packet instant breakfast (1 carbohydrate servings)  1 cup 2% milk (1 carbohydrate serving)   Gastroparesis Vegan Sample 1-Day Menu  Breakfast  cup cooked farina (1 carbohydrate serving)  1/3 cup tofu scramble  2 teaspoons olive oil   cup peach nectar (2 carbohydrate servings)   cup almond milk fortified with calcium, vitamin B12, and vitamin D  Morning Snack 1 slice white toast (1   carbohydrate serving)  1 tablespoon smooth peanut butter  Lunch  cup vegetable soup (1 carbohydrate serving)  9 saltine crackers (1 carbohydrate serving)   cup applesauce (1 carbohydrate serving)  Afternoon Snack 6 ounces plain soy yogurt (1 carbohydrate servings)   small banana (1 carbohydrate serving)  Evening Meal  cup baked tofu  2/3 cup white rice (2 carbohydrate servings)  2 teaspoons olive oil   cup almond milk fortified with calcium, vitamin B12, and vitamin D  Evening Snack  scoop soy protein powder ( carbohydrate serving)   cup almond milk fortified with calcium, vitamin B12, and vitamin D   Copyright 2020  [employer]  [employer]. All rights reserved.  

## 2022-01-05 NOTE — Progress Notes (Signed)
PROGRESS NOTE    Randy [name].  GGY:[id number] DOB: 07-15-1980 DOA: 01/03/2022 PCP: Rema Fendt, NP  Chief Complaint  Patient presents with   Chest Pain    Brief Narrative:  [name]. is Randy [name] 41 y.o. 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    Assessment & Plan:   Principal Problem:   Abdominal pain Active Problems:   Intractable nausea and vomiting   Hypertensive urgency   Type 2 diabetes mellitus without complication, with long-term current use of insulin (HCC)   Hyperlipidemia associated with type 2 diabetes mellitus (HCC)   Marijuana abuse   AKI (acute kidney injury) (HCC)   Hyponatremia   Acute on chronic pancreatitis (HCC)   Assessment and Plan: * Abdominal pain Due to above, follow urine culture - no growth Low suspicion for pancreatitis   Intractable nausea and vomiting ddx is broad -> with diabetes consider diabetic gastroparesis, also cyclic vomiting/cannabinoid hyperemesis syndrome possibilities.  I have lower suspicion for pancreatitis/chronic pancreatitis (pancreas "unremarkable" on imaging and normal lipase).  Continue IV fluids Advance diet today IV PPI IV reglan -> transition to PO Minimize opiates, discussed need to try to minimize use of IV pain meds (discontinue dilaudid tonight) He'll need Bryelle Spiewak gastric emptying study and GI follow up outpatient  Type 2 diabetes mellitus without complication, with long-term current use of insulin (HCC) CBG of 233 on presentation.  Reports home regimen of 35 units of Lantus but has been nonadherent for 3 weeks. - levemir 9 units BID for now - SSI   Hypertensive urgency Reports being nonadherent with his antihypertensives for least 3 weeks. -Resume home amlodipine, hydralazine, Coreg.   Holding HCTZ  for now due to AKI -PRN IV labetalol 10 mg for SBP greater than 180  Hyperlipidemia associated with type 2 diabetes mellitus (HCC) Continue home statin  Hyponatremia Mild.  Continue to follow with IV fluids.  AKI (acute kidney injury) (HCC) Creatinine worsened today No hydro on imaging Continue to monitor on IVF Additional w/u as indicated UA with 0-5 RBC/s and 100 mg/dl protein  Marijuana abuse UDS positive for THS     DVT prophylaxis: lovenox Code Status: full Family Communication: none Disposition:   Status is: Observation The patient remains OBS appropriate and will d/c before 2 midnights.   Consultants:  none  Procedures:  none  Antimicrobials:  Anti-infectives (From admission, onward)    None       Subjective: Continued nausea, abdominal pain, but overall some improvement  Objective: Vitals:   01/05/22 0052 01/05/22 0401 01/05/22 1240 01/05/22 1408  BP: (!) 149/88 (!) 152/92 (!) 155/90 113/68  Pulse: 92 100 92   Resp: 14 19 19    Temp: 98.9 F (37.2 C)  98.4 F (36.9 C)   TempSrc: Oral  Oral   SpO2: 98% 99% 99%   Weight:      Height:        Intake/Output Summary (Last 24 hours) at 01/05/2022 1917 Last data filed at 01/05/2022 1820 Gross per 24 hour  Intake 1824.91 ml  Output 1750 ml  Net 74.91 ml   Filed Weights   01/03/22 1532 01/04/22 2025  Weight: 86.2 kg 92.1 kg    Examination:  General: No acute distress. Cardiovascular: RRR Lungs: unlabored Abdomen: Soft, nontender, nondistended Neurological: Alert and oriented 3. Moves all extremities 4  with equal strength. Cranial nerves II through XII grossly intact. Extremities: No clubbing or cyanosis. No edema.  Data Reviewed: I have personally reviewed following labs and imaging studies  CBC: Recent Labs  Lab 01/03/22 1306 01/04/22 0344 01/05/22 0709  WBC 9.6 11.1* 9.5  NEUTROABS  --   --  6.3  HGB 12.7* 11.6* 10.7*  HCT 39.0 35.5* 31.9*  MCV 84.2 84.5 82.9  PLT 368 327  321    Basic Metabolic Panel: Recent Labs  Lab 01/03/22 1306 01/04/22 0344 01/05/22 0709  NA 131* 133* 134*  K 4.9 4.7 3.8  CL 100 102 103  CO2 22 23 23   GLUCOSE 233* 193* 214*  BUN 15 16 19   CREATININE 1.64* 1.67* 1.95*  CALCIUM 9.3 8.9 8.9  MG  --   --  1.8  PHOS  --   --  4.0    GFR: Estimated Creatinine Clearance: 54.4 mL/min (Cedrica Brune) (by C-G formula based on SCr of 1.95 mg/dL (H)).  Liver Function Tests: Recent Labs  Lab 01/03/22 1306 01/05/22 0709  AST 21 13*  ALT 11 12  ALKPHOS 95 81  BILITOT 1.2 0.6  PROT 8.0 6.9  ALBUMIN 3.6 3.0*    CBG: Recent Labs  Lab 01/04/22 1736 01/04/22 2142 01/05/22 0732 01/05/22 1148 01/05/22 1235  GLUCAP 130* 187* 196* 126* 120*     Recent Results (from the past 240 hour(s))  Urine Culture     Status: None   Collection Time: 01/04/22  9:07 AM   Specimen: Urine, Clean Catch  Result Value Ref Range Status   Specimen Description URINE, CLEAN CATCH  Final   Special Requests NONE  Final   Culture   Final    NO GROWTH Performed at [facility], [address], [place], [address] [place]    Report Status 01/05/2022 FINAL  Final         Radiology Studies: No results found.      Scheduled Meds:  amLODipine  10 mg Oral Daily   atorvastatin  80 mg Oral Daily   carvedilol  25 mg Oral BID WC   enoxaparin (LOVENOX) injection  40 mg Subcutaneous Daily   hydrALAZINE  100 mg Oral Q8H   insulin aspart  0-15 Units Subcutaneous TID AC & HS   insulin detemir  9 Units Subcutaneous BID   metoCLOPramide  5 mg Oral TID AC & HS   ondansetron (ZOFRAN) IV  4 mg Intravenous Once   pantoprazole (PROTONIX) IV  40 mg Intravenous QHS   Continuous Infusions:  lactated ringers 100 mL/hr at 01/05/22 1504   promethazine (PHENERGAN) injection (IM or IVPB)       LOS: 1 day    Time spent: over 30 min    01/07/2022, MD [facility]   To contact the attending provider between 7A-7P or the covering provider  during after hours 7P-7A, please log into the web site [email or web address] and access using universal [place] password for that web site. If you do not have the password, please call the hospital operator.  01/05/2022, 7:17 PM

## 2022-01-06 DIAGNOSIS — E669 Obesity, unspecified: Secondary | ICD-10-CM

## 2022-01-06 LAB — BASIC METABOLIC PANEL
Anion gap: 9 (ref 5–15)
BUN: 14 mg/dL (ref 6–20)
CO2: 21 mmol/L — ABNORMAL LOW (ref 22–32)
Calcium: 8.8 mg/dL — ABNORMAL LOW (ref 8.9–10.3)
Chloride: 102 mmol/L (ref 98–111)
Creatinine, Ser: 1.73 mg/dL — ABNORMAL HIGH (ref 0.61–1.24)
GFR, Estimated: 50 mL/min — ABNORMAL LOW (ref >60)
Glucose, Bld: 141 mg/dL — ABNORMAL HIGH (ref 70–99)
Potassium: 3.9 mmol/L (ref 3.5–5.1)
Sodium: 132 mmol/L — ABNORMAL LOW (ref 135–145)

## 2022-01-06 LAB — CBC
HCT: 34.4 % — ABNORMAL LOW (ref 39.0–52.0)
Hemoglobin: 11.1 g/dL — ABNORMAL LOW (ref 13.0–17.0)
MCH: 27.1 pg (ref 26.0–34.0)
MCHC: 32.3 g/dL (ref 30.0–36.0)
MCV: 83.9 fL (ref 80.0–100.0)
Platelets: 271 10*3/uL (ref 150–400)
RBC: 4.1 MIL/uL — ABNORMAL LOW (ref 4.22–5.81)
RDW: 12.3 % (ref 11.5–15.5)
WBC: 8.1 10*3/uL (ref 4.0–10.5)
nRBC: 0 % (ref 0.0–0.2)

## 2022-01-06 LAB — GLUCOSE, CAPILLARY
Glucose-Capillary: 132 mg/dL — ABNORMAL HIGH (ref 70–99)
Glucose-Capillary: 118 mg/dL — ABNORMAL HIGH (ref 70–99)

## 2022-01-06 MED ORDER — METOCLOPRAMIDE HCL 5 MG PO TABS
5.0000 mg | ORAL_TABLET | Freq: Three times a day (TID) | ORAL | 0 refills | Status: DC
  Filled 2022-01-06: qty 56, 14d supply, fill #0

## 2022-01-06 MED ORDER — CARVEDILOL 25 MG PO TABS
25.0000 mg | ORAL_TABLET | Freq: Two times a day (BID) | ORAL | 1 refills | Status: DC
  Filled 2022-01-06: qty 60, 30d supply, fill #0
  Filled 2022-01-30: qty 60, 30d supply, fill #1
  Filled 2022-04-03: qty 60, 30d supply, fill #2

## 2022-01-06 MED ORDER — AMLODIPINE BESYLATE 10 MG PO TABS: 10.0000 mg | ORAL_TABLET | Freq: Every day | ORAL | 1 refills | Status: DC

## 2022-01-06 MED ORDER — OMEPRAZOLE 40 MG PO CPDR: 40.0000 mg | DELAYED_RELEASE_CAPSULE | Freq: Every day | ORAL | 1 refills | Status: DC

## 2022-01-06 MED ORDER — BLOOD GLUCOSE MONITOR KIT: PACK | 0 refills | Status: DC

## 2022-01-06 MED ORDER — DAPAGLIFLOZIN PROPANEDIOL 5 MG PO TABS
5.0000 mg | ORAL_TABLET | Freq: Every day | ORAL | 1 refills | Status: AC
  Filled 2022-01-06 – 2022-05-14 (×2): qty 30, 30d supply, fill #0

## 2022-01-06 MED ORDER — BLOOD GLUCOSE MONITOR KIT
PACK | 0 refills | Status: DC
  Filled 2022-01-06: qty 1, 365d supply, fill #0

## 2022-01-06 MED ORDER — OXYCODONE HCL 5 MG PO TABS: 5.0000 mg | ORAL_TABLET | Freq: Four times a day (QID) | ORAL | 0 refills | Status: AC | PRN

## 2022-01-06 MED ORDER — ONDANSETRON 4 MG PO TBDP
4.0000 mg | ORAL_TABLET | Freq: Three times a day (TID) | ORAL | 0 refills | Status: AC | PRN
  Filled 2022-01-06: qty 15, 5d supply, fill #0

## 2022-01-06 MED ORDER — DAPAGLIFLOZIN PROPANEDIOL 5 MG PO TABS: 5.0000 mg | ORAL_TABLET | Freq: Every day | ORAL | 1 refills | Status: DC

## 2022-01-06 MED ORDER — OMEPRAZOLE 20 MG PO CPDR
40.0000 mg | DELAYED_RELEASE_CAPSULE | Freq: Every day | ORAL | 1 refills | Status: DC
  Filled 2022-01-06: qty 30, 30d supply, fill #0
  Filled 2022-01-07: qty 60, 30d supply, fill #0
  Filled 2022-01-30: qty 60, 30d supply, fill #1

## 2022-01-06 MED ORDER — HYDRALAZINE HCL 100 MG PO TABS
100.0000 mg | ORAL_TABLET | Freq: Three times a day (TID) | ORAL | 1 refills | Status: DC
  Filled 2022-01-06: qty 90, 30d supply, fill #0
  Filled 2022-01-30 – 2022-04-03 (×2): qty 90, 30d supply, fill #1

## 2022-01-06 MED ORDER — BASAGLAR KWIKPEN 100 UNIT/ML ~~LOC~~ SOPN: 18.0000 [IU] | PEN_INJECTOR | Freq: Every day | SUBCUTANEOUS | 1 refills | Status: DC

## 2022-01-06 MED ORDER — ATORVASTATIN CALCIUM 80 MG PO TABS: 80.0000 mg | ORAL_TABLET | Freq: Every day | ORAL | 1 refills | Status: DC

## 2022-01-06 MED ORDER — ATORVASTATIN CALCIUM 40 MG PO TABS
80.0000 mg | ORAL_TABLET | Freq: Every day | ORAL | 1 refills | Status: DC
  Filled 2022-01-06: qty 30, 30d supply, fill #0
  Filled 2022-01-07: qty 60, 30d supply, fill #0
  Filled 2022-01-30: qty 60, 30d supply, fill #1

## 2022-01-06 MED ORDER — AMLODIPINE BESYLATE 5 MG PO TABS
10.0000 mg | ORAL_TABLET | Freq: Every day | ORAL | 1 refills | Status: DC
  Filled 2022-01-06: qty 30, 30d supply, fill #0
  Filled 2022-01-07: qty 60, 30d supply, fill #0
  Filled 2022-01-30: qty 60, 30d supply, fill #1

## 2022-01-06 MED ORDER — METOCLOPRAMIDE HCL 5 MG PO TABS: 5.0000 mg | ORAL_TABLET | Freq: Three times a day (TID) | ORAL | 0 refills | Status: DC

## 2022-01-06 MED ORDER — ONDANSETRON 4 MG PO TBDP: 4.0000 mg | ORAL_TABLET | Freq: Three times a day (TID) | ORAL | 0 refills | Status: DC | PRN

## 2022-01-06 MED ORDER — HYDRALAZINE HCL 100 MG PO TABS: 100.0000 mg | ORAL_TABLET | Freq: Three times a day (TID) | ORAL | 1 refills | Status: DC

## 2022-01-06 MED ORDER — BASAGLAR KWIKPEN 100 UNIT/ML ~~LOC~~ SOPN
18.0000 [IU] | PEN_INJECTOR | Freq: Every day | SUBCUTANEOUS | 1 refills | Status: DC
  Filled 2022-01-06: qty 6, 33d supply, fill #0
  Filled 2022-01-30: qty 6, 33d supply, fill #1

## 2022-01-06 MED ORDER — CARVEDILOL 25 MG PO TABS: 25.0000 mg | ORAL_TABLET | Freq: Two times a day (BID) | ORAL | 1 refills | Status: DC

## 2022-01-06 NOTE — Progress Notes (Signed)
Patient given discharge instructions and stated understanding. 

## 2022-01-06 NOTE — Assessment & Plan Note (Signed)
Noted  

## 2022-01-06 NOTE — Discharge Summary (Signed)
Physician Discharge Summary  Randy [name]. WJX:[id number] DOB: [birth year] DOA: 01/03/2022  PCP: Randy Herter, NP  Admit date: 01/03/2022 Discharge date: 01/06/2022  Time spent: 40 minutes  Recommendations for Outpatient Follow-up:  Follow outpatient CBC/CMP  Suspect diabetic gastroparesis, cyclic vomiting, cannabinoid hyperemesis as underlying cause?  Needs gastric emptying study outpatient Follow with gi outpatient Adjust diabetes meds as needed outpatient  Follow blood pressure and adjust as able outpatient (thiazide d/c'd with aki at presentatio) Follow kidney function outpatient  Discharge Diagnoses:  Principal Problem:   Abdominal pain Active Problems:   Intractable nausea and vomiting   Hypertensive urgency   Type 2 diabetes mellitus without complication, with long-term current use of insulin (HCC)   Hyperlipidemia associated with type 2 diabetes mellitus (HCC)   Obesity (BMI 30-39.9)   Marijuana abuse   AKI (acute kidney injury) ([place])   Hyponatremia   Acute on chronic pancreatitis [facility])   Discharge Condition: stable  Diet recommendation: heart healthy, diabetic  Filed Weights   01/03/22 1532 01/04/22 2025  Weight: 86.2 kg 92.1 kg    History of present illness:  Randy [name]. is Randy [name] 41 y.o. 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  Suspect related to diabetic gastroparesis?  Discharged on reglan, follow outpatient.  See below for additional details  Hospital Course:  Assessment and Plan: * Abdominal pain Due to above, follow urine culture - no growth Low suspicion for pancreatitis   Intractable nausea and vomiting ddx is broad -> with diabetes consider diabetic gastroparesis, also cyclic vomiting/cannabinoid hyperemesis syndrome possibilities.   I have lower suspicion for pancreatitis/chronic pancreatitis (pancreas "unremarkable" on imaging and normal lipase).  Continue PO reglan at discharge Minimize opiates He'll need Randy [name] gastric emptying study and GI follow up outpatient  Type 2 diabetes mellitus without complication, with long-term current use of insulin (HCC) CBG of 233 on presentation.  Reports home regimen of 35 units of Lantus but has been nonadherent for 3 weeks. - levemir 9 units BID for now -> discharge on 18 units long acting  Hypertensive urgency Reports being nonadherent with his antihypertensives for least 3 weeks. -Resume home amlodipine, hydralazine, Coreg.   Holding HCTZ for now due to AKI  Hyperlipidemia associated with type 2 diabetes mellitus ([place]) Continue home statin  Obesity (BMI 30-39.9) Noted   Hyponatremia Mild.  Continue to follow with IV fluids.  AKI (acute kidney injury) ([place]) Creatinine worsened today No hydro on imaging Continue to monitor on IVF Additional w/u as indicated UA with 0-5 RBC/s and 100 mg/dl protein  Marijuana abuse UDS positive for THS     Procedures: none   Consultations: none  Discharge Exam: Vitals:   01/06/22 0515 01/06/22 0615  BP: (!) 178/88 139/64  Pulse: 83 89  Resp:    Temp:    SpO2: 100% 96%   Ok with discharge plan for today  General: No acute distress. Cardiovascular: RRR Lungs: unlabored Abdomen: s/nt/nd Neurological: Alert and oriented 3. Moves all extremities 4 with equal strength. Cranial nerves II through XII grossly intact. Extremities: No clubbing or cyanosis. No edema.  Discharge Instructions   Discharge Instructions     Ambulatory referral to Gastroenterology   Complete by: As directed    What is the reason for referral?: Other Comment - suspected gastroparesis   Call MD for:  difficulty breathing,  headache or visual disturbances   Complete by: As directed    Call MD for:  extreme fatigue   Complete by: As directed     Call MD for:  hives   Complete by: As directed    Call MD for:  persistant dizziness or light-headedness   Complete by: As directed    Call MD for:  persistant nausea and vomiting   Complete by: As directed    Call MD for:  redness, tenderness, or signs of infection (pain, swelling, redness, odor or green/yellow discharge around incision site)   Complete by: As directed    Call MD for:  severe uncontrolled pain   Complete by: As directed    Call MD for:  temperature >100.4   Complete by: As directed    Diet - low sodium heart healthy   Complete by: As directed    Discharge instructions   Complete by: As directed    You were seen for abdominal pain, nausea, and vomiting.  There was no evidence of pancreatitis on your labs or imaging.  I suspect your recurrent nausea and vomiting maybe related to diabetic gastroparesis or cyclic vomiting or related to marijuana use.  Stop further marijuana use.   Eat smaller, more frequent meals.  This is helpful with diabetic gastroparesis.  I'll prescribe reglan for you, this helps with gut motility.  Follow up with your PCP to discuss long term plans/options.  You'll need Randy [name] gastric emptying study outpatient.  We've refilled your medicines.  I adjusted your insulin to [name] lower dose based on your blood sugars.  Follow with your PCP for changes.  I stopped your glipizide.  Return for new, recurrent, or worsening symptoms.  Please ask your PCP to request records from this hospitalization so they know what was done and what the next steps will be.   Increase activity slowly   Complete by: As directed       Allergies as of 01/06/2022       Reactions   Tomato Other (See Comments)   Unknown reaction        Medication List     STOP taking these medications    glipiZIDE 10 MG tablet Commonly known as: GLUCOTROL   hydrochlorothiazide 12.5 MG tablet Commonly known as: HYDRODIURIL   sildenafil 100 MG tablet Commonly known as: VIAGRA        TAKE these medications    Accu-Chek Guide test strip Generic drug: glucose blood Use to check blood sugar 3 times daily.   amLODipine 10 MG tablet Commonly known as: NORVASC Take 1 tablet (10 mg total) by mouth daily.   atorvastatin 80 MG tablet Commonly known as: LIPITOR Take 1 tablet (80 mg total) by mouth daily.   Basaglar KwikPen 100 UNIT/ML Inject 18 Units into the skin daily. What changed: how much to take   blood glucose meter kit and supplies Kit Dispense based on patient and insurance preference. Use up to four times daily as directed.   carvedilol 25 MG tablet Commonly known as: COREG Take 1 tablet (25 mg total) by mouth 2 (two) times daily with Randy [name] meal.   dapagliflozin propanediol 5 MG Tabs tablet Commonly known as: FARXIGA Take 1 tablet (5 mg total) by mouth daily before breakfast.   hydrALAZINE 100 MG tablet Commonly known as: APRESOLINE Take 1 tablet (100 mg total) by mouth every 8 (eight) hours.   metoCLOPramide 5 MG tablet Commonly known as: REGLAN Take 1 tablet (5 mg total) by  mouth 4 (four) times daily -  before meals and at bedtime for 14 days. Please discuss with your PCP whether this needs to be continued   omeprazole 40 MG capsule Commonly known as: PRILOSEC Take 1 capsule (40 mg total) by mouth daily.   ondansetron 4 MG disintegrating tablet Commonly known as: ZOFRAN-ODT Take 1 tablet (4 mg total) by mouth every 8 (eight) hours as needed for up to 5 days for nausea or vomiting.   oxyCODONE 5 MG immediate release tablet Commonly known as: Oxy IR/ROXICODONE Take 1 tablet (5 mg total) by mouth every 6 (six) hours as needed for up to 2 days for severe pain.   TRUEplus 5-Bevel Pen Needles 32G X 4 MM Misc Generic drug: Insulin Pen Needle Use to inject basaglar once daily.       Allergies  Allergen Reactions   Tomato Other (See Comments)    Unknown reaction    Follow-up Information     Randy Herter, NP. Schedule an appointment as  soon as possible for [place] visit in 1 week(s).   Specialty: Nurse Practitioner Why: Please call and make an appiontment to follow up with primary care doctor Contact information: [address] [place] [address] [phone]                  The results of significant diagnostics from this hospitalization (including imaging, microbiology, ancillary and laboratory) are listed below for reference.    Significant Diagnostic Studies: CT ABDOMEN PELVIS W CONTRAST  Result Date: 01/03/2022 CLINICAL DATA:  Abdominal pain, acute, nonlocalized EXAM: CT ABDOMEN AND PELVIS WITH CONTRAST TECHNIQUE: Multidetector CT imaging of the abdomen and pelvis was performed using the standard protocol following bolus administration of intravenous contrast. RADIATION DOSE REDUCTION: This exam was performed according to the departmental dose-optimization program which includes automated exposure control, adjustment of the mA and/or kV according to patient size and/or use of iterative reconstruction technique. CONTRAST:  35m OMNIPAQUE IOHEXOL 300 MG/ML  SOLN COMPARISON:  August 01, 2021 FINDINGS: Lower chest: No acute abnormality. Hepatobiliary: No focal liver abnormality is seen. No gallstones, gallbladder wall thickening, or biliary dilatation. Pancreas: Unremarkable. No pancreatic ductal dilatation or surrounding inflammatory changes. Spleen: Normal in size without focal abnormality. Adrenals/Urinary Tract: Adrenal glands are unremarkable. Kidneys are normal, without renal calculi, focal lesion, or hydronephrosis. Apparent mild thickening of the bladder wall which may be due to underdistention of the bladder and was present on the previous study as well. Stomach/Bowel: Stomach is within normal limits. Appendix appears normal. No evidence of bowel wall thickening, distention, or inflammatory changes. Vascular/Lymphatic: No significant vascular findings are present. No enlarged abdominal or pelvic lymph  nodes. Reproductive: Prostate is unremarkable. Again seen are the calcifications of the bilateral ductus deferens (spermatic duct) Other: No abdominal wall hernia or abnormality. No abdominopelvic ascites. Musculoskeletal: No acute or significant osseous findings. IMPRESSION: 1. No free air, fluid, inflammatory changes, mass or significant lymphadenopathy seen. Bowel-gas pattern is nonobstructive. 2. Apparent mild thickening of the urinary bladder wall. Clinical and laboratory correlation is suggested for possible cystitis. 3. Calcifications of the bilateral ductus deferens, stable and nonspecific. Electronically Signed   By: AFrazier RichardsM.D.   On: 01/03/2022 17:25   DG Chest 1 View  Result Date: 01/03/2022 CLINICAL DATA:  Left chest and back pain. History of diabetes and pancreatitis. EXAM: CHEST  1 VIEW COMPARISON:  Radiographs 06/18/2021 and 04/08/2020.  CT 09/12/2020 FINDINGS: 1250 hours. The heart size and mediastinal contours are  normal. The lungs are clear. There is no pleural effusion or pneumothorax. No acute osseous findings are identified. IMPRESSION: Stable chest.  No active cardiopulmonary process. Electronically Signed   By: Richardean Sale M.D.   On: 01/03/2022 13:03    Microbiology: Recent Results (from the past 240 hour(s))  Urine Culture     Status: None   Collection Time: 01/04/22  9:07 AM   Specimen: Urine, Clean Catch  Result Value Ref Range Status   Specimen Description URINE, CLEAN CATCH  Final   Special Requests NONE  Final   Culture   Final    NO GROWTH Performed at [facility], [address], [place],  [address]    Report Status 01/05/2022 FINAL  Final     Labs: Basic Metabolic Panel: Recent Labs  Lab 01/03/22 1306 01/04/22 0344 01/05/22 0709 01/06/22 0950  NA 131* 133* 134* 132*  K 4.9 4.7 3.8 3.9  CL 100 102 103 102  CO2 _0 21*  GLUCOSE 233* 193* 214* 141*  BUN _1 CREATININE 1.64* 1.67* 1.95* 1.73*  CALCIUM 9.3 8.9 8.9  8.8*  MG  --   --  1.8  --   PHOS  --   --  4.0  --    Liver Function Tests: Recent Labs  Lab 01/03/22 1306 01/05/22 0709  AST 21 13*  ALT 11 12  ALKPHOS 95 81  BILITOT 1.2 0.6  PROT 8.0 6.9  ALBUMIN 3.6 3.0*   Recent Labs  Lab 01/03/22 1306  LIPASE 51   No results for input(s): "AMMONIA" in the last 168 hours. CBC: Recent Labs  Lab 01/03/22 1306 01/04/22 0344 01/05/22 0709 01/06/22 0950  WBC 9.6 11.1* 9.5 8.1  NEUTROABS  --   --  6.3  --   HGB 12.7* 11.6* 10.7* 11.1*  HCT 39.0 35.5* 31.9* 34.4*  MCV 84.2 84.5 82.9 83.9  PLT 368 327 321 271   Cardiac Enzymes: No results for input(s): "CKTOTAL", "CKMB", "CKMBINDEX", "TROPONINI" in the last 168 hours. BNP: BNP (last 3 results) Recent Labs    06/18/21 0606  BNP 148.9*    ProBNP (last 3 results) No results for input(s): "PROBNP" in the last 8760 hours.  CBG: Recent Labs  Lab 01/05/22 1148 01/05/22 1235 01/05/22 2045 01/06/22 0739 01/06/22 1110  GLUCAP 126* 120* 192* 132* 118*       Signed:  Fayrene Helper MD.  [facility] 01/06/2022, 1:33 PM

## 2022-01-06 NOTE — TOC Transition Note (Signed)
Transition of Care (TOC) - CM/SW Discharge Note Donn Pierini RN, BSN Transitions of Care Unit 4E- RN Case Manager See Treatment Team for direct phone #  Weekend cross coverage  Patient Details  Name: Randy [name]. MRN: [id number] Date of Birth: 09/28/1980  Transition of Care [facility]) CM/SW Contact:  Darrold Span, RN Phone Number: 01/06/2022, 1:32 PM   Clinical Narrative:    Pt stable for transition home today, Pt has primary care established with [facility]- PCP- [name] NP. Will need to call and make f/u appointment tomorrow.  Pt gets his meds from [facility]- was assisted with meds using MATCH in 06/2021- is not eligible for MATCH again at this time. Will need to go to [facility] tomorrow to pick up meds.   No further TOC needs noted.    Final next level of care: Home/Self Care Barriers to Discharge: No Barriers Identified   Patient Goals and CMS Choice     Choice offered to / list presented to : NA  Discharge Placement               Home        Discharge Plan and Services     Post Acute Care Choice: NA                               Social Determinants of Health (SDOH) Interventions     Readmission Risk Interventions    01/06/2022    1:32 PM 06/20/2021   11:50 AM  Readmission Risk Prevention Plan  Transportation Screening Complete Complete  PCP or Specialist Appt within 3-5 Days Not Complete Complete  Not Complete comments pt will need to call for appointment   HRI or Home Care Consult Complete Complete  Social Work Consult for Recovery Care Planning/Counseling Complete Complete  Palliative Care Screening Not Applicable Not Applicable  Medication Review [facility]) Complete Referral to Pharmacy

## 2022-01-06 NOTE — Plan of Care (Signed)

## 2022-01-07 ENCOUNTER — Other Ambulatory Visit: Payer: Self-pay

## 2022-01-07 ENCOUNTER — Telehealth: Payer: Self-pay

## 2022-01-07 MED ORDER — GLUCOSE BLOOD VI STRP
ORAL_STRIP | 0 refills | Status: DC
  Filled 2022-01-07: qty 100, 25d supply, fill #0

## 2022-01-07 MED ORDER — TRUEPLUS LANCETS 28G MISC
0 refills | Status: DC
  Filled 2022-01-07: qty 100, 25d supply, fill #0

## 2022-01-07 NOTE — Telephone Encounter (Signed)
Transition Care Management Unsuccessful Follow-up Telephone Call  Date of discharge and from where:  01/06/2022, [facility]  Attempts:  1st Attempt  Reason for unsuccessful TCM follow-up call:  Left voice message on # [phone], call back requested.

## 2022-01-07 NOTE — Telephone Encounter (Signed)
Transition Care Management Follow-up Telephone Call  Patient returned my call.  Date of discharge and from where: 01/06/2022, [facility] How have you been since you were released from the hospital? He said he is doing okay.  Any questions or concerns? Yes- he explained that he is scheduled for cataract surgery tomorrow at [facility]. He then said that he doesn't have any of his medications.  He only has 1 amlodipine tablet left.  He explained that he is not working and does not have the money ($50) for meds. I told him that I would [facility] to inquire if he is eligible for one time free fill.  I contacted Mardee Postin, Pharmacy Tech who confirmed patient is eligible for a free fill or he can pay $40 for medications through [facility].  I explained the options to the patient and he opted for the free fill. I explained to him that this is a one time free fill and he will need to pay for the medications going forward. I urged him to speak to the pharmacist when he picks up the meds to inquire about patient assistance programs going forward.  He said he plans to pick up the medications today and I left Tresa Endo G.know that he is coming today for the medications.    Items Reviewed: Did the pt receive and understand the discharge instructions provided? Yes  Medications obtained and verified? No - this was addressed as noted above.  Other? No  Any new allergies since your discharge? No  Dietary orders reviewed? No Do you have support at home? Yes - he said his son helps him.  Home Care and Equipment/Supplies: Were home health services ordered? no If so, what is the name of the agency? N/a  Has the agency set up a time to come to the patient's home? not applicable Were any new equipment or medical supplies ordered?  No What is the name of the medical supply agency? N/a Were you able to get the supplies/equipment? not applicable Do you have any questions related to  the use of the equipment or supplies? No  Functional Questionnaire: (I = Independent and D = Dependent) ADLs: independent with personal care, medication management. He has a cane to use with ambulation as needed   Follow up appointments reviewed:  PCP Hospital f/u appt confirmed? Yes  Scheduled to see [name], NP - 01/22/2022.  Specialist Hospital f/u appt confirmed?  None scheduled yet    Are transportation arrangements needed? No  If their condition worsens, is the pt aware to call PCP or go to the Emergency Dept.? Yes Was the patient provided with contact information for the PCP's office or ED? Yes Was to pt encouraged to call back with questions or concerns? Yes

## 2022-01-13 NOTE — Progress Notes (Signed)
Erroneous encounter-disregard

## 2022-01-22 ENCOUNTER — Encounter: Payer: Self-pay | Admitting: Family

## 2022-01-22 DIAGNOSIS — E1169 Type 2 diabetes mellitus with other specified complication: Secondary | ICD-10-CM

## 2022-01-22 DIAGNOSIS — R112 Nausea with vomiting, unspecified: Secondary | ICD-10-CM

## 2022-01-22 DIAGNOSIS — E669 Obesity, unspecified: Secondary | ICD-10-CM

## 2022-01-22 DIAGNOSIS — I160 Hypertensive urgency: Secondary | ICD-10-CM

## 2022-01-22 DIAGNOSIS — Z09 Encounter for follow-up examination after completed treatment for conditions other than malignant neoplasm: Secondary | ICD-10-CM

## 2022-01-22 DIAGNOSIS — N179 Acute kidney failure, unspecified: Secondary | ICD-10-CM

## 2022-01-22 DIAGNOSIS — E119 Type 2 diabetes mellitus without complications: Secondary | ICD-10-CM

## 2022-01-22 DIAGNOSIS — I10 Essential (primary) hypertension: Secondary | ICD-10-CM

## 2022-01-22 DIAGNOSIS — K3184 Gastroparesis: Secondary | ICD-10-CM

## 2022-01-22 DIAGNOSIS — E871 Hypo-osmolality and hyponatremia: Secondary | ICD-10-CM

## 2022-01-22 DIAGNOSIS — K859 Acute pancreatitis without necrosis or infection, unspecified: Secondary | ICD-10-CM

## 2022-01-22 DIAGNOSIS — R109 Unspecified abdominal pain: Secondary | ICD-10-CM

## 2022-01-22 DIAGNOSIS — F1210 Cannabis abuse, uncomplicated: Secondary | ICD-10-CM

## 2022-01-30 ENCOUNTER — Other Ambulatory Visit: Payer: Self-pay

## 2022-01-30 ENCOUNTER — Other Ambulatory Visit: Payer: Self-pay | Admitting: Family Medicine

## 2022-01-30 DIAGNOSIS — E1165 Type 2 diabetes mellitus with hyperglycemia: Secondary | ICD-10-CM

## 2022-01-30 MED ORDER — TRUEPLUS 5-BEVEL PEN NEEDLES 32G X 4 MM MISC
2 refills | Status: DC
  Filled 2022-01-30: qty 100, 100d supply, fill #0

## 2022-01-30 NOTE — Telephone Encounter (Signed)
Order complete. 

## 2022-01-31 ENCOUNTER — Other Ambulatory Visit: Payer: Self-pay

## 2022-02-04 ENCOUNTER — Other Ambulatory Visit: Payer: Self-pay

## 2022-02-06 ENCOUNTER — Other Ambulatory Visit: Payer: Self-pay

## 2022-03-26 ENCOUNTER — Emergency Department (HOSPITAL_COMMUNITY): Payer: Self-pay

## 2022-03-26 ENCOUNTER — Encounter (HOSPITAL_COMMUNITY): Payer: Self-pay | Admitting: Emergency Medicine

## 2022-03-26 ENCOUNTER — Observation Stay (HOSPITAL_COMMUNITY)
Admission: EM | Admit: 2022-03-26 | Discharge: 2022-03-28 | Disposition: A | Discharge status: Home / Self Care | Payer: Self-pay | Attending: Family Medicine | Admitting: Family Medicine

## 2022-03-26 ENCOUNTER — Other Ambulatory Visit: Payer: Self-pay

## 2022-03-26 DIAGNOSIS — E871 Hypo-osmolality and hyponatremia: Secondary | ICD-10-CM | POA: Diagnosis present

## 2022-03-26 DIAGNOSIS — R748 Abnormal levels of other serum enzymes: Secondary | ICD-10-CM | POA: Insufficient documentation

## 2022-03-26 DIAGNOSIS — E1165 Type 2 diabetes mellitus with hyperglycemia: Secondary | ICD-10-CM

## 2022-03-26 DIAGNOSIS — K219 Gastro-esophageal reflux disease without esophagitis: Secondary | ICD-10-CM | POA: Diagnosis present

## 2022-03-26 DIAGNOSIS — N1831 Chronic kidney disease, stage 3a: Secondary | ICD-10-CM | POA: Diagnosis present

## 2022-03-26 DIAGNOSIS — F1219 Cannabis abuse with unspecified cannabis-induced disorder: Secondary | ICD-10-CM | POA: Insufficient documentation

## 2022-03-26 DIAGNOSIS — F1210 Cannabis abuse, uncomplicated: Secondary | ICD-10-CM | POA: Diagnosis present

## 2022-03-26 DIAGNOSIS — I10 Essential (primary) hypertension: Secondary | ICD-10-CM | POA: Diagnosis present

## 2022-03-26 DIAGNOSIS — D649 Anemia, unspecified: Secondary | ICD-10-CM | POA: Diagnosis present

## 2022-03-26 DIAGNOSIS — D631 Anemia in chronic kidney disease: Secondary | ICD-10-CM | POA: Insufficient documentation

## 2022-03-26 DIAGNOSIS — F1290 Cannabis use, unspecified, uncomplicated: Secondary | ICD-10-CM | POA: Diagnosis present

## 2022-03-26 DIAGNOSIS — Z79899 Other long term (current) drug therapy: Secondary | ICD-10-CM | POA: Insufficient documentation

## 2022-03-26 DIAGNOSIS — E860 Dehydration: Secondary | ICD-10-CM | POA: Insufficient documentation

## 2022-03-26 DIAGNOSIS — R112 Nausea with vomiting, unspecified: Principal | ICD-10-CM | POA: Diagnosis present

## 2022-03-26 DIAGNOSIS — Z87891 Personal history of nicotine dependence: Secondary | ICD-10-CM | POA: Insufficient documentation

## 2022-03-26 DIAGNOSIS — E785 Hyperlipidemia, unspecified: Secondary | ICD-10-CM | POA: Diagnosis present

## 2022-03-26 DIAGNOSIS — I129 Hypertensive chronic kidney disease with stage 1 through stage 4 chronic kidney disease, or unspecified chronic kidney disease: Secondary | ICD-10-CM | POA: Insufficient documentation

## 2022-03-26 DIAGNOSIS — E1122 Type 2 diabetes mellitus with diabetic chronic kidney disease: Secondary | ICD-10-CM | POA: Insufficient documentation

## 2022-03-26 DIAGNOSIS — Z794 Long term (current) use of insulin: Secondary | ICD-10-CM

## 2022-03-26 DIAGNOSIS — R1013 Epigastric pain: Secondary | ICD-10-CM | POA: Insufficient documentation

## 2022-03-26 DIAGNOSIS — E1169 Type 2 diabetes mellitus with other specified complication: Secondary | ICD-10-CM | POA: Diagnosis present

## 2022-03-26 DIAGNOSIS — I160 Hypertensive urgency: Secondary | ICD-10-CM | POA: Diagnosis present

## 2022-03-26 LAB — CBC WITH DIFFERENTIAL/PLATELET
Abs Immature Granulocytes: 0.03 10*3/uL (ref 0.00–0.07)
Basophils Absolute: 0.1 10*3/uL (ref 0.0–0.1)
Basophils Relative: 1 %
Eosinophils Absolute: 0.2 10*3/uL (ref 0.0–0.5)
Eosinophils Relative: 2 %
HCT: 30.2 % — ABNORMAL LOW (ref 39.0–52.0)
Hemoglobin: 9.8 g/dL — ABNORMAL LOW (ref 13.0–17.0)
Immature Granulocytes: 0 %
Lymphocytes Relative: 25 %
Lymphs Abs: 2.3 10*3/uL (ref 0.7–4.0)
MCH: 27.5 pg (ref 26.0–34.0)
MCHC: 32.5 g/dL (ref 30.0–36.0)
MCV: 84.8 fL (ref 80.0–100.0)
Monocytes Absolute: 0.6 10*3/uL (ref 0.1–1.0)
Monocytes Relative: 7 %
Neutro Abs: 5.9 10*3/uL (ref 1.7–7.7)
Neutrophils Relative %: 65 %
Platelets: 292 10*3/uL (ref 150–400)
RBC: 3.56 MIL/uL — ABNORMAL LOW (ref 4.22–5.81)
RDW: 13.2 % (ref 11.5–15.5)
WBC: 9.1 10*3/uL (ref 4.0–10.5)
nRBC: 0 % (ref 0.0–0.2)

## 2022-03-26 LAB — COMPREHENSIVE METABOLIC PANEL
ALT: 19 U/L (ref 0–44)
AST: 25 U/L (ref 15–41)
Albumin: 3.4 g/dL — ABNORMAL LOW (ref 3.5–5.0)
Alkaline Phosphatase: 111 U/L (ref 38–126)
Anion gap: 5 (ref 5–15)
BUN: 30 mg/dL — ABNORMAL HIGH (ref 6–20)
CO2: 26 mmol/L (ref 22–32)
Calcium: 8.4 mg/dL — ABNORMAL LOW (ref 8.9–10.3)
Chloride: 103 mmol/L (ref 98–111)
Creatinine, Ser: 1.52 mg/dL — ABNORMAL HIGH (ref 0.61–1.24)
GFR, Estimated: 58 mL/min — ABNORMAL LOW (ref >60)
Glucose, Bld: 120 mg/dL — ABNORMAL HIGH (ref 70–99)
Potassium: 3.9 mmol/L (ref 3.5–5.1)
Sodium: 134 mmol/L — ABNORMAL LOW (ref 135–145)
Total Bilirubin: 0.6 mg/dL (ref 0.3–1.2)
Total Protein: 7.6 g/dL (ref 6.5–8.1)

## 2022-03-26 LAB — LIPASE, BLOOD: Lipase: 133 U/L — ABNORMAL HIGH (ref 11–51)

## 2022-03-26 MED ORDER — IOHEXOL 300 MG/ML  SOLN
85.0000 mL | Freq: Once | INTRAMUSCULAR | Status: AC | PRN
  Administered 2022-03-27: 85 mL via INTRAVENOUS

## 2022-03-26 MED ORDER — HYDROMORPHONE HCL 1 MG/ML IJ SOLN
1.0000 mg | Freq: Once | INTRAMUSCULAR | Status: AC
  Administered 2022-03-27: 1 mg via INTRAVENOUS
  Filled 2022-03-26: qty 1

## 2022-03-26 MED ORDER — ONDANSETRON HCL 4 MG/2ML IJ SOLN
4.0000 mg | Freq: Once | INTRAMUSCULAR | Status: AC
  Administered 2022-03-27: 4 mg via INTRAVENOUS
  Filled 2022-03-26: qty 2

## 2022-03-26 MED ORDER — SODIUM CHLORIDE 0.9 % IV BOLUS
1000.0000 mL | Freq: Once | INTRAVENOUS | Status: AC
  Administered 2022-03-27: 1000 mL via INTRAVENOUS

## 2022-03-26 NOTE — ED Provider Notes (Signed)
[facility] DEPT Provider Note   CSN: [id number] Arrival date & time: 03/26/22  2203     History {Add pertinent medical, surgical, social history, OB history to HPI:1} Chief Complaint  Patient presents with   Abdominal Pain    Randy [name]. is a 41 y.o. male.  The history is provided by the patient and medical records.  Abdominal Pain Randy [name]. is a 41 y.o. male who presents to the Emergency Department complaining of *** 3d v ap, back pain     Home Medications Prior to Admission medications   Medication Sig Start Date End Date Taking? Authorizing Provider  amLODipine (NORVASC) 5 MG tablet Take 2 tablets (10 mg total) by mouth daily. 01/06/22 03/08/22  Elodia Florence., MD  atorvastatin (LIPITOR) 40 MG tablet Take 2 tablets (80 mg total) by mouth daily. 01/06/22 05/06/22  Elodia Florence., MD  blood glucose meter kit and supplies KIT Dispense based on patient and insurance preference. Use up to four times daily as directed. 01/06/22   Elodia Florence., MD  carvedilol (COREG) 25 MG tablet Take 1 tablet (25 mg total) by mouth 2 (two) times daily with a meal. 01/06/22 05/06/22  Elodia Florence., MD  glucose blood (ACCU-CHEK GUIDE) test strip Use to check blood sugar 3 times daily. 10/15/21   Charlott Rakes, MD  glucose blood test strip USE UP TO FOUR TIMES DAILY AS DIRECTED 01/06/22   Elodia Florence., MD  hydrALAZINE (APRESOLINE) 100 MG tablet Take 1 tablet (100 mg total) by mouth every 8 (eight) hours. 01/06/22 03/07/22  Elodia Florence., MD  Insulin Glargine [facility]) 100 UNIT/ML Inject 18 Units into the skin daily. 01/06/22 03/14/22  Elodia Florence., MD  Insulin Pen Needle (TRUEPLUS 5-BEVEL PEN NEEDLES) 32G X 4 MM MISC Use to inject basaglar once daily. 01/30/22   Camillia Herter, NP  metoCLOPramide (REGLAN) 5 MG tablet Take 1 tablet (5 mg total) by mouth 4 (four) times daily -  before meals  and at bedtime for 14 days. Please discuss with your PCP whether this needs to be continued 01/06/22 01/21/22  Elodia Florence., MD  omeprazole (PRILOSEC) 20 MG capsule Take 2 capsules (40 mg total) by mouth daily. 01/06/22 03/08/22  Elodia Florence., MD  TRUEplus Lancets 28G MISC USE UP TO 4 TIMES DAILY 01/06/22   Elodia Florence., MD      Allergies    Tomato    Review of Systems   Review of Systems  Gastrointestinal:  Positive for abdominal pain.  All other systems reviewed and are negative.   Physical Exam Updated Vital Signs Pulse 77   Temp 98.2 F (36.8 C) (Oral)   Resp 18   SpO2 100%  Physical Exam Vitals and nursing note reviewed.  Constitutional:      Appearance: He is well-developed.  HENT:     Head: Normocephalic and atraumatic.  Cardiovascular:     Rate and Rhythm: Normal rate and regular rhythm.     Heart sounds: No murmur heard. Pulmonary:     Effort: Pulmonary effort is normal. No respiratory distress.     Breath sounds: Normal breath sounds.  Abdominal:     Palpations: Abdomen is soft.     Tenderness: There is abdominal tenderness. There is no guarding or rebound.  Musculoskeletal:        General: No swelling or tenderness.  Skin:    General: Skin is warm and dry.  Neurological:     Mental Status: He is alert and oriented to person, place, and time.  Psychiatric:        Behavior: Behavior normal.     ED Results / Procedures / Treatments   Labs (all labs ordered are listed, but only abnormal results are displayed) Labs Reviewed  CBC WITH DIFFERENTIAL/PLATELET - Abnormal; Notable for the following components:      Result Value   RBC 3.56 (*)    Hemoglobin 9.8 (*)    HCT 30.2 (*)    All other components within normal limits  COMPREHENSIVE METABOLIC PANEL  LIPASE, BLOOD  URINALYSIS, ROUTINE W REFLEX MICROSCOPIC    EKG None  Radiology No results found.  Procedures Procedures  {Document cardiac monitor, telemetry assessment  procedure when appropriate:1}  Medications Ordered in ED Medications  HYDROmorphone (DILAUDID) injection 1 mg (has no administration in time range)  sodium chloride 0.9 % bolus 1,000 mL (has no administration in time range)  ondansetron (ZOFRAN) injection 4 mg (has no administration in time range)    ED Course/ Medical Decision Making/ A&P                           Medical Decision Making Risk Prescription drug management.   ***  {Document critical care time when appropriate:1} {Document review of labs and clinical decision tools ie heart score, Chads2Vasc2 etc:1}  {Document your independent review of radiology images, and any outside records:1} {Document your discussion with family members, caretakers, and with consultants:1} {Document social determinants of health affecting pt's care:1} {Document your decision making why or why not admission, treatments were needed:1} Final Clinical Impression(s) / ED Diagnoses Final diagnoses:  None    Rx / DC Orders ED Discharge Orders     None

## 2022-03-26 NOTE — ED Triage Notes (Signed)
Pt BIB EMS from home with c/o N/V and abdominal pain x 3 days. Has hx of pancreatitis

## 2022-03-26 NOTE — ED Provider Triage Note (Signed)
Emergency Medicine Provider Triage Evaluation Note  Randy [name]. , a 41 y.o. male  was evaluated in triage.  Pt complains of abdominal pain associate with nausea and vomiting x3 days.  History of pancreatitis.  Denies alcohol use.  Admits to marijuana use. Patient is legally blind.  Review of Systems  Positive: Abdominal pain, N/V Negative: fever  Physical Exam  Pulse 77   Temp 98.2 F (36.8 C) (Oral)   Resp 18   SpO2 100%  Gen:   Awake, no distress   Resp:  Normal effort  MSK:   Moves extremities without difficulty  Other:    Medical Decision Making  Medically screening exam initiated at 10:21 PM.  Appropriate orders placed.  Randy [name]. was informed that the remainder of the evaluation will be completed by another provider, this initial triage assessment does not replace that evaluation, and the importance of remaining in the ED until their evaluation is complete.  Labs CT abdomen   Randy [name] 03/26/22 2222

## 2022-03-27 ENCOUNTER — Other Ambulatory Visit: Payer: Self-pay

## 2022-03-27 ENCOUNTER — Emergency Department (HOSPITAL_COMMUNITY): Payer: Self-pay

## 2022-03-27 DIAGNOSIS — D649 Anemia, unspecified: Secondary | ICD-10-CM | POA: Diagnosis present

## 2022-03-27 DIAGNOSIS — F1290 Cannabis use, unspecified, uncomplicated: Secondary | ICD-10-CM | POA: Diagnosis present

## 2022-03-27 DIAGNOSIS — R112 Nausea with vomiting, unspecified: Secondary | ICD-10-CM

## 2022-03-27 DIAGNOSIS — N1831 Chronic kidney disease, stage 3a: Secondary | ICD-10-CM | POA: Diagnosis present

## 2022-03-27 DIAGNOSIS — R748 Abnormal levels of other serum enzymes: Secondary | ICD-10-CM | POA: Diagnosis present

## 2022-03-27 LAB — COMPREHENSIVE METABOLIC PANEL
ALT: 17 U/L (ref 0–44)
AST: 22 U/L (ref 15–41)
Albumin: 3.6 g/dL (ref 3.5–5.0)
Alkaline Phosphatase: 105 U/L (ref 38–126)
Anion gap: 7 (ref 5–15)
BUN: 23 mg/dL — ABNORMAL HIGH (ref 6–20)
CO2: 24 mmol/L (ref 22–32)
Calcium: 8.5 mg/dL — ABNORMAL LOW (ref 8.9–10.3)
Chloride: 102 mmol/L (ref 98–111)
Creatinine, Ser: 1.43 mg/dL — ABNORMAL HIGH (ref 0.61–1.24)
GFR, Estimated: >60 mL/min (ref >60)
Glucose, Bld: 207 mg/dL — ABNORMAL HIGH (ref 70–99)
Potassium: 4 mmol/L (ref 3.5–5.1)
Sodium: 133 mmol/L — ABNORMAL LOW (ref 135–145)
Total Bilirubin: 0.8 mg/dL (ref 0.3–1.2)
Total Protein: 7.9 g/dL (ref 6.5–8.1)

## 2022-03-27 LAB — CBC
HCT: 32.2 % — ABNORMAL LOW (ref 39.0–52.0)
Hemoglobin: 10.5 g/dL — ABNORMAL LOW (ref 13.0–17.0)
MCH: 27.6 pg (ref 26.0–34.0)
MCHC: 32.6 g/dL (ref 30.0–36.0)
MCV: 84.7 fL (ref 80.0–100.0)
Platelets: 307 10*3/uL (ref 150–400)
RBC: 3.8 MIL/uL — ABNORMAL LOW (ref 4.22–5.81)
RDW: 13.1 % (ref 11.5–15.5)
WBC: 10.2 10*3/uL (ref 4.0–10.5)
nRBC: 0 % (ref 0.0–0.2)

## 2022-03-27 LAB — GLUCOSE, CAPILLARY
Glucose-Capillary: 120 mg/dL — ABNORMAL HIGH (ref 70–99)
Glucose-Capillary: 137 mg/dL — ABNORMAL HIGH (ref 70–99)
Glucose-Capillary: 145 mg/dL — ABNORMAL HIGH (ref 70–99)

## 2022-03-27 LAB — URINALYSIS, ROUTINE W REFLEX MICROSCOPIC
Bilirubin Urine: NEGATIVE
Glucose, UA: NEGATIVE mg/dL
Ketones, ur: NEGATIVE mg/dL
Nitrite: NEGATIVE
Protein, ur: 100 mg/dL — AB
Specific Gravity, Urine: 1.013 (ref 1.005–1.030)
pH: 7 (ref 5.0–8.0)

## 2022-03-27 LAB — MAGNESIUM: Magnesium: 2 mg/dL (ref 1.7–2.4)

## 2022-03-27 LAB — CBG MONITORING, ED: Glucose-Capillary: 187 mg/dL — ABNORMAL HIGH (ref 70–99)

## 2022-03-27 LAB — POC OCCULT BLOOD, ED: Fecal Occult Bld: NEGATIVE

## 2022-03-27 MED ORDER — ONDANSETRON HCL 4 MG PO TABS: 4.0000 mg | ORAL_TABLET | Freq: Four times a day (QID) | ORAL | Status: DC | PRN

## 2022-03-27 MED ORDER — SODIUM CHLORIDE 0.9 % IV BOLUS
500.0000 mL | Freq: Once | INTRAVENOUS | Status: AC
  Administered 2022-03-27: 500 mL via INTRAVENOUS

## 2022-03-27 MED ORDER — PANTOPRAZOLE SODIUM 40 MG IV SOLR
40.0000 mg | Freq: Two times a day (BID) | INTRAVENOUS | Status: DC
  Administered 2022-03-27 – 2022-03-28 (×3): 40 mg via INTRAVENOUS
  Filled 2022-03-27 (×3): qty 10

## 2022-03-27 MED ORDER — ONDANSETRON HCL 4 MG/2ML IJ SOLN
4.0000 mg | Freq: Once | INTRAMUSCULAR | Status: AC
  Administered 2022-03-27: 4 mg via INTRAVENOUS
  Filled 2022-03-27: qty 2

## 2022-03-27 MED ORDER — METOCLOPRAMIDE HCL 5 MG/ML IJ SOLN
10.0000 mg | Freq: Once | INTRAMUSCULAR | Status: AC
  Administered 2022-03-27: 10 mg via INTRAVENOUS
  Filled 2022-03-27: qty 2

## 2022-03-27 MED ORDER — FENTANYL CITRATE PF 50 MCG/ML IJ SOSY
50.0000 ug | PREFILLED_SYRINGE | Freq: Once | INTRAMUSCULAR | Status: AC
  Administered 2022-03-27: 50 ug via INTRAVENOUS
  Filled 2022-03-27: qty 1

## 2022-03-27 MED ORDER — ALUM & MAG HYDROXIDE-SIMETH 200-200-20 MG/5ML PO SUSP
30.0000 mL | Freq: Once | ORAL | Status: AC
  Administered 2022-03-27: 30 mL via ORAL
  Filled 2022-03-27: qty 30

## 2022-03-27 MED ORDER — BASAGLAR KWIKPEN 100 UNIT/ML ~~LOC~~ SOPN: 18.0000 [IU] | PEN_INJECTOR | Freq: Every day | SUBCUTANEOUS | Status: DC

## 2022-03-27 MED ORDER — HYDRALAZINE HCL 20 MG/ML IJ SOLN
20.0000 mg | INTRAMUSCULAR | Status: DC | PRN
  Administered 2022-03-27 – 2022-03-28 (×4): 20 mg via INTRAVENOUS
  Filled 2022-03-27 (×4): qty 1

## 2022-03-27 MED ORDER — METOCLOPRAMIDE HCL 5 MG/ML IJ SOLN
10.0000 mg | Freq: Four times a day (QID) | INTRAMUSCULAR | Status: DC
  Administered 2022-03-27 – 2022-03-28 (×5): 10 mg via INTRAVENOUS
  Filled 2022-03-27 (×6): qty 2

## 2022-03-27 MED ORDER — PANTOPRAZOLE SODIUM 40 MG IV SOLR
40.0000 mg | Freq: Once | INTRAVENOUS | Status: AC
  Administered 2022-03-27: 40 mg via INTRAVENOUS
  Filled 2022-03-27: qty 10

## 2022-03-27 MED ORDER — AMLODIPINE BESYLATE 10 MG PO TABS
10.0000 mg | ORAL_TABLET | Freq: Every day | ORAL | Status: DC
  Administered 2022-03-27 – 2022-03-28 (×2): 10 mg via ORAL
  Filled 2022-03-27: qty 1
  Filled 2022-03-27: qty 2

## 2022-03-27 MED ORDER — HYDROMORPHONE HCL 1 MG/ML IJ SOLN
1.0000 mg | INTRAMUSCULAR | Status: DC | PRN
  Administered 2022-03-27 – 2022-03-28 (×5): 1 mg via INTRAVENOUS
  Filled 2022-03-27 (×7): qty 1

## 2022-03-27 MED ORDER — OMEPRAZOLE 20 MG PO CPDR
20.0000 mg | DELAYED_RELEASE_CAPSULE | Freq: Every day | ORAL | 0 refills | Status: DC
  Filled 2022-03-27 – 2022-04-03 (×2): qty 30, 30d supply, fill #0

## 2022-03-27 MED ORDER — CARVEDILOL 12.5 MG PO TABS
25.0000 mg | ORAL_TABLET | Freq: Once | ORAL | Status: AC
  Administered 2022-03-27: 25 mg via ORAL
  Filled 2022-03-27: qty 2

## 2022-03-27 MED ORDER — ONDANSETRON HCL 4 MG/2ML IJ SOLN
4.0000 mg | Freq: Four times a day (QID) | INTRAMUSCULAR | Status: DC | PRN
  Administered 2022-03-27 (×2): 4 mg via INTRAVENOUS
  Filled 2022-03-27 (×2): qty 2

## 2022-03-27 MED ORDER — CARVEDILOL 25 MG PO TABS
25.0000 mg | ORAL_TABLET | Freq: Two times a day (BID) | ORAL | Status: DC
  Administered 2022-03-27 – 2022-03-28 (×3): 25 mg via ORAL
  Filled 2022-03-27 (×2): qty 2
  Filled 2022-03-27: qty 1

## 2022-03-27 MED ORDER — INSULIN ASPART 100 UNIT/ML IJ SOLN: 0.0000 [IU] | Freq: Four times a day (QID) | INTRAMUSCULAR | Status: DC

## 2022-03-27 MED ORDER — HYDRALAZINE HCL 25 MG PO TABS
100.0000 mg | ORAL_TABLET | Freq: Once | ORAL | Status: AC
  Administered 2022-03-27: 100 mg via ORAL
  Filled 2022-03-27: qty 4

## 2022-03-27 MED ORDER — ONDANSETRON 4 MG PO TBDP
4.0000 mg | ORAL_TABLET | Freq: Three times a day (TID) | ORAL | 0 refills | Status: DC | PRN
  Filled 2022-03-27: qty 20, 7d supply, fill #0

## 2022-03-27 MED ORDER — SODIUM CHLORIDE 0.9 % IV SOLN: INTRAVENOUS | Status: DC

## 2022-03-27 MED ORDER — ACETAMINOPHEN 325 MG PO TABS
650.0000 mg | ORAL_TABLET | Freq: Four times a day (QID) | ORAL | Status: DC | PRN
  Administered 2022-03-27: 650 mg via ORAL
  Filled 2022-03-27: qty 2

## 2022-03-27 MED ORDER — INSULIN GLARGINE-YFGN 100 UNIT/ML ~~LOC~~ SOLN
18.0000 [IU] | Freq: Every day | SUBCUTANEOUS | Status: DC
  Administered 2022-03-27 – 2022-03-28 (×2): 18 [IU] via SUBCUTANEOUS
  Filled 2022-03-27 (×2): qty 0.18

## 2022-03-27 MED ORDER — ACETAMINOPHEN 650 MG RE SUPP: 650.0000 mg | Freq: Four times a day (QID) | RECTAL | Status: DC | PRN

## 2022-03-27 NOTE — H&P (Addendum)
History and Physical    Patient: Randy [name]. QQI:[id number] DOB: 1980-06-22 DOA: 03/26/2022 DOS: the patient was seen and examined on 03/27/2022 PCP: Camillia Herter, NP  Patient coming from: Home  Chief Complaint:  Chief Complaint  Patient presents with   Abdominal Pain   HPI: Randy [name]. is a 41 y.o. male with medical history significant of hypertension, GERD, erosive gastritis on EGD, stage IIIa CKD, type 2 diabetes, normocytic anemia, class I obesity with pending current weight/BMI who is coming to the emergency department due to abdominal pain, nausea and vomiting for the past 3 days after he smoked "2 joints" of cannabis.  He has been vomiting multiple times daily since associated with severe epigastric pain similar to his previous pancreatitis episodes. No diarrhea, constipation, melena or hematochezia.  No flank pain, dysuria, frequency or hematuria. He denied fever, chills, rhinorrhea, sore throat, wheezing or hemoptysis.  No chest pain, palpitations, diaphoresis, PND, orthopnea or pitting edema of the lower extremities.  No polyuria, polydipsia, polyphagia or blurred vision.  ED course: Initial vital signs were temperature 98.2 F, pulse 77, respiration 18, BP 1 203/99 mmHg and O2 sat 100% on room air.  The patient received 30 mL of Maalox, carvedilol 25 mg p.o. x1, fentanyl 50 mcg IVP x1, hydralazine 100 mg IVP x1, hydromorphone 1 mg IVP x1, metoclopramide 10 mg IVP x1, ondansetron 4 mg IVP x2, pantoprazole 40 mg IVPB and 1500 mL of normal saline bolus.  Lab work: His urinalysis with small hemoglobinuria, proteinuria 100 mg deciliter and trace leukocyte esterase.  CBC showed a white count of 9.1, hemoglobin 9.8 g/dL and platelets 292.  Lipase was elevated at 133.  CMP sodium 134, the rest of the electrolytes are normal after calcium correction.  Glucose 120, BUN 30, creatinine 1.52 mg/deciliter.  Albumin is 3.4 mg/dL.  Imaging: CT abdomen/pelvis with contrast with no  acute or pelvic abnormality.  No findings of acute pancreatitis.  Positive diverticulosis without diverticulitis.  Prostatomegaly and generalized bladder thickening.   Review of Systems: As mentioned in the history of present illness. All other systems reviewed and are negative. Past Medical History:  Diagnosis Date   Essential hypertension 05/31/2019   HLD (hyperlipidemia)    Pancreatitis    Type II diabetes mellitus ([place])    Past Surgical History:  Procedure Laterality Date   NO PAST SURGERIES     Social History:  reports that he quit smoking about 10 years ago. His smoking use included cigarettes. He has a 5.61 pack-year smoking history. He has been exposed to tobacco smoke. He has never used smokeless tobacco. He reports current drug use. Drug: Marijuana. He reports that he does not drink alcohol.  Allergies  Allergen Reactions   Tomato Other (See Comments)    Unknown reaction    Family History  Problem Relation Age of Onset   Hypertension Mother    Diabetes Father    Hypertension Father    Diabetes Brother    Diabetes Maternal Grandmother    Colon cancer Neg Hx    Esophageal cancer Neg Hx    Rectal cancer Neg Hx    Stomach cancer Neg Hx     Prior to Admission medications   Medication Sig Start Date End Date Taking? Authorizing Provider  omeprazole (PRILOSEC) 20 MG capsule Take 1 capsule (20 mg total) by mouth daily. 03/27/22  Yes Quintella Reichert, MD  ondansetron (ZOFRAN-ODT) 4 MG disintegrating tablet Take 1 tablet (4 mg total) by mouth  every 8 (eight) hours as needed for nausea or vomiting. 03/27/22  Yes Quintella Reichert, MD  amLODipine (NORVASC) 5 MG tablet Take 2 tablets (10 mg total) by mouth daily. 01/06/22 03/08/22  Elodia Florence., MD  atorvastatin (LIPITOR) 40 MG tablet Take 2 tablets (80 mg total) by mouth daily. 01/06/22 05/06/22  Elodia Florence., MD  blood glucose meter kit and supplies KIT Dispense based on patient and insurance preference. Use up  to four times daily as directed. 01/06/22   Elodia Florence., MD  carvedilol (COREG) 25 MG tablet Take 1 tablet (25 mg total) by mouth 2 (two) times daily with a meal. 01/06/22 05/06/22  Elodia Florence., MD  glucose blood (ACCU-CHEK GUIDE) test strip Use to check blood sugar 3 times daily. 10/15/21   Charlott Rakes, MD  glucose blood test strip USE UP TO FOUR TIMES DAILY AS DIRECTED 01/06/22   Elodia Florence., MD  hydrALAZINE (APRESOLINE) 100 MG tablet Take 1 tablet (100 mg total) by mouth every 8 (eight) hours. 01/06/22 05/18/22  Elodia Florence., MD  Insulin Glargine [facility]) 100 UNIT/ML Inject 18 Units into the skin daily. 01/06/22 05/18/22  Elodia Florence., MD  Insulin Pen Needle (TRUEPLUS 5-BEVEL PEN NEEDLES) 32G X 4 MM MISC Use to inject basaglar once daily. 01/30/22   Camillia Herter, NP  metoCLOPramide (REGLAN) 5 MG tablet Take 1 tablet (5 mg total) by mouth 4 (four) times daily -  before meals and at bedtime for 14 days. Please discuss with your PCP whether this needs to be continued Patient taking differently: Take 5 mg by mouth 4 (four) times daily -  before meals and at bedtime. 01/06/22 05/18/22  Elodia Florence., MD  TRUEplus Lancets 28G MISC USE UP TO 4 TIMES DAILY 01/06/22   Elodia Florence., MD    Physical Exam: Vitals:   03/27/22 0400 03/27/22 0408 03/27/22 0415 03/27/22 0425  BP: (!) 177/121  (!) 194/97   Pulse: 86  86   Resp:  18 18   Temp:    98 F (36.7 C)  TempSrc:      SpO2: 96%  97%    Physical Exam Vitals and nursing note reviewed.  Constitutional:      General: He is awake. He is not in acute distress.    Appearance: He is well-developed. He is obese.  HENT:     Head: Normocephalic.     Nose: No rhinorrhea.     Mouth/Throat:     Mouth: Mucous membranes are dry.  Eyes:     General: No scleral icterus.    Pupils: Pupils are equal, round, and reactive to light.  Neck:     Vascular: No JVD.  Cardiovascular:     Rate  and Rhythm: Normal rate and regular rhythm.     Heart sounds: S1 normal and S2 normal.  Pulmonary:     Effort: Pulmonary effort is normal.     Breath sounds: Normal breath sounds. No wheezing, rhonchi or rales.  Abdominal:     General: Abdomen is flat. Bowel sounds are normal. There is no distension.     Palpations: Abdomen is soft.     Tenderness: There is abdominal tenderness in the epigastric area. There is no right CVA tenderness, left CVA tenderness, guarding or rebound.  Musculoskeletal:     Cervical back: Neck supple.     Right lower leg: No edema.  Left lower leg: No edema.  Skin:    General: Skin is warm and dry.  Neurological:     General: No focal deficit present.     Mental Status: He is alert and oriented to person, place, and time.  Psychiatric:        Mood and Affect: Mood normal.        Behavior: Behavior normal. Behavior is cooperative.    Data Reviewed:  Results are pending, will review when available.  Assessment and Plan: Principal Problem:    Intractable nausea and vomiting Superimposed on:     GERD (gastroesophageal reflux disease) With history of erosive gastritis on EGD Likely due to:   Cannabinoid hyperemesis syndrome   Cannabis abuse Observation/MedSurg. Keep NPO for now.. Continue IV fluids. Analgesics as needed. Antiemetics as needed. Pantoprazole 40 mg IVP every 12 hours. CBC and CMP now. Keep electrolytes optimized. Follow-up CBC and CMP in AM. Cannabis use cessation advised.  Active Problems:   Elevated lipase Normal pancreas on imaging. Follow level as needed.    Essential hypertension Currently with   Hypertensive urgency Continue amlodipine 10 mg p.o. daily.   Continue carvedilol 25 mg p.o. twice daily. Hydralazine 20 mg IVP every 4 hours as needed. Resume oral hydralazine once tolerating p.o. Monitor blood pressure and heart rate.    Hyponatremia Secondary to GI losses. Continue IV fluids. Follow-up sodium  level.    Normocytic anemia  Repeat H&H in AM. Will consider GI reevaluation.    Stage 3a chronic kidney disease (CKD) (HCC) Monitor renal function electrolytes.    Hyperlipidemia associated with type 2 diabetes mellitus ([place]) Hold atorvastatin until tolerating oral intake.    Type 2 diabetes mellitus with hyperglycemia,     with long-term current use of insulin (HCC) Currently NPO. Continue Lantus 18 units daily. CBG monitoring every 6 hours.    Advance Care Planning:   Code Status: Full Code   Consults: [facility].  Family Communication:   Severity of Illness: The appropriate patient status for this patient is OBSERVATION. Observation status is judged to be reasonable and necessary in order to provide the required intensity of service to ensure the patient's safety. The patient's presenting symptoms, physical exam findings, and initial radiographic and laboratory data in the context of their medical condition is felt to place them at decreased risk for further clinical deterioration. Furthermore, it is anticipated that the patient will be medically stable for discharge from the hospital within 2 midnights of admission.   Author: Reubin Milan, MD 03/27/2022 7:42 AM  For on call review [email or web address].   This document was prepared using Dragon voice recognition software and may contain some unintended transcription errors.

## 2022-03-27 NOTE — ED Notes (Signed)
ED TO INPATIENT HANDOFF REPORT  Name/Age/Gender Randy [name]. 41 y.o. male  Code Status    Code Status Orders  (From admission, onward)           Start     Ordered   03/27/22 0739  Full code  Continuous        03/27/22 0739           Code Status History     Date Active Date Inactive Code Status Order ID Comments User Context   01/04/2022 0135 01/06/2022 2006 Full Code [id number]  Anselm Jungling, DO ED   09/11/2021 2153 09/13/2021 2032 Full Code [id number]  Charlsie Quest, MD ED   06/18/2021 0813 06/21/2021 2047 Full Code [id number]  Jonah Blue, MD ED   04/02/2021 0524 04/04/2021 1623 Full Code [id number]  John Giovanni, MD ED   09/17/2020 0715 09/17/2020 1406 Full Code [id number]  Marinda Elk, MD ED   09/12/2020 0403 09/13/2020 2117 Full Code [id number]  Angie Fava, DO ED   04/08/2020 2305 04/11/2020 1908 Full Code [id number]  Shirlean Mylar, MD ED   08/27/2019 1351 08/28/2019 1834 Full Code [id number]  Verdene Lennert, MD ED   08/31/2015 1839 09/02/2015 1529 Full Code [id number]  Rama, Maryruth Bun, MD Inpatient       Home/SNF/Other Home  Chief Complaint Nausea and vomiting in adult [R11.2] Intractable nausea and vomiting [R11.2]  Level of Care/Admitting Diagnosis ED Disposition     ED Disposition  Admit   Condition  --   Comment  Hospital Area: [facility] [[id number]]  Level of Care: Telemetry [5]  Admit to tele based on following criteria: Monitor QTC interval  May place patient in observation at [facility] or [facility] if equivalent level of care is available:: No  Covid Evaluation: Asymptomatic - no recent exposure (last 10 days) testing not required  Diagnosis: Intractable nausea and vomiting [720114]  Admitting Physician: Bobette Mo [[id number]]  Attending Physician: Bobette Mo [[id number]]          Medical History Past Medical History:  Diagnosis Date   Essential hypertension 05/31/2019   HLD  (hyperlipidemia)    Pancreatitis    Type II diabetes mellitus (HCC)     Allergies Allergies  Allergen Reactions   Tomato Itching    IV Location/Drains/Wounds Patient Lines/Drains/Airways Status     Active Line/Drains/Airways     Name Placement date Placement time Site Days   Peripheral IV 03/26/22 22 G 2.5" Anterior;Left;Proximal Forearm 03/26/22  2302  Forearm  1            Labs/Imaging Results for orders placed or performed during the hospital encounter of 03/26/22 (from the past 48 hour(s))  CBC with Differential     Status: Abnormal   Collection Time: 03/26/22 11:00 PM  Result Value Ref Range   WBC 9.1 4.0 - 10.5 K/uL   RBC 3.56 (L) 4.22 - 5.81 MIL/uL   Hemoglobin 9.8 (L) 13.0 - 17.0 g/dL   HCT 82.5 (L) 05.3 - 97.6 %   MCV 84.8 80.0 - 100.0 fL   MCH 27.5 26.0 - 34.0 pg   MCHC 32.5 30.0 - 36.0 g/dL   RDW 73.4 19.3 - 79.0 %   Platelets 292 150 - 400 K/uL   nRBC 0.0 0.0 - 0.2 %   Neutrophils Relative % 65 %   Neutro Abs 5.9 1.7 - 7.7 K/uL   Lymphocytes Relative 25 %   Lymphs Abs 2.3  0.7 - 4.0 K/uL   Monocytes Relative 7 %   Monocytes Absolute 0.6 0.1 - 1.0 K/uL   Eosinophils Relative 2 %   Eosinophils Absolute 0.2 0.0 - 0.5 K/uL   Basophils Relative 1 %   Basophils Absolute 0.1 0.0 - 0.1 K/uL   Immature Granulocytes 0 %   Abs Immature Granulocytes 0.03 0.00 - 0.07 K/uL    Comment: Performed at [facility], [address] [place], [place] [address]  Comprehensive metabolic panel     Status: Abnormal   Collection Time: 03/26/22 11:00 PM  Result Value Ref Range   Sodium 134 (L) 135 - 145 mmol/L   Potassium 3.9 3.5 - 5.1 mmol/L   Chloride 103 98 - 111 mmol/L   CO2 26 22 - 32 mmol/L   Glucose, Bld 120 (H) 70 - 99 mg/dL    Comment: Glucose reference range applies only to samples taken after fasting for at least 8 hours.   BUN 30 (H) 6 - 20 mg/dL   Creatinine, Ser 8.291.52 (H) 0.61 - 1.24 mg/dL   Calcium 8.4 (L) 8.9 - 10.3 mg/dL   Total  Protein 7.6 6.5 - 8.1 g/dL   Albumin 3.4 (L) 3.5 - 5.0 g/dL   AST 25 15 - 41 U/L   ALT 19 0 - 44 U/L   Alkaline Phosphatase 111 38 - 126 U/L   Total Bilirubin 0.6 0.3 - 1.2 mg/dL   GFR, Estimated 58 (L) >60 mL/min    Comment: (NOTE) Calculated using the CKD-EPI Creatinine Equation (2021)    Anion gap 5 5 - 15    Comment: Performed at [facility], [address] [place], [place] [address]  Lipase, blood     Status: Abnormal   Collection Time: 03/26/22 11:00 PM  Result Value Ref Range   Lipase 133 (H) 11 - 51 U/L    Comment: Performed at [facility], [address] [place], [place] [address]  Urinalysis, Routine w reflex microscopic Urine, Clean Catch     Status: Abnormal   Collection Time: 03/26/22 11:39 PM  Result Value Ref Range   Color, Urine STRAW (A) YELLOW   APPearance CLEAR CLEAR   Specific Gravity, Urine 1.013 1.005 - 1.030   pH 7.0 5.0 - 8.0   Glucose, UA NEGATIVE NEGATIVE mg/dL   Hgb urine dipstick SMALL (A) NEGATIVE   Bilirubin Urine NEGATIVE NEGATIVE   Ketones, ur NEGATIVE NEGATIVE mg/dL   Protein, ur 846100 (A) NEGATIVE mg/dL   Nitrite NEGATIVE NEGATIVE   Leukocytes,Ua TRACE (A) NEGATIVE   RBC / HPF 0-5 0 - 5 RBC/hpf   WBC, UA 6-10 0 - 5 WBC/hpf   Bacteria, UA RARE (A) NONE SEEN   Squamous Epithelial / LPF 0-5 0 - 5   Mucus PRESENT     Comment: Performed at [facility], [address] [place], [place] [address]  POC occult blood, ED     Status: None   Collection Time: 03/27/22  1:30 AM  Result Value Ref Range   Fecal Occult Bld NEGATIVE NEGATIVE  CBC     Status: Abnormal   Collection Time: 03/27/22  9:26 AM  Result Value Ref Range   WBC 10.2 4.0 - 10.5 K/uL    Comment: WHITE COUNT CONFIRMED ON SMEAR   RBC 3.80 (L) 4.22 - 5.81 MIL/uL   Hemoglobin 10.5 (L) 13.0 - 17.0 g/dL   HCT 28.432.2 (L) 13.239.0 - 44.052.0 %   MCV  84.7 80.0 - 100.0 fL   MCH 27.6 26.0 - 34.0 pg   MCHC 32.6 30.0 - 36.0 g/dL   RDW 85.4  62.7 - 03.5 %   Platelets 307 150 - 400 K/uL   nRBC 0.0 0.0 - 0.2 %    Comment: Performed at [facility], [address], [place], [place] [address]  Comprehensive metabolic panel     Status: Abnormal   Collection Time: 03/27/22  9:26 AM  Result Value Ref Range   Sodium 133 (L) 135 - 145 mmol/L   Potassium 4.0 3.5 - 5.1 mmol/L   Chloride 102 98 - 111 mmol/L   CO2 24 22 - 32 mmol/L   Glucose, Bld 207 (H) 70 - 99 mg/dL    Comment: Glucose reference range applies only to samples taken after fasting for at least 8 hours.   BUN 23 (H) 6 - 20 mg/dL   Creatinine, Ser 1.82 (H) 0.61 - 1.24 mg/dL   Calcium 8.5 (L) 8.9 - 10.3 mg/dL   Total Protein 7.9 6.5 - 8.1 g/dL   Albumin 3.6 3.5 - 5.0 g/dL   AST 22 15 - 41 U/L   ALT 17 0 - 44 U/L   Alkaline Phosphatase 105 38 - 126 U/L   Total Bilirubin 0.8 0.3 - 1.2 mg/dL   GFR, Estimated >99 >37 mL/min    Comment: (NOTE) Calculated using the CKD-EPI Creatinine Equation (2021)    Anion gap 7 5 - 15    Comment: Performed at [facility], [address] [place], [place] [address]  Magnesium     Status: None   Collection Time: 03/27/22  9:26 AM  Result Value Ref Range   Magnesium 2.0 1.7 - 2.4 mg/dL    Comment: Performed at [facility], [address], [place], [place] [address]  CBG monitoring, ED     Status: Abnormal   Collection Time: 03/27/22 12:02 PM  Result Value Ref Range   Glucose-Capillary 187 (H) 70 - 99 mg/dL    Comment: Glucose reference range applies only to samples taken after fasting for at least 8 hours.   CT ABDOMEN PELVIS W CONTRAST  Result Date: 03/27/2022 CLINICAL DATA:  Acute pancreatitis with abdominal pain for 3 days and history of pancreatitis. EXAM: CT ABDOMEN AND PELVIS WITH CONTRAST TECHNIQUE: Multidetector CT imaging of the abdomen and pelvis was performed using the standard protocol following bolus administration of intravenous contrast. RADIATION DOSE  REDUCTION: This exam was performed according to the departmental dose-optimization program which includes automated exposure control, adjustment of the mA and/or kV according to patient size and/or use of iterative reconstruction technique. CONTRAST:  30mL OMNIPAQUE IOHEXOL 300 MG/ML  SOLN COMPARISON:  CT with IV contrast 01/03/2022 and 08/01/2021 FINDINGS: Lower chest: No acute abnormality. Hepatobiliary: No focal liver abnormality is seen. No calcified gallstones, gallbladder wall thickening, or biliary dilatation. Pancreas: Unremarkable. No pancreatic ductal dilatation or surrounding inflammatory changes. Spleen: Normal. Adrenals/Urinary Tract: There is no adrenal mass. There is homogeneous bilateral renal cortical enhancement without evidence for masses or pyelonephritis. There is no urinary stone or obstruction. Generalized mild thickening of the bladder versus underdistention is again noted. Stomach/Bowel: No dilatation or wall thickening including the appendix. There are colonic diverticula without evidence of acute colitis or diverticulitis. Vascular/Lymphatic: Unremarkable abdominal aorta. Trace calcification in the common iliac arteries. No other significant vascular findings. No adenopathy is seen. Reproductive: There is mild prostatomegaly. Calcifications in the bilateral vasa deferentia are again  shown. Other: There is no incarcerated hernia. There is no free air, free hemorrhage or free fluid. Musculoskeletal: No acute or significant osseous findings. IMPRESSION: 1. No acute abdominal or pelvic abnormality is seen. 2. No CT findings of acute pancreatitis. 3. Diverticulosis without evidence of diverticulitis. 4. Prostatomegaly. 5. Generalized bladder thickening versus nondistention. Correlate clinically for cystitis or bladder hypertrophy. Electronically Signed   By: Almira Bar M.D.   On: 03/27/2022 00:29    Pending Labs Unresulted Labs (From admission, onward)     Start     Ordered   03/28/22  0500  CBC  Tomorrow morning,   R        03/27/22 0739   03/28/22 0500  Comprehensive metabolic panel  Tomorrow morning,   R        03/27/22 0739   03/27/22 0633  Urine Culture  (Urine Culture)  Once,   R       Question:  Indication  Answer:  Dysuria   03/27/22 0633            Vitals/Pain Today's Vitals   03/27/22 1206 03/27/22 1306 03/27/22 1515 03/27/22 1607  BP: (!) 156/113  (!) 156/94   Pulse: 84  83   Resp: (!) 23  (!) 21   Temp: 98 F (36.7 C)   97.6 F (36.4 C)  TempSrc:    Oral  SpO2: 99%  98%   PainSc:  5       Isolation Precautions No active isolations  Medications Medications  hydrALAZINE (APRESOLINE) injection 20 mg (20 mg Intravenous Given 03/27/22 1217)  0.9 %  sodium chloride infusion ( Intravenous New Bag/Given 03/27/22 0833)  metoCLOPramide (REGLAN) injection 10 mg (10 mg Intravenous Given 03/27/22 1208)  acetaminophen (TYLENOL) tablet 650 mg (has no administration in time range)    Or  acetaminophen (TYLENOL) suppository 650 mg (has no administration in time range)  ondansetron (ZOFRAN) tablet 4 mg ( Oral See Alternative 03/27/22 0815)    Or  ondansetron (ZOFRAN) injection 4 mg (4 mg Intravenous Given 03/27/22 0815)  HYDROmorphone (DILAUDID) injection 1 mg (1 mg Intravenous Given 03/27/22 1209)  pantoprazole (PROTONIX) injection 40 mg (40 mg Intravenous Given 03/27/22 0944)  amLODipine (NORVASC) tablet 10 mg (10 mg Oral Given 03/27/22 0943)  carvedilol (COREG) tablet 25 mg (25 mg Oral Given 03/27/22 0943)  insulin glargine-yfgn (SEMGLEE) injection 18 Units (18 Units Subcutaneous Given 03/27/22 1242)  HYDROmorphone (DILAUDID) injection 1 mg (1 mg Intravenous Given 03/27/22 0040)  sodium chloride 0.9 % bolus 1,000 mL (1,000 mLs Intravenous Bolus 03/27/22 0040)  ondansetron (ZOFRAN) injection 4 mg (4 mg Intravenous Given 03/27/22 0040)  iohexol (OMNIPAQUE) 300 MG/ML solution 85 mL (85 mLs Intravenous Contrast Given 03/27/22 0007)  pantoprazole  (PROTONIX) injection 40 mg (40 mg Intravenous Given 03/27/22 0206)  fentaNYL (SUBLIMAZE) injection 50 mcg (50 mcg Intravenous Given 03/27/22 0205)  hydrALAZINE (APRESOLINE) tablet 100 mg (100 mg Oral Given 03/27/22 0205)  sodium chloride 0.9 % bolus 500 mL (500 mLs Intravenous Bolus 03/27/22 0405)  carvedilol (COREG) tablet 25 mg (25 mg Oral Given 03/27/22 0404)  ondansetron (ZOFRAN) injection 4 mg (4 mg Intravenous Given 03/27/22 0404)  alum & mag hydroxide-simeth (MAALOX/MYLANTA) 200-200-20 MG/5ML suspension 30 mL (30 mLs Oral Given 03/27/22 0404)  metoCLOPramide (REGLAN) injection 10 mg (10 mg Intravenous Given 03/27/22 0554)    Mobility walks with person assist

## 2022-03-28 DIAGNOSIS — I160 Hypertensive urgency: Secondary | ICD-10-CM

## 2022-03-28 DIAGNOSIS — I10 Essential (primary) hypertension: Secondary | ICD-10-CM

## 2022-03-28 DIAGNOSIS — F1210 Cannabis abuse, uncomplicated: Secondary | ICD-10-CM

## 2022-03-28 DIAGNOSIS — E1169 Type 2 diabetes mellitus with other specified complication: Secondary | ICD-10-CM

## 2022-03-28 DIAGNOSIS — D631 Anemia in chronic kidney disease: Secondary | ICD-10-CM | POA: Insufficient documentation

## 2022-03-28 DIAGNOSIS — E785 Hyperlipidemia, unspecified: Secondary | ICD-10-CM

## 2022-03-28 LAB — CBC
HCT: 31.1 % — ABNORMAL LOW (ref 39.0–52.0)
Hemoglobin: 10.3 g/dL — ABNORMAL LOW (ref 13.0–17.0)
MCH: 28.2 pg (ref 26.0–34.0)
MCHC: 33.1 g/dL (ref 30.0–36.0)
MCV: 85.2 fL (ref 80.0–100.0)
Platelets: 298 10*3/uL (ref 150–400)
RBC: 3.65 MIL/uL — ABNORMAL LOW (ref 4.22–5.81)
RDW: 13.1 % (ref 11.5–15.5)
WBC: 7.7 10*3/uL (ref 4.0–10.5)
nRBC: 0 % (ref 0.0–0.2)

## 2022-03-28 LAB — COMPREHENSIVE METABOLIC PANEL
ALT: 17 U/L (ref 0–44)
AST: 21 U/L (ref 15–41)
Albumin: 3.1 g/dL — ABNORMAL LOW (ref 3.5–5.0)
Alkaline Phosphatase: 91 U/L (ref 38–126)
Anion gap: 6 (ref 5–15)
BUN: 19 mg/dL (ref 6–20)
CO2: 24 mmol/L (ref 22–32)
Calcium: 8.7 mg/dL — ABNORMAL LOW (ref 8.9–10.3)
Chloride: 109 mmol/L (ref 98–111)
Creatinine, Ser: 1.43 mg/dL — ABNORMAL HIGH (ref 0.61–1.24)
GFR, Estimated: >60 mL/min (ref >60)
Glucose, Bld: 109 mg/dL — ABNORMAL HIGH (ref 70–99)
Potassium: 3.5 mmol/L (ref 3.5–5.1)
Sodium: 139 mmol/L (ref 135–145)
Total Bilirubin: 0.7 mg/dL (ref 0.3–1.2)
Total Protein: 7.3 g/dL (ref 6.5–8.1)

## 2022-03-28 LAB — URINE CULTURE: Culture: NO GROWTH

## 2022-03-28 LAB — GLUCOSE, CAPILLARY: Glucose-Capillary: 161 mg/dL — ABNORMAL HIGH (ref 70–99)

## 2022-03-28 MED ORDER — INSULIN ASPART 100 UNIT/ML IJ SOLN: 0.0000 [IU] | Freq: Every day | INTRAMUSCULAR | Status: DC

## 2022-03-28 MED ORDER — INSULIN ASPART 100 UNIT/ML IJ SOLN: 0.0000 [IU] | Freq: Three times a day (TID) | INTRAMUSCULAR | Status: DC

## 2022-03-28 MED ORDER — PANTOPRAZOLE SODIUM 40 MG PO TBEC: 40.0000 mg | DELAYED_RELEASE_TABLET | Freq: Every day | ORAL | Status: DC

## 2022-03-28 MED ORDER — METOCLOPRAMIDE HCL 5 MG PO TABS: 5.0000 mg | ORAL_TABLET | Freq: Three times a day (TID) | ORAL | Status: DC

## 2022-03-28 MED ORDER — ORAL CARE MOUTH RINSE: 15.0000 mL | OROMUCOSAL | Status: DC | PRN

## 2022-03-28 MED ORDER — OXYCODONE HCL 5 MG PO TABS
5.0000 mg | ORAL_TABLET | ORAL | Status: DC | PRN
  Administered 2022-03-28: 5 mg via ORAL
  Filled 2022-03-28: qty 1

## 2022-03-28 MED ORDER — HYDRALAZINE HCL 50 MG PO TABS
100.0000 mg | ORAL_TABLET | Freq: Three times a day (TID) | ORAL | Status: DC
  Administered 2022-03-28: 100 mg via ORAL
  Filled 2022-03-28: qty 2

## 2022-03-28 NOTE — Assessment & Plan Note (Signed)
BP improved - Continue home amlodipine, Coreg - Resume home hydralazine

## 2022-03-28 NOTE — Hospital Course (Signed)
Randy [name] is a 41 y.o. M with HTN, DM, CKD IIIa baseline 1.4-1.7, marijuana use and recurrent episodes vomiting episodes who presented with vomiting and epigastric pain after smoking marijuana.  In the ER, VSS, could not take PO, CT abdomen unremarkable.   11/15: Admitted for "cannabis hyperemesis"

## 2022-03-28 NOTE — Assessment & Plan Note (Signed)
Cr stable relative to baseline 1.4-1.7

## 2022-03-28 NOTE — Assessment & Plan Note (Signed)
Mild asymptomatic - Hold fluids

## 2022-03-28 NOTE — Assessment & Plan Note (Signed)
Glucose controlled, last A1c 7.6%.  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 - Continue glargine - Start SS corrections

## 2022-03-28 NOTE — Assessment & Plan Note (Signed)
Continue PPI ?

## 2022-03-28 NOTE — TOC Progression Note (Addendum)
Transition of Care (TOC) - Progression Note    Patient Details  Name: Randy [name]. MRN: [id number] Date of Birth: 08/06/1980  Transition of Care [facility]) CM/SW Contact  Geni Bers, RN Phone Number: 03/28/2022, 3:34 PM  Clinical Narrative:     Spoke with pt concerning SDOH. Pt states he do not have a problem with food or a place to stay. TOC will follow for medication needs.   Transition of Care [facility]) Screening Note   Patient Details  Name: Randy [name]. Date of Birth: [birth year]   Transition of Care [facility]) CM/SW Contact:    Geni Bers, RN Phone Number: 03/28/2022, 3:34 PM    Transition of Care Department [facility]) has reviewed patient and no TOC needs have been identified at this time. We will continue to monitor patient advancement through interdisciplinary progression rounds. If new patient transition needs arise, please place a TOC consult.         Expected Discharge Plan and Services                                                 Social Determinants of Health (SDOH) Interventions    Readmission Risk Interventions    01/06/2022    1:32 PM 06/20/2021   11:50 AM  Readmission Risk Prevention Plan  Transportation Screening Complete Complete  PCP or Specialist Appt within 3-5 Days Not Complete Complete  Not Complete comments pt will need to call for appointment   HRI or Home Care Consult Complete Complete  Social Work Consult for Recovery Care Planning/Counseling Complete Complete  Palliative Care Screening Not Applicable Not Applicable  Medication Review [facility]) Complete Referral to Pharmacy

## 2022-03-28 NOTE — Assessment & Plan Note (Signed)
See above

## 2022-03-28 NOTE — Assessment & Plan Note (Signed)
Hgb stable 

## 2022-03-28 NOTE — Assessment & Plan Note (Addendum)
There is some diagnostic uncertainty about whether this is gastroparesis, cannabinoid hyperemesis or pancreatitis.    He's now had 7 admissions in the last 2 years for abdominal pain and vomiting.  In the initial few episodes, he had CT findings of "mild pancreatitis", but more recently, his CT or MR abdomen has not showed pancreatitis.    On this admission, he had a nonspecific lipase elevation, normal CT, no pancreatitis or pancreatic calcifications.    Improving today - Stop fluids - Advance diet - Transition to oral opiates - Continue Reglan, orally  - Needs GES as outpatient

## 2022-03-28 NOTE — Assessment & Plan Note (Signed)
-   Resume Lipitor at discharge

## 2022-03-28 NOTE — Progress Notes (Signed)
  Progress Note   Patient: Randy [name]. FGH:[id number] DOB: Mar 07, 1981 DOA: 03/26/2022     0 DOS: the patient was seen and examined on 03/28/2022        Brief hospital course: Randy [name] is a 41 y.o. M with HTN, DM, CKD IIIa baseline 1.4-1.7, marijuana use and recurrent episodes vomiting episodes who presented with vomiting and epigastric pain after smoking marijuana.  In the ER, VSS, could not take PO, CT abdomen unremarkable.   11/15: Admitted for "cannabis hyperemesis"     Assessment and Plan: * Intractable nausea and vomiting There is some diagnostic uncertainty about whether this is gastroparesis, cannabinoid hyperemesis or pancreatitis.    He's now had 7 admissions in the last 2 years for abdominal pain and vomiting.  In the initial few episodes, he had CT findings of "mild pancreatitis", but more recently, his CT or MR abdomen has not showed pancreatitis.    On this admission, he had a nonspecific lipase elevation, normal CT, no pancreatitis or pancreatic calcifications.    Improving today - Stop fluids - Advance diet - Transition to oral opiates - Continue Reglan, orally  - Needs GES as outpatient    Hypertensive urgency BP improved - Continue home amlodipine, Coreg - Resume home hydralazine  Hyperlipidemia associated with type 2 diabetes mellitus (HCC) - Resume Lipitor at discharge  Anemia due to chronic kidney disease Hgb stable  Stage 3a chronic kidney disease (CKD) (HCC) Cr stable relative to baseline 1.4-1.7  Hyponatremia Mild asymptomatic - Hold fluids  GERD (gastroesophageal reflux disease) - Continue PPI  Essential hypertension See above  Cannabis abuse    Type 2 diabetes mellitus with hyperglycemia, with long-term current use of insulin (HCC) Glucose controlled, last A1c 7.6%.  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 - Continue glargine - Start SS corrections          Subjective: Still nauseated but  better, hungry, no vomiting overnight, no fever.      Physical Exam: BP (!) 166/74 (BP Location: Right Arm)   Pulse 78   Temp 98.6 F (37 C) (Oral)   Resp 16   Ht 5\' 8"  (1.727 m)   Wt 84.3 kg   SpO2 99%   BMI 28.25 kg/m   Adult male, lying in bed, appears uncomfortable RRR, no murmurs, no peripheral edema Respiratory rate normal, lungs clear without rales or wheezes Abdomen soft without tenderness palpation or guarding, no ascites or distention Affect blunted, does not open eyes or engage in conversation, moves upper extremities with normal strength and coordination, speech fluent    Data Reviewed: Basic metabolic panel unremarkable CBC unremarkable  Family Communication: None present    Disposition: Status is: Observation         Author: , MD 03/28/2022 2:59 PM  For on call review [email or web address].

## 2022-03-28 NOTE — Discharge Summary (Signed)
Physician Discharge Summary   Patient: Randy [name]. MRN: [id number] DOB: 12/23/1980  Admit date:     03/26/2022  Discharge date: 03/28/22  Discharge Physician: Randy [name]   PCP: Randy Herter, NP     Recommendations at discharge:  Follow up with PCP Randy [name] as soon as able Randy [name]:  Consider gastric emptying study and/or GI referral if able     Discharge Diagnoses: Principal Problem:   Intractable nausea and vomiting, suspect gastroparesis Active Problems:   Hypertensive urgency   Hyperlipidemia associated with type 2 diabetes mellitus ([place])   Type 2 diabetes mellitus with hyperglycemia, with long-term current use of insulin (HCC)   Cannabis abuse   Essential hypertension   GERD (gastroesophageal reflux disease)   Hyponatremia   Stage 3a chronic kidney disease (CKD) ([place])   Anemia due to chronic kidney disease      Hospital Course: Randy [name] is a 41 y.o. M with HTN, DM, CKD IIIa baseline 1.4-1.7, marijuana use and recurrent episodes vomiting episodes who presented with vomiting and epigastric pain after smoking marijuana.  In the ER, VSS, could not take PO, CT abdomen unremarkable.       * Intractable nausea and vomiting There is some diagnostic uncertainty about whether this is gastroparesis, cannabinoid hyperemesis or pancreatitis.    He's now had 7 admissions in the last 2 years for abdominal pain and vomiting.  Back in 2021 and 2022, he had a few CT abdomens with findings of "mild pancreatitis", but more recently, his CT or MR abdomen imaging has not showed pancreatitis.    Further, episodic recurrence is not typical of acute pancreatitis, and he doesn't have pancreatic calcifications or pain between episodes to suggest chronic pancreatitis.  He does note his symptoms are associated with greasy or heavy foods (greasy meals, pigs feet, etc).  They don't seem to be associated with THC or alcohol, although I'm not certain how much he  is avoiding this line of questioning.  On this admission, he had a nonspecific lipase elevation, normal CT, no pancreatitis.  He was treated with fluids, BP medications, opiates, and scheduled Reglan and he improved quickly.  Today, he was able to advance his diet to soft foods, tolerated them well without pain or vomiting, and was ready for discharge home.       Hypertensive urgency Amlodipine, Coreg and hydralazine were resumed. His BP improved.  Anemia due to chronic kidney disease Hgb stable  Stage 3a chronic kidney disease (CKD) (HCC) Cr stable relative to baseline 1.4-1.7  Type 2 diabetes mellitus with hyperglycemia, with long-term current use of insulin (HCC) Glucose controlled, last A1c 7.6%.  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            The [facility] Controlled Substances Registry was reviewed for this patient prior to discharge.   Consultants: None Procedures performed: CT abdomen  Disposition: Home Diet recommendation:  Small meals, avoid greasy heavy foods  DISCHARGE MEDICATION: Allergies as of 03/28/2022       Reactions   Tomato Itching        Medication List     TAKE these medications    Accu-Chek Guide test strip Generic drug: glucose blood Use to check blood sugar 3 times daily.   True Metrix Blood Glucose Test test strip Generic drug: glucose blood USE UP TO FOUR TIMES DAILY AS DIRECTED   amLODipine 5 MG tablet Commonly known as: NORVASC Take 2 tablets (10 mg total)  by mouth daily. What changed: how much to take   atorvastatin 40 MG tablet Commonly known as: LIPITOR Take 2 tablets (80 mg total) by mouth daily. What changed: how much to take   Basaglar KwikPen 100 UNIT/ML Inject 18 Units into the skin daily. What changed: how much to take   carvedilol 25 MG tablet Commonly known as: COREG Take 1 tablet (25 mg total) by mouth 2 (two) times daily with a meal.   hydrALAZINE 100 MG tablet Commonly  known as: APRESOLINE Take 1 tablet (100 mg total) by mouth every 8 (eight) hours. What changed: when to take this   metoCLOPramide 5 MG tablet Commonly known as: REGLAN Take 1 tablet (5 mg total) by mouth 4 (four) times daily -  before meals and at bedtime for 14 days. Please discuss with your PCP whether this needs to be continued   omeprazole 20 MG capsule Commonly known as: PRILOSEC Take 1 capsule (20 mg total) by mouth daily. What changed: how much to take   ondansetron 4 MG disintegrating tablet Commonly known as: ZOFRAN-ODT Take 1 tablet (4 mg total) by mouth every 8 (eight) hours as needed for nausea or vomiting.   True Metrix Meter w/Device Kit Dispense based on patient and insurance preference. Use up to four times daily as directed.   TRUEplus 5-Bevel Pen Needles 32G X 4 MM Misc Generic drug: Insulin Pen Needle Use to inject basaglar once daily.   TRUEplus Lancets 28G Misc USE UP TO 4 TIMES DAILY        Follow-up Information     Randy Herter, NP. Schedule an appointment as soon as possible for a visit .   Specialty: Nurse Practitioner Contact information: [address] [place] [address] [phone]                 Discharge Instructions     Discharge instructions   Complete by: As directed    **IMPORTANT DISCHARGE INSTRUCTIONS**   From Randy [name]: You were admitted for vomiting. Here, we were uncertain if this was gastroparesis, cannabis hyperemesis syndrome or cyclic vomiting syndrome. Because it keeps happening, we don't really think it is pancreatitis anymore.  I recommend you watch for things that trigger your symptoms.  Gastroparesis is a nerve disorder, similar to diabetic retinopathy that causes vision loss, which makes me wonder.    Google gastroparesis, or ask your family to search it online.  If it is greasy foods or rich foods, avoid those, and try to eat smaller meals, spaced out through the day.  Reglan  is a nausea medicine that helps some people with gastroparesis.  To see if you have delayed stomach emptying like is found in people with gastroparesis, ask Randy [name] if you can be referred to a gastroenterologist or if you can get a gastric emptying study with the [employer].  Go see Randy [name] in the office The soonest appointment I could get was 8 weeks out. (See below) They will try to push it sooner if they can, and they will call you   Increase activity slowly   Complete by: As directed        Discharge Exam: Filed Weights   03/27/22 1721  Weight: 84.3 kg    General: Pt is alert, awake, not in acute distress, does not make eye contact, is legally blind Cardiovascular: RRR, nl S1-S2, no murmurs appreciated.   No LE edema.   Respiratory: Normal respiratory rate and rhythm.  CTAB without rales or wheezes. Abdominal: Abdomen soft and non-tender.  No distension or HSM.   Neuro/Psych: Strength symmetric in upper and lower extremities.  Judgment and insight appear normal.   Condition at discharge: Good  The results of significant diagnostics from this hospitalization (including imaging, microbiology, ancillary and laboratory) are listed below for reference.   Imaging Studies: CT ABDOMEN PELVIS W CONTRAST  Result Date: 03/27/2022 CLINICAL DATA:  Acute pancreatitis with abdominal pain for 3 days and history of pancreatitis. EXAM: CT ABDOMEN AND PELVIS WITH CONTRAST TECHNIQUE: Multidetector CT imaging of the abdomen and pelvis was performed using the standard protocol following bolus administration of intravenous contrast. RADIATION DOSE REDUCTION: This exam was performed according to the departmental dose-optimization program which includes automated exposure control, adjustment of the mA and/or kV according to patient size and/or use of iterative reconstruction technique. CONTRAST:  4m OMNIPAQUE IOHEXOL 300 MG/ML  SOLN COMPARISON:  CT with IV contrast 01/03/2022 and 08/01/2021  FINDINGS: Lower chest: No acute abnormality. Hepatobiliary: No focal liver abnormality is seen. No calcified gallstones, gallbladder wall thickening, or biliary dilatation. Pancreas: Unremarkable. No pancreatic ductal dilatation or surrounding inflammatory changes. Spleen: Normal. Adrenals/Urinary Tract: There is no adrenal mass. There is homogeneous bilateral renal cortical enhancement without evidence for masses or pyelonephritis. There is no urinary stone or obstruction. Generalized mild thickening of the bladder versus underdistention is again noted. Stomach/Bowel: No dilatation or wall thickening including the appendix. There are colonic diverticula without evidence of acute colitis or diverticulitis. Vascular/Lymphatic: Unremarkable abdominal aorta. Trace calcification in the common iliac arteries. No other significant vascular findings. No adenopathy is seen. Reproductive: There is mild prostatomegaly. Calcifications in the bilateral vasa deferentia are again shown. Other: There is no incarcerated hernia. There is no free air, free hemorrhage or free fluid. Musculoskeletal: No acute or significant osseous findings. IMPRESSION: 1. No acute abdominal or pelvic abnormality is seen. 2. No CT findings of acute pancreatitis. 3. Diverticulosis without evidence of diverticulitis. 4. Prostatomegaly. 5. Generalized bladder thickening versus nondistention. Correlate clinically for cystitis or bladder hypertrophy. Electronically Signed   By: KTelford NabM.D.   On: 03/27/2022 00:29    Microbiology: Results for orders placed or performed during the hospital encounter of 03/26/22  Urine Culture     Status: None   Collection Time: 03/27/22  6:37 AM   Specimen: Urine, Clean Catch  Result Value Ref Range Status   Specimen Description   Final    URINE, CLEAN CATCH [facility]formed at [facility] [address] [place] [id number]   Special Requests   Final    NONE [facility]formed at [facility] [address], [place] [address]   Culture   Final    NO GROWTH Performed at [facility] [address] [place]  [id number]   Report Status 03/28/2022 FINAL  Final    Labs: CBC: Recent Labs  Lab 03/26/22 2300 03/27/22 0926 03/28/22 0357  WBC 9.1 10.2 7.7  NEUTROABS 5.9  --   --   HGB 9.8* 10.5* 10.3*  HCT 30.2* 32.2* 31.1*  MCV 84.8 84.7 85.2  PLT 292 307 2629  Basic Metabolic Panel: Recent Labs  Lab 03/26/22 2300 03/27/22 0926 03/28/22 0357  NA 134* 133* 139  K 3.9 4.0 3.5  CL 103 102 109  CO2 _0 GLUCOSE 120* 207* 109*  BUN 30* 23* 19  CREATININE 1.52* 1.43* 1.43*  CALCIUM 8.4*  8.5* 8.7*  MG  --  2.0  --    Liver Function Tests: Recent Labs  Lab 03/26/22 2300 03/27/22 0926 03/28/22 0357  AST _0 ALT _1 ALKPHOS 111 105 91  BILITOT 0.6 0.8 0.7  PROT 7.6 7.9 7.3  ALBUMIN 3.4* 3.6 3.1*   CBG: Recent Labs  Lab 03/27/22 1202 03/27/22 1836 03/27/22 2123 03/27/22 2338 03/28/22 1133  GLUCAP 187* 137* 145* 120* 161*    Discharge time spent: approximately 35 minutes spent on discharge counseling, evaluation of patient on day of discharge, and coordination of discharge planning with nursing, social work, pharmacy and case management  Signed: Edwin Dada, MD [facility] 03/28/2022

## 2022-03-29 ENCOUNTER — Telehealth: Payer: Self-pay

## 2022-03-29 NOTE — Telephone Encounter (Signed)
Copied from CRM [id number]. Topic: Appointment Scheduling - Scheduling Inquiry for Clinic >> Mar 28, 2022  4:29 PM Marlow Baars wrote: Reason for CRM: Dr Maryfrances Bunnell called from [facility] to make a hospital follow up for the patient. The patient has been scheduled for his providers first available appt and has been put on the wait list. The providers do prefer for the patients to be seen the closest to 7-10 days from date of discharge so please assist further.  Att to contact pt to advise that appt scheduled for 05/21/22 has been ,cancelled and rescheduled to 04/02/22,called phone [phone], no ans woman voice on vm and name does not match up with patient contact on patient file
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
## 2022-04-02 ENCOUNTER — Encounter: Payer: Self-pay | Admitting: Family

## 2022-04-02 DIAGNOSIS — Z09 Encounter for follow-up examination after completed treatment for conditions other than malignant neoplasm: Secondary | ICD-10-CM

## 2022-04-02 DIAGNOSIS — F1210 Cannabis abuse, uncomplicated: Secondary | ICD-10-CM

## 2022-04-02 DIAGNOSIS — Z794 Long term (current) use of insulin: Secondary | ICD-10-CM

## 2022-04-02 DIAGNOSIS — N1831 Chronic kidney disease, stage 3a: Secondary | ICD-10-CM

## 2022-04-02 DIAGNOSIS — K219 Gastro-esophageal reflux disease without esophagitis: Secondary | ICD-10-CM

## 2022-04-02 DIAGNOSIS — I10 Essential (primary) hypertension: Secondary | ICD-10-CM

## 2022-04-02 DIAGNOSIS — R112 Nausea with vomiting, unspecified: Secondary | ICD-10-CM

## 2022-04-02 DIAGNOSIS — E1169 Type 2 diabetes mellitus with other specified complication: Secondary | ICD-10-CM

## 2022-04-02 DIAGNOSIS — E871 Hypo-osmolality and hyponatremia: Secondary | ICD-10-CM

## 2022-04-02 DIAGNOSIS — D631 Anemia in chronic kidney disease: Secondary | ICD-10-CM

## 2022-04-02 DIAGNOSIS — I160 Hypertensive urgency: Secondary | ICD-10-CM

## 2022-04-03 ENCOUNTER — Other Ambulatory Visit: Payer: Self-pay

## 2022-04-10 ENCOUNTER — Other Ambulatory Visit: Payer: Self-pay

## 2022-05-02 ENCOUNTER — Other Ambulatory Visit: Payer: Self-pay

## 2022-05-10 ENCOUNTER — Emergency Department (HOSPITAL_COMMUNITY): Payer: Self-pay

## 2022-05-10 ENCOUNTER — Other Ambulatory Visit: Payer: Self-pay

## 2022-05-10 ENCOUNTER — Encounter (HOSPITAL_COMMUNITY): Payer: Self-pay

## 2022-05-10 ENCOUNTER — Inpatient Hospital Stay (HOSPITAL_COMMUNITY)
Admission: EM | Admit: 2022-05-10 | Discharge: 2022-05-12 | DRG: 304 | Disposition: A | Discharge status: Home / Self Care | Payer: Self-pay | Attending: Internal Medicine | Admitting: Internal Medicine

## 2022-05-10 DIAGNOSIS — R109 Unspecified abdominal pain: Principal | ICD-10-CM | POA: Diagnosis present

## 2022-05-10 DIAGNOSIS — I160 Hypertensive urgency: Principal | ICD-10-CM | POA: Diagnosis present

## 2022-05-10 DIAGNOSIS — E1169 Type 2 diabetes mellitus with other specified complication: Secondary | ICD-10-CM

## 2022-05-10 DIAGNOSIS — I129 Hypertensive chronic kidney disease with stage 1 through stage 4 chronic kidney disease, or unspecified chronic kidney disease: Secondary | ICD-10-CM | POA: Diagnosis present

## 2022-05-10 DIAGNOSIS — K861 Other chronic pancreatitis: Secondary | ICD-10-CM | POA: Diagnosis present

## 2022-05-10 DIAGNOSIS — Z794 Long term (current) use of insulin: Secondary | ICD-10-CM

## 2022-05-10 DIAGNOSIS — Z8249 Family history of ischemic heart disease and other diseases of the circulatory system: Secondary | ICD-10-CM

## 2022-05-10 DIAGNOSIS — K859 Acute pancreatitis without necrosis or infection, unspecified: Secondary | ICD-10-CM | POA: Diagnosis present

## 2022-05-10 DIAGNOSIS — I10 Essential (primary) hypertension: Secondary | ICD-10-CM

## 2022-05-10 DIAGNOSIS — Z91199 Patient's noncompliance with other medical treatment and regimen due to unspecified reason: Secondary | ICD-10-CM

## 2022-05-10 DIAGNOSIS — K2970 Gastritis, unspecified, without bleeding: Secondary | ICD-10-CM | POA: Diagnosis present

## 2022-05-10 DIAGNOSIS — K5790 Diverticulosis of intestine, part unspecified, without perforation or abscess without bleeding: Secondary | ICD-10-CM | POA: Diagnosis present

## 2022-05-10 DIAGNOSIS — T465X6A Underdosing of other antihypertensive drugs, initial encounter: Secondary | ICD-10-CM | POA: Diagnosis present

## 2022-05-10 DIAGNOSIS — Z91018 Allergy to other foods: Secondary | ICD-10-CM

## 2022-05-10 DIAGNOSIS — E861 Hypovolemia: Secondary | ICD-10-CM | POA: Diagnosis present

## 2022-05-10 DIAGNOSIS — E1165 Type 2 diabetes mellitus with hyperglycemia: Secondary | ICD-10-CM

## 2022-05-10 DIAGNOSIS — E1122 Type 2 diabetes mellitus with diabetic chronic kidney disease: Secondary | ICD-10-CM | POA: Diagnosis present

## 2022-05-10 DIAGNOSIS — K219 Gastro-esophageal reflux disease without esophagitis: Secondary | ICD-10-CM | POA: Diagnosis present

## 2022-05-10 DIAGNOSIS — Z833 Family history of diabetes mellitus: Secondary | ICD-10-CM

## 2022-05-10 DIAGNOSIS — E871 Hypo-osmolality and hyponatremia: Secondary | ICD-10-CM | POA: Diagnosis present

## 2022-05-10 DIAGNOSIS — K2100 Gastro-esophageal reflux disease with esophagitis, without bleeding: Secondary | ICD-10-CM | POA: Diagnosis present

## 2022-05-10 DIAGNOSIS — E785 Hyperlipidemia, unspecified: Secondary | ICD-10-CM | POA: Diagnosis present

## 2022-05-10 DIAGNOSIS — Z87891 Personal history of nicotine dependence: Secondary | ICD-10-CM

## 2022-05-10 DIAGNOSIS — Z79899 Other long term (current) drug therapy: Secondary | ICD-10-CM

## 2022-05-10 DIAGNOSIS — R112 Nausea with vomiting, unspecified: Secondary | ICD-10-CM | POA: Diagnosis present

## 2022-05-10 DIAGNOSIS — N1831 Chronic kidney disease, stage 3a: Secondary | ICD-10-CM | POA: Diagnosis present

## 2022-05-10 LAB — COMPREHENSIVE METABOLIC PANEL
ALT: 21 U/L (ref 0–44)
AST: 24 U/L (ref 15–41)
Albumin: 3.7 g/dL (ref 3.5–5.0)
Alkaline Phosphatase: 111 U/L (ref 38–126)
Anion gap: 9 (ref 5–15)
BUN: 23 mg/dL — ABNORMAL HIGH (ref 6–20)
CO2: 24 mmol/L (ref 22–32)
Calcium: 9.1 mg/dL (ref 8.9–10.3)
Chloride: 98 mmol/L (ref 98–111)
Creatinine, Ser: 1.72 mg/dL — ABNORMAL HIGH (ref 0.61–1.24)
GFR, Estimated: 50 mL/min — ABNORMAL LOW (ref >60)
Glucose, Bld: 336 mg/dL — ABNORMAL HIGH (ref 70–99)
Potassium: 5 mmol/L (ref 3.5–5.1)
Sodium: 131 mmol/L — ABNORMAL LOW (ref 135–145)
Total Bilirubin: 0.8 mg/dL (ref 0.3–1.2)
Total Protein: 8.7 g/dL — ABNORMAL HIGH (ref 6.5–8.1)

## 2022-05-10 LAB — CBC WITH DIFFERENTIAL/PLATELET
Abs Immature Granulocytes: 0.03 10*3/uL (ref 0.00–0.07)
Basophils Absolute: 0.1 10*3/uL (ref 0.0–0.1)
Basophils Relative: 1 %
Eosinophils Absolute: 0.1 10*3/uL (ref 0.0–0.5)
Eosinophils Relative: 1 %
HCT: 37.3 % — ABNORMAL LOW (ref 39.0–52.0)
Hemoglobin: 12.2 g/dL — ABNORMAL LOW (ref 13.0–17.0)
Immature Granulocytes: 0 %
Lymphocytes Relative: 21 %
Lymphs Abs: 1.7 10*3/uL (ref 0.7–4.0)
MCH: 27.2 pg (ref 26.0–34.0)
MCHC: 32.7 g/dL (ref 30.0–36.0)
MCV: 83.1 fL (ref 80.0–100.0)
Monocytes Absolute: 0.4 10*3/uL (ref 0.1–1.0)
Monocytes Relative: 4 %
Neutro Abs: 5.9 10*3/uL (ref 1.7–7.7)
Neutrophils Relative %: 73 %
Platelets: 332 10*3/uL (ref 150–400)
RBC: 4.49 MIL/uL (ref 4.22–5.81)
RDW: 12.1 % (ref 11.5–15.5)
WBC: 8.1 10*3/uL (ref 4.0–10.5)
nRBC: 0 % (ref 0.0–0.2)

## 2022-05-10 LAB — URINALYSIS, ROUTINE W REFLEX MICROSCOPIC
Bacteria, UA: NONE SEEN
Bilirubin Urine: NEGATIVE
Glucose, UA: >=500 mg/dL — AB
Hgb urine dipstick: NEGATIVE
Ketones, ur: NEGATIVE mg/dL
Leukocytes,Ua: NEGATIVE
Nitrite: NEGATIVE
Protein, ur: >=300 mg/dL — AB
Specific Gravity, Urine: 1.02 (ref 1.005–1.030)
pH: 8 (ref 5.0–8.0)

## 2022-05-10 LAB — GLUCOSE, CAPILLARY: Glucose-Capillary: 306 mg/dL — ABNORMAL HIGH (ref 70–99)

## 2022-05-10 LAB — MRSA NEXT GEN BY PCR, NASAL: MRSA by PCR Next Gen: NOT DETECTED

## 2022-05-10 LAB — LIPASE, BLOOD: Lipase: 84 U/L — ABNORMAL HIGH (ref 11–51)

## 2022-05-10 MED ORDER — CHLORHEXIDINE GLUCONATE CLOTH 2 % EX PADS
6.0000 | MEDICATED_PAD | Freq: Every day | CUTANEOUS | Status: DC
  Administered 2022-05-11: 6 via TOPICAL

## 2022-05-10 MED ORDER — ENOXAPARIN SODIUM 40 MG/0.4ML IJ SOSY
40.0000 mg | PREFILLED_SYRINGE | Freq: Every day | INTRAMUSCULAR | Status: DC
  Administered 2022-05-10 – 2022-05-11 (×2): 40 mg via SUBCUTANEOUS
  Filled 2022-05-10 (×2): qty 0.4

## 2022-05-10 MED ORDER — FAMOTIDINE IN NACL 20-0.9 MG/50ML-% IV SOLN
20.0000 mg | Freq: Once | INTRAVENOUS | Status: AC
  Administered 2022-05-10: 20 mg via INTRAVENOUS
  Filled 2022-05-10: qty 50

## 2022-05-10 MED ORDER — DROPERIDOL 2.5 MG/ML IJ SOLN
2.5000 mg | Freq: Once | INTRAMUSCULAR | Status: AC
  Administered 2022-05-10: 2.5 mg via INTRAMUSCULAR
  Filled 2022-05-10: qty 2

## 2022-05-10 MED ORDER — ONDANSETRON 4 MG PO TBDP
4.0000 mg | ORAL_TABLET | Freq: Once | ORAL | Status: AC | PRN
  Administered 2022-05-10: 4 mg via ORAL
  Filled 2022-05-10: qty 1

## 2022-05-10 MED ORDER — KETOROLAC TROMETHAMINE 15 MG/ML IJ SOLN: 15.0000 mg | Freq: Once | INTRAMUSCULAR | Status: DC

## 2022-05-10 MED ORDER — HYDRALAZINE HCL 20 MG/ML IJ SOLN
20.0000 mg | Freq: Once | INTRAMUSCULAR | Status: AC
  Administered 2022-05-10: 20 mg via INTRAVENOUS
  Filled 2022-05-10: qty 1

## 2022-05-10 MED ORDER — SODIUM CHLORIDE 0.9 % IV BOLUS
1000.0000 mL | Freq: Once | INTRAVENOUS | Status: AC
  Administered 2022-05-10: 1000 mL via INTRAVENOUS

## 2022-05-10 MED ORDER — PANTOPRAZOLE SODIUM 40 MG IV SOLR: 40.0000 mg | INTRAVENOUS | Status: DC

## 2022-05-10 MED ORDER — CHLORHEXIDINE GLUCONATE CLOTH 2 % EX PADS
6.0000 | MEDICATED_PAD | Freq: Every day | CUTANEOUS | Status: DC
  Administered 2022-05-10: 6 via TOPICAL

## 2022-05-10 MED ORDER — ONDANSETRON HCL 4 MG/2ML IJ SOLN
4.0000 mg | Freq: Once | INTRAMUSCULAR | Status: AC
  Administered 2022-05-10: 4 mg via INTRAVENOUS
  Filled 2022-05-10: qty 2

## 2022-05-10 MED ORDER — IOHEXOL 300 MG/ML  SOLN
100.0000 mL | Freq: Once | INTRAMUSCULAR | Status: AC | PRN
  Administered 2022-05-10: 100 mL via INTRAVENOUS

## 2022-05-10 MED ORDER — INSULIN ASPART 100 UNIT/ML IJ SOLN
0.0000 [IU] | Freq: Every day | INTRAMUSCULAR | Status: DC
  Administered 2022-05-10: 4 [IU] via SUBCUTANEOUS
  Filled 2022-05-10: qty 0.05

## 2022-05-10 MED ORDER — HYDROMORPHONE HCL 1 MG/ML IJ SOLN
0.5000 mg | Freq: Once | INTRAMUSCULAR | Status: AC
  Administered 2022-05-10: 0.5 mg via INTRAVENOUS
  Filled 2022-05-10: qty 1

## 2022-05-10 MED ORDER — INSULIN ASPART 100 UNIT/ML IJ SOLN
0.0000 [IU] | Freq: Three times a day (TID) | INTRAMUSCULAR | Status: DC
  Administered 2022-05-11: 3 [IU] via SUBCUTANEOUS
  Administered 2022-05-11: 2 [IU] via SUBCUTANEOUS
  Administered 2022-05-11: 8 [IU] via SUBCUTANEOUS
  Administered 2022-05-12: 2 [IU] via SUBCUTANEOUS
  Administered 2022-05-12: 3 [IU] via SUBCUTANEOUS
  Filled 2022-05-10: qty 0.15

## 2022-05-10 MED ORDER — HYDROMORPHONE HCL 1 MG/ML IJ SOLN
0.5000 mg | INTRAMUSCULAR | Status: DC | PRN
  Administered 2022-05-10 – 2022-05-11 (×3): 0.5 mg via INTRAVENOUS
  Filled 2022-05-10 (×4): qty 1

## 2022-05-10 MED ORDER — PANTOPRAZOLE SODIUM 40 MG IV SOLR
40.0000 mg | Freq: Once | INTRAVENOUS | Status: AC
  Administered 2022-05-10: 40 mg via INTRAVENOUS
  Filled 2022-05-10: qty 10

## 2022-05-10 MED ORDER — SUCRALFATE 1 G PO TABS
1.0000 g | ORAL_TABLET | Freq: Once | ORAL | Status: AC
  Administered 2022-05-10: 1 g via ORAL
  Filled 2022-05-10: qty 1

## 2022-05-10 MED ORDER — CARVEDILOL 12.5 MG PO TABS
25.0000 mg | ORAL_TABLET | Freq: Once | ORAL | Status: AC
  Administered 2022-05-10: 25 mg via ORAL
  Filled 2022-05-10: qty 2

## 2022-05-10 MED ORDER — NICARDIPINE HCL IN NACL 20-0.86 MG/200ML-% IV SOLN
3.0000 mg/h | INTRAVENOUS | Status: DC
  Administered 2022-05-10: 5 mg/h via INTRAVENOUS
  Administered 2022-05-10: 3 mg/h via INTRAVENOUS
  Administered 2022-05-10 – 2022-05-11 (×2): 10.5 mg/h via INTRAVENOUS
  Filled 2022-05-10 (×6): qty 200

## 2022-05-10 MED ORDER — ORAL CARE MOUTH RINSE: 15.0000 mL | OROMUCOSAL | Status: DC | PRN

## 2022-05-10 MED ORDER — HYDRALAZINE HCL 20 MG/ML IJ SOLN
5.0000 mg | Freq: Once | INTRAMUSCULAR | Status: AC
  Administered 2022-05-10: 5 mg via INTRAVENOUS
  Filled 2022-05-10: qty 1

## 2022-05-10 MED ORDER — PROCHLORPERAZINE EDISYLATE 10 MG/2ML IJ SOLN
10.0000 mg | Freq: Four times a day (QID) | INTRAMUSCULAR | Status: DC | PRN
  Administered 2022-05-10: 10 mg via INTRAVENOUS
  Filled 2022-05-10: qty 2

## 2022-05-10 MED ORDER — AMLODIPINE BESYLATE 5 MG PO TABS
5.0000 mg | ORAL_TABLET | Freq: Once | ORAL | Status: AC
  Administered 2022-05-10: 5 mg via ORAL
  Filled 2022-05-10: qty 1

## 2022-05-10 MED ORDER — ALUM & MAG HYDROXIDE-SIMETH 200-200-20 MG/5ML PO SUSP
15.0000 mL | Freq: Once | ORAL | Status: AC
  Administered 2022-05-10: 15 mL via ORAL
  Filled 2022-05-10: qty 30

## 2022-05-10 MED ORDER — SODIUM CHLORIDE 0.9 % IV SOLN: INTRAVENOUS | Status: DC

## 2022-05-10 NOTE — ED Notes (Signed)
Pt sleeping. Pt BP 204/108. Dr. Wallace Cullens aware.

## 2022-05-10 NOTE — ED Provider Notes (Signed)
[facility] DEPT Provider Note  CSN: [id number] Arrival date & time: 05/10/22 0227  Chief Complaint(s) Abdominal Pain  HPI Sue Mcalexander. is a 41 y.o. male with past medical history as below, significant for HLD, recurrent pancreatitis, T2DM, THC use who presents to the ED with complaint of nausea, vomiting, epigastric pain. Pt reports pain began last night after eating some fried food. Is mildly improved overnight while in the lobby. No vomiting in the last 3 hours or so. No fevers or chills, no BRBPR or melena. Similar presentation last month that required admission. Variable compliance with home medications, not taking his bp medications. Denies recent THC usage. Reports he has not f/u with GI yet since prior admission, plans to f/u in the next few weeks   Past Medical History Past Medical History:  Diagnosis Date   Essential hypertension 05/31/2019   HLD (hyperlipidemia)    Pancreatitis    Type II diabetes mellitus ([place])    Patient Active Problem List   Diagnosis Date Noted   Anemia due to chronic kidney disease 03/28/2022   Stage 3a chronic kidney disease (CKD) ([place]) 03/27/2022   Obesity (BMI 30-39.9) 01/06/2022   Hyponatremia 01/04/2022   Acute on chronic pancreatitis ([place]) 01/04/2022   Abdominal pain 09/12/2021   Fall at home, initial encounter 06/18/2021   Acute pancreatitis 09/17/2020   Abnormal urinalysis 09/17/2020   Intractable nausea and vomiting 09/17/2020   Hypertensive crisis 09/12/2020   AKI (acute kidney injury) ([place]) 09/12/2020   GERD (gastroesophageal reflux disease) 09/12/2020   Type 2 diabetes mellitus without complication, with long-term current use of insulin (HCC)    HLD (hyperlipidemia) 06/01/2019   Microalbuminuria due to type 2 diabetes mellitus ([place]) 06/01/2019   Essential hypertension 05/31/2019   Hypertensive urgency 08/31/2015   Type 2 diabetes mellitus with hyperglycemia, with long-term current use of insulin  ([place]) 08/31/2015   Cannabis abuse 08/31/2015   Home Medication(s) Prior to Admission medications   Medication Sig Start Date End Date Taking? Authorizing Provider  amLODipine (NORVASC) 5 MG tablet Take 2 tablets (10 mg total) by mouth daily. Patient taking differently: Take 5-10 mg by mouth daily. 01/06/22 03/27/22  Elodia Florence., MD  atorvastatin (LIPITOR) 40 MG tablet Take 2 tablets (80 mg total) by mouth daily. Patient taking differently: Take 40-80 mg by mouth daily. 01/06/22 05/06/22  Elodia Florence., MD  blood glucose meter kit and supplies KIT Dispense based on patient and insurance preference. Use up to four times daily as directed. 01/06/22   Elodia Florence., MD  carvedilol (COREG) 25 MG tablet Take 1 tablet (25 mg total) by mouth 2 (two) times daily with a meal. 01/06/22 05/06/22  Elodia Florence., MD  glucose blood (ACCU-CHEK GUIDE) test strip Use to check blood sugar 3 times daily. 10/15/21   Charlott Rakes, MD  glucose blood test strip USE UP TO FOUR TIMES DAILY AS DIRECTED 01/06/22   Elodia Florence., MD  hydrALAZINE (APRESOLINE) 100 MG tablet Take 1 tablet (100 mg total) by mouth every 8 (eight) hours. Patient taking differently: Take 100 mg by mouth 2 (two) times daily. 01/06/22 03/07/22  Elodia Florence., MD  Insulin Glargine [facility]) 100 UNIT/ML Inject 18 Units into the skin daily. Patient taking differently: Inject 35 Units into the skin daily. 01/06/22 05/18/22  Elodia Florence., MD  Insulin Pen Needle (TRUEPLUS 5-BEVEL PEN NEEDLES) 32G X 4 MM MISC Use to inject  basaglar once daily. 01/30/22   Camillia Herter, NP  metoCLOPramide (REGLAN) 5 MG tablet Take 1 tablet (5 mg total) by mouth 4 (four) times daily -  before meals and at bedtime for 14 days. Please discuss with your PCP whether this needs to be continued Patient not taking: Reported on 03/27/2022 01/06/22 05/18/22  Elodia Florence., MD  omeprazole (PRILOSEC) 20 MG  capsule Take 1 capsule (20 mg total) by mouth daily. 03/27/22   Quintella Reichert, MD  ondansetron (ZOFRAN-ODT) 4 MG disintegrating tablet Take 1 tablet (4 mg total) by mouth every 8 (eight) hours as needed for nausea or vomiting. 03/27/22   Quintella Reichert, MD  TRUEplus Lancets 28G MISC USE UP TO 4 TIMES DAILY 01/06/22   Elodia Florence., MD                                                                                                                                    Past Surgical History Past Surgical History:  Procedure Laterality Date   NO PAST SURGERIES     Family History Family History  Problem Relation Age of Onset   Hypertension Mother    Diabetes Father    Hypertension Father    Diabetes Brother    Diabetes Maternal Grandmother    Colon cancer Neg Hx    Esophageal cancer Neg Hx    Rectal cancer Neg Hx    Stomach cancer Neg Hx     Social History Social History   Tobacco Use   Smoking status: Former    Packs/day: 0.33    Years: 17.00    Total pack years: 5.61    Types: Cigarettes    Quit date: 12/26/2011    Years since quitting: 10.3    Passive exposure: Past   Smokeless tobacco: Never  Vaping Use   Vaping Use: Never used  Substance Use Topics   Alcohol use: No    Comment: no alcohol since age 65   Drug use: Yes    Types: Marijuana    Comment: regular use   Allergies Tomato  Review of Systems Review of Systems  Constitutional:  Negative for chills and fever.  HENT:  Negative for facial swelling and trouble swallowing.   Eyes:  Negative for photophobia and visual disturbance.  Respiratory:  Negative for cough and shortness of breath.   Cardiovascular:  Negative for chest pain and palpitations.  Gastrointestinal:  Positive for abdominal pain, nausea and vomiting.  Endocrine: Negative for polydipsia and polyuria.  Genitourinary:  Negative for difficulty urinating and hematuria.  Musculoskeletal:  Negative for gait problem and joint swelling.  Skin:   Negative for pallor and rash.  Neurological:  Negative for syncope and headaches.  Psychiatric/Behavioral:  Negative for agitation and confusion.     Physical Exam Vital Signs  I have reviewed the triage vital signs BP (!) 157/85   Pulse (!) 103  Temp 98.1 F (36.7 C) (Oral)   Resp 11   SpO2 96%  Physical Exam Vitals and nursing note reviewed.  Constitutional:      General: He is not in acute distress.    Appearance: He is well-developed. He is not ill-appearing.  HENT:     Head: Normocephalic and atraumatic.     Right Ear: External ear normal.     Left Ear: External ear normal.     Mouth/Throat:     Mouth: Mucous membranes are moist.  Eyes:     General: No scleral icterus. Cardiovascular:     Rate and Rhythm: Normal rate and regular rhythm.     Pulses: Normal pulses.     Heart sounds: Normal heart sounds.  Pulmonary:     Effort: Pulmonary effort is normal. No respiratory distress.     Breath sounds: Normal breath sounds.  Abdominal:     General: Abdomen is flat.     Palpations: Abdomen is soft.     Tenderness: There is abdominal tenderness in the epigastric area. There is no guarding or rebound.  Musculoskeletal:        General: Normal range of motion.     Cervical back: Normal range of motion.     Right lower leg: No edema.     Left lower leg: No edema.  Skin:    General: Skin is warm and dry.     Capillary Refill: Capillary refill takes less than 2 seconds.  Neurological:     Mental Status: He is alert and oriented to person, place, and time.  Psychiatric:        Mood and Affect: Mood normal.        Behavior: Behavior normal.     ED Results and Treatments Labs (all labs ordered are listed, but only abnormal results are displayed) Labs Reviewed  LIPASE, BLOOD - Abnormal; Notable for the following components:      Result Value   Lipase 84 (*)    All other components within normal limits  COMPREHENSIVE METABOLIC PANEL - Abnormal; Notable for the  following components:   Sodium 131 (*)    Glucose, Bld 336 (*)    BUN 23 (*)    Creatinine, Ser 1.72 (*)    Total Protein 8.7 (*)    GFR, Estimated 50 (*)    All other components within normal limits  URINALYSIS, ROUTINE W REFLEX MICROSCOPIC - Abnormal; Notable for the following components:   Glucose, UA >=500 (*)    Protein, ur >=300 (*)    All other components within normal limits  CBC WITH DIFFERENTIAL/PLATELET - Abnormal; Notable for the following components:   Hemoglobin 12.2 (*)    HCT 37.3 (*)    All other components within normal limits  HEMOGLOBIN A1C  COMPREHENSIVE METABOLIC PANEL  CBC  Radiology CT ABDOMEN PELVIS W CONTRAST  Result Date: 05/10/2022 CLINICAL DATA:  24 hours of lower abdominal pain and vomiting. EXAM: CT ABDOMEN AND PELVIS WITH CONTRAST TECHNIQUE: Multidetector CT imaging of the abdomen and pelvis was performed using the standard protocol following bolus administration of intravenous contrast. RADIATION DOSE REDUCTION: This exam was performed according to the departmental dose-optimization program which includes automated exposure control, adjustment of the mA and/or kV according to patient size and/or use of iterative reconstruction technique. CONTRAST:  191m OMNIPAQUE IOHEXOL 300 MG/ML  SOLN COMPARISON:  Multiple priors including CT March 27, 2022 FINDINGS: Lower chest: No acute abnormality. Similar mild symmetric wall thickening at the gastroesophageal junction. Hepatobiliary: No suspicious hepatic lesion. Probable adenomyomatosis of the gallbladder fundus. No evidence of gallbladder inflammation. No biliary ductal dilation. Pancreas: No pancreatic ductal dilation or evidence of acute inflammation. Spleen: No splenomegaly or focal splenic lesion. Adrenals/Urinary Tract: Bilateral adrenal glands appear normal. No hydronephrosis. Kidneys  demonstrate symmetric enhancement. Urinary bladder is unremarkable for degree of distension. Stomach/Bowel: Wall thickening versus underdistention of the gastric cardia and antrum. No pathologic dilation of small or large bowel. The appendix and terminal ileum appear normal. Left-sided diverticulosis without findings of acute diverticulitis. Vascular/Lymphatic: Normal caliber abdominal aorta. No pathologically enlarged abdominal or pelvic lymph nodes. Reproductive: Enlarged prostate gland. Other: No significant abdominopelvic free fluid. Musculoskeletal: No acute osseous abnormality. IMPRESSION: 1. Wall thickening versus underdistention of the gastric cardia and antrum. Correlate for gastritis. 2. Mild symmetric wall thickening of the gastroesophageal junction may reflect sequela of emesis or esophagitis. 3. Left-sided diverticulosis without findings of acute diverticulitis. 4. Enlarged prostate gland. Electronically Signed   By: JDahlia BailiffM.D.   On: 05/10/2022 12:59    Pertinent labs & imaging results that were available during my care of the patient were reviewed by me and considered in my medical decision making (see MDM for details).  Medications Ordered in ED Medications  nicardipine (CARDENE) 213min 0.86% saline 2003mV infusion (0.1 mg/ml) (3 mg/hr Intravenous Rate/Dose Change 05/10/22 1758)  prochlorperazine (COMPAZINE) injection 10 mg (has no administration in time range)  pantoprazole (PROTONIX) injection 40 mg (has no administration in time range)  insulin aspart (novoLOG) injection 0-15 Units (has no administration in time range)  insulin aspart (novoLOG) injection 0-5 Units (has no administration in time range)  enoxaparin (LOVENOX) injection 40 mg (has no administration in time range)  0.9 %  sodium chloride infusion (has no administration in time range)  HYDROmorphone (DILAUDID) injection 0.5 mg (has no administration in time range)  ondansetron (ZOFRAN-ODT) disintegrating tablet  4 mg (4 mg Oral Given 05/10/22 0326)  sodium chloride 0.9 % bolus 1,000 mL (0 mLs Intravenous Stopped 05/10/22 1452)  famotidine (PEPCID) IVPB 20 mg premix (0 mg Intravenous Stopped 05/10/22 1322)  ondansetron (ZOFRAN) injection 4 mg (4 mg Intravenous Given 05/10/22 1223)  alum & mag hydroxide-simeth (MAALOX/MYLANTA) 200-200-20 MG/5ML suspension 15 mL (15 mLs Oral Given 05/10/22 1223)  HYDROmorphone (DILAUDID) injection 0.5 mg (0.5 mg Intravenous Given 05/10/22 1223)  amLODipine (NORVASC) tablet 5 mg (5 mg Oral Given 05/10/22 1256)  carvedilol (COREG) tablet 25 mg (25 mg Oral Given 05/10/22 1257)  hydrALAZINE (APRESOLINE) injection 5 mg (5 mg Intravenous Given 05/10/22 1257)  iohexol (OMNIPAQUE) 300 MG/ML solution 100 mL (100 mLs Intravenous Contrast Given 05/10/22 1252)  hydrALAZINE (APRESOLINE) injection 5 mg (5 mg Intravenous Given 05/10/22 1327)  HYDROmorphone (DILAUDID) injection 0.5 mg (0.5 mg Intravenous Given 05/10/22 1328)  sucralfate (CARAFATE) tablet 1 g (  1 g Oral Given 05/10/22 1451)  pantoprazole (PROTONIX) injection 40 mg (40 mg Intravenous Given 05/10/22 1451)  droperidol (INAPSINE) 2.5 MG/ML injection 2.5 mg (2.5 mg Intramuscular Given 05/10/22 1533)  hydrALAZINE (APRESOLINE) injection 20 mg (20 mg Intravenous Given 05/10/22 1624)  HYDROmorphone (DILAUDID) injection 0.5 mg (0.5 mg Intravenous Given 05/10/22 1624)                                                                                                                                     Procedures .Critical Care  Performed by: Jeanell Sparrow, DO Authorized by: Jeanell Sparrow, DO   Critical care provider statement:    Critical care time (minutes):  30   Critical care time was exclusive of:  Separately billable procedures and treating other patients   Critical care was necessary to treat or prevent imminent or life-threatening deterioration of the following conditions:  Circulatory failure   Critical care was time  spent personally by me on the following activities:  Development of treatment plan with patient or surrogate, discussions with consultants, evaluation of patient's response to treatment, examination of patient, ordering and review of laboratory studies, ordering and review of radiographic studies, ordering and performing treatments and interventions, pulse oximetry, re-evaluation of patient's condition, review of old charts and obtaining history from patient or surrogate   Care discussed with: admitting provider     (including critical care time)  Medical Decision Making / ED Course   MDM:  Sonda Rumble. is a 41 y.o. male with past medical history as above, significant for CKD, DM2, recurrent pancreatitis which further complicates the presenting complaint. Pt presents to the ED with complaint of epig pain, n/v. The complaint involves an extensive differential diagnosis and also carries with it a high risk of complications and morbidity.  Serious etiology was considered. Ddx includes but is not limited to: Differential diagnosis includes but is not exclusive to acute cholecystitis, intrathoracic causes for epigastric abdominal pain, gastritis, duodenitis, pancreatitis, small bowel or large bowel obstruction, abdominal aortic aneurysm, hernia, gastritis, etc.  On initial assessment the patient is: resting comfortably, NAD Vital signs and nursing notes were reviewed  Clinical Course as of 05/10/22 1805  Fri May 10, 2022  1235 Pt has not had his anti-hypertensive medication in multiple days, will give here in the ED. BP is elevated but he has no chest pain, no neuro complaints.  [SG]    Clinical Course User Index [SG] Jeanell Sparrow, DO   Screening labs were ordered in triage, his lipase is marginally elevated; no pancreatitis on CT. Cr elev from his baseline (CKD). No leukocytosis. Will give IVF, anti-emetic, gi cocktail, analgesia. CT a/p was also ordered to further assess pt's abdominal  pain.  CTAP with gastritis/esophagitis, diverticulosis but o/w stable   On re-assessment he continues to have reported abdominal pain, BP is also elevated. Pt requring multiple doses of iv analgesics  including droperidol and dilaudid with persisting abdominal pain. BP also remains elevated despite mx doses anti-hypertensives, started cardene gtt.  Recommend admission for intractable abd pain. Also hypertensive urgency started on cardene. Admit to [facility] Dr Marylyn Ishihara        Additional history obtained: -Additional history obtained from na -External records from outside source obtained and reviewed including: Chart review including previous notes, labs, imaging, consultation notes including prior ED visits, prior labs/imaging/home medications. Primary care documentation, prior admission. Of note was discharged 11/16   Lab Tests: -I ordered, reviewed, and interpreted labs.   The pertinent results include:   Labs Reviewed  LIPASE, BLOOD - Abnormal; Notable for the following components:      Result Value   Lipase 84 (*)    All other components within normal limits  COMPREHENSIVE METABOLIC PANEL - Abnormal; Notable for the following components:   Sodium 131 (*)    Glucose, Bld 336 (*)    BUN 23 (*)    Creatinine, Ser 1.72 (*)    Total Protein 8.7 (*)    GFR, Estimated 50 (*)    All other components within normal limits  URINALYSIS, ROUTINE W REFLEX MICROSCOPIC - Abnormal; Notable for the following components:   Glucose, UA >=500 (*)    Protein, ur >=300 (*)    All other components within normal limits  CBC WITH DIFFERENTIAL/PLATELET - Abnormal; Notable for the following components:   Hemoglobin 12.2 (*)    HCT 37.3 (*)    All other components within normal limits  HEMOGLOBIN A1C  COMPREHENSIVE METABOLIC PANEL  CBC    Notable for lipase is minimally elevated, sodium marginally low. Will give IVF, analgesia,  EKG   EKG Interpretation  Date/Time:  Friday May 10 2022 15:07:29  EST Ventricular Rate:  95 PR Interval:  181 QRS Duration: 86 QT Interval:  358 QTC Calculation: 450 R Axis:     Text Interpretation: Normal sinus rhythm no stemi 11/22 similar tracing Confirmed by Wynona Dove ([id number]) on 05/10/2022 3:11:52 PM         Imaging Studies ordered: I ordered imaging studies including CTAP On my interpretation imaging demonstrates gastritis/esophagitis, diverticulosis  I independently visualized and interpreted imaging. I agree with the radiologist interpretation   Medicines ordered and prescription drug management: Meds ordered this encounter  Medications   ondansetron (ZOFRAN-ODT) disintegrating tablet 4 mg   sodium chloride 0.9 % bolus 1,000 mL   famotidine (PEPCID) IVPB 20 mg premix   ondansetron (ZOFRAN) injection 4 mg   alum & mag hydroxide-simeth (MAALOX/MYLANTA) 200-200-20 MG/5ML suspension 15 mL   HYDROmorphone (DILAUDID) injection 0.5 mg   amLODipine (NORVASC) tablet 5 mg   carvedilol (COREG) tablet 25 mg   hydrALAZINE (APRESOLINE) injection 5 mg   iohexol (OMNIPAQUE) 300 MG/ML solution 100 mL   hydrALAZINE (APRESOLINE) injection 5 mg   HYDROmorphone (DILAUDID) injection 0.5 mg   sucralfate (CARAFATE) tablet 1 g   pantoprazole (PROTONIX) injection 40 mg   DISCONTD: ketorolac (TORADOL) 15 MG/ML injection 15 mg   droperidol (INAPSINE) 2.5 MG/ML injection 2.5 mg   hydrALAZINE (APRESOLINE) injection 20 mg   HYDROmorphone (DILAUDID) injection 0.5 mg   nicardipine (CARDENE) 40m in 0.86% saline 2041mIV infusion (0.1 mg/ml)   prochlorperazine (COMPAZINE) injection 10 mg   pantoprazole (PROTONIX) injection 40 mg   insulin aspart (novoLOG) injection 0-15 Units    Order Specific Question:   Correction coverage:    Answer:   Moderate (average weight, post-op)    Order Specific  Question:   CBG < 70:    Answer:   Implement Hypoglycemia Standing Orders and refer to Hypoglycemia Standing Orders sidebar report    Order Specific Question:   CBG 70 -  120:    Answer:   0 units    Order Specific Question:   CBG 121 - 150:    Answer:   2 units    Order Specific Question:   CBG 151 - 200:    Answer:   3 units    Order Specific Question:   CBG 201 - 250:    Answer:   5 units    Order Specific Question:   CBG 251 - 300:    Answer:   8 units    Order Specific Question:   CBG 301 - 350:    Answer:   11 units    Order Specific Question:   CBG 351 - 400:    Answer:   15 units    Order Specific Question:   CBG > 400    Answer:   call MD and obtain STAT lab verification   insulin aspart (novoLOG) injection 0-5 Units    Order Specific Question:   Correction coverage:    Answer:   HS scale    Order Specific Question:   CBG < 70:    Answer:   Implement Hypoglycemia Standing Orders and refer to Hypoglycemia Standing Orders sidebar report    Order Specific Question:   CBG 70 - 120:    Answer:   0 units    Order Specific Question:   CBG 121 - 150:    Answer:   0 units    Order Specific Question:   CBG 151 - 200:    Answer:   0 units    Order Specific Question:   CBG 201 - 250:    Answer:   2 units    Order Specific Question:   CBG 251 - 300:    Answer:   3 units    Order Specific Question:   CBG 301 - 350:    Answer:   4 units    Order Specific Question:   CBG 351 - 400:    Answer:   5 units    Order Specific Question:   CBG > 400    Answer:   call MD and obtain STAT lab verification   enoxaparin (LOVENOX) injection 40 mg   0.9 %  sodium chloride infusion   HYDROmorphone (DILAUDID) injection 0.5 mg    -I have reviewed the patients home medicines and have made adjustments as needed   Consultations Obtained: na   Cardiac Monitoring: The patient was maintained on a cardiac monitor.  I personally viewed and interpreted the cardiac monitored which showed an underlying rhythm of: NSR  Social Determinants of Health:  Diagnosis or treatment significantly limited by social determinants of health: former smoker, polysubstance abuse, and  uninsured   Reevaluation: After the interventions noted above, I reevaluated the patient and found that they have stayed the same  Co morbidities that complicate the patient evaluation  Past Medical History:  Diagnosis Date   Essential hypertension 05/31/2019   HLD (hyperlipidemia)    Pancreatitis    Type II diabetes mellitus ([place])       Dispostion: Disposition decision including need for hospitalization was considered, and patient admitted to the hospital.    Final Clinical Impression(s) / ED Diagnoses Final diagnoses:  Intractable abdominal pain  Hypertensive urgency  This chart was dictated using voice recognition software.  Despite best efforts to proofread,  errors can occur which can change the documentation meaning.    Jeanell Sparrow, DO 05/10/22 1805

## 2022-05-10 NOTE — ED Notes (Signed)
Dr. Wallace Cullens aware pts BP 202/121. Pt denies SOB, denies chest pain.

## 2022-05-10 NOTE — ED Triage Notes (Signed)
Arrives EMS with c/o severe abdominal pain after eating fried chicken last night.   Sts hx of recurrent pancreatitis.   Visually impaired.

## 2022-05-10 NOTE — ED Notes (Signed)
Pt aware UA is needed.  

## 2022-05-10 NOTE — ED Notes (Signed)
Pts Cardene drip stopped at this time, Dr. Ronaldo Miyamoto aware. Pts BP 163/97.

## 2022-05-10 NOTE — ED Notes (Signed)
Dr. Wallace Cullens aware pts BP 222/128. Pt sleeping.

## 2022-05-10 NOTE — H&P (Signed)
History and Physical    Patient: Randy [name]. UMP:[id number] DOB: 03/15/1981 DOA: 05/10/2022 DOS: the patient was seen and examined on 05/10/2022 PCP: Camillia Herter, NP  Patient coming from: Home  Chief Complaint:  Chief Complaint  Patient presents with   Abdominal Pain   HPI: Randy [name]. is a 41 y.o. male with medical history significant of HTN, HLD, DM2, recurrent pancreatitis. Presenting with abdominal pain. He was in his normal state of health until yesterday. After eating some fried food yesterday evening, he started having RUQ/LUQ crampy abdominal pain. He had N/V. No fevers, diarrhea, or sick contacts. He didn't try any medicines to help. When his symptoms did not improve last night, he decided to come to the ED for evaluation. He denies any other aggravating or alleviating factors.   Review of Systems: As mentioned in the history of present illness. All other systems reviewed and are negative. Past Medical History:  Diagnosis Date   Essential hypertension 05/31/2019   HLD (hyperlipidemia)    Pancreatitis    Type II diabetes mellitus ([place])    Past Surgical History:  Procedure Laterality Date   NO PAST SURGERIES     Social History:  reports that he quit smoking about 10 years ago. His smoking use included cigarettes. He has a 5.61 pack-year smoking history. He has been exposed to tobacco smoke. He has never used smokeless tobacco. He reports current drug use. Drug: Marijuana. He reports that he does not drink alcohol.  Allergies  Allergen Reactions   Tomato Itching    Family History  Problem Relation Age of Onset   Hypertension Mother    Diabetes Father    Hypertension Father    Diabetes Brother    Diabetes Maternal Grandmother    Colon cancer Neg Hx    Esophageal cancer Neg Hx    Rectal cancer Neg Hx    Stomach cancer Neg Hx     Prior to Admission medications   Medication Sig Start Date End Date Taking? Authorizing Provider  amLODipine  (NORVASC) 5 MG tablet Take 2 tablets (10 mg total) by mouth daily. Patient taking differently: Take 5-10 mg by mouth daily. 01/06/22 03/27/22  Elodia Florence., MD  atorvastatin (LIPITOR) 40 MG tablet Take 2 tablets (80 mg total) by mouth daily. Patient taking differently: Take 40-80 mg by mouth daily. 01/06/22 05/06/22  Elodia Florence., MD  blood glucose meter kit and supplies KIT Dispense based on patient and insurance preference. Use up to four times daily as directed. 01/06/22   Elodia Florence., MD  carvedilol (COREG) 25 MG tablet Take 1 tablet (25 mg total) by mouth 2 (two) times daily with a meal. 01/06/22 05/06/22  Elodia Florence., MD  glucose blood (ACCU-CHEK GUIDE) test strip Use to check blood sugar 3 times daily. 10/15/21   Charlott Rakes, MD  glucose blood test strip USE UP TO FOUR TIMES DAILY AS DIRECTED 01/06/22   Elodia Florence., MD  hydrALAZINE (APRESOLINE) 100 MG tablet Take 1 tablet (100 mg total) by mouth every 8 (eight) hours. Patient taking differently: Take 100 mg by mouth 2 (two) times daily. 01/06/22 03/07/22  Elodia Florence., MD  Insulin Glargine [facility]) 100 UNIT/ML Inject 18 Units into the skin daily. Patient taking differently: Inject 35 Units into the skin daily. 01/06/22 05/18/22  Elodia Florence., MD  Insulin Pen Needle (TRUEPLUS 5-BEVEL PEN NEEDLES) 32G X 4 MM MISC  Use to inject basaglar once daily. 01/30/22   Camillia Herter, NP  metoCLOPramide (REGLAN) 5 MG tablet Take 1 tablet (5 mg total) by mouth 4 (four) times daily -  before meals and at bedtime for 14 days. Please discuss with your PCP whether this needs to be continued Patient not taking: Reported on 03/27/2022 01/06/22 05/18/22  Elodia Florence., MD  omeprazole (PRILOSEC) 20 MG capsule Take 1 capsule (20 mg total) by mouth daily. 03/27/22   Quintella Reichert, MD  ondansetron (ZOFRAN-ODT) 4 MG disintegrating tablet Take 1 tablet (4 mg total) by mouth every 8  (eight) hours as needed for nausea or vomiting. 03/27/22   Quintella Reichert, MD  TRUEplus Lancets 28G MISC USE UP TO 4 TIMES DAILY 01/06/22   Elodia Florence., MD    Physical Exam: Vitals:   05/10/22 1452 05/10/22 1515 05/10/22 1600 05/10/22 1624  BP: (!) 194/108 (!) 151/121 (!) 222/128   Pulse: 80 92 79   Resp: 18 (!) 22 (!) 21   Temp:    98.1 F (36.7 C)  TempSrc:    Oral  SpO2: 99% 99% 99%    General: 41 y.o. male resting in bed in NAD ENMT: Nares patent w/o discharge, orophaynx clear, dentition normal, ears w/o discharge/lesions/ulcers Neck: Supple, trachea midline Cardiovascular: RRR, +S1, S2, no m/g/r, equal pulses throughout Respiratory: CTABL, no w/r/r, normal WOB GI: BS+, NDNT, no masses noted, no organomegaly noted MSK: No e/c/c Skin: No rashes, bruises, ulcerations noted Neuro: A&O x 3, no focal deficits, drowsy Psyc: Appropriate interaction and affect, calm/cooperative  Data Reviewed:  Results for orders placed or performed during the hospital encounter of 05/10/22 (from the past 24 hour(s))  Lipase, blood     Status: Abnormal   Collection Time: 05/10/22  3:24 AM  Result Value Ref Range   Lipase 84 (H) 11 - 51 U/L  Comprehensive metabolic panel     Status: Abnormal   Collection Time: 05/10/22  3:24 AM  Result Value Ref Range   Sodium 131 (L) 135 - 145 mmol/L   Potassium 5.0 3.5 - 5.1 mmol/L   Chloride 98 98 - 111 mmol/L   CO2 24 22 - 32 mmol/L   Glucose, Bld 336 (H) 70 - 99 mg/dL   BUN 23 (H) 6 - 20 mg/dL   Creatinine, Ser 1.72 (H) 0.61 - 1.24 mg/dL   Calcium 9.1 8.9 - 10.3 mg/dL   Total Protein 8.7 (H) 6.5 - 8.1 g/dL   Albumin 3.7 3.5 - 5.0 g/dL   AST 24 15 - 41 U/L   ALT 21 0 - 44 U/L   Alkaline Phosphatase 111 38 - 126 U/L   Total Bilirubin 0.8 0.3 - 1.2 mg/dL   GFR, Estimated 50 (L) >60 mL/min   Anion gap 9 5 - 15  CBC with Differential     Status: Abnormal   Collection Time: 05/10/22  3:24 AM  Result Value Ref Range   WBC 8.1 4.0 - 10.5 K/uL    RBC 4.49 4.22 - 5.81 MIL/uL   Hemoglobin 12.2 (L) 13.0 - 17.0 g/dL   HCT 37.3 (L) 39.0 - 52.0 %   MCV 83.1 80.0 - 100.0 fL   MCH 27.2 26.0 - 34.0 pg   MCHC 32.7 30.0 - 36.0 g/dL   RDW 12.1 11.5 - 15.5 %   Platelets 332 150 - 400 K/uL   nRBC 0.0 0.0 - 0.2 %   Neutrophils Relative % 73 %  Neutro Abs 5.9 1.7 - 7.7 K/uL   Lymphocytes Relative 21 %   Lymphs Abs 1.7 0.7 - 4.0 K/uL   Monocytes Relative 4 %   Monocytes Absolute 0.4 0.1 - 1.0 K/uL   Eosinophils Relative 1 %   Eosinophils Absolute 0.1 0.0 - 0.5 K/uL   Basophils Relative 1 %   Basophils Absolute 0.1 0.0 - 0.1 K/uL   Immature Granulocytes 0 %   Abs Immature Granulocytes 0.03 0.00 - 0.07 K/uL  Urinalysis, Routine w reflex microscopic     Status: Abnormal   Collection Time: 05/10/22 12:57 PM  Result Value Ref Range   Color, Urine YELLOW YELLOW   APPearance CLEAR CLEAR   Specific Gravity, Urine 1.020 1.005 - 1.030   pH 8.0 5.0 - 8.0   Glucose, UA >=500 (A) NEGATIVE mg/dL   Hgb urine dipstick NEGATIVE NEGATIVE   Bilirubin Urine NEGATIVE NEGATIVE   Ketones, ur NEGATIVE NEGATIVE mg/dL   Protein, ur >=300 (A) NEGATIVE mg/dL   Nitrite NEGATIVE NEGATIVE   Leukocytes,Ua NEGATIVE NEGATIVE   RBC / HPF 0-5 0 - 5 RBC/hpf   WBC, UA 0-5 0 - 5 WBC/hpf   Bacteria, UA NONE SEEN NONE SEEN   Squamous Epithelial / LPF 0-5 0 - 5 /HPF   CT ab/pelvis 1. Wall thickening versus underdistention of the gastric cardia and antrum. Correlate for gastritis. 2. Mild symmetric wall thickening of the gastroesophageal junction may reflect sequela of emesis or esophagitis. 3. Left-sided diverticulosis without findings of acute diverticulitis. 4. Enlarged prostate gland.  Assessment and Plan: Intractable N/V Abdominal pain     - admit to inpt, SDU     - fluids, anti-emetics     - NPO for now     - pain control     - CT as above (no pancreatitis seen, but he does have mildly elevated lipase)  HTN urgency     - not responsive to PRNs and  resuming his home meds     - EDP starting cardene; will continue for now  DM2     - A1c, SSI, glucose checks     - he is NPO for now  HLD     - resume home regimen when off NPO status  Hyponatremia     - mild, fluids, follow  GERD     - PPI  CKD 3a     - at baseline, follow  Advance Care Planning:   Code Status: FULL  Consults: None  Family Communication: w/ family at bedside  Severity of Illness: The appropriate patient status for this patient is INPATIENT. Inpatient status is judged to be reasonable and necessary in order to provide the required intensity of service to ensure the patient's safety. The patient's presenting symptoms, physical exam findings, and initial radiographic and laboratory data in the context of their chronic comorbidities is felt to place them at high risk for further clinical deterioration. Furthermore, it is not anticipated that the patient will be medically stable for discharge from the hospital within 2 midnights of admission.   * I certify that at the point of admission it is my clinical judgment that the patient will require inpatient hospital care spanning beyond 2 midnights from the point of admission due to high intensity of service, high risk for further deterioration and high frequency of surveillance required.*  Author: Jonnie Finner, DO 05/10/2022 4:51 PM  For on call review [email or web address].

## 2022-05-11 DIAGNOSIS — R112 Nausea with vomiting, unspecified: Secondary | ICD-10-CM

## 2022-05-11 DIAGNOSIS — N1831 Chronic kidney disease, stage 3a: Secondary | ICD-10-CM

## 2022-05-11 DIAGNOSIS — E871 Hypo-osmolality and hyponatremia: Secondary | ICD-10-CM

## 2022-05-11 LAB — CBC
HCT: 32.5 % — ABNORMAL LOW (ref 39.0–52.0)
Hemoglobin: 10.4 g/dL — ABNORMAL LOW (ref 13.0–17.0)
MCH: 27.3 pg (ref 26.0–34.0)
MCHC: 32 g/dL (ref 30.0–36.0)
MCV: 85.3 fL (ref 80.0–100.0)
Platelets: 296 10*3/uL (ref 150–400)
RBC: 3.81 MIL/uL — ABNORMAL LOW (ref 4.22–5.81)
RDW: 12.3 % (ref 11.5–15.5)
WBC: 9.3 10*3/uL (ref 4.0–10.5)
nRBC: 0 % (ref 0.0–0.2)

## 2022-05-11 LAB — COMPREHENSIVE METABOLIC PANEL
ALT: 16 U/L (ref 0–44)
AST: 14 U/L — ABNORMAL LOW (ref 15–41)
Albumin: 3.3 g/dL — ABNORMAL LOW (ref 3.5–5.0)
Alkaline Phosphatase: 85 U/L (ref 38–126)
Anion gap: 8 (ref 5–15)
BUN: 26 mg/dL — ABNORMAL HIGH (ref 6–20)
CO2: 21 mmol/L — ABNORMAL LOW (ref 22–32)
Calcium: 8.6 mg/dL — ABNORMAL LOW (ref 8.9–10.3)
Chloride: 107 mmol/L (ref 98–111)
Creatinine, Ser: 2.03 mg/dL — ABNORMAL HIGH (ref 0.61–1.24)
GFR, Estimated: 41 mL/min — ABNORMAL LOW (ref >60)
Glucose, Bld: 276 mg/dL — ABNORMAL HIGH (ref 70–99)
Potassium: 3.9 mmol/L (ref 3.5–5.1)
Sodium: 136 mmol/L (ref 135–145)
Total Bilirubin: 0.6 mg/dL (ref 0.3–1.2)
Total Protein: 7.2 g/dL (ref 6.5–8.1)

## 2022-05-11 LAB — GLUCOSE, CAPILLARY
Glucose-Capillary: 278 mg/dL — ABNORMAL HIGH (ref 70–99)
Glucose-Capillary: 136 mg/dL — ABNORMAL HIGH (ref 70–99)
Glucose-Capillary: 172 mg/dL — ABNORMAL HIGH (ref 70–99)
Glucose-Capillary: 186 mg/dL — ABNORMAL HIGH (ref 70–99)

## 2022-05-11 MED ORDER — AMLODIPINE BESYLATE 5 MG PO TABS
5.0000 mg | ORAL_TABLET | Freq: Every day | ORAL | Status: DC
  Administered 2022-05-12: 5 mg via ORAL
  Filled 2022-05-11: qty 1

## 2022-05-11 MED ORDER — HYDRALAZINE HCL 50 MG PO TABS
100.0000 mg | ORAL_TABLET | Freq: Three times a day (TID) | ORAL | Status: DC
  Administered 2022-05-11 – 2022-05-12 (×4): 100 mg via ORAL
  Filled 2022-05-11 (×4): qty 2

## 2022-05-11 MED ORDER — CARVEDILOL 25 MG PO TABS
25.0000 mg | ORAL_TABLET | Freq: Two times a day (BID) | ORAL | Status: DC
  Administered 2022-05-11 – 2022-05-12 (×2): 25 mg via ORAL
  Filled 2022-05-11 (×3): qty 1

## 2022-05-11 MED ORDER — AMLODIPINE BESYLATE 5 MG PO TABS
5.0000 mg | ORAL_TABLET | Freq: Once | ORAL | Status: AC
  Administered 2022-05-11: 5 mg via ORAL
  Filled 2022-05-11: qty 1

## 2022-05-11 MED ORDER — CARVEDILOL 12.5 MG PO TABS
25.0000 mg | ORAL_TABLET | Freq: Once | ORAL | Status: AC
  Administered 2022-05-11: 25 mg via ORAL
  Filled 2022-05-11: qty 2

## 2022-05-11 MED ORDER — PANTOPRAZOLE SODIUM 40 MG IV SOLR
40.0000 mg | Freq: Two times a day (BID) | INTRAVENOUS | Status: DC
  Administered 2022-05-11 – 2022-05-12 (×3): 40 mg via INTRAVENOUS
  Filled 2022-05-11 (×3): qty 10

## 2022-05-11 MED ORDER — INSULIN DETEMIR 100 UNIT/ML ~~LOC~~ SOLN
5.0000 [IU] | Freq: Two times a day (BID) | SUBCUTANEOUS | Status: DC
  Administered 2022-05-11 – 2022-05-12 (×3): 5 [IU] via SUBCUTANEOUS
  Filled 2022-05-11 (×5): qty 0.05

## 2022-05-11 MED ORDER — NICARDIPINE HCL IN NACL 40-0.83 MG/200ML-% IV SOLN
3.0000 mg/h | INTRAVENOUS | Status: DC
  Administered 2022-05-11: 10.5 mg/h via INTRAVENOUS
  Administered 2022-05-11: 13 mg/h via INTRAVENOUS
  Filled 2022-05-11 (×4): qty 200

## 2022-05-11 MED ORDER — SODIUM CHLORIDE 0.9 % IV SOLN: INTRAVENOUS | Status: DC

## 2022-05-11 MED ORDER — METOCLOPRAMIDE HCL 5 MG PO TABS
5.0000 mg | ORAL_TABLET | Freq: Three times a day (TID) | ORAL | Status: DC
  Administered 2022-05-11 – 2022-05-12 (×5): 5 mg via ORAL
  Filled 2022-05-11 (×7): qty 1

## 2022-05-11 MED ORDER — ALUM & MAG HYDROXIDE-SIMETH 200-200-20 MG/5ML PO SUSP
15.0000 mL | Freq: Once | ORAL | Status: AC
  Administered 2022-05-11: 15 mL via ORAL
  Filled 2022-05-11: qty 30

## 2022-05-11 NOTE — Progress Notes (Signed)
[facility] PROGRESS NOTE    Progress Note  Randy [name].  DTO:[id number] DOB: 10/06/1980 DOA: 05/10/2022 PCP: Rema Fendt, NP     Brief Narrative:   Randy [name]. is an 41 y.o. male past medical history of essential hypertension hyperlipidemia diabetes mellitus type 2 recurrent pancreatitis presenting with abdominal pain to eating fried food the day prior to admission had nausea and vomiting.  Was noncompliant with his medication   Assessment/Plan:   Intractable nausea vomiting and abdominal pain possibly due to pancreatitis: Lipase is elevated. Will place him n.p.o. except for meds.  IV fluids and narcotics. See scan of the abdomen pelvis showed no acute findings. Continue Protonix IV twice daily CT shows possible gastritis.  Hypertensive urgency Currently on IV nicardipine, restarting Coreg and Norvasc his blood pressure slightly improved we will try to wean off nicardipine. He relates he ran out of his medication has not been taking it, resume hydralazine Coreg and Norvasc. Never able to get him off nicardipine can transfer to MedSurg.  Diabetes mellitus type 2: Currently n.p.o. continue sliding scale insulin, continue CBGs Q4 at long-acting insulin. Pancreatitis is improved can start oral hypoglycemic agents.  HLD (hyperlipidemia) Resume statins once able to take p.o.'s.  Hypovolemic hyponatremia: Likely due to nausea vomiting basic metabolic panels pending this morning.  Chronic kidney disease stage IIIa: Creatinine appears to be at baseline.  GERD (gastroesophageal reflux disease) Continue PPI.   DVT prophylaxis: lovenox Family Communication:none Status is: Inpatient Remains inpatient appropriate because: Intractable nausea and vomiting and pancreatitis along with hypertensive urgency.    Code Status:     Code Status Orders  (From admission, onward)           Start     Ordered   05/10/22 1709  Full code  Continuous        Question:  By:  Answer:  Consent: discussion documented in EHR   05/10/22 1712           Code Status History     Date Active Date Inactive Code Status Order ID Comments User Context   03/27/2022 0739 03/28/2022 2315 Full Code [id number]  Bobette Mo, MD ED   01/04/2022 0135 01/06/2022 2006 Full Code [id number]  Anselm Jungling, DO ED   09/11/2021 2153 09/13/2021 2032 Full Code [id number]  Charlsie Quest, MD ED   06/18/2021 0813 06/21/2021 2047 Full Code [id number]  Jonah Blue, MD ED   04/02/2021 0524 04/04/2021 1623 Full Code [id number]  John Giovanni, MD ED   09/17/2020 0715 09/17/2020 1406 Full Code [id number]  Marinda Elk, MD ED   09/12/2020 0403 09/13/2020 2117 Full Code [id number]  Angie Fava, DO ED   04/08/2020 2305 04/11/2020 1908 Full Code [id number]  Shirlean Mylar, MD ED   08/27/2019 1351 08/28/2019 1834 Full Code [id number]  Verdene Lennert, MD ED   08/31/2015 1839 09/02/2015 1529 Full Code [id number]  Rama, Maryruth Bun, MD Inpatient         IV Access:   Peripheral IV   Procedures and diagnostic studies:   CT ABDOMEN PELVIS W CONTRAST  Result Date: 05/10/2022 CLINICAL DATA:  24 hours of lower abdominal pain and vomiting. EXAM: CT ABDOMEN AND PELVIS WITH CONTRAST TECHNIQUE: Multidetector CT imaging of the abdomen and pelvis was performed using the standard protocol following bolus administration of intravenous contrast. RADIATION DOSE REDUCTION: This exam was performed according to the departmental dose-optimization program which includes automated exposure control, adjustment of the mA  and/or kV according to patient size and/or use of iterative reconstruction technique. CONTRAST:  OMNIPAQUE IOHEXOL 300 MG/ML  SOLN COMPARISON:  Multiple priors including CT March 27, 2022 FINDINGS: Lower chest: No acute abnormality. Similar mild symmetric wall thickening at the gastroesophageal junction. Hepatobiliary: No suspicious hepatic lesion. Probable adenomyomatosis of  the gallbladder fundus. No evidence of gallbladder inflammation. No biliary ductal dilation. Pancreas: No pancreatic ductal dilation or evidence of acute inflammation. Spleen: No splenomegaly or focal splenic lesion. Adrenals/Urinary Tract: Bilateral adrenal glands appear normal. No hydronephrosis. Kidneys demonstrate symmetric enhancement. Urinary bladder is unremarkable for degree of distension. Stomach/Bowel: Wall thickening versus underdistention of the gastric cardia and antrum. No pathologic dilation of small or large bowel. The appendix and terminal ileum appear normal. Left-sided diverticulosis without findings of acute diverticulitis. Vascular/Lymphatic: Normal caliber abdominal aorta. No pathologically enlarged abdominal or pelvic lymph nodes. Reproductive: Enlarged prostate gland. Other: No significant abdominopelvic free fluid. Musculoskeletal: No acute osseous abnormality. IMPRESSION: 1. Wall thickening versus underdistention of the gastric cardia and antrum. Correlate for gastritis. 2. Mild symmetric wall thickening of the gastroesophageal junction may reflect sequela of emesis or esophagitis. 3. Left-sided diverticulosis without findings of acute diverticulitis. 4. Enlarged prostate gland. Electronically Signed   By: Maudry Mayhew M.D.   On: 05/10/2022 12:59     Medical Consultants:   None.   Subjective:    Randy [name]. relates he feels better he relates he was not taking his medication.  Objective:    Vitals:   05/11/22 0630 05/11/22 0645 05/11/22 0700 05/11/22 0715  BP: 139/82 (!) 146/87 137/88 (!) 165/74  Pulse: (!) 101 (!) 105 100 (!) 105  Resp: 12 15 14 20   Temp:      TempSrc:      SpO2: 95% 98% 96% 96%  Weight:      Height:       SpO2: 96 % O2 Flow Rate (L/min): 2 L/min   Intake/Output Summary (Last 24 hours) at 05/11/2022 0731 Last data filed at 05/11/2022 0422 Gross per 24 hour  Intake 2579.57 ml  Output 450 ml  Net 2129.57 ml   Filed Weights    05/10/22 1805  Weight: 90.7 kg    Exam: General exam: In no acute distress. Respiratory system: Good air movement and clear to auscultation. Cardiovascular system: S1 & S2 heard, RRR. No JVD. Gastrointestinal system: Abdomen is nondistended, soft and nontender.  Extremities: No pedal edema. Skin: No rashes, lesions or ulcers Psychiatry: Judgement and insight appear normal. Mood & affect appropriate.    Data Reviewed:    Labs: Basic Metabolic Panel: Recent Labs  Lab 05/10/22 0324  NA 131*  K 5.0  CL 98  CO2 24  GLUCOSE 336*  BUN 23*  CREATININE 1.72*  CALCIUM 9.1   GFR Estimated Creatinine Clearance: 61.2 mL/min (A) (by C-G formula based on SCr of 1.72 mg/dL (H)). Liver Function Tests: Recent Labs  Lab 05/10/22 0324  AST 24  ALT 21  ALKPHOS 111  BILITOT 0.8  PROT 8.7*  ALBUMIN 3.7   Recent Labs  Lab 05/10/22 0324  LIPASE 84*   No results for input(s): "AMMONIA" in the last 168 hours. Coagulation profile No results for input(s): "INR", "PROTIME" in the last 168 hours. COVID-19 Labs  No results for input(s): "DDIMER", "FERRITIN", "LDH", "CRP" in the last 72 hours.  Lab Results  Component Value Date   SARSCOV2NAA NEGATIVE 08/01/2021   SARSCOV2NAA NEGATIVE 06/19/2021   SARSCOV2NAA NEGATIVE 04/01/2021   SARSCOV2NAA  NEGATIVE 09/17/2020    CBC: Recent Labs  Lab 05/10/22 0324  WBC 8.1  NEUTROABS 5.9  HGB 12.2*  HCT 37.3*  MCV 83.1  PLT 332   Cardiac Enzymes: No results for input(s): "CKTOTAL", "CKMB", "CKMBINDEX", "TROPONINI" in the last 168 hours. BNP (last 3 results) No results for input(s): "PROBNP" in the last 8760 hours. CBG: Recent Labs  Lab 05/10/22 2139  GLUCAP 306*   D-Dimer: No results for input(s): "DDIMER" in the last 72 hours. Hgb A1c: No results for input(s): "HGBA1C" in the last 72 hours. Lipid Profile: No results for input(s): "CHOL", "HDL", "LDLCALC", "TRIG", "CHOLHDL", "LDLDIRECT" in the last 72 hours. Thyroid  function studies: No results for input(s): "TSH", "T4TOTAL", "T3FREE", "THYROIDAB" in the last 72 hours.  Invalid input(s): "FREET3" Anemia work up: No results for input(s): "VITAMINB12", "FOLATE", "FERRITIN", "TIBC", "IRON", "RETICCTPCT" in the last 72 hours. Sepsis Labs: Recent Labs  Lab 05/10/22 0324  WBC 8.1   Microbiology Recent Results (from the past 240 hour(s))  MRSA Next Gen by PCR, Nasal     Status: None   Collection Time: 05/10/22  8:46 PM   Specimen: Nasal Mucosa; Nasal Swab  Result Value Ref Range Status   MRSA by PCR Next Gen NOT DETECTED NOT DETECTED Final    Comment: (NOTE) The GeneXpert MRSA Assay (FDA approved for NASAL specimens only), is one component of a comprehensive MRSA colonization surveillance program. It is not intended to diagnose MRSA infection nor to guide or monitor treatment for MRSA infections. Test performance is not FDA approved in patients less than 17 years old. Performed at [facility], [address] [place], [place] [address]      Medications:    Chlorhexidine Gluconate Cloth  6 each Topical Daily   enoxaparin (LOVENOX) injection  40 mg Subcutaneous QHS   insulin aspart  0-15 Units Subcutaneous TID WC   insulin aspart  0-5 Units Subcutaneous QHS   pantoprazole (PROTONIX) IV  40 mg Intravenous Q24H   Continuous Infusions:  sodium chloride 75 mL/hr at 05/11/22 0422   niCARDipine 10.5 mg/hr (05/11/22 0422)      LOS: 1 day   Marinda Elk  [facility]  05/11/2022, 7:31 AM

## 2022-05-12 DIAGNOSIS — E1169 Type 2 diabetes mellitus with other specified complication: Secondary | ICD-10-CM

## 2022-05-12 DIAGNOSIS — I10 Essential (primary) hypertension: Secondary | ICD-10-CM

## 2022-05-12 DIAGNOSIS — R109 Unspecified abdominal pain: Secondary | ICD-10-CM

## 2022-05-12 DIAGNOSIS — E785 Hyperlipidemia, unspecified: Secondary | ICD-10-CM

## 2022-05-12 LAB — BASIC METABOLIC PANEL
Anion gap: 5 (ref 5–15)
BUN: 17 mg/dL (ref 6–20)
CO2: 24 mmol/L (ref 22–32)
Calcium: 8.8 mg/dL — ABNORMAL LOW (ref 8.9–10.3)
Chloride: 107 mmol/L (ref 98–111)
Creatinine, Ser: 1.69 mg/dL — ABNORMAL HIGH (ref 0.61–1.24)
GFR, Estimated: 51 mL/min — ABNORMAL LOW (ref >60)
Glucose, Bld: 169 mg/dL — ABNORMAL HIGH (ref 70–99)
Potassium: 4 mmol/L (ref 3.5–5.1)
Sodium: 136 mmol/L (ref 135–145)

## 2022-05-12 LAB — GLUCOSE, CAPILLARY
Glucose-Capillary: 168 mg/dL — ABNORMAL HIGH (ref 70–99)
Glucose-Capillary: 143 mg/dL — ABNORMAL HIGH (ref 70–99)

## 2022-05-12 MED ORDER — ATORVASTATIN CALCIUM 40 MG PO TABS
80.0000 mg | ORAL_TABLET | Freq: Every day | ORAL | 1 refills | Status: DC
  Filled 2022-05-12: qty 60, 30d supply, fill #0

## 2022-05-12 MED ORDER — AMLODIPINE BESYLATE 5 MG PO TABS: 10.0000 mg | ORAL_TABLET | Freq: Every day | ORAL | 1 refills | Status: DC

## 2022-05-12 MED ORDER — PANTOPRAZOLE SODIUM 40 MG PO TBEC: 40.0000 mg | DELAYED_RELEASE_TABLET | Freq: Two times a day (BID) | ORAL | 1 refills | Status: DC

## 2022-05-12 MED ORDER — CARVEDILOL 25 MG PO TABS
25.0000 mg | ORAL_TABLET | Freq: Two times a day (BID) | ORAL | 1 refills | Status: DC
  Filled 2022-05-12: qty 120, 60d supply, fill #0

## 2022-05-12 MED ORDER — ATORVASTATIN CALCIUM 40 MG PO TABS: 80.0000 mg | ORAL_TABLET | Freq: Every day | ORAL | 1 refills | Status: DC

## 2022-05-12 MED ORDER — TRAMADOL HCL 50 MG PO TABS
50.0000 mg | ORAL_TABLET | Freq: Once | ORAL | Status: AC
  Administered 2022-05-12: 50 mg via ORAL
  Filled 2022-05-12: qty 1

## 2022-05-12 MED ORDER — CARVEDILOL 25 MG PO TABS: 25.0000 mg | ORAL_TABLET | Freq: Two times a day (BID) | ORAL | 1 refills | Status: DC

## 2022-05-12 MED ORDER — PANTOPRAZOLE SODIUM 40 MG PO TBEC
40.0000 mg | DELAYED_RELEASE_TABLET | Freq: Two times a day (BID) | ORAL | 1 refills | Status: DC
  Filled 2022-05-12: qty 60, 30d supply, fill #0

## 2022-05-12 MED ORDER — HYDRALAZINE HCL 100 MG PO TABS
100.0000 mg | ORAL_TABLET | Freq: Three times a day (TID) | ORAL | 1 refills | Status: DC
  Filled 2022-05-12: qty 90, 30d supply, fill #0

## 2022-05-12 MED ORDER — AMLODIPINE BESYLATE 5 MG PO TABS
10.0000 mg | ORAL_TABLET | Freq: Every day | ORAL | 1 refills | Status: DC
  Filled 2022-05-12: qty 60, 30d supply, fill #0

## 2022-05-12 MED ORDER — HYDRALAZINE HCL 100 MG PO TABS: 100.0000 mg | ORAL_TABLET | Freq: Three times a day (TID) | ORAL | 1 refills | Status: DC

## 2022-05-12 NOTE — Progress Notes (Signed)
Pt stable. AVS discharge given & education done. Belongings collected.

## 2022-05-12 NOTE — Discharge Summary (Addendum)
Physician Discharge Summary  Dougherty. TIR:[id number] DOB: [birth year] DOA: 05/10/2022  PCP: Camillia Herter, NP  Admit date: 05/10/2022 Discharge date: 05/12/2022  Admitted From: Home Disposition:  home  Recommendations for Outpatient Follow-up:  Follow up with PCP in 1-2 weeks Please obtain BMP/CBC in one week   Home Health:no Equipment/Devices:None  Discharge Condition:Stable CODE STATUS:Full Diet recommendation: Heart Healthy   Brief/Interim Summary: 41 y.o. male past medical history of essential hypertension hyperlipidemia diabetes mellitus type 2 recurrent pancreatitis presenting with abdominal pain to eating fried food the day prior to admission had nausea and vomiting.  Was noncompliant with his medication   Discharge Diagnoses:  Principal Problem:   Hypertensive urgency Active Problems:   HLD (hyperlipidemia)   Type 2 diabetes mellitus with hyperglycemia, with long-term current use of insulin (HCC)   GERD (gastroesophageal reflux disease)   Intractable nausea and vomiting   Abdominal pain   Hyponatremia   Stage 3a chronic kidney disease (CKD) (HCC)  Intractable nausea and vomiting and abdominal pain possibly due to acute pancreatitis: Placed n.p.o. IV fluids and narcotics CT scan showed possible gastritis he was also started on IV Protonix within 24 hours his abdominal pain was improved. He will continue PPI as an outpatient.  Hypertensive urgency: Started on IV nicardipine he was restarted back on his home regimen his blood pressure improved.   He was prescription no changes made we will follow-up with PCP as an outpatient.  Diabetes mellitus type 2 with hyperglycemia: No change made to his medication continue current regimen as an outpatient.  Hypovolemic hyponatremia: Resolved with fluid resuscitation.  Hyperlipidemia: Continue statins.  Chronic kidney disease stage IIIa: Creatinine appears to be at baseline.  GERD: Continue  Protonix  Discharge Instructions  Discharge Instructions     Diet - low sodium heart healthy   Complete by: As directed    Increase activity slowly   Complete by: As directed       Allergies as of 05/12/2022       Reactions   Tomato Itching        Medication List     STOP taking these medications    omeprazole 20 MG capsule Commonly known as: PRILOSEC       TAKE these medications    Accu-Chek Guide test strip Generic drug: glucose blood Use to check blood sugar 3 times daily.   True Metrix Blood Glucose Test test strip Generic drug: glucose blood USE UP TO FOUR TIMES DAILY AS DIRECTED   amLODipine 5 MG tablet Commonly known as: NORVASC Take 2 tablets (10 mg total) by mouth daily. What changed: how much to take   atorvastatin 40 MG tablet Commonly known as: LIPITOR Take 2 tablets (80 mg total) by mouth daily. What changed: how much to take   Basaglar KwikPen 100 UNIT/ML Inject 18 Units into the skin daily. What changed: how much to take   carvedilol 25 MG tablet Commonly known as: COREG Take 1 tablet (25 mg total) by mouth 2 (two) times daily with a meal.   hydrALAZINE 100 MG tablet Commonly known as: APRESOLINE Take 1 tablet (100 mg total) by mouth every 8 (eight) hours. What changed: when to take this   metoCLOPramide 5 MG tablet Commonly known as: REGLAN Take 1 tablet (5 mg total) by mouth 4 (four) times daily -  before meals and at bedtime for 14 days. Please discuss with your PCP whether this needs to be continued   ondansetron 4 MG disintegrating  tablet Commonly known as: ZOFRAN-ODT Take 1 tablet (4 mg total) by mouth every 8 (eight) hours as needed for nausea or vomiting.   pantoprazole 40 MG tablet Commonly known as: Protonix Take 1 tablet (40 mg total) by mouth 2 (two) times daily.   True Metrix Meter w/Device Kit Dispense based on patient and insurance preference. Use up to four times daily as directed.   TRUEplus 5-Bevel Pen  Needles 32G X 4 MM Misc Generic drug: Insulin Pen Needle Use to inject basaglar once daily.   TRUEplus Lancets 28G Misc USE UP TO 4 TIMES DAILY        Allergies  Allergen Reactions   Tomato Itching    Consultations: None   Procedures/Studies: CT ABDOMEN PELVIS W CONTRAST  Result Date: 05/10/2022 CLINICAL DATA:  24 hours of lower abdominal pain and vomiting. EXAM: CT ABDOMEN AND PELVIS WITH CONTRAST TECHNIQUE: Multidetector CT imaging of the abdomen and pelvis was performed using the standard protocol following bolus administration of intravenous contrast. RADIATION DOSE REDUCTION: This exam was performed according to the departmental dose-optimization program which includes automated exposure control, adjustment of the mA and/or kV according to patient size and/or use of iterative reconstruction technique. CONTRAST:  110m OMNIPAQUE IOHEXOL 300 MG/ML  SOLN COMPARISON:  Multiple priors including CT March 27, 2022 FINDINGS: Lower chest: No acute abnormality. Similar mild symmetric wall thickening at the gastroesophageal junction. Hepatobiliary: No suspicious hepatic lesion. Probable adenomyomatosis of the gallbladder fundus. No evidence of gallbladder inflammation. No biliary ductal dilation. Pancreas: No pancreatic ductal dilation or evidence of acute inflammation. Spleen: No splenomegaly or focal splenic lesion. Adrenals/Urinary Tract: Bilateral adrenal glands appear normal. No hydronephrosis. Kidneys demonstrate symmetric enhancement. Urinary bladder is unremarkable for degree of distension. Stomach/Bowel: Wall thickening versus underdistention of the gastric cardia and antrum. No pathologic dilation of small or large bowel. The appendix and terminal ileum appear normal. Left-sided diverticulosis without findings of acute diverticulitis. Vascular/Lymphatic: Normal caliber abdominal aorta. No pathologically enlarged abdominal or pelvic lymph nodes. Reproductive: Enlarged prostate gland.  Other: No significant abdominopelvic free fluid. Musculoskeletal: No acute osseous abnormality. IMPRESSION: 1. Wall thickening versus underdistention of the gastric cardia and antrum. Correlate for gastritis. 2. Mild symmetric wall thickening of the gastroesophageal junction may reflect sequela of emesis or esophagitis. 3. Left-sided diverticulosis without findings of acute diverticulitis. 4. Enlarged prostate gland. Electronically Signed   By: JDahlia BailiffM.D.   On: 05/10/2022 12:59   (Echo, Carotid, EGD, Colonoscopy, ERCP)    Subjective: No complaints  Discharge Exam: Vitals:   05/11/22 2152 05/12/22 0522  BP: 135/80 (!) 145/79  Pulse: 80 89  Resp: 16 18  Temp: 98.5 F (36.9 C) 98 F (36.7 C)  SpO2: 99% 98%   Vitals:   05/11/22 1501 05/11/22 1601 05/11/22 2152 05/12/22 0522  BP: (!) 151/76 (!) 142/59 135/80 (!) 145/79  Pulse: 88 92 80 89  Resp: _0 Temp:  98 F (36.7 C) 98.5 F (36.9 C) 98 F (36.7 C)  TempSrc:   Oral Oral  SpO2: 96% 98% 99% 98%  Weight:  87.3 kg    Height:  _1  (1.727 m)      General: Pt is alert, awake, not in acute distress Cardiovascular: RRR, S1/S2 +, no rubs, no gallops Respiratory: CTA bilaterally, no wheezing, no rhonchi Abdominal: Soft, NT, ND, bowel sounds + Extremities: no edema, no cyanosis    The results of significant diagnostics from this hospitalization (including imaging, microbiology, ancillary and laboratory)  are listed below for reference.     Microbiology: Recent Results (from the past 240 hour(s))  MRSA Next Gen by PCR, Nasal     Status: None   Collection Time: 05/10/22  8:46 PM   Specimen: Nasal Mucosa; Nasal Swab  Result Value Ref Range Status   MRSA by PCR Next Gen NOT DETECTED NOT DETECTED Final    Comment: (NOTE) The GeneXpert MRSA Assay (FDA approved for NASAL specimens only), is one component of a comprehensive MRSA colonization surveillance program. It is not intended to diagnose MRSA infection nor to  guide or monitor treatment for MRSA infections. Test performance is not FDA approved in patients less than 71 years old. Performed at [facility], [place] [address] [place], [place] [address]      Labs: BNP (last 3 results) Recent Labs    06/18/21 0606  BNP 195.0*   Basic Metabolic Panel: Recent Labs  Lab 05/10/22 0324 05/11/22 0847 05/12/22 0844  NA 131* 136 136  K 5.0 3.9 4.0  CL 98 107 107  CO2 24 21* 24  GLUCOSE 336* 276* 169*  BUN 23* 26* 17  CREATININE 1.72* 2.03* 1.69*  CALCIUM 9.1 8.6* 8.8*   Liver Function Tests: Recent Labs  Lab 05/10/22 0324 05/11/22 0847  AST 24 14*  ALT 21 16  ALKPHOS 111 85  BILITOT 0.8 0.6  PROT 8.7* 7.2  ALBUMIN 3.7 3.3*   Recent Labs  Lab 05/10/22 0324  LIPASE 84*   No results for input(s): "AMMONIA" in the last 168 hours. CBC: Recent Labs  Lab 05/10/22 0324 05/11/22 0847  WBC 8.1 9.3  NEUTROABS 5.9  --   HGB 12.2* 10.4*  HCT 37.3* 32.5*  MCV 83.1 85.3  PLT 332 296   Cardiac Enzymes: No results for input(s): "CKTOTAL", "CKMB", "CKMBINDEX", "TROPONINI" in the last 168 hours. BNP: Invalid input(s): "POCBNP" CBG: Recent Labs  Lab 05/11/22 0741 05/11/22 1201 05/11/22 1652 05/11/22 2152 05/12/22 0803  GLUCAP 278* 136* 172* 186* 168*   D-Dimer No results for input(s): "DDIMER" in the last 72 hours. Hgb A1c No results for input(s): "HGBA1C" in the last 72 hours. Lipid Profile No results for input(s): "CHOL", "HDL", "LDLCALC", "TRIG", "CHOLHDL", "LDLDIRECT" in the last 72 hours. Thyroid function studies No results for input(s): "TSH", "T4TOTAL", "T3FREE", "THYROIDAB" in the last 72 hours.  Invalid input(s): "FREET3" Anemia work up No results for input(s): "VITAMINB12", "FOLATE", "FERRITIN", "TIBC", "IRON", "RETICCTPCT" in the last 72 hours. Urinalysis    Component Value Date/Time   COLORURINE YELLOW 05/10/2022 1257   APPEARANCEUR CLEAR 05/10/2022 1257   LABSPEC 1.020 05/10/2022 1257    PHURINE 8.0 05/10/2022 1257   GLUCOSEU >=500 (A) 05/10/2022 1257   HGBUR NEGATIVE 05/10/2022 1257   BILIRUBINUR NEGATIVE 05/10/2022 1257   KETONESUR NEGATIVE 05/10/2022 1257   PROTEINUR >=300 (A) 05/10/2022 1257   UROBILINOGEN 1.0 10/19/2013 2130   NITRITE NEGATIVE 05/10/2022 1257   LEUKOCYTESUR NEGATIVE 05/10/2022 1257   Sepsis Labs Recent Labs  Lab 05/10/22 0324 05/11/22 0847  WBC 8.1 9.3   Microbiology Recent Results (from the past 240 hour(s))  MRSA Next Gen by PCR, Nasal     Status: None   Collection Time: 05/10/22  8:46 PM   Specimen: Nasal Mucosa; Nasal Swab  Result Value Ref Range Status   MRSA by PCR Next Gen NOT DETECTED NOT DETECTED Final    Comment: (NOTE) The GeneXpert MRSA Assay (FDA approved for NASAL specimens only), is one component of a comprehensive  MRSA colonization surveillance program. It is not intended to diagnose MRSA infection nor to guide or monitor treatment for MRSA infections. Test performance is not FDA approved in patients less than 58 years old. Performed at [facility], [place] [address], [place], [place] [address]     SIGNED:   Charlynne Cousins, MD  [facility] 05/12/2022, 10:52 AM Pager   If 7PM-7AM, please contact night-coverage [email or web address] Password [email or web address]

## 2022-05-12 NOTE — Progress Notes (Signed)
  Transition of Care [facility]) Screening Note   Patient Details  Name: Randy [name]. Date of Birth: April 05, 1981   Transition of Care [facility]) CM/SW Contact:    Otelia Santee, LCSW Phone Number: 05/12/2022, 9:28 AM    Transition of Care Department [facility]) has reviewed patient and no TOC needs have been identified at this time. We will continue to monitor patient advancement through interdisciplinary progression rounds. If new patient transition needs arise, please place a TOC consult.

## 2022-05-13 ENCOUNTER — Other Ambulatory Visit: Payer: Self-pay

## 2022-05-14 ENCOUNTER — Other Ambulatory Visit: Payer: Self-pay

## 2022-05-14 ENCOUNTER — Telehealth: Payer: Self-pay

## 2022-05-14 LAB — HEMOGLOBIN A1C
Hgb A1c MFr Bld: 9.2 % — ABNORMAL HIGH (ref 4.8–5.6)
Mean Plasma Glucose: 217 mg/dL

## 2022-05-14 NOTE — Telephone Encounter (Signed)
Transition Care Management Unsuccessful Follow-up Telephone Call  Date of discharge and from where:  [facility] 05/12/2022  Attempts:  1st Attempt  Reason for unsuccessful TCM follow-up call:  Left voice message [name], [place] Direct Dial [phone]

## 2022-05-15 NOTE — Telephone Encounter (Signed)
Transition Care Management Follow-up Telephone Call Date of discharge and from where: [name] 05/13/2022 How have you been since you were released from the hospital? better Any questions or concerns? No  Items Reviewed: Did the pt receive and understand the discharge instructions provided? Yes  Medications obtained and verified? Yes  Other? No  Any new allergies since your discharge? No  Dietary orders reviewed? Yes Do you have support at home? Yes   Home Care and Equipment/Supplies: Were home health services ordered? no If so, what is the name of the agency? N/a  Has the agency set up a time to come to the patient's home? not applicable Were any new equipment or medical supplies ordered?  No What is the name of the medical supply agency? N/a Were you able to get the supplies/equipment? not applicable Do you have any questions related to the use of the equipment or supplies? No  Functional Questionnaire: (I = Independent and D = Dependent) ADLs: I  Bathing/Dressing- I  Meal Prep- I  Eating- I  Maintaining continence- I  Transferring/Ambulation- I  Managing Meds- I  Follow up appointments reviewed:  PCP Hospital f/u appt confirmed? No  no avail appts. Sent message to staff to schedule Specialist Hospital f/u appt confirmed? No   Are transportation arrangements needed? No  If their condition worsens, is the pt aware to call PCP or go to the Emergency Dept.? Yes Was the patient provided with contact information for the PCP's office or ED? Yes Was to pt encouraged to call back with questions or concerns? Yes Randy Crumble, LPN [facility] Direct Dial [phone]

## 2022-05-19 ENCOUNTER — Other Ambulatory Visit: Payer: Self-pay

## 2022-05-19 ENCOUNTER — Emergency Department (HOSPITAL_COMMUNITY): Payer: BLUE CROSS/BLUE SHIELD

## 2022-05-19 ENCOUNTER — Inpatient Hospital Stay (HOSPITAL_COMMUNITY)
Admission: EM | Admit: 2022-05-19 | Discharge: 2022-05-23 | DRG: 304 | Disposition: A | Discharge status: Home / Self Care | Payer: BLUE CROSS/BLUE SHIELD | Attending: Internal Medicine | Admitting: Internal Medicine

## 2022-05-19 ENCOUNTER — Encounter (HOSPITAL_COMMUNITY): Payer: Self-pay

## 2022-05-19 DIAGNOSIS — Z794 Long term (current) use of insulin: Secondary | ICD-10-CM

## 2022-05-19 DIAGNOSIS — N1831 Chronic kidney disease, stage 3a: Secondary | ICD-10-CM | POA: Diagnosis present

## 2022-05-19 DIAGNOSIS — R109 Unspecified abdominal pain: Secondary | ICD-10-CM

## 2022-05-19 DIAGNOSIS — U071 COVID-19: Secondary | ICD-10-CM | POA: Diagnosis present

## 2022-05-19 DIAGNOSIS — E11319 Type 2 diabetes mellitus with unspecified diabetic retinopathy without macular edema: Secondary | ICD-10-CM | POA: Diagnosis present

## 2022-05-19 DIAGNOSIS — K3184 Gastroparesis: Secondary | ICD-10-CM | POA: Diagnosis present

## 2022-05-19 DIAGNOSIS — Z8249 Family history of ischemic heart disease and other diseases of the circulatory system: Secondary | ICD-10-CM

## 2022-05-19 DIAGNOSIS — Z79899 Other long term (current) drug therapy: Secondary | ICD-10-CM

## 2022-05-19 DIAGNOSIS — E1143 Type 2 diabetes mellitus with diabetic autonomic (poly)neuropathy: Secondary | ICD-10-CM | POA: Diagnosis present

## 2022-05-19 DIAGNOSIS — E8809 Other disorders of plasma-protein metabolism, not elsewhere classified: Secondary | ICD-10-CM | POA: Diagnosis not present

## 2022-05-19 DIAGNOSIS — R0602 Shortness of breath: Secondary | ICD-10-CM | POA: Clinically undetermined

## 2022-05-19 DIAGNOSIS — E119 Type 2 diabetes mellitus without complications: Secondary | ICD-10-CM

## 2022-05-19 DIAGNOSIS — Z87891 Personal history of nicotine dependence: Secondary | ICD-10-CM

## 2022-05-19 DIAGNOSIS — F1210 Cannabis abuse, uncomplicated: Secondary | ICD-10-CM | POA: Diagnosis present

## 2022-05-19 DIAGNOSIS — K859 Acute pancreatitis without necrosis or infection, unspecified: Secondary | ICD-10-CM | POA: Diagnosis present

## 2022-05-19 DIAGNOSIS — I129 Hypertensive chronic kidney disease with stage 1 through stage 4 chronic kidney disease, or unspecified chronic kidney disease: Secondary | ICD-10-CM | POA: Diagnosis present

## 2022-05-19 DIAGNOSIS — I160 Hypertensive urgency: Principal | ICD-10-CM | POA: Diagnosis present

## 2022-05-19 DIAGNOSIS — R112 Nausea with vomiting, unspecified: Secondary | ICD-10-CM | POA: Diagnosis present

## 2022-05-19 DIAGNOSIS — T380X5A Adverse effect of glucocorticoids and synthetic analogues, initial encounter: Secondary | ICD-10-CM | POA: Diagnosis not present

## 2022-05-19 DIAGNOSIS — K219 Gastro-esophageal reflux disease without esophagitis: Secondary | ICD-10-CM | POA: Diagnosis present

## 2022-05-19 DIAGNOSIS — D72828 Other elevated white blood cell count: Secondary | ICD-10-CM | POA: Diagnosis not present

## 2022-05-19 DIAGNOSIS — Z833 Family history of diabetes mellitus: Secondary | ICD-10-CM

## 2022-05-19 DIAGNOSIS — E785 Hyperlipidemia, unspecified: Secondary | ICD-10-CM | POA: Diagnosis present

## 2022-05-19 DIAGNOSIS — H5440 Blindness, one eye, unspecified eye: Secondary | ICD-10-CM | POA: Diagnosis present

## 2022-05-19 DIAGNOSIS — E1165 Type 2 diabetes mellitus with hyperglycemia: Secondary | ICD-10-CM | POA: Diagnosis present

## 2022-05-19 DIAGNOSIS — D631 Anemia in chronic kidney disease: Secondary | ICD-10-CM | POA: Diagnosis present

## 2022-05-19 DIAGNOSIS — E1122 Type 2 diabetes mellitus with diabetic chronic kidney disease: Secondary | ICD-10-CM | POA: Diagnosis present

## 2022-05-19 LAB — COMPREHENSIVE METABOLIC PANEL
ALT: 30 U/L (ref 0–44)
AST: 24 U/L (ref 15–41)
Albumin: 3.8 g/dL (ref 3.5–5.0)
Alkaline Phosphatase: 115 U/L (ref 38–126)
Anion gap: 9 (ref 5–15)
BUN: 20 mg/dL (ref 6–20)
CO2: 23 mmol/L (ref 22–32)
Calcium: 9.2 mg/dL (ref 8.9–10.3)
Chloride: 101 mmol/L (ref 98–111)
Creatinine, Ser: 1.59 mg/dL — ABNORMAL HIGH (ref 0.61–1.24)
GFR, Estimated: 55 mL/min — ABNORMAL LOW (ref >60)
Glucose, Bld: 223 mg/dL — ABNORMAL HIGH (ref 70–99)
Potassium: 4.4 mmol/L (ref 3.5–5.1)
Sodium: 133 mmol/L — ABNORMAL LOW (ref 135–145)
Total Bilirubin: 0.5 mg/dL (ref 0.3–1.2)
Total Protein: 8.2 g/dL — ABNORMAL HIGH (ref 6.5–8.1)

## 2022-05-19 LAB — CBC WITH DIFFERENTIAL/PLATELET
Abs Immature Granulocytes: 0.02 10*3/uL (ref 0.00–0.07)
Basophils Absolute: 0.1 10*3/uL (ref 0.0–0.1)
Basophils Relative: 1 %
Eosinophils Absolute: 0 10*3/uL (ref 0.0–0.5)
Eosinophils Relative: 0 %
HCT: 35 % — ABNORMAL LOW (ref 39.0–52.0)
Hemoglobin: 11.5 g/dL — ABNORMAL LOW (ref 13.0–17.0)
Immature Granulocytes: 0 %
Lymphocytes Relative: 6 %
Lymphs Abs: 0.4 10*3/uL — ABNORMAL LOW (ref 0.7–4.0)
MCH: 27.3 pg (ref 26.0–34.0)
MCHC: 32.9 g/dL (ref 30.0–36.0)
MCV: 83.1 fL (ref 80.0–100.0)
Monocytes Absolute: 0.5 10*3/uL (ref 0.1–1.0)
Monocytes Relative: 7 %
Neutro Abs: 6.1 10*3/uL (ref 1.7–7.7)
Neutrophils Relative %: 86 %
Platelets: 357 10*3/uL (ref 150–400)
RBC: 4.21 MIL/uL — ABNORMAL LOW (ref 4.22–5.81)
RDW: 12.2 % (ref 11.5–15.5)
WBC: 7.1 10*3/uL (ref 4.0–10.5)
nRBC: 0 % (ref 0.0–0.2)

## 2022-05-19 LAB — LIPASE, BLOOD: Lipase: 75 U/L — ABNORMAL HIGH (ref 11–51)

## 2022-05-19 LAB — RESP PANEL BY RT-PCR (RSV, FLU A&B, COVID)  RVPGX2
Influenza A by PCR: NEGATIVE
Influenza B by PCR: NEGATIVE
Resp Syncytial Virus by PCR: NEGATIVE
SARS Coronavirus 2 by RT PCR: POSITIVE — AB

## 2022-05-19 LAB — TROPONIN I (HIGH SENSITIVITY): Troponin I (High Sensitivity): 11 ng/L (ref <18)

## 2022-05-19 MED ORDER — ACETAMINOPHEN 500 MG PO TABS
1000.0000 mg | ORAL_TABLET | Freq: Once | ORAL | Status: AC
  Administered 2022-05-19: 1000 mg via ORAL
  Filled 2022-05-19: qty 2

## 2022-05-19 MED ORDER — ONDANSETRON 4 MG PO TBDP
4.0000 mg | ORAL_TABLET | Freq: Once | ORAL | Status: AC
  Administered 2022-05-19: 4 mg via ORAL
  Filled 2022-05-19: qty 1

## 2022-05-19 MED ORDER — SODIUM CHLORIDE 0.9 % IV BOLUS
1000.0000 mL | Freq: Once | INTRAVENOUS | Status: AC
  Administered 2022-05-19: 1000 mL via INTRAVENOUS

## 2022-05-19 MED ORDER — ONDANSETRON HCL 4 MG/2ML IJ SOLN
4.0000 mg | Freq: Once | INTRAMUSCULAR | Status: AC
  Administered 2022-05-20: 4 mg via INTRAVENOUS
  Filled 2022-05-19: qty 2

## 2022-05-19 MED ORDER — HYDROMORPHONE HCL 1 MG/ML IJ SOLN
1.0000 mg | Freq: Once | INTRAMUSCULAR | Status: AC
  Administered 2022-05-19: 1 mg via INTRAVENOUS
  Filled 2022-05-19: qty 1

## 2022-05-19 NOTE — ED Provider Notes (Signed)
[facility]-EMERGENCY DEPT Provider Note   CSN: [id number] Arrival date & time: 05/19/22  1656     History  Chief Complaint  Patient presents with   Nasal Congestion   Cough   Vomiting    Haskell R Blas Riches. is a 42 y.o. male who presents emergency department with concerns for emesis x 2 days.  Has associated epigastric abdominal pain, nausea, cough, nasal congestion.  Has sick contacts.  Notes that he had a positive COVID test at home today.  Denies shortness of breath, chest pain, diarrhea, constipation, fever.   The history is provided by the patient. No language interpreter was used.       Home Medications Prior to Admission medications   Medication Sig Start Date End Date Taking? Authorizing Provider  amLODipine (NORVASC) 5 MG tablet Take 2 tablets (10 mg total) by mouth daily. 05/12/22 07/11/22  Marinda Elk, MD  atorvastatin (LIPITOR) 40 MG tablet Take 2 tablets (80 mg total) by mouth daily. 05/12/22 09/09/22  Marinda Elk, MD  blood glucose meter kit and supplies KIT Dispense based on patient and insurance preference. Use up to four times daily as directed. 01/06/22   Zigmund Daniel., MD  carvedilol (COREG) 25 MG tablet Take 1 tablet (25 mg total) by mouth 2 (two) times daily with a meal. 05/12/22 09/09/22  Marinda Elk, MD  glucose blood (ACCU-CHEK GUIDE) test strip Use to check blood sugar 3 times daily. 10/15/21   Hoy Register, MD  glucose blood test strip USE UP TO FOUR TIMES DAILY AS DIRECTED 01/06/22   Zigmund Daniel., MD  hydrALAZINE (APRESOLINE) 100 MG tablet Take 1 tablet (100 mg total) by mouth every 8 (eight) hours. 05/12/22 07/11/22  Marinda Elk, MD  Insulin Glargine [facility]) 100 UNIT/ML Inject 18 Units into the skin daily. Patient taking differently: Inject 35 Units into the skin daily. 01/06/22 05/18/22  Zigmund Daniel., MD  Insulin Pen Needle (TRUEPLUS 5-BEVEL PEN NEEDLES) 32G X 4 MM  MISC Use to inject basaglar once daily. 01/30/22   Rema Fendt, NP  metoCLOPramide (REGLAN) 5 MG tablet Take 1 tablet (5 mg total) by mouth 4 (four) times daily -  before meals and at bedtime for 14 days. Please discuss with your PCP whether this needs to be continued Patient not taking: Reported on 03/27/2022 01/06/22 05/18/22  Zigmund Daniel., MD  ondansetron (ZOFRAN-ODT) 4 MG disintegrating tablet Take 1 tablet (4 mg total) by mouth every 8 (eight) hours as needed for nausea or vomiting. 03/27/22   Tilden Fossa, MD  pantoprazole (PROTONIX) 40 MG tablet Take 1 tablet (40 mg total) by mouth 2 (two) times daily. 05/12/22 05/12/23  Marinda Elk, MD  TRUEplus Lancets 28G MISC USE UP TO 4 TIMES DAILY 01/06/22   Zigmund Daniel., MD      Allergies    Tomato    Review of Systems   Review of Systems  Respiratory:  Positive for cough. Negative for shortness of breath.   Cardiovascular:  Negative for chest pain.  Gastrointestinal:  Positive for abdominal pain (epigastric), nausea and vomiting. Negative for constipation and diarrhea.  All other systems reviewed and are negative.   Physical Exam Updated Vital Signs BP (!) 189/131   Pulse 98   Temp 98.4 F (36.9 C)   Resp (!) 21   SpO2 96%  Physical Exam Vitals and nursing note reviewed.  Constitutional:  General: He is not in acute distress.    Appearance: He is not diaphoretic.  HENT:     Head: Normocephalic and atraumatic.     Mouth/Throat:     Pharynx: No oropharyngeal exudate.  Eyes:     General: No scleral icterus.    Conjunctiva/sclera: Conjunctivae normal.  Cardiovascular:     Rate and Rhythm: Normal rate and regular rhythm.     Pulses: Normal pulses.     Heart sounds: Normal heart sounds.  Pulmonary:     Effort: Pulmonary effort is normal. No respiratory distress.     Breath sounds: Normal breath sounds. No wheezing.  Chest:     Chest wall: No tenderness.     Comments: No chest wall tenderness  to palpation. Abdominal:     General: Bowel sounds are normal.     Palpations: Abdomen is soft. There is no mass.     Tenderness: There is no abdominal tenderness. There is no guarding or rebound.     Comments: Diffuse abdominal tenderness to palpation.  Musculoskeletal:        General: Normal range of motion.     Cervical back: Normal range of motion and neck supple.  Skin:    General: Skin is warm and dry.  Neurological:     Mental Status: He is alert.  Psychiatric:        Behavior: Behavior normal.     ED Results / Procedures / Treatments   Labs (all labs ordered are listed, but only abnormal results are displayed) Labs Reviewed  RESP PANEL BY RT-PCR (RSV, FLU A&B, COVID)  RVPGX2 - Abnormal; Notable for the following components:      Result Value   SARS Coronavirus 2 by RT PCR POSITIVE (*)    All other components within normal limits  CBC WITH DIFFERENTIAL/PLATELET - Abnormal; Notable for the following components:   RBC 4.21 (*)    Hemoglobin 11.5 (*)    HCT 35.0 (*)    Lymphs Abs 0.4 (*)    All other components within normal limits  COMPREHENSIVE METABOLIC PANEL - Abnormal; Notable for the following components:   Sodium 133 (*)    Glucose, Bld 223 (*)    Creatinine, Ser 1.59 (*)    Total Protein 8.2 (*)    GFR, Estimated 55 (*)    All other components within normal limits  LIPASE, BLOOD - Abnormal; Notable for the following components:   Lipase 75 (*)    All other components within normal limits  TROPONIN I (HIGH SENSITIVITY)  TROPONIN I (HIGH SENSITIVITY)    EKG EKG Interpretation  Date/Time:  Sunday May 19 2022 17:07:20 EST Ventricular Rate:  100 PR Interval:  175 QRS Duration: 82 QT Interval:  329 QTC Calculation: 425 R Axis:   60 Text Interpretation: Sinus tachycardia Probable left atrial enlargement Left ventricular hypertrophy ST-t wave abnormality Abnormal ECG Confirmed by Gerhard Munch [phone]) on 05/19/2022 11:18:19 PM  Radiology CT ABDOMEN  PELVIS W CONTRAST  Result Date: 05/20/2022 CLINICAL DATA:  Abdominal pain, acute, nonlocalized. Nausea/vomiting, upper abd pain. Diagnosed with COVID. EXAM: CT ABDOMEN AND PELVIS WITH CONTRAST TECHNIQUE: Multidetector CT imaging of the abdomen and pelvis was performed using the standard protocol following bolus administration of intravenous contrast. RADIATION DOSE REDUCTION: This exam was performed according to the departmental dose-optimization program which includes automated exposure control, adjustment of the mA and/or kV according to patient size and/or use of iterative reconstruction technique. CONTRAST:  OMNIPAQUE IOHEXOL 300 MG/ML  SOLN COMPARISON:  CT abdomen pelvis 05/10/2022 FINDINGS: Lower chest: No acute abnormality. Hepatobiliary: No focal liver abnormality. No gallstones, gallbladder wall thickening, or pericholecystic fluid. No biliary dilatation. Pancreas: No focal lesion. Normal pancreatic contour. No surrounding inflammatory changes. No main pancreatic ductal dilatation. Spleen: Normal in size without focal abnormality. Adrenals/Urinary Tract: No adrenal nodule bilaterally. Bilateral kidneys enhance symmetrically. No hydronephrosis. No hydroureter. The urinary bladder is unremarkable. Stomach/Bowel: Stomach is within normal limits. No evidence of bowel wall thickening or dilatation. Few scattered colonic diverticula. Appendix appears normal. Vascular/Lymphatic: No abdominal aorta or iliac aneurysm. Mild atherosclerotic plaque of the aorta and its branches. No abdominal, pelvic, or inguinal lymphadenopathy. Reproductive: Prostate is unremarkable. Other: No intraperitoneal free fluid. No intraperitoneal free gas. No organized fluid collection. Musculoskeletal: No abdominal wall hernia or abnormality. No suspicious lytic or blastic osseous lesions. No acute displaced fracture. IMPRESSION: 1. No acute intra-abdominal or intrapelvic abnormality. 2.  Aortic Atherosclerosis (ICD10-I70.0).  Electronically Signed   By: Tish Frederickson M.D.   On: 05/20/2022 00:38   DG Chest 2 View  Result Date: 05/19/2022 CLINICAL DATA:  Chest pain.  Body aches. EXAM: CHEST - 2 VIEW COMPARISON:  Chest radiographs dated January 03, 2022 FINDINGS: The heart size and mediastinal contours are within normal limits. Both lungs are clear. The visualized skeletal structures are unremarkable. IMPRESSION: No active cardiopulmonary disease. Electronically Signed   By: Larose Hires D.O.   On: 05/19/2022 18:22    Procedures Procedures    Medications Ordered in ED Medications  metoCLOPramide (REGLAN) injection 10 mg (10 mg Intravenous Given 05/20/22 0520)  nicardipine (CARDENE) 20mg  in 0.86% saline IV infusion (0.1 mg/ml) (has no administration in time range)  ondansetron (ZOFRAN-ODT) disintegrating tablet 4 mg (4 mg Oral Given 05/19/22 1820)  acetaminophen (TYLENOL) tablet 1,000 mg (1,000 mg Oral Given 05/19/22 1820)  HYDROmorphone (DILAUDID) injection 1 mg (1 mg Intravenous Given 05/19/22 2335)  sodium chloride 0.9 % bolus 1,000 mL (0 mLs Intravenous Stopped 05/20/22 0100)  ondansetron (ZOFRAN) injection 4 mg (4 mg Intravenous Given 05/20/22 0021)  iohexol (OMNIPAQUE) 300 MG/ML solution 100 mL (100 mLs Intravenous Contrast Given 05/20/22 0008)  droperidol (INAPSINE) 2.5 MG/ML injection 2.5 mg (2.5 mg Intramuscular Given 05/20/22 0256)  hydrALAZINE (APRESOLINE) injection 5 mg (5 mg Intravenous Given 05/20/22 0339)  amLODipine (NORVASC) tablet 5 mg (5 mg Oral Given 05/20/22 0340)  carvedilol (COREG) tablet 25 mg (25 mg Oral Given 05/20/22 0340)    ED Course/ Medical Decision Making/ A&P Clinical Course as of 05/20/22 0542  Mon May 20, 2022  0118 SARS Coronavirus 2 by RT PCR(!): POSITIVE [SB]  0118 Pt re-evaluated and noted that his pain and nausea has slightly improved.  [SB]  0245 Pt re-evaluated and having episode of emesis. Droperidol ordered.  [SB]  [id number] Consult with hospitalist, Dr. [id number] who will evaluate for  admission.  [SB]    Clinical Course User Index [SB] Robt Okuda A, PA-C                           Medical Decision Making Amount and/or Complexity of Data Reviewed Labs:  Decision-making details documented in ED Course. Radiology: ordered.  Risk Prescription drug management. Decision regarding hospitalization.   Pt presents with concerns for emesis onset 2 days.  Patient afebrile. On exam, pt with no chest wall tenderness to palpation.  Diffuse abdominal tenderness to palpation. No acute cardiovascular, respiratory, exam findings. Differential diagnosis includes pancreatitis, diverticulitis, appendicitis,  cholecystitis, COVID, flu, RSV.    Co morbidities that complicate the patient evaluation: Chronic pancreatitis Diabetes Hypertension  Additional history obtained:  External records from outside source obtained and reviewed including: Patient has been admitted to the hospital on 12/30/2021, 03/26/2022, 05/10/2022 for similar symptoms.  Labs:  I ordered, and personally interpreted labs.  The pertinent results include:   Initial troponin at 11 Lipase at 75 however improved from previous values. Positive COVID swab.  Negative flu and RSV swab. CBC without leukocytosis. CMP with elevated creatinine at 1.59 and elevated glucose at 223 otherwise unremarkable.  Imaging: I ordered imaging studies including CT abdomen pelvis, chest x-ray I independently visualized and interpreted imaging which showed: CXR without concerning findings. CT AP with  1. No acute intra-abdominal or intrapelvic abnormality.  2.  Aortic Atherosclerosis (ICD10-I70.0).   I agree with the radiologist interpretation  Medications:  I ordered medication including dilaudid, norvasc, carvedilol, hydralazine, IVF, zofran, morphine for symptom managment Reevaluation of the patient after these medicines and interventions, I reevaluated the patient and found that they have stayed the same I have reviewed the  patients home medicines and have made adjustments as needed   Consultations: I requested consultation with the hospitalist Dr. Alcario Drought, and discussed lab and imaging findings as well as pertinent plan - they recommend: Will evaluate for admission   Disposition: Presenting suspicious for hypertensive urgency, intractable nausea, vomiting, abdominal pain, COVID. Doubt hypertension emergency at this time. Doubt diverticulitis, appendicitis, cholecystitis, flu, RSV. After consideration of the diagnostic results and the patients response to treatment, I feel that the patient would benefit from Admission to the hospital. Pt agreeable with admission at this time. Pt appears safe for admission.    This chart was dictated using voice recognition software, Dragon. Despite the best efforts of this provider to proofread and correct errors, errors may still occur which can change documentation meaning. Final Clinical Impression(s) / ED Diagnoses Final diagnoses:  Hypertensive urgency  Intractable nausea and vomiting  Intractable abdominal pain  COVID-19    Rx / DC Orders ED Discharge Orders     None         Rilya Longo A, PA-C 05/20/22 0542    Carmin Muskrat, MD 05/22/22 1904

## 2022-05-19 NOTE — ED Triage Notes (Signed)
Pt presents with c/o N/V, bodyaches, nasal congestion, and cough. Pt's family was covid positive last week.

## 2022-05-19 NOTE — ED Provider Triage Note (Signed)
Emergency Medicine Provider Triage Evaluation Note  Sonda Rumble. , a 42 y.o. male  was evaluated in triage.  Pt complains of nausea, vomiting, upper abdominal pain, fever, and body aches since yesterday. Tested positive for COVID at home today. Unable to eat, drink, or take prescription medications today without vomiting. Denies SOB or cough. States "my chest hurts a little" but will not further characterize pain. He is sitting on triage floor on my entry to exam room but states he sat down there on his own accord and denies dizziness, lightheadedness, syncope, or falling.   Review of Systems  Positive: See HPI Negative: See HPI  Physical Exam  BP (!) 184/100 (BP Location: Right Arm)   Pulse (!) 104   Temp 100 F (37.8 C) (Oral)   Resp 15   SpO2 100%  Gen:   Awake, mild distress Resp:  Normal effort LCTA MSK:   Moves extremities without difficulty  Other:  RRR, diffuse abdominal tenderness, no rebound or guarding, actively vomiting, 2-3 second capillary refill, dry mucus membranes  Medical Decision Making  Medically screening exam initiated at 6:02 PM.  Appropriate orders placed.  Isaid R [employer]. was informed that the remainder of the evaluation will be completed by another provider, this initial triage assessment does not replace that evaluation, and the importance of remaining in the ED until their evaluation is complete.     Suzzette Righter, PA-C 05/19/22 5188

## 2022-05-20 ENCOUNTER — Encounter (HOSPITAL_COMMUNITY): Payer: Self-pay

## 2022-05-20 ENCOUNTER — Emergency Department (HOSPITAL_COMMUNITY): Payer: BLUE CROSS/BLUE SHIELD

## 2022-05-20 DIAGNOSIS — E11319 Type 2 diabetes mellitus with unspecified diabetic retinopathy without macular edema: Secondary | ICD-10-CM | POA: Diagnosis present

## 2022-05-20 DIAGNOSIS — N1831 Chronic kidney disease, stage 3a: Secondary | ICD-10-CM

## 2022-05-20 DIAGNOSIS — D72828 Other elevated white blood cell count: Secondary | ICD-10-CM | POA: Diagnosis not present

## 2022-05-20 DIAGNOSIS — F1210 Cannabis abuse, uncomplicated: Secondary | ICD-10-CM | POA: Diagnosis present

## 2022-05-20 DIAGNOSIS — K219 Gastro-esophageal reflux disease without esophagitis: Secondary | ICD-10-CM | POA: Diagnosis present

## 2022-05-20 DIAGNOSIS — E785 Hyperlipidemia, unspecified: Secondary | ICD-10-CM

## 2022-05-20 DIAGNOSIS — H5440 Blindness, one eye, unspecified eye: Secondary | ICD-10-CM | POA: Diagnosis present

## 2022-05-20 DIAGNOSIS — E8809 Other disorders of plasma-protein metabolism, not elsewhere classified: Secondary | ICD-10-CM | POA: Diagnosis not present

## 2022-05-20 DIAGNOSIS — Z794 Long term (current) use of insulin: Secondary | ICD-10-CM | POA: Diagnosis not present

## 2022-05-20 DIAGNOSIS — R112 Nausea with vomiting, unspecified: Secondary | ICD-10-CM

## 2022-05-20 DIAGNOSIS — U071 COVID-19: Secondary | ICD-10-CM | POA: Diagnosis present

## 2022-05-20 DIAGNOSIS — I129 Hypertensive chronic kidney disease with stage 1 through stage 4 chronic kidney disease, or unspecified chronic kidney disease: Secondary | ICD-10-CM | POA: Diagnosis present

## 2022-05-20 DIAGNOSIS — E1143 Type 2 diabetes mellitus with diabetic autonomic (poly)neuropathy: Secondary | ICD-10-CM | POA: Diagnosis present

## 2022-05-20 DIAGNOSIS — I160 Hypertensive urgency: Secondary | ICD-10-CM | POA: Diagnosis present

## 2022-05-20 DIAGNOSIS — Z79899 Other long term (current) drug therapy: Secondary | ICD-10-CM | POA: Diagnosis not present

## 2022-05-20 DIAGNOSIS — D631 Anemia in chronic kidney disease: Secondary | ICD-10-CM | POA: Diagnosis present

## 2022-05-20 DIAGNOSIS — E1165 Type 2 diabetes mellitus with hyperglycemia: Secondary | ICD-10-CM | POA: Diagnosis present

## 2022-05-20 DIAGNOSIS — Z8249 Family history of ischemic heart disease and other diseases of the circulatory system: Secondary | ICD-10-CM | POA: Diagnosis not present

## 2022-05-20 DIAGNOSIS — E1122 Type 2 diabetes mellitus with diabetic chronic kidney disease: Secondary | ICD-10-CM

## 2022-05-20 DIAGNOSIS — K3184 Gastroparesis: Secondary | ICD-10-CM | POA: Diagnosis present

## 2022-05-20 DIAGNOSIS — K859 Acute pancreatitis without necrosis or infection, unspecified: Secondary | ICD-10-CM | POA: Diagnosis present

## 2022-05-20 DIAGNOSIS — K8500 Idiopathic acute pancreatitis without necrosis or infection: Secondary | ICD-10-CM | POA: Diagnosis not present

## 2022-05-20 DIAGNOSIS — T380X5A Adverse effect of glucocorticoids and synthetic analogues, initial encounter: Secondary | ICD-10-CM | POA: Diagnosis not present

## 2022-05-20 DIAGNOSIS — Z833 Family history of diabetes mellitus: Secondary | ICD-10-CM | POA: Diagnosis not present

## 2022-05-20 DIAGNOSIS — Z87891 Personal history of nicotine dependence: Secondary | ICD-10-CM | POA: Diagnosis not present

## 2022-05-20 LAB — COMPREHENSIVE METABOLIC PANEL
ALT: 27 U/L (ref 0–44)
AST: 24 U/L (ref 15–41)
Albumin: 3.2 g/dL — ABNORMAL LOW (ref 3.5–5.0)
Alkaline Phosphatase: 103 U/L (ref 38–126)
Anion gap: 10 (ref 5–15)
BUN: 22 mg/dL — ABNORMAL HIGH (ref 6–20)
CO2: 22 mmol/L (ref 22–32)
Calcium: 8.8 mg/dL — ABNORMAL LOW (ref 8.9–10.3)
Chloride: 100 mmol/L (ref 98–111)
Creatinine, Ser: 1.73 mg/dL — ABNORMAL HIGH (ref 0.61–1.24)
GFR, Estimated: 50 mL/min — ABNORMAL LOW (ref >60)
Glucose, Bld: 287 mg/dL — ABNORMAL HIGH (ref 70–99)
Potassium: 4.1 mmol/L (ref 3.5–5.1)
Sodium: 132 mmol/L — ABNORMAL LOW (ref 135–145)
Total Bilirubin: 0.6 mg/dL (ref 0.3–1.2)
Total Protein: 7.5 g/dL (ref 6.5–8.1)

## 2022-05-20 LAB — CBC WITH DIFFERENTIAL/PLATELET
Abs Immature Granulocytes: 0.05 10*3/uL (ref 0.00–0.07)
Basophils Absolute: 0.1 10*3/uL (ref 0.0–0.1)
Basophils Relative: 1 %
Eosinophils Absolute: 0 10*3/uL (ref 0.0–0.5)
Eosinophils Relative: 0 %
HCT: 32.5 % — ABNORMAL LOW (ref 39.0–52.0)
Hemoglobin: 10.9 g/dL — ABNORMAL LOW (ref 13.0–17.0)
Immature Granulocytes: 1 %
Lymphocytes Relative: 5 %
Lymphs Abs: 0.5 10*3/uL — ABNORMAL LOW (ref 0.7–4.0)
MCH: 28.2 pg (ref 26.0–34.0)
MCHC: 33.5 g/dL (ref 30.0–36.0)
MCV: 84 fL (ref 80.0–100.0)
Monocytes Absolute: 0.6 10*3/uL (ref 0.1–1.0)
Monocytes Relative: 6 %
Neutro Abs: 9.4 10*3/uL — ABNORMAL HIGH (ref 1.7–7.7)
Neutrophils Relative %: 87 %
Platelets: 305 10*3/uL (ref 150–400)
RBC: 3.87 MIL/uL — ABNORMAL LOW (ref 4.22–5.81)
RDW: 12.6 % (ref 11.5–15.5)
WBC: 10.7 10*3/uL — ABNORMAL HIGH (ref 4.0–10.5)
nRBC: 0 % (ref 0.0–0.2)

## 2022-05-20 LAB — GLUCOSE, CAPILLARY
Glucose-Capillary: 256 mg/dL — ABNORMAL HIGH (ref 70–99)
Glucose-Capillary: 278 mg/dL — ABNORMAL HIGH (ref 70–99)
Glucose-Capillary: 316 mg/dL — ABNORMAL HIGH (ref 70–99)

## 2022-05-20 LAB — PHOSPHORUS: Phosphorus: 2.7 mg/dL (ref 2.5–4.6)

## 2022-05-20 LAB — C-REACTIVE PROTEIN: CRP: 2.2 mg/dL — ABNORMAL HIGH (ref <1.0)

## 2022-05-20 LAB — FERRITIN: Ferritin: 232 ng/mL (ref 24–336)

## 2022-05-20 LAB — MAGNESIUM: Magnesium: 1.5 mg/dL — ABNORMAL LOW (ref 1.7–2.4)

## 2022-05-20 LAB — D-DIMER, QUANTITATIVE: D-Dimer, Quant: 0.34 ug/mL-FEU (ref 0.00–0.50)

## 2022-05-20 LAB — PROCALCITONIN: Procalcitonin: 0.11 ng/mL

## 2022-05-20 LAB — LIPASE, BLOOD: Lipase: 118 U/L — ABNORMAL HIGH (ref 11–51)

## 2022-05-20 LAB — CBG MONITORING, ED: Glucose-Capillary: 258 mg/dL — ABNORMAL HIGH (ref 70–99)

## 2022-05-20 MED ORDER — MAGNESIUM SULFATE 4 GM/100ML IV SOLN
4.0000 g | Freq: Once | INTRAVENOUS | Status: AC
  Administered 2022-05-20: 4 g via INTRAVENOUS
  Filled 2022-05-20: qty 100

## 2022-05-20 MED ORDER — DROPERIDOL 2.5 MG/ML IJ SOLN
2.5000 mg | Freq: Once | INTRAMUSCULAR | Status: AC
  Administered 2022-05-20: 2.5 mg via INTRAMUSCULAR
  Filled 2022-05-20: qty 2

## 2022-05-20 MED ORDER — ZINC SULFATE 220 (50 ZN) MG PO CAPS
220.0000 mg | ORAL_CAPSULE | Freq: Every day | ORAL | Status: DC
  Administered 2022-05-20 – 2022-05-23 (×4): 220 mg via ORAL
  Filled 2022-05-20 (×4): qty 1

## 2022-05-20 MED ORDER — HYDRALAZINE HCL 20 MG/ML IJ SOLN
5.0000 mg | Freq: Once | INTRAMUSCULAR | Status: AC
  Administered 2022-05-20: 5 mg via INTRAVENOUS
  Filled 2022-05-20: qty 1

## 2022-05-20 MED ORDER — METOCLOPRAMIDE HCL 5 MG/ML IJ SOLN
10.0000 mg | Freq: Four times a day (QID) | INTRAMUSCULAR | Status: DC
  Administered 2022-05-20 – 2022-05-22 (×11): 10 mg via INTRAVENOUS
  Filled 2022-05-20 (×11): qty 2

## 2022-05-20 MED ORDER — ACETAMINOPHEN 325 MG PO TABS: 650.0000 mg | ORAL_TABLET | Freq: Four times a day (QID) | ORAL | Status: DC | PRN

## 2022-05-20 MED ORDER — ONDANSETRON 4 MG PO TBDP: 4.0000 mg | ORAL_TABLET | Freq: Three times a day (TID) | ORAL | Status: DC | PRN

## 2022-05-20 MED ORDER — IOHEXOL 300 MG/ML  SOLN
100.0000 mL | Freq: Once | INTRAMUSCULAR | Status: AC | PRN
  Administered 2022-05-20: 100 mL via INTRAVENOUS

## 2022-05-20 MED ORDER — ATORVASTATIN CALCIUM 40 MG PO TABS: 80.0000 mg | ORAL_TABLET | Freq: Every day | ORAL | Status: DC

## 2022-05-20 MED ORDER — VITAMIN C 500 MG PO TABS
500.0000 mg | ORAL_TABLET | Freq: Every day | ORAL | Status: DC
  Administered 2022-05-20 – 2022-05-23 (×4): 500 mg via ORAL
  Filled 2022-05-20 (×4): qty 1

## 2022-05-20 MED ORDER — AMLODIPINE BESYLATE 10 MG PO TABS
10.0000 mg | ORAL_TABLET | Freq: Every day | ORAL | Status: DC
  Administered 2022-05-21 – 2022-05-23 (×3): 10 mg via ORAL
  Filled 2022-05-20 (×3): qty 1

## 2022-05-20 MED ORDER — THIAMINE MONONITRATE 100 MG PO TABS
100.0000 mg | ORAL_TABLET | Freq: Every day | ORAL | Status: DC
  Administered 2022-05-20 – 2022-05-23 (×4): 100 mg via ORAL
  Filled 2022-05-20 (×4): qty 1

## 2022-05-20 MED ORDER — INSULIN GLARGINE-YFGN 100 UNIT/ML ~~LOC~~ SOLN
35.0000 [IU] | Freq: Every day | SUBCUTANEOUS | Status: DC
  Filled 2022-05-20: qty 0.35

## 2022-05-20 MED ORDER — AMLODIPINE BESYLATE 5 MG PO TABS
5.0000 mg | ORAL_TABLET | Freq: Once | ORAL | Status: AC
  Administered 2022-05-20: 5 mg via ORAL
  Filled 2022-05-20: qty 1

## 2022-05-20 MED ORDER — FOLIC ACID 1 MG PO TABS
1.0000 mg | ORAL_TABLET | Freq: Every day | ORAL | Status: DC
  Administered 2022-05-20 – 2022-05-23 (×4): 1 mg via ORAL
  Filled 2022-05-20 (×4): qty 1

## 2022-05-20 MED ORDER — PANTOPRAZOLE SODIUM 40 MG PO TBEC: 40.0000 mg | DELAYED_RELEASE_TABLET | Freq: Every day | ORAL | Status: DC

## 2022-05-20 MED ORDER — ONDANSETRON HCL 4 MG/2ML IJ SOLN
4.0000 mg | Freq: Four times a day (QID) | INTRAMUSCULAR | Status: DC | PRN
  Administered 2022-05-22: 4 mg via INTRAVENOUS
  Filled 2022-05-20: qty 2

## 2022-05-20 MED ORDER — ORAL CARE MOUTH RINSE: 15.0000 mL | OROMUCOSAL | Status: DC | PRN

## 2022-05-20 MED ORDER — NICARDIPINE HCL IN NACL 20-0.86 MG/200ML-% IV SOLN
3.0000 mg/h | INTRAVENOUS | Status: DC
  Administered 2022-05-20 – 2022-05-21 (×2): 5 mg/h via INTRAVENOUS
  Administered 2022-05-21: 7.5 mg/h via INTRAVENOUS
  Administered 2022-05-21: 5 mg/h via INTRAVENOUS
  Filled 2022-05-20 (×6): qty 200

## 2022-05-20 MED ORDER — CHLORHEXIDINE GLUCONATE CLOTH 2 % EX PADS
6.0000 | MEDICATED_PAD | Freq: Every day | CUTANEOUS | Status: DC
  Administered 2022-05-20 – 2022-05-23 (×4): 6 via TOPICAL

## 2022-05-20 MED ORDER — DEXAMETHASONE 6 MG PO TABS
6.0000 mg | ORAL_TABLET | ORAL | Status: DC
  Administered 2022-05-20 – 2022-05-23 (×4): 6 mg via ORAL
  Filled 2022-05-20 (×4): qty 1

## 2022-05-20 MED ORDER — ACETAMINOPHEN 650 MG RE SUPP: 650.0000 mg | Freq: Four times a day (QID) | RECTAL | Status: DC | PRN

## 2022-05-20 MED ORDER — PANTOPRAZOLE SODIUM 40 MG PO TBEC
40.0000 mg | DELAYED_RELEASE_TABLET | Freq: Two times a day (BID) | ORAL | Status: DC
  Administered 2022-05-20 – 2022-05-23 (×7): 40 mg via ORAL
  Filled 2022-05-20 (×7): qty 1

## 2022-05-20 MED ORDER — CARVEDILOL 12.5 MG PO TABS
25.0000 mg | ORAL_TABLET | Freq: Two times a day (BID) | ORAL | Status: DC
  Administered 2022-05-20 – 2022-05-23 (×6): 25 mg via ORAL
  Filled 2022-05-20 (×6): qty 2

## 2022-05-20 MED ORDER — HYDROCOD POLI-CHLORPHE POLI ER 10-8 MG/5ML PO SUER: 5.0000 mL | Freq: Two times a day (BID) | ORAL | Status: DC | PRN

## 2022-05-20 MED ORDER — ONDANSETRON HCL 4 MG PO TABS: 4.0000 mg | ORAL_TABLET | Freq: Four times a day (QID) | ORAL | Status: DC | PRN

## 2022-05-20 MED ORDER — HYDROMORPHONE HCL 1 MG/ML IJ SOLN
1.0000 mg | INTRAMUSCULAR | Status: DC | PRN
  Administered 2022-05-20 – 2022-05-23 (×13): 2 mg via INTRAVENOUS
  Administered 2022-05-23: 1 mg via INTRAVENOUS
  Administered 2022-05-23 (×2): 2 mg via INTRAVENOUS
  Filled 2022-05-20 (×7): qty 2
  Filled 2022-05-20: qty 1
  Filled 2022-05-20 (×8): qty 2

## 2022-05-20 MED ORDER — MORPHINE SULFATE (PF) 2 MG/ML IV SOLN
2.0000 mg | INTRAVENOUS | Status: DC | PRN
  Administered 2022-05-20 (×2): 2 mg via INTRAVENOUS
  Filled 2022-05-20 (×2): qty 1

## 2022-05-20 MED ORDER — INSULIN ASPART 100 UNIT/ML IJ SOLN
0.0000 [IU] | INTRAMUSCULAR | Status: DC
  Administered 2022-05-20 (×2): 5 [IU] via SUBCUTANEOUS
  Administered 2022-05-20: 7 [IU] via SUBCUTANEOUS
  Administered 2022-05-20 – 2022-05-21 (×6): 5 [IU] via SUBCUTANEOUS
  Administered 2022-05-21: 2 [IU] via SUBCUTANEOUS
  Administered 2022-05-22: 5 [IU] via SUBCUTANEOUS
  Administered 2022-05-22: 3 [IU] via SUBCUTANEOUS
  Administered 2022-05-22: 5 [IU] via SUBCUTANEOUS
  Administered 2022-05-22 (×2): 2 [IU] via SUBCUTANEOUS
  Administered 2022-05-22: 3 [IU] via SUBCUTANEOUS
  Administered 2022-05-23: 7 [IU] via SUBCUTANEOUS
  Administered 2022-05-23: 2 [IU] via SUBCUTANEOUS
  Administered 2022-05-23: 3 [IU] via SUBCUTANEOUS
  Filled 2022-05-20: qty 0.09

## 2022-05-20 MED ORDER — INSULIN GLARGINE-YFGN 100 UNIT/ML ~~LOC~~ SOLN
20.0000 [IU] | Freq: Every day | SUBCUTANEOUS | Status: DC
  Filled 2022-05-20 (×2): qty 0.2

## 2022-05-20 MED ORDER — CARVEDILOL 12.5 MG PO TABS
25.0000 mg | ORAL_TABLET | Freq: Once | ORAL | Status: AC
  Administered 2022-05-20: 25 mg via ORAL
  Filled 2022-05-20: qty 2

## 2022-05-20 MED ORDER — ADULT MULTIVITAMIN W/MINERALS CH
1.0000 | ORAL_TABLET | Freq: Every day | ORAL | Status: DC
  Administered 2022-05-20 – 2022-05-23 (×4): 1 via ORAL
  Filled 2022-05-20 (×4): qty 1

## 2022-05-20 MED ORDER — HYDRALAZINE HCL 25 MG PO TABS
100.0000 mg | ORAL_TABLET | Freq: Three times a day (TID) | ORAL | Status: DC
  Administered 2022-05-20 – 2022-05-23 (×11): 100 mg via ORAL
  Filled 2022-05-20 (×11): qty 4

## 2022-05-20 MED ORDER — SODIUM CHLORIDE 0.9 % IV SOLN: INTRAVENOUS | Status: DC

## 2022-05-20 MED ORDER — IPRATROPIUM-ALBUTEROL 20-100 MCG/ACT IN AERS
1.0000 | INHALATION_SPRAY | Freq: Four times a day (QID) | RESPIRATORY_TRACT | Status: DC
  Administered 2022-05-20 – 2022-05-23 (×11): 1 via RESPIRATORY_TRACT
  Filled 2022-05-20: qty 4

## 2022-05-20 MED ORDER — GUAIFENESIN-DM 100-10 MG/5ML PO SYRP
10.0000 mL | ORAL_SOLUTION | ORAL | Status: DC | PRN
  Administered 2022-05-21: 10 mL via ORAL
  Filled 2022-05-20: qty 10

## 2022-05-20 MED ORDER — NIRMATRELVIR/RITONAVIR (PAXLOVID)TABLET
3.0000 | ORAL_TABLET | Freq: Two times a day (BID) | ORAL | Status: DC
  Administered 2022-05-20 – 2022-05-23 (×6): 3 via ORAL
  Filled 2022-05-20: qty 30

## 2022-05-20 MED ORDER — ENOXAPARIN SODIUM 40 MG/0.4ML IJ SOSY
40.0000 mg | PREFILLED_SYRINGE | INTRAMUSCULAR | Status: DC
  Administered 2022-05-20 – 2022-05-23 (×4): 40 mg via SUBCUTANEOUS
  Filled 2022-05-20 (×4): qty 0.4

## 2022-05-20 NOTE — Assessment & Plan Note (Addendum)
Mild COVID at this point without significant O2 requirement. May be contributing to GI symptoms; however, pt comes in very frequently (like once a month) with the same GI symptoms when he doesn't have COVID, so I'm more doubtful that his GI symptoms are totally due to Covid. -Patient with multiple comorbidities including poorly controlled diabetes mellitus, chronic kidney disease, high risk for decompensation. -Inflammatory markers with a CRP of 2.2, D-dimer of 0.34. -Continue Decadron. -Continue Paxlovid, vitamin C, zinc, Decadron, Combivent, PPI.

## 2022-05-20 NOTE — Assessment & Plan Note (Addendum)
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 Hemoglobin A1c 9.2 (05/10/2022). -CBG of 254 this morning.   Increase Semglee to 30 units daily as patient on steroids.  -SSI.

## 2022-05-20 NOTE — Assessment & Plan Note (Addendum)
-  Patient with complaints of abdominal pain radiating to his back. -Patient presented with nausea and vomiting. -Lipase levels elevated at 75 on presentation, repeat lipase level at 118.. -Still with abdominal pain, requiring round-the-clock IV pain medication of Dilaudid.   -Tolerating clears.   -Discontinue IV fluids, continue IV antiemetics, supportive care, pain management.   -Saline lock IV fluids the patient with complaints of shortness of breath supine.

## 2022-05-20 NOTE — Progress Notes (Signed)
PROGRESS NOTE    Randy [name].  GLO:[id number] DOB: 1981-03-11 DOA: 05/19/2022 PCP: Rema Fendt, NP    Chief Complaint  Patient presents with   Nasal Congestion   Cough   Vomiting    Brief Narrative:  Patient 42 year old gentleman history of hypertension, DM2, CKD 3, frequent admissions for intractable nausea vomiting he is from diabetic gastroparesis versus cannabis hyperemesis syndrome versus acute pancreatitis.  Patient recently admitted 12 29-12 31 for intractable nausea and vomiting which improved on Reglan.  Patient presented back to the ED with intractable nausea vomiting x 2 days, epigastric abdominal pain, cough, nasal congestion, exposure to sick contacts whole family had COVID-19 last week.  Patient noted to have tested positive for COVID at home on day of admission.  Patient admitted placed on bowel rest, placed on scheduled Reglan, noted to be in hypertensive urgency placed on a Cardene drip.  Patient also noted to be positive for COVID-19 virus infection.  Patient will be started on Paxlovid.  Statin will be held until course of Paxlovid has been completed.    Assessment & Plan:  Principal Problem:   Intractable nausea and vomiting Active Problems:   Hypertensive urgency   DM2 (diabetes mellitus, type 2) (HCC)   COVID-19 virus infection   Cannabis abuse   HLD (hyperlipidemia)   Stage 3a chronic kidney disease (CKD) (HCC)   GERD (gastroesophageal reflux disease)   Acute pancreatitis   Hypomagnesemia    Assessment and Plan: * Intractable nausea and vomiting Most likely due to either diabetic gastroparesis, cannabis hyperemesis syndrome, or both an acute pancreatitis as lipase levels noted to be elevated at 75 on admission and up to 118 on repeat lab work.. DDx includes GI symptoms from his COVID-19, or HTN urgency. Continue scheduled Reglan 10 mg every 6 hours.   -Trial of clear liquids.   IV fluids.   Pain management, supportive care.   IV morphine as  needed pain.  Hypertensive urgency Despite coreg, hydralazine, and amlodipine in ED, patients BP got worse. 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 Hemoglobin A1c 9.2 (05/10/2022). -CBG of 256 this morning. Increase Lantus to home regimen 35 units daily as patient on steroids.. -SSI.  COVID-19 virus infection Mild COVID at this point without significant O2 requirement. May be contributing to GI symptoms; however, pt comes in very frequently (like once a month) with the same GI symptoms when he doesn't have COVID, so I'm more doubtful that his GI symptoms are totally due to Covid today. -Patient with multiple comorbidities including poorly controlled diabetes mellitus, chronic kidney disease, high risk for decompensation. -Check inflammatory markers with a CRP of 2.2, D-dimer of 0.34. -Continue Decadron. -Discontinue statin and start Paxlovid.  Cannabis abuse Ongoing, cannabinoid hyperemesis syndrome is certainly in the differential to his N/V. Really wish hed quit this for at least a couple of months so we could see if his N/V was at all related, or all diabetic gastroparesis, or partly each.  HLD (hyperlipidemia) -Discontinue statin as patient to be started on Paxlovid for COVID-19 infection.   Stage 3a chronic kidney disease (CKD) (HCC) Creat today appears baseline  Hypomagnesemia -Magnesium noted at 1.5. -Magnesium sulfate 4 g IV x 1. -Repeat labs in  the AM.  Acute pancreatitis -Patient with complaints of abdominal pain radiating to his back. -Patient presented with nausea and vomiting. -Lipase levels elevated at 75 on presentation, repeat lipase level at 118.. -Clear liquid diet. -IV fluids, IV antiemetics,  supportive care.  GERD (gastroesophageal reflux disease) -PPI.         DVT prophylaxis: Lovenox Code Status: Full Family Communication: Updated patient.  No family at bedside. Disposition: Likely home once clinically improved.  Status is: Inpatient The patient will require care spanning > 2 midnights and should be moved to inpatient because: Severity of illness, currently on a Cardene drip.  Patient with COVID.   Consultants:  None  Procedures:  CT abdomen and pelvis 05/20/2022 Chest x-ray 05/19/2022   Antimicrobials:  Anti-infectives (From admission, onward)    Start     Dose/Rate Route Frequency Ordered Stop   05/20/22 1600  nirmatrelvir/ritonavir (PAXLOVID) 3 tablet        3 tablet Oral 2 times daily 05/20/22 0855 05/25/22 2159         Subjective: Patient laying on his side.  Complaining of abdominal pain and back pain.  Last episode of nausea and vomiting was yesterday prior to admission per patient.  Denies any further nausea or vomiting.  Patient with some shortness of breath.  Patient also with cough.  Asking for pain medication for his abdomen and back.  Does endorse prior history of pancreatitis although unsure what it is from.  Denies any recent alcohol use.  Stated he was hospitalized for the same symptoms about a month ago.  Patient does state positive for COVID.  Patient on Cardene drip.  Objective: Vitals:   05/20/22 0900 05/20/22 0945 05/20/22 1300 05/20/22 1400  BP: (!) 160/71 (!) 156/73 (!) 220/99 (!) 172/81  Pulse: (!) 110 99 (!) 112 (!) 107  Resp: 15 17 20 19   Temp:   98.7 F (37.1 C)   TempSrc:   Oral   SpO2: 99% 100% 96% 98%  Weight:   85.3 kg   Height:   5\' 8"  (1.727 m)     Intake/Output Summary (Last 24 hours) at 05/20/2022 1614 Last data filed at 05/20/2022 1300 Gross per 24 hour  Intake 203.12 ml  Output --  Net 203.12 ml   Filed Weights   05/19/22 2357 05/20/22 1300  Weight: 87 kg 85.3 kg    Examination:  General exam: Appears  calm and comfortable  Respiratory system: Clear to auscultation.  No wheezes, no crackles, no rhonchi.  Fair air movement.  Speaking in full sentences.  Respiratory effort normal. Cardiovascular system: S1 & S2 heard, RRR. No JVD, murmurs, rubs, gallops or clicks. No pedal edema. Gastrointestinal system: Abdomen is nondistended, soft and diffusely tender to palpation.  Positive bowel sounds.  No rebound.  No guarding.  Central nervous system: Alert and oriented. No focal neurological deficits. Extremities: Symmetric 5 x 5 power. Skin: No rashes, lesions or ulcers Psychiatry: Judgement and insight appear normal. Mood & affect appropriate.     Data Reviewed:   CBC: Recent Labs  Lab 05/19/22 1847 05/20/22 1001  WBC 7.1 10.7*  NEUTROABS 6.1 9.4*  HGB 11.5* 10.9*  HCT 35.0* 32.5*  MCV 83.1 84.0  PLT 357 161    Basic Metabolic Panel: Recent Labs  Lab 05/19/22 1847 05/20/22 1001  NA 133* 132*  K 4.4 4.1  CL 101 100  CO2 23 22  GLUCOSE 223* 287*  BUN 20 22*  CREATININE 1.59* 1.73*  CALCIUM 9.2  8.8*  MG  --  1.5*  PHOS  --  2.7    GFR: Estimated Creatinine Clearance: 59.2 mL/min (A) (by C-G formula based on SCr of 1.73 mg/dL (H)).  Liver Function Tests: Recent Labs  Lab 05/19/22 1847 05/20/22 1001  AST 24 24  ALT 30 27  ALKPHOS 115 103  BILITOT 0.5 0.6  PROT 8.2* 7.5  ALBUMIN 3.8 3.2*    CBG: Recent Labs  Lab 05/20/22 0800 05/20/22 1308 05/20/22 1553  GLUCAP 258* 256* 316*     Recent Results (from the past 240 hour(s))  MRSA Next Gen by PCR, Nasal     Status: None   Collection Time: 05/10/22  8:46 PM   Specimen: Nasal Mucosa; Nasal Swab  Result Value Ref Range Status   MRSA by PCR Next Gen NOT DETECTED NOT DETECTED Final    Comment: (NOTE) The GeneXpert MRSA Assay (FDA approved for NASAL specimens only), is one component of a comprehensive MRSA colonization surveillance program. It is not intended to diagnose MRSA infection nor to guide or monitor  treatment for MRSA infections. Test performance is not FDA approved in patients less than 58 years old. Performed at [facility], [address] [place], [place] [address]   Resp panel by RT-PCR (RSV, Flu A&B, Covid) Anterior Nasal Swab     Status: Abnormal   Collection Time: 05/19/22 10:38 PM   Specimen: Anterior Nasal Swab  Result Value Ref Range Status   SARS Coronavirus 2 by RT PCR POSITIVE (A) NEGATIVE Final    Comment: (NOTE) SARS-CoV-2 target nucleic acids are DETECTED.  The SARS-CoV-2 RNA is generally detectable in upper respiratory specimens during the acute phase of infection. Positive results are indicative of the presence of the identified virus, but do not rule out bacterial infection or co-infection with other pathogens not detected by the test. Clinical correlation with patient history and other diagnostic information is necessary to determine patient infection status. The expected result is Negative.  Fact Sheet for Patients: BloggerCourse.com  Fact Sheet for Healthcare Providers: SeriousBroker.it  This test is not yet approved or cleared by the [place] FDA and  has been authorized for detection and/or diagnosis of SARS-CoV-2 by FDA under an Emergency Use Authorization (EUA).  This EUA will remain in effect (meaning this test can be used) for the duration of  the COVID-19 declaration under Section 564(b)(1) of the A ct, 21 U.S.C. section 360bbb-3(b)(1), unless the authorization is terminated or revoked sooner.     Influenza A by PCR NEGATIVE NEGATIVE Final   Influenza B by PCR NEGATIVE NEGATIVE Final    Comment: (NOTE) The Xpert Xpress SARS-CoV-2/FLU/RSV plus assay is intended as an aid in the diagnosis of influenza from Nasopharyngeal swab specimens and should not be used as a sole basis for treatment. Nasal washings and aspirates are unacceptable for Xpert Xpress  SARS-CoV-2/FLU/RSV testing.  Fact Sheet for Patients: BloggerCourse.com  Fact Sheet for Healthcare Providers: SeriousBroker.it  This test is not yet approved or cleared by the [place] FDA and has been authorized for detection and/or diagnosis of SARS-CoV-2 by FDA under an Emergency Use Authorization (EUA). This EUA will remain in effect (meaning this test can be used) for the duration of the COVID-19 declaration under Section 564(b)(1) of the Act, 21 U.S.C. section 360bbb-3(b)(1), unless the authorization is terminated or revoked.     Resp Syncytial Virus by PCR NEGATIVE NEGATIVE Final    Comment: (NOTE) Fact Sheet for Patients: BloggerCourse.com  Fact  Sheet for Healthcare Providers: SeriousBroker.it  This test is not yet approved or cleared by the [place] and has been authorized for detection and/or diagnosis of SARS-CoV-2 by FDA under an Emergency Use Authorization (EUA). This EUA will remain in effect (meaning this test can be used) for the duration of the COVID-19 declaration under Section 564(b)(1) of the Act, 21 U.S.C. section 360bbb-3(b)(1), unless the authorization is terminated or revoked.  Performed at [facility], [address] [place], [place] [address]          Radiology Studies: CT ABDOMEN PELVIS W CONTRAST  Result Date: 05/20/2022 CLINICAL DATA:  Abdominal pain, acute, nonlocalized. Nausea/vomiting, upper abd pain. Diagnosed with COVID. EXAM: CT ABDOMEN AND PELVIS WITH CONTRAST TECHNIQUE: Multidetector CT imaging of the abdomen and pelvis was performed using the standard protocol following bolus administration of intravenous contrast. RADIATION DOSE REDUCTION: This exam was performed according to the departmental dose-optimization program which includes automated exposure control, adjustment of the mA and/or kV according to  patient size and/or use of iterative reconstruction technique. CONTRAST:  OMNIPAQUE IOHEXOL 300 MG/ML  SOLN COMPARISON:  CT abdomen pelvis 05/10/2022 FINDINGS: Lower chest: No acute abnormality. Hepatobiliary: No focal liver abnormality. No gallstones, gallbladder wall thickening, or pericholecystic fluid. No biliary dilatation. Pancreas: No focal lesion. Normal pancreatic contour. No surrounding inflammatory changes. No main pancreatic ductal dilatation. Spleen: Normal in size without focal abnormality. Adrenals/Urinary Tract: No adrenal nodule bilaterally. Bilateral kidneys enhance symmetrically. No hydronephrosis. No hydroureter. The urinary bladder is unremarkable. Stomach/Bowel: Stomach is within normal limits. No evidence of bowel wall thickening or dilatation. Few scattered colonic diverticula. Appendix appears normal. Vascular/Lymphatic: No abdominal aorta or iliac aneurysm. Mild atherosclerotic plaque of the aorta and its branches. No abdominal, pelvic, or inguinal lymphadenopathy. Reproductive: Prostate is unremarkable. Other: No intraperitoneal free fluid. No intraperitoneal free gas. No organized fluid collection. Musculoskeletal: No abdominal wall hernia or abnormality. No suspicious lytic or blastic osseous lesions. No acute displaced fracture. IMPRESSION: 1. No acute intra-abdominal or intrapelvic abnormality. 2.  Aortic Atherosclerosis (ICD10-I70.0). Electronically Signed   By: Tish Frederickson M.D.   On: 05/20/2022 00:38   DG Chest 2 View  Result Date: 05/19/2022 CLINICAL DATA:  Chest pain.  Body aches. EXAM: CHEST - 2 VIEW COMPARISON:  Chest radiographs dated January 03, 2022 FINDINGS: The heart size and mediastinal contours are within normal limits. Both lungs are clear. The visualized skeletal structures are unremarkable. IMPRESSION: No active cardiopulmonary disease. Electronically Signed   By: Larose Hires D.O.   On: 05/19/2022 18:22        Scheduled Meds:  [START ON 05/21/2022]  amLODipine  10 mg Oral Daily   vitamin C  500 mg Oral Daily   carvedilol  25 mg Oral BID WC   Chlorhexidine Gluconate Cloth  6 each Topical Daily   dexamethasone  6 mg Oral Q24H   enoxaparin (LOVENOX) injection  40 mg Subcutaneous Q24H   folic acid  1 mg Oral Daily   hydrALAZINE  100 mg Oral Q8H   insulin aspart  0-9 Units Subcutaneous Q4H   insulin glargine-yfgn  20 Units Subcutaneous Daily   Ipratropium-Albuterol  1 puff Inhalation Q6H   metoCLOPramide (REGLAN) injection  10 mg Intravenous Q6H   multivitamin with minerals  1 tablet Oral Daily   nirmatrelvir/ritonavir  3 tablet Oral BID   pantoprazole  40 mg Oral BID AC   thiamine  100 mg Oral Daily   zinc sulfate  220 mg  Oral Daily   Continuous Infusions:  sodium chloride 125 mL/hr at 05/20/22 1508   magnesium sulfate bolus IVPB     niCARDipine Stopped (05/20/22 1018)     LOS: 0 days    Time spent: 45 minutes    Ramiro Harvest, MD [facility]   To contact the attending provider between 7A-7P or the covering provider during after hours 7P-7A, please log into the web site [email or web address] and access using universal [place] password for that web site. If you do not have the password, please call the hospital operator.  05/20/2022, 4:14 PM

## 2022-05-20 NOTE — Assessment & Plan Note (Signed)
Slight bump in renal function currently at 1.94 however close to baseline. -Saline lock IV fluids. -Will give a dose of Lasix 40 mg IV x 1 as patient with complaints of shortness of breath when laying flat.

## 2022-05-20 NOTE — Assessment & Plan Note (Addendum)
Ongoing, cannabinoid hyperemesis syndrome is certainly in the differential to his N/V. Really wish hed quit this for at least a couple of months so we could see if his N/V was at all related, or all diabetic gastroparesis, or partly each.

## 2022-05-20 NOTE — Assessment & Plan Note (Signed)
PPI ?

## 2022-05-20 NOTE — Assessment & Plan Note (Signed)
Despite coreg, hydralazine, and amlodipine in ED, patients BP got worse. Cardene gtt Resume all home BP meds

## 2022-05-20 NOTE — Assessment & Plan Note (Addendum)
-  Discontinue statin as patient to be started on Paxlovid for COVID-19 infection.

## 2022-05-20 NOTE — Assessment & Plan Note (Addendum)
Most likely due to either diabetic gastroparesis, cannabis hyperemesis syndrome, or both an acute pancreatitis as lipase levels noted to be elevated at 75 on admission and up to 118 on repeat lab work.. DDx includes GI symptoms from his COVID-58, or HTN urgency. Continue scheduled Reglan 10 mg every 6 hours.   -Trial of clear liquids.   IV fluids.   Pain management, supportive care.   IV morphine as needed pain.

## 2022-05-20 NOTE — H&P (Signed)
History and Physical    Patient: Randy [name]. POE:[id number] DOB: Jan 27, 1981 DOA: 05/19/2022 DOS: the patient was seen and examined on 05/20/2022 PCP: Camillia Herter, NP  Patient coming from: Home  Chief Complaint:  Chief Complaint  Patient presents with   Nasal Congestion   Cough   Vomiting   HPI: Randy [name]. is a 42 y.o. male with medical history significant of HTN, DM2, CKD 3, frequent admissions for intractable N/V from either diabetic gastroparesis and/or cannabis hyperemesis syndrome.  Pt most recently admitted 12/29-12/31 for intractable N/V.  Improved on Reglan.  Presents back to ED today with intractable N/V once again, onset 2 days ago.  Associated epigastric abd pain.  This time however, he also has cough, nasal congestion, and his entire family had COVID-19 last week.  He tested positive for COVID at home today.    Review of Systems: As mentioned in the history of present illness. All other systems reviewed and are negative. Past Medical History:  Diagnosis Date   Essential hypertension 05/31/2019   HLD (hyperlipidemia)    Pancreatitis    Type II diabetes mellitus ([place])    Past Surgical History:  Procedure Laterality Date   NO PAST SURGERIES     Social History:  reports that he quit smoking about 10 years ago. His smoking use included cigarettes. He has a 5.61 pack-year smoking history. He has been exposed to tobacco smoke. He has never used smokeless tobacco. He reports current drug use. Drug: Marijuana. He reports that he does not drink alcohol.  Allergies  Allergen Reactions   Tomato Itching    Family History  Problem Relation Age of Onset   Hypertension Mother    Diabetes Father    Hypertension Father    Diabetes Brother    Diabetes Maternal Grandmother    Colon cancer Neg Hx    Esophageal cancer Neg Hx    Rectal cancer Neg Hx    Stomach cancer Neg Hx     Prior to Admission medications   Medication Sig Start Date End Date Taking?  Authorizing Provider  amLODipine (NORVASC) 5 MG tablet Take 2 tablets (10 mg total) by mouth daily. 05/12/22 07/11/22  Charlynne Cousins, MD  atorvastatin (LIPITOR) 40 MG tablet Take 2 tablets (80 mg total) by mouth daily. 05/12/22 09/09/22  Charlynne Cousins, MD  blood glucose meter kit and supplies KIT Dispense based on patient and insurance preference. Use up to four times daily as directed. 01/06/22   Elodia Florence., MD  carvedilol (COREG) 25 MG tablet Take 1 tablet (25 mg total) by mouth 2 (two) times daily with a meal. 05/12/22 09/09/22  Charlynne Cousins, MD  glucose blood (ACCU-CHEK GUIDE) test strip Use to check blood sugar 3 times daily. 10/15/21   Charlott Rakes, MD  glucose blood test strip USE UP TO FOUR TIMES DAILY AS DIRECTED 01/06/22   Elodia Florence., MD  hydrALAZINE (APRESOLINE) 100 MG tablet Take 1 tablet (100 mg total) by mouth every 8 (eight) hours. 05/12/22 07/11/22  Charlynne Cousins, MD  Insulin Glargine [facility]) 100 UNIT/ML Inject 18 Units into the skin daily. Patient taking differently: Inject 35 Units into the skin daily. 01/06/22 05/18/22  Elodia Florence., MD  Insulin Pen Needle (TRUEPLUS 5-BEVEL PEN NEEDLES) 32G X 4 MM MISC Use to inject basaglar once daily. 01/30/22   Camillia Herter, NP  metoCLOPramide (REGLAN) 5 MG tablet Take 1 tablet (5 mg  total) by mouth 4 (four) times daily -  before meals and at bedtime for 14 days. Please discuss with your PCP whether this needs to be continued Patient not taking: Reported on 03/27/2022 01/06/22 05/18/22  Zigmund Daniel., MD  ondansetron (ZOFRAN-ODT) 4 MG disintegrating tablet Take 1 tablet (4 mg total) by mouth every 8 (eight) hours as needed for nausea or vomiting. 03/27/22   Tilden Fossa, MD  pantoprazole (PROTONIX) 40 MG tablet Take 1 tablet (40 mg total) by mouth 2 (two) times daily. 05/12/22 05/12/23  Marinda Elk, MD  TRUEplus Lancets 28G MISC USE UP TO 4 TIMES DAILY 01/06/22    Zigmund Daniel., MD    Physical Exam: Vitals:   05/20/22 0340 05/20/22 0415 05/20/22 0515 05/20/22 0520  BP:  (!) 201/88 (!) 232/108   Pulse: 99 97 (!) 105   Resp:  16 13   Temp:    100 F (37.8 C)  TempSrc:    Oral  SpO2:  98% 98%   Weight:      Height:       Constitutional: NAD, calm, comfortable Eyes: PERRL, lids and conjunctivae normal ENMT: Mucous membranes are moist. Posterior pharynx clear of any exudate or lesions.Normal dentition.  Neck: normal, supple, no masses, no thyromegaly Respiratory: clear to auscultation bilaterally, no wheezing, no crackles. Normal respiratory effort. No accessory muscle use.  Cardiovascular: Regular rate and rhythm, no murmurs / rubs / gallops. No extremity edema. 2+ pedal pulses. No carotid bruits.  Abdomen: Mild diffuse TTP. Musculoskeletal: no clubbing / cyanosis. No joint deformity upper and lower extremities. Good ROM, no contractures. Normal muscle tone.  Skin: no rashes, lesions, ulcers. No induration Neurologic: CN 2-12 grossly intact. Sensation intact, DTR normal. Strength 5/5 in all 4.  Psychiatric: Normal judgment and insight. Alert and oriented x 3. Normal mood.   Data Reviewed:    CT AP = no acute findings  Lipase 75     Latest Ref Rng & Units 05/19/2022    6:47 PM 05/11/2022    8:47 AM 05/10/2022    3:24 AM  CBC  WBC 4.0 - 10.5 K/uL 7.1  9.3  8.1   Hemoglobin 13.0 - 17.0 g/dL 86.7  61.9  50.9   Hematocrit 39.0 - 52.0 % 35.0  32.5  37.3   Platelets 150 - 400 K/uL 357  296  332       Latest Ref Rng & Units 05/19/2022    6:47 PM 05/12/2022    8:44 AM 05/11/2022    8:47 AM  CMP  Glucose 70 - 99 mg/dL 326  712  458   BUN 6 - 20 mg/dL 20  17  26    Creatinine 0.61 - 1.24 mg/dL  0.99  8.33   Sodium 135 - 145 mmol/L 133  136  136   Potassium 3.5 - 5.1 mmol/L 4.4  4.0  3.9   Chloride 98 - 111 mmol/L 101  107  107   CO2 22 - 32 mmol/L 23  24  21    Calcium 8.9 - 10.3 mg/dL 9.2  8.8  8.6   Total Protein 6.5 -  8.1 g/dL 8.2   7.2   Total Bilirubin 0.3 - 1.2 mg/dL 0.5   0.6   Alkaline Phos 38 - 126 U/L 115   85   AST 15 - 41 U/L 24   14   ALT 0 - 44 U/L 30   16    COVID-19 positive  RSV and flu neg  Assessment and Plan: * Intractable nausea and vomiting Most likely due to either diabetic gastroparesis, cannabis hyperemesis syndrome, or both. DDx includes GI symptoms from his COVID-12, or HTN urgency. Scheduled reglan 10mg  Q6H NPO Would normally put on IVF; however, BP running almost [id number] systolic currently so will hold off for the moment. Avoid opiates due to decreased gastric emptying. Had pancreatitis at 1 point in past, however today once again has near normal lipase and negative CT scan.  Dont think pancreatitis playing a role.  Hypertensive urgency Despite coreg, hydralazine, and amlodipine in ED, patients BP got worse. 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 Strongly suspect diabetic gastroparesis contributing to N/V. Lantus 20u daily (takes 35u daily at home). Sensitive SSI Q4H  COVID-19 virus infection Mild COVID at this point without O2 requirement. May be contributing to GI symptoms; however, pt comes in very frequently (like once a month) with the same GI symptoms when he doesn't have COVID, so I'm more doubtful that his GI symptoms are totally due to Covid today. Supportive care Will hold off on starting paxlovid for the moment given statin use.  Cannabis abuse Ongoing, cannabinoid hyperemesis syndrome is certainly in the differential to his N/V. Really wish hed quit this for at least a couple of months so we could see if his N/V was at all related, or all diabetic gastroparesis, or partly each.  HLD (hyperlipidemia) Continue statin  Stage 3a chronic kidney disease (CKD)  (HCC) Creat today appears baseline      Advance Care Planning:   Code Status: Full Code  Consults: None  Family Communication: No family in room  Severity of Illness: The appropriate patient status for this patient is OBSERVATION. Observation status is judged to be reasonable and necessary in order to provide the required intensity of service to ensure the patient's safety. The patient's presenting symptoms, physical exam findings, and initial radiographic and laboratory data in the context of their medical condition is felt to place them at decreased risk for further clinical deterioration. Furthermore, it is anticipated that the patient will be medically stable for discharge from the hospital within 2 midnights of admission.   Author: Etta Quill., DO 05/20/2022 6:17 AM  For on call review [email or web address].

## 2022-05-20 NOTE — Assessment & Plan Note (Signed)
-  Repleted.  Magnesium was 1.5, currently at 3.1 this morning. -Repeat labs in the AM.

## 2022-05-20 NOTE — Hospital Course (Signed)
Patient 42 year old gentleman history of hypertension, DM2, CKD 3, frequent admissions for intractable nausea vomiting he is from diabetic gastroparesis versus cannabis hyperemesis syndrome versus acute pancreatitis.  Patient recently admitted 12 29-12 31 for intractable nausea and vomiting which improved on Reglan.  Patient presented back to the ED with intractable nausea vomiting x 2 days, epigastric abdominal pain, cough, nasal congestion, exposure to sick contacts whole family had COVID-19 last week.  Patient noted to have tested positive for COVID at home on day of admission.  Patient admitted placed on bowel rest, placed on scheduled Reglan, noted to be in hypertensive urgency placed on a Cardene drip.  Patient also noted to be positive for COVID-19 virus infection.  Patient will be started on Paxlovid.  Statin will be held until course of Paxlovid has been completed.

## 2022-05-21 ENCOUNTER — Inpatient Hospital Stay (HOSPITAL_COMMUNITY): Payer: BLUE CROSS/BLUE SHIELD

## 2022-05-21 ENCOUNTER — Inpatient Hospital Stay: Payer: Self-pay | Admitting: Family

## 2022-05-21 DIAGNOSIS — R0602 Shortness of breath: Secondary | ICD-10-CM | POA: Clinically undetermined

## 2022-05-21 DIAGNOSIS — R112 Nausea with vomiting, unspecified: Secondary | ICD-10-CM | POA: Diagnosis not present

## 2022-05-21 DIAGNOSIS — I160 Hypertensive urgency: Secondary | ICD-10-CM | POA: Diagnosis not present

## 2022-05-21 DIAGNOSIS — F1210 Cannabis abuse, uncomplicated: Secondary | ICD-10-CM | POA: Diagnosis not present

## 2022-05-21 DIAGNOSIS — K8500 Idiopathic acute pancreatitis without necrosis or infection: Secondary | ICD-10-CM | POA: Diagnosis not present

## 2022-05-21 DIAGNOSIS — K219 Gastro-esophageal reflux disease without esophagitis: Secondary | ICD-10-CM

## 2022-05-21 LAB — COMPREHENSIVE METABOLIC PANEL
ALT: 26 U/L (ref 0–44)
AST: 19 U/L (ref 15–41)
Albumin: 3.6 g/dL (ref 3.5–5.0)
Alkaline Phosphatase: 98 U/L (ref 38–126)
Anion gap: 10 (ref 5–15)
BUN: 30 mg/dL — ABNORMAL HIGH (ref 6–20)
CO2: 19 mmol/L — ABNORMAL LOW (ref 22–32)
Calcium: 8.5 mg/dL — ABNORMAL LOW (ref 8.9–10.3)
Chloride: 102 mmol/L (ref 98–111)
Creatinine, Ser: 1.94 mg/dL — ABNORMAL HIGH (ref 0.61–1.24)
GFR, Estimated: 44 mL/min — ABNORMAL LOW (ref >60)
Glucose, Bld: 284 mg/dL — ABNORMAL HIGH (ref 70–99)
Potassium: 4.3 mmol/L (ref 3.5–5.1)
Sodium: 131 mmol/L — ABNORMAL LOW (ref 135–145)
Total Bilirubin: 0.4 mg/dL (ref 0.3–1.2)
Total Protein: 7.8 g/dL (ref 6.5–8.1)

## 2022-05-21 LAB — LIPASE, BLOOD: Lipase: 39 U/L (ref 11–51)

## 2022-05-21 LAB — CBC WITH DIFFERENTIAL/PLATELET
Abs Immature Granulocytes: 0.05 10*3/uL (ref 0.00–0.07)
Basophils Absolute: 0 10*3/uL (ref 0.0–0.1)
Basophils Relative: 0 %
Eosinophils Absolute: 0 10*3/uL (ref 0.0–0.5)
Eosinophils Relative: 0 %
HCT: 33 % — ABNORMAL LOW (ref 39.0–52.0)
Hemoglobin: 10.4 g/dL — ABNORMAL LOW (ref 13.0–17.0)
Immature Granulocytes: 1 %
Lymphocytes Relative: 9 %
Lymphs Abs: 1 10*3/uL (ref 0.7–4.0)
MCH: 27.3 pg (ref 26.0–34.0)
MCHC: 31.5 g/dL (ref 30.0–36.0)
MCV: 86.6 fL (ref 80.0–100.0)
Monocytes Absolute: 0.5 10*3/uL (ref 0.1–1.0)
Monocytes Relative: 4 %
Neutro Abs: 9.1 10*3/uL — ABNORMAL HIGH (ref 1.7–7.7)
Neutrophils Relative %: 86 %
Platelets: 303 10*3/uL (ref 150–400)
RBC: 3.81 MIL/uL — ABNORMAL LOW (ref 4.22–5.81)
RDW: 12.6 % (ref 11.5–15.5)
WBC: 10.7 10*3/uL — ABNORMAL HIGH (ref 4.0–10.5)
nRBC: 0 % (ref 0.0–0.2)

## 2022-05-21 LAB — GLUCOSE, CAPILLARY
Glucose-Capillary: 199 mg/dL — ABNORMAL HIGH (ref 70–99)
Glucose-Capillary: 226 mg/dL — ABNORMAL HIGH (ref 70–99)
Glucose-Capillary: 254 mg/dL — ABNORMAL HIGH (ref 70–99)
Glucose-Capillary: 261 mg/dL — ABNORMAL HIGH (ref 70–99)
Glucose-Capillary: 280 mg/dL — ABNORMAL HIGH (ref 70–99)

## 2022-05-21 LAB — FERRITIN: Ferritin: 246 ng/mL (ref 24–336)

## 2022-05-21 LAB — MAGNESIUM: Magnesium: 3.1 mg/dL — ABNORMAL HIGH (ref 1.7–2.4)

## 2022-05-21 LAB — C-REACTIVE PROTEIN: CRP: 3.8 mg/dL — ABNORMAL HIGH (ref <1.0)

## 2022-05-21 LAB — PHOSPHORUS: Phosphorus: 4 mg/dL (ref 2.5–4.6)

## 2022-05-21 LAB — BRAIN NATRIURETIC PEPTIDE: B Natriuretic Peptide: 110.9 pg/mL — ABNORMAL HIGH (ref 0.0–100.0)

## 2022-05-21 LAB — D-DIMER, QUANTITATIVE: D-Dimer, Quant: 0.28 ug/mL-FEU (ref 0.00–0.50)

## 2022-05-21 MED ORDER — FUROSEMIDE 10 MG/ML IJ SOLN
40.0000 mg | Freq: Once | INTRAMUSCULAR | Status: AC
  Administered 2022-05-21: 40 mg via INTRAVENOUS
  Filled 2022-05-21: qty 4

## 2022-05-21 MED ORDER — INSULIN GLARGINE-YFGN 100 UNIT/ML ~~LOC~~ SOLN
30.0000 [IU] | Freq: Every day | SUBCUTANEOUS | Status: DC
  Administered 2022-05-21 – 2022-05-23 (×3): 30 [IU] via SUBCUTANEOUS
  Filled 2022-05-21 (×3): qty 0.3

## 2022-05-21 NOTE — Progress Notes (Signed)
PROGRESS NOTE    Randy [name].  WNI:[id number] DOB: 03-10-1981 DOA: 05/19/2022 PCP: Camillia Herter, NP    Chief Complaint  Patient presents with   Nasal Congestion   Cough   Vomiting    Brief Narrative:  Patient 42 year old gentleman history of hypertension, DM2, CKD 3, frequent admissions for intractable nausea vomiting he is from diabetic gastroparesis versus cannabis hyperemesis syndrome versus acute pancreatitis.  Patient recently admitted 12 29-12 31 for intractable nausea and vomiting which improved on Reglan.  Patient presented back to the ED with intractable nausea vomiting x 2 days, epigastric abdominal pain, cough, nasal congestion, exposure to sick contacts whole family had COVID-19 last week.  Patient noted to have tested positive for COVID at home on day of admission.  Patient admitted placed on bowel rest, placed on scheduled Reglan, noted to be in hypertensive urgency placed on a Cardene drip.  Patient also noted to be positive for COVID-19 virus infection.  Patient will be started on Paxlovid.  Statin will be held until course of Paxlovid has been completed.    Assessment & Plan:  Principal Problem:   Intractable nausea and vomiting Active Problems:   Hypertensive urgency   DM2 (diabetes mellitus, type 2) (HCC)   COVID-19 virus infection   Cannabis abuse   HLD (hyperlipidemia)   Stage 3a chronic kidney disease (CKD) (HCC)   GERD (gastroesophageal reflux disease)   Acute pancreatitis   Hypomagnesemia   COVID-19   Shortness of breath    Assessment and Plan: * Intractable nausea and vomiting Most likely due to either diabetic gastroparesis, cannabis hyperemesis syndrome, or both an acute pancreatitis as lipase levels noted to be elevated at 75 on admission and up to 118 on repeat lab work.. DDx includes GI symptoms from his COVID-63, or HTN urgency. -Clinical improvement. Continue scheduled Reglan 10 mg every 6 hours.   -Tolerating clear liquids which we  will continue for now.   Saline lock IV fluids as patient with complaints of shortness of breath when supine.    Pain management, supportive care.   IV Dilaudid as needed pain.  Hypertensive urgency Despite coreg, hydralazine, and amlodipine in ED, patients BP got worse. -Patient was off Cardene drip yesterday-Cardene drip placed on overnight. -Systolic blood pressures in the 150s, wean Cardene drip to off. -Continue home regimen of Norvasc, hydralazine, Coreg. -Will give a dose of Lasix 40 mg IV x1.  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 Hemoglobin A1c 9.2 (05/10/2022). -CBG of 254 this morning.   Increase Semglee to 30 units daily as patient on steroids.  -SSI.   COVID-19 virus infection Mild COVID at this point without significant O2 requirement. May be contributing to GI symptoms; however, pt comes in very frequently (like once a month) with the same GI symptoms when he doesn't have COVID, so I'm more doubtful that his GI symptoms are totally due to Covid. -Patient with multiple comorbidities including poorly controlled diabetes mellitus, chronic kidney disease, high risk for decompensation. -Inflammatory markers with a CRP of 2.2, D-dimer of 0.34. -Continue Decadron. -Continue Paxlovid, vitamin C, zinc, Decadron, Combivent, PPI.  Cannabis abuse Ongoing, cannabinoid hyperemesis syndrome is certainly in the differential to his N/V. Really wish hed quit this for at least a couple of months  so we could see if his N/V was at all related, or all diabetic gastroparesis, or partly each.  HLD (hyperlipidemia) -Continue to hold statin until Paxlovid is completed for COVID-19 infection.   Stage 3a chronic kidney disease (CKD) (HCC) Slight bump in renal function  currently at 1.94 however close to baseline. -Saline lock IV fluids. -Will give a dose of Lasix 40 mg IV x 1 as patient with complaints of shortness of breath when laying flat.  Shortness of breath - Patient with complaints of shortness of breath today states cannot be in the supine position without feeling very short of breath. -Patient also admitted with a COVID-19 infection. -Patient noted to be +1.898 L during this hospitalization. -Saline lock IV fluids. -Check a chest x-ray, BNP. -Lasix 40 mg IV x 1. -Monitor urine output.  Hypomagnesemia -Repleted.  Magnesium was 1.5, currently at 3.1 this morning. -Repeat labs in the AM.  Acute pancreatitis -Patient with complaints of abdominal pain radiating to his back. -Patient presented with nausea and vomiting. -Lipase levels elevated at 75 on presentation, repeat lipase level at 118.. -Still with abdominal pain, requiring round-the-clock IV pain medication of Dilaudid.   -Tolerating clears.   -Discontinue IV fluids, continue IV antiemetics, supportive care, pain management.   -Saline lock IV fluids the patient with complaints of shortness of breath supine.    GERD (gastroesophageal reflux disease) -PPI.         DVT prophylaxis: Lovenox Code Status: Full Family Communication: Updated patient.  No family at bedside. Disposition: Remain in stepdown unit today.  Likely home once clinically improved.  Status is: Inpatient The patient will require care spanning > 2 midnights and should be moved to inpatient because: Severity of illness, currently on a Cardene drip.  Patient with COVID.   Consultants:  None  Procedures:  CT abdomen and pelvis 05/20/2022 Chest x-ray 05/19/2022, 05/21/2022   Antimicrobials:  Anti-infectives (From admission, onward)    Start     Dose/Rate Route Frequency Ordered Stop   05/20/22 1600  nirmatrelvir/ritonavir (PAXLOVID) 3 tablet        3 tablet Oral 2 times daily 05/20/22 0855 05/25/22 2159          Subjective: Patient laying in bed.  Still with epigastric abdominal pain, per RN still requiring IV pain medications around-the-clock.  Patient with complaints of shortness of breath when laying flat.  Denies any chest pain.  Noted to have been placed back on the Cardizem drip overnight.  Systolic blood pressures in the 150s this morning.    Objective: Vitals:   05/21/22 0830 05/21/22 0900 05/21/22 0930 05/21/22 1000  BP: (!) 181/82 (!) 159/84 (!) 150/86 139/81  Pulse: 87 80 89 90  Resp: (!) 9 14 16 14   Temp:      TempSrc:      SpO2: 93% 95% 96% 96%  Weight:      Height:        Intake/Output Summary (Last 24 hours) at 05/21/2022 1024 Last data filed at 05/21/2022 0800 Gross per 24 hour  Intake 2681.94 ml  Output 975 ml  Net 1706.94 ml   Filed Weights   05/19/22 2357 05/20/22 1300  Weight: 87 kg 85.3 kg    Examination:  General exam: NAD. Respiratory system: Decreased breath sounds in the bases.  No wheezes, no crackles, no rhonchi.  Fair air movement.  Speaking in full sentences.  No use of accessory muscles of respiration.   Cardiovascular system: Regular rate rhythm no murmurs rubs  or gallops.  No JVD.  No lower extremity edema.  Gastrointestinal system: Abdomen is soft, nondistended, good Keates tenderness to palpation in the epigastrium and diffusely.  Positive bowel sounds.  No rebound.  No guarding.  Central nervous system: Alert and oriented. No focal neurological deficits. Extremities: Symmetric 5 x 5 power. Skin: No rashes, lesions or ulcers Psychiatry: Judgement and insight appear normal. Mood & affect appropriate.     Data Reviewed:   CBC: Recent Labs  Lab 05/19/22 1847 05/20/22 1001 05/21/22 0319  WBC 7.1 10.7* 10.7*  NEUTROABS 6.1 9.4* 9.1*  HGB 11.5* 10.9* 10.4*  HCT 35.0* 32.5* 33.0*  MCV 83.1 84.0 86.6  PLT 357 305 303    Basic Metabolic Panel: Recent Labs  Lab 05/19/22 1847 05/20/22 1001 05/21/22 0319  NA 133* 132* 131*  K 4.4 4.1 4.3   CL 101 100 102  CO2 23 22 19*  GLUCOSE 223* 287* 284*  BUN 20 22* 30*  CREATININE 1.59* 1.73* 1.94*  CALCIUM 9.2 8.8* 8.5*  MG  --  1.5* 3.1*  PHOS  --  2.7 4.0    GFR: Estimated Creatinine Clearance: 52.8 mL/min (A) (by C-G formula based on SCr of 1.94 mg/dL (H)).  Liver Function Tests: Recent Labs  Lab 05/19/22 1847 05/20/22 1001 05/21/22 0319  AST 24 24 19   ALT 30 27 26   ALKPHOS 115 103 98  BILITOT 0.5 0.6 0.4  PROT 8.2* 7.5 7.8  ALBUMIN 3.8 3.2* 3.6    CBG: Recent Labs  Lab 05/20/22 1308 05/20/22 1553 05/20/22 2345 05/21/22 0319 05/21/22 0754  GLUCAP 256* 316* 278* 254* 280*     Recent Results (from the past 240 hour(s))  Resp panel by RT-PCR (RSV, Flu A&B, Covid) Anterior Nasal Swab     Status: Abnormal   Collection Time: 05/19/22 10:38 PM   Specimen: Anterior Nasal Swab  Result Value Ref Range Status   SARS Coronavirus 2 by RT PCR POSITIVE (A) NEGATIVE Final    Comment: (NOTE) SARS-CoV-2 target nucleic acids are DETECTED.  The SARS-CoV-2 RNA is generally detectable in upper respiratory specimens during the acute phase of infection. Positive results are indicative of the presence of the identified virus, but do not rule out bacterial infection or co-infection with other pathogens not detected by the test. Clinical correlation with patient history and other diagnostic information is necessary to determine patient infection status. The expected result is Negative.  Fact Sheet for Patients: 07/20/22  Fact Sheet for Healthcare Providers: 07/18/22  This test is not yet approved or cleared by the [employer] FDA and  has been authorized for detection and/or diagnosis of SARS-CoV-2 by FDA under an Emergency Use Authorization (EUA).  This EUA will remain in effect (meaning this test can be used) for the duration of  the COVID-19 declaration under Section 564(b)(1) of the A ct,  21 U.S.C. section 360bbb-3(b)(1), unless the authorization is terminated or revoked sooner.     Influenza A by PCR NEGATIVE NEGATIVE Final   Influenza B by PCR NEGATIVE NEGATIVE Final    Comment: (NOTE) The Xpert Xpress SARS-CoV-2/FLU/RSV plus assay is intended as an aid in the diagnosis of influenza from Nasopharyngeal swab specimens and should not be used as a sole basis for treatment. Nasal washings and aspirates are unacceptable for Xpert Xpress SARS-CoV-2/FLU/RSV testing.  Fact Sheet for Patients: [employer]ker[employer]it  Fact Sheet for Healthcare Providers: [place]  This test is not yet approved or cleared by the [employer] FDA and has been  authorized for detection and/or diagnosis of SARS-CoV-2 by FDA under an Emergency Use Authorization (EUA). This EUA will remain in effect (meaning this test can be used) for the duration of the COVID-19 declaration under Section 564(b)(1) of the Act, 21 U.S.C. section 360bbb-3(b)(1), unless the authorization is terminated or revoked.     Resp Syncytial Virus by PCR NEGATIVE NEGATIVE Final    Comment: (NOTE) Fact Sheet for Patients: [employer]Course.com  Fact Sheet for Healthcare Providers: Serious[employer]ker.it  This test is not yet approved or cleared by the [place] FDA and has been authorized for detection and/or diagnosis of SARS-CoV-2 by FDA under an Emergency Use Authorization (EUA). This EUA will remain in effect (meaning this test can be used) for the duration of the COVID-19 declaration under Section 564(b)(1) of the Act, 21 U.S.C. section 360bbb-3(b)(1), unless the authorization is terminated or revoked.  Performed at [facility], [address], [place], [place] [address]          Radiology Studies: CT ABDOMEN PELVIS W CONTRAST  Result Date: 05/20/2022 CLINICAL DATA:  Abdominal pain,  acute, nonlocalized. Nausea/vomiting, upper abd pain. Diagnosed with COVID. EXAM: CT ABDOMEN AND PELVIS WITH CONTRAST TECHNIQUE: Multidetector CT imaging of the abdomen and pelvis was performed using the standard protocol following bolus administration of intravenous contrast. RADIATION DOSE REDUCTION: This exam was performed according to the departmental dose-optimization program which includes automated exposure control, adjustment of the mA and/or kV according to patient size and/or use of iterative reconstruction technique. CONTRAST:  OMNIPAQUE IOHEXOL 300 MG/ML  SOLN COMPARISON:  CT abdomen pelvis 05/10/2022 FINDINGS: Lower chest: No acute abnormality. Hepatobiliary: No focal liver abnormality. No gallstones, gallbladder wall thickening, or pericholecystic fluid. No biliary dilatation. Pancreas: No focal lesion. Normal pancreatic contour. No surrounding inflammatory changes. No main pancreatic ductal dilatation. Spleen: Normal in size without focal abnormality. Adrenals/Urinary Tract: No adrenal nodule bilaterally. Bilateral kidneys enhance symmetrically. No hydronephrosis. No hydroureter. The urinary bladder is unremarkable. Stomach/Bowel: Stomach is within normal limits. No evidence of bowel wall thickening or dilatation. Few scattered colonic diverticula. Appendix appears normal. Vascular/Lymphatic: No abdominal aorta or iliac aneurysm. Mild atherosclerotic plaque of the aorta and its branches. No abdominal, pelvic, or inguinal lymphadenopathy. Reproductive: Prostate is unremarkable. Other: No intraperitoneal free fluid. No intraperitoneal free gas. No organized fluid collection. Musculoskeletal: No abdominal wall hernia or abnormality. No suspicious lytic or blastic osseous lesions. No acute displaced fracture. IMPRESSION: 1. No acute intra-abdominal or intrapelvic abnormality. 2.  Aortic Atherosclerosis (ICD10-I70.0). Electronically Signed   By: Tish Frederickson M.D.   On: 05/20/2022 00:38   DG  Chest 2 View  Result Date: 05/19/2022 CLINICAL DATA:  Chest pain.  Body aches. EXAM: CHEST - 2 VIEW COMPARISON:  Chest radiographs dated January 03, 2022 FINDINGS: The heart size and mediastinal contours are within normal limits. Both lungs are clear. The visualized skeletal structures are unremarkable. IMPRESSION: No active cardiopulmonary disease. Electronically Signed   By: Larose Hires D.O.   On: 05/19/2022 18:22        Scheduled Meds:  amLODipine  10 mg Oral Daily   vitamin C  500 mg Oral Daily   carvedilol  25 mg Oral BID WC   Chlorhexidine Gluconate Cloth  6 each Topical Daily   dexamethasone  6 mg Oral Q24H   enoxaparin (LOVENOX) injection  40 mg Subcutaneous Q24H   folic acid  1 mg Oral Daily   furosemide  40 mg Intravenous Once   hydrALAZINE  100 mg  Oral Q8H   insulin aspart  0-9 Units Subcutaneous Q4H   insulin glargine-yfgn  30 Units Subcutaneous Daily   Ipratropium-Albuterol  1 puff Inhalation Q6H   metoCLOPramide (REGLAN) injection  10 mg Intravenous Q6H   multivitamin with minerals  1 tablet Oral Daily   nirmatrelvir/ritonavir  3 tablet Oral BID   pantoprazole  40 mg Oral BID AC   thiamine  100 mg Oral Daily   zinc sulfate  220 mg Oral Daily   Continuous Infusions:  niCARDipine 5 mg/hr (05/21/22 0800)     LOS: 1 day    Time spent: 40 minutes    Ramiro Harvest, MD [facility]   To contact the attending provider between 7A-7P or the covering provider during after hours 7P-7A, please log into the web site [email or web address] and access using universal Jette password for that web site. If you do not have the password, please call the hospital operator.  05/21/2022, 10:24 AM

## 2022-05-21 NOTE — TOC Initial Note (Signed)
Transition of Care (TOC) - Initial/Assessment Note    Patient Details  Name: Randy [name]. MRN: [id number] Date of Birth: 11-22-1980  Transition [facility] CM/SW Contact:    Roseanne Kaufman, RN Phone Number: 05/21/2022, 2:52 PM  Clinical Narrative:   This RNCM received TOC consult for medication assistance. This RNCM spoke with patient who reports he does not need medication assistance. This RNCM advised due to patient having insurance the hospital will not be able to provide financial assistance, patient is not eligible for MATCH. Patient reports he is able to afford and pick up his medications.   Transportation at discharge: patient reports his family will pick him up.            No current TOC needs at this time, TOC will continue to follow.  Expected Discharge Plan: Home/Self Care Barriers to Discharge: Continued Medical Work up   Patient Goals and CMS Choice     Choice offered to / list presented to : NA      Expected Discharge Plan and Services In-house Referral: NA Discharge Planning Services: CM Consult Post Acute Care Choice: NA Living arrangements for the past 2 months: Apartment                 DME Arranged: N/A DME Agency: NA       HH Arranged: NA HH Agency: NA        Prior Living Arrangements/Services Living arrangements for the past 2 months: Apartment Lives with:: Relatives Patient language and need for interpreter reviewed:: Yes Do you feel safe going back to the place where you live?: Yes      Need for Family Participation in Patient Care: No (Comment) Care giver support system in place?: Yes (comment) Current home services: Other (comment) (n/a) Criminal Activity/Legal Involvement Pertinent to Current Situation/Hospitalization: No - Comment as needed  Activities of Daily Living Home Assistive Devices/Equipment: CBG Meter ADL Screening (condition at time of admission) Patient's cognitive ability adequate to safely complete daily  activities?: Yes Is the patient deaf or have difficulty hearing?: No Does the patient have difficulty seeing, even when wearing glasses/contacts?: Yes Does the patient have difficulty concentrating, remembering, or making decisions?: No Patient able to express need for assistance with ADLs?: Yes Does the patient have difficulty dressing or bathing?: No Independently performs ADLs?: Yes (appropriate for developmental age) Does the patient have difficulty walking or climbing stairs?: No Weakness of Legs: None Weakness of Arms/Hands: None  Permission Sought/Granted Permission sought to share information with : Case Manager Permission granted to share information with : Yes, Verbal Permission Granted  Share Information with NAME: Case manager           Emotional Assessment Appearance:: Appears stated age Attitude/Demeanor/Rapport: Gracious Affect (typically observed): Accepting Orientation: : Oriented to Self, Oriented to Place, Oriented to  Time, Oriented to Situation Alcohol / Substance Use: Illicit Drugs (cannabis) Psych Involvement: No (comment)  Admission diagnosis:  Hypertensive urgency [I16.0] Intractable abdominal pain [R10.9] Intractable nausea and vomiting [R11.2] COVID-19 [U07.1] Patient Active Problem List   Diagnosis Date Noted   Shortness of breath 05/21/2022   COVID-19 virus infection 05/20/2022   Hypomagnesemia 05/20/2022   COVID-19 05/20/2022   Anemia due to chronic kidney disease 03/28/2022   Stage 3a chronic kidney disease (CKD) ([place]) 03/27/2022   Obesity (BMI 30-39.9) 01/06/2022   Hyponatremia 01/04/2022   Acute on chronic pancreatitis ([place]) 01/04/2022   Abdominal pain 09/12/2021   Fall at home, initial encounter 06/18/2021  Acute pancreatitis 09/17/2020   Abnormal urinalysis 09/17/2020   Intractable nausea and vomiting 09/17/2020   Hypertensive crisis 09/12/2020   AKI (acute kidney injury) (HCC) 09/12/2020   GERD (gastroesophageal reflux disease)  09/12/2020   DM2 (diabetes mellitus, type 2) (HCC)    HLD (hyperlipidemia) 06/01/2019   Microalbuminuria due to type 2 diabetes mellitus (HCC) 06/01/2019   Essential hypertension 05/31/2019   Hypertensive urgency 08/31/2015   Type 2 diabetes mellitus with hyperglycemia, with long-term current use of insulin (HCC) 08/31/2015   Cannabis abuse 08/31/2015   PCP:  Rema Fendt, NP Pharmacy:   [id number] - [place]), [place] - [address] [place]) [place] [address] Phone: [phone] Fax: [phone]     Social Determinants of Health (SDOH) Social History: SDOH Screenings   Food Insecurity: Food Insecurity Present (03/27/2022)  Housing: Low Risk  (05/12/2022)  Recent Concern: Housing - Medium Risk (03/27/2022)  Transportation Needs: No Transportation Needs (05/12/2022)  Utilities: Not At Risk (05/12/2022)  Depression (PHQ2-9): Medium Risk (10/02/2021)  Tobacco Use: Medium Risk (05/20/2022)   SDOH Interventions:     Readmission Risk Interventions    05/12/2022    9:27 AM 01/06/2022    1:32 PM 06/20/2021   11:50 AM  Readmission Risk Prevention Plan  Transportation Screening Complete Complete Complete  PCP or Specialist Appt within 3-5 Days  Not Complete Complete  Not Complete comments  pt will need to call for appointment   HRI or Home Care Consult  Complete Complete  Social Work Consult for Recovery Care Planning/Counseling  Complete Complete  Palliative Care Screening  Not Applicable Not Applicable  Medication Review [facility]) Complete Complete Referral to Pharmacy  PCP or Specialist appointment within 3-5 days of discharge Complete    HRI or Home Care Consult Complete    SW Recovery Care/Counseling Consult Complete    Palliative Care Screening Not Applicable    Skilled Nursing Facility Not Applicable

## 2022-05-21 NOTE — Assessment & Plan Note (Signed)
-   Patient with complaints of shortness of breath today states cannot be in the supine position without feeling very short of breath. -Patient also admitted with a COVID-19 infection. -Patient noted to be +1.898 L during this hospitalization. -Saline lock IV fluids. -Check a chest x-ray, BNP. -Lasix 40 mg IV x 1. -Monitor urine output.

## 2022-05-21 NOTE — Discharge Instructions (Signed)

## 2022-05-22 DIAGNOSIS — U071 COVID-19: Secondary | ICD-10-CM | POA: Diagnosis not present

## 2022-05-22 DIAGNOSIS — E1122 Type 2 diabetes mellitus with diabetic chronic kidney disease: Secondary | ICD-10-CM | POA: Diagnosis not present

## 2022-05-22 DIAGNOSIS — I160 Hypertensive urgency: Secondary | ICD-10-CM | POA: Diagnosis not present

## 2022-05-22 DIAGNOSIS — R112 Nausea with vomiting, unspecified: Secondary | ICD-10-CM | POA: Diagnosis not present

## 2022-05-22 LAB — COMPREHENSIVE METABOLIC PANEL
ALT: 21 U/L (ref 0–44)
AST: 19 U/L (ref 15–41)
Albumin: 3.1 g/dL — ABNORMAL LOW (ref 3.5–5.0)
Alkaline Phosphatase: 83 U/L (ref 38–126)
Anion gap: 7 (ref 5–15)
BUN: 34 mg/dL — ABNORMAL HIGH (ref 6–20)
CO2: 22 mmol/L (ref 22–32)
Calcium: 8.1 mg/dL — ABNORMAL LOW (ref 8.9–10.3)
Chloride: 103 mmol/L (ref 98–111)
Creatinine, Ser: 1.9 mg/dL — ABNORMAL HIGH (ref 0.61–1.24)
GFR, Estimated: 45 mL/min — ABNORMAL LOW (ref >60)
Glucose, Bld: 188 mg/dL — ABNORMAL HIGH (ref 70–99)
Potassium: 4.3 mmol/L (ref 3.5–5.1)
Sodium: 132 mmol/L — ABNORMAL LOW (ref 135–145)
Total Bilirubin: 0.2 mg/dL — ABNORMAL LOW (ref 0.3–1.2)
Total Protein: 7 g/dL (ref 6.5–8.1)

## 2022-05-22 LAB — CBC WITH DIFFERENTIAL/PLATELET
Abs Immature Granulocytes: 0.14 10*3/uL — ABNORMAL HIGH (ref 0.00–0.07)
Basophils Absolute: 0 10*3/uL (ref 0.0–0.1)
Basophils Relative: 0 %
Eosinophils Absolute: 0 10*3/uL (ref 0.0–0.5)
Eosinophils Relative: 0 %
HCT: 29.6 % — ABNORMAL LOW (ref 39.0–52.0)
Hemoglobin: 9.6 g/dL — ABNORMAL LOW (ref 13.0–17.0)
Immature Granulocytes: 1 %
Lymphocytes Relative: 8 %
Lymphs Abs: 1.2 10*3/uL (ref 0.7–4.0)
MCH: 27.4 pg (ref 26.0–34.0)
MCHC: 32.4 g/dL (ref 30.0–36.0)
MCV: 84.6 fL (ref 80.0–100.0)
Monocytes Absolute: 0.5 10*3/uL (ref 0.1–1.0)
Monocytes Relative: 3 %
Neutro Abs: 13.4 10*3/uL — ABNORMAL HIGH (ref 1.7–7.7)
Neutrophils Relative %: 88 %
Platelets: 294 10*3/uL (ref 150–400)
RBC: 3.5 MIL/uL — ABNORMAL LOW (ref 4.22–5.81)
RDW: 12.8 % (ref 11.5–15.5)
WBC: 15.2 10*3/uL — ABNORMAL HIGH (ref 4.0–10.5)
nRBC: 0 % (ref 0.0–0.2)

## 2022-05-22 LAB — GLUCOSE, CAPILLARY
Glucose-Capillary: 298 mg/dL — ABNORMAL HIGH (ref 70–99)
Glucose-Capillary: 168 mg/dL — ABNORMAL HIGH (ref 70–99)
Glucose-Capillary: 239 mg/dL — ABNORMAL HIGH (ref 70–99)
Glucose-Capillary: 251 mg/dL — ABNORMAL HIGH (ref 70–99)
Glucose-Capillary: 168 mg/dL — ABNORMAL HIGH (ref 70–99)
Glucose-Capillary: 276 mg/dL — ABNORMAL HIGH (ref 70–99)

## 2022-05-22 LAB — MAGNESIUM: Magnesium: 2.6 mg/dL — ABNORMAL HIGH (ref 1.7–2.4)

## 2022-05-22 LAB — FERRITIN: Ferritin: 254 ng/mL (ref 24–336)

## 2022-05-22 LAB — C-REACTIVE PROTEIN: CRP: 1.5 mg/dL — ABNORMAL HIGH (ref <1.0)

## 2022-05-22 LAB — D-DIMER, QUANTITATIVE: D-Dimer, Quant: 0.31 ug/mL-FEU (ref 0.00–0.50)

## 2022-05-22 LAB — PHOSPHORUS: Phosphorus: 5 mg/dL — ABNORMAL HIGH (ref 2.5–4.6)

## 2022-05-22 MED ORDER — METOCLOPRAMIDE HCL 5 MG/ML IJ SOLN: 10.0000 mg | Freq: Four times a day (QID) | INTRAMUSCULAR | Status: DC | PRN

## 2022-05-22 NOTE — Progress Notes (Signed)
PROGRESS NOTE    Randy [name].  JXB:[id number] DOB: 1980-06-05 DOA: 05/19/2022 PCP: Camillia Herter, NP   Brief Narrative:  Patient 42 year old gentleman history of hypertension, DM2, CKD 3, frequent admissions for intractable nausea vomiting he is from diabetic gastroparesis versus cannabis hyperemesis syndrome versus acute pancreatitis.  Patient recently admitted 12 29-12 31 for intractable nausea and vomiting which improved on Reglan.  Patient presented back to the ED with intractable nausea vomiting x 2 days, epigastric abdominal pain, cough, nasal congestion, exposure to sick contacts whole family had COVID-19 last week.  Patient noted to have tested positive for COVID at home on day of admission.  Patient admitted placed on bowel rest, placed on scheduled Reglan, noted to be in hypertensive urgency placed on a Cardene drip.  Patient also noted to be positive for COVID-19 virus infection.  Patient will be started on Paxlovid.  Statin will be held until course of Paxlovid has been completed.    Assessment and Plan: * Intractable nausea and vomiting -Most likely due to either diabetic gastroparesis, cannabis hyperemesis syndrome, or both an acute pancreatitis as lipase levels noted to be elevated at 75 on admission and up to 118 on repeat lab work.. -DDx includes GI symptoms from his COVID-19, or HTN urgency. -Now showing some clinical improvement. -Continued scheduled Reglan 10 mg every 6 hours but will need to change to as needed to see if he tolerates his diet without it.   -Tolerating clear liquids which we will continue for now and advance as tolerated to full to 95 soft and now regular.  - Saline lock IV fluids as patient with complaints of shortness of breath when supine.    -Continue with pain management, supportive care.   -Continue with IV Dilaudid as needed pain.  Hypertensive urgency -Despite coreg, hydralazine, and amlodipine in ED, patients BP got worse. -Patient was off  Cardene drip yesterday-Cardene drip placed on overnight. -Systolic blood pressures in the 150s, wean Cardene drip to off. -Continue home regimen of Norvasc, hydralazine, Coreg. -He was given dose of Lasix 40 mg IV x1 on 05/21/2022. 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 -CKD 3 from DM and HTN. -Strongly suspect diabetic gastroparesis contributing to N/V in part. -Hemoglobin A1c 9.2 (05/10/2022). -CBGs ranging from 168-239 -Increase Semglee to 30 units daily as patient on steroids.  -Continue with sensitive NovoLog/scale insulin every 4.   COVID-19 virus infection -Mild COVID at this point without significant O2 requirement. -May be contributing to GI symptoms; however, pt comes in very frequently (like once a month) with the same GI symptoms when he doesn't have COVID, so I'm more doubtful that his GI symptoms are totally due to Covid. -Patient with multiple comorbidities including poorly controlled diabetes mellitus, chronic kidney disease, high risk for decompensation. -Inflammatory Marker Trend  Recent Labs    05/20/22 1001 05/21/22 0319 05/22/22 0329  DDIMER 0.34 0.28 0.31  FERRITIN 232 246 254  CRP 2.2* 3.8* 1.5*    Lab Results  Component Value Date   SARSCOV2NAA POSITIVE (A) 05/19/2022   SARSCOV2NAA NEGATIVE 08/01/2021   [place] NEGATIVE 06/19/2021   [place] NEGATIVE 04/01/2021  SpO2: 97 % -Continue Decadron. -Continue Paxlovid, vitamin C, zinc, Decadron, Combivent, PPI. -Airborne and contact precautions  Cannabis abuse -Ongoing, cannabinoid hyperemesis syndrome  is certainly in the differential to his N/V. -Really wish hed quit this for at least a couple of months so we could see if his N/V was at all related, or all diabetic  gastroparesis, or partly each.  HLD (hyperlipidemia) -Continue to hold statin until Paxlovid is completed for COVID-19 infection.   Stage 3a chronic kidney disease (CKD) (HCC) Slight bump in renal function currently at 1.94 however close to baseline. -Saline lock IV fluids --BUN/Cr Trend: Recent Labs  Lab 05/10/22 0324 05/11/22 0847 05/12/22 0844 05/19/22 1847 05/20/22 1001 05/21/22 0319 05/22/22 0329  BUN 23* 26* 17 20 22* 30* 34*  CREATININE 1.72* 2.03* 1.69* 1.59* 1.73* 1.94* 1.90*  -He was given a dose of Lasix 40 mg IV x 1 as patient with complaints of shortness of breath when laying flat. -Avoid Nephrotoxic Medications, Contrast Dyes, Hypotension and Dehydration to Ensure Adequate Renal Perfusion and will need to Renally Adjust Meds -Continue to Monitor and Trend Renal Function carefully and repeat CMP in the AM   Hypoalbuminemia -Patient's albumin level is now 3.1 -Continue monitor and trend and repeat CMP in a.m.  Shortness of breath - Patient with complaints of shortness of breath today states cannot be in the supine position without feeling very short of breath. -Patient also admitted with a COVID-19 infection. -Patient noted to be +1.898 L during this hospitalization yesterday but has not improved and is now -986.4 L -Saline lock IV fluids. -Check a chest x-ray, BNP. -Lasix 40 mg IV x 1 yesterday. -Monitor urine output. -Continue to monitor for signs and symptoms of volume overload and will need an amatory home O2 screen prior to discharge  Leukocytosis -In the setting of steroid demargination -WBC Trend: Recent Labs  Lab 05/10/22 0324 05/11/22 0847 05/19/22 1847 05/20/22 1001 05/21/22 0319 05/22/22 0329  WBC 8.1 9.3 7.1 10.7* 10.7* 15.2*  -Continue to  Monitor for S/Sx of Infection -Repeat CBC in the AM   Hypomagnesemia -Repleted. -Mag Level Trend: Recent Labs  Lab 05/20/22 1001 05/21/22 0319 05/22/22 0329  MG 1.5* 3.1* 2.6*  -Repeat labs in  the AM.  Acute pancreatitis -Patient with complaints of abdominal pain radiating to his back. -Patient presented with nausea and vomiting. -Lipase levels elevated at 75 on presentation, repeat lipase level at 118.. -Still with abdominal pain, requiring round-the-clock IV pain medication of Dilaudid but this is improving.   -Tolerating clears and advance diet as tolerated to orals again into soft but patient requested a regular diet so we will place him on a regular diet.   -Discontinue IV fluids, continue IV antiemetics, supportive care, pain management.   -Saline lock IV fluids the patient with complaints of shortness of breath supine.   Normocytic Anemia -Patient's Hgb/Hct Trend: Recent Labs  Lab 05/10/22 0324 05/11/22 0847 05/19/22 1847 05/20/22 1001 05/21/22 0319 05/22/22 0329  HGB 12.2* 10.4* 11.5* 10.9* 10.4* 9.6*  HCT 37.3* 32.5* 35.0* 32.5* 33.0* 29.6*  MCV 83.1 85.3 83.1 84.0 86.6 84.6  -Check Anemia Panel in the AM   -Continue to Monitor for S/Sx of Bleeding; No overt bleeding noted -Repeat CBC in the AM   GERD (gastroesophageal reflux disease) -PPI.  Overweight -Complicates overall prognosis and care -Estimated body mass index is 28.73 kg/m as calculated from the following:   Height as of this encounter: 5\' 8"  (1.727 m).   Weight as of this encounter: 85.7 kg.  -Weight Loss and Dietary Counseling given  DVT prophylaxis: enoxaparin (LOVENOX) injection 40 mg Start: 05/20/22 1000  Code Status: Full Code Family Communication: No family present at bedside   Disposition Plan:  Level of care: Stepdown Status is: Inpatient Remains inpatient appropriate because: Needs further clinical improvement and tolerance of diet prior to safe discharge disposition   Consultants:  None  Procedures:  As delineated as above  Antimicrobials:  Anti-infectives (From admission, onward)    Start     Dose/Rate Route Frequency Ordered Stop   05/20/22 1600   nirmatrelvir/ritonavir (PAXLOVID) 3 tablet        3 tablet Oral 2 times daily 05/20/22 0855 05/25/22 2159       Subjective: Seen and examined at bedside states he still having some abdominal pain but thinks he is getting a little bit better.  Denies any shortness of breath today.  No nausea or vomiting today.  States that his son has COVID.  No other concerns or complaints at this time.  Objective: Vitals:   05/21/22 2352 05/22/22 0000 05/22/22 0420 05/22/22 0500  BP:      Pulse: 75 64    Resp: 16 13    Temp: 97.9 F (36.6 C)  97.9 F (36.6 C)   TempSrc: Oral  Oral   SpO2: 92% 94%    Weight:    85.7 kg  Height:        Intake/Output Summary (Last 24 hours) at 05/22/2022 0736 Last data filed at 05/21/2022 2353 Gross per 24 hour  Intake 1057.99 ml  Output 1675 ml  Net -617.01 ml   Filed Weights   05/19/22 2357 05/20/22 1300 05/22/22 0500  Weight: 87 kg 85.3 kg 85.7 kg   Examination: Physical Exam:  Constitutional: WN/WD overweight African-American male currently no acute distress Respiratory: Diminished to auscultation bilaterally with coarse breath sounds, no wheezing, rales, rhonchi or crackles. Normal respiratory effort and patient is not tachypenic. No accessory muscle use.  Unlabored breathing Cardiovascular: RRR, no murmurs / rubs / gallops. S1 and S2 auscultated.  Minimal extremity edema Abdomen: Soft, slightly-tender, distended by habitus. Bowel sounds positive.  GU: Deferred. Musculoskeletal: No clubbing / cyanosis of digits/nails. No joint deformity upper and lower extremities.  Skin: No rashes, lesions, ulcers limited skin evaluation. No induration; Warm and dry.  Neurologic: CN 2-12 grossly intact with no focal deficits. Romberg sign and cerebellar reflexes not assessed.  Psychiatric: Normal judgment and insight. Alert and oriented x 3. Normal mood and appropriate affect.   Data Reviewed: I have personally reviewed following labs and imaging  studies  CBC: Recent Labs  Lab 05/19/22 1847 05/20/22 1001 05/21/22 0319 05/22/22 0329  WBC 7.1 10.7* 10.7* 15.2*  NEUTROABS 6.1 9.4* 9.1* 13.4*  HGB 11.5* 10.9* 10.4* 9.6*  HCT 35.0* 32.5* 33.0* 29.6*  MCV 83.1 84.0 86.6 84.6  PLT 357 305 303 294   Basic Metabolic Panel: Recent Labs  Lab 05/19/22 1847 05/20/22 1001 05/21/22 0319 05/22/22 0329  NA 133* 132* 131* 132*  K 4.4 4.1 4.3 4.3  CL 101 100 102 103  CO2 23 22 19* 22  GLUCOSE 223* 287* 284* 188*  BUN 20 22* 30* 34*  CREATININE 1.59* 1.73* 1.94* 1.90*  CALCIUM 9.2 8.8* 8.5* 8.1*  MG  --  1.5* 3.1* 2.6*  PHOS  --  2.7 4.0 5.0*   GFR: Estimated Creatinine Clearance: 53.9 mL/min (A) (by C-G formula based on SCr of 1.9 mg/dL (H)). Liver Function Tests: Recent Labs  Lab 05/19/22 1847 05/20/22 1001 05/21/22 0319 05/22/22 0329  AST 24 24 19 19   ALT 30 27  26 21  ALKPHOS 115 103 98 83  BILITOT 0.5 0.6 0.4 0.2*  PROT 8.2* 7.5 7.8 7.0  ALBUMIN 3.8 3.2* 3.6 3.1*   Recent Labs  Lab 05/19/22 1847 05/20/22 1001 05/21/22 0319  LIPASE 75* 118* 39   No results for input(s): "AMMONIA" in the last 168 hours. Coagulation Profile: No results for input(s): "INR", "PROTIME" in the last 168 hours. Cardiac Enzymes: No results for input(s): "CKTOTAL", "CKMB", "CKMBINDEX", "TROPONINI" in the last 168 hours. BNP (last 3 results) No results for input(s): "PROBNP" in the last 8760 hours. HbA1C: No results for input(s): "HGBA1C" in the last 72 hours. CBG: Recent Labs  Lab 05/21/22 0754 05/21/22 1123 05/21/22 1634 05/21/22 2342 05/22/22 0327  GLUCAP 280* 261* 199* 226* 168*   Lipid Profile: No results for input(s): "CHOL", "HDL", "LDLCALC", "TRIG", "CHOLHDL", "LDLDIRECT" in the last 72 hours. Thyroid Function Tests: No results for input(s): "TSH", "T4TOTAL", "FREET4", "T3FREE", "THYROIDAB" in the last 72 hours. Anemia Panel: Recent Labs    05/21/22 0319 05/22/22 0329  FERRITIN 246 254   Sepsis Labs: Recent  Labs  Lab 05/20/22 1001  PROCALCITON 0.11   Recent Results (from the past 240 hour(s))  Resp panel by RT-PCR (RSV, Flu A&B, Covid) Anterior Nasal Swab     Status: Abnormal   Collection Time: 05/19/22 10:38 PM   Specimen: Anterior Nasal Swab  Result Value Ref Range Status   SARS Coronavirus 2 by RT PCR POSITIVE (A) NEGATIVE Final    Comment: (NOTE) SARS-CoV-2 target nucleic acids are DETECTED.  The SARS-CoV-2 RNA is generally detectable in upper respiratory specimens during the acute phase of infection. Positive results are indicative of the presence of the identified virus, but do not rule out bacterial infection or co-infection with other pathogens not detected by the test. Clinical correlation with patient history and other diagnostic information is necessary to determine patient infection status. The expected result is Negative.  Fact Sheet for Patients: EntrepreneurPulse.com.au  Fact Sheet for Healthcare Providers: IncredibleEmployment.be  This test is not yet approved or cleared by the [place] FDA and  has been authorized for detection and/or diagnosis of SARS-CoV-2 by FDA under an Emergency Use Authorization (EUA).  This EUA will remain in effect (meaning this test can be used) for the duration of  the COVID-19 declaration under Section 564(b)(1) of the A ct, 21 U.S.C. section 360bbb-3(b)(1), unless the authorization is terminated or revoked sooner.     Influenza A by PCR NEGATIVE NEGATIVE Final   Influenza B by PCR NEGATIVE NEGATIVE Final    Comment: (NOTE) The Xpert Xpress SARS-CoV-2/FLU/RSV plus assay is intended as an aid in the diagnosis of influenza from Nasopharyngeal swab specimens and should not be used as a sole basis for treatment. Nasal washings and aspirates are unacceptable for Xpert Xpress SARS-CoV-2/FLU/RSV testing.  Fact Sheet for Patients: EntrepreneurPulse.com.au  Fact Sheet for  Healthcare Providers: IncredibleEmployment.be  This test is not yet approved or cleared by the [place] FDA and has been authorized for detection and/or diagnosis of SARS-CoV-2 by FDA under an Emergency Use Authorization (EUA). This EUA will remain in effect (meaning this test can be used) for the duration of the COVID-19 declaration under Section 564(b)(1) of the Act, 21 U.S.C. section 360bbb-3(b)(1), unless the authorization is terminated or revoked.     Resp Syncytial Virus by PCR NEGATIVE NEGATIVE Final    Comment: (NOTE) Fact Sheet for Patients: EntrepreneurPulse.com.au  Fact Sheet for Healthcare Providers: IncredibleEmployment.be  This test is not yet  approved or cleared by the [place] and has been authorized for detection and/or diagnosis of SARS-CoV-2 by FDA under an Emergency Use Authorization (EUA). This EUA will remain in effect (meaning this test can be used) for the duration of the COVID-19 declaration under Section 564(b)(1) of the Act, 21 U.S.C. section 360bbb-3(b)(1), unless the authorization is terminated or revoked.  Performed at [facility], [place] [address] [place], [place] [address]     Radiology Studies: DG CHEST PORT 1 VIEW  Result Date: 05/21/2022 CLINICAL DATA:  Shortness of breath. EXAM: PORTABLE CHEST 1 VIEW COMPARISON:  05/19/2022 and older FINDINGS: The heart size and mediastinal contours are within normal limits. Both lungs are clear. The visualized skeletal structures are unremarkable. Overlapping cardiac leads. IMPRESSION: No acute cardiopulmonary disease. Electronically Signed   By: Jill Side M.D.   On: 05/21/2022 10:39    Scheduled Meds:  amLODipine  10 mg Oral Daily   vitamin C  500 mg Oral Daily   carvedilol  25 mg Oral BID WC   Chlorhexidine Gluconate Cloth  6 each Topical Daily   dexamethasone  6 mg Oral Q24H   enoxaparin (LOVENOX) injection  40 mg  Subcutaneous [id number]   folic acid  1 mg Oral Daily   hydrALAZINE  100 mg Oral Q8H   insulin aspart  0-9 Units Subcutaneous Q4H   insulin glargine-yfgn  30 Units Subcutaneous Daily   Ipratropium-Albuterol  1 puff Inhalation Q6H   metoCLOPramide (REGLAN) injection  10 mg Intravenous Q6H   multivitamin with minerals  1 tablet Oral Daily   nirmatrelvir/ritonavir  3 tablet Oral BID   pantoprazole  40 mg Oral BID AC   thiamine  100 mg Oral Daily   zinc sulfate  220 mg Oral Daily   Continuous Infusions:  niCARDipine Stopped (05/21/22 1007)    LOS: 2 days   Raiford Noble, DO [facility] Available via Epic secure chat 7am-7pm After these hours, please refer to coverage provider listed on [email or web address] 05/22/2022, 7:36 AM

## 2022-05-22 NOTE — Progress Notes (Signed)
Patient complained of gas. Offered PRN medication for discomfort patient declined. Patient has also not had a BM since admission. Patient refused any PRN medication for BM regimen.

## 2022-05-22 NOTE — Progress Notes (Signed)
Patient requests to be placed on a regular diet. Current order to advance from full liquid to soft diet. Informed Dr. Alfredia Ferguson of patient's diet request. Per Dr. Alfredia Ferguson to advance to regular and assess. If patient intolerant of regular diet, change to soft.

## 2022-05-23 ENCOUNTER — Inpatient Hospital Stay (HOSPITAL_COMMUNITY): Payer: BLUE CROSS/BLUE SHIELD

## 2022-05-23 LAB — CBC WITH DIFFERENTIAL/PLATELET
Abs Immature Granulocytes: 0.12 10*3/uL — ABNORMAL HIGH (ref 0.00–0.07)
Basophils Absolute: 0 10*3/uL (ref 0.0–0.1)
Basophils Relative: 0 %
Eosinophils Absolute: 0 10*3/uL (ref 0.0–0.5)
Eosinophils Relative: 0 %
HCT: 32.1 % — ABNORMAL LOW (ref 39.0–52.0)
Hemoglobin: 10.2 g/dL — ABNORMAL LOW (ref 13.0–17.0)
Immature Granulocytes: 1 %
Lymphocytes Relative: 6 %
Lymphs Abs: 1.1 10*3/uL (ref 0.7–4.0)
MCH: 27.6 pg (ref 26.0–34.0)
MCHC: 31.8 g/dL (ref 30.0–36.0)
MCV: 86.8 fL (ref 80.0–100.0)
Monocytes Absolute: 0.6 10*3/uL (ref 0.1–1.0)
Monocytes Relative: 4 %
Neutro Abs: 15.3 10*3/uL — ABNORMAL HIGH (ref 1.7–7.7)
Neutrophils Relative %: 89 %
Platelets: 336 10*3/uL (ref 150–400)
RBC: 3.7 MIL/uL — ABNORMAL LOW (ref 4.22–5.81)
RDW: 12.8 % (ref 11.5–15.5)
WBC: 17 10*3/uL — ABNORMAL HIGH (ref 4.0–10.5)
nRBC: 0 % (ref 0.0–0.2)

## 2022-05-23 LAB — D-DIMER, QUANTITATIVE: D-Dimer, Quant: 0.32 ug/mL-FEU (ref 0.00–0.50)

## 2022-05-23 LAB — IRON AND TIBC
Iron: 92 ug/dL (ref 45–182)
Saturation Ratios: 46 % — ABNORMAL HIGH (ref 17.9–39.5)
TIBC: 200 ug/dL — ABNORMAL LOW (ref 250–450)
UIBC: 108 ug/dL

## 2022-05-23 LAB — GLUCOSE, CAPILLARY
Glucose-Capillary: 120 mg/dL — ABNORMAL HIGH (ref 70–99)
Glucose-Capillary: 173 mg/dL — ABNORMAL HIGH (ref 70–99)
Glucose-Capillary: 212 mg/dL — ABNORMAL HIGH (ref 70–99)
Glucose-Capillary: 305 mg/dL — ABNORMAL HIGH (ref 70–99)

## 2022-05-23 LAB — FERRITIN: Ferritin: 283 ng/mL (ref 24–336)

## 2022-05-23 LAB — COMPREHENSIVE METABOLIC PANEL
ALT: 21 U/L (ref 0–44)
AST: 22 U/L (ref 15–41)
Albumin: 3.2 g/dL — ABNORMAL LOW (ref 3.5–5.0)
Alkaline Phosphatase: 87 U/L (ref 38–126)
Anion gap: 8 (ref 5–15)
BUN: 36 mg/dL — ABNORMAL HIGH (ref 6–20)
CO2: 23 mmol/L (ref 22–32)
Calcium: 8.2 mg/dL — ABNORMAL LOW (ref 8.9–10.3)
Chloride: 104 mmol/L (ref 98–111)
Creatinine, Ser: 1.75 mg/dL — ABNORMAL HIGH (ref 0.61–1.24)
GFR, Estimated: 49 mL/min — ABNORMAL LOW (ref >60)
Glucose, Bld: 216 mg/dL — ABNORMAL HIGH (ref 70–99)
Potassium: 4.3 mmol/L (ref 3.5–5.1)
Sodium: 135 mmol/L (ref 135–145)
Total Bilirubin: 0.4 mg/dL (ref 0.3–1.2)
Total Protein: 7.3 g/dL (ref 6.5–8.1)

## 2022-05-23 LAB — VITAMIN B12: Vitamin B-12: 906 pg/mL (ref 180–914)

## 2022-05-23 LAB — C-REACTIVE PROTEIN: CRP: 0.9 mg/dL (ref <1.0)

## 2022-05-23 LAB — RETICULOCYTES
Immature Retic Fract: 3.2 % (ref 2.3–15.9)
RBC.: 3.66 MIL/uL — ABNORMAL LOW (ref 4.22–5.81)
Retic Count, Absolute: 35.1 10*3/uL (ref 19.0–186.0)
Retic Ct Pct: 1 % (ref 0.4–3.1)

## 2022-05-23 LAB — FIBRINOGEN: Fibrinogen: 453 mg/dL (ref 210–475)

## 2022-05-23 LAB — SEDIMENTATION RATE: Sed Rate: 48 mm/hr — ABNORMAL HIGH (ref 0–16)

## 2022-05-23 LAB — MAGNESIUM: Magnesium: 2.5 mg/dL — ABNORMAL HIGH (ref 1.7–2.4)

## 2022-05-23 LAB — PHOSPHORUS: Phosphorus: 4 mg/dL (ref 2.5–4.6)

## 2022-05-23 LAB — LACTATE DEHYDROGENASE: LDH: 153 U/L (ref 98–192)

## 2022-05-23 LAB — FOLATE: Folate: 6.4 ng/mL (ref >5.9)

## 2022-05-23 MED ORDER — GUAIFENESIN-DM 100-10 MG/5ML PO SYRP: 10.0000 mL | ORAL_SOLUTION | ORAL | 0 refills | Status: DC | PRN

## 2022-05-23 MED ORDER — IPRATROPIUM-ALBUTEROL 20-100 MCG/ACT IN AERS: 1.0000 | INHALATION_SPRAY | Freq: Four times a day (QID) | RESPIRATORY_TRACT | 0 refills | Status: DC | PRN

## 2022-05-23 MED ORDER — ADULT MULTIVITAMIN W/MINERALS CH: 1.0000 | ORAL_TABLET | Freq: Every day | ORAL | 0 refills | Status: DC

## 2022-05-23 MED ORDER — FOLIC ACID 1 MG PO TABS: 1.0000 mg | ORAL_TABLET | Freq: Every day | ORAL | 0 refills | Status: DC

## 2022-05-23 MED ORDER — ASCORBIC ACID 500 MG PO TABS: 500.0000 mg | ORAL_TABLET | Freq: Every day | ORAL | 0 refills | Status: DC

## 2022-05-23 MED ORDER — NIRMATRELVIR/RITONAVIR (PAXLOVID)TABLET: 3.0000 | ORAL_TABLET | Freq: Two times a day (BID) | ORAL | 0 refills | Status: AC

## 2022-05-23 MED ORDER — ZINC SULFATE 220 (50 ZN) MG PO CAPS: 220.0000 mg | ORAL_CAPSULE | Freq: Every day | ORAL | 0 refills | Status: DC

## 2022-05-23 MED ORDER — HYDROMORPHONE HCL 1 MG/ML IJ SOLN
1.0000 mg | Freq: Once | INTRAMUSCULAR | Status: AC
  Administered 2022-05-23: 1 mg via INTRAVENOUS
  Filled 2022-05-23: qty 1

## 2022-05-23 MED ORDER — ACETAMINOPHEN 325 MG PO TABS: 650.0000 mg | ORAL_TABLET | Freq: Four times a day (QID) | ORAL | 0 refills | Status: DC | PRN

## 2022-05-23 MED ORDER — DEXAMETHASONE 6 MG PO TABS: 6.0000 mg | ORAL_TABLET | ORAL | 0 refills | Status: DC

## 2022-05-23 MED ORDER — VITAMIN B-1 100 MG PO TABS: 100.0000 mg | ORAL_TABLET | Freq: Every day | ORAL | 0 refills | Status: DC

## 2022-05-23 MED ORDER — TRAMADOL HCL 50 MG PO TABS: 50.0000 mg | ORAL_TABLET | Freq: Four times a day (QID) | ORAL | 0 refills | Status: DC | PRN

## 2022-05-23 NOTE — Plan of Care (Signed)
All milestones met, AVS reviewed, awaiting patients mother to arrives for transport patient home. Patients IV removed.

## 2022-05-23 NOTE — Discharge Summary (Signed)
Physician Discharge Summary   Patient: Randy [name]. MRN: [id number] DOB: 25-Nov-1980  Admit date:     05/19/2022  Discharge date: 05/23/22  Discharge Physician: Marguerita Merles, DO   PCP: Rema Fendt, NP   Recommendations at discharge:   Follow-up with PCP within 1 to 2 weeks repeat CBC, CMP, mag, Phos within 1 week Follow-up with endocrinology in outpatient setting Have PCP refer for outpatient nephrology evaluation Avoid cannabis use Follow-up with gastroenterology in outpatient setting for pancreatitis follow-up  Discharge Diagnoses: Principal Problem:   Intractable nausea and vomiting Active Problems:   Hypertensive urgency   DM2 (diabetes mellitus, type 2) (HCC)   COVID-19 virus infection   Cannabis abuse   HLD (hyperlipidemia)   Stage 3a chronic kidney disease (CKD) (HCC)   GERD (gastroesophageal reflux disease)   Acute pancreatitis   Hypomagnesemia   COVID-19   Shortness of breath  Resolved Problems:   * No resolved hospital problems. [facility] Course: Patient 42 year old gentleman history of hypertension, DM2, CKD 3, frequent admissions for intractable nausea vomiting he is from diabetic gastroparesis versus cannabis hyperemesis syndrome versus acute pancreatitis.  Patient recently admitted 12 29-12 31 for intractable nausea and vomiting which improved on Reglan.  Patient presented back to the ED with intractable nausea vomiting x 2 days, epigastric abdominal pain, cough, nasal congestion, exposure to sick contacts whole family had COVID-19 last week.  Patient noted to have tested positive for COVID at home on day of admission.  Patient admitted placed on bowel rest, placed on scheduled Reglan, noted to be in hypertensive urgency placed on a Cardene drip.  Patient also noted to be positive for COVID-19 virus infection.  Patient will be started on Paxlovid.  Statin will be held until course of Paxlovid has been completed.  **Interim History Patient subsequently  improved and abdominal pain was improved.  He will be continuing his Paxlovid dose at home.  Tolerated his diet without issues and had no nausea or vomiting.  Pain was fairly well-controlled.  He is medically stable for discharge at this time will need to be following up with his PCP, endocrinologist as well as outpatient follow-up for nephrology given his CKD.  He will need strict blood pressure and blood sugar management.  He is done well and medically stable to be discharged home at this time with outpatient follow-up with primary care as well as nephrology and endocrinology.  Assessment and Plan: * Intractable nausea and vomiting -Most likely due to either diabetic gastroparesis, cannabis hyperemesis syndrome, or both an acute pancreatitis as lipase levels noted to be elevated at 75 on admission and up to 118 on repeat lab work.. -DDx includes GI symptoms from his COVID-19, or HTN urgency. -Now showing some clinical improvement. -Continued scheduled Reglan 10 mg every 6 hours but will need to change to as needed to see if he tolerates his diet without it.   -Tolerating clear liquids which we will continue for now and advance as tolerated to full to a soft and now regular.   Saline lock IV fluids as patient with complaints of shortness of breath when supine.    -Continue with pain management, supportive care.   -Continue with IV Dilaudid as needed pain and changed to po for D/C  -Tolerated Meal without issue and medically stable for D/C    Hypertensive urgency -Despite coreg, hydralazine, and amlodipine in ED, patients BP got worse. -Patient was off Cardene drip yesterday-Cardene drip placed on overnight. -Systolic blood  pressures in the 150s, wean Cardene drip to off. -Continue home regimen of Norvasc, hydralazine, Coreg. -He was given dose of Lasix 40 mg IV x1 on 05/21/2022. 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 -CKD 3 from DM and HTN. -Strongly suspect diabetic gastroparesis contributing to N/V in part. -Hemoglobin A1c 9.2 (05/10/2022). -CBGs ranging from 120-305 -Increase Semglee to 30 units daily as patient on steroids.  -Continue with sensitive NovoLog/scale insulin every 4.    COVID-19 virus infection -Mild COVID at this point without significant O2 requirement. -May be contributing to GI symptoms; however, pt comes in very frequently (like once a month) with the same GI symptoms when he doesn't have COVID, so I'm more doubtful that his GI symptoms are totally due to Covid. -Patient with multiple comorbidities including poorly controlled diabetes mellitus, chronic kidney disease, high risk for decompensation. -Inflammatory Marker Trend  COVID-19 Labs -CRP was 0.9 at the time of D/C   Lab Results  Component Value Date   SARSCOV2NAA POSITIVE (A) 05/19/2022   SARSCOV2NAA NEGATIVE 08/01/2021   SARSCOV2NAA NEGATIVE 06/19/2021   SARSCOV2NAA NEGATIVE 04/01/2021    -SpO2: 97 % -Continue Decadron until completeion . -Continue Paxlovid, vitamin C, zinc, Decadron, Combivent, PPI. -Airborne and contact precautions   Cannabis Abuse -Ongoing, cannabinoid hyperemesis syndrome is certainly in the differential to his N/V. -Really wish hed quit this for at least a couple of months so we could see if his N/V was at all related, or all diabetic gastroparesis, or partly each.   HLD (hyperlipidemia) -Continue to hold statin until Paxlovid is completed for COVID-19 infection.    Stage 3a chronic kidney disease (CKD) (HCC) Slight bump in renal function currently at 1.94 however close to baseline. -Saline lock IV fluids -BUN/Cr Trend: Recent Labs  Lab 05/12/22 0844 05/19/22 1847 05/20/22 1001 05/21/22 0319 05/22/22 0329 05/23/22 0256  05/25/22 2109  BUN 17 20 22* 30* 34* 36* 34*  CREATININE 1.69* 1.59* 1.73* 1.94* 1.90* 1.75* 1.48*  -He was given a dose of Lasix 40 mg IV x 1 as patient with complaints of shortness of breath when laying flat. -Avoid Nephrotoxic Medications, Contrast Dyes, Hypotension and Dehydration to Ensure Adequate Renal Perfusion and will need to Renally Adjust Meds -Continue to Monitor and Trend Renal Function carefully and repeat CMP within 1 week   Hypoalbuminemia -Patient's albumin level trend: Recent Labs  Lab 05/11/22 0847 05/19/22 1847 05/20/22 1001 05/21/22 0319 05/22/22 0329 05/23/22 0256 05/25/22 2109  ALBUMIN 3.3* 3.8 3.2* 3.6 3.1* 3.2* 3.1*  -Continue monitor and trend and repeat CMP in a.m.   Shortness of breath, improved  - Patient with complaints of shortness of breath a few days ago states cannot be in the supine position without feeling very short of breath. -Patient also admitted with a COVID-19 infection. -Patient noted to be +1.898 L during this hospitalization yesterday but has not improved and is now -986.4 L -Saline lock IV fluids. -Check a chest x-ray, BNP was 110.9 -CXR done and showed ". No active cardiopulmonary disease is radiographically apparent  -Lasix 40 mg IV x 1 the day bevore yesterday. -Monitor urine output. -Continue to monitor for signs and symptoms of volume overload and will need an amatory home O2 screen prior to discharge and he did not desaturate  Leukocytosis -In the setting of steroid demargination -WBC Trend: Recent Labs  Lab 05/11/22 0847 05/19/22 1847 05/20/22 1001 05/21/22 0319 05/22/22 0329 05/23/22 0256 05/25/22 2109  WBC 9.3 7.1 10.7* 10.7* 15.2* 17.0* 13.5*  -Continue to  Monitor for S/Sx of Infection -Repeat CBC in the AM    Hypomagnesemia -Repleted. -Mag Level Trend: Recent Labs  Lab 05/20/22 1001 05/21/22 0319 05/22/22 0329 05/23/22 0256  MG 1.5* 3.1* 2.6* 2.5*  -Repeat labs in the AM.   Acute  pancreatitis -Patient with complaints of abdominal pain radiating to his back. -Patient presented with nausea and vomiting. -Lipase levels elevated at 75 on presentation, repeat lipase level at 11 and trended down to 39 -Abdominal Pain Improved and stabilized  -Tolerating clears and advance diet as tolerated to orals again into soft but patient requested a regular diet so we will place him on a regular diet.   -Discontinue IV fluids, continue IV antiemetics, supportive care, pain management.   -Tolerated has diet without issues and is medically stable for discharge -Saline lock IV fluids the patient with complaints of shortness of breath supine.    Normocytic Anemia -Patient's Hgb/Hct Trend: Recent Labs  Lab 05/11/22 0847 05/19/22 1847 05/20/22 1001 05/21/22 0319 05/22/22 0329 05/23/22 0256 05/25/22 2109  HGB 10.4* 11.5* 10.9* 10.4* 9.6* 10.2* 10.5*  HCT 32.5* 35.0* 32.5* 33.0* 29.6* 32.1* 32.7*  MCV 85.3 83.1 84.0 86.6 84.6 86.8 86.1  -Checked Anemia Panel showed an Iron Level of 92, UIBC of 108, TIBC of 200, saturation ratios of 46%, ferritin level 283, folate level 6.4, and Vitamin B12 of 906 -Continue to Monitor for S/Sx of Bleeding; No overt bleeding noted -Repeat CBC within 1-2 weeks    GERD (gastroesophageal reflux disease) -PPI.   Overweight -Complicates overall prognosis and care -Estimated body mass index is 28.73 kg/m as calculated from the following:   Height as of this encounter: 5\' 8"  (1.727 m).   Weight as of this encounter: 85.7 kg.  -Weight Loss and Dietary Counseling given    Pain control - [place] Controlled Substance Reporting System database was reviewed. and patient was instructed, not to drive, operate heavy machinery, perform activities at heights, swimming or participation in water activities or provide baby-sitting services while on Pain, Sleep and Anxiety Medications; until their outpatient Physician has advised to do so again. Also  recommended to not to take more than prescribed Pain, Sleep and Anxiety Medications.   Consultants: None Procedures performed: As delineated as above Disposition: Home Diet recommendation:  Discharge Diet Orders (From admission, onward)     Start     Ordered   05/23/22 0000  Diet Carb Modified        05/23/22 1308   05/23/22 0000  Diet - low sodium heart healthy        05/23/22 1308           Cardiac and Carb modified diet DISCHARGE MEDICATION: Allergies as of 05/23/2022       Reactions   Tomato Itching        Medication List     STOP taking these medications    metoCLOPramide 5 MG tablet Commonly known as: REGLAN       TAKE these medications    Accu-Chek Guide test strip Generic drug: glucose blood Use to check blood sugar 3 times daily.   acetaminophen 325 MG tablet Commonly known as: TYLENOL Take 2 tablets (650 mg total) by mouth every 6 (six) hours as needed for mild pain (  or Fever >/= 101).   amLODipine 5 MG tablet Commonly known as: NORVASC Take 2 tablets (10 mg total) by mouth daily. What changed:  how much to take when to take this   ascorbic acid 500 MG tablet Commonly known as: VITAMIN C Take 1 tablet (500 mg total) by mouth daily. Start taking on: May 24, 2022   atorvastatin 40 MG tablet Commonly known as: LIPITOR Take 2 tablets (80 mg total) by mouth daily.   Basaglar KwikPen 100 UNIT/ML Inject 18 Units into the skin daily. What changed:  how much to take when to take this additional instructions   carvedilol 25 MG tablet Commonly known as: COREG Take 1 tablet (25 mg total) by mouth 2 (two) times daily with a meal.   dexamethasone 6 MG tablet Commonly known as: DECADRON Take 1 tablet (6 mg total) by mouth daily for 6 days. Start taking on: May 24, 2022   folic acid 1 MG tablet Commonly known as: FOLVITE Take 1 tablet (1 mg total) by mouth daily. Start taking on: May 24, 2022   guaiFENesin-dextromethorphan  100-10 MG/5ML syrup Commonly known as: ROBITUSSIN DM Take 10 mLs by mouth every 4 (four) hours as needed for cough.   hydrALAZINE 100 MG tablet Commonly known as: APRESOLINE Take 1 tablet (100 mg total) by mouth every 8 (eight) hours.   Ipratropium-Albuterol 20-100 MCG/ACT Aers respimat Commonly known as: COMBIVENT Inhale 1 puff into the lungs every 6 (six) hours as needed for wheezing or shortness of breath.   multivitamin with minerals Tabs tablet Take 1 tablet by mouth daily. Start taking on: May 24, 2022   nirmatrelvir/ritonavir 20 x 150 MG & 10 x 100MG  Tabs Commonly known as: PAXLOVID Take 3 tablets by mouth 2 (two) times daily for 2 days. Patient GFR is 58.6. Take nirmatrelvir (150 mg) two tablets twice daily for 5 days and ritonavir (100 mg) one tablet twice daily for 5 days.   ondansetron 4 MG disintegrating tablet Commonly known as: ZOFRAN-ODT Take 1 tablet (4 mg total) by mouth every 8 (eight) hours as needed for nausea or vomiting.   pantoprazole 40 MG tablet Commonly known as: Protonix Take 1 tablet (40 mg total) by mouth 2 (two) times daily. What changed: when to take this   thiamine 100 MG tablet Commonly known as: Vitamin B-1 Take 1 tablet (100 mg total) by mouth daily. Start taking on: May 24, 2022   traMADol 50 MG tablet Commonly known as: ULTRAM Take 1 tablet (50 mg total) by mouth every 6 (six) hours as needed.   TRUEplus 5-Bevel Pen Needles 32G X 4 MM Misc Generic drug: Insulin Pen Needle Use to inject basaglar once daily.   zinc sulfate 220 (50 Zn) MG capsule Take 1 capsule (220 mg total) by mouth daily. Start taking on: May 24, 2022        Discharge Exam: May 26, 2022 Weights   05/19/22 2357 05/20/22 1300 05/22/22 0500  Weight: 87 kg 85.3 kg 85.7 kg   Vitals:   05/23/22 1135 05/23/22 1200  BP:  (!) 185/91  Pulse:  82  Resp:  19  Temp: 98.7 F (37.1 C)   SpO2:  97%   Examination: Physical Exam:  Constitutional: WN/WD  overweight AAM in NAD  Respiratory: Diminished to auscultation with coarse breath sounds bilaterally, no wheezing, rales, rhonchi or crackles. Normal respiratory effort and patient is not tachypenic. No accessory muscle use. Unlabored breathing  Cardiovascular: RRR, no murmurs / rubs / gallops. S1  and S2 auscultated.  Abdomen: Soft, non-tender, distended due to body habitus bowel sounds present  Musculoskeletal: No clubbing / cyanosis of digits/nails. No joint deformity upper and lower extremities.  Skin: No rashes, lesions, ulcers on a limited skin evaluation. No induration; Warm and dry.  Neurologic: Patient is blind in his left eye but has no other reportable focal deficits Romberg sign cerebellar reflexes not assessed.  Psychiatric: Normal judgment and insight. Alert and oriented x 3. Normal mood and appropriate affect.   Condition at discharge: stable  The results of significant diagnostics from this hospitalization (including imaging, microbiology, ancillary and laboratory) are listed below for reference.   Imaging Studies: DG CHEST PORT 1 VIEW  Result Date: 05/23/2022 CLINICAL DATA:  Shortness of breath.  COVID infection. EXAM: PORTABLE CHEST 1 VIEW COMPARISON:  05/21/2022 FINDINGS: The lungs appear clear. Cardiac and mediastinal contours normal. No pleural effusion identified. IMPRESSION: 1.  No active cardiopulmonary disease is radiographically apparent. Electronically Signed   By: Gaylyn RongWalter  Liebkemann M.D.   On: 05/23/2022 07:31   DG CHEST PORT 1 VIEW  Result Date: 05/21/2022 CLINICAL DATA:  Shortness of breath. EXAM: PORTABLE CHEST 1 VIEW COMPARISON:  05/19/2022 and older FINDINGS: The heart size and mediastinal contours are within normal limits. Both lungs are clear. The visualized skeletal structures are unremarkable. Overlapping cardiac leads. IMPRESSION: No acute cardiopulmonary disease. Electronically Signed   By: Karen KaysAshok  Gupta M.D.   On: 05/21/2022 10:39   CT ABDOMEN PELVIS W  CONTRAST  Result Date: 05/20/2022 CLINICAL DATA:  Abdominal pain, acute, nonlocalized. Nausea/vomiting, upper abd pain. Diagnosed with COVID. EXAM: CT ABDOMEN AND PELVIS WITH CONTRAST TECHNIQUE: Multidetector CT imaging of the abdomen and pelvis was performed using the standard protocol following bolus administration of intravenous contrast. RADIATION DOSE REDUCTION: This exam was performed according to the departmental dose-optimization program which includes automated exposure control, adjustment of the mA and/or kV according to patient size and/or use of iterative reconstruction technique. CONTRAST:  100mL OMNIPAQUE IOHEXOL 300 MG/ML  SOLN COMPARISON:  CT abdomen pelvis 05/10/2022 FINDINGS: Lower chest: No acute abnormality. Hepatobiliary: No focal liver abnormality. No gallstones, gallbladder wall thickening, or pericholecystic fluid. No biliary dilatation. Pancreas: No focal lesion. Normal pancreatic contour. No surrounding inflammatory changes. No main pancreatic ductal dilatation. Spleen: Normal in size without focal abnormality. Adrenals/Urinary Tract: No adrenal nodule bilaterally. Bilateral kidneys enhance symmetrically. No hydronephrosis. No hydroureter. The urinary bladder is unremarkable. Stomach/Bowel: Stomach is within normal limits. No evidence of bowel wall thickening or dilatation. Few scattered colonic diverticula. Appendix appears normal. Vascular/Lymphatic: No abdominal aorta or iliac aneurysm. Mild atherosclerotic plaque of the aorta and its branches. No abdominal, pelvic, or inguinal lymphadenopathy. Reproductive: Prostate is unremarkable. Other: No intraperitoneal free fluid. No intraperitoneal free gas. No organized fluid collection. Musculoskeletal: No abdominal wall hernia or abnormality. No suspicious lytic or blastic osseous lesions. No acute displaced fracture. IMPRESSION: 1. No acute intra-abdominal or intrapelvic abnormality. 2.  Aortic Atherosclerosis (ICD10-I70.0). Electronically  Signed   By: Tish FredericksonMorgane  Naveau M.D.   On: 05/20/2022 00:38   DG Chest 2 View  Result Date: 05/19/2022 CLINICAL DATA:  Chest pain.  Body aches. EXAM: CHEST - 2 VIEW COMPARISON:  Chest radiographs dated January 03, 2022 FINDINGS: The heart size and mediastinal contours are within normal limits. Both lungs are clear. The visualized skeletal structures are unremarkable. IMPRESSION: No active cardiopulmonary disease. Electronically Signed   By: Larose HiresImran  Ahmed D.O.   On: 05/19/2022 18:22   CT ABDOMEN PELVIS W CONTRAST  Result Date: 05/10/2022 CLINICAL DATA:  24 hours of lower abdominal pain and vomiting. EXAM: CT ABDOMEN AND PELVIS WITH CONTRAST TECHNIQUE: Multidetector CT imaging of the abdomen and pelvis was performed using the standard protocol following bolus administration of intravenous contrast. RADIATION DOSE REDUCTION: This exam was performed according to the departmental dose-optimization program which includes automated exposure control, adjustment of the mA and/or kV according to patient size and/or use of iterative reconstruction technique. CONTRAST:  OMNIPAQUE IOHEXOL 300 MG/ML  SOLN COMPARISON:  Multiple priors including CT March 27, 2022 FINDINGS: Lower chest: No acute abnormality. Similar mild symmetric wall thickening at the gastroesophageal junction. Hepatobiliary: No suspicious hepatic lesion. Probable adenomyomatosis of the gallbladder fundus. No evidence of gallbladder inflammation. No biliary ductal dilation. Pancreas: No pancreatic ductal dilation or evidence of acute inflammation. Spleen: No splenomegaly or focal splenic lesion. Adrenals/Urinary Tract: Bilateral adrenal glands appear normal. No hydronephrosis. Kidneys demonstrate symmetric enhancement. Urinary bladder is unremarkable for degree of distension. Stomach/Bowel: Wall thickening versus underdistention of the gastric cardia and antrum. No pathologic dilation of small or large bowel. The appendix and terminal ileum appear  normal. Left-sided diverticulosis without findings of acute diverticulitis. Vascular/Lymphatic: Normal caliber abdominal aorta. No pathologically enlarged abdominal or pelvic lymph nodes. Reproductive: Enlarged prostate gland. Other: No significant abdominopelvic free fluid. Musculoskeletal: No acute osseous abnormality. IMPRESSION: 1. Wall thickening versus underdistention of the gastric cardia and antrum. Correlate for gastritis. 2. Mild symmetric wall thickening of the gastroesophageal junction may reflect sequela of emesis or esophagitis. 3. Left-sided diverticulosis without findings of acute diverticulitis. 4. Enlarged prostate gland. Electronically Signed   By: Maudry Mayhew M.D.   On: 05/10/2022 12:59    Microbiology: Results for orders placed or performed during the hospital encounter of 05/19/22  Resp panel by RT-PCR (RSV, Flu A&B, Covid) Anterior Nasal Swab     Status: Abnormal   Collection Time: 05/19/22 10:38 PM   Specimen: Anterior Nasal Swab  Result Value Ref Range Status   SARS Coronavirus 2 by RT PCR POSITIVE (A) NEGATIVE Final    Comment: (NOTE) SARS-CoV-2 target nucleic acids are DETECTED.  The SARS-CoV-2 RNA is generally detectable in upper respiratory specimens during the acute phase of infection. Positive results are indicative of the presence of the identified virus, but do not rule out bacterial infection or co-infection with other pathogens not detected by the test. Clinical correlation with patient history and other diagnostic information is necessary to determine patient infection status. The expected result is Negative.  Fact Sheet for Patients: BloggerCourse.com  Fact Sheet for Healthcare Providers: SeriousBroker.it  This test is not yet approved or cleared by the [place] FDA and  has been authorized for detection and/or diagnosis of SARS-CoV-2 by FDA under an Emergency Use Authorization (EUA).  This EUA  will remain in effect (meaning this test can be used) for the duration of  the COVID-19 declaration under Section 564(b)(1) of the A ct, 21 U.S.C. section 360bbb-3(b)(1), unless the authorization is terminated or revoked sooner.     Influenza A by PCR NEGATIVE NEGATIVE Final   Influenza B by PCR NEGATIVE NEGATIVE Final    Comment: (NOTE) The Xpert Xpress SARS-CoV-2/FLU/RSV plus assay is intended as an aid in the diagnosis of influenza from Nasopharyngeal swab specimens and should not be used as a sole basis for treatment. Nasal washings and aspirates are unacceptable for Xpert Xpress SARS-CoV-2/FLU/RSV testing.  Fact Sheet for Patients: BloggerCourse.com  Fact Sheet for Healthcare Providers: SeriousBroker.it  This test is not  yet approved or cleared by the [place] and has been authorized for detection and/or diagnosis of SARS-CoV-2 by FDA under an Emergency Use Authorization (EUA). This EUA will remain in effect (meaning this test can be used) for the duration of the COVID-19 declaration under Section 564(b)(1) of the Act, 21 U.S.C. section 360bbb-3(b)(1), unless the authorization is terminated or revoked.     Resp Syncytial Virus by PCR NEGATIVE NEGATIVE Final    Comment: (NOTE) Fact Sheet for Patients: EntrepreneurPulse.com.au  Fact Sheet for Healthcare Providers: IncredibleEmployment.be  This test is not yet approved or cleared by the [place] FDA and has been authorized for detection and/or diagnosis of SARS-CoV-2 by FDA under an Emergency Use Authorization (EUA). This EUA will remain in effect (meaning this test can be used) for the duration of the COVID-19 declaration under Section 564(b)(1) of the Act, 21 U.S.C. section 360bbb-3(b)(1), unless the authorization is terminated or revoked.  Performed at [facility], [place] [address] [place],  [place] [address]    Labs: CBC: Recent Labs  Lab 05/19/22 1847 05/20/22 1001 05/21/22 0319 05/22/22 0329 05/23/22 0256  WBC 7.1 10.7* 10.7* 15.2* 17.0*  NEUTROABS 6.1 9.4* 9.1* 13.4* 15.3*  HGB 11.5* 10.9* 10.4* 9.6* 10.2*  HCT 35.0* 32.5* 33.0* 29.6* 32.1*  MCV 83.1 84.0 86.6 84.6 86.8  PLT 357 305 303 294 768   Basic Metabolic Panel: Recent Labs  Lab 05/19/22 1847 05/20/22 1001 05/21/22 0319 05/22/22 0329 05/23/22 0256  NA 133* 132* 131* 132* 135  K 4.4 4.1 4.3 4.3 4.3  CL 101 100 102 103 104  CO2 23 22 19* 22 23  GLUCOSE 223* 287* 284* 188* 216*  BUN 20 22* 30* 34* 36*  CREATININE 1.59* 1.73* 1.94* 1.90* 1.75*  CALCIUM 9.2 8.8* 8.5* 8.1* 8.2*  MG  --  1.5* 3.1* 2.6* 2.5*  PHOS  --  2.7 4.0 5.0* 4.0   Liver Function Tests: Recent Labs  Lab 05/19/22 1847 05/20/22 1001 05/21/22 0319 05/22/22 0329 05/23/22 0256  AST 24 24 19 19 22   ALT 30 27 26 21 21   ALKPHOS 115 103 98 83 87  BILITOT 0.5 0.6 0.4 0.2* 0.4  PROT 8.2* 7.5 7.8 7.0 7.3  ALBUMIN 3.8 3.2* 3.6 3.1* 3.2*   CBG: Recent Labs  Lab 05/22/22 1953 05/23/22 0009 05/23/22 0346 05/23/22 0809 05/23/22 1130  GLUCAP 276* 212* 173* 120* 305*   Discharge time spent: greater than 30 minutes.  Signed: [place] [place], DO [facility] 05/23/2022

## 2022-05-23 NOTE — TOC Transition Note (Signed)
Transition of Care [facility]) - CM/SW Discharge Note   Patient Details  Name: Randy [name]. MRN: [id number] Date of Birth: 09/02/1980  Transition of Care [facility]) CM/SW Contact:  Roseanne Kaufman, RN Phone Number: 05/23/2022, 2:11 PM   Clinical Narrative:    No current TOC needs.   Transition of Care Department [facility]) has reviewed patient and no TOC needs have been identified at this time. We will continue to monitor patient advancement through interdisciplinary progression rounds. If new patient transition needs arise, please place a TOC consult.     Barriers to Discharge: Continued Medical Work up   Patient Goals and CMS Choice   Choice offered to / list presented to : NA  Discharge Placement                         Discharge Plan and Services Additional resources added to the After Visit Summary for   In-house Referral: NA Discharge Planning Services: CM Consult Post Acute Care Choice: NA          DME Arranged: N/A DME Agency: NA       HH Arranged: NA HH Agency: NA        Social Determinants of Health (SDOH) Interventions [place]: Food Insecurity Present (03/27/2022)  Housing: Low Risk  (05/12/2022)  Recent Concern: Housing - Medium Risk (03/27/2022)  Transportation Needs: No Transportation Needs (05/12/2022)  Utilities: Not At Risk (05/12/2022)  Depression (PHQ2-9): Medium Risk (10/02/2021)  Tobacco Use: Medium Risk (05/20/2022)     Readmission Risk Interventions    05/12/2022    9:27 AM 01/06/2022    1:32 PM 06/20/2021   11:50 AM  Readmission Risk Prevention Plan  Transportation Screening Complete Complete Complete  PCP or Specialist Appt within 3-5 Days  Not Complete Complete  Not Complete comments  pt will need to call for appointment   [place] or [place]  Complete Complete  Social Work Consult for [place] Planning/Counseling  Complete Complete  Palliative Care Screening  Not Applicable Not Applicable   Medication Review Press photographer) Complete Complete Referral to Pharmacy  PCP or Specialist appointment within 3-5 days of discharge Complete    HRI or [place] Complete    SW Recovery Care/Counseling Consult Complete    [place] Not Applicable

## 2022-05-24 ENCOUNTER — Telehealth: Payer: Self-pay

## 2022-05-24 NOTE — Telephone Encounter (Signed)
Transition Care Management Unsuccessful Follow-up Telephone Call  Date of discharge and from where:  Randy [name] 05/23/2022  Attempts:  1st Attempt  Reason for unsuccessful TCM follow-up call:  Left voice message [name], [place] Direct Dial [phone]

## 2022-05-25 ENCOUNTER — Emergency Department (HOSPITAL_COMMUNITY)
Admission: EM | Admit: 2022-05-25 | Discharge: 2022-05-26 | Disposition: A | Discharge status: Home / Self Care | Payer: BLUE CROSS/BLUE SHIELD | Attending: Emergency Medicine | Admitting: Emergency Medicine

## 2022-05-25 ENCOUNTER — Other Ambulatory Visit: Payer: Self-pay

## 2022-05-25 ENCOUNTER — Encounter (HOSPITAL_COMMUNITY): Payer: Self-pay | Admitting: Emergency Medicine

## 2022-05-25 DIAGNOSIS — R112 Nausea with vomiting, unspecified: Secondary | ICD-10-CM

## 2022-05-25 DIAGNOSIS — I10 Essential (primary) hypertension: Secondary | ICD-10-CM

## 2022-05-25 DIAGNOSIS — N1831 Chronic kidney disease, stage 3a: Secondary | ICD-10-CM | POA: Insufficient documentation

## 2022-05-25 DIAGNOSIS — Z87891 Personal history of nicotine dependence: Secondary | ICD-10-CM | POA: Diagnosis not present

## 2022-05-25 DIAGNOSIS — G8929 Other chronic pain: Secondary | ICD-10-CM | POA: Diagnosis not present

## 2022-05-25 DIAGNOSIS — Z794 Long term (current) use of insulin: Secondary | ICD-10-CM | POA: Diagnosis not present

## 2022-05-25 DIAGNOSIS — E871 Hypo-osmolality and hyponatremia: Secondary | ICD-10-CM | POA: Insufficient documentation

## 2022-05-25 DIAGNOSIS — Z8616 Personal history of COVID-19: Secondary | ICD-10-CM | POA: Insufficient documentation

## 2022-05-25 DIAGNOSIS — Z79899 Other long term (current) drug therapy: Secondary | ICD-10-CM | POA: Insufficient documentation

## 2022-05-25 DIAGNOSIS — R109 Unspecified abdominal pain: Secondary | ICD-10-CM | POA: Diagnosis not present

## 2022-05-25 DIAGNOSIS — D631 Anemia in chronic kidney disease: Secondary | ICD-10-CM | POA: Diagnosis not present

## 2022-05-25 DIAGNOSIS — E1165 Type 2 diabetes mellitus with hyperglycemia: Secondary | ICD-10-CM

## 2022-05-25 DIAGNOSIS — I129 Hypertensive chronic kidney disease with stage 1 through stage 4 chronic kidney disease, or unspecified chronic kidney disease: Secondary | ICD-10-CM | POA: Diagnosis not present

## 2022-05-25 DIAGNOSIS — E1122 Type 2 diabetes mellitus with diabetic chronic kidney disease: Secondary | ICD-10-CM | POA: Insufficient documentation

## 2022-05-25 DIAGNOSIS — K219 Gastro-esophageal reflux disease without esophagitis: Secondary | ICD-10-CM | POA: Diagnosis not present

## 2022-05-25 LAB — COMPREHENSIVE METABOLIC PANEL
ALT: 24 U/L (ref 0–44)
AST: 29 U/L (ref 15–41)
Albumin: 3.1 g/dL — ABNORMAL LOW (ref 3.5–5.0)
Alkaline Phosphatase: 99 U/L (ref 38–126)
Anion gap: 10 (ref 5–15)
BUN: 34 mg/dL — ABNORMAL HIGH (ref 6–20)
CO2: 20 mmol/L — ABNORMAL LOW (ref 22–32)
Calcium: 7.9 mg/dL — ABNORMAL LOW (ref 8.9–10.3)
Chloride: 97 mmol/L — ABNORMAL LOW (ref 98–111)
Creatinine, Ser: 1.48 mg/dL — ABNORMAL HIGH (ref 0.61–1.24)
GFR, Estimated: >60 mL/min (ref >60)
Glucose, Bld: 489 mg/dL — ABNORMAL HIGH (ref 70–99)
Potassium: 4.4 mmol/L (ref 3.5–5.1)
Sodium: 127 mmol/L — ABNORMAL LOW (ref 135–145)
Total Bilirubin: 0.4 mg/dL (ref 0.3–1.2)
Total Protein: 6.4 g/dL — ABNORMAL LOW (ref 6.5–8.1)

## 2022-05-25 LAB — NA AND K (SODIUM & POTASSIUM), RAND UR
Potassium Urine: 15 mmol/L
Sodium, Ur: 19 mmol/L

## 2022-05-25 LAB — CBC
HCT: 32.7 % — ABNORMAL LOW (ref 39.0–52.0)
Hemoglobin: 10.5 g/dL — ABNORMAL LOW (ref 13.0–17.0)
MCH: 27.6 pg (ref 26.0–34.0)
MCHC: 32.1 g/dL (ref 30.0–36.0)
MCV: 86.1 fL (ref 80.0–100.0)
Platelets: 288 10*3/uL (ref 150–400)
RBC: 3.8 MIL/uL — ABNORMAL LOW (ref 4.22–5.81)
RDW: 12.1 % (ref 11.5–15.5)
WBC: 13.5 10*3/uL — ABNORMAL HIGH (ref 4.0–10.5)
nRBC: 0 % (ref 0.0–0.2)

## 2022-05-25 LAB — URINALYSIS, ROUTINE W REFLEX MICROSCOPIC
Bacteria, UA: NONE SEEN
Bilirubin Urine: NEGATIVE
Glucose, UA: >=500 mg/dL — AB
Hgb urine dipstick: NEGATIVE
Ketones, ur: NEGATIVE mg/dL
Leukocytes,Ua: NEGATIVE
Nitrite: NEGATIVE
Protein, ur: 100 mg/dL — AB
Specific Gravity, Urine: 1.017 (ref 1.005–1.030)
pH: 5 (ref 5.0–8.0)

## 2022-05-25 LAB — LIPASE, BLOOD: Lipase: 54 U/L — ABNORMAL HIGH (ref 11–51)

## 2022-05-25 LAB — CBG MONITORING, ED: Glucose-Capillary: 474 mg/dL — ABNORMAL HIGH (ref 70–99)

## 2022-05-25 MED ORDER — SODIUM CHLORIDE 0.9 % IV BOLUS
1000.0000 mL | Freq: Once | INTRAVENOUS | Status: AC
  Administered 2022-05-25: 1000 mL via INTRAVENOUS

## 2022-05-25 NOTE — ED Triage Notes (Signed)
Presents via EMS from home for upper abd pain, feels like a tight belt and radiates to his back, increased thirst H/o pancreatitis. Hospitalized 1 week ago for similar sx, difficulty with blood sugar and BO in hospital, states he has not used insulin in past 2 days EMS VS 144/90, 84, 96%RA, CBG 528

## 2022-05-26 ENCOUNTER — Emergency Department (HOSPITAL_COMMUNITY): Payer: BLUE CROSS/BLUE SHIELD

## 2022-05-26 ENCOUNTER — Encounter (HOSPITAL_COMMUNITY): Payer: Self-pay

## 2022-05-26 LAB — OSMOLALITY: Osmolality: 305 mOsm/kg — ABNORMAL HIGH (ref 275–295)

## 2022-05-26 LAB — ETHANOL: Alcohol, Ethyl (B): <10 mg/dL (ref <10)

## 2022-05-26 MED ORDER — IOHEXOL 300 MG/ML  SOLN
100.0000 mL | Freq: Once | INTRAMUSCULAR | Status: AC | PRN
  Administered 2022-05-26: 100 mL via INTRAVENOUS

## 2022-05-26 MED ORDER — ONDANSETRON HCL 4 MG PO TABS: 4.0000 mg | ORAL_TABLET | ORAL | 0 refills | Status: DC | PRN

## 2022-05-26 MED ORDER — CHLORPROMAZINE HCL 25 MG PO TABS
25.0000 mg | ORAL_TABLET | Freq: Once | ORAL | Status: AC
  Administered 2022-05-26: 25 mg via ORAL
  Filled 2022-05-26: qty 1

## 2022-05-26 MED ORDER — SODIUM CHLORIDE 1 G PO TABS: 1.0000 g | ORAL_TABLET | Freq: Two times a day (BID) | ORAL | 0 refills | Status: DC

## 2022-05-26 MED ORDER — ONDANSETRON HCL 4 MG/2ML IJ SOLN
4.0000 mg | Freq: Once | INTRAMUSCULAR | Status: AC
  Administered 2022-05-26: 4 mg via INTRAVENOUS
  Filled 2022-05-26: qty 2

## 2022-05-26 MED ORDER — HYDROMORPHONE HCL 1 MG/ML IJ SOLN
1.0000 mg | Freq: Once | INTRAMUSCULAR | Status: AC
  Administered 2022-05-26: 1 mg via INTRAVENOUS
  Filled 2022-05-26: qty 1

## 2022-05-26 MED ORDER — SUCRALFATE 1 G PO TABS: 1.0000 g | ORAL_TABLET | Freq: Three times a day (TID) | ORAL | 0 refills | Status: DC

## 2022-05-26 MED ORDER — ALBUTEROL SULFATE HFA 108 (90 BASE) MCG/ACT IN AERS
1.0000 | INHALATION_SPRAY | Freq: Once | RESPIRATORY_TRACT | Status: AC
  Administered 2022-05-26: 2 via RESPIRATORY_TRACT
  Filled 2022-05-26: qty 6.7

## 2022-05-26 MED ORDER — BASAGLAR KWIKPEN 100 UNIT/ML ~~LOC~~ SOPN: 18.0000 [IU] | PEN_INJECTOR | Freq: Every day | SUBCUTANEOUS | 1 refills | Status: DC

## 2022-05-26 MED ORDER — OXYCODONE HCL 5 MG PO TABS: 5.0000 mg | ORAL_TABLET | ORAL | 0 refills | Status: DC | PRN

## 2022-05-26 MED ORDER — DROPERIDOL 2.5 MG/ML IJ SOLN
2.5000 mg | Freq: Once | INTRAMUSCULAR | Status: AC
  Administered 2022-05-26: 2.5 mg via INTRAMUSCULAR
  Filled 2022-05-26: qty 2

## 2022-05-26 MED ORDER — FAMOTIDINE IN NACL 20-0.9 MG/50ML-% IV SOLN
20.0000 mg | Freq: Once | INTRAVENOUS | Status: AC
  Administered 2022-05-26: 20 mg via INTRAVENOUS
  Filled 2022-05-26: qty 50

## 2022-05-26 NOTE — Discharge Instructions (Addendum)
It was a pleasure caring for you today in the emergency department.  Please return to the emergency department for any worsening or worrisome symptoms.  Your sodium was reduced today, please take salt tablets and see your pcp in the next 3-5 days for recheck if your labs.  Please follow-up with your primary care provider in regards to elevated blood pressure readings today

## 2022-05-26 NOTE — ED Provider Notes (Signed)
[facility] DEPT Provider Note  CSN: [id number] Arrival date & time: 05/25/22 2044  Chief Complaint(s) Abdominal Pain  HPI Randy [name]. is a 42 y.o. male with past medical history as below, significant for HTN, HLD, T2DM/IDDM, pancreatitis, CKD, THC/ETOH use, who presents to the ED with complaint of abd pain/chest pain.  Patient was recently discharged 3 days ago secondary to hypertensive urgency, pancreatitis, COVID-19.  He was feeling somewhat better on discharge when he got home he started feeling bad again.  Having epigastric discomfort rating to his back, nausea with episode of emesis earlier.  Having hiccups which he feels is causing him to have some chest pain.  Checked his blood pressure at home and it was elevated his blood sugar was also elevated, he has not taken his insulin since being discharged from the hospital.  Frequent ED visits/admissions secondary to likely gastroparesis versus cannabis hyperemesis syndrome. Denies etoh use in >30 days.   Accompanied by mother/sister  Past Medical History Past Medical History:  Diagnosis Date   Essential hypertension 05/31/2019   HLD (hyperlipidemia)    Pancreatitis    Type II diabetes mellitus ([place])    Patient Active Problem List   Diagnosis Date Noted   Shortness of breath 05/21/2022   COVID-19 virus infection 05/20/2022   Hypomagnesemia 05/20/2022   COVID-19 05/20/2022   Anemia due to chronic kidney disease 03/28/2022   Stage 3a chronic kidney disease (CKD) ([place]) 03/27/2022   Obesity (BMI 30-39.9) 01/06/2022   Hyponatremia 01/04/2022   Acute on chronic pancreatitis ([place]) 01/04/2022   Abdominal pain 09/12/2021   Fall at home, initial encounter 06/18/2021   Acute pancreatitis 09/17/2020   Abnormal urinalysis 09/17/2020   Intractable nausea and vomiting 09/17/2020   Hypertensive crisis 09/12/2020   AKI (acute kidney injury) ([place]) 09/12/2020   GERD (gastroesophageal reflux disease)  09/12/2020   DM2 (diabetes mellitus, type 2) ([place])    HLD (hyperlipidemia) 06/01/2019   Microalbuminuria due to type 2 diabetes mellitus ([place]) 06/01/2019   Essential hypertension 05/31/2019   Hypertensive urgency 08/31/2015   Type 2 diabetes mellitus with hyperglycemia, with long-term current use of insulin ([place]) 08/31/2015   Cannabis abuse 08/31/2015   Home Medication(s) Prior to Admission medications   Medication Sig Start Date End Date Taking? Authorizing Provider  ondansetron (ZOFRAN) 4 MG tablet Take 1 tablet (4 mg total) by mouth every 4 (four) hours as needed for nausea or vomiting. 05/26/22  Yes Wynona Dove A, DO  oxyCODONE (ROXICODONE) 5 MG immediate release tablet Take 1 tablet (5 mg total) by mouth every 4 (four) hours as needed for severe pain. 05/26/22  Yes Wynona Dove A, DO  sodium chloride 1 g tablet Take 1 tablet (1 g total) by mouth 2 (two) times daily with a meal for 3 days. 05/26/22 05/29/22 Yes Wynona Dove A, DO  sucralfate (CARAFATE) 1 g tablet Take 1 tablet (1 g total) by mouth with breakfast, with lunch, and with evening meal for 7 days. 05/26/22 06/02/22 Yes Jeanell Sparrow, DO  acetaminophen (TYLENOL) 325 MG tablet Take 2 tablets (650 mg total) by mouth every 6 (six) hours as needed for mild pain (or Fever >/= 101). 05/23/22   Raiford Noble Latif, DO  amLODipine (NORVASC) 5 MG tablet Take 2 tablets (10 mg total) by mouth daily. Patient taking differently: Take 5 mg by mouth in the morning and at bedtime. 05/12/22 07/11/22  Charlynne Cousins, MD  ascorbic acid (VITAMIN C) 500 MG tablet Take  1 tablet (500 mg total) by mouth daily. 05/24/22   Raiford Noble Latif, DO  atorvastatin (LIPITOR) 40 MG tablet Take 2 tablets (80 mg total) by mouth daily. 05/12/22 09/09/22  Charlynne Cousins, MD  carvedilol (COREG) 25 MG tablet Take 1 tablet (25 mg total) by mouth 2 (two) times daily with a meal. 05/12/22 09/09/22  Charlynne Cousins, MD  dexamethasone (DECADRON) 6 MG tablet Take 1  tablet (6 mg total) by mouth daily for 6 days. 05/24/22 05/30/22  Raiford Noble Latif, DO  folic acid (FOLVITE) 1 MG tablet Take 1 tablet (1 mg total) by mouth daily. 05/24/22   Raiford Noble Latif, DO  glucose blood (ACCU-CHEK GUIDE) test strip Use to check blood sugar 3 times daily. 10/15/21   Charlott Rakes, MD  guaiFENesin-dextromethorphan (ROBITUSSIN DM) 100-10 MG/5ML syrup Take 10 mLs by mouth every 4 (four) hours as needed for cough. 05/23/22   Raiford Noble Latif, DO  hydrALAZINE (APRESOLINE) 100 MG tablet Take 1 tablet (100 mg total) by mouth every 8 (eight) hours. 05/12/22 07/11/22  Charlynne Cousins, MD  Insulin Glargine [facility]) 100 UNIT/ML Inject 18 Units into the skin daily. 05/26/22 07/25/22  Wynona Dove A, DO  Insulin Pen Needle (TRUEPLUS 5-BEVEL PEN NEEDLES) 32G X 4 MM MISC Use to inject basaglar once daily. 01/30/22   Camillia Herter, NP  Ipratropium-Albuterol (COMBIVENT) 20-100 MCG/ACT AERS respimat Inhale 1 puff into the lungs every 6 (six) hours as needed for wheezing or shortness of breath. 05/23/22   Raiford Noble Latif, DO  Multiple Vitamin (MULTIVITAMIN WITH MINERALS) TABS tablet Take 1 tablet by mouth daily. 05/24/22   Sheikh, Georgina Quint Latif, DO  ondansetron (ZOFRAN-ODT) 4 MG disintegrating tablet Take 1 tablet (4 mg total) by mouth every 8 (eight) hours as needed for nausea or vomiting. 03/27/22   Quintella Reichert, MD  pantoprazole (PROTONIX) 40 MG tablet Take 1 tablet (40 mg total) by mouth 2 (two) times daily. Patient taking differently: Take 40 mg by mouth daily before breakfast. 05/12/22 05/12/23  Charlynne Cousins, MD  thiamine (VITAMIN B-1) 100 MG tablet Take 1 tablet (100 mg total) by mouth daily. 05/24/22   Raiford Noble Latif, DO  traMADol (ULTRAM) 50 MG tablet Take 1 tablet (50 mg total) by mouth every 6 (six) hours as needed. 05/23/22   Raiford Noble Latif, DO  zinc sulfate 220 (50 Zn) MG capsule Take 1 capsule (220 mg total) by mouth daily. 05/24/22   Kerney Elbe, DO                                                                                                                                    Past Surgical History Past Surgical History:  Procedure Laterality Date   NO PAST SURGERIES     Family History Family History  Problem Relation Age of Onset   Hypertension Mother    Diabetes Father  Hypertension Father    Diabetes Brother    Diabetes Maternal Grandmother    Colon cancer Neg Hx    Esophageal cancer Neg Hx    Rectal cancer Neg Hx    Stomach cancer Neg Hx     Social History Social History   Tobacco Use   Smoking status: Former    Packs/day: 0.33    Years: 17.00    Total pack years: 5.61    Types: Cigarettes    Quit date: 12/26/2011    Years since quitting: 10.4    Passive exposure: Past   Smokeless tobacco: Never  Vaping Use   Vaping Use: Never used  Substance Use Topics   Alcohol use: No    Comment: no alcohol since age 52   Drug use: Yes    Types: Marijuana    Comment: regular use   Allergies Tomato  Review of Systems Review of Systems  Constitutional:  Negative for chills and fever.  HENT:  Negative for facial swelling and trouble swallowing.   Eyes:  Negative for photophobia and visual disturbance.  Respiratory:  Negative for cough and shortness of breath.   Cardiovascular:  Positive for chest pain. Negative for palpitations.  Gastrointestinal:  Positive for abdominal pain and nausea. Negative for vomiting.  Endocrine: Positive for polydipsia. Negative for polyuria.  Genitourinary:  Negative for difficulty urinating and hematuria.  Musculoskeletal:  Negative for gait problem and joint swelling.  Skin:  Negative for pallor and rash.  Neurological:  Negative for syncope and headaches.  Psychiatric/Behavioral:  Negative for agitation and confusion.     Physical Exam Vital Signs  I have reviewed the triage vital signs BP (!) 175/97   Pulse 69   Temp 97.8 F (36.6 C) (Oral)   Resp 15   Ht 5\' 8"   (1.727 m)   Wt 85.3 kg   SpO2 99%   BMI 28.59 kg/m  Physical Exam Vitals and nursing note reviewed.  Constitutional:      General: He is not in acute distress.    Appearance: He is well-developed. He is not diaphoretic.  HENT:     Head: Normocephalic and atraumatic.     Right Ear: External ear normal.     Left Ear: External ear normal.     Mouth/Throat:     Mouth: Mucous membranes are moist.  Eyes:     General: No scleral icterus. Cardiovascular:     Rate and Rhythm: Normal rate and regular rhythm.     Pulses: Normal pulses.     Heart sounds: Normal heart sounds.  Pulmonary:     Effort: Pulmonary effort is normal. No respiratory distress.     Breath sounds: Normal breath sounds.  Abdominal:     General: Abdomen is flat.     Palpations: Abdomen is soft.     Tenderness: There is abdominal tenderness in the epigastric area. There is no guarding or rebound.  Musculoskeletal:        General: Normal range of motion.     Cervical back: Normal range of motion.     Right lower leg: No edema.     Left lower leg: No edema.  Skin:    General: Skin is warm and dry.     Capillary Refill: Capillary refill takes less than 2 seconds.  Neurological:     Mental Status: He is alert and oriented to person, place, and time.  Psychiatric:        Mood and Affect: Mood normal.  Behavior: Behavior normal.     ED Results and Treatments Labs (all labs ordered are listed, but only abnormal results are displayed) Labs Reviewed  LIPASE, BLOOD - Abnormal; Notable for the following components:      Result Value   Lipase 54 (*)    All other components within normal limits  COMPREHENSIVE METABOLIC PANEL - Abnormal; Notable for the following components:   Sodium 127 (*)    Chloride 97 (*)    CO2 20 (*)    Glucose, Bld 489 (*)    BUN 34 (*)    Creatinine, Ser 1.48 (*)    Calcium 7.9 (*)    Total Protein 6.4 (*)    Albumin 3.1 (*)    All other components within normal limits  CBC -  Abnormal; Notable for the following components:   WBC 13.5 (*)    RBC 3.80 (*)    Hemoglobin 10.5 (*)    HCT 32.7 (*)    All other components within normal limits  URINALYSIS, ROUTINE W REFLEX MICROSCOPIC - Abnormal; Notable for the following components:   Color, Urine STRAW (*)    Glucose, UA >=500 (*)    Protein, ur 100 (*)    All other components within normal limits  CBG MONITORING, ED - Abnormal; Notable for the following components:   Glucose-Capillary 474 (*)    All other components within normal limits  ETHANOL  NA AND K (SODIUM & POTASSIUM), RAND UR  OSMOLALITY                                                                                                                          Radiology CT ABDOMEN PELVIS W CONTRAST  Result Date: 05/26/2022 CLINICAL DATA:  Epigastric pain with history of pancreatitis. Hospitalized 1 week ago for similar complaints. EXAM: CT ABDOMEN AND PELVIS WITH CONTRAST TECHNIQUE: Multidetector CT imaging of the abdomen and pelvis was performed using the standard protocol following bolus administration of intravenous contrast. RADIATION DOSE REDUCTION: This exam was performed according to the departmental dose-optimization program which includes automated exposure control, adjustment of the mA and/or kV according to patient size and/or use of iterative reconstruction technique. CONTRAST:  168mL OMNIPAQUE IOHEXOL 300 MG/ML  SOLN COMPARISON:  Multiple prior CTs dating back to 2015. The 3 most recent are with contrast, dated 05/20/2022, 05/10/2022 and 03/27/2022 FINDINGS: Lower chest: No acute abnormality. Hepatobiliary: No focal liver abnormality is seen. No calcified gallstones, gallbladder wall thickening, or biliary dilatation. Pancreas: No inflammatory changes, mass enhancement or ductal dilatation. Spleen: No abnormality. Adrenals/Urinary Tract: There is no adrenal mass. There is increased bilateral faint perinephric stranding, and could relate to fluid  overload or inflammatory process. There are no areas of hypoenhancement in the kidneys typically seen with pyelonephritis. There is no urinary stone or obstruction. There is no bladder thickening. Stomach/Bowel: The stomach is contracted. No focal abnormality is seen. The unopacified small bowel is normal caliber. The infracecal appendix is normal. There is  moderate retained stool. There is diverticulosis without evidence of diverticulitis. Vascular/Lymphatic: Very minimal aortic atherosclerosis. Scattered calcific plaque in the common iliac and internal iliac arteries. No AAA. No adenopathy. Reproductive: Prostate is unremarkable. Other: There is no free air, free hemorrhage, free fluid or incarcerated hernia. Musculoskeletal: No acute or significant osseous findings. IMPRESSION: 1. Increased faint perinephric stranding, which could be due to fluid overload or inflammatory process. There are no areas of hypoenhancement in the kidneys typically seen with pyelonephritis. There is no bladder thickening either. No urinary stones. 2. Constipation and diverticulosis. 3. Aortoiliac atherosclerosis. Electronically Signed   By: Almira BarKeith  Chesser M.D.   On: 05/26/2022 01:33   DG Chest Portable 1 View  Result Date: 05/26/2022 CLINICAL DATA:  Chest and abdomen pain. EXAM: PORTABLE CHEST 1 VIEW COMPARISON:  Portable chest 05/23/2022, portable chest 05/21/2022. FINDINGS: The heart size and mediastinal contours are within normal limits. Both lungs are clear. The visualized skeletal structures are unremarkable. There have been no significant interval changes. IMPRESSION: No active disease.  Stable chest. Electronically Signed   By: Almira BarKeith  Chesser M.D.   On: 05/26/2022 01:17    Pertinent labs & imaging results that were available during my care of the patient were reviewed by me and considered in my medical decision making (see MDM for details).  Medications Ordered in ED Medications  sodium chloride 0.9 % bolus 1,000 mL (0  mLs Intravenous Stopped 05/26/22 0043)  ondansetron (ZOFRAN) injection 4 mg (4 mg Intravenous Given 05/26/22 0041)  HYDROmorphone (DILAUDID) injection 1 mg (1 mg Intravenous Given 05/26/22 0042)  famotidine (PEPCID) IVPB 20 mg premix (0 mg Intravenous Stopped 05/26/22 0135)  chlorproMAZINE (THORAZINE) tablet 25 mg (25 mg Oral Given 05/26/22 0134)  iohexol (OMNIPAQUE) 300 MG/ML solution 100 mL (100 mLs Intravenous Contrast Given 05/26/22 0058)  droperidol (INAPSINE) 2.5 MG/ML injection 2.5 mg (2.5 mg Intramuscular Given 05/26/22 0318)  albuterol (VENTOLIN HFA) 108 (90 Base) MCG/ACT inhaler 1-2 puff (2 puffs Inhalation Provided for home use 05/26/22 0351)                                                                                                                                     Procedures .Critical Care  Performed by: Sloan LeiterGray, Safi Culotta A, DO Authorized by: Sloan LeiterGray, Lorrayne Ismael A, DO   Critical care provider statement:    Critical care time (minutes):  30   Critical care time was exclusive of:  Separately billable procedures and treating other patients   Critical care was necessary to treat or prevent imminent or life-threatening deterioration of the following conditions: Abdominal pain required multiple interventions analgesics intravenously.   Critical care was time spent personally by me on the following activities:  Development of treatment plan with patient or surrogate, discussions with consultants, evaluation of patient's response to treatment, examination of patient, ordering and review of laboratory studies, ordering and review of radiographic studies, ordering and performing treatments and  interventions, pulse oximetry, re-evaluation of patient's condition, review of old charts and obtaining history from patient or surrogate   (including critical care time)  Medical Decision Making / ED Course   MDM:  Sonda Rumble. is a 42 y.o. male with past medical history as below, significant for HTN,  HLD, T2DM/IDDM, pancreatitis, CKD, THC/ETOH use, who presents to the ED with complaint of abd pain/chest pain.. The complaint involves an extensive differential diagnosis and also carries with it a high risk of complications and morbidity.  Serious etiology was considered. Ddx includes but is not limited to: Differential diagnosis includes but is not exclusive to acute cholecystitis, intrathoracic causes for epigastric abdominal pain, gastritis, duodenitis, pancreatitis, small bowel or large bowel obstruction, abdominal aortic aneurysm, hernia, gastritis, etc.  Differential includes all life-threatening causes for chest pain. This includes but is not exclusive to acute coronary syndrome, aortic dissection, pulmonary embolism, cardiac tamponade, community-acquired pneumonia, pericarditis, musculoskeletal chest wall pain, etc.   On initial assessment the patient is: resting comfortably, abdomen is not surgical, HDS, on ambient air. Screening labs for acs/ ct a/p Vital signs and nursing notes were reviewed    Labs reviewed, he has mild leukocytosis, favor reactive in setting of vomiting.  He is afebrile.  He is not septic.  Mild hyponatremia, likely secondary to poor p.o., vomiting.  Given IV fluids in the ER.  Lipase is mildly elevated, concern for chronic pancreatitis.  No evidence of acute pancreatitis on imaging today.  Patient was given analgesics with mild improvement, was given droperidol with significant premature symptoms.  Patient also with singultus, resolved with Thorazine.  Urinalysis unremarkable.  Alcohol level is negative.  Glucose was elevated, no DKA.  He is recently placed on steroids, was not using his insulin secondary to insurance issues.  He now has insurance coverage and will start using his insulin again.  Reordered insulin KwikPen  He is feeling much better.  Abdominal exam is soft, nontender, peritoneal.  He is tolerating intake without difficulty.  Recommend discharge home  with close outpatient follow-up PCP.  The patient improved significantly and was discharged in stable condition. Detailed discussions were had with the patient regarding current findings, and need for close f/u with PCP or on call doctor. The patient has been instructed to return immediately if the symptoms worsen in any way for re-evaluation. Patient verbalized understanding and is in agreement with current care plan. All questions answered prior to discharge.    Additional history obtained: -Additional history obtained from family -External records from outside source obtained and reviewed including: Chart review including previous notes, labs, imaging, consultation notes including recent admissions, prior ED visits, prior labs and imaging, home medications   Lab Tests: -I ordered, reviewed, and interpreted labs.   The pertinent results include:   Labs Reviewed  LIPASE, BLOOD - Abnormal; Notable for the following components:      Result Value   Lipase 54 (*)    All other components within normal limits  COMPREHENSIVE METABOLIC PANEL - Abnormal; Notable for the following components:   Sodium 127 (*)    Chloride 97 (*)    CO2 20 (*)    Glucose, Bld 489 (*)    BUN 34 (*)    Creatinine, Ser 1.48 (*)    Calcium 7.9 (*)    Total Protein 6.4 (*)    Albumin 3.1 (*)    All other components within normal limits  CBC - Abnormal; Notable for the following components:  WBC 13.5 (*)    RBC 3.80 (*)    Hemoglobin 10.5 (*)    HCT 32.7 (*)    All other components within normal limits  URINALYSIS, ROUTINE W REFLEX MICROSCOPIC - Abnormal; Notable for the following components:   Color, Urine STRAW (*)    Glucose, UA >=500 (*)    Protein, ur 100 (*)    All other components within normal limits  CBG MONITORING, ED - Abnormal; Notable for the following components:   Glucose-Capillary 474 (*)    All other components within normal limits  ETHANOL  NA AND K (SODIUM & POTASSIUM), RAND UR   OSMOLALITY    Notable for as above  EKG   EKG Interpretation  Date/Time:  Sunday May 26 2022 02:33:45 EST Ventricular Rate:  62 PR Interval:  160 QRS Duration: 98 QT Interval:  430 QTC Calculation: 437 R Axis:   53 Text Interpretation: Sinus rhythm ST elev, probable normal early repol pattern similar to prior no stemi Early repolarization Confirmed by Tanda Rockers ([id number]) on 05/26/2022 5:04:59 AM         Imaging Studies ordered: I ordered imaging studies including chest x-ray, CT AP I independently visualized the following imaging with scope of interpretation limited to determining acute life threatening conditions related to emergency care: Question stranding around kidneys, no evidence of infection on urinalysis.  Pyelonephritis is unlikely. I independently visualized and interpreted imaging. I agree with the radiologist interpretation   Medicines ordered and prescription drug management: Meds ordered this encounter  Medications   sodium chloride 0.9 % bolus 1,000 mL   ondansetron (ZOFRAN) injection 4 mg   HYDROmorphone (DILAUDID) injection 1 mg   famotidine (PEPCID) IVPB 20 mg premix   chlorproMAZINE (THORAZINE) tablet 25 mg   iohexol (OMNIPAQUE) 300 MG/ML solution 100 mL   droperidol (INAPSINE) 2.5 MG/ML injection 2.5 mg   albuterol (VENTOLIN HFA) 108 (90 Base) MCG/ACT inhaler 1-2 puff   oxyCODONE (ROXICODONE) 5 MG immediate release tablet    Sig: Take 1 tablet (5 mg total) by mouth every 4 (four) hours as needed for severe pain.    Dispense:  10 tablet    Refill:  0   ondansetron (ZOFRAN) 4 MG tablet    Sig: Take 1 tablet (4 mg total) by mouth every 4 (four) hours as needed for nausea or vomiting.    Dispense:  12 tablet    Refill:  0   sodium chloride 1 g tablet    Sig: Take 1 tablet (1 g total) by mouth 2 (two) times daily with a meal for 3 days.    Dispense:  6 tablet    Refill:  0   sucralfate (CARAFATE) 1 g tablet    Sig: Take 1 tablet (1 g total) by  mouth with breakfast, with lunch, and with evening meal for 7 days.    Dispense:  21 tablet    Refill:  0   Insulin Glargine (BASAGLAR KWIKPEN) 100 UNIT/ML    Sig: Inject 18 Units into the skin daily.    Dispense:  15 mL    Refill:  1    -I have reviewed the patients home medicines and have made adjustments as needed   Consultations Obtained: Not applicable  Cardiac Monitoring: The patient was maintained on a cardiac monitor.  I personally viewed and interpreted the cardiac monitored which showed an underlying rhythm of: NSR  Social Determinants of Health:  Diagnosis or treatment significantly limited by social determinants of health:  former smoker THC abuse, prior alcohol abuse   Reevaluation: After the interventions noted above, I reevaluated the patient and found that they have resolved  Co morbidities that complicate the patient evaluation  Past Medical History:  Diagnosis Date   Essential hypertension 05/31/2019   HLD (hyperlipidemia)    Pancreatitis    Type II diabetes mellitus ([place])       Dispostion: Disposition decision including need for hospitalization was considered, and patient discharged from emergency department.    Final Clinical Impression(s) / ED Diagnoses Final diagnoses:  Chronic abdominal pain  Hyponatremia  Nausea and vomiting, unspecified vomiting type  Poorly-controlled hypertension     This chart was dictated using voice recognition software.  Despite best efforts to proofread,  errors can occur which can change the documentation meaning.    Jeanell Sparrow, DO 05/26/22 [phone]

## 2022-05-27 ENCOUNTER — Encounter (HOSPITAL_COMMUNITY): Payer: Self-pay | Admitting: Emergency Medicine

## 2022-05-27 ENCOUNTER — Other Ambulatory Visit: Payer: Self-pay

## 2022-05-27 ENCOUNTER — Observation Stay (HOSPITAL_COMMUNITY)
Admission: EM | Admit: 2022-05-27 | Discharge: 2022-05-31 | Disposition: A | Discharge status: Home / Self Care | Payer: BLUE CROSS/BLUE SHIELD | Attending: Internal Medicine | Admitting: Internal Medicine

## 2022-05-27 DIAGNOSIS — Z8616 Personal history of COVID-19: Secondary | ICD-10-CM | POA: Insufficient documentation

## 2022-05-27 DIAGNOSIS — I129 Hypertensive chronic kidney disease with stage 1 through stage 4 chronic kidney disease, or unspecified chronic kidney disease: Secondary | ICD-10-CM | POA: Insufficient documentation

## 2022-05-27 DIAGNOSIS — E871 Hypo-osmolality and hyponatremia: Secondary | ICD-10-CM | POA: Diagnosis not present

## 2022-05-27 DIAGNOSIS — Z79899 Other long term (current) drug therapy: Secondary | ICD-10-CM | POA: Diagnosis not present

## 2022-05-27 DIAGNOSIS — E1122 Type 2 diabetes mellitus with diabetic chronic kidney disease: Secondary | ICD-10-CM | POA: Diagnosis not present

## 2022-05-27 DIAGNOSIS — N1831 Chronic kidney disease, stage 3a: Secondary | ICD-10-CM | POA: Diagnosis present

## 2022-05-27 DIAGNOSIS — U071 COVID-19: Secondary | ICD-10-CM | POA: Diagnosis present

## 2022-05-27 DIAGNOSIS — Z87891 Personal history of nicotine dependence: Secondary | ICD-10-CM | POA: Diagnosis not present

## 2022-05-27 DIAGNOSIS — E1165 Type 2 diabetes mellitus with hyperglycemia: Principal | ICD-10-CM | POA: Diagnosis present

## 2022-05-27 DIAGNOSIS — I10 Essential (primary) hypertension: Secondary | ICD-10-CM | POA: Diagnosis present

## 2022-05-27 DIAGNOSIS — D72829 Elevated white blood cell count, unspecified: Secondary | ICD-10-CM | POA: Diagnosis present

## 2022-05-27 DIAGNOSIS — R739 Hyperglycemia, unspecified: Secondary | ICD-10-CM

## 2022-05-27 DIAGNOSIS — E785 Hyperlipidemia, unspecified: Secondary | ICD-10-CM | POA: Diagnosis present

## 2022-05-27 DIAGNOSIS — Z794 Long term (current) use of insulin: Secondary | ICD-10-CM | POA: Insufficient documentation

## 2022-05-27 DIAGNOSIS — K859 Acute pancreatitis without necrosis or infection, unspecified: Secondary | ICD-10-CM | POA: Diagnosis present

## 2022-05-27 DIAGNOSIS — D631 Anemia in chronic kidney disease: Secondary | ICD-10-CM | POA: Diagnosis present

## 2022-05-27 LAB — COMPREHENSIVE METABOLIC PANEL
ALT: 25 U/L (ref 0–44)
AST: 23 U/L (ref 15–41)
Albumin: 3 g/dL — ABNORMAL LOW (ref 3.5–5.0)
Alkaline Phosphatase: 86 U/L (ref 38–126)
Anion gap: 9 (ref 5–15)
BUN: 38 mg/dL — ABNORMAL HIGH (ref 6–20)
CO2: 19 mmol/L — ABNORMAL LOW (ref 22–32)
Calcium: 8 mg/dL — ABNORMAL LOW (ref 8.9–10.3)
Chloride: 95 mmol/L — ABNORMAL LOW (ref 98–111)
Creatinine, Ser: 1.64 mg/dL — ABNORMAL HIGH (ref 0.61–1.24)
GFR, Estimated: 53 mL/min — ABNORMAL LOW (ref >60)
Glucose, Bld: 626 mg/dL (ref 70–99)
Potassium: 4.8 mmol/L (ref 3.5–5.1)
Sodium: 123 mmol/L — ABNORMAL LOW (ref 135–145)
Total Bilirubin: 0.4 mg/dL (ref 0.3–1.2)
Total Protein: 6.4 g/dL — ABNORMAL LOW (ref 6.5–8.1)

## 2022-05-27 LAB — MAGNESIUM: Magnesium: 2.1 mg/dL (ref 1.7–2.4)

## 2022-05-27 LAB — CBC WITH DIFFERENTIAL/PLATELET
Abs Immature Granulocytes: 0.24 10*3/uL — ABNORMAL HIGH (ref 0.00–0.07)
Basophils Absolute: 0 10*3/uL (ref 0.0–0.1)
Basophils Relative: 0 %
Eosinophils Absolute: 0 10*3/uL (ref 0.0–0.5)
Eosinophils Relative: 0 %
HCT: 31.5 % — ABNORMAL LOW (ref 39.0–52.0)
Hemoglobin: 10.5 g/dL — ABNORMAL LOW (ref 13.0–17.0)
Immature Granulocytes: 2 %
Lymphocytes Relative: 8 %
Lymphs Abs: 1.1 10*3/uL (ref 0.7–4.0)
MCH: 27.7 pg (ref 26.0–34.0)
MCHC: 33.3 g/dL (ref 30.0–36.0)
MCV: 83.1 fL (ref 80.0–100.0)
Monocytes Absolute: 0.4 10*3/uL (ref 0.1–1.0)
Monocytes Relative: 3 %
Neutro Abs: 11.8 10*3/uL — ABNORMAL HIGH (ref 1.7–7.7)
Neutrophils Relative %: 87 %
Platelets: 283 10*3/uL (ref 150–400)
RBC: 3.79 MIL/uL — ABNORMAL LOW (ref 4.22–5.81)
RDW: 11.9 % (ref 11.5–15.5)
WBC: 13.6 10*3/uL — ABNORMAL HIGH (ref 4.0–10.5)
nRBC: 0 % (ref 0.0–0.2)

## 2022-05-27 LAB — BLOOD GAS, VENOUS
Acid-base deficit: 1.7 mmol/L (ref 0.0–2.0)
Bicarbonate: 23 mmol/L (ref 20.0–28.0)
O2 Saturation: 96.3 %
Patient temperature: 37.1
pCO2, Ven: 38 mmHg — ABNORMAL LOW (ref 44–60)
pH, Ven: 7.39 (ref 7.25–7.43)
pO2, Ven: 64 mmHg — ABNORMAL HIGH (ref 32–45)

## 2022-05-27 LAB — BETA-HYDROXYBUTYRIC ACID: Beta-Hydroxybutyric Acid: 0.15 mmol/L (ref 0.05–0.27)

## 2022-05-27 LAB — CBG MONITORING, ED: Glucose-Capillary: 593 mg/dL (ref 70–99)

## 2022-05-27 MED ORDER — LACTATED RINGERS IV BOLUS
2000.0000 mL | Freq: Once | INTRAVENOUS | Status: AC
  Administered 2022-05-27: 2000 mL via INTRAVENOUS

## 2022-05-27 NOTE — ED Provider Notes (Signed)
[facility] DEPT Provider Note   CSN: [id number] Arrival date & time: 05/27/22  2151     History {Add pertinent medical, surgical, social history, OB history to HPI:1} Chief Complaint  Patient presents with   Hyperglycemia    Randy [name]. is a 42 y.o. male.   Hyperglycemia Associated symptoms: abdominal pain, fatigue and polyuria   Patient presents for elevated blood sugar.  Medical history includes T2DM, CKD, HTN, HLD, GERD, pancreatitis, anemia, cannabis use.  He was hospitalized last week for intractable nausea and vomiting.  Suspected etiologies are gastroparesis and/or cannabinoid hyperemesis syndrome.  He was also treated for hypertensive urgency.  He was discharged 4 days ago.  He came to the ED 2 nights ago for abdominal and chest pain.  His blood sugar was elevated at that time.  He was given IV fluids, Dilaudid, Zofran, Pepcid, droperidol.  His hiccups are treated with Thorazine.  He had improved symptoms and was discharged.  Today, he has had ongoing lower abdominal pain which is consistent with pain he was experiencing during his recent hospitalization.  He has not had any nausea.  He has had decreased appetite but was able to eat this evening.  He checked his sugar and it was in the range of 600.  At around 9:30 PM, he got 30 units of short acting insulin.  He did not take any other insulin today.  He states that he typically takes 35 units with meals.  He does not use long-acting insulin.  His last bowel movement was yesterday.  He has had recent polyuria.     Home Medications Prior to Admission medications   Medication Sig Start Date End Date Taking? Authorizing Provider  acetaminophen (TYLENOL) 325 MG tablet Take 2 tablets (650 mg total) by mouth every 6 (six) hours as needed for mild pain (or Fever >/= 101). 05/23/22   Raiford Noble Latif, DO  amLODipine (NORVASC) 5 MG tablet Take 2 tablets (10 mg total) by mouth daily. Patient taking  differently: Take 5 mg by mouth in the morning and at bedtime. 05/12/22 07/11/22  Charlynne Cousins, MD  ascorbic acid (VITAMIN C) 500 MG tablet Take 1 tablet (500 mg total) by mouth daily. 05/24/22   Raiford Noble Latif, DO  atorvastatin (LIPITOR) 40 MG tablet Take 2 tablets (80 mg total) by mouth daily. 05/12/22 09/09/22  Charlynne Cousins, MD  carvedilol (COREG) 25 MG tablet Take 1 tablet (25 mg total) by mouth 2 (two) times daily with a meal. 05/12/22 09/09/22  Charlynne Cousins, MD  dexamethasone (DECADRON) 6 MG tablet Take 1 tablet (6 mg total) by mouth daily for 6 days. 05/24/22 05/30/22  Raiford Noble Latif, DO  folic acid (FOLVITE) 1 MG tablet Take 1 tablet (1 mg total) by mouth daily. 05/24/22   Raiford Noble Latif, DO  glucose blood (ACCU-CHEK GUIDE) test strip Use to check blood sugar 3 times daily. 10/15/21   Charlott Rakes, MD  guaiFENesin-dextromethorphan (ROBITUSSIN DM) 100-10 MG/5ML syrup Take 10 mLs by mouth every 4 (four) hours as needed for cough. 05/23/22   Raiford Noble Latif, DO  hydrALAZINE (APRESOLINE) 100 MG tablet Take 1 tablet (100 mg total) by mouth every 8 (eight) hours. 05/12/22 07/11/22  Charlynne Cousins, MD  Insulin Glargine [facility]) 100 UNIT/ML Inject 18 Units into the skin daily. 05/26/22 07/25/22  Wynona Dove A, DO  Insulin Pen Needle (TRUEPLUS 5-BEVEL PEN NEEDLES) 32G X 4 MM MISC Use to inject basaglar  once daily. 01/30/22   Rema Fendt, NP  Ipratropium-Albuterol (COMBIVENT) 20-100 MCG/ACT AERS respimat Inhale 1 puff into the lungs every 6 (six) hours as needed for wheezing or shortness of breath. 05/23/22   Marguerita Merles Latif, DO  Multiple Vitamin (MULTIVITAMIN WITH MINERALS) TABS tablet Take 1 tablet by mouth daily. 05/24/22   Sheikh, Omair Latif, DO  ondansetron (ZOFRAN) 4 MG tablet Take 1 tablet (4 mg total) by mouth every 4 (four) hours as needed for nausea or vomiting. 05/26/22   Sloan Leiter, DO  ondansetron (ZOFRAN-ODT) 4 MG disintegrating  tablet Take 1 tablet (4 mg total) by mouth every 8 (eight) hours as needed for nausea or vomiting. 03/27/22   Tilden Fossa, MD  oxyCODONE (ROXICODONE) 5 MG immediate release tablet Take 1 tablet (5 mg total) by mouth every 4 (four) hours as needed for severe pain. 05/26/22   Sloan Leiter, DO  pantoprazole (PROTONIX) 40 MG tablet Take 1 tablet (40 mg total) by mouth 2 (two) times daily. Patient taking differently: Take 40 mg by mouth daily before breakfast. 05/12/22 05/12/23  Marinda Elk, MD  sodium chloride 1 g tablet Take 1 tablet (1 g total) by mouth 2 (two) times daily with a meal for 3 days. 05/26/22 05/29/22  Sloan Leiter, DO  sucralfate (CARAFATE) 1 g tablet Take 1 tablet (1 g total) by mouth with breakfast, with lunch, and with evening meal for 7 days. 05/26/22 06/02/22  Tanda Rockers A, DO  thiamine (VITAMIN B-1) 100 MG tablet Take 1 tablet (100 mg total) by mouth daily. 05/24/22   Marguerita Merles Latif, DO  traMADol (ULTRAM) 50 MG tablet Take 1 tablet (50 mg total) by mouth every 6 (six) hours as needed. 05/23/22   Marguerita Merles Latif, DO  zinc sulfate 220 (50 Zn) MG capsule Take 1 capsule (220 mg total) by mouth daily. 05/24/22   Marguerita Merles Latif, DO      Allergies    Tomato    Review of Systems   Review of Systems  Constitutional:  Positive for fatigue.  Gastrointestinal:  Positive for abdominal pain.  Endocrine: Positive for polyuria.  All other systems reviewed and are negative.   Physical Exam Updated Vital Signs Ht 5\' 8"  (1.727 m)   Wt 85 kg   BMI 28.49 kg/m  Physical Exam Vitals and nursing note reviewed.  Constitutional:      General: He is not in acute distress.    Appearance: Normal appearance. He is well-developed. He is not ill-appearing, toxic-appearing or diaphoretic.  HENT:     Head: Normocephalic and atraumatic.     Right Ear: External ear normal.     Left Ear: External ear normal.     Nose: Nose normal.     Mouth/Throat:     Mouth: Mucous  membranes are moist.  Eyes:     Conjunctiva/sclera: Conjunctivae normal.  Cardiovascular:     Rate and Rhythm: Normal rate and regular rhythm.  Pulmonary:     Effort: Pulmonary effort is normal. No respiratory distress.  Abdominal:     General: There is no distension.     Palpations: Abdomen is soft.     Tenderness: There is no abdominal tenderness.  Musculoskeletal:        General: No swelling. Normal range of motion.     Cervical back: Normal range of motion and neck supple.     Right lower leg: No edema.     Left lower leg: No edema.  Skin:    General: Skin is warm and dry.     Capillary Refill: Capillary refill takes less than 2 seconds.     Coloration: Skin is not jaundiced or pale.  Neurological:     General: No focal deficit present.     Mental Status: He is alert and oriented to person, place, and time.     Cranial Nerves: No cranial nerve deficit.     Sensory: No sensory deficit.     Motor: No weakness.     Coordination: Coordination normal.  Psychiatric:        Mood and Affect: Mood normal.        Behavior: Behavior normal.        Thought Content: Thought content normal.        Judgment: Judgment normal.     ED Results / Procedures / Treatments   Labs (all labs ordered are listed, but only abnormal results are displayed) Labs Reviewed  CBG MONITORING, ED    EKG None  Radiology CT ABDOMEN PELVIS W CONTRAST  Result Date: 05/26/2022 CLINICAL DATA:  Epigastric pain with history of pancreatitis. Hospitalized 1 week ago for similar complaints. EXAM: CT ABDOMEN AND PELVIS WITH CONTRAST TECHNIQUE: Multidetector CT imaging of the abdomen and pelvis was performed using the standard protocol following bolus administration of intravenous contrast. RADIATION DOSE REDUCTION: This exam was performed according to the departmental dose-optimization program which includes automated exposure control, adjustment of the mA and/or kV according to patient size and/or use of  iterative reconstruction technique. CONTRAST:  111mL OMNIPAQUE IOHEXOL 300 MG/ML  SOLN COMPARISON:  Multiple prior CTs dating back to 2015. The 3 most recent are with contrast, dated 05/20/2022, 05/10/2022 and 03/27/2022 FINDINGS: Lower chest: No acute abnormality. Hepatobiliary: No focal liver abnormality is seen. No calcified gallstones, gallbladder wall thickening, or biliary dilatation. Pancreas: No inflammatory changes, mass enhancement or ductal dilatation. Spleen: No abnormality. Adrenals/Urinary Tract: There is no adrenal mass. There is increased bilateral faint perinephric stranding, and could relate to fluid overload or inflammatory process. There are no areas of hypoenhancement in the kidneys typically seen with pyelonephritis. There is no urinary stone or obstruction. There is no bladder thickening. Stomach/Bowel: The stomach is contracted. No focal abnormality is seen. The unopacified small bowel is normal caliber. The infracecal appendix is normal. There is moderate retained stool. There is diverticulosis without evidence of diverticulitis. Vascular/Lymphatic: Very minimal aortic atherosclerosis. Scattered calcific plaque in the common iliac and internal iliac arteries. No AAA. No adenopathy. Reproductive: Prostate is unremarkable. Other: There is no free air, free hemorrhage, free fluid or incarcerated hernia. Musculoskeletal: No acute or significant osseous findings. IMPRESSION: 1. Increased faint perinephric stranding, which could be due to fluid overload or inflammatory process. There are no areas of hypoenhancement in the kidneys typically seen with pyelonephritis. There is no bladder thickening either. No urinary stones. 2. Constipation and diverticulosis. 3. Aortoiliac atherosclerosis. Electronically Signed   By: Telford Nab M.D.   On: 05/26/2022 01:33   DG Chest Portable 1 View  Result Date: 05/26/2022 CLINICAL DATA:  Chest and abdomen pain. EXAM: PORTABLE CHEST 1 VIEW COMPARISON:   Portable chest 05/23/2022, portable chest 05/21/2022. FINDINGS: The heart size and mediastinal contours are within normal limits. Both lungs are clear. The visualized skeletal structures are unremarkable. There have been no significant interval changes. IMPRESSION: No active disease.  Stable chest. Electronically Signed   By: Telford Nab M.D.   On: 05/26/2022 01:17    Procedures Procedures  {  Document cardiac monitor, telemetry assessment procedure when appropriate:1}  Medications Ordered in ED Medications - No data to display  ED Course/ Medical Decision Making/ A&P   {   Click here for ABCD2, HEART and other calculatorsREFRESH Note before signing :1}                          Medical Decision Making Amount and/or Complexity of Data Reviewed Labs: ordered.   This patient presents to the ED for concern of ***, this involves an extensive number of treatment options, and is a complaint that carries with it a high risk of complications and morbidity.  The differential diagnosis includes ***   Co morbidities that complicate the patient evaluation  ***   Additional history obtained:  Additional history obtained from *** External records from outside source obtained and reviewed including ***   Lab Tests:  I Ordered, and personally interpreted labs.  The pertinent results include:  ***   Imaging Studies ordered:  I ordered imaging studies including ***  I independently visualized and interpreted imaging which showed *** I agree with the radiologist interpretation   Cardiac Monitoring: / EKG:  The patient was maintained on a cardiac monitor.  I personally viewed and interpreted the cardiac monitored which showed an underlying rhythm of: ***   Consultations Obtained:  I requested consultation with the ***,  and discussed lab and imaging findings as well as pertinent plan - they recommend: ***   Problem List / ED Course / Critical interventions / Medication  management  Patient presents for concern of hyperglycemia.  On CBG checks at home, sugar was elevated in the range of 550.  He took 30 units of short acting insulin and subsequent blood sugar check was elevated above 600.  When she is, presents to the ED.  He has had recent hospitalization and ED visits for abdominal pain and nausea.  He denies any current nausea.  He states that he has had ongoing lower abdominal pain but has not changed since his prior hospitalization.  Patient denies any other recent symptoms other than fatigue and polyuria.  I suspect the patient has had elevated blood sugar for the past several days.  2 L of IV fluid were ordered.  Laboratory workup was initiated to assess for complications of hyperglycemia.***. I ordered medication including ***  for ***  Reevaluation of the patient after these medicines showed that the patient {resolved/improved/worsened:23923::"improved"} I have reviewed the patients home medicines and have made adjustments as needed   Social Determinants of Health:  ***   Test / Admission - Considered:  ***   {Document critical care time when appropriate:1} {Document review of labs and clinical decision tools ie heart score, Chads2Vasc2 etc:1}  {Document your independent review of radiology images, and any outside records:1} {Document your discussion with family members, caretakers, and with consultants:1} {Document social determinants of health affecting pt's care:1} {Document your decision making why or why not admission, treatments were needed:1} Final Clinical Impression(s) / ED Diagnoses Final diagnoses:  None    Rx / DC Orders ED Discharge Orders     None

## 2022-05-27 NOTE — ED Triage Notes (Signed)
Patient BIB EMS from home c/o hyperglycemia. Per report pt blood sugar at home was 575. Per report 30 units was given by wife. Pt report since he got discharge yesterday his blood sugar never got controlled. Pt denies N/V. Pt a/ox4.  BP160/82 HR 60 RR 20 O2sat 100% on RA.

## 2022-05-27 NOTE — Telephone Encounter (Signed)
Transition Care Management Follow-up Telephone Call Date of discharge and from where: [name] 05/23/2022 How have you been since you were released from the hospital? Tired but better Any questions or concerns? No  Items Reviewed: Did the pt receive and understand the discharge instructions provided? Yes  Medications obtained and verified? Yes  Other? No  Any new allergies since your discharge? No  Dietary orders reviewed? Yes Do you have support at home? Yes   Home Care and Equipment/Supplies: Were home health services ordered? no If so, what is the name of the agency? N/a  Has the agency set up a time to come to the patient's home? not applicable Were any new equipment or medical supplies ordered?  No What is the name of the medical supply agency? N/a Were you able to get the supplies/equipment? not applicable Do you have any questions related to the use of the equipment or supplies? No  Functional Questionnaire: (I = Independent and D = Dependent) ADLs: I  Bathing/Dressing- I  Meal Prep- I  Eating- I  Maintaining continence- I  Transferring/Ambulation- I  Managing Meds- I  Follow up appointments reviewed:  PCP Hospital f/u appt confirmed? No  , no avail appts. Sent message to staff to schedule Specialist Hospital f/u appt confirmed? No  Patient to call and schedule Are transportation arrangements needed? No  If their condition worsens, is the pt aware to call PCP or go to the Emergency Dept.? Yes Was the patient provided with contact information for the PCP's office or ED? Yes Was to pt encouraged to call back with questions or concerns? Yes [name], LPN [place] Direct Dial [phone]

## 2022-05-28 DIAGNOSIS — D72829 Elevated white blood cell count, unspecified: Secondary | ICD-10-CM | POA: Diagnosis present

## 2022-05-28 DIAGNOSIS — E1165 Type 2 diabetes mellitus with hyperglycemia: Secondary | ICD-10-CM | POA: Diagnosis present

## 2022-05-28 LAB — URINALYSIS, ROUTINE W REFLEX MICROSCOPIC
Bacteria, UA: NONE SEEN
Bilirubin Urine: NEGATIVE
Glucose, UA: >=500 mg/dL — AB
Hgb urine dipstick: NEGATIVE
Ketones, ur: NEGATIVE mg/dL
Leukocytes,Ua: NEGATIVE
Nitrite: NEGATIVE
Protein, ur: 30 mg/dL — AB
Specific Gravity, Urine: 1.018 (ref 1.005–1.030)
pH: 5 (ref 5.0–8.0)

## 2022-05-28 LAB — CBC
HCT: 30.5 % — ABNORMAL LOW (ref 39.0–52.0)
Hemoglobin: 10.1 g/dL — ABNORMAL LOW (ref 13.0–17.0)
MCH: 27.4 pg (ref 26.0–34.0)
MCHC: 33.1 g/dL (ref 30.0–36.0)
MCV: 82.7 fL (ref 80.0–100.0)
Platelets: 207 10*3/uL (ref 150–400)
RBC: 3.69 MIL/uL — ABNORMAL LOW (ref 4.22–5.81)
RDW: 11.9 % (ref 11.5–15.5)
WBC: 14.9 10*3/uL — ABNORMAL HIGH (ref 4.0–10.5)
nRBC: 0 % (ref 0.0–0.2)

## 2022-05-28 LAB — GLUCOSE, CAPILLARY: Glucose-Capillary: 231 mg/dL — ABNORMAL HIGH (ref 70–99)

## 2022-05-28 LAB — CBG MONITORING, ED
Glucose-Capillary: 170 mg/dL — ABNORMAL HIGH (ref 70–99)
Glucose-Capillary: 193 mg/dL — ABNORMAL HIGH (ref 70–99)
Glucose-Capillary: 270 mg/dL — ABNORMAL HIGH (ref 70–99)
Glucose-Capillary: 330 mg/dL — ABNORMAL HIGH (ref 70–99)
Glucose-Capillary: 429 mg/dL — ABNORMAL HIGH (ref 70–99)
Glucose-Capillary: 494 mg/dL — ABNORMAL HIGH (ref 70–99)

## 2022-05-28 LAB — BASIC METABOLIC PANEL
Anion gap: 6 (ref 5–15)
BUN: 29 mg/dL — ABNORMAL HIGH (ref 6–20)
CO2: 23 mmol/L (ref 22–32)
Calcium: 8.3 mg/dL — ABNORMAL LOW (ref 8.9–10.3)
Chloride: 102 mmol/L (ref 98–111)
Creatinine, Ser: 1.3 mg/dL — ABNORMAL HIGH (ref 0.61–1.24)
GFR, Estimated: >60 mL/min (ref >60)
Glucose, Bld: 217 mg/dL — ABNORMAL HIGH (ref 70–99)
Potassium: 4 mmol/L (ref 3.5–5.1)
Sodium: 131 mmol/L — ABNORMAL LOW (ref 135–145)

## 2022-05-28 LAB — LIPASE, BLOOD: Lipase: 54 U/L — ABNORMAL HIGH (ref 11–51)

## 2022-05-28 LAB — PHOSPHORUS: Phosphorus: 3.6 mg/dL (ref 2.5–4.6)

## 2022-05-28 MED ORDER — ONDANSETRON HCL 4 MG/2ML IJ SOLN
4.0000 mg | Freq: Four times a day (QID) | INTRAMUSCULAR | Status: DC | PRN
  Administered 2022-05-28 – 2022-05-30 (×2): 4 mg via INTRAVENOUS
  Filled 2022-05-28 (×2): qty 2

## 2022-05-28 MED ORDER — DEXAMETHASONE 4 MG PO TABS
6.0000 mg | ORAL_TABLET | Freq: Every day | ORAL | Status: DC
  Filled 2022-05-28: qty 1

## 2022-05-28 MED ORDER — PANTOPRAZOLE SODIUM 40 MG PO TBEC
40.0000 mg | DELAYED_RELEASE_TABLET | Freq: Every day | ORAL | Status: DC
  Administered 2022-05-29 – 2022-05-31 (×3): 40 mg via ORAL
  Filled 2022-05-28 (×3): qty 1

## 2022-05-28 MED ORDER — INSULIN ASPART 100 UNIT/ML IJ SOLN
0.0000 [IU] | Freq: Every day | INTRAMUSCULAR | Status: DC
  Administered 2022-05-28: 2 [IU] via SUBCUTANEOUS
  Administered 2022-05-29: 4 [IU] via SUBCUTANEOUS
  Administered 2022-05-30: 5 [IU] via SUBCUTANEOUS
  Filled 2022-05-28: qty 0.05

## 2022-05-28 MED ORDER — LACTATED RINGERS IV BOLUS
1000.0000 mL | Freq: Once | INTRAVENOUS | Status: AC
  Administered 2022-05-28: 1000 mL via INTRAVENOUS

## 2022-05-28 MED ORDER — OXYCODONE HCL 5 MG PO TABS
5.0000 mg | ORAL_TABLET | ORAL | Status: AC | PRN
  Administered 2022-05-28 – 2022-05-30 (×4): 5 mg via ORAL
  Filled 2022-05-28 (×4): qty 1

## 2022-05-28 MED ORDER — ONDANSETRON HCL 4 MG PO TABS: 4.0000 mg | ORAL_TABLET | Freq: Four times a day (QID) | ORAL | Status: DC | PRN

## 2022-05-28 MED ORDER — INSULIN ASPART 100 UNIT/ML IJ SOLN
0.0000 [IU] | Freq: Three times a day (TID) | INTRAMUSCULAR | Status: DC
  Administered 2022-05-28: 4 [IU] via SUBCUTANEOUS
  Administered 2022-05-28: 11 [IU] via SUBCUTANEOUS
  Administered 2022-05-28: 4 [IU] via SUBCUTANEOUS
  Administered 2022-05-29: 7 [IU] via SUBCUTANEOUS
  Administered 2022-05-29: 4 [IU] via SUBCUTANEOUS
  Administered 2022-05-29 – 2022-05-30 (×2): 15 [IU] via SUBCUTANEOUS
  Administered 2022-05-30 (×2): 11 [IU] via SUBCUTANEOUS
  Administered 2022-05-31: 7 [IU] via SUBCUTANEOUS
  Administered 2022-05-31: 15 [IU] via SUBCUTANEOUS
  Filled 2022-05-28: qty 0.2

## 2022-05-28 MED ORDER — OXYCODONE-ACETAMINOPHEN 5-325 MG PO TABS
1.0000 | ORAL_TABLET | Freq: Once | ORAL | Status: AC
  Administered 2022-05-28: 1 via ORAL
  Filled 2022-05-28: qty 1

## 2022-05-28 MED ORDER — TRAMADOL HCL 50 MG PO TABS
50.0000 mg | ORAL_TABLET | Freq: Four times a day (QID) | ORAL | Status: DC | PRN
  Administered 2022-05-28 – 2022-05-31 (×2): 50 mg via ORAL
  Filled 2022-05-28 (×2): qty 1

## 2022-05-28 MED ORDER — CARVEDILOL 25 MG PO TABS
25.0000 mg | ORAL_TABLET | Freq: Two times a day (BID) | ORAL | Status: DC
  Administered 2022-05-28 – 2022-05-31 (×6): 25 mg via ORAL
  Filled 2022-05-28 (×4): qty 1
  Filled 2022-05-28: qty 2
  Filled 2022-05-28: qty 1

## 2022-05-28 MED ORDER — HYDROMORPHONE HCL 1 MG/ML IJ SOLN
1.0000 mg | Freq: Once | INTRAMUSCULAR | Status: AC
  Administered 2022-05-28: 1 mg via INTRAVENOUS
  Filled 2022-05-28: qty 1

## 2022-05-28 MED ORDER — AMLODIPINE BESYLATE 5 MG PO TABS
5.0000 mg | ORAL_TABLET | Freq: Every day | ORAL | Status: DC
  Administered 2022-05-28 – 2022-05-31 (×4): 5 mg via ORAL
  Filled 2022-05-28 (×4): qty 1

## 2022-05-28 MED ORDER — ACETAMINOPHEN 650 MG RE SUPP: 650.0000 mg | Freq: Four times a day (QID) | RECTAL | Status: DC | PRN

## 2022-05-28 MED ORDER — INSULIN ASPART 100 UNIT/ML IJ SOLN
12.0000 [IU] | Freq: Once | INTRAMUSCULAR | Status: AC
  Administered 2022-05-28: 12 [IU] via SUBCUTANEOUS
  Filled 2022-05-28: qty 0.12

## 2022-05-28 MED ORDER — DEXAMETHASONE 4 MG PO TABS
4.0000 mg | ORAL_TABLET | Freq: Every day | ORAL | Status: DC
  Administered 2022-05-28 – 2022-05-30 (×3): 4 mg via ORAL
  Filled 2022-05-28 (×2): qty 1

## 2022-05-28 MED ORDER — ACETAMINOPHEN 325 MG PO TABS
650.0000 mg | ORAL_TABLET | Freq: Four times a day (QID) | ORAL | Status: DC | PRN
  Administered 2022-05-28 – 2022-05-30 (×4): 650 mg via ORAL
  Filled 2022-05-28 (×4): qty 2

## 2022-05-28 MED ORDER — SUCRALFATE 1 G PO TABS
1.0000 g | ORAL_TABLET | Freq: Three times a day (TID) | ORAL | Status: DC
  Administered 2022-05-28 – 2022-05-31 (×9): 1 g via ORAL
  Filled 2022-05-28 (×9): qty 1

## 2022-05-28 MED ORDER — HYDRALAZINE HCL 50 MG PO TABS
100.0000 mg | ORAL_TABLET | Freq: Three times a day (TID) | ORAL | Status: DC
  Administered 2022-05-28 – 2022-05-30 (×6): 100 mg via ORAL
  Filled 2022-05-28 (×7): qty 2

## 2022-05-28 MED ORDER — FOLIC ACID 1 MG PO TABS
1.0000 mg | ORAL_TABLET | Freq: Every day | ORAL | Status: DC
  Administered 2022-05-28 – 2022-05-31 (×4): 1 mg via ORAL
  Filled 2022-05-28 (×4): qty 1

## 2022-05-28 MED ORDER — ATORVASTATIN CALCIUM 40 MG PO TABS
80.0000 mg | ORAL_TABLET | Freq: Every day | ORAL | Status: DC
  Administered 2022-05-29 – 2022-05-31 (×3): 80 mg via ORAL
  Filled 2022-05-28 (×3): qty 2

## 2022-05-28 NOTE — ED Provider Notes (Signed)
Care assumed at 2330. Pt here with hyperglycemia.  Care assumed pending improvement in blood glucose after IV fluids and insulin.  Patient states that his sugar has been running high for the last month at 500-600 at home.  He takes 35 units of insulin daily but no sliding scale coverage.  He does report excessive thirst and urination. Repeat CBG after insulin in the 400s.  No evidence of DKA and renal function is stable.  Will start on sliding scale insulin and provide additional fluids.  Medicine consulted for further management of severe hyperglycemia.   Quintella Reichert, MD 05/28/22 [phone]

## 2022-05-28 NOTE — H&P (Signed)
History and Physical    Patient: Randy [name]. [id number]:[id number] DOB: [birth year] DOA: 05/27/2022 DOS: the patient was seen and examined on 05/28/2022 PCP: Camillia Herter, NP  Patient coming from: Home  Chief Complaint:  Chief Complaint  Patient presents with   Hyperglycemia   HPI: Randy [name]. is a 42 y.o. male with medical history significant of essential hypertension, hyperlipidemia, pancreatitis, type 2 diabetes, CKD who was recently admitted and discharged due to [place] and returns to the emergency department due to hyperglycemia associated with polyuria and polydipsia.  He has not missed any insulin doses.  He is feeling better from his viral illness. He denied fever, chills, current rhinorrhea or sore throat, wheezing or hemoptysis.  No chest pain, palpitations, diaphoresis, PND, orthopnea or pitting edema of the lower extremities.  He has been having epigastric pain radiated to the left side of his back, but no nausea, emesis, diarrhea, constipation, melena or hematochezia.  No flank pain, dysuria, frequency or hematuria.   ED course: Initial vital signs were temperature 97.8 F, pulse 62, respiration 12, BP 160/87 mmHg O2 sat 99% on room air.  The patient received LR 3000 mL bolus, tablet of Percocet, NovoLog 12 units SQ x 1 and hydromorphone 1 mg IVP x 1.  Lab work: Urinalysis was straw with glucosuria more than 500 and proteinuria 30 mg/dL.  CBC showed a white count 13.6, hemoglobin 10.5 g/dL platelets 283.  Venous blood gas with a normal pH pCO2 of 38 and pO2 of 64 mmHg.  The rest of the measurements were normal.  Magnesium is 2.1 mg/dL.  Beta-hydroxybutyrate [place] 0.15 mmol/L.  CMP shows a sodium 123, potassium 4.8, chloride 95 and CO2 19 mmol/L with a normal anion gap.  Glucose 626, BUN 38, creatinine 1.64 and calcium 8.0 mg/dL.  Total protein was 6.4 and albumin 3.0 g/dL.  The rest of the LFTs were normal.   Review of Systems: As mentioned in the history of present illness.  All other systems reviewed and are negative.  Past Medical History:  Diagnosis Date   Essential hypertension 05/31/2019   HLD (hyperlipidemia)    Pancreatitis    Type II diabetes mellitus ([place])    Past Surgical History:  Procedure Laterality Date   NO PAST SURGERIES     Social History:  reports that he quit smoking about 10 years ago. His smoking use included cigarettes. He has a 5.61 pack-year smoking history. He has been exposed to tobacco smoke. He has never used smokeless tobacco. He reports current drug use. Drug: Marijuana. He reports that he does not drink alcohol.  Allergies  Allergen Reactions   Tomato Itching    Family History  Problem Relation Age of Onset   Hypertension Mother    Diabetes Father    Hypertension Father    Diabetes Brother    Diabetes Maternal Grandmother    Colon cancer Neg Hx    Esophageal cancer Neg Hx    Rectal cancer Neg Hx    Stomach cancer Neg Hx     Prior to Admission medications   Medication Sig Start Date End Date Taking? Authorizing Provider  acetaminophen (TYLENOL) 325 MG tablet Take 2 tablets (650 mg total) by mouth every 6 (six) hours as needed for mild pain (or Fever >/= 101). 05/23/22   Raiford Noble Latif, DO  amLODipine (NORVASC) 5 MG tablet Take 2 tablets (10 mg total) by mouth daily. Patient taking differently: Take 5 mg by mouth in the morning  and at bedtime. 05/12/22 07/11/22  Marinda Elk, MD  ascorbic acid (VITAMIN C) 500 MG tablet Take 1 tablet (500 mg total) by mouth daily. 05/24/22   Marguerita Merles Latif, DO  atorvastatin (LIPITOR) 40 MG tablet Take 2 tablets (80 mg total) by mouth daily. 05/12/22 09/09/22  Marinda Elk, MD  carvedilol (COREG) 25 MG tablet Take 1 tablet (25 mg total) by mouth 2 (two) times daily with a meal. 05/12/22 09/09/22  Marinda Elk, MD  dexamethasone (DECADRON) 6 MG tablet Take 1 tablet (6 mg total) by mouth daily for 6 days. 05/24/22 05/30/22  Marguerita Merles Latif, DO  folic acid  (FOLVITE) 1 MG tablet Take 1 tablet (1 mg total) by mouth daily. 05/24/22   Marguerita Merles Latif, DO  glucose blood (ACCU-CHEK GUIDE) test strip Use to check blood sugar 3 times daily. 10/15/21   Hoy Register, MD  guaiFENesin-dextromethorphan (ROBITUSSIN DM) 100-10 MG/5ML syrup Take 10 mLs by mouth every 4 (four) hours as needed for cough. 05/23/22   Marguerita Merles Latif, DO  hydrALAZINE (APRESOLINE) 100 MG tablet Take 1 tablet (100 mg total) by mouth every 8 (eight) hours. 05/12/22 07/11/22  Marinda Elk, MD  Insulin Glargine [facility]) 100 UNIT/ML Inject 18 Units into the skin daily. 05/26/22 07/25/22  Tanda Rockers A, DO  Insulin Pen Needle (TRUEPLUS 5-BEVEL PEN NEEDLES) 32G X 4 MM MISC Use to inject basaglar once daily. 01/30/22   Rema Fendt, NP  Ipratropium-Albuterol (COMBIVENT) 20-100 MCG/ACT AERS respimat Inhale 1 puff into the lungs every 6 (six) hours as needed for wheezing or shortness of breath. 05/23/22   Marguerita Merles Latif, DO  Multiple Vitamin (MULTIVITAMIN WITH MINERALS) TABS tablet Take 1 tablet by mouth daily. 05/24/22   Sheikh, Omair Latif, DO  ondansetron (ZOFRAN) 4 MG tablet Take 1 tablet (4 mg total) by mouth every 4 (four) hours as needed for nausea or vomiting. 05/26/22   Sloan Leiter, DO  ondansetron (ZOFRAN-ODT) 4 MG disintegrating tablet Take 1 tablet (4 mg total) by mouth every 8 (eight) hours as needed for nausea or vomiting. 03/27/22   Tilden Fossa, MD  oxyCODONE (ROXICODONE) 5 MG immediate release tablet Take 1 tablet (5 mg total) by mouth every 4 (four) hours as needed for severe pain. 05/26/22   Sloan Leiter, DO  pantoprazole (PROTONIX) 40 MG tablet Take 1 tablet (40 mg total) by mouth 2 (two) times daily. Patient taking differently: Take 40 mg by mouth daily before breakfast. 05/12/22 05/12/23  Marinda Elk, MD  sodium chloride 1 g tablet Take 1 tablet (1 g total) by mouth 2 (two) times daily with a meal for 3 days. 05/26/22 05/29/22  Sloan Leiter, DO  sucralfate (CARAFATE) 1 g tablet Take 1 tablet (1 g total) by mouth with breakfast, with lunch, and with evening meal for 7 days. 05/26/22 06/02/22  Tanda Rockers A, DO  thiamine (VITAMIN B-1) 100 MG tablet Take 1 tablet (100 mg total) by mouth daily. 05/24/22   Marguerita Merles Latif, DO  traMADol (ULTRAM) 50 MG tablet Take 1 tablet (50 mg total) by mouth every 6 (six) hours as needed. 05/23/22   Marguerita Merles Latif, DO  zinc sulfate 220 (50 Zn) MG capsule Take 1 capsule (220 mg total) by mouth daily. 05/24/22   Merlene Laughter, DO    Physical Exam: Vitals:   05/28/22 0400 05/28/22 0500 05/28/22 0600 05/28/22 0639  BP: (!) 157/98 (!) 159/98 (!) 158/123  Pulse: 64 67 73   Resp: 11 10 13    Temp:    97.7 F (36.5 C)  TempSrc:    Oral  SpO2: 98% 99% 99%   Weight:      Height:       Physical Exam Vitals and nursing note reviewed.  Constitutional:      General: He is awake. He is not in acute distress. HENT:     Head: Normocephalic.     Nose: No rhinorrhea.     Mouth/Throat:     Mouth: Mucous membranes are moist.  Eyes:     General: No scleral icterus.    Pupils: Pupils are equal, round, and reactive to light.  Neck:     Vascular: No JVD.  Cardiovascular:     Rate and Rhythm: Normal rate and regular rhythm.     Heart sounds: S1 normal and S2 normal.  Pulmonary:     Effort: Pulmonary effort is normal.     Breath sounds: Normal breath sounds.  Abdominal:     General: Bowel sounds are normal. There is no distension.     Palpations: Abdomen is soft.     Tenderness: There is abdominal tenderness in the epigastric area. There is left CVA tenderness. There is no right CVA tenderness, guarding or rebound.  Musculoskeletal:     Cervical back: Neck supple.     Right lower leg: No edema.     Left lower leg: No edema.  Skin:    General: Skin is warm and dry.  Neurological:     General: No focal deficit present.     Mental Status: He is alert and oriented to person, place, and  time.  Psychiatric:        Mood and Affect: Mood normal.        Behavior: Behavior normal. Behavior is cooperative.   Data Reviewed:  Results are pending, will review when available.  Assessment and Plan: Principal Problem:   Uncontrolled type 2 diabetes mellitus with hyperglycemia (HCC) Observation/MedSurg Clear liquid diet. Continue IV fluids. Continue home insulin dose. CBG monitoring with RI SS. Replace electrolytes as needed. Taper off dexamethasone.  Active Problems:   Acute pancreatitis Continue IV fluids. Clear liquid diet. Analgesics as needed. Antiemetics as needed. Pantoprazole 40 mg IVP daily. Follow CBC, CMP and lipase in AM.    COVID-19 virus infection Feeling a lot better now. Taper off dexamethasone to avoid hyperglycemia.    HLD (hyperlipidemia) Resume atorvastatin in AM.    Stage 3a chronic kidney disease (CKD) (HCC) Monitor renal function electrolytes.    Hyponatremia Pseudohyponatremia due to hyperglycemia.    Anemia due to chronic kidney disease Monitor renal function electrolytes.    Leukocytosis In the setting of glucocorticoid use. Follow WBC in the morning..    Essential hypertension Resume amlodipine 10 mg p.o. daily. Resume carvedilol 25 mg p.o. twice daily. Resume hydralazine 100 mg p.o. every 8 hours. Monitor blood pressure and heart rate.    Advance Care Planning:   Code Status: Full Code   Consults:   Family Communication:   Severity of Illness: The appropriate patient status for this patient is OBSERVATION. Observation status is judged to be reasonable and necessary in order to provide the required intensity of service to ensure the patient's safety. The patient's presenting symptoms, physical exam findings, and initial radiographic and laboratory data in the context of their medical condition is felt to place them at decreased risk for further clinical deterioration. Furthermore, it is anticipated  that the patient will be  medically stable for discharge from the hospital within 2 midnights of admission.   Author: Reubin Milan, MD 05/28/2022 8:33 AM  For on call review [email or web address].   This document was prepared using Dragon voice recognition software and may contain some unintended transcription errors.

## 2022-05-28 NOTE — ED Provider Notes (Signed)
Pt signed out by Dr. Ralene Bathe.  Pt awaiting medicine consult for hyperglycemia.  Dr. Olevia Bowens saw pt and will admit.   Isla Pence, MD 05/28/22 [phone]

## 2022-05-28 NOTE — Discharge Instructions (Addendum)
Your blood sugar was elevated today.  Fortunately, no complications of this high blood sugar were noted on your lab work.  You were given fluids and insulin.  It is important that you continue to stay hydrated and discuss ongoing management of your diabetes with your primary care doctor.  This will require medication changes.  Follow-up with your primary care doctor soon as possible to discuss this.  Return to the emergency department for any new or worsening symptoms of concern.

## 2022-05-28 NOTE — ED Notes (Signed)
Admitting MD at BS.  

## 2022-05-29 ENCOUNTER — Other Ambulatory Visit: Payer: Self-pay

## 2022-05-29 DIAGNOSIS — E1165 Type 2 diabetes mellitus with hyperglycemia: Secondary | ICD-10-CM | POA: Diagnosis not present

## 2022-05-29 LAB — COMPREHENSIVE METABOLIC PANEL
ALT: 22 U/L (ref 0–44)
AST: 19 U/L (ref 15–41)
Albumin: 2.6 g/dL — ABNORMAL LOW (ref 3.5–5.0)
Alkaline Phosphatase: 65 U/L (ref 38–126)
Anion gap: 6 (ref 5–15)
BUN: 27 mg/dL — ABNORMAL HIGH (ref 6–20)
CO2: 24 mmol/L (ref 22–32)
Calcium: 7.9 mg/dL — ABNORMAL LOW (ref 8.9–10.3)
Chloride: 100 mmol/L (ref 98–111)
Creatinine, Ser: 1.34 mg/dL — ABNORMAL HIGH (ref 0.61–1.24)
GFR, Estimated: >60 mL/min (ref >60)
Glucose, Bld: 221 mg/dL — ABNORMAL HIGH (ref 70–99)
Potassium: 4.2 mmol/L (ref 3.5–5.1)
Sodium: 130 mmol/L — ABNORMAL LOW (ref 135–145)
Total Bilirubin: 0.4 mg/dL (ref 0.3–1.2)
Total Protein: 5.6 g/dL — ABNORMAL LOW (ref 6.5–8.1)

## 2022-05-29 LAB — CBC
HCT: 29 % — ABNORMAL LOW (ref 39.0–52.0)
Hemoglobin: 9.9 g/dL — ABNORMAL LOW (ref 13.0–17.0)
MCH: 27.7 pg (ref 26.0–34.0)
MCHC: 34.1 g/dL (ref 30.0–36.0)
MCV: 81 fL (ref 80.0–100.0)
Platelets: 312 10*3/uL (ref 150–400)
RBC: 3.58 MIL/uL — ABNORMAL LOW (ref 4.22–5.81)
RDW: 11.7 % (ref 11.5–15.5)
WBC: 13.6 10*3/uL — ABNORMAL HIGH (ref 4.0–10.5)
nRBC: 0 % (ref 0.0–0.2)

## 2022-05-29 LAB — GLUCOSE, CAPILLARY
Glucose-Capillary: 294 mg/dL — ABNORMAL HIGH (ref 70–99)
Glucose-Capillary: 180 mg/dL — ABNORMAL HIGH (ref 70–99)
Glucose-Capillary: 246 mg/dL — ABNORMAL HIGH (ref 70–99)
Glucose-Capillary: 337 mg/dL — ABNORMAL HIGH (ref 70–99)
Glucose-Capillary: 345 mg/dL — ABNORMAL HIGH (ref 70–99)

## 2022-05-29 LAB — LIPASE, BLOOD: Lipase: 182 U/L — ABNORMAL HIGH (ref 11–51)

## 2022-05-29 MED ORDER — HYDROMORPHONE HCL 1 MG/ML IJ SOLN
0.5000 mg | Freq: Once | INTRAMUSCULAR | Status: AC
  Administered 2022-05-29: 0.5 mg via INTRAVENOUS
  Filled 2022-05-29: qty 0.5

## 2022-05-29 MED ORDER — HYDROMORPHONE HCL 1 MG/ML IJ SOLN
1.0000 mg | Freq: Once | INTRAMUSCULAR | Status: AC
  Administered 2022-05-29: 1 mg via INTRAVENOUS
  Filled 2022-05-29: qty 1

## 2022-05-29 NOTE — Progress Notes (Signed)
PROGRESS NOTE    Randy [name].  YQM:[id number] DOB: 13-Mar-1981 DOA: 05/27/2022 PCP: Camillia Herter, NP     Brief Narrative:  H/o, HTN, HLD,  IDDM2, pancreatitis, recurrent nausea and vomiting requiring hospitalization ,CKD 2, frequent hospitalizations return to the hospital due to elevated blood glucose  1/7-1/11, treated for intractable nausea and vomiting, COVID infection  1/13-1/14, seen in the ED for abdominal pain,  lipase 54,  CT ab "1. Increased faint perinephric stranding, which could be due to fluid overload or inflammatory process. There are no areas of hypoenhancement in the kidneys typically seen with pyelonephritis. There is no bladder thickening either. No urinary stones. 2. Constipation and diverticulosis. 3. Aortoiliac atherosclerosis." Feeling better discharged home  1/15 Patient BIB EMS from home c/o hyperglycemia. Per report pt blood sugar at home was 575.  Admitted for uncontrolled blood glucose with hypoglycemia, pancreatitis   Subjective:  Lipase went up from 54 to 182 this am, he states ab pain is better, desires to eat  Assessment & Plan:  Principal Problem:   Uncontrolled type 2 diabetes mellitus with hyperglycemia (HCC) Active Problems:   COVID-19 virus infection   HLD (hyperlipidemia)   Stage 3a chronic kidney disease (CKD) (HCC)   Essential hypertension   Acute pancreatitis   Hyponatremia   Anemia due to chronic kidney disease   Leukocytosis    Assessment and Plan:  Uncontrolled diabetes with hyperglycemia Blood glucose 626 on presentation ,improving continue adjust insulin as needed  Hyponatremia Likely component of pseudohyponatremia, and some dehydration Sodium 123 on presentation, improved to 130 today Repeat BMP in the morning  Recurrent pancreatitis Lipase went up, report Pain has improved , patient desired to have diet advanced  Left CVA tenderness, reported on admission  -UA no bacteria, no tenderness  today   Leukocytosis Possibly due to steroid/stress/dehydration UA no bacteria add on blood culture  Normocytic anemia Hemoglobin appears stable at baseline Monitor  CKDII Creatinine.  At baseline Renal dosing medication  Hypertension Pressure stable on current regimen   I have Reviewed nursing notes, Vitals, pain scores, I/o's, Lab results and  imaging results since pt's last encounter, details please see discussion above  I ordered the following labs:  Unresulted Labs (From admission, onward)     Start     Ordered   05/30/22 0500  CBC with Differential/Platelet  Tomorrow morning,   R        05/29/22 0815   05/30/22 0500  Comprehensive metabolic panel  Tomorrow morning,   R        05/29/22 0815   05/29/22 0500  Lipase, blood  Daily,   R      05/28/22 1941             DVT prophylaxis: SCDs Start: 05/28/22 5284   Code Status:   Code Status: Full Code  Family Communication: None at bedside Disposition:   Status is: Observation   Dispo: The patient is from: Home              Anticipated d/c is to: Home              Anticipated d/c date is: 24-48hrs pending clinical improvement and lipase level  Antimicrobials:     Anti-infectives (From admission, onward)    None         Objective: Vitals:   05/28/22 1900 05/28/22 2032 05/29/22 0141 05/29/22 0559  BP:  (!) 145/89 (!) 142/92 (!) 145/103  Pulse:  63 64 70  Resp:  18 17 18   Temp: 98 F (36.7 C) 97.7 F (36.5 C) 98 F (36.7 C) (!) 97.5 F (36.4 C)  TempSrc:  Oral Oral Oral  SpO2:  100% 100% 100%  Weight:      Height:        Intake/Output Summary (Last 24 hours) at 05/29/2022 0815 Last data filed at 05/29/2022 0600 Gross per 24 hour  Intake 480 ml  Output 1295 ml  Net -815 ml   Filed Weights   05/27/22 2208  Weight: 85 kg    Examination:  General exam: alert, awake, communicative,calm, NAD Respiratory system: Clear to auscultation. Respiratory effort normal. Cardiovascular  system:  RRR.  Gastrointestinal system: Abdomen is mild tender epigastric and mid abdomen, nondistended,  +bowel sounds  Central nervous system: Alert and oriented. No focal neurological deficits. Extremities:  no edema Skin: No rashes, lesions or ulcers Psychiatry: Judgement and insight appear normal. Mood & affect appropriate.     Data Reviewed: I have personally reviewed  labs and visualized  imaging studies since the last encounter and formulate the plan        Scheduled Meds:  amLODipine  5 mg Oral Daily   atorvastatin  80 mg Oral Daily   carvedilol  25 mg Oral BID WC   dexamethasone  4 mg Oral Daily   folic acid  1 mg Oral Daily   hydrALAZINE  100 mg Oral Q8H   insulin aspart  0-20 Units Subcutaneous TID WC   insulin aspart  0-5 Units Subcutaneous QHS   pantoprazole  40 mg Oral QAC breakfast   sucralfate  1 g Oral TID with meals   Continuous Infusions:   LOS: 0 days   Time spent:  21mins  Florencia Reasons, MD PhD FACP [facility]  Available via Epic secure chat 7am-7pm for nonurgent issues Please page for urgent issues To page the attending provider between 7A-7P or the covering provider during after hours 7P-7A, please log into the web site [email or web address] and access using universal [place] password for that web site. If you do not have the password, please call the hospital operator.    05/29/2022, 8:15 AM

## 2022-05-29 NOTE — TOC CM/SW Note (Signed)
Transition of Care [facility]) Screening Note  Patient Details  Name: Randy [name]. Date of Birth: 1981-03-17  [facility]) CM/SW Contact:    Sherie Don, LCSW Phone Number: 05/29/2022, 10:16 AM  Transition of Care Department [facility]) has reviewed patient and no TOC needs have been identified at this time. We will continue to monitor patient advancement through interdisciplinary progression rounds. If new patient transition needs arise, please place a TOC consult.

## 2022-05-29 NOTE — Inpatient Diabetes Management (Addendum)
Inpatient Diabetes Program Recommendations  AACE/ADA: New Consensus Statement on Inpatient Glycemic Control (2015)  Target Ranges:  Prepandial:   less than 140 mg/dL      Peak postprandial:   less than 180 mg/dL (1-2 hours)      Critically ill patients:  140 - 180 mg/dL   Lab Results  Component Value Date   GLUCAP 180 (H) 05/29/2022   HGBA1C 9.2 (H) 05/10/2022    Review of Glycemic Control  Diabetes history: DM2 Outpatient Diabetes medications: Basaglar 18 QD Current orders for Inpatient glycemic control: Novolog 0-20 TID with meals and 0-5 HS  HgbA1C - 9.2% (average blood sugar of 217 mg/dL each day)  CBGs 1/16: 193-270 mg/dL CBGs 1/17: 221, 180 mg/dL  Inpatient Diabetes Program Recommendations:    Add Semglee 8 units QD (half home dose)  Add Novolog 3 units TID with meals if eating > 50% (when diet advanced)  Will speak with pt by phone today regarding his HgbA1C of 9.2% and confirm home meds.   Continue to follow.  Thank you. Lorenda Peck, RD, LDN, CDCES Inpatient Diabetes Coordinator [phone]  Addendum: Spoke with pt on phone regarding his diabetes control, HgbA1C of 9.2%. Pt states he takes Basaglar 35 units every morning and monitors blood sugars daily. Has not had PCP to manage his diabetes and now has appt with [facility] in February.  RV

## 2022-05-30 DIAGNOSIS — E1165 Type 2 diabetes mellitus with hyperglycemia: Secondary | ICD-10-CM | POA: Diagnosis not present

## 2022-05-30 LAB — COMPREHENSIVE METABOLIC PANEL
ALT: 21 U/L (ref 0–44)
AST: 20 U/L (ref 15–41)
Albumin: 2.5 g/dL — ABNORMAL LOW (ref 3.5–5.0)
Alkaline Phosphatase: 76 U/L (ref 38–126)
Anion gap: 8 (ref 5–15)
BUN: 31 mg/dL — ABNORMAL HIGH (ref 6–20)
CO2: 23 mmol/L (ref 22–32)
Calcium: 7.8 mg/dL — ABNORMAL LOW (ref 8.9–10.3)
Chloride: 98 mmol/L (ref 98–111)
Creatinine, Ser: 1.49 mg/dL — ABNORMAL HIGH (ref 0.61–1.24)
GFR, Estimated: 60 mL/min — ABNORMAL LOW (ref >60)
Glucose, Bld: 255 mg/dL — ABNORMAL HIGH (ref 70–99)
Potassium: 4.1 mmol/L (ref 3.5–5.1)
Sodium: 129 mmol/L — ABNORMAL LOW (ref 135–145)
Total Bilirubin: 0.4 mg/dL (ref 0.3–1.2)
Total Protein: 5.6 g/dL — ABNORMAL LOW (ref 6.5–8.1)

## 2022-05-30 LAB — CBC WITH DIFFERENTIAL/PLATELET
Abs Immature Granulocytes: 0.62 10*3/uL — ABNORMAL HIGH (ref 0.00–0.07)
Basophils Absolute: 0 10*3/uL (ref 0.0–0.1)
Basophils Relative: 0 %
Eosinophils Absolute: 0 10*3/uL (ref 0.0–0.5)
Eosinophils Relative: 0 %
HCT: 28.5 % — ABNORMAL LOW (ref 39.0–52.0)
Hemoglobin: 9.7 g/dL — ABNORMAL LOW (ref 13.0–17.0)
Immature Granulocytes: 4 %
Lymphocytes Relative: 13 %
Lymphs Abs: 2.1 10*3/uL (ref 0.7–4.0)
MCH: 27.6 pg (ref 26.0–34.0)
MCHC: 34 g/dL (ref 30.0–36.0)
MCV: 81.2 fL (ref 80.0–100.0)
Monocytes Absolute: 1.4 10*3/uL — ABNORMAL HIGH (ref 0.1–1.0)
Monocytes Relative: 9 %
Neutro Abs: 11.8 10*3/uL — ABNORMAL HIGH (ref 1.7–7.7)
Neutrophils Relative %: 74 %
Platelets: 285 10*3/uL (ref 150–400)
RBC: 3.51 MIL/uL — ABNORMAL LOW (ref 4.22–5.81)
RDW: 11.9 % (ref 11.5–15.5)
WBC: 15.9 10*3/uL — ABNORMAL HIGH (ref 4.0–10.5)
nRBC: 0 % (ref 0.0–0.2)

## 2022-05-30 LAB — GLUCOSE, CAPILLARY
Glucose-Capillary: 260 mg/dL — ABNORMAL HIGH (ref 70–99)
Glucose-Capillary: 322 mg/dL — ABNORMAL HIGH (ref 70–99)
Glucose-Capillary: 290 mg/dL — ABNORMAL HIGH (ref 70–99)
Glucose-Capillary: 393 mg/dL — ABNORMAL HIGH (ref 70–99)

## 2022-05-30 LAB — LIPASE, BLOOD: Lipase: 76 U/L — ABNORMAL HIGH (ref 11–51)

## 2022-05-30 LAB — OSMOLALITY: Osmolality: 291 mOsm/kg (ref 275–295)

## 2022-05-30 LAB — URIC ACID: Uric Acid, Serum: 4.7 mg/dL (ref 3.7–8.6)

## 2022-05-30 LAB — SODIUM, URINE, RANDOM: Sodium, Ur: 43 mmol/L

## 2022-05-30 LAB — OSMOLALITY, URINE: Osmolality, Ur: 502 mOsm/kg (ref 300–900)

## 2022-05-30 MED ORDER — HYDRALAZINE HCL 50 MG PO TABS
100.0000 mg | ORAL_TABLET | Freq: Three times a day (TID) | ORAL | Status: DC
  Administered 2022-05-30 – 2022-05-31 (×2): 100 mg via ORAL
  Filled 2022-05-30 (×2): qty 2

## 2022-05-30 MED ORDER — SENNOSIDES-DOCUSATE SODIUM 8.6-50 MG PO TABS
1.0000 | ORAL_TABLET | Freq: Two times a day (BID) | ORAL | Status: DC
  Administered 2022-05-30 – 2022-05-31 (×2): 1 via ORAL
  Filled 2022-05-30 (×2): qty 1

## 2022-05-30 MED ORDER — POLYETHYLENE GLYCOL 3350 17 G PO PACK
17.0000 g | PACK | Freq: Every day | ORAL | Status: DC
  Administered 2022-05-31: 17 g via ORAL
  Filled 2022-05-30 (×2): qty 1

## 2022-05-30 MED ORDER — INSULIN GLARGINE-YFGN 100 UNIT/ML ~~LOC~~ SOLN
10.0000 [IU] | Freq: Every day | SUBCUTANEOUS | Status: DC
  Administered 2022-05-30: 10 [IU] via SUBCUTANEOUS
  Filled 2022-05-30 (×2): qty 0.1

## 2022-05-30 MED ORDER — HYDRALAZINE HCL 50 MG PO TABS: 100.0000 mg | ORAL_TABLET | Freq: Three times a day (TID) | ORAL | Status: DC

## 2022-05-30 NOTE — Plan of Care (Signed)
  Problem: Coping: Goal: Level of anxiety will decrease Outcome: Progressing   Problem: Pain Managment: Goal: General experience of comfort will improve Outcome: Progressing

## 2022-05-30 NOTE — Progress Notes (Signed)
PROGRESS NOTE    Randy [name].  PYP:[id number] DOB: [birth year] DOA: 05/27/2022 PCP: Camillia Herter, NP     Brief Narrative:  H/o, HTN, HLD,  IDDM2, pancreatitis, recurrent nausea and vomiting requiring hospitalization ,CKD 2, frequent hospitalizations return to the hospital due to elevated blood glucose  1/7-1/11, treated for intractable nausea and vomiting, COVID infection  1/13-1/14, seen in the ED for abdominal pain,  lipase 54,  CT ab "1. Increased faint perinephric stranding, which could be due to fluid overload or inflammatory process. There are no areas of hypoenhancement in the kidneys typically seen with pyelonephritis. There is no bladder thickening either. No urinary stones. 2. Constipation and diverticulosis. 3. Aortoiliac atherosclerosis." Feeling better discharged home  1/15 Patient BIB EMS from home c/o hyperglycemia. Per report pt blood sugar at home was 575.  Admitted for uncontrolled blood glucose with hypoglycemia, pancreatitis   Subjective:  Lipase still elevated but start to come down, reports eating make ab pain worse, but prefers to stay on regular diet Sodium dropped, he reports he drinks a lot of water   Assessment & Plan:  Principal Problem:   Uncontrolled type 2 diabetes mellitus with hyperglycemia ([place]) Active Problems:   COVID-19 virus infection   HLD (hyperlipidemia)   Stage 3a chronic kidney disease (CKD) ([place])   Essential hypertension   Acute pancreatitis   Hyponatremia   Anemia due to chronic kidney disease   Leukocytosis    Assessment and Plan:  Uncontrolled diabetes with hyperglycemia Blood glucose 626 on presentation ,improving continue adjust insulin as needed Stop steroids ( finished the course today)  Hyponatremia Likely component of pseudohyponatremia, and some dehydration Sodium 123 on presentation, improved to 130, then dropped , he reports he drinks a lot of water Will check urine sodium, urine osmo, serum  osmo Free water restriction  Repeat BMP in the morning  Recurrent pancreatitis Lipase remain elevated but starts to trend down, report eating does cause pain, but desired to have regular diet, he states he will be careful   Left CVA tenderness, reported on admission  -UA no bacteria, no tenderness today   Leukocytosis Possibly due to steroid/stress/dehydration UA no bacteria blood culture no growth  Normocytic anemia,  Recent anemia workup from 1/11 consistent with anemia of chronic disease Hemoglobin appears stable at baseline Monitor  CKDII Creatinine.  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 big letters in the very close distance   I have Reviewed nursing notes, Vitals, pain scores, I/o's, Lab results and  imaging results since pt's last encounter, details please see discussion above  I ordered the following labs:  Unresulted Labs (From admission, onward)     Start     Ordered   05/31/22 2458  Basic metabolic panel  Tomorrow morning,   R        05/30/22 1123   05/31/22 0500  CBC with Differential/Platelet  Tomorrow morning,   R        05/30/22 1123   05/30/22 1123  Osmolality  Once,   R        05/30/22 1122   05/30/22 1122  Sodium, urine, random  Once,   R        05/30/22 1122   05/30/22 1122  Osmolality, urine  Once,   R        05/30/22 1122   05/29/22 0500  Lipase, blood  Daily,   R  05/28/22 1941             DVT prophylaxis: SCDs Start: 05/28/22 4034   Code Status:   Code Status: Full Code  Family Communication: None at bedside Disposition:   Status is: Observation   Dispo: The patient is from: Home              Anticipated d/c is to: Home              Anticipated d/c date is: 24-48hrs pending clinical improvement and sodium level  Antimicrobials:     Anti-infectives (From admission, onward)    None         Objective: Vitals:    05/29/22 0559 05/29/22 1400 05/29/22 2155 05/30/22 0558  BP: (!) 145/103 136/76 (!) 160/96 (!) 164/94  Pulse: 70 66 74 (!) 58  Resp: 18 20 18 16   Temp: (!) 97.5 F (36.4 C) 98.7 F (37.1 C) 98.2 F (36.8 C) 97.7 F (36.5 C)  TempSrc: Oral Oral Oral Oral  SpO2: 100% 98% 99% 100%  Weight:      Height:        Intake/Output Summary (Last 24 hours) at 05/30/2022 1124 Last data filed at 05/30/2022 0856 Gross per 24 hour  Intake 720 ml  Output 1600 ml  Net -880 ml   Filed Weights   05/27/22 2208  Weight: 85 kg    Examination:  General exam: alert, awake, communicative,calm, NAD Respiratory system: Clear to auscultation. Respiratory effort normal. Cardiovascular system:  RRR.  Gastrointestinal system: Abdomen is mild tender epigastric and mid abdomen, nondistended,  +bowel sounds  Central nervous system: Alert and oriented. No focal neurological deficits. Extremities:  no edema Skin: No rashes, lesions or ulcers Psychiatry: Judgement and insight appear normal. Mood & affect appropriate.     Data Reviewed: I have personally reviewed  labs and visualized  imaging studies since the last encounter and formulate the plan        Scheduled Meds:  amLODipine  5 mg Oral Daily   atorvastatin  80 mg Oral Daily   carvedilol  25 mg Oral BID WC   dexamethasone  4 mg Oral Daily   folic acid  1 mg Oral Daily   hydrALAZINE  100 mg Oral Q8H   insulin aspart  0-20 Units Subcutaneous TID WC   insulin aspart  0-5 Units Subcutaneous QHS   pantoprazole  40 mg Oral QAC breakfast   sucralfate  1 g Oral TID with meals   Continuous Infusions:   LOS: 0 days   Time spent:  32mins  Florencia Reasons, MD PhD FACP [facility]  Available via Epic secure chat 7am-7pm for nonurgent issues Please page for urgent issues To page the attending provider between 7A-7P or the covering provider during after hours 7P-7A, please log into the web site [email or web address] and access using universal [place]  password for that web site. If you do not have the password, please call the hospital operator.    05/30/2022, 11:24 AM

## 2022-05-31 DIAGNOSIS — E1165 Type 2 diabetes mellitus with hyperglycemia: Secondary | ICD-10-CM | POA: Diagnosis not present

## 2022-05-31 LAB — BASIC METABOLIC PANEL
Anion gap: 11 (ref 5–15)
BUN: 34 mg/dL — ABNORMAL HIGH (ref 6–20)
CO2: 22 mmol/L (ref 22–32)
Calcium: 7.9 mg/dL — ABNORMAL LOW (ref 8.9–10.3)
Chloride: 98 mmol/L (ref 98–111)
Creatinine, Ser: 1.46 mg/dL — ABNORMAL HIGH (ref 0.61–1.24)
GFR, Estimated: >60 mL/min (ref >60)
Glucose, Bld: 364 mg/dL — ABNORMAL HIGH (ref 70–99)
Potassium: 4 mmol/L (ref 3.5–5.1)
Sodium: 131 mmol/L — ABNORMAL LOW (ref 135–145)

## 2022-05-31 LAB — CBC WITH DIFFERENTIAL/PLATELET
Abs Immature Granulocytes: 0.51 10*3/uL — ABNORMAL HIGH (ref 0.00–0.07)
Basophils Absolute: 0 10*3/uL (ref 0.0–0.1)
Basophils Relative: 0 %
Eosinophils Absolute: 0 10*3/uL (ref 0.0–0.5)
Eosinophils Relative: 0 %
HCT: 27.7 % — ABNORMAL LOW (ref 39.0–52.0)
Hemoglobin: 9.4 g/dL — ABNORMAL LOW (ref 13.0–17.0)
Immature Granulocytes: 3 %
Lymphocytes Relative: 13 %
Lymphs Abs: 2.2 10*3/uL (ref 0.7–4.0)
MCH: 28 pg (ref 26.0–34.0)
MCHC: 33.9 g/dL (ref 30.0–36.0)
MCV: 82.4 fL (ref 80.0–100.0)
Monocytes Absolute: 1.6 10*3/uL — ABNORMAL HIGH (ref 0.1–1.0)
Monocytes Relative: 10 %
Neutro Abs: 12.1 10*3/uL — ABNORMAL HIGH (ref 1.7–7.7)
Neutrophils Relative %: 74 %
Platelets: 256 10*3/uL (ref 150–400)
RBC: 3.36 MIL/uL — ABNORMAL LOW (ref 4.22–5.81)
RDW: 11.9 % (ref 11.5–15.5)
WBC: 16.4 10*3/uL — ABNORMAL HIGH (ref 4.0–10.5)
nRBC: 0 % (ref 0.0–0.2)

## 2022-05-31 LAB — LIPASE, BLOOD: Lipase: 150 U/L — ABNORMAL HIGH (ref 11–51)

## 2022-05-31 LAB — GLUCOSE, CAPILLARY
Glucose-Capillary: 316 mg/dL — ABNORMAL HIGH (ref 70–99)
Glucose-Capillary: 250 mg/dL — ABNORMAL HIGH (ref 70–99)

## 2022-05-31 LAB — CORTISOL: Cortisol, Plasma: <0.4 ug/dL

## 2022-05-31 MED ORDER — INSULIN ASPART 100 UNIT/ML FLEXPEN: PEN_INJECTOR | SUBCUTANEOUS | 0 refills | Status: DC

## 2022-05-31 MED ORDER — POLYETHYLENE GLYCOL 3350 17 G PO PACK: 17.0000 g | PACK | Freq: Every day | ORAL | 0 refills | Status: DC

## 2022-05-31 MED ORDER — BASAGLAR KWIKPEN 100 UNIT/ML ~~LOC~~ SOPN: 18.0000 [IU] | PEN_INJECTOR | SUBCUTANEOUS | Status: DC

## 2022-05-31 MED ORDER — HYDROMORPHONE HCL 1 MG/ML IJ SOLN
0.5000 mg | Freq: Once | INTRAMUSCULAR | Status: AC
  Administered 2022-05-31: 0.5 mg via INTRAVENOUS
  Filled 2022-05-31: qty 0.5

## 2022-05-31 MED ORDER — SODIUM CHLORIDE 1 G PO TABS: 1.0000 g | ORAL_TABLET | Freq: Two times a day (BID) | ORAL | 0 refills | Status: DC

## 2022-05-31 NOTE — Plan of Care (Signed)
Patient discharging home via private vehicle with mother. Ivan Anchors, RN 05/31/22 1:48 PM

## 2022-05-31 NOTE — Plan of Care (Signed)
Plan of care reviewed and discussed. °

## 2022-05-31 NOTE — Discharge Summary (Addendum)
Discharge Summary  Randy [name]. ZWC:[id number] DOB: 1981-05-11  PCP: Nolene Ebbs, MD  Admit date: 05/27/2022 Discharge date: 05/31/2022  30 Day Unplanned Readmission Risk Score    Flowsheet Row ED to Hosp-Admission (Discharged) from 05/19/2022 in [facility]-ICU/STEPDOWN  30 Day Unplanned Readmission Risk Score (%) 44.67 Filed at 05/23/2022 1200       This score is the patient's risk of an unplanned readmission within 30 days of being discharged (0 -100%). The score is based on dignosis, age, lab data, medications, orders, and past utilization.   Low:  0-14.9   Medium: 15-21.9   High: 22-29.9   Extreme: 30 and above         Time spent: 39mins, more than 50% time spent on coordination of care.   Recommendations for Outpatient Follow-up:  F[facility]  for hospital discharge follow up, repeat cbc/bmp at follow up, pcp to monitor sodium level and blood glucose control F/u with GI Dr Silverio Decamp for intermittent ab pain, n/v and pancreatitis     Discharge Diagnoses:  Active Hospital Problems   Diagnosis Date Noted   Uncontrolled type 2 diabetes mellitus with hyperglycemia ([place]) 05/28/2022   COVID-19 virus infection 05/20/2022    Priority: 4.   HLD (hyperlipidemia) 06/01/2019    Priority: 6.   Stage 3a chronic kidney disease (CKD) ([place]) 03/27/2022    Priority: 7.   Leukocytosis 05/28/2022   Anemia due to chronic kidney disease 03/28/2022   Hyponatremia 01/04/2022   Acute pancreatitis 09/17/2020   Essential hypertension 05/31/2019    Resolved Hospital Problems  No resolved problems to display.    Discharge Condition: stable  Diet recommendation: heart healthy/carb modified  Filed Weights   05/27/22 2208  Weight: 85 kg    History of present illness: ( per admitting provider Dr. Olevia Bowens) HPI: Randy [name] Randy [name]. is a 42 y.o. male with medical history significant of essential hypertension, hyperlipidemia, pancreatitis,  type 2 diabetes, CKD who was recently admitted and discharged due to [place] and returns to the emergency department due to hyperglycemia associated with polyuria and polydipsia.  He has not missed any insulin doses.  He is feeling better from his viral illness. He denied fever, chills, current rhinorrhea or sore throat, wheezing or hemoptysis.  No chest pain, palpitations, diaphoresis, PND, orthopnea or pitting edema of the lower extremities.  He has been having epigastric pain radiated to the left side of his back, but no nausea, emesis, diarrhea, constipation, melena or hematochezia.  No flank pain, dysuria, frequency or hematuria.    ED course: Initial vital signs were temperature 97.8 F, pulse 62, respiration 12, BP 160/87 mmHg O2 sat 99% on room air.  The patient received LR 3000 mL bolus, tablet of Percocet, NovoLog 12 units SQ x 1 and hydromorphone 1 mg IVP x 1.   Lab work: Urinalysis was straw with glucosuria more than 500 and proteinuria 30 mg/dL.  CBC showed a white count 13.6, hemoglobin 10.5 g/dL platelets 283.  Venous blood gas with a normal pH pCO2 of 38 and pO2 of 64 mmHg.  The rest of the measurements were normal.  Magnesium is 2.1 mg/dL.  Beta-hydroxybutyrate [place] 0.15 mmol/L.  CMP shows a sodium 123, potassium 4.8, chloride 95 and CO2 19 mmol/L with a normal anion gap.  Glucose 626, BUN 38, creatinine 1.64 and calcium 8.0 mg/dL.  Total protein was 6.4 and albumin 3.0 g/dL.  The rest of the LFTs were normal.  Hospital Course:  Principal Problem:   Uncontrolled type 2 diabetes mellitus with hyperglycemia (HCC) Active Problems:   COVID-19 virus infection   HLD (hyperlipidemia)   Stage 3a chronic kidney disease (CKD) (HCC)   Essential hypertension   Acute pancreatitis   Hyponatremia   Anemia due to chronic kidney disease   Leukocytosis   Assessment and Plan:  Brief Narrative:  H/o, HTN, HLD,  IDDM2, pancreatitis, recurrent nausea and vomiting requiring hospitalization ,CKD 2,  frequent hospitalizations return to the hospital due to elevated blood glucose   1/7-1/11, treated for intractable nausea and vomiting, COVID infection   1/13-1/14, seen in the ED for abdominal pain,  lipase 54,  CT ab "1. Increased faint perinephric stranding, which could be due to fluid overload or inflammatory process. There are no areas of hypoenhancement in the kidneys typically seen with pyelonephritis. There is no bladder thickening either. No urinary stones. 2. Constipation and diverticulosis. 3. Aortoiliac atherosclerosis." Feeling better discharged home   1/15 Patient BIB EMS from home c/o hyperglycemia. Per report pt blood sugar at home was 575.  Admitted for uncontrolled blood glucose with hypoglycemia, pancreatitis    Uncontrolled diabetes with hyperglycemia Blood glucose 626 on presentation ,improving continue adjust insulin as needed Stop steroids  Continue home regimen, f/u with pcp to discuss continuous blood glucose monitoring   Hyponatremia Likely component of pseudohyponatremia, and some dehydration Sodium 123 on presentation, improved to 130, then dropped , he reports he drinks a lot of water urine sodium, urine osmo also plan component of SIADH he does reports he drinks a lot of free water daily, Free water restriction ,  Sodium improved on repeat F/u with pcp   Recurrent 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 Increased faint perinephric stranding, which could be due to fluid overload or inflammatory process. There are no areas of hypoenhancement in the kidneys typically seen with pyelonephritis. There is no bladder thickening either. No urinary stones." -UA  no bacteria, no tenderness on exam since admitted      Leukocytosis Possibly due to steroid/stress/dehydration UA no bacteria blood culture no growth F/u with pcp   Normocytic anemia,  Recent anemia workup from 1/11 consistent with anemia of chronic disease Hemoglobin appears stable at baseline Monitor   CKDII Creatinine.  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 in the very close distance Lives with his mother    Family communication: Mother updated over the phone prior to discharge   Discharge Exam: BP (!) 149/93 (BP Location: Right Arm)   Pulse (!) 59   Temp 98 F (36.7 C) (Oral)   Resp 18   Ht 5\' 8"  (1.727 m)   Wt 85 kg   SpO2 98%   BMI 28.49 kg/m   General: NAD, poor vision  Cardiovascular: RRR Respiratory: Normal respiratory effort    Discharge Instructions     Ambulatory referral to Nutrition and Diabetic Education   Complete by: As directed    Diet Carb Modified   Complete by: As directed    Increase activity slowly   Complete by: As directed  Allergies as of 05/31/2022       Reactions   Tomato Itching        Medication List     STOP taking these medications    ascorbic acid 500 MG tablet Commonly known as: VITAMIN C   dexamethasone 6 MG tablet Commonly known as: DECADRON   guaiFENesin-dextromethorphan 100-10 MG/5ML syrup Commonly known as: ROBITUSSIN DM   ondansetron 4 MG disintegrating tablet Commonly known as: ZOFRAN-ODT   oxyCODONE 5 MG immediate release tablet Commonly known as: Roxicodone   sucralfate 1 g tablet Commonly known as: Carafate       TAKE these medications    Accu-Chek Guide test strip Generic drug: glucose blood Use to check blood sugar 3 times daily.   acetaminophen 325 MG tablet Commonly known as: TYLENOL Take 2 tablets (650 mg total) by mouth every 6 (six) hours as needed for mild pain  (or Fever >/= 101).   amLODipine 5 MG tablet Commonly known as: NORVASC Take 2 tablets (10 mg total) by mouth daily. What changed:  how much to take when to take this   atorvastatin 40 MG tablet Commonly known as: LIPITOR Take 2 tablets (80 mg total) by mouth daily.   Basaglar KwikPen 100 UNIT/ML Inject 18-35 Units into the skin See admin instructions. Inject 18-35 units into the skin, per sliding scale   carvedilol 25 MG tablet Commonly known as: COREG Take 1 tablet (25 mg total) by mouth 2 (two) times daily with a meal.   folic acid 1 MG tablet Commonly known as: FOLVITE Take 1 tablet (1 mg total) by mouth daily.   hydrALAZINE 100 MG tablet Commonly known as: APRESOLINE Take 1 tablet (100 mg total) by mouth every 8 (eight) hours.   insulin aspart 100 UNIT/ML FlexPen Commonly known as: NOVOLOG Insulin sliding scale: Blood sugar  120-150   3units                       151-200   4units                       201-250   7units                       251- 300  11units                       301-350   15uints                       351-400   20units                       >400         call MD immediately   Ipratropium-Albuterol 20-100 MCG/ACT Aers respimat Commonly known as: COMBIVENT Inhale 1 puff into the lungs every 6 (six) hours as needed for wheezing or shortness of breath.   metoCLOPramide 5 MG tablet Commonly known as: REGLAN Take 5 mg by mouth 4 (four) times daily as needed for nausea.   multivitamin with minerals Tabs tablet Take 1 tablet by mouth daily.   ondansetron 4 MG tablet Commonly known as: ZOFRAN Take 1 tablet (4 mg total) by mouth every 4 (four) hours as needed for nausea or vomiting.   pantoprazole 40 MG tablet Commonly known as: Protonix Take 1 tablet (40 mg total) by mouth 2 (two) times daily. What  changed: when to take this   polyethylene glycol 17 g packet Commonly known as: MIRALAX / GLYCOLAX Take 17 g by mouth daily. Start taking on:  June 01, 2022   sodium chloride 1 g tablet Take 1 tablet (1 g total) by mouth 2 (two) times daily with a meal for 3 days.   thiamine 100 MG tablet Commonly known as: Vitamin B-1 Take 1 tablet (100 mg total) by mouth daily.   traMADol 50 MG tablet Commonly known as: ULTRAM Take 1 tablet (50 mg total) by mouth every 6 (six) hours as needed. What changed: reasons to take this   TRUEplus 5-Bevel Pen Needles 32G X 4 MM Misc Generic drug: Insulin Pen Needle Use to inject basaglar once daily.   zinc sulfate 220 (50 Zn) MG capsule Take 1 capsule (220 mg total) by mouth daily.       Allergies  Allergen Reactions   Tomato Itching    Follow-up Information     Napoleon FormNandigam, Kavitha V, MD Follow up.   Specialty: Gastroenterology Why: for stomach pain, n/v and pancreatitis Contact information: [address] [place] [phone]                  The results of significant diagnostics from this hospitalization (including imaging, microbiology, ancillary and laboratory) are listed below for reference.    Significant Diagnostic Studies: CT ABDOMEN PELVIS W CONTRAST  Result Date: 05/26/2022 CLINICAL DATA:  Epigastric pain with history of pancreatitis. Hospitalized 1 week ago for similar complaints. EXAM: CT ABDOMEN AND PELVIS WITH CONTRAST TECHNIQUE: Multidetector CT imaging of the abdomen and pelvis was performed using the standard protocol following bolus administration of intravenous contrast. RADIATION DOSE REDUCTION: This exam was performed according to the departmental dose-optimization program which includes automated exposure control, adjustment of the mA and/or kV according to patient size and/or use of iterative reconstruction technique. CONTRAST:  100mL OMNIPAQUE IOHEXOL 300 MG/ML  SOLN COMPARISON:  Multiple prior CTs dating back to 2015. The 3 most recent are with contrast, dated 05/20/2022, 05/10/2022 and 03/27/2022 FINDINGS: Lower chest: No acute  abnormality. Hepatobiliary: No focal liver abnormality is seen. No calcified gallstones, gallbladder wall thickening, or biliary dilatation. Pancreas: No inflammatory changes, mass enhancement or ductal dilatation. Spleen: No abnormality. Adrenals/Urinary Tract: There is no adrenal mass. There is increased bilateral faint perinephric stranding, and could relate to fluid overload or inflammatory process. There are no areas of hypoenhancement in the kidneys typically seen with pyelonephritis. There is no urinary stone or obstruction. There is no bladder thickening. Stomach/Bowel: The stomach is contracted. No focal abnormality is seen. The unopacified small bowel is normal caliber. The infracecal appendix is normal. There is moderate retained stool. There is diverticulosis without evidence of diverticulitis. Vascular/Lymphatic: Very minimal aortic atherosclerosis. Scattered calcific plaque in the common iliac and internal iliac arteries. No AAA. No adenopathy. Reproductive: Prostate is unremarkable. Other: There is no free air, free hemorrhage, free fluid or incarcerated hernia. Musculoskeletal: No acute or significant osseous findings. IMPRESSION: 1. Increased faint perinephric stranding, which could be due to fluid overload or inflammatory process. There are no areas of hypoenhancement in the kidneys typically seen with pyelonephritis. There is no bladder thickening either. No urinary stones. 2. Constipation and diverticulosis. 3. Aortoiliac atherosclerosis. Electronically Signed   By: Almira BarKeith  Chesser M.D.   On: 05/26/2022 01:33   DG Chest Portable 1 View  Result Date: 05/26/2022 CLINICAL DATA:  Chest and abdomen pain. EXAM: PORTABLE CHEST 1 VIEW COMPARISON:  Portable chest 05/23/2022, portable chest 05/21/2022. FINDINGS: The heart size and mediastinal contours are within normal limits. Both lungs are clear. The visualized skeletal structures are unremarkable. There have been no significant interval changes.  IMPRESSION: No active disease.  Stable chest. Electronically Signed   By: Telford Nab M.D.   On: 05/26/2022 01:17   DG CHEST PORT 1 VIEW  Result Date: 05/23/2022 CLINICAL DATA:  Shortness of breath.  COVID infection. EXAM: PORTABLE CHEST 1 VIEW COMPARISON:  05/21/2022 FINDINGS: The lungs appear clear. Cardiac and mediastinal contours normal. No pleural effusion identified. IMPRESSION: 1.  No active cardiopulmonary disease is radiographically apparent. Electronically Signed   By: Van Clines M.D.   On: 05/23/2022 07:31   DG CHEST PORT 1 VIEW  Result Date: 05/21/2022 CLINICAL DATA:  Shortness of breath. EXAM: PORTABLE CHEST 1 VIEW COMPARISON:  05/19/2022 and older FINDINGS: The heart size and mediastinal contours are within normal limits. Both lungs are clear. The visualized skeletal structures are unremarkable. Overlapping cardiac leads. IMPRESSION: No acute cardiopulmonary disease. Electronically Signed   By: Jill Side M.D.   On: 05/21/2022 10:39   CT ABDOMEN PELVIS W CONTRAST  Result Date: 05/20/2022 CLINICAL DATA:  Abdominal pain, acute, nonlocalized. Nausea/vomiting, upper abd pain. Diagnosed with COVID. EXAM: CT ABDOMEN AND PELVIS WITH CONTRAST TECHNIQUE: Multidetector CT imaging of the abdomen and pelvis was performed using the standard protocol following bolus administration of intravenous contrast. RADIATION DOSE REDUCTION: This exam was performed according to the departmental dose-optimization program which includes automated exposure control, adjustment of the mA and/or kV according to patient size and/or use of iterative reconstruction technique. CONTRAST:  112mL OMNIPAQUE IOHEXOL 300 MG/ML  SOLN COMPARISON:  CT abdomen pelvis 05/10/2022 FINDINGS: Lower chest: No acute abnormality. Hepatobiliary: No focal liver abnormality. No gallstones, gallbladder wall thickening, or pericholecystic fluid. No biliary dilatation. Pancreas: No focal lesion. Normal pancreatic contour. No surrounding  inflammatory changes. No main pancreatic ductal dilatation. Spleen: Normal in size without focal abnormality. Adrenals/Urinary Tract: No adrenal nodule bilaterally. Bilateral kidneys enhance symmetrically. No hydronephrosis. No hydroureter. The urinary bladder is unremarkable. Stomach/Bowel: Stomach is within normal limits. No evidence of bowel wall thickening or dilatation. Few scattered colonic diverticula. Appendix appears normal. Vascular/Lymphatic: No abdominal aorta or iliac aneurysm. Mild atherosclerotic plaque of the aorta and its branches. No abdominal, pelvic, or inguinal lymphadenopathy. Reproductive: Prostate is unremarkable. Other: No intraperitoneal free fluid. No intraperitoneal free gas. No organized fluid collection. Musculoskeletal: No abdominal wall hernia or abnormality. No suspicious lytic or blastic osseous lesions. No acute displaced fracture. IMPRESSION: 1. No acute intra-abdominal or intrapelvic abnormality. 2.  Aortic Atherosclerosis (ICD10-I70.0). Electronically Signed   By: Iven Finn M.D.   On: 05/20/2022 00:38   DG Chest 2 View  Result Date: 05/19/2022 CLINICAL DATA:  Chest pain.  Body aches. EXAM: CHEST - 2 VIEW COMPARISON:  Chest radiographs dated January 03, 2022 FINDINGS: The heart size and mediastinal contours are within normal limits. Both lungs are clear. The visualized skeletal structures are unremarkable. IMPRESSION: No active cardiopulmonary disease. Electronically Signed   By: Keane Police D.O.   On: 05/19/2022 18:22   CT ABDOMEN PELVIS W CONTRAST  Result Date: 05/10/2022 CLINICAL DATA:  24 hours of lower abdominal pain and vomiting. EXAM: CT ABDOMEN AND PELVIS WITH CONTRAST TECHNIQUE: Multidetector CT imaging of the abdomen and pelvis was performed using the standard protocol following bolus administration of intravenous contrast. RADIATION DOSE REDUCTION: This exam was performed according to the departmental dose-optimization program which includes  automated  exposure control, adjustment of the mA and/or kV according to patient size and/or use of iterative reconstruction technique. CONTRAST:  OMNIPAQUE IOHEXOL 300 MG/ML  SOLN COMPARISON:  Multiple priors including CT March 27, 2022 FINDINGS: Lower chest: No acute abnormality. Similar mild symmetric wall thickening at the gastroesophageal junction. Hepatobiliary: No suspicious hepatic lesion. Probable adenomyomatosis of the gallbladder fundus. No evidence of gallbladder inflammation. No biliary ductal dilation. Pancreas: No pancreatic ductal dilation or evidence of acute inflammation. Spleen: No splenomegaly or focal splenic lesion. Adrenals/Urinary Tract: Bilateral adrenal glands appear normal. No hydronephrosis. Kidneys demonstrate symmetric enhancement. Urinary bladder is unremarkable for degree of distension. Stomach/Bowel: Wall thickening versus underdistention of the gastric cardia and antrum. No pathologic dilation of small or large bowel. The appendix and terminal ileum appear normal. Left-sided diverticulosis without findings of acute diverticulitis. Vascular/Lymphatic: Normal caliber abdominal aorta. No pathologically enlarged abdominal or pelvic lymph nodes. Reproductive: Enlarged prostate gland. Other: No significant abdominopelvic free fluid. Musculoskeletal: No acute osseous abnormality. IMPRESSION: 1. Wall thickening versus underdistention of the gastric cardia and antrum. Correlate for gastritis. 2. Mild symmetric wall thickening of the gastroesophageal junction may reflect sequela of emesis or esophagitis. 3. Left-sided diverticulosis without findings of acute diverticulitis. 4. Enlarged prostate gland. Electronically Signed   By: Maudry Mayhew M.D.   On: 05/10/2022 12:59    Microbiology: Recent Results (from the past 240 hour(s))  Culture, blood (Routine X 2) w Reflex to ID Panel     Status: None (Preliminary result)   Collection Time: 05/29/22  8:51 AM   Specimen: BLOOD  Result Value  Ref Range Status   Specimen Description   Final    BLOOD BLOOD RIGHT ARM Performed at [facility], [address], [place], [place] [address]    Special Requests   Final    BOTTLES DRAWN AEROBIC AND ANAEROBIC Blood Culture adequate volume Performed at [facility], [address], [place], [place] [address]    Culture   Final    NO GROWTH 2 DAYS Performed at [facility], [address] [place], [place] [address]    Report Status PENDING  Incomplete  Culture, blood (Routine X 2) w Reflex to ID Panel     Status: None (Preliminary result)   Collection Time: 05/29/22 10:09 AM   Specimen: BLOOD  Result Value Ref Range Status   Specimen Description   Final    BLOOD BLOOD LEFT ARM Performed at [facility], [address], [place], [place] [address]    Special Requests   Final    BOTTLES DRAWN AEROBIC ONLY Blood Culture adequate volume Performed at [facility], [address], [place], [place] [address]    Culture   Final    NO GROWTH 2 DAYS Performed at [facility], [address], [place], [place] [address]    Report Status PENDING  Incomplete     Labs: Basic Metabolic Panel: Recent Labs  Lab 05/27/22 2248 05/28/22 1100 05/29/22 0346 05/30/22 0342 05/31/22 0334  NA 123* 131* 130* 129* 131*  K 4.8 4.0 4.2 4.1 4.0  CL 95* 102 100 98 98  CO2 19* GLUCOSE 626* 217* 221* 255* 364*  BUN 38* 29* 27* 31* 34*  CREATININE 1.64* 1.30* 1.34* 1.49* 1.46*  CALCIUM 8.0* 8.3* 7.9* 7.8* 7.9*  MG 2.1  --   --   --   --   PHOS  --  3.6  --   --   --    Liver Function Tests: Recent Labs  Lab 05/25/22 2109 05/27/22 2248 05/29/22 0346 05/30/22 0342  AST 29 23 19 20   ALT 24 25 22 21   ALKPHOS 99 86 65 76  BILITOT 0.4 0.4 0.4 0.4  PROT 6.4* 6.4* 5.6* 5.6*  ALBUMIN 3.1* 3.0* 2.6* 2.5*   Recent Labs  Lab 05/25/22 2109 05/28/22 0358 05/29/22 0346 05/30/22 0342 05/31/22 0334   LIPASE 54* 54* 182* 76* 150*   No results for input(s): "AMMONIA" in the last 168 hours. CBC: Recent Labs  Lab 05/27/22 2248 05/28/22 1100 05/29/22 0346 05/30/22 0342 05/31/22 0334  WBC 13.6* 14.9* 13.6* 15.9* 16.4*  NEUTROABS 11.8*  --   --  11.8* 12.1*  HGB 10.5* 10.1* 9.9* 9.7* 9.4*  HCT 31.5* 30.5* 29.0* 28.5* 27.7*  MCV 83.1 82.7 81.0 81.2 82.4  PLT 283 207 312 285 256   Cardiac Enzymes: No results for input(s): "CKTOTAL", "CKMB", "CKMBINDEX", "TROPONINI" in the last 168 hours. BNP: BNP (last 3 results) Recent Labs    06/18/21 0606 05/21/22 0319  BNP 148.9* 110.9*    ProBNP (last 3 results) No results for input(s): "PROBNP" in the last 8760 hours.  CBG: Recent Labs  Lab 05/30/22 1221 05/30/22 1615 05/30/22 2152 05/31/22 0723 05/31/22 1216  GLUCAP 322* 290* 393* 316* 250*    FURTHER DISCHARGE INSTRUCTIONS:   Get Medicines reviewed and adjusted: Please take all your medications with you for your next visit with your Primary MD   Laboratory/radiological data: Please request your Primary MD to go over all hospital tests and procedure/radiological results at the follow up, please ask your Primary MD to get all Hospital records sent to his/her office.   In some cases, they will be blood work, cultures and biopsy results pending at the time of your discharge. Please request that your primary care M.D. goes through all the records of your hospital data and follows up on these results.   Also Note the following: If you experience worsening of your admission symptoms, develop shortness of breath, life threatening emergency, suicidal or homicidal thoughts you must seek medical attention immediately by calling 911 or calling your MD immediately  if symptoms less severe.   You must read complete instructions/literature along with all the possible adverse reactions/side effects for all the Medicines you take and that have been prescribed to you. Take any new Medicines  after you have completely understood and accpet all the possible adverse reactions/side effects.    Do not drive when taking Pain medications or sleeping medications (Benzodaizepines)   Do not take more than prescribed Pain, Sleep and Anxiety Medications. It is not advisable to combine anxiety,sleep and pain medications without talking with your primary care practitioner   Special Instructions: If you have smoked or chewed Tobacco  in the last 2 yrs please stop smoking, stop any regular Alcohol  and or any Recreational drug use.   Wear Seat belts while driving.   Please note: You were cared for by a hospitalist during your hospital stay. Once you are discharged, your primary care physician will handle any further medical issues. Please note that NO REFILLS for any discharge medications will be authorized once you are discharged, as it is imperative that you return to your primary care physician (or establish a relationship with a primary care physician if you do not have one) for your post hospital discharge needs so that they can reassess your need for  medications and monitor your lab values.     Signed:  Albertine GratesFang Trevaun Rendleman MD, PhD, FACP  [facility] 05/31/2022, 1:49 PM

## 2022-06-03 ENCOUNTER — Ambulatory Visit (HOSPITAL_COMMUNITY)
Admission: EM | Admit: 2022-06-03 | Discharge: 2022-06-03 | Disposition: A | Discharge status: Home / Self Care | Payer: BLUE CROSS/BLUE SHIELD | Attending: Internal Medicine | Admitting: Internal Medicine

## 2022-06-03 ENCOUNTER — Encounter (HOSPITAL_COMMUNITY): Payer: Self-pay

## 2022-06-03 ENCOUNTER — Telehealth: Payer: Self-pay

## 2022-06-03 DIAGNOSIS — R21 Rash and other nonspecific skin eruption: Secondary | ICD-10-CM

## 2022-06-03 LAB — CULTURE, BLOOD (ROUTINE X 2)
Culture: NO GROWTH
Culture: NO GROWTH
Special Requests: ADEQUATE
Special Requests: ADEQUATE

## 2022-06-03 MED ORDER — DOXYCYCLINE HYCLATE 100 MG PO CAPS: 100.0000 mg | ORAL_CAPSULE | Freq: Two times a day (BID) | ORAL | 0 refills | Status: AC

## 2022-06-03 NOTE — Telephone Encounter (Signed)
Transferring to [place], [place] Nurse Health Advisor Direct Dial [phone]

## 2022-06-03 NOTE — ED Provider Notes (Signed)
[place]    CSN: [id number] Arrival date & time: 06/03/22  1513      History   Chief Complaint Chief Complaint  Patient presents with   Rash    HPI Randy [name]. is a 42 y.o. male comes to the urgent care with a 2-day history of papular rash over the upper extremity and upper chest.  Patient says the rash started a couple of days ago and has spread to involve the areas referenced above.  Rashes not itchy.  He denies changes in soaps, cosmetics or body lotions.  Patient denies swimming in the pool or outside.  No fever or chills.  Patient recovered from COVID-19 infection about 3 weeks ago.  He denies any shortness of breath, wheezing, cough or sputum production. HPI  Past Medical History:  Diagnosis Date   Essential hypertension 05/31/2019   HLD (hyperlipidemia)    Pancreatitis    Type II diabetes mellitus ([place])     Patient Active Problem List   Diagnosis Date Noted   Uncontrolled type 2 diabetes mellitus with hyperglycemia ([place]) 05/28/2022   Leukocytosis 05/28/2022   Shortness of breath 05/21/2022   COVID-19 virus infection 05/20/2022   Hypomagnesemia 05/20/2022   COVID-19 05/20/2022   Anemia due to chronic kidney disease 03/28/2022   Stage 3a chronic kidney disease (CKD) ([place]) 03/27/2022   Obesity (BMI 30-39.9) 01/06/2022   Hyponatremia 01/04/2022   Acute on chronic pancreatitis ([place]) 01/04/2022   Abdominal pain 09/12/2021   Fall at home, initial encounter 06/18/2021   Acute pancreatitis 09/17/2020   Abnormal urinalysis 09/17/2020   Intractable nausea and vomiting 09/17/2020   Hypertensive crisis 09/12/2020   AKI (acute kidney injury) ([place]) 09/12/2020   GERD (gastroesophageal reflux disease) 09/12/2020   DM2 (diabetes mellitus, type 2) ([place])    HLD (hyperlipidemia) 06/01/2019   Microalbuminuria due to type 2 diabetes mellitus ([place]) 06/01/2019   Essential hypertension 05/31/2019   Hypertensive urgency 08/31/2015   Type 2 diabetes mellitus  with hyperglycemia, with long-term current use of insulin ([place]) 08/31/2015   Cannabis abuse 08/31/2015    Past Surgical History:  Procedure Laterality Date   NO PAST SURGERIES         Home Medications    Prior to Admission medications   Medication Sig Start Date End Date Taking? Authorizing Provider  amLODipine (NORVASC) 5 MG tablet Take 2 tablets (10 mg total) by mouth daily. Patient taking differently: Take 5 mg by mouth in the morning and at bedtime. 05/12/22 07/11/22 Yes Charlynne Cousins, MD  atorvastatin (LIPITOR) 40 MG tablet Take 2 tablets (80 mg total) by mouth daily. 05/12/22 09/09/22 Yes Charlynne Cousins, MD  carvedilol (COREG) 25 MG tablet Take 1 tablet (25 mg total) by mouth 2 (two) times daily with a meal. 05/12/22 09/09/22 Yes Charlynne Cousins, MD  doxycycline (VIBRAMYCIN) 100 MG capsule Take 1 capsule (100 mg total) by mouth 2 (two) times daily for 7 days. 06/03/22 06/10/22 Yes Verba Ainley, Myrene Galas, MD  folic acid (FOLVITE) 1 MG tablet Take 1 tablet (1 mg total) by mouth daily. 05/24/22  Yes Sheikh, Omair Latif, DO  glucose blood (ACCU-CHEK GUIDE) test strip Use to check blood sugar 3 times daily. 10/15/21  Yes Charlott Rakes, MD  hydrALAZINE (APRESOLINE) 100 MG tablet Take 1 tablet (100 mg total) by mouth every 8 (eight) hours. 05/12/22 07/11/22 Yes Charlynne Cousins, MD  insulin aspart (NOVOLOG) 100 UNIT/ML FlexPen Insulin sliding scale: Blood sugar  120-150   3units  151-200   4units                       201-250   7units                       251- 300  11units                       301-350   15uints                       351-400   20units                       >400         call MD immediately 05/31/22  Yes Florencia Reasons, MD  Insulin Glargine [facility]) 100 UNIT/ML Inject 18-35 Units into the skin See admin instructions. Inject 18-35 units into the skin, per sliding scale 05/31/22 07/30/22 Yes Florencia Reasons, MD  Insulin Pen Needle (TRUEPLUS  5-BEVEL PEN NEEDLES) 32G X 4 MM MISC Use to inject basaglar once daily. 01/30/22  Yes Minette Brine, Amy J, NP  metoCLOPramide (REGLAN) 5 MG tablet Take 5 mg by mouth 4 (four) times daily as needed for nausea.   Yes [provider]  Multiple Vitamin (MULTIVITAMIN WITH MINERALS) TABS tablet Take 1 tablet by mouth daily. 05/24/22  Yes Sheikh, Omair Latif, DO  ondansetron (ZOFRAN) 4 MG tablet Take 1 tablet (4 mg total) by mouth every 4 (four) hours as needed for nausea or vomiting. 05/26/22  Yes Wynona Dove A, DO  pantoprazole (PROTONIX) 40 MG tablet Take 1 tablet (40 mg total) by mouth 2 (two) times daily. Patient taking differently: Take 40 mg by mouth daily before breakfast. 05/12/22 05/12/23 Yes Charlynne Cousins, MD  polyethylene glycol (MIRALAX / GLYCOLAX) 17 g packet Take 17 g by mouth daily. 06/01/22  Yes Florencia Reasons, MD  traMADol (ULTRAM) 50 MG tablet Take 1 tablet (50 mg total) by mouth every 6 (six) hours as needed. Patient taking differently: Take 50 mg by mouth every 6 (six) hours as needed (for pain). 05/23/22  Yes Sheikh, Omair Latif, DO  zinc sulfate 220 (50 Zn) MG capsule Take 1 capsule (220 mg total) by mouth daily. 05/24/22  Yes Sheikh, Omair Latif, DO  acetaminophen (TYLENOL) 325 MG tablet Take 2 tablets (650 mg total) by mouth every 6 (six) hours as needed for mild pain (or Fever >/= 101). 05/23/22   Sheikh, Omair Latif, DO  Ipratropium-Albuterol (COMBIVENT) 20-100 MCG/ACT AERS respimat Inhale 1 puff into the lungs every 6 (six) hours as needed for wheezing or shortness of breath. 05/23/22   Raiford Noble Latif, DO  thiamine (VITAMIN B-1) 100 MG tablet Take 1 tablet (100 mg total) by mouth daily. Patient not taking: Reported on 06/03/2022 05/24/22   Kerney Elbe, DO    Family History Family History  Problem Relation Age of Onset   Hypertension Mother    Diabetes Father    Hypertension Father    Diabetes Brother    Diabetes Maternal Grandmother    Colon cancer Neg Hx     Esophageal cancer Neg Hx    Rectal cancer Neg Hx    Stomach cancer Neg Hx     Social History Social History   Tobacco Use   Smoking status: Former    Packs/day: 0.33    Years: 17.00    Total  pack years: 5.61    Types: Cigarettes    Quit date: 12/26/2011    Years since quitting: 10.4    Passive exposure: Past   Smokeless tobacco: Never  Vaping Use   Vaping Use: Never used  Substance Use Topics   Alcohol use: No    Comment: no alcohol since age 46   Drug use: Yes    Types: Marijuana    Comment: regular use     Allergies   Tomato   Review of Systems Review of Systems  Musculoskeletal: Negative.   Skin:  Positive for color change and rash.     Physical Exam Triage Vital Signs ED Triage Vitals  Enc Vitals Group     BP 06/03/22 1659 (!) 147/85     Pulse Rate 06/03/22 1659 73     Resp 06/03/22 1659 16     Temp 06/03/22 1659 98.3 F (36.8 C)     Temp Source 06/03/22 1659 Oral     SpO2 06/03/22 1659 98 %     Weight --      Height --      Head Circumference --      Peak Flow --      Pain Score 06/03/22 1651 0     Pain Loc --      Pain Edu? --      Excl. in GC? --    No data found.  Updated Vital Signs BP (!) 147/85 (BP Location: Right Arm)   Pulse 73   Temp 98.3 F (36.8 C) (Oral)   Resp 16   SpO2 98%   Visual Acuity Right Eye Distance:   Left Eye Distance:   Bilateral Distance:    Right Eye Near:   Left Eye Near:    Bilateral Near:     Physical Exam Vitals and nursing note reviewed.  Constitutional:      Appearance: Normal appearance.  Cardiovascular:     Rate and Rhythm: Normal rate and regular rhythm.     Pulses: Normal pulses.     Heart sounds: Normal heart sounds. No murmur heard. Musculoskeletal:        General: Normal range of motion.  Skin:    Comments: Papular rash over the upper extremities and upper chest and upper back.  The rash is papular with surrounding erythema.  Some of the rash is pustular in nature.  Neurological:      Mental Status: He is alert.      UC Treatments / Results  Labs (all labs ordered are listed, but only abnormal results are displayed) Labs Reviewed - No data to display  EKG   Radiology No results found.  Procedures Procedures (including critical care time)  Medications Ordered in UC Medications - No data to display  Initial Impression / Assessment and Plan / UC Course  I have reviewed the triage vital signs and the nursing notes.  Pertinent labs & imaging results that were available during my care of the patient were reviewed by me and considered in my medical decision making (see chart for details).     1.  Pustular rash (suspected folliculitis): Doxycycline 100 mg twice daily for 7 days This rash is less likely exanthem associated with COVID-19 infection Return precautions given  Final Clinical Impressions(s) / UC Diagnoses   Final diagnoses:  Rash and nonspecific skin eruption     Discharge Instructions      Please take medications as prescribed This is likely infection of your hair follicles.  It is possible that this is related to the recent COVID-19 infection Keep your skin moisturized If you have worsening symptoms please return to urgent care to be reevaluated.   ED Prescriptions     Medication Sig Dispense Auth. Provider   doxycycline (VIBRAMYCIN) 100 MG capsule Take 1 capsule (100 mg total) by mouth 2 (two) times daily for 7 days. 14 capsule Winifred Balogh, Myrene Galas, MD      PDMP not reviewed this encounter.   Chase Picket, MD 06/03/22 [phone]

## 2022-06-03 NOTE — ED Triage Notes (Signed)
Pt is here for a rash all over body  x2days

## 2022-06-03 NOTE — Discharge Instructions (Addendum)
Please take medications as prescribed This is likely infection of your hair follicles.  It is possible that this is related to the recent COVID-19 infection Keep your skin moisturized If you have worsening symptoms please return to urgent care to be reevaluated.

## 2022-06-24 ENCOUNTER — Ambulatory Visit (INDEPENDENT_AMBULATORY_CARE_PROVIDER_SITE_OTHER): Payer: BLUE CROSS/BLUE SHIELD | Admitting: Family Medicine

## 2022-06-24 VITALS — BP 144/82 | HR 85 | Temp 98.3°F | Resp 16 | Ht 68.0 in | Wt 193.0 lb

## 2022-06-24 DIAGNOSIS — Z794 Long term (current) use of insulin: Secondary | ICD-10-CM | POA: Diagnosis not present

## 2022-06-24 DIAGNOSIS — E113552 Type 2 diabetes mellitus with stable proliferative diabetic retinopathy, left eye: Secondary | ICD-10-CM | POA: Diagnosis not present

## 2022-06-24 LAB — POCT GLYCOSYLATED HEMOGLOBIN (HGB A1C): Hemoglobin A1C: 9.8 % — AB (ref 4.0–5.6)

## 2022-06-24 MED ORDER — AMLODIPINE BESYLATE 10 MG PO TABS: 10.0000 mg | ORAL_TABLET | Freq: Every day | ORAL | 1 refills | Status: DC

## 2022-06-24 NOTE — Progress Notes (Unsigned)
Patient is here for their 6 month follow-up Patient has no concerns today Care gaps have been discussed with patient  
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
## 2022-06-25 ENCOUNTER — Encounter: Payer: Self-pay | Admitting: Family Medicine

## 2022-06-27 LAB — MICROALBUMIN / CREATININE URINE RATIO
Creatinine, Urine: 141.1 mg/dL
Microalb/Creat Ratio: 350 mg/g creat — ABNORMAL HIGH (ref 0–29)
Microalbumin, Urine: 494 ug/mL

## 2022-07-04 ENCOUNTER — Telehealth: Payer: Self-pay

## 2022-07-04 NOTE — Telephone Encounter (Signed)
Patient attempted to be outreached by Junius Finner, PharmD Candidate on 07/04/2022 to discuss hypertension. Left voicemail for patient to return our call at their convenience at [phone].   [facility] [facility]  PharmD Candidate 2024   Maryan Puls, PharmD PGY-1 [facility] Pharmacy Resident

## 2022-07-17 NOTE — Progress Notes (Signed)
S:     PCP: Dr. Redmond Pulling  42 y.o. male who presents for diabetes evaluation, education, and management.  PMH is significant for HTN, T2DM, HLD, GERD, legally blind and pancreatitis.   Patient was referred and last seen by Primary Care Provider, Dr. Redmond Pulling on 06/24/2022.At last visit, he presented to establish care. BP and A1c were elevated at 144/82 mmHg and 9.8%. Amlodipine was initiated and he was referred to CPP for DM management as his cataract surgery is deferred until controlled.  Today, patient arrives in good spirits and presents with his mother.  Family/Social History:  -Dhx: HTN, DM -Tobacco:former (quit 2013) -Alcohol: denies -Illicit drug use: denies  Current diabetes medications include: Basaglar 18-35 units once daily (35 units at night), Novolog per sliding scale (taking 8-9 units) Current hypertension medications include: carvedilol 25 mg BID, amlodipine 10 mg once daily, hydralazine 100 mg TID Current hyperlipidemia medications include: atorvastatin 40 mg 2 tabs once daily  Patient reports missing 2 doses of Novolog once weekly. Reports adherence to his other medications at the doses listed above. He states that he does not have any test strips to check his BG at home. Because of this, he has been administering 8-9 units of Novolog with his meals.   Insurance coverage: BCBS  Patient denies hypoglycemic events.  Reported home fasting blood sugars: not checking  Patient reports nocturia (nighttime urination), more than usual.  Patient reports neuropathy (nerve pain). Patient reports visual changes. Patient denies self foot exams.   Patient reported dietary habits:  - mostly baked foods and green vegetables   Patient-reported exercise habits:  -none reported   O:  7 day average blood glucose: not checking   Lab Results  Component Value Date   HGBA1C 9.8 (A) 06/24/2022   There were no vitals filed for this visit.  Lipid Panel     Component Value  Date/Time   CHOL 313 (H) 06/21/2021 0436   CHOL 322 (H) 05/31/2019 1001   TRIG 229 (H) 06/21/2021 0436   HDL 32 (L) 06/21/2021 0436   HDL 38 (L) 05/31/2019 1001   CHOLHDL 9.8 06/21/2021 0436   VLDL 46 (H) 06/21/2021 0436   LDLCALC 235 (H) 06/21/2021 0436   LDLCALC 196 (H) 05/31/2019 1001   LDLDIRECT 137 (H) 09/01/2019 1115    Clinical Atherosclerotic Cardiovascular Disease (ASCVD): No  The 10-year ASCVD risk score (Arnett DK, et al., 2019) is: 18%   Values used to calculate the score:     Age: 71 years     Sex: Male     Is Non-Hispanic African American: Yes     Diabetic: Yes     Tobacco smoker: No     Systolic Blood Pressure: 123456 mmHg     Is BP treated: Yes     HDL Cholesterol: 32 mg/dL     Total Cholesterol: 313 mg/dL    A/P: Diabetes longstanding currently uncontrolled based on most recent A1c. Patient is able to verbalize appropriate hypoglycemia management plan. Medication adherence appears appropriate. Because he is not currently checking his BG, will have to continue his insulin at current home doses. Can consider dose increase at next visit once we have more readings.  -Continued basal insulin Basaglar at 35 units once daily -Continued rapid insulin Novolog (insulin aspart) at 8 units TID with meals - Initiated Freestyle Libre CGM.  - Sent in new Rx for meter, test strips, and lancing device -Patient educated on purpose, proper use, and potential adverse effects of  insulin.  -Extensively discussed pathophysiology of diabetes, recommended lifestyle interventions, dietary effects on blood sugar control.  -Counseled on s/sx of and management of hypoglycemia.  -Next A1c anticipated 09/2022.   ASCVD risk - primary  prevention in patient with diabetes. Last LDL is 235 not at goal of <70 mg/dL. high intensity statin indicated.  -Continued atorvastatin 80 mg. Changed from two 40 mg tablets once daily to one 80 mg tablet once daily.   Written patient instructions provided.  Patient verbalized understanding of treatment plan.  Total time in face to face counseling 30 minutes.    Follow-up:  Pharmacist in one month.  Maryan Puls, PharmD PGY-1 [facility] Pharmacy Resident

## 2022-07-18 ENCOUNTER — Other Ambulatory Visit: Payer: Self-pay

## 2022-07-18 ENCOUNTER — Ambulatory Visit: Payer: 59 | Admitting: Pharmacist

## 2022-07-18 ENCOUNTER — Ambulatory Visit: Payer: 59 | Attending: Family Medicine | Admitting: Pharmacist

## 2022-07-18 ENCOUNTER — Encounter: Payer: Self-pay | Admitting: Pharmacist

## 2022-07-18 DIAGNOSIS — Z794 Long term (current) use of insulin: Secondary | ICD-10-CM

## 2022-07-18 DIAGNOSIS — E1165 Type 2 diabetes mellitus with hyperglycemia: Secondary | ICD-10-CM | POA: Diagnosis not present

## 2022-07-18 MED ORDER — ATORVASTATIN CALCIUM 80 MG PO TABS
80.0000 mg | ORAL_TABLET | Freq: Every day | ORAL | 1 refills | Status: DC
  Filled 2022-07-18: qty 30, 30d supply, fill #0
  Filled 2022-08-09: qty 90, 90d supply, fill #0
  Filled 2022-10-10: qty 30, 30d supply, fill #0

## 2022-07-18 MED ORDER — FREESTYLE LIBRE 2 READER DEVI
0 refills | Status: DC
  Filled 2022-07-18: qty 1, fill #0
  Filled 2022-08-09 – 2023-03-24 (×2): qty 1, 30d supply, fill #0

## 2022-07-18 MED ORDER — ONETOUCH VERIO VI STRP
ORAL_STRIP | 3 refills | Status: DC
  Filled 2022-07-18: qty 100, fill #0

## 2022-07-18 MED ORDER — ONETOUCH VERIO W/DEVICE KIT
PACK | 0 refills | Status: DC
  Filled 2022-07-18: qty 1, fill #0

## 2022-07-18 MED ORDER — ONETOUCH DELICA LANCETS 33G MISC
3 refills | Status: DC
  Filled 2022-07-18: qty 100, fill #0

## 2022-07-18 MED ORDER — INSULIN ASPART 100 UNIT/ML FLEXPEN
8.0000 [IU] | PEN_INJECTOR | Freq: Three times a day (TID) | SUBCUTANEOUS | 2 refills | Status: DC
  Filled 2022-07-18: qty 15, fill #0

## 2022-07-18 MED ORDER — FREESTYLE LIBRE 2 SENSOR MISC
6 refills | Status: DC
  Filled 2022-07-18: qty 2, fill #0
  Filled 2022-08-09 – 2023-03-24 (×2): qty 2, 28d supply, fill #0
  Filled 2023-04-15 – 2023-05-30 (×4): qty 2, 28d supply, fill #1

## 2022-07-18 MED ORDER — BASAGLAR KWIKPEN 100 UNIT/ML ~~LOC~~ SOPN
35.0000 [IU] | PEN_INJECTOR | Freq: Every day | SUBCUTANEOUS | 2 refills | Status: DC
  Filled 2022-07-18: qty 15, 42d supply, fill #0

## 2022-07-25 ENCOUNTER — Other Ambulatory Visit: Payer: Self-pay

## 2022-08-09 ENCOUNTER — Other Ambulatory Visit: Payer: Self-pay | Admitting: Family Medicine

## 2022-08-09 ENCOUNTER — Other Ambulatory Visit: Payer: Self-pay

## 2022-08-09 DIAGNOSIS — N529 Male erectile dysfunction, unspecified: Secondary | ICD-10-CM

## 2022-08-12 ENCOUNTER — Other Ambulatory Visit: Payer: Self-pay

## 2022-08-16 ENCOUNTER — Other Ambulatory Visit: Payer: Self-pay

## 2022-08-23 ENCOUNTER — Other Ambulatory Visit: Payer: Self-pay

## 2022-09-09 ENCOUNTER — Other Ambulatory Visit: Payer: Self-pay

## 2022-09-09 ENCOUNTER — Ambulatory Visit: Payer: 59 | Attending: Family Medicine | Admitting: Pharmacist

## 2022-09-09 DIAGNOSIS — E785 Hyperlipidemia, unspecified: Secondary | ICD-10-CM | POA: Diagnosis not present

## 2022-09-09 DIAGNOSIS — I10 Essential (primary) hypertension: Secondary | ICD-10-CM | POA: Diagnosis not present

## 2022-09-09 DIAGNOSIS — Z794 Long term (current) use of insulin: Secondary | ICD-10-CM

## 2022-09-09 DIAGNOSIS — E1165 Type 2 diabetes mellitus with hyperglycemia: Secondary | ICD-10-CM | POA: Diagnosis not present

## 2022-09-09 MED ORDER — BASAGLAR KWIKPEN 100 UNIT/ML ~~LOC~~ SOPN
34.0000 [IU] | PEN_INJECTOR | Freq: Every day | SUBCUTANEOUS | 2 refills | Status: DC
Start: 2022-09-09 — End: 2022-10-25
  Filled 2022-09-09 – 2022-10-10 (×2): qty 9, 26d supply, fill #0

## 2022-09-09 MED ORDER — INSULIN ASPART 100 UNIT/ML FLEXPEN
10.0000 [IU] | PEN_INJECTOR | Freq: Three times a day (TID) | SUBCUTANEOUS | 2 refills | Status: DC
  Filled 2022-09-09 – 2022-10-10 (×2): qty 9, 30d supply, fill #0

## 2022-09-09 MED ORDER — EZETIMIBE 10 MG PO TABS
10.0000 mg | ORAL_TABLET | Freq: Every day | ORAL | 2 refills | Status: DC
  Filled 2022-09-09: qty 30, 30d supply, fill #0

## 2022-09-09 MED ORDER — AMLODIPINE BESYLATE 10 MG PO TABS
10.0000 mg | ORAL_TABLET | Freq: Every day | ORAL | 2 refills | Status: DC
  Filled 2022-09-09 – 2022-10-10 (×2): qty 30, 30d supply, fill #0

## 2022-09-09 MED ORDER — CARVEDILOL 25 MG PO TABS
25.0000 mg | ORAL_TABLET | Freq: Two times a day (BID) | ORAL | 2 refills | Status: DC
  Filled 2022-09-09 – 2022-10-10 (×2): qty 60, 30d supply, fill #0

## 2022-09-09 MED ORDER — TRUEPLUS 5-BEVEL PEN NEEDLES 32G X 4 MM MISC
2 refills | Status: DC
Start: 2022-09-09 — End: 2022-11-11
  Filled 2022-09-09 – 2022-11-11 (×2): qty 100, 100d supply, fill #0
  Filled 2022-11-11: qty 100, 25d supply, fill #0

## 2022-09-09 MED ORDER — PANTOPRAZOLE SODIUM 40 MG PO TBEC
40.0000 mg | DELAYED_RELEASE_TABLET | Freq: Two times a day (BID) | ORAL | 1 refills | Status: DC
  Filled 2022-09-09: qty 60, 30d supply, fill #0

## 2022-09-09 MED ORDER — HYDRALAZINE HCL 100 MG PO TABS
100.0000 mg | ORAL_TABLET | Freq: Three times a day (TID) | ORAL | 2 refills | Status: DC
  Filled 2022-09-09 – 2022-10-10 (×2): qty 90, 30d supply, fill #0

## 2022-09-09 MED ORDER — BASAGLAR KWIKPEN 100 UNIT/ML ~~LOC~~ SOPN
36.0000 [IU] | PEN_INJECTOR | Freq: Every day | SUBCUTANEOUS | 2 refills | Status: DC
Start: 2022-09-09 — End: 2022-09-09
  Filled 2022-09-09: qty 15, 41d supply, fill #0

## 2022-09-09 NOTE — Progress Notes (Unsigned)
S:     No chief complaint on file.  42 y.o. male who presents for diabetes evaluation, education, and management.  PMH is significant for HTN, T2DM, HLD, GERD, legally blind and pancreatitis.  Patient was referred and last seen by Primary Care Provider, Dr. Andrey Campanile on 06/24/2022.At last visit, he presented to establish care. BP and A1c were elevated at 144/82 mmHg and 9.8%. Amlodipine was initiated and he was referred to CPP for DM management as his cataract surgery is deferred until controlled. Seen by pharmacy clinic on 07/18/2022 and patient reported missing 2 doses of Novolog weekly. At that visit, continued Basaglar 35 units daily and Novolog 8 units TID with meals. Since then patient was hospitalized in [place] for abdominal pain. Patient reports abdominal pain was likely due to dietary indiscretion.   Today, patient arrives in good spirits and presents with family member. Patient reports taking Basaglar 30 units 3-4 nights out of the week and Novolog 10-12 units with breakfast and meals. Patient was unable to pick the [facility] due to cost and has not been checking his blood sugars at home regularly.   Patient reports Diabetes longstanding.   Family/Social History:  -Dhx: HTN, DM -Tobacco:former (quit 2013) -Alcohol: denies -Illicit drug use: denies  Current diabetes medications include: Basaglar 35 units at night (reports taking 30 units), Novolog 8 units TID with meals (reports taking 10-12 units) Current hypertension medications include: carvedilol 25 mg BID, amlodipine 10 mg once daily, hydralazine 100 mg TID  Current hyperlipidemia medications include: atorvastatin 80 mg daily  Patient reports adherence to taking all medications as prescribed. Except insulin, see above.  Family member assists with medications.   Patient reports hypoglycemic events. One event after taking 20 units Novolog.   Reported home fasting blood sugars: 90-300s   Patient reports nocturia  (nighttime urination).  Patient reports increased thirst.  Patient denies neuropathy (nerve pain). Patient reports visual changes. Patient reports self foot exams.   Patient reported dietary habits: Eats 2 meals/day Rice and baked chicken Drinks: Gatorade zero, tea and alkaline water with sodium  O:   Lab Results  Component Value Date   HGBA1C 9.8 (A) 06/24/2022   There were no vitals filed for this visit.  Lipid Panel     Component Value Date/Time   CHOL 313 (H) 06/21/2021 0436   CHOL 322 (H) 05/31/2019 1001   TRIG 229 (H) 06/21/2021 0436   HDL 32 (L) 06/21/2021 0436   HDL 38 (L) 05/31/2019 1001   CHOLHDL 9.8 06/21/2021 0436   VLDL 46 (H) 06/21/2021 0436   LDLCALC 235 (H) 06/21/2021 0436   LDLCALC 196 (H) 05/31/2019 1001   LDLDIRECT 137 (H) 09/01/2019 1115   Clinical Atherosclerotic Cardiovascular Disease (ASCVD): No  The 10-year ASCVD risk score (Arnett DK, et al., 2019) is: 40.8%   Values used to calculate the score:     Age: 42 years     Sex: Male     Is Non-Hispanic African American: Yes     Diabetic: Yes     Tobacco smoker: Yes     Systolic Blood Pressure: 186 mmHg     Is BP treated: Yes     HDL Cholesterol: 31 mg/dL     Total Cholesterol: 242 mg/dL   A/P: Diabetes longstanding currently uncontrolled based on A1c. Patient is able to verbalize appropriate hypoglycemia management plan. Medication adherence appears suboptimal based on insulin dosing and refill records.  -Increased dose of basal insulin /Basaglar (insulin glargine) from  30 units to 34 units daily in the morning.  -Increased dose of rapid insulin Novolog (insulin aspart) from 10-12 units before meals to 10 units before each meal.  -Extensively discussed pathophysiology of diabetes, recommended lifestyle interventions, dietary effects on blood sugar control.  -Counseled on s/sx of and management of hypoglycemia.  -Next A1c anticipated May 2024.   ASCVD risk - primary prevention in patient with  diabetes. Last LDL is 175 not at goal of <25 mg/dL. high intensity statin indicated.  -Start Zetia 10 mg daily. -Continued atorvastatin 80 mg.   Written patient instructions provided. Patient verbalized understanding of treatment plan.  Total time in face to face counseling 30 minutes.    Follow-up:  PCP clinic visit in 1 month.   Georga Hacking, Pharm.D PGY1 Pharmacy Resident 09/09/2022 4:27 PM

## 2022-09-10 ENCOUNTER — Other Ambulatory Visit: Payer: Self-pay

## 2022-09-12 ENCOUNTER — Telehealth: Payer: Self-pay

## 2022-09-12 NOTE — Telephone Encounter (Signed)
A prior authorization request for generic Zetia has been submitted to insurance today via CoverMyMeds Key: [facility]

## 2022-09-13 ENCOUNTER — Other Ambulatory Visit: Payer: Self-pay

## 2022-09-16 ENCOUNTER — Other Ambulatory Visit: Payer: Self-pay

## 2022-09-18 NOTE — Telephone Encounter (Signed)
approved

## 2022-09-21 ENCOUNTER — Other Ambulatory Visit: Payer: Self-pay

## 2022-09-21 ENCOUNTER — Encounter (HOSPITAL_COMMUNITY): Payer: Self-pay

## 2022-09-21 ENCOUNTER — Emergency Department (HOSPITAL_COMMUNITY)
Admission: EM | Admit: 2022-09-21 | Discharge: 2022-09-21 | Disposition: A | Discharge status: Home / Self Care | Payer: 59 | Attending: Emergency Medicine | Admitting: Emergency Medicine

## 2022-09-21 ENCOUNTER — Emergency Department (HOSPITAL_COMMUNITY): Payer: 59

## 2022-09-21 DIAGNOSIS — R1013 Epigastric pain: Secondary | ICD-10-CM | POA: Insufficient documentation

## 2022-09-21 DIAGNOSIS — E119 Type 2 diabetes mellitus without complications: Secondary | ICD-10-CM | POA: Insufficient documentation

## 2022-09-21 DIAGNOSIS — Z794 Long term (current) use of insulin: Secondary | ICD-10-CM | POA: Insufficient documentation

## 2022-09-21 DIAGNOSIS — G8929 Other chronic pain: Secondary | ICD-10-CM | POA: Insufficient documentation

## 2022-09-21 DIAGNOSIS — I10 Essential (primary) hypertension: Secondary | ICD-10-CM | POA: Insufficient documentation

## 2022-09-21 DIAGNOSIS — Z79899 Other long term (current) drug therapy: Secondary | ICD-10-CM | POA: Diagnosis not present

## 2022-09-21 DIAGNOSIS — R55 Syncope and collapse: Secondary | ICD-10-CM | POA: Insufficient documentation

## 2022-09-21 LAB — TROPONIN I (HIGH SENSITIVITY)
Troponin I (High Sensitivity): 11 ng/L (ref <18)
Troponin I (High Sensitivity): 14 ng/L (ref <18)

## 2022-09-21 LAB — CBC
HCT: 37 % — ABNORMAL LOW (ref 39.0–52.0)
Hemoglobin: 11.9 g/dL — ABNORMAL LOW (ref 13.0–17.0)
MCH: 27.2 pg (ref 26.0–34.0)
MCHC: 32.2 g/dL (ref 30.0–36.0)
MCV: 84.5 fL (ref 80.0–100.0)
Platelets: 352 10*3/uL (ref 150–400)
RBC: 4.38 MIL/uL (ref 4.22–5.81)
RDW: 12.5 % (ref 11.5–15.5)
WBC: 11.8 10*3/uL — ABNORMAL HIGH (ref 4.0–10.5)
nRBC: 0 % (ref 0.0–0.2)

## 2022-09-21 LAB — COMPREHENSIVE METABOLIC PANEL
ALT: 14 U/L (ref 0–44)
AST: 23 U/L (ref 15–41)
Albumin: 4 g/dL (ref 3.5–5.0)
Alkaline Phosphatase: 79 U/L (ref 38–126)
Anion gap: 11 (ref 5–15)
BUN: 17 mg/dL (ref 6–20)
CO2: 20 mmol/L — ABNORMAL LOW (ref 22–32)
Calcium: 9 mg/dL (ref 8.9–10.3)
Chloride: 103 mmol/L (ref 98–111)
Creatinine, Ser: 1.39 mg/dL — ABNORMAL HIGH (ref 0.61–1.24)
GFR, Estimated: >60 mL/min (ref >60)
Glucose, Bld: 81 mg/dL (ref 70–99)
Potassium: 4 mmol/L (ref 3.5–5.1)
Sodium: 134 mmol/L — ABNORMAL LOW (ref 135–145)
Total Bilirubin: 1.1 mg/dL (ref 0.3–1.2)
Total Protein: 8.2 g/dL — ABNORMAL HIGH (ref 6.5–8.1)

## 2022-09-21 LAB — LIPASE, BLOOD: Lipase: 43 U/L (ref 11–51)

## 2022-09-21 LAB — CBG MONITORING, ED: Glucose-Capillary: 82 mg/dL (ref 70–99)

## 2022-09-21 MED ORDER — HYDRALAZINE HCL 25 MG PO TABS
100.0000 mg | ORAL_TABLET | Freq: Once | ORAL | Status: AC
  Administered 2022-09-21: 100 mg via ORAL
  Filled 2022-09-21: qty 4

## 2022-09-21 MED ORDER — HYDROMORPHONE HCL 1 MG/ML IJ SOLN
1.0000 mg | Freq: Once | INTRAMUSCULAR | Status: AC
  Administered 2022-09-21: 1 mg via INTRAVENOUS
  Filled 2022-09-21: qty 1

## 2022-09-21 MED ORDER — ACETAMINOPHEN 325 MG PO TABS
650.0000 mg | ORAL_TABLET | Freq: Once | ORAL | Status: DC
  Filled 2022-09-21: qty 2

## 2022-09-21 MED ORDER — SODIUM CHLORIDE 0.9 % IV BOLUS (SEPSIS)
500.0000 mL | Freq: Once | INTRAVENOUS | Status: AC
  Administered 2022-09-21: 500 mL via INTRAVENOUS

## 2022-09-21 MED ORDER — SODIUM CHLORIDE 0.9 % IV SOLN
1000.0000 mL | INTRAVENOUS | Status: DC
  Administered 2022-09-21: 1000 mL via INTRAVENOUS

## 2022-09-21 MED ORDER — HYDROMORPHONE HCL 1 MG/ML IJ SOLN
0.5000 mg | Freq: Once | INTRAMUSCULAR | Status: AC
  Administered 2022-09-21: 0.5 mg via INTRAVENOUS
  Filled 2022-09-21: qty 1

## 2022-09-21 NOTE — Discharge Instructions (Addendum)
Continue your current medications.  Monitor your blood sugar.  Follow-up with your primary care doctor to be rechecked.

## 2022-09-21 NOTE — ED Triage Notes (Signed)
Patient brought in by EMS after having a near syncopal episode while at church. Reports that he was sitting in church, started sweating and felt like he was about to pass out. Pt reports that he did not pass out. No other complaints.

## 2022-09-21 NOTE — ED Provider Notes (Signed)
[facility] AT [facility] Provider Note   CSN: [id number] Arrival date & time: 09/21/22  1808     History  Chief Complaint  Patient presents with   Near Syncope    Randy [name]. is a 42 y.o. male.   Near Syncope     Patient has a history of diabetes, pancreatitis, hyperlipidemia, hypertension.  Patient states he was at church today when he suddenly felt very diaphoretic and felt like he was going to pass out.  Patient did not actually have a syncopal episode.  Patient was worried that maybe his blood sugar dropped as he took all of his medications at once before going to church.  Patient denies any chest pain.  He is having shortness of breath.  He has not had any vomiting or diarrhea.  Patient does have history of chronic pancreatitis and has been having some abdominal discomfort that he thinks is related to his pancreas.  Patient states he is hungry now would like something to eat  Home Medications Prior to Admission medications   Medication Sig Start Date End Date Taking? Authorizing Provider  amLODipine (NORVASC) 10 MG tablet Take 1 tablet (10 mg total) by mouth daily. 09/09/22  Yes Hoy Register, MD  atorvastatin (LIPITOR) 80 MG tablet Take 1 tablet (80 mg total) by mouth daily. 07/18/22  Yes Hoy Register, MD  carvedilol (COREG) 25 MG tablet Take 1 tablet (25 mg total) by mouth 2 (two) times daily with a meal. 09/09/22  Yes Newlin, Enobong, MD  insulin aspart (NOVOLOG) 100 UNIT/ML FlexPen Inject 10 Units into the skin 3 (three) times daily with meals. 09/09/22  Yes Hoy Register, MD  Insulin Glargine (BASAGLAR KWIKPEN) 100 UNIT/ML Inject 34 Units into the skin daily. 09/09/22  Yes Newlin, Odette Horns, MD  Ipratropium-Albuterol (COMBIVENT) 20-100 MCG/ACT AERS respimat Inhale 1 puff into the lungs every 6 (six) hours as needed for wheezing or shortness of breath. 05/23/22  Yes Sheikh, Omair Latif, DO  Continuous Blood Gluc Receiver (FREESTYLE  LIBRE 2 READER) DEVI Use to check blood glucose continuously. 07/18/22   Hoy Register, MD  Continuous Blood Gluc Sensor (FREESTYLE LIBRE 2 SENSOR) MISC Use to check blood glucose continuously. Change sensors once every 14 days. 07/18/22   Hoy Register, MD  ezetimibe (ZETIA) 10 MG tablet Take 1 tablet (10 mg total) by mouth daily. Patient not taking: Reported on 09/21/2022 09/09/22   Hoy Register, MD  folic acid (FOLVITE) 1 MG tablet Take 1 tablet (1 mg total) by mouth daily. Patient not taking: Reported on 09/21/2022 05/24/22   Marguerita Merles Latif, DO  hydrALAZINE (APRESOLINE) 100 MG tablet Take 1 tablet (100 mg total) by mouth every 8 (eight) hours. Patient not taking: Reported on 09/21/2022 09/09/22   Hoy Register, MD  Insulin Pen Needle (TRUEPLUS 5-BEVEL PEN NEEDLES) 32G X 4 MM MISC Use to inject basaglar once daily. 09/09/22   Hoy Register, MD  pantoprazole (PROTONIX) 40 MG tablet Take 1 tablet (40 mg total) by mouth 2 (two) times daily. Patient not taking: Reported on 09/21/2022 09/09/22   Hoy Register, MD  thiamine (VITAMIN B-1) 100 MG tablet Take 1 tablet (100 mg total) by mouth daily. Patient not taking: Reported on 09/21/2022 05/24/22   Marguerita Merles Latif, DO  traMADol (ULTRAM) 50 MG tablet Take 1 tablet (50 mg total) by mouth every 6 (six) hours as needed. Patient not taking: Reported on 09/21/2022 05/23/22   Merlene Laughter, DO  Allergies    Tomato    Review of Systems   Review of Systems  Cardiovascular:  Positive for near-syncope.    Physical Exam Updated Vital Signs BP (!) 160/106   Pulse 90   Temp 97.8 F (36.6 C)   Resp 14   Ht 1.727 m (5\' 8" )   Wt 87.5 kg   SpO2 97%   BMI 29.33 kg/m  Physical Exam Vitals and nursing note reviewed.  Constitutional:      General: He is not in acute distress.    Appearance: He is well-developed.  HENT:     Head: Normocephalic and atraumatic.     Right Ear: External ear normal.     Left Ear: External ear normal.   Eyes:     General: No scleral icterus.       Right eye: No discharge.        Left eye: No discharge.     Conjunctiva/sclera: Conjunctivae normal.  Neck:     Trachea: No tracheal deviation.  Cardiovascular:     Rate and Rhythm: Normal rate and regular rhythm.  Pulmonary:     Effort: Pulmonary effort is normal. No respiratory distress.     Breath sounds: Normal breath sounds. No stridor. No wheezing or rales.  Abdominal:     General: Bowel sounds are normal. There is no distension.     Palpations: Abdomen is soft.     Tenderness: There is abdominal tenderness. There is no guarding or rebound.     Comments: Mild tenderness in the epigastric region  Musculoskeletal:        General: No tenderness or deformity.     Cervical back: Neck supple.     Right lower leg: No edema.     Left lower leg: No edema.  Skin:    General: Skin is warm and dry.     Findings: No rash.  Neurological:     General: No focal deficit present.     Mental Status: He is alert.     Cranial Nerves: No cranial nerve deficit, dysarthria or facial asymmetry.     Sensory: No sensory deficit.     Motor: No abnormal muscle tone or seizure activity.     Coordination: Coordination normal.  Psychiatric:        Mood and Affect: Mood normal.     ED Results / Procedures / Treatments   Labs (all labs ordered are listed, but only abnormal results are displayed) Labs Reviewed  CBC - Abnormal; Notable for the following components:      Result Value   WBC 11.8 (*)    Hemoglobin 11.9 (*)    HCT 37.0 (*)    All other components within normal limits  COMPREHENSIVE METABOLIC PANEL - Abnormal; Notable for the following components:   Sodium 134 (*)    CO2 20 (*)    Creatinine, Ser 1.39 (*)    Total Protein 8.2 (*)    All other components within normal limits  LIPASE, BLOOD  CBG MONITORING, ED  TROPONIN I (HIGH SENSITIVITY)  TROPONIN I (HIGH SENSITIVITY)    EKG EKG Interpretation  Date/Time:  Saturday Sep 21 2022 18:22:51 EDT Ventricular Rate:  72 PR Interval:  181 QRS Duration: 92 QT Interval:  388 QTC Calculation: 425 R Axis:   33 Text Interpretation: Sinus rhythm No significant change since last tracing Confirmed by Linwood Dibbles [phone]) on 09/21/2022 6:37:08 PM  Radiology DG Chest Portable 1 View  Result Date: 09/21/2022 CLINICAL  DATA:  Near syncopal event EXAM: PORTABLE CHEST 1 VIEW COMPARISON:  05/26/2022 FINDINGS: The heart size and mediastinal contours are within normal limits. Both lungs are clear. The visualized skeletal structures are unremarkable. IMPRESSION: No active disease. Electronically Signed   By: Alcide Clever M.D.   On: 09/21/2022 19:42    Procedures Procedures    Medications Ordered in ED Medications  sodium chloride 0.9 % bolus 500 mL (0 mLs Intravenous Stopped 09/21/22 1934)    Followed by  0.9 %  sodium chloride infusion (1,000 mLs Intravenous New Bag/Given 09/21/22 1906)  acetaminophen (TYLENOL) tablet 650 mg (650 mg Oral Not Given 09/21/22 2009)  HYDROmorphone (DILAUDID) injection 0.5 mg (has no administration in time range)  HYDROmorphone (DILAUDID) injection 1 mg (1 mg Intravenous Given 09/21/22 2035)  hydrALAZINE (APRESOLINE) tablet 100 mg (100 mg Oral Given 09/21/22 2108)    ED Course/ Medical Decision Making/ A&P Clinical Course as of 09/21/22 2221  Sat Sep 21, 2022  2020 Patient records reviewed.  Patient had a CT scan on April 22 without acute abnormalities [JK]  2048 Lipase is normal.  White blood cell count only slightly elevated. [JK]  2048 Hemoglobin improved compared to previous.  Metabolic panel without acute abnormalities. [JK]  2055 Patient has been able to eat a sandwich.  No nausea and vomiting.  No recurrent syncopal episodes.  No dysrhythmia.  Patient concerned about going home.  We will monitor in the ED a little longer. [JK]  2149 Troponin I (High Sensitivity) Delta trop normal [JK]    Clinical Course User Index [JK] Linwood Dibbles, MD                              Medical Decision Making Problems Addressed: Chronic abdominal pain: chronic illness or injury with exacerbation, progression, or side effects of treatment Hypertension, unspecified type: chronic illness or injury Near syncope: acute illness or injury that poses a threat to life or bodily functions  Amount and/or Complexity of Data Reviewed Labs: ordered. Decision-making details documented in ED Course. Radiology: ordered and independent interpretation performed.  Risk OTC drugs. Prescription drug management.   Patient presented to the ED for evaluation of a near syncopal episode.  Patient was at church when he became very diaphoretic he thought he was going to pass out.  Patient also has been experiencing pain in his abdomen that he associates with his pancreas.  Review his records show the patient has a history of recurrent abdominal pain.  Recent CT scan in April without acute findings.  ED workup reassuring.  No signs of anemia.  Labs without signs of severe dehydration or electrolyte abnormality.  His lipase is normal.  Troponin is normal.  No signs of cardiac dysrhythmia in the ED.  Patient noted to be hypotensive.  I did give him a dose of his oral medications.  Patient was also given medications for his chronic abdominal pain.  Patient has been able to eat and drink without difficulty.  He ate a sandwich here.  Evaluation and diagnostic testing in the emergency department does not suggest an emergent condition requiring admission or immediate intervention beyond what has been performed at this time.  The patient is safe for discharge and has been instructed to return immediately for worsening symptoms, change in symptoms or any other concerns.         Final Clinical Impression(s) / ED Diagnoses Final diagnoses:  Near syncope  Chronic abdominal  pain  Hypertension, unspecified type    Rx / DC Orders ED Discharge Orders     None         Linwood Dibbles, MD 09/21/22 2221

## 2022-09-23 ENCOUNTER — Other Ambulatory Visit: Payer: Self-pay

## 2022-10-10 ENCOUNTER — Other Ambulatory Visit: Payer: Self-pay

## 2022-10-17 ENCOUNTER — Other Ambulatory Visit: Payer: Self-pay

## 2022-10-19 ENCOUNTER — Inpatient Hospital Stay (HOSPITAL_COMMUNITY)
Admission: EM | Admit: 2022-10-19 | Discharge: 2022-10-25 | DRG: 440 | Disposition: A | Discharge status: Home / Self Care | Payer: 59 | Attending: Family Medicine | Admitting: Family Medicine

## 2022-10-19 ENCOUNTER — Other Ambulatory Visit: Payer: Self-pay

## 2022-10-19 DIAGNOSIS — Z79899 Other long term (current) drug therapy: Secondary | ICD-10-CM

## 2022-10-19 DIAGNOSIS — R7989 Other specified abnormal findings of blood chemistry: Secondary | ICD-10-CM | POA: Diagnosis not present

## 2022-10-19 DIAGNOSIS — E860 Dehydration: Secondary | ICD-10-CM | POA: Diagnosis present

## 2022-10-19 DIAGNOSIS — K859 Acute pancreatitis without necrosis or infection, unspecified: Secondary | ICD-10-CM | POA: Diagnosis not present

## 2022-10-19 DIAGNOSIS — E876 Hypokalemia: Secondary | ICD-10-CM | POA: Diagnosis present

## 2022-10-19 DIAGNOSIS — F1210 Cannabis abuse, uncomplicated: Secondary | ICD-10-CM | POA: Diagnosis present

## 2022-10-19 DIAGNOSIS — Z91018 Allergy to other foods: Secondary | ICD-10-CM

## 2022-10-19 DIAGNOSIS — E1165 Type 2 diabetes mellitus with hyperglycemia: Secondary | ICD-10-CM | POA: Diagnosis present

## 2022-10-19 DIAGNOSIS — D631 Anemia in chronic kidney disease: Secondary | ICD-10-CM | POA: Diagnosis present

## 2022-10-19 DIAGNOSIS — I10 Essential (primary) hypertension: Secondary | ICD-10-CM | POA: Diagnosis present

## 2022-10-19 DIAGNOSIS — I129 Hypertensive chronic kidney disease with stage 1 through stage 4 chronic kidney disease, or unspecified chronic kidney disease: Secondary | ICD-10-CM | POA: Diagnosis present

## 2022-10-19 DIAGNOSIS — E1143 Type 2 diabetes mellitus with diabetic autonomic (poly)neuropathy: Secondary | ICD-10-CM | POA: Diagnosis present

## 2022-10-19 DIAGNOSIS — R112 Nausea with vomiting, unspecified: Secondary | ICD-10-CM | POA: Diagnosis present

## 2022-10-19 DIAGNOSIS — I160 Hypertensive urgency: Secondary | ICD-10-CM | POA: Diagnosis present

## 2022-10-19 DIAGNOSIS — E1122 Type 2 diabetes mellitus with diabetic chronic kidney disease: Secondary | ICD-10-CM | POA: Diagnosis present

## 2022-10-19 DIAGNOSIS — E785 Hyperlipidemia, unspecified: Secondary | ICD-10-CM | POA: Diagnosis present

## 2022-10-19 DIAGNOSIS — Z8249 Family history of ischemic heart disease and other diseases of the circulatory system: Secondary | ICD-10-CM

## 2022-10-19 DIAGNOSIS — K861 Other chronic pancreatitis: Secondary | ICD-10-CM | POA: Diagnosis present

## 2022-10-19 DIAGNOSIS — K3184 Gastroparesis: Secondary | ICD-10-CM | POA: Diagnosis present

## 2022-10-19 DIAGNOSIS — Z794 Long term (current) use of insulin: Secondary | ICD-10-CM

## 2022-10-19 DIAGNOSIS — Z87891 Personal history of nicotine dependence: Secondary | ICD-10-CM

## 2022-10-19 DIAGNOSIS — Z833 Family history of diabetes mellitus: Secondary | ICD-10-CM

## 2022-10-19 DIAGNOSIS — N1831 Chronic kidney disease, stage 3a: Secondary | ICD-10-CM | POA: Diagnosis present

## 2022-10-19 DIAGNOSIS — H548 Legal blindness, as defined in USA: Secondary | ICD-10-CM | POA: Diagnosis present

## 2022-10-19 NOTE — ED Triage Notes (Signed)
Pt BIB [facility] from home d/t concerns for back pain, HTN, and chest pain.  Endorses lower back back pain. No injury/trauma. No leg numbness. C/o left sided chest pain, described as stabbing. Pain associated with NV and dizziness. No SOB. S/s feels like pancreatic flare up in the past, worsening in severity today.   Pt is legally blind.

## 2022-10-20 ENCOUNTER — Emergency Department (HOSPITAL_COMMUNITY): Payer: 59

## 2022-10-20 DIAGNOSIS — E119 Type 2 diabetes mellitus without complications: Secondary | ICD-10-CM

## 2022-10-20 DIAGNOSIS — Z794 Long term (current) use of insulin: Secondary | ICD-10-CM

## 2022-10-20 DIAGNOSIS — E1165 Type 2 diabetes mellitus with hyperglycemia: Secondary | ICD-10-CM

## 2022-10-20 DIAGNOSIS — K861 Other chronic pancreatitis: Secondary | ICD-10-CM

## 2022-10-20 DIAGNOSIS — I10 Essential (primary) hypertension: Secondary | ICD-10-CM

## 2022-10-20 DIAGNOSIS — M549 Dorsalgia, unspecified: Secondary | ICD-10-CM

## 2022-10-20 DIAGNOSIS — R112 Nausea with vomiting, unspecified: Secondary | ICD-10-CM | POA: Diagnosis not present

## 2022-10-20 DIAGNOSIS — R109 Unspecified abdominal pain: Secondary | ICD-10-CM | POA: Diagnosis not present

## 2022-10-20 DIAGNOSIS — K859 Acute pancreatitis without necrosis or infection, unspecified: Secondary | ICD-10-CM | POA: Diagnosis not present

## 2022-10-20 DIAGNOSIS — F1210 Cannabis abuse, uncomplicated: Secondary | ICD-10-CM

## 2022-10-20 LAB — COMPREHENSIVE METABOLIC PANEL
ALT: 21 U/L (ref 0–44)
ALT: 18 U/L (ref 0–44)
AST: 47 U/L — ABNORMAL HIGH (ref 15–41)
AST: 18 U/L (ref 15–41)
Albumin: 3.4 g/dL — ABNORMAL LOW (ref 3.5–5.0)
Albumin: 3 g/dL — ABNORMAL LOW (ref 3.5–5.0)
Alkaline Phosphatase: 104 U/L (ref 38–126)
Alkaline Phosphatase: 103 U/L (ref 38–126)
Anion gap: 11 (ref 5–15)
Anion gap: 7 (ref 5–15)
BUN: 18 mg/dL (ref 6–20)
BUN: 15 mg/dL (ref 6–20)
CO2: 23 mmol/L (ref 22–32)
CO2: 25 mmol/L (ref 22–32)
Calcium: 8.8 mg/dL — ABNORMAL LOW (ref 8.9–10.3)
Calcium: 8.4 mg/dL — ABNORMAL LOW (ref 8.9–10.3)
Chloride: 100 mmol/L (ref 98–111)
Chloride: 103 mmol/L (ref 98–111)
Creatinine, Ser: 1.59 mg/dL — ABNORMAL HIGH (ref 0.61–1.24)
Creatinine, Ser: 1.46 mg/dL — ABNORMAL HIGH (ref 0.61–1.24)
GFR, Estimated: 55 mL/min — ABNORMAL LOW (ref >60)
GFR, Estimated: >60 mL/min (ref >60)
Glucose, Bld: 290 mg/dL — ABNORMAL HIGH (ref 70–99)
Glucose, Bld: 271 mg/dL — ABNORMAL HIGH (ref 70–99)
Potassium: 5 mmol/L (ref 3.5–5.1)
Potassium: 3.7 mmol/L (ref 3.5–5.1)
Sodium: 134 mmol/L — ABNORMAL LOW (ref 135–145)
Sodium: 135 mmol/L (ref 135–145)
Total Bilirubin: 1.7 mg/dL — ABNORMAL HIGH (ref 0.3–1.2)
Total Bilirubin: 0.6 mg/dL (ref 0.3–1.2)
Total Protein: 6.8 g/dL (ref 6.5–8.1)
Total Protein: 6.1 g/dL — ABNORMAL LOW (ref 6.5–8.1)

## 2022-10-20 LAB — CBC
HCT: 31.8 % — ABNORMAL LOW (ref 39.0–52.0)
HCT: 35.5 % — ABNORMAL LOW (ref 39.0–52.0)
Hemoglobin: 10.2 g/dL — ABNORMAL LOW (ref 13.0–17.0)
Hemoglobin: 11.4 g/dL — ABNORMAL LOW (ref 13.0–17.0)
MCH: 27.1 pg (ref 26.0–34.0)
MCH: 27.3 pg (ref 26.0–34.0)
MCHC: 32.1 g/dL (ref 30.0–36.0)
MCHC: 32.1 g/dL (ref 30.0–36.0)
MCV: 84.4 fL (ref 80.0–100.0)
MCV: 85.1 fL (ref 80.0–100.0)
Platelets: 251 10*3/uL (ref 150–400)
Platelets: 304 10*3/uL (ref 150–400)
RBC: 3.77 MIL/uL — ABNORMAL LOW (ref 4.22–5.81)
RBC: 4.17 MIL/uL — ABNORMAL LOW (ref 4.22–5.81)
RDW: 13 % (ref 11.5–15.5)
RDW: 13.1 % (ref 11.5–15.5)
WBC: 6.4 10*3/uL (ref 4.0–10.5)
WBC: 6.6 10*3/uL (ref 4.0–10.5)
nRBC: 0 % (ref 0.0–0.2)
nRBC: 0 % (ref 0.0–0.2)

## 2022-10-20 LAB — CBG MONITORING, ED: Glucose-Capillary: 275 mg/dL — ABNORMAL HIGH (ref 70–99)

## 2022-10-20 LAB — LIPASE, BLOOD: Lipase: 143 U/L — ABNORMAL HIGH (ref 11–51)

## 2022-10-20 LAB — URINALYSIS, ROUTINE W REFLEX MICROSCOPIC
Bacteria, UA: NONE SEEN
Bilirubin Urine: NEGATIVE
Glucose, UA: >=500 mg/dL — AB
Ketones, ur: NEGATIVE mg/dL
Leukocytes,Ua: NEGATIVE
Nitrite: NEGATIVE
Protein, ur: 100 mg/dL — AB
Specific Gravity, Urine: 1.013 (ref 1.005–1.030)
pH: 7 (ref 5.0–8.0)

## 2022-10-20 LAB — GLUCOSE, CAPILLARY
Glucose-Capillary: 246 mg/dL — ABNORMAL HIGH (ref 70–99)
Glucose-Capillary: 160 mg/dL — ABNORMAL HIGH (ref 70–99)
Glucose-Capillary: 117 mg/dL — ABNORMAL HIGH (ref 70–99)
Glucose-Capillary: 120 mg/dL — ABNORMAL HIGH (ref 70–99)
Glucose-Capillary: 166 mg/dL — ABNORMAL HIGH (ref 70–99)

## 2022-10-20 LAB — TROPONIN I (HIGH SENSITIVITY)
Troponin I (High Sensitivity): 18 ng/L — ABNORMAL HIGH (ref <18)
Troponin I (High Sensitivity): 20 ng/L — ABNORMAL HIGH (ref <18)

## 2022-10-20 MED ORDER — HYDRALAZINE HCL 50 MG PO TABS
100.0000 mg | ORAL_TABLET | Freq: Three times a day (TID) | ORAL | Status: DC
  Administered 2022-10-20 – 2022-10-25 (×14): 100 mg via ORAL
  Filled 2022-10-20 (×16): qty 2

## 2022-10-20 MED ORDER — SODIUM CHLORIDE 0.9 % IV SOLN
12.5000 mg | Freq: Four times a day (QID) | INTRAVENOUS | Status: DC | PRN
  Administered 2022-10-20 – 2022-10-22 (×3): 12.5 mg via INTRAVENOUS
  Filled 2022-10-20: qty 12.5
  Filled 2022-10-20: qty 0.5
  Filled 2022-10-20: qty 12.5
  Filled 2022-10-20: qty 0.5

## 2022-10-20 MED ORDER — LACTATED RINGERS IV SOLN: INTRAVENOUS | Status: DC

## 2022-10-20 MED ORDER — INSULIN GLARGINE-YFGN 100 UNIT/ML ~~LOC~~ SOLN
34.0000 [IU] | Freq: Every day | SUBCUTANEOUS | Status: DC
  Administered 2022-10-20 – 2022-10-21 (×2): 34 [IU] via SUBCUTANEOUS
  Filled 2022-10-20 (×3): qty 0.34

## 2022-10-20 MED ORDER — HYDRALAZINE HCL 20 MG/ML IJ SOLN
5.0000 mg | INTRAMUSCULAR | Status: DC | PRN
  Administered 2022-10-20: 10 mg via INTRAVENOUS
  Filled 2022-10-20: qty 1

## 2022-10-20 MED ORDER — INSULIN ASPART 100 UNIT/ML IJ SOLN
0.0000 [IU] | INTRAMUSCULAR | Status: DC
  Administered 2022-10-20: 2 [IU] via SUBCUTANEOUS
  Administered 2022-10-20: 5 [IU] via SUBCUTANEOUS
  Administered 2022-10-20: 2 [IU] via SUBCUTANEOUS
  Administered 2022-10-20: 3 [IU] via SUBCUTANEOUS
  Administered 2022-10-21 (×3): 2 [IU] via SUBCUTANEOUS
  Administered 2022-10-22 (×2): 1 [IU] via SUBCUTANEOUS
  Administered 2022-10-23: 5 [IU] via SUBCUTANEOUS

## 2022-10-20 MED ORDER — HYDROMORPHONE HCL 1 MG/ML IJ SOLN
0.5000 mg | INTRAMUSCULAR | Status: DC | PRN
  Administered 2022-10-20 (×2): 0.5 mg via INTRAVENOUS
  Filled 2022-10-20 (×2): qty 0.5

## 2022-10-20 MED ORDER — HYDRALAZINE HCL 20 MG/ML IJ SOLN: 10.0000 mg | Freq: Once | INTRAMUSCULAR | Status: DC

## 2022-10-20 MED ORDER — ACETAMINOPHEN 650 MG RE SUPP: 650.0000 mg | Freq: Four times a day (QID) | RECTAL | Status: DC | PRN

## 2022-10-20 MED ORDER — HYDROMORPHONE HCL 1 MG/ML IJ SOLN
1.0000 mg | INTRAMUSCULAR | Status: DC | PRN
  Administered 2022-10-20: 1 mg via INTRAVENOUS
  Administered 2022-10-21 (×3): 2 mg via INTRAVENOUS
  Administered 2022-10-21 – 2022-10-22 (×5): 1 mg via INTRAVENOUS
  Administered 2022-10-22: 2 mg via INTRAVENOUS
  Administered 2022-10-23 – 2022-10-24 (×5): 1 mg via INTRAVENOUS
  Administered 2022-10-24: 2 mg via INTRAVENOUS
  Filled 2022-10-20 (×6): qty 1
  Filled 2022-10-20: qty 2
  Filled 2022-10-20 (×3): qty 1
  Filled 2022-10-20 (×4): qty 2
  Filled 2022-10-20 (×2): qty 1

## 2022-10-20 MED ORDER — AMLODIPINE BESYLATE 10 MG PO TABS
10.0000 mg | ORAL_TABLET | Freq: Every day | ORAL | Status: DC
  Administered 2022-10-20 – 2022-10-25 (×6): 10 mg via ORAL
  Filled 2022-10-20 (×6): qty 1

## 2022-10-20 MED ORDER — METOCLOPRAMIDE HCL 5 MG/ML IJ SOLN
10.0000 mg | Freq: Four times a day (QID) | INTRAMUSCULAR | Status: DC
  Administered 2022-10-20 – 2022-10-25 (×20): 10 mg via INTRAVENOUS
  Filled 2022-10-20 (×21): qty 2

## 2022-10-20 MED ORDER — HYDRALAZINE HCL 20 MG/ML IJ SOLN
5.0000 mg | Freq: Once | INTRAMUSCULAR | Status: AC
  Administered 2022-10-20: 5 mg via INTRAVENOUS
  Filled 2022-10-20: qty 1

## 2022-10-20 MED ORDER — ENOXAPARIN SODIUM 40 MG/0.4ML IJ SOSY
40.0000 mg | PREFILLED_SYRINGE | INTRAMUSCULAR | Status: DC
  Administered 2022-10-21 – 2022-10-24 (×4): 40 mg via SUBCUTANEOUS
  Filled 2022-10-20 (×5): qty 0.4

## 2022-10-20 MED ORDER — CARVEDILOL 25 MG PO TABS
25.0000 mg | ORAL_TABLET | Freq: Two times a day (BID) | ORAL | Status: DC
  Administered 2022-10-20 – 2022-10-25 (×10): 25 mg via ORAL
  Filled 2022-10-20 (×11): qty 1

## 2022-10-20 MED ORDER — HYDROMORPHONE HCL 1 MG/ML IJ SOLN
0.5000 mg | INTRAMUSCULAR | Status: DC | PRN
  Administered 2022-10-20: 0.5 mg via INTRAVENOUS
  Filled 2022-10-20: qty 1

## 2022-10-20 MED ORDER — ATORVASTATIN CALCIUM 80 MG PO TABS
80.0000 mg | ORAL_TABLET | Freq: Every day | ORAL | Status: DC
  Administered 2022-10-20 – 2022-10-25 (×6): 80 mg via ORAL
  Filled 2022-10-20 (×6): qty 1

## 2022-10-20 MED ORDER — TRAMADOL HCL 50 MG PO TABS
50.0000 mg | ORAL_TABLET | Freq: Four times a day (QID) | ORAL | Status: DC | PRN
  Administered 2022-10-21 – 2022-10-23 (×3): 50 mg via ORAL
  Filled 2022-10-20 (×3): qty 1

## 2022-10-20 MED ORDER — SODIUM CHLORIDE 0.9 % IV BOLUS
1000.0000 mL | Freq: Once | INTRAVENOUS | Status: AC
  Administered 2022-10-20: 1000 mL via INTRAVENOUS

## 2022-10-20 MED ORDER — IPRATROPIUM-ALBUTEROL 0.5-2.5 (3) MG/3ML IN SOLN: 3.0000 mL | Freq: Four times a day (QID) | RESPIRATORY_TRACT | Status: DC | PRN

## 2022-10-20 MED ORDER — PANTOPRAZOLE SODIUM 40 MG PO TBEC
40.0000 mg | DELAYED_RELEASE_TABLET | Freq: Two times a day (BID) | ORAL | Status: DC
  Administered 2022-10-20 – 2022-10-25 (×11): 40 mg via ORAL
  Filled 2022-10-20 (×11): qty 1

## 2022-10-20 MED ORDER — HYDROMORPHONE HCL 1 MG/ML IJ SOLN
1.0000 mg | Freq: Once | INTRAMUSCULAR | Status: AC
  Administered 2022-10-20: 1 mg via INTRAVENOUS
  Filled 2022-10-20: qty 1

## 2022-10-20 MED ORDER — ACETAMINOPHEN 325 MG PO TABS: 650.0000 mg | ORAL_TABLET | Freq: Four times a day (QID) | ORAL | Status: DC | PRN

## 2022-10-20 MED ORDER — IOHEXOL 350 MG/ML SOLN
75.0000 mL | Freq: Once | INTRAVENOUS | Status: AC | PRN
  Administered 2022-10-20: 75 mL via INTRAVENOUS

## 2022-10-20 MED ORDER — ONDANSETRON HCL 4 MG/2ML IJ SOLN
4.0000 mg | Freq: Once | INTRAMUSCULAR | Status: AC
  Administered 2022-10-20: 4 mg via INTRAVENOUS
  Filled 2022-10-20: qty 2

## 2022-10-20 MED ORDER — HYDRALAZINE HCL 50 MG PO TABS: 50.0000 mg | ORAL_TABLET | Freq: Three times a day (TID) | ORAL | Status: DC

## 2022-10-20 MED ORDER — HYDRALAZINE HCL 20 MG/ML IJ SOLN
20.0000 mg | INTRAMUSCULAR | Status: DC | PRN
  Administered 2022-10-20 (×2): 20 mg via INTRAVENOUS
  Filled 2022-10-20 (×2): qty 1

## 2022-10-20 MED ORDER — CLONIDINE HCL 0.1 MG PO TABS
0.1000 mg | ORAL_TABLET | Freq: Two times a day (BID) | ORAL | Status: DC
  Administered 2022-10-21 (×2): 0.1 mg via ORAL
  Filled 2022-10-20 (×3): qty 1

## 2022-10-20 MED ORDER — HYDROMORPHONE HCL 1 MG/ML IJ SOLN
0.5000 mg | INTRAMUSCULAR | Status: DC | PRN
  Administered 2022-10-20: 1 mg via INTRAVENOUS
  Filled 2022-10-20: qty 1

## 2022-10-20 NOTE — ED Notes (Signed)
PA at bedside for ultrasound IV 

## 2022-10-20 NOTE — ED Notes (Signed)
US at bedside for IV placement

## 2022-10-20 NOTE — Consult Note (Addendum)
Consultation  Referring Provider:  [facility]  Primary Care Physician:  Georganna Skeans, MD Primary Gastroenterologist:  Dr. Lavon Paganini       Reason for Consultation:     nausea and vomiting  LOS: 0 days          HPI:   Randy Debevec. is a 42 y.o. male with past medical history significant for HTN, HLD, DM2, recurrent pancreatitis, presents for evaluation of nausea, vomiting, and abdominal pain.  No family was present at the time of my evaluation.  Patient reports new onset lower back pain that started two days ago. Radiated to his lower abdomen. Pain is intermittent, though when it comes on office for follow-up.  Reports pain is not source of pain.  Pain sometimes associated with eating.  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 Had nausea and vomiting this morning (noted nonbloody bilious emesis in emesis bag beside patient). Denies NSAIDs, tobacco, and alcohol use. Reports normal bowel movements.  Upon admission: CTA chest/ab/pelvis showed mild edema and subtle stranding around pancreatic head. Lipase slightly elevated at 143. Sodium 134. Glucose 290. No leukocytosis.  Previous workup has shown Triglycerides 245 (Feb 2023). Negative IgG. Previous alcohol use 20 years prior. UDA positive for marijuana April 2024.   Past Medical History:  Diagnosis Date   Essential hypertension 05/31/2019   HLD (hyperlipidemia)    Pancreatitis    Type II diabetes mellitus (HCC)     Surgical History:  He  has a past surgical history that includes No past surgeries. Family History:  His family history includes Diabetes in his brother, father, and maternal grandmother; Hypertension in his father and mother. Social History:   reports that he quit smoking about 10 years ago. His smoking use included cigarettes. He has a 5.61 pack-year smoking history. He has been exposed to tobacco smoke. He has never used smokeless tobacco. He  reports that he does not currently use drugs after having used the following drugs: Marijuana. He reports that he does not drink alcohol.  Prior to Admission medications   Medication Sig Start Date End Date Taking? Authorizing Provider  amLODipine (NORVASC) 10 MG tablet Take 1 tablet (10 mg total) by mouth daily. 09/09/22   Hoy Register, MD  atorvastatin (LIPITOR) 80 MG tablet Take 1 tablet (80 mg total) by mouth daily. 07/18/22   Hoy Register, MD  carvedilol (COREG) 25 MG tablet Take 1 tablet (25 mg total) by mouth 2 (two) times daily with a meal. 09/09/22   Hoy Register, MD  Continuous Blood Gluc Receiver (FREESTYLE LIBRE 2 READER) DEVI Use to check blood glucose continuously. 07/18/22   Hoy Register, MD  Continuous Blood Gluc Sensor (FREESTYLE LIBRE 2 SENSOR) MISC Use to check blood glucose continuously. Change sensors once every 14 days. 07/18/22   Hoy Register, MD  hydrALAZINE (APRESOLINE) 100 MG tablet Take 1 tablet (100 mg total) by mouth every 8 (eight) hours. Patient not taking: Reported on 09/21/2022 09/09/22   Hoy Register, MD  insulin aspart (NOVOLOG) 100 UNIT/ML FlexPen Inject 10 Units into the skin 3 (three) times daily with meals. 09/09/22   Hoy Register, MD  Insulin Glargine (BASAGLAR KWIKPEN) 100 UNIT/ML Inject 34 Units into the skin daily. 09/09/22   Hoy Register, MD  Insulin Pen Needle (TRUEPLUS 5-BEVEL PEN NEEDLES) 32G X 4 MM MISC Use to inject basaglar once daily. 09/09/22   Hoy Register, MD  Ipratropium-Albuterol (COMBIVENT) 20-100 MCG/ACT AERS respimat Inhale 1 puff into the lungs every 6 (six) hours as needed for wheezing or shortness of breath. 05/23/22   Marguerita Merles Latif, DO  pantoprazole (PROTONIX) 40 MG tablet Take 1 tablet (40 mg total) by mouth 2 (two) times daily. Patient not taking: Reported on 09/21/2022 09/09/22   Hoy Register, MD  thiamine (VITAMIN B-1) 100 MG tablet Take 1 tablet (100 mg total) by mouth daily. Patient not taking: Reported on  09/21/2022 05/24/22   Marguerita Merles Latif, DO  traMADol (ULTRAM) 50 MG tablet Take 1 tablet (50 mg total) by mouth every 6 (six) hours as needed. Patient not taking: Reported on 09/21/2022 05/23/22   Merlene Laughter, DO    Current Facility-Administered Medications  Medication Dose Route Frequency Provider Last Rate Last Admin   acetaminophen (TYLENOL) tablet 650 mg  650 mg Oral Q6H PRN Hillary Bow, DO       Or   acetaminophen (TYLENOL) suppository 650 mg  650 mg Rectal Q6H PRN Hillary Bow, DO       amLODipine (NORVASC) tablet 10 mg  10 mg Oral Daily Lyda Perone M, DO   10 mg at 10/20/22 0932   atorvastatin (LIPITOR) tablet 80 mg  80 mg Oral Daily Hillary Bow, DO   80 mg at 10/20/22 0932   carvedilol (COREG) tablet 25 mg  25 mg Oral BID WC Lyda Perone M, DO   25 mg at 10/20/22 0812   enoxaparin (LOVENOX) injection 40 mg  40 mg Subcutaneous Q24H Lyda Perone M, DO       hydrALAZINE (APRESOLINE) injection 20 mg  20 mg Intravenous Q4H PRN Osvaldo Shipper, MD       hydrALAZINE (APRESOLINE) tablet 100 mg  100 mg Oral Q8H Osvaldo Shipper, MD   100 mg at 10/20/22 0933   HYDROmorphone (DILAUDID) injection 0.5 mg  0.5 mg Intravenous Q2H PRN Hillary Bow, DO   0.5 mg at 10/20/22 1610   insulin aspart (novoLOG) injection 0-9 Units  0-9 Units Subcutaneous Q4H Hillary Bow, DO   3 Units at 10/20/22 0817   insulin glargine-yfgn (SEMGLEE) injection 34 Units  34 Units Subcutaneous Daily Hillary Bow, DO   34 Units at 10/20/22 0935   ipratropium-albuterol (DUONEB) 0.5-2.5 (3) MG/3ML nebulizer solution 3 mL  3 mL Inhalation Q6H PRN Hillary Bow, DO       lactated ringers infusion   Intravenous Continuous Hillary Bow, DO 100 mL/hr at 10/20/22 0513 New Bag at 10/20/22 0513   metoCLOPramide (REGLAN) injection 10 mg  10 mg Intravenous Q6H Lyda Perone M, DO   10 mg at 10/20/22 0518   pantoprazole (PROTONIX) EC tablet 40 mg  40 mg Oral BID Hillary Bow, DO   40 mg at  10/20/22 0932   promethazine (PHENERGAN) 12.5 mg in sodium chloride 0.9 % 50 mL IVPB  12.5 mg Intravenous Q6H PRN Osvaldo Shipper, MD       traMADol Janean Sark) tablet 50 mg  50 mg Oral Q6H PRN Hillary Bow, DO        Allergies as of 10/19/2022 - Review Complete 10/19/2022  Allergen Reaction Noted   Tomato Itching 09/12/2020    Review of Systems  Constitutional:  Negative for chills, fever and weight loss.  HENT:  Negative for hearing loss and tinnitus.   Eyes:  Negative for blurred vision and double vision.  Respiratory:  Negative for cough and sputum production.  Cardiovascular:  Negative for chest pain and orthopnea.  Gastrointestinal:  Positive for abdominal pain, nausea and vomiting. Negative for blood in stool, constipation, diarrhea, heartburn and melena.  Genitourinary:  Negative for dysuria and urgency.  Musculoskeletal:  Negative for myalgias and neck pain.  Skin:  Negative for itching and rash.  Neurological:  Negative for seizures and loss of consciousness.  Psychiatric/Behavioral:  Negative for depression and suicidal ideas.        Physical Exam:  Vital signs in last 24 hours: Temp:  [97.7 F (36.5 C)-98.4 F (36.9 C)] 97.7 F (36.5 C) (06/09 0800) Pulse Rate:  [80-100] 94 (06/09 0936) Resp:  [12-20] 15 (06/09 0549) BP: (168-251)/(93-131) 225/120 (06/09 0936) SpO2:  [94 %-100 %] 96 % (06/09 0812) Weight:  [87.1 kg] 87.1 kg (06/08 2336) Last BM Date : 10/18/22 (PTA) Last BM recorded by nurses in past 5 days No data recorded  Physical Exam Constitutional:      Appearance: He is well-developed. He is not ill-appearing.  HENT:     Nose: Nose normal. No congestion.     Mouth/Throat:     Mouth: Mucous membranes are moist.     Pharynx: Oropharynx is clear.  Eyes:     General: No scleral icterus.    Extraocular Movements: Extraocular movements intact.  Cardiovascular:     Rate and Rhythm: Regular rhythm. Tachycardia present.  Pulmonary:     Effort:  Pulmonary effort is normal. No respiratory distress.  Abdominal:     General: Abdomen is flat. Bowel sounds are normal. There is no distension.     Palpations: Abdomen is soft. There is no mass.     Tenderness: There is no abdominal tenderness. There is no guarding or rebound.     Hernia: No hernia is present.  Musculoskeletal:        General: No swelling. Normal range of motion.     Cervical back: Normal range of motion and neck supple.  Skin:    General: Skin is warm and dry.     Coloration: Skin is not jaundiced.  Neurological:     General: No focal deficit present.     Mental Status: He is oriented to person, place, and time.  Psychiatric:        Mood and Affect: Mood normal.        Behavior: Behavior normal.        Thought Content: Thought content normal.        Judgment: Judgment normal.      LAB RESULTS: Recent Labs    10/19/22 2357 10/20/22 0512  WBC 6.4 6.6  HGB 11.4* 10.2*  HCT 35.5* 31.8*  PLT 251 304   BMET Recent Labs    10/19/22 2357 10/20/22 0512  NA 134* 135  K 5.0 3.7  CL 100 103  CO2 23 25  GLUCOSE 290* 271*  BUN 18 15  CREATININE 1.59* 1.46*  CALCIUM 8.8* 8.4*   LFT Recent Labs    10/20/22 0512  PROT 6.1*  ALBUMIN 3.0*  AST 18  ALT 18  ALKPHOS 103  BILITOT 0.6   PT/INR No results for input(s): "LABPROT", "INR" in the last 72 hours.  STUDIES: CT Angio Chest/Abd/Pel for Dissection W and/or Wo Contrast  Result Date: 10/20/2022 CLINICAL DATA:  Patient believes he is having a pancreas flare-up. Acute aortic syndrome suspected EXAM: CT ANGIOGRAPHY CHEST, ABDOMEN AND PELVIS TECHNIQUE: Non-contrast CT of the chest was initially obtained. Multidetector CT imaging through the chest, abdomen and pelvis  was performed using the standard protocol during bolus administration of intravenous contrast. Multiplanar reconstructed images and MIPs were obtained and reviewed to evaluate the vascular anatomy. RADIATION DOSE REDUCTION: This exam was performed  according to the departmental dose-optimization program which includes automated exposure control, adjustment of the mA and/or kV according to patient size and/or use of iterative reconstruction technique. CONTRAST:  75mL OMNIPAQUE IOHEXOL 350 MG/ML SOLN COMPARISON:  Chest radiograph 10/20/2022; CTA chest abdomen and pelvis 09/12/2020 and CT abdomen and pelvis 05/20/2022 FINDINGS: CTA CHEST FINDINGS Cardiovascular: No acute aortic syndrome. Borderline cardiomegaly. No pericardial effusion. Mediastinum/Nodes: No enlarged mediastinal, hilar, or axillary lymph nodes. Thyroid gland, trachea, and esophagus demonstrate no significant findings. Lungs/Pleura: Lungs are clear. No pleural effusion or pneumothorax. Musculoskeletal: No chest wall abnormality. No acute or significant osseous findings. Review of the MIP images confirms the above findings. CTA ABDOMEN AND PELVIS FINDINGS VASCULAR Aorta: Normal caliber aorta without aneurysm, dissection, vasculitis or significant stenosis. Celiac: Patent without evidence of aneurysm, dissection, vasculitis or significant stenosis. SMA: Patent without evidence of aneurysm, dissection, vasculitis or significant stenosis. Renals: Both renal arteries are patent without evidence of aneurysm, dissection, vasculitis, fibromuscular dysplasia or significant stenosis. IMA: Patent without evidence of aneurysm, dissection, vasculitis or significant stenosis. Inflow: Patent without evidence of aneurysm, dissection, vasculitis or significant stenosis. Veins: No obvious venous abnormality within the limitations of this arterial phase study. Review of the MIP images confirms the above findings. NON-VASCULAR Hepatobiliary: No focal liver abnormality is seen. No gallstones, gallbladder wall thickening, or biliary dilatation. Pancreas: Mild edema and subtle stranding about the pancreatic head. No fluid collection. No ductal dilation. Spleen: Unremarkable. Adrenals/Urinary Tract: Normal adrenal  glands. No urinary calculi or hydronephrosis. Unremarkable bladder. Stomach/Bowel: Normal caliber large and small bowel. Normal appendix. No bowel wall thickening. Stomach is within normal limits. Lymphatic: No lymphadenopathy. Reproductive: Unremarkable. Other: No free intraperitoneal air or abdominal hernia. Musculoskeletal: No acute or significant osseous findings. Review of the MIP images confirms the above findings. IMPRESSION: 1. No acute aortic syndrome. 2. Mild edema and subtle stranding about the pancreatic head. 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 Electronically Signed   By: Minerva Fester M.D.   On: 10/20/2022 02:52   CT Head Wo Contrast  Result Date: 10/20/2022 CLINICAL DATA:  Headache, neuro deficit EXAM: CT HEAD WITHOUT CONTRAST TECHNIQUE: Contiguous axial images were obtained from the base of the skull through the vertex without intravenous contrast. RADIATION DOSE REDUCTION: This exam was performed according to the departmental dose-optimization program which includes automated exposure control, adjustment of the mA and/or kV according to patient size and/or use of iterative reconstruction technique. COMPARISON:  None Available. FINDINGS: Brain: No evidence of acute infarction, hemorrhage, hydrocephalus, extra-axial collection or mass lesion/mass effect. Vascular: No hyperdense vessel or unexpected calcification. Skull: Normal. Negative for fracture or focal lesion. Sinuses/Orbits: No acute finding. Other: Mastoid air cells and middle ear cavities are clear IMPRESSION: 1. No acute intracranial abnormality. Electronically Signed   By: Helyn Numbers M.D.   On: 10/20/2022 02:30   DG Chest 2 View  Result Date: 10/20/2022 CLINICAL DATA:  Chest pain. EXAM: CHEST - 2 VIEW COMPARISON:  Sep 21, 2022 FINDINGS: The heart size and mediastinal contours are within normal limits. Both lungs are clear. The visualized skeletal structures are unremarkable. IMPRESSION: No  active cardiopulmonary disease. Electronically Signed   By: Aram Candela M.D.   On: 10/20/2022 00:48      Impression    Intractable nausea  and vomiting Lower back pain/Lower abdominal pain Type 2 Diabetes Cannabis abuse Acute on chronic pancreatitis (?) - no leukocytosis - Lipase 143 - CTA showing mild edema and stranding around pancreatic head - BUN 15, Cr. 1.46 - Glucose 236 - Negative IgG 08/2022 - Triglycerides 245 06/2021 Lipase mildly elevated with mild edema on CT. Clinically patient does not appear to have symptoms compatible with pancreatitis. IgG and triglyceride level not likely causing pancreatitis. Has not had alcohol in many years. History of Marijuana use, possible cannaboid hyperemesis syndrome(?)   Plan   - Recommend continued supportive care - Continue IVF - Continue anti-emetics as needed - Continue pain control - Recommend outpatient gastric emptying study with history of nausea/vomiting and uncontrolled diabetes and recommend cessation of marijuana use  Thank you for your kind consultation, we will continue to follow.   Bayley Leanna Sato  10/20/2022, 10:26 AM    Attending physician's note  I have taken a history, reviewed the chart and examined the patient. I performed a substantive portion of this encounter, including complete performance of at least one of the key components, in conjunction with the APP. I agree with the APP's note, impression and recommendations.    42 year old male with hypertension, hyperlipidemia, type 2 diabetes, daily marijuana use admitted with nausea, vomiting and abdominal pain.  He has cyclic vomiting/cannabis hyperemesis syndrome. Elevation in lipase/mild pancreatitis in the setting of severe dehydration with intractable nausea and vomiting due to hyperemesis cannabis syndrome Patient is abstaining from alcohol.  No evidence of biliary disease or significant hypertriglyceridemia.  IgG4 within normal range, no evidence of  autoimmune pancreatitis. Abdomen is soft with no tenderness or distention.  Discussed abstaining from marijuana use or limiting it Continue supportive care Antiemetics Slowly advance diet as tolerated, will start with clears.  The patient was provided an opportunity to ask questions and all were answered. The patient agreed with the plan and demonstrated an understanding of the instructions.  Iona Beard , MD [phone]

## 2022-10-20 NOTE — ED Provider Notes (Signed)
**Randy [name] De-Identified via Obfuscation** Randy Randy [name]   CSN: [id number] Arrival date & time: 10/19/22  2332     History  Chief Complaint  Patient presents with   Chest Pain    Randy Randy [name]. is a 42 y.o. male.  HPI   Patient with medical history including hypertension, hyperlipidemia, pancreatitis, type 2 diabetes, CKD, presenting with complaints of back pain nausea vomiting.  Patient states this started yesterday, states initially started with some lower back pain he then developed nausea and vomiting this worsened his pain, states that this feels like his normal pancreatitis but feels slightly worse.  He denies any coffee-ground emesis or bloody emesis still passing gas having normal bowel movements, he denies any chest pain or shortness of breath he denies back pain rating to his abdomen he denies a tearing or stabbing-like sensation.  He has no history of dissections, no family history of dissection aneurysms or connective tissue disorders.  Does Randy [name] that he has a slight headache, no change in vision no paresthesias or weakness of the lower extremities.  Patient states he has been compliant with his blood pressure medication denies any illicit drug use.    Home Medications Prior to Admission medications   Medication Sig Start Date End Date Taking? Authorizing Provider  amLODipine (NORVASC) 10 MG tablet Take 1 tablet (10 mg total) by mouth daily. 09/09/22   Hoy Register, MD  atorvastatin (LIPITOR) 80 MG tablet Take 1 tablet (80 mg total) by mouth daily. 07/18/22   Hoy Register, MD  carvedilol (COREG) 25 MG tablet Take 1 tablet (25 mg total) by mouth 2 (two) times daily with a meal. 09/09/22   Hoy Register, MD  Continuous Blood Gluc Receiver (FREESTYLE LIBRE 2 READER) DEVI Use to check blood glucose continuously. 07/18/22   Hoy Register, MD  Continuous Blood Gluc Sensor (FREESTYLE LIBRE 2 SENSOR) MISC Use to check blood glucose continuously. Change sensors  once every 14 days. 07/18/22   Hoy Register, MD  hydrALAZINE (APRESOLINE) 100 MG tablet Take 1 tablet (100 mg total) by mouth every 8 (eight) hours. Patient not taking: Reported on 09/21/2022 09/09/22   Hoy Register, MD  insulin aspart (NOVOLOG) 100 UNIT/ML FlexPen Inject 10 Units into the skin 3 (three) times daily with meals. 09/09/22   Hoy Register, MD  Insulin Glargine (BASAGLAR KWIKPEN) 100 UNIT/ML Inject 34 Units into the skin daily. 09/09/22   Hoy Register, MD  Insulin Pen Needle (TRUEPLUS 5-BEVEL PEN NEEDLES) 32G X 4 MM MISC Use to inject basaglar once daily. 09/09/22   Hoy Register, MD  Ipratropium-Albuterol (COMBIVENT) 20-100 MCG/ACT AERS respimat Inhale 1 puff into the lungs every 6 (six) hours as needed for wheezing or shortness of breath. 05/23/22   Marguerita Merles Latif, DO  pantoprazole (PROTONIX) 40 MG tablet Take 1 tablet (40 mg total) by mouth 2 (two) times daily. Patient not taking: Reported on 09/21/2022 09/09/22   Hoy Register, MD  thiamine (VITAMIN B-1) 100 MG tablet Take 1 tablet (100 mg total) by mouth daily. Patient not taking: Reported on 09/21/2022 05/24/22   Marguerita Merles Latif, DO  traMADol (ULTRAM) 50 MG tablet Take 1 tablet (50 mg total) by mouth every 6 (six) hours as needed. Patient not taking: Reported on 09/21/2022 05/23/22   Marguerita Merles Latif, DO      Allergies    Tomato    Review of Systems   Review of Systems  Constitutional:  Negative for chills and fever.  Respiratory:  Negative for shortness of breath.   Cardiovascular:  Negative for chest pain.  Gastrointestinal:  Positive for abdominal pain, nausea and vomiting.  Musculoskeletal:  Positive for back pain.  Neurological:  Negative for headaches.    Physical Exam Updated Vital Signs BP (!) 195/99   Pulse 83   Temp 97.8 F (36.6 C) (Oral)   Resp 16   Ht 5\' 8"  (1.727 m)   Wt 87.1 kg   SpO2 99%   BMI 29.19 kg/m  Physical Exam Vitals and nursing Randy [name] reviewed.  Constitutional:       General: He is not in acute distress.    Appearance: He is not ill-appearing.  HENT:     Head: Normocephalic and atraumatic.     Nose: No congestion.  Eyes:     Conjunctiva/sclera: Conjunctivae normal.  Cardiovascular:     Rate and Rhythm: Normal rate and regular rhythm.     Pulses: Normal pulses.     Heart sounds: No murmur heard.    No friction rub. No gallop.  Pulmonary:     Effort: No respiratory distress.     Breath sounds: No wheezing, rhonchi or rales.  Abdominal:     Tenderness: There is abdominal tenderness. There is no right CVA tenderness or left CVA tenderness.     Comments: Abdomen nondistended, soft, noted periumbilical/epigastric tenderness without guarding rebound is or peritoneal sign.  Musculoskeletal:     Comments: Spine was palpated nontender to palpation no step-off deformities noted, patient did have tenderness along peri thoracic spine pain was focalized reproducible.  Patient is moving his upper and lower extremities without difficulty.  Skin:    General: Skin is warm and dry.  Neurological:     Mental Status: He is alert.     Comments: No facial asymmetry no difficulty with word finding following two-step commands there is no unilateral weakness present.  Psychiatric:        Mood and Affect: Mood normal.     ED Results / Procedures / Treatments   Labs (all labs ordered are listed, but only abnormal results are displayed) Labs Reviewed  CBC - Abnormal; Notable for the following components:      Result Value   RBC 4.17 (*)    Hemoglobin 11.4 (*)    HCT 35.5 (*)    All other components within normal limits  LIPASE, BLOOD - Abnormal; Notable for the following components:   Lipase 143 (*)    All other components within normal limits  COMPREHENSIVE METABOLIC PANEL - Abnormal; Notable for the following components:   Sodium 134 (*)    Glucose, Bld 290 (*)    Creatinine, Ser 1.59 (*)    Calcium 8.8 (*)    Albumin 3.4 (*)    AST 47 (*)    Total  Bilirubin 1.7 (*)    GFR, Estimated 55 (*)    All other components within normal limits  URINALYSIS, ROUTINE W REFLEX MICROSCOPIC - Abnormal; Notable for the following components:   Color, Urine STRAW (*)    Glucose, UA >=500 (*)    Hgb urine dipstick SMALL (*)    Protein, ur 100 (*)    All other components within normal limits  CBG MONITORING, ED - Abnormal; Notable for the following components:   Glucose-Capillary 275 (*)    All other components within normal limits  TROPONIN I (HIGH SENSITIVITY) - Abnormal; Notable for the following components:   Troponin I (High Sensitivity) 18 (*)    All  other components within normal limits  TROPONIN I (HIGH SENSITIVITY) - Abnormal; Notable for the following components:   Troponin I (High Sensitivity) 20 (*)    All other components within normal limits  CBC  COMPREHENSIVE METABOLIC PANEL    EKG EKG Interpretation  Date/Time:  Saturday October 19 2022 23:41:07 EDT Ventricular Rate:  74 PR Interval:  180 QRS Duration: 92 QT Interval:  390 QTC Calculation: 432 R Axis:   26 Text Interpretation: Normal sinus rhythm Normal ECG When compared with ECG of 21-Sep-2022 18:22, PREVIOUS ECG IS PRESENT No significant change was found Confirmed by Glynn Octave [phone]) on 10/20/2022 12:46:55 AM  Radiology CT Angio Chest/Abd/Pel for Dissection W and/or Wo Contrast  Result Date: 10/20/2022 CLINICAL DATA:  Patient believes he is having a pancreas flare-up. Acute aortic syndrome suspected EXAM: CT ANGIOGRAPHY CHEST, ABDOMEN AND PELVIS TECHNIQUE: Non-contrast CT of the chest was initially obtained. Multidetector CT imaging through the chest, abdomen and pelvis was performed using the standard protocol during bolus administration of intravenous contrast. Multiplanar reconstructed images and MIPs were obtained and reviewed to evaluate the vascular anatomy. RADIATION DOSE REDUCTION: This exam was performed according to the departmental dose-optimization program  which includes automated exposure control, adjustment of the mA and/or kV according to patient size and/or use of iterative reconstruction technique. CONTRAST:  75mL OMNIPAQUE IOHEXOL 350 MG/ML SOLN COMPARISON:  Chest radiograph 10/20/2022; CTA chest abdomen and pelvis 09/12/2020 and CT abdomen and pelvis 05/20/2022 FINDINGS: CTA CHEST FINDINGS Cardiovascular: No acute aortic syndrome. Borderline cardiomegaly. No pericardial effusion. Mediastinum/Nodes: No enlarged mediastinal, hilar, or axillary lymph nodes. Thyroid gland, trachea, and esophagus demonstrate no significant findings. Lungs/Pleura: Lungs are clear. No pleural effusion or pneumothorax. Musculoskeletal: No chest wall abnormality. No acute or significant osseous findings. Review of the MIP images confirms the above findings. CTA ABDOMEN AND PELVIS FINDINGS VASCULAR Aorta: Normal caliber aorta without aneurysm, dissection, vasculitis or significant stenosis. Celiac: Patent without evidence of aneurysm, dissection, vasculitis or significant stenosis. SMA: Patent without evidence of aneurysm, dissection, vasculitis or significant stenosis. Renals: Both renal arteries are patent without evidence of aneurysm, dissection, vasculitis, fibromuscular dysplasia or significant stenosis. IMA: Patent without evidence of aneurysm, dissection, vasculitis or significant stenosis. Inflow: Patent without evidence of aneurysm, dissection, vasculitis or significant stenosis. Veins: No obvious venous abnormality within the limitations of this arterial phase study. Review of the MIP images confirms the above findings. NON-VASCULAR Hepatobiliary: No focal liver abnormality is seen. No gallstones, gallbladder wall thickening, or biliary dilatation. Pancreas: Mild edema and subtle stranding about the pancreatic head. No fluid collection. No ductal dilation. Spleen: Unremarkable. Adrenals/Urinary Tract: Normal adrenal glands. No urinary calculi or hydronephrosis. Unremarkable  bladder. Stomach/Bowel: Normal caliber large and small bowel. Normal appendix. No bowel wall thickening. Stomach is within normal limits. Lymphatic: No lymphadenopathy. Reproductive: Unremarkable. Other: No free intraperitoneal air or abdominal hernia. Musculoskeletal: No acute or significant osseous findings. Review of the MIP images confirms the above findings. IMPRESSION: 1. No acute aortic syndrome. 2. Mild edema and subtle stranding about the pancreatic head. 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 Electronically Signed   By: Minerva Fester M.D.   On: 10/20/2022 02:52   CT Head Wo Contrast  Result Date: 10/20/2022 CLINICAL DATA:  Headache, neuro deficit EXAM: CT HEAD WITHOUT CONTRAST TECHNIQUE: Contiguous axial images were obtained from the base of the skull through the vertex without intravenous contrast. RADIATION DOSE REDUCTION: This exam was performed according to the departmental dose-optimization program which  includes automated exposure control, adjustment of the mA and/or kV according to patient size and/or use of iterative reconstruction technique. COMPARISON:  None Available. FINDINGS: Brain: No evidence of acute infarction, hemorrhage, hydrocephalus, extra-axial collection or mass lesion/mass effect. Vascular: No hyperdense vessel or unexpected calcification. Skull: Normal. Negative for fracture or focal lesion. Sinuses/Orbits: No acute finding. Other: Mastoid air cells and middle ear cavities are clear IMPRESSION: 1. No acute intracranial abnormality. Electronically Signed   By: Helyn Numbers M.D.   On: 10/20/2022 02:30   DG Chest 2 View  Result Date: 10/20/2022 CLINICAL DATA:  Chest pain. EXAM: CHEST - 2 VIEW COMPARISON:  Sep 21, 2022 FINDINGS: The heart size and mediastinal contours are within normal limits. Both lungs are clear. The visualized skeletal structures are unremarkable. IMPRESSION: No active cardiopulmonary disease. Electronically Signed   By:  Aram Candela M.D.   On: 10/20/2022 00:48    Procedures Ultrasound ED Peripheral IV (Provider)  Date/Time: 10/20/2022 3:53 AM  Performed by: Carroll Sage, PA-C Authorized by: Carroll Sage, PA-C   Procedure details:    Indications: hydration, multiple failed IV attempts and poor IV access     Skin Prep: chlorhexidine gluconate     Location:  Left AC   Angiocath:  20 G   Bedside Ultrasound Guided: Yes     Images: archived     Patient tolerated procedure without complications: Yes     Dressing applied: Yes       Medications Ordered in ED Medications  hydrALAZINE (APRESOLINE) injection 5-10 mg (has no administration in time range)  amLODipine (NORVASC) tablet 10 mg (has no administration in time range)  carvedilol (COREG) tablet 25 mg (has no administration in time range)  atorvastatin (LIPITOR) tablet 80 mg (has no administration in time range)  pantoprazole (PROTONIX) EC tablet 40 mg (has no administration in time range)  traMADol (ULTRAM) tablet 50 mg (has no administration in time range)  ipratropium-albuterol (DUONEB) 0.5-2.5 (3) MG/3ML nebulizer solution 3 mL (has no administration in time range)  insulin glargine-yfgn (SEMGLEE) injection 34 Units (has no administration in time range)  insulin aspart (novoLOG) injection 0-9 Units (5 Units Subcutaneous Given 10/20/22 0518)  metoCLOPramide (REGLAN) injection 10 mg (10 mg Intravenous Given 10/20/22 0518)  lactated ringers infusion ( Intravenous New Bag/Given 10/20/22 0513)  enoxaparin (LOVENOX) injection 40 mg (has no administration in time range)  acetaminophen (TYLENOL) tablet 650 mg (has no administration in time range)    Or  acetaminophen (TYLENOL) suppository 650 mg (has no administration in time range)  HYDROmorphone (DILAUDID) injection 0.5 mg (has no administration in time range)  HYDROmorphone (DILAUDID) injection 1 mg (1 mg Intravenous Given 10/20/22 0232)  ondansetron (ZOFRAN) injection 4 mg (4 mg  Intravenous Given 10/20/22 0231)  iohexol (OMNIPAQUE) 350 MG/ML injection 75 mL (75 mLs Intravenous Contrast Given 10/20/22 0207)  hydrALAZINE (APRESOLINE) injection 5 mg (5 mg Intravenous Given 10/20/22 0315)  sodium chloride 0.9 % bolus 1,000 mL (0 mLs Intravenous Stopped 10/20/22 0513)    ED Course/ Medical Decision Making/ A&P                             Medical Decision Making Amount and/or Complexity of Data Reviewed Labs: ordered. Radiology: ordered.  Risk Prescription drug management.   This patient presents to the ED for concern of back pain nausea vomiting, this involves an extensive number of treatment options, and is a complaint that carries with  it a high risk of complications and morbidity.  The differential diagnosis includes dissection, AAA, kidney stone, pancreatitis,    Additional history obtained:  Additional history obtained from N/A External records from outside source obtained and reviewed including recent hospitalization notes   Co morbidities that complicate the patient evaluation  Pancreatitis, hypertension  Social Determinants of Health:  Polysubstance use    Lab Tests:  I Ordered, and personally interpreted labs.  The pertinent results include: CBC shows normocytic anemia hemoglobin 11.4, CMP reveals sodium 134, glucose 290, creatinine 1.59, calcium 8.8, AST 47, total T. bili 1.7, lipase 143 UA unremarkable, for troponin is 18-second troponin is 20   Imaging Studies ordered:  I ordered imaging studies including chest x-ray, CT head, CT angio chest abdomen pelvis I independently visualized and interpreted imaging which showed CT head negative, chest x-ray unremarkable, dissection study shows stranding around the pancreatic head concerning for pancreatitis I agree with the radiologist interpretation   Cardiac Monitoring:  The patient was maintained on a cardiac monitor.  I personally viewed and interpreted the cardiac monitored which showed an  underlying rhythm of: EKG without signs of ischemia   Medicines ordered and prescription drug management:  I ordered medication including intravenous fluids, hydralazine, Dilaudid, antiemetics I have reviewed the patients home medicines and have made adjustments as needed  Critical Interventions:  N/A   Reevaluation:  Presents with back pain and nausea vomiting, patient presents hypertensive, concern for possible dissection, will obtain screening lab workup sent down for CT dissection study for further evaluation  CT scan is negative for dissection, shows pancreatitis, patient remains hypertensive, will provide with antihypertensive medication.  Patient is reassessed resting comfortably BP has improved will admit to medicine for hypertensive emergency as well as pancreatitis  Consultations Obtained:  I requested consultation with the Dr. Lyda Perone,  and discussed lab and imaging findings as well as pertinent plan - they recommend: He will admit the patient    Test Considered:  N/A    Rule out low suspicion for internal head bleed and or mass as CT imaging is negative for acute findings.  Low suspicion for CVA she has no focal deficit present my exam.  Low suspicion for dissection of the vertebral or carotid artery as presentation atypical of etiology.  Suspicion for AAA, dissection, volvulus, bowel obstruction, diverticulitis, intra-abdominal mass/infection as of this time CT imaging is all negative these findings.  I doubt ACS denies any chest pain, EKG without signs of ischemia.  He does have a slightly elevated troponin but it is plateaued, I suspect likely secondary due to demand ischemia from hypertensive urgency.    Dispostion and problem list  After consideration of the diagnostic results and the patients response to treatment, I feel that the patent would benefit from admission.  Acute pancreatitis-will need continued fluids, pain medication, outpatient follow-up  with gastroenterology Hypertensive emergency-patient will need continued antihypertensive, stepdown to oral medications. Elevated troponins-likely in the settings of hypertensive urgency likely this resolved once hypertension is well-controlled.             Final Clinical Impression(s) / ED Diagnoses Final diagnoses:  Acute pancreatitis without infection or necrosis, unspecified pancreatitis type  Hypertensive urgency  Elevated troponin    Rx / DC Orders ED Discharge Orders     None         Carroll Sage, PA-C 10/20/22 0536    Glynn Octave, MD 10/20/22 [phone]

## 2022-10-20 NOTE — ED Notes (Signed)
ED TO INPATIENT HANDOFF REPORT  ED Nurse Name and Phone #:  Jaquelyn Bitter [phone]  S Name/Age/Gender Randy [name]. 42 y.o. male Room/Bed: 017C/017C  Code Status   Code Status: Full Code  Home/SNF/Other Home Patient oriented to: self, place, time, and situation Is this baseline? Yes   Triage Complete: Triage complete  Chief Complaint Acute on chronic pancreatitis (HCC) [K85.90, K86.1]  Triage Note Pt BIB [facility] from home d/t concerns for back pain, HTN, and chest pain.  Endorses lower back back pain. No injury/trauma. No leg numbness. C/o left sided chest pain, described as stabbing. Pain associated with NV and dizziness. No SOB. S/s feels like pancreatic flare up in the past, worsening in severity today.   Pt is legally blind.    Allergies Allergies  Allergen Reactions   Tomato Itching    Level of Care/Admitting Diagnosis ED Disposition     ED Disposition  Admit   Condition  --   Comment  Hospital Area: [facility] [[id number]]  Level of Care: Med-Surg [[id number]]  May place patient in observation at [facility] or [facility] if equivalent level of care is available:: No  Covid Evaluation: Asymptomatic - no recent exposure (last 10 days) testing not required  Diagnosis: Acute on chronic pancreatitis [facility]) [[id number]]  Admitting Physician: Hillary Bow [[id number]]  Attending Physician: Hillary Bow [[id number]]          B Medical/Surgery History Past Medical History:  Diagnosis Date   Essential hypertension 05/31/2019   HLD (hyperlipidemia)    Pancreatitis    Type II diabetes mellitus (HCC)    Past Surgical History:  Procedure Laterality Date   NO PAST SURGERIES       A IV Location/Drains/Wounds Patient Lines/Drains/Airways Status     Active Line/Drains/Airways     Name Placement date Placement time Site Days   Peripheral IV 10/20/22 20 G Anterior;Left;Upper Arm 10/20/22  0149  Arm  less than 1            Intake/Output Last 24  hours No intake or output data in the 24 hours ending 10/20/22 0519  Labs/Imaging Results for orders placed or performed during the hospital encounter of 10/19/22 (from the past 48 hour(s))  CBC     Status: Abnormal   Collection Time: 10/19/22 11:57 PM  Result Value Ref Range   WBC 6.4 4.0 - 10.5 K/uL   RBC 4.17 (L) 4.22 - 5.81 MIL/uL   Hemoglobin 11.4 (L) 13.0 - 17.0 g/dL   HCT 24.4 (L) 01.0 - 27.2 %   MCV 85.1 80.0 - 100.0 fL   MCH 27.3 26.0 - 34.0 pg   MCHC 32.1 30.0 - 36.0 g/dL   RDW 53.6 64.4 - 03.4 %   Platelets 251 150 - 400 K/uL   nRBC 0.0 0.0 - 0.2 %    Comment: Performed at [facility], [address], [place], [place] [address]  Troponin I (High Sensitivity)     Status: Abnormal   Collection Time: 10/19/22 11:57 PM  Result Value Ref Range   Troponin I (High Sensitivity) 18 (H) <18 ng/L    Comment: (NOTE) Elevated high sensitivity troponin I (hsTnI) values and significant  changes across serial measurements may suggest ACS but many other  chronic and acute conditions are known to elevate hsTnI results.  Refer to the "Links" section for chest pain algorithms and additional  guidance. Performed at [facility], [address] [place],  [place] [address]   Lipase, blood     Status: Abnormal   Collection Time: 10/19/22 11:57 PM  Result Value Ref Range   Lipase 143 (H) 11 - 51 U/L    Comment: HEMOLYSIS AT THIS LEVEL MAY AFFECT RESULT Performed at [facility], [address] [place], [place] [address]   Comprehensive metabolic panel     Status: Abnormal   Collection Time: 10/19/22 11:57 PM  Result Value Ref Range   Sodium 134 (L) 135 - 145 mmol/L   Potassium 5.0 3.5 - 5.1 mmol/L    Comment: HEMOLYSIS AT THIS LEVEL MAY AFFECT RESULT   Chloride 100 98 - 111 mmol/L   CO2 23 22 - 32 mmol/L   Glucose, Bld 290 (H) 70 - 99 mg/dL    Comment: Glucose reference range applies only to samples taken after fasting for at least 8 hours.   BUN 18 6 - 20 mg/dL    Creatinine, Ser 0.98 (H) 0.61 - 1.24 mg/dL   Calcium 8.8 (L) 8.9 - 10.3 mg/dL   Total Protein 6.8 6.5 - 8.1 g/dL   Albumin 3.4 (L) 3.5 - 5.0 g/dL   AST 47 (H) 15 - 41 U/L    Comment: HEMOLYSIS AT THIS LEVEL MAY AFFECT RESULT   ALT 21 0 - 44 U/L    Comment: HEMOLYSIS AT THIS LEVEL MAY AFFECT RESULT   Alkaline Phosphatase 104 38 - 126 U/L    Comment: HEMOLYSIS AT THIS LEVEL MAY AFFECT RESULT   Total Bilirubin 1.7 (H) 0.3 - 1.2 mg/dL    Comment: HEMOLYSIS AT THIS LEVEL MAY AFFECT RESULT   GFR, Estimated 55 (L) >60 mL/min    Comment: (NOTE) Calculated using the CKD-EPI Creatinine Equation (2021)    Anion gap 11 5 - 15    Comment: Performed at [facility], [address], [place], [place] [address]  Troponin I (High Sensitivity)     Status: Abnormal   Collection Time: 10/20/22  1:50 AM  Result Value Ref Range   Troponin I (High Sensitivity) 20 (H) <18 ng/L    Comment: (NOTE) Elevated high sensitivity troponin I (hsTnI) values and significant  changes across serial measurements may suggest ACS but many other  chronic and acute conditions are known to elevate hsTnI results.  Refer to the "Links" section for chest pain algorithms and additional  guidance. Performed at [facility], [address], [place], [place] [address]   Urinalysis, Routine w reflex microscopic -Urine, Clean Catch     Status: Abnormal   Collection Time: 10/20/22  2:30 AM  Result Value Ref Range   Color, Urine STRAW (A) YELLOW   APPearance CLEAR CLEAR   Specific Gravity, Urine 1.013 1.005 - 1.030   pH 7.0 5.0 - 8.0   Glucose, UA >=500 (A) NEGATIVE mg/dL   Hgb urine dipstick SMALL (A) NEGATIVE   Bilirubin Urine NEGATIVE NEGATIVE   Ketones, ur NEGATIVE NEGATIVE mg/dL   Protein, ur 621 (A) NEGATIVE mg/dL   Nitrite NEGATIVE NEGATIVE   Leukocytes,Ua NEGATIVE NEGATIVE   RBC / HPF 0-5 0 - 5 RBC/hpf   WBC, UA 0-5 0 - 5 WBC/hpf   Bacteria, UA NONE SEEN NONE SEEN   Squamous Epithelial / HPF 0-5 0 - 5  /HPF   Hyaline Casts, UA PRESENT     Comment: Performed at [facility], [address] [place], [place] [address]  CBG monitoring, ED     Status: Abnormal  Collection Time: 10/20/22  5:10 AM  Result Value Ref Range   Glucose-Capillary 275 (H) 70 - 99 mg/dL    Comment: Glucose reference range applies only to samples taken after fasting for at least 8 hours.   CT Angio Chest/Abd/Pel for Dissection W and/or Wo Contrast  Result Date: 10/20/2022 CLINICAL DATA:  Patient believes he is having a pancreas flare-up. Acute aortic syndrome suspected EXAM: CT ANGIOGRAPHY CHEST, ABDOMEN AND PELVIS TECHNIQUE: Non-contrast CT of the chest was initially obtained. Multidetector CT imaging through the chest, abdomen and pelvis was performed using the standard protocol during bolus administration of intravenous contrast. Multiplanar reconstructed images and MIPs were obtained and reviewed to evaluate the vascular anatomy. RADIATION DOSE REDUCTION: This exam was performed according to the departmental dose-optimization program which includes automated exposure control, adjustment of the mA and/or kV according to patient size and/or use of iterative reconstruction technique. CONTRAST:  75mL OMNIPAQUE IOHEXOL 350 MG/ML SOLN COMPARISON:  Chest radiograph 10/20/2022; CTA chest abdomen and pelvis 09/12/2020 and CT abdomen and pelvis 05/20/2022 FINDINGS: CTA CHEST FINDINGS Cardiovascular: No acute aortic syndrome. Borderline cardiomegaly. No pericardial effusion. Mediastinum/Nodes: No enlarged mediastinal, hilar, or axillary lymph nodes. Thyroid gland, trachea, and esophagus demonstrate no significant findings. Lungs/Pleura: Lungs are clear. No pleural effusion or pneumothorax. Musculoskeletal: No chest wall abnormality. No acute or significant osseous findings. Review of the MIP images confirms the above findings. CTA ABDOMEN AND PELVIS FINDINGS VASCULAR Aorta: Normal caliber aorta without aneurysm, dissection, vasculitis  or significant stenosis. Celiac: Patent without evidence of aneurysm, dissection, vasculitis or significant stenosis. SMA: Patent without evidence of aneurysm, dissection, vasculitis or significant stenosis. Renals: Both renal arteries are patent without evidence of aneurysm, dissection, vasculitis, fibromuscular dysplasia or significant stenosis. IMA: Patent without evidence of aneurysm, dissection, vasculitis or significant stenosis. Inflow: Patent without evidence of aneurysm, dissection, vasculitis or significant stenosis. Veins: No obvious venous abnormality within the limitations of this arterial phase study. Review of the MIP images confirms the above findings. NON-VASCULAR Hepatobiliary: No focal liver abnormality is seen. No gallstones, gallbladder wall thickening, or biliary dilatation. Pancreas: Mild edema and subtle stranding about the pancreatic head. No fluid collection. No ductal dilation. Spleen: Unremarkable. Adrenals/Urinary Tract: Normal adrenal glands. No urinary calculi or hydronephrosis. Unremarkable bladder. Stomach/Bowel: Normal caliber large and small bowel. Normal appendix. No bowel wall thickening. Stomach is within normal limits. Lymphatic: No lymphadenopathy. Reproductive: Unremarkable. Other: No free intraperitoneal air or abdominal hernia. Musculoskeletal: No acute or significant osseous findings. Review of the MIP images confirms the above findings. IMPRESSION: 1. No acute aortic syndrome. 2. Mild edema and subtle stranding about the pancreatic head. 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 Electronically Signed   By: Minerva Fester M.D.   On: 10/20/2022 02:52   CT Head Wo Contrast  Result Date: 10/20/2022 CLINICAL DATA:  Headache, neuro deficit EXAM: CT HEAD WITHOUT CONTRAST TECHNIQUE: Contiguous axial images were obtained from the base of the skull through the vertex without intravenous contrast. RADIATION DOSE REDUCTION: This exam was performed  according to the departmental dose-optimization program which includes automated exposure control, adjustment of the mA and/or kV according to patient size and/or use of iterative reconstruction technique. COMPARISON:  None Available. FINDINGS: Brain: No evidence of acute infarction, hemorrhage, hydrocephalus, extra-axial collection or mass lesion/mass effect. Vascular: No hyperdense vessel or unexpected calcification. Skull: Normal. Negative for fracture or focal lesion. Sinuses/Orbits: No acute finding. Other: Mastoid air cells and middle ear cavities are clear IMPRESSION: 1. No  acute intracranial abnormality. Electronically Signed   By: Helyn Numbers M.D.   On: 10/20/2022 02:30   DG Chest 2 View  Result Date: 10/20/2022 CLINICAL DATA:  Chest pain. EXAM: CHEST - 2 VIEW COMPARISON:  Sep 21, 2022 FINDINGS: The heart size and mediastinal contours are within normal limits. Both lungs are clear. The visualized skeletal structures are unremarkable. IMPRESSION: No active cardiopulmonary disease. Electronically Signed   By: Aram Candela M.D.   On: 10/20/2022 00:48    Pending Labs Unresulted Labs (From admission, onward)     Start     Ordered   10/20/22 0500  CBC  Tomorrow morning,   R        10/20/22 0500   10/20/22 0500  Comprehensive metabolic panel  Tomorrow morning,   R        10/20/22 0500            Vitals/Pain Today's Vitals   10/20/22 0400 10/20/22 0430 10/20/22 0500 10/20/22 0502  BP: (!) 168/109 (!) 182/93 (!) 195/99   Pulse: 93 91 83   Resp:   16   Temp:   97.8 F (36.6 C)   TempSrc:   Oral   SpO2: 97% 98% 99%   Weight:      Height:      PainSc:    10-Worst pain ever    Isolation Precautions No active isolations  Medications Medications  hydrALAZINE (APRESOLINE) injection 5-10 mg (has no administration in time range)  amLODipine (NORVASC) tablet 10 mg (has no administration in time range)  carvedilol (COREG) tablet 25 mg (has no administration in time range)   atorvastatin (LIPITOR) tablet 80 mg (has no administration in time range)  pantoprazole (PROTONIX) EC tablet 40 mg (has no administration in time range)  traMADol (ULTRAM) tablet 50 mg (has no administration in time range)  ipratropium-albuterol (DUONEB) 0.5-2.5 (3) MG/3ML nebulizer solution 3 mL (has no administration in time range)  insulin glargine-yfgn (SEMGLEE) injection 34 Units (has no administration in time range)  insulin aspart (novoLOG) injection 0-9 Units (5 Units Subcutaneous Given 10/20/22 0518)  metoCLOPramide (REGLAN) injection 10 mg (10 mg Intravenous Given 10/20/22 0518)  lactated ringers infusion ( Intravenous New Bag/Given 10/20/22 0513)  enoxaparin (LOVENOX) injection 40 mg (has no administration in time range)  acetaminophen (TYLENOL) tablet 650 mg (has no administration in time range)    Or  acetaminophen (TYLENOL) suppository 650 mg (has no administration in time range)  HYDROmorphone (DILAUDID) injection 0.5 mg (has no administration in time range)  HYDROmorphone (DILAUDID) injection 1 mg (1 mg Intravenous Given 10/20/22 0232)  ondansetron (ZOFRAN) injection 4 mg (4 mg Intravenous Given 10/20/22 0231)  iohexol (OMNIPAQUE) 350 MG/ML injection 75 mL (75 mLs Intravenous Contrast Given 10/20/22 0207)  hydrALAZINE (APRESOLINE) injection 5 mg (5 mg Intravenous Given 10/20/22 0315)  sodium chloride 0.9 % bolus 1,000 mL (0 mLs Intravenous Stopped 10/20/22 0513)    Mobility walks     Focused Assessments Cardiac Assessment Handoff:    Lab Results  Component Value Date   CKTOTAL 126 02/06/2013   TROPONINI <0.03 10/01/2016   Lab Results  Component Value Date   DDIMER 0.32 05/23/2022   Does the Patient currently have chest pain? No    R Recommendations: See Admitting Provider Note  Report given to:   Additional Notes: A&Ox4, ambulatory, using urinal, has PRN pain meds and PRN BP meds.

## 2022-10-20 NOTE — H&P (Signed)
History and Physical    Patient: Randy [name]. UJW:[id number] DOB: 12-Apr-1981 DOA: 10/19/2022 DOS: the patient was seen and examined on 10/20/2022 PCP: Georganna Skeans, MD  Patient coming from: Home  Chief Complaint:  Chief Complaint  Patient presents with   Chest Pain   HPI: Randy [name]. is a 42 y.o. male with medical history significant of HTN, HLD, DM2.  Pt well known to our service, gets admitted about once a month with abd pain, nausea, vomiting.  Has h/o recurrent pancreatitis.  Also suspected to have some combination of diabetic gastroparesis and cannabinoid hyperemesis syndrome.  Today pt presents with his usual symptoms of epigastric abd pain with radiation to back.  N/V today.  No hematemesis.  Symptoms similar to prior pancreatitis bouts.  Patient states he has been compliant with his blood pressure medication denies any illicit drug use.   Does note that he has a slight headache, no change in vision no paresthesias or weakness of the lower extremities.    Review of Systems: As mentioned in the history of present illness. All other systems reviewed and are negative. Past Medical History:  Diagnosis Date   Essential hypertension 05/31/2019   HLD (hyperlipidemia)    Pancreatitis    Type II diabetes mellitus (HCC)    Past Surgical History:  Procedure Laterality Date   NO PAST SURGERIES     Social History:  reports that he quit smoking about 10 years ago. His smoking use included cigarettes. He has a 5.61 pack-year smoking history. He has been exposed to tobacco smoke. He has never used smokeless tobacco. He reports that he does not currently use drugs after having used the following drugs: Marijuana. He reports that he does not drink alcohol.  Allergies  Allergen Reactions   Tomato Itching    Family History  Problem Relation Age of Onset   Hypertension Mother    Diabetes Father    Hypertension Father    Diabetes Brother    Diabetes Maternal  Grandmother    Colon cancer Neg Hx    Esophageal cancer Neg Hx    Rectal cancer Neg Hx    Stomach cancer Neg Hx     Prior to Admission medications   Medication Sig Start Date End Date Taking? Authorizing Provider  amLODipine (NORVASC) 10 MG tablet Take 1 tablet (10 mg total) by mouth daily. 09/09/22   Hoy Register, MD  atorvastatin (LIPITOR) 80 MG tablet Take 1 tablet (80 mg total) by mouth daily. 07/18/22   Hoy Register, MD  carvedilol (COREG) 25 MG tablet Take 1 tablet (25 mg total) by mouth 2 (two) times daily with a meal. 09/09/22   Hoy Register, MD  Continuous Blood Gluc Receiver (FREESTYLE LIBRE 2 READER) DEVI Use to check blood glucose continuously. 07/18/22   Hoy Register, MD  Continuous Blood Gluc Sensor (FREESTYLE LIBRE 2 SENSOR) MISC Use to check blood glucose continuously. Change sensors once every 14 days. 07/18/22   Hoy Register, MD  hydrALAZINE (APRESOLINE) 100 MG tablet Take 1 tablet (100 mg total) by mouth every 8 (eight) hours. Patient not taking: Reported on 09/21/2022 09/09/22   Hoy Register, MD  insulin aspart (NOVOLOG) 100 UNIT/ML FlexPen Inject 10 Units into the skin 3 (three) times daily with meals. 09/09/22   Hoy Register, MD  Insulin Glargine (BASAGLAR KWIKPEN) 100 UNIT/ML Inject 34 Units into the skin daily. 09/09/22   Hoy Register, MD  Insulin Pen Needle (TRUEPLUS 5-BEVEL PEN NEEDLES) 32G X 4 MM  MISC Use to inject basaglar once daily. 09/09/22   Hoy Register, MD  Ipratropium-Albuterol (COMBIVENT) 20-100 MCG/ACT AERS respimat Inhale 1 puff into the lungs every 6 (six) hours as needed for wheezing or shortness of breath. 05/23/22   Marguerita Merles Latif, DO  pantoprazole (PROTONIX) 40 MG tablet Take 1 tablet (40 mg total) by mouth 2 (two) times daily. Patient not taking: Reported on 09/21/2022 09/09/22   Hoy Register, MD  thiamine (VITAMIN B-1) 100 MG tablet Take 1 tablet (100 mg total) by mouth daily. Patient not taking: Reported on 09/21/2022 05/24/22    Marguerita Merles Latif, DO  traMADol (ULTRAM) 50 MG tablet Take 1 tablet (50 mg total) by mouth every 6 (six) hours as needed. 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 Normal respiratory effort. No accessory muscle use.  Cardiovascular: Regular rate and rhythm, no murmurs / rubs / gallops. No extremity edema. 2+ pedal pulses. No carotid bruits.  Abdomen: Epigastric TTP without guarding or rebound Neurologic: CN 2-12 grossly intact. Sensation intact, DTR normal. Strength 5/5 in all 4.  Psychiatric: Normal judgment and insight. Alert and oriented x 3. Normal mood.   Data Reviewed:    Labs on Admission: I have personally reviewed following labs and imaging studies  CBC: Recent Labs  Lab 10/19/22 2357  WBC 6.4  HGB 11.4*  HCT 35.5*  MCV 85.1  PLT 251   Basic Metabolic Panel: Recent Labs  Lab 10/19/22 2357  NA 134*  K 5.0  CL 100  CO2 23  GLUCOSE 290*  BUN 18  CREATININE 1.59*  CALCIUM 8.8*   GFR: Estimated Creatinine Clearance: 64.3 mL/min (A) (by C-G formula based on SCr of 1.59 mg/dL (H)). Liver Function Tests: Recent Labs  Lab 10/19/22 2357  AST 47*  ALT 21  ALKPHOS 104  BILITOT 1.7*  PROT 6.8  ALBUMIN 3.4*   Recent Labs  Lab 10/19/22 2357  LIPASE 143*   No results for input(s): "AMMONIA" in the last 168 hours. Coagulation Profile: No results for input(s): "INR", "PROTIME" in the last 168 hours. Cardiac Enzymes: No results for input(s): "CKTOTAL", "CKMB", "CKMBINDEX",  "TROPONINI" in the last 168 hours. BNP (last 3 results) No results for input(s): "PROBNP" in the last 8760 hours. HbA1C: No results for input(s): "HGBA1C" in the last 72 hours. CBG: No results for input(s): "GLUCAP" in the last 168 hours. Lipid Profile: No results for input(s): "CHOL", "HDL", "LDLCALC", "TRIG", "CHOLHDL", "LDLDIRECT" in the last 72 hours. Thyroid Function Tests: No results for input(s): "TSH", "T4TOTAL", "FREET4", "T3FREE", "THYROIDAB" in the last 72 hours. Anemia Panel: No results for input(s): "VITAMINB12", "FOLATE", "FERRITIN", "TIBC", "IRON", "RETICCTPCT" in the last 72 hours. Urine analysis:    Component Value Date/Time   COLORURINE STRAW (A) 10/20/2022 0230   APPEARANCEUR CLEAR 10/20/2022 0230   LABSPEC 1.013 10/20/2022 0230   PHURINE 7.0 10/20/2022 0230   GLUCOSEU >=500 (  A) 10/20/2022 0230   HGBUR SMALL (A) 10/20/2022 0230   BILIRUBINUR NEGATIVE 10/20/2022 0230   KETONESUR NEGATIVE 10/20/2022 0230   PROTEINUR 100 (A) 10/20/2022 0230   UROBILINOGEN 1.0 10/19/2013 2130   NITRITE NEGATIVE 10/20/2022 0230   LEUKOCYTESUR NEGATIVE 10/20/2022 0230    Radiological Exams on Admission: CT Angio Chest/Abd/Pel for Dissection W and/or Wo Contrast  Result Date: 10/20/2022 CLINICAL DATA:  Patient believes he is having a pancreas flare-up. Acute aortic syndrome suspected EXAM: CT ANGIOGRAPHY CHEST, ABDOMEN AND PELVIS TECHNIQUE: Non-contrast CT of the chest was initially obtained. Multidetector CT imaging through the chest, abdomen and pelvis was performed using the standard protocol during bolus administration of intravenous contrast. Multiplanar reconstructed images and MIPs were obtained and reviewed to evaluate the vascular anatomy. RADIATION DOSE REDUCTION: This exam was performed according to the departmental dose-optimization program which includes automated exposure control, adjustment of the mA and/or kV according to patient size and/or use of iterative reconstruction  technique. CONTRAST:  75mL OMNIPAQUE IOHEXOL 350 MG/ML SOLN COMPARISON:  Chest radiograph 10/20/2022; CTA chest abdomen and pelvis 09/12/2020 and CT abdomen and pelvis 05/20/2022 FINDINGS: CTA CHEST FINDINGS Cardiovascular: No acute aortic syndrome. Borderline cardiomegaly. No pericardial effusion. Mediastinum/Nodes: No enlarged mediastinal, hilar, or axillary lymph nodes. Thyroid gland, trachea, and esophagus demonstrate no significant findings. Lungs/Pleura: Lungs are clear. No pleural effusion or pneumothorax. Musculoskeletal: No chest wall abnormality. No acute or significant osseous findings. Review of the MIP images confirms the above findings. CTA ABDOMEN AND PELVIS FINDINGS VASCULAR Aorta: Normal caliber aorta without aneurysm, dissection, vasculitis or significant stenosis. Celiac: Patent without evidence of aneurysm, dissection, vasculitis or significant stenosis. SMA: Patent without evidence of aneurysm, dissection, vasculitis or significant stenosis. Renals: Both renal arteries are patent without evidence of aneurysm, dissection, vasculitis, fibromuscular dysplasia or significant stenosis. IMA: Patent without evidence of aneurysm, dissection, vasculitis or significant stenosis. Inflow: Patent without evidence of aneurysm, dissection, vasculitis or significant stenosis. Veins: No obvious venous abnormality within the limitations of this arterial phase study. Review of the MIP images confirms the above findings. NON-VASCULAR Hepatobiliary: No focal liver abnormality is seen. No gallstones, gallbladder wall thickening, or biliary dilatation. Pancreas: Mild edema and subtle stranding about the pancreatic head. No fluid collection. No ductal dilation. Spleen: Unremarkable. Adrenals/Urinary Tract: Normal adrenal glands. No urinary calculi or hydronephrosis. Unremarkable bladder. Stomach/Bowel: Normal caliber large and small bowel. Normal appendix. No bowel wall thickening. Stomach is within normal limits.  Lymphatic: No lymphadenopathy. Reproductive: Unremarkable. Other: No free intraperitoneal air or abdominal hernia. Musculoskeletal: No acute or significant osseous findings. Review of the MIP images confirms the above findings. IMPRESSION: 1. No acute aortic syndrome. 2. Mild edema and subtle stranding about the pancreatic head. 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 Electronically Signed   By: Minerva Fester M.D.   On: 10/20/2022 02:52   CT Head Wo Contrast  Result Date: 10/20/2022 CLINICAL DATA:  Headache, neuro deficit EXAM: CT HEAD WITHOUT CONTRAST TECHNIQUE: Contiguous axial images were obtained from the base of the skull through the vertex without intravenous contrast. RADIATION DOSE REDUCTION: This exam was performed according to the departmental dose-optimization program which includes automated exposure control, adjustment of the mA and/or kV according to patient size and/or use of iterative reconstruction technique. COMPARISON:  None Available. FINDINGS: Brain: No evidence of acute infarction, hemorrhage, hydrocephalus, extra-axial collection or mass lesion/mass effect. Vascular: No hyperdense vessel or unexpected calcification. Skull: Normal. Negative for fracture or focal lesion. Sinuses/Orbits: No acute finding.  Other: Mastoid air cells and middle ear cavities are clear IMPRESSION: 1. No acute intracranial abnormality. Electronically Signed   By: Helyn Numbers M.D.   On: 10/20/2022 02:30   DG Chest 2 View  Result Date: 10/20/2022 CLINICAL DATA:  Chest pain. EXAM: CHEST - 2 VIEW COMPARISON:  Sep 21, 2022 FINDINGS: The heart size and mediastinal contours are within normal limits. Both lungs are clear. The visualized skeletal structures are unremarkable. IMPRESSION: No active cardiopulmonary disease. Electronically Signed   By: Aram Candela M.D.   On: 10/20/2022 00:48    EKG: Independently reviewed.   Assessment and Plan: * Intractable nausea and  vomiting Nausea, vomiting, and abd pain. Pt in about once a month with same, due to some combination of: 1) acute on chronic pancreatitis 2) diabetic gastroparesis and 3) cannabinoid hyperemesis syndrome.  Scheduled reglan Got dilaudid in ED, will continue low dose PRN for pain for the moment since he does seem to have mild acute pancreatitis on CT today and lipase elevation Check UDS to see if still has THC on board, as noted previously: I wish hed quit using this just for a couple of months so we can see how much this is contributing... NPO IVF  Cannabis abuse Check UDS to see if still abusing.  Essential hypertension Cont home Norvasc, Coreg. Add PRN IV hydralazine.  Type 2 diabetes mellitus with hyperglycemia, with long-term current use of insulin (HCC) Cont home Semglee 35u daily Hold home Novolog Add sensitive SSI Q4H      Advance Care Planning:   Code Status: Full Code   Consults: Sending message to [facility] for consult.  Family Communication: No family in room  Severity of Illness: The appropriate patient status for this patient is OBSERVATION. Observation status is judged to be reasonable and necessary in order to provide the required intensity of service to ensure the patient's safety. The patient's presenting symptoms, physical exam findings, and initial radiographic and laboratory data in the context of their medical condition is felt to place them at decreased risk for further clinical deterioration. Furthermore, it is anticipated that the patient will be medically stable for discharge from the hospital within 2 midnights of admission.   Author: Hillary Bow., DO 10/20/2022 5:00 AM  For on call review [email or web address].

## 2022-10-20 NOTE — Assessment & Plan Note (Signed)
Cont home Norvasc, Coreg. Add PRN IV hydralazine.

## 2022-10-20 NOTE — ED Notes (Signed)
Pt remains in EMS triage. Requested room from charge RN Thayer Ohm

## 2022-10-20 NOTE — Assessment & Plan Note (Signed)
Getting GI consult for AM: not clear why he keeps getting recurrent pancreatitis.

## 2022-10-20 NOTE — Assessment & Plan Note (Signed)
Check UDS to see if still abusing.

## 2022-10-20 NOTE — Progress Notes (Signed)
Patient arrived on unit. VS and assessment complete and pt placed on tele. BP 206/124 noted to be elevated and trending up since last dose of hydralazine while in ED. Received confirmation from provider to give prn IV hydralazine now.

## 2022-10-20 NOTE — Assessment & Plan Note (Addendum)
Glucose labile - Continue semglee - Ciontinue SS correction - Continue Lipitor

## 2022-10-20 NOTE — Progress Notes (Signed)
[facility] PROGRESS NOTE   Randy [name]. EXB:[id number] DOB: 1980/06/29 DOA: 10/19/2022  PCP: Georganna Skeans, MD  Brief History/Interval Summary: 41 y.o. male with medical history significant of HTN, HLD, DM2. Pt well known to our service, gets admitted about once a month with abd pain, nausea, vomiting.  Has h/o recurrent pancreatitis.  Also suspected to have some combination of diabetic gastroparesis and cannabinoid hyperemesis syndrome.  Presented with abdominal pain.  Found to have elevated lipase level and CT findings suggestive of pancreatitis.  He was hospitalized for further management.  Consultants: Gastroenterology  Procedures: None yet    Subjective/Interval History: Patient complains of 8 out of 10 abdominal pain in the upper abdomen.  Denies any chest pain or shortness of breath.  No headache.  Did vomit earlier this morning.  Denies any blood in the emesis.    Assessment/Plan:  Acute pancreatitis Lipase level was elevated at 143.  CT raised concern for pancreatitis as well.  Etiology for his pancreatitis is not clear.  His LFTs unremarkable this morning.  No abnormality noted in his hepatobiliary system on CT scan.  He denies alcohol use. Gastroenterology has been consulted to assist with management.  Hypertensive urgency Blood pressure noted to be quite elevated.  Likely a combination of his known hypertension as well as elevated blood pressure from acute pain issues.  Will had hydralazine to his regimen.  He should be given all of his a.m. medications now.  Prior to admission he was on amlodipine, carvedilol and hydralazine.  May need to use clonidine as well.  Intractable nausea and vomiting/diabetic gastroparesis/cannabinoid hyperemesis syndrome Symptomatic treatment for now.  Wait on gastroenterology input.  He has been started on metoclopramide.  Diabetes mellitus type 2 with hyperglycemia Continue glargine and SSI.  Monitor CBGs.  Adjust insulin  dose as indicated.  HbA1c was 9.8 in February.  Chronic kidney disease stage IIIa Continue with IV hydration.  Monitor urine output.  Seems to be around his baseline.   Normocytic anemia Hemoglobin close to baseline.  No evidence for overt bleeding.   DVT Prophylaxis: Lovenox Code Status: Full code Family Communication: Discussed with patient Disposition Plan: Hopefully return home when improved  Status is: Observation The patient will require care spanning > 2 midnights and should be moved to inpatient because: Intractable nausea vomiting      Medications: Scheduled:  amLODipine  10 mg Oral Daily   atorvastatin  80 mg Oral Daily   carvedilol  25 mg Oral BID WC   enoxaparin (LOVENOX) injection  40 mg Subcutaneous Q24H   hydrALAZINE  50 mg Oral Q8H   insulin aspart  0-9 Units Subcutaneous Q4H   insulin glargine-yfgn  34 Units Subcutaneous Daily   metoCLOPramide (REGLAN) injection  10 mg Intravenous Q6H   pantoprazole  40 mg Oral BID   Continuous:  lactated ringers 100 mL/hr at 10/20/22 0513   Randy [name] **OR** acetaminophen, hydrALAZINE, HYDROmorphone (DILAUDID) injection, ipratropium-albuterol, traMADol  Antibiotics: Anti-infectives (From admission, onward)    None       Objective:  Vital Signs  Vitals:   10/20/22 0549 10/20/22 0649 10/20/22 0800 10/20/22 0812  BP: (!) 206/124 (!) 202/115 (!) 211/131 (!) 211/131  Pulse: 89 97 100 100  Resp: 15     Temp: 97.8 F (36.6 C)  97.7 F (36.5 C)   TempSrc:   Oral   SpO2: 99% 99% 96% 96%  Weight:      Height:  No intake or output data in the 24 hours ending 10/20/22 0919 Filed Weights   10/19/22 2336  Weight: 87.1 kg    General appearance: Awake alert.  In no distress Resp: Clear to auscultation bilaterally.  Normal effort Cardio: S1-S2 is normal regular.  No S3-S4.  No rubs murmurs or bruit GI: Abdomen is soft.  Tender in the epigastric area without any rebound rigidity or guarding.  No  tenderness in the right upper quadrant specifically.  Bowel sounds sluggish.  No masses organomegaly. Extremities: No edema.  Full range of motion of lower extremities. Neurologic: Alert and oriented x3.  No focal neurological deficits.    Lab Results:  Data Reviewed: I have personally reviewed following labs and reports of the imaging studies  CBC: Recent Labs  Lab 10/19/22 2357 10/20/22 0512  WBC 6.4 6.6  HGB 11.4* 10.2*  HCT 35.5* 31.8*  MCV 85.1 84.4  PLT 251 304    Basic Metabolic Panel: Recent Labs  Lab 10/19/22 2357 10/20/22 0512  NA 134* 135  K 5.0 3.7  CL 100 103  CO2 23 25  GLUCOSE 290* 271*  BUN 18 15  CREATININE 1.59* 1.46*  CALCIUM 8.8* 8.4*    GFR: Estimated Creatinine Clearance: 70 mL/min (A) (by C-G formula based on SCr of 1.46 mg/dL (H)).  Liver Function Tests: Recent Labs  Lab 10/19/22 2357 10/20/22 0512  AST 47* 18  ALT 21 18  ALKPHOS 104 103  BILITOT 1.7* 0.6  PROT 6.8 6.1*  ALBUMIN 3.4* 3.0*    Recent Labs  Lab 10/19/22 2357  LIPASE 143*   CBG: Recent Labs  Lab 10/20/22 0510 10/20/22 0816  GLUCAP 275* 246*      Radiology Studies: CT Angio Chest/Abd/Pel for Dissection W and/or Wo Contrast  Result Date: 10/20/2022 CLINICAL DATA:  Patient believes he is having a pancreas flare-up. Acute aortic syndrome suspected EXAM: CT ANGIOGRAPHY CHEST, ABDOMEN AND PELVIS TECHNIQUE: Non-contrast CT of the chest was initially obtained. Multidetector CT imaging through the chest, abdomen and pelvis was performed using the standard protocol during bolus administration of intravenous contrast. Multiplanar reconstructed images and MIPs were obtained and reviewed to evaluate the vascular anatomy. RADIATION DOSE REDUCTION: This exam was performed according to the departmental dose-optimization program which includes automated exposure control, adjustment of the mA and/or kV according to patient size and/or use of iterative reconstruction technique.  CONTRAST:  75mL OMNIPAQUE IOHEXOL 350 MG/ML SOLN COMPARISON:  Chest radiograph 10/20/2022; CTA chest abdomen and pelvis 09/12/2020 and CT abdomen and pelvis 05/20/2022 FINDINGS: CTA CHEST FINDINGS Cardiovascular: No acute aortic syndrome. Borderline cardiomegaly. No pericardial effusion. Mediastinum/Nodes: No enlarged mediastinal, hilar, or axillary lymph nodes. Thyroid gland, trachea, and esophagus demonstrate no significant findings. Lungs/Pleura: Lungs are clear. No pleural effusion or pneumothorax. Musculoskeletal: No chest wall abnormality. No acute or significant osseous findings. Review of the MIP images confirms the above findings. CTA ABDOMEN AND PELVIS FINDINGS VASCULAR Aorta: Normal caliber aorta without aneurysm, dissection, vasculitis or significant stenosis. Celiac: Patent without evidence of aneurysm, dissection, vasculitis or significant stenosis. SMA: Patent without evidence of aneurysm, dissection, vasculitis or significant stenosis. Renals: Both renal arteries are patent without evidence of aneurysm, dissection, vasculitis, fibromuscular dysplasia or significant stenosis. IMA: Patent without evidence of aneurysm, dissection, vasculitis or significant stenosis. Inflow: Patent without evidence of aneurysm, dissection, vasculitis or significant stenosis. Veins: No obvious venous abnormality within the limitations of this arterial phase study. Review of the MIP images confirms the above findings. NON-VASCULAR Hepatobiliary: No focal  liver abnormality is seen. No gallstones, gallbladder wall thickening, or biliary dilatation. Pancreas: Mild edema and subtle stranding about the pancreatic head. No fluid collection. No ductal dilation. Spleen: Unremarkable. Adrenals/Urinary Tract: Normal adrenal glands. No urinary calculi or hydronephrosis. Unremarkable bladder. Stomach/Bowel: Normal caliber large and small bowel. Normal appendix. No bowel wall thickening. Stomach is within normal limits. Lymphatic: No  lymphadenopathy. Reproductive: Unremarkable. Other: No free intraperitoneal air or abdominal hernia. Musculoskeletal: No acute or significant osseous findings. Review of the MIP images confirms the above findings. IMPRESSION: 1. No acute aortic syndrome. 2. Mild edema and subtle stranding about the pancreatic head. 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 Electronically Signed   By: Minerva Fester M.D.   On: 10/20/2022 02:52   CT Head Wo Contrast  Result Date: 10/20/2022 CLINICAL DATA:  Headache, neuro deficit EXAM: CT HEAD WITHOUT CONTRAST TECHNIQUE: Contiguous axial images were obtained from the base of the skull through the vertex without intravenous contrast. RADIATION DOSE REDUCTION: This exam was performed according to the departmental dose-optimization program which includes automated exposure control, adjustment of the mA and/or kV according to patient size and/or use of iterative reconstruction technique. COMPARISON:  None Available. FINDINGS: Brain: No evidence of acute infarction, hemorrhage, hydrocephalus, extra-axial collection or mass lesion/mass effect. Vascular: No hyperdense vessel or unexpected calcification. Skull: Normal. Negative for fracture or focal lesion. Sinuses/Orbits: No acute finding. Other: Mastoid air cells and middle ear cavities are clear IMPRESSION: 1. No acute intracranial abnormality. Electronically Signed   By: Helyn Numbers M.D.   On: 10/20/2022 02:30   DG Chest 2 View  Result Date: 10/20/2022 CLINICAL DATA:  Chest pain. EXAM: CHEST - 2 VIEW COMPARISON:  Sep 21, 2022 FINDINGS: The heart size and mediastinal contours are within normal limits. Both lungs are clear. The visualized skeletal structures are unremarkable. IMPRESSION: No active cardiopulmonary disease. Electronically Signed   By: Aram Candela M.D.   On: 10/20/2022 00:48       LOS: 0 days   Mohini Heathcock Rito Ehrlich  [facility] Pager on [email or web address]  10/20/2022, 9:19  AM

## 2022-10-20 NOTE — ED Provider Triage Note (Signed)
Emergency Medicine Provider Triage Evaluation Note  Randy [name]. , a 42 y.o. male  was evaluated in triage.  Pt complains of presenting with back pain nausea vomiting, states it started earlier today, and that she was having back pain then developed nausea vomiting, states that pain came on gradually got worse since nausea vomiting, denies any bloody emesis or coffee-ground emesis still passing gas however no bowel movements denies any chest pain or shortness of breath does admit that he has a slight headache but without any change in vision paresthesia weakness upper lower extremities.  No history of dissections or aneurysms, states this feels typical when he has pancreatitis..  Review of Systems  Positive: Back pain, nausea Negative: Chest pain shortness of breath  Physical Exam  BP (!) 251/122 (BP Location: Left Arm)   Pulse 80   Temp 98.4 F (36.9 C)   Resp 18   Ht 5\' 8"  (1.727 m)   Wt 87.1 kg   SpO2 100%   BMI 29.19 kg/m  Gen:   Awake, no distress   Resp:  Normal effort  MSK:   Moves extremities without difficulty  Other:  No facial asymmetry no difficulty with word finding following two-step commands there is no unilateral weakness present.  Medical Decision Making  Medically screening exam initiated at 12:54 AM.  Appropriate orders placed.  Randy [name]. was informed that the remainder of the evaluation will be completed by another provider, this initial triage assessment does not replace that evaluation, and the importance of remaining in the ED until their evaluation is complete.  Lab workup and imaging have been ordered patient will need further workup recommend immediate rooming for further evaluation.   Carroll Sage, PA-C 10/20/22 [phone]

## 2022-10-20 NOTE — Assessment & Plan Note (Addendum)
Nausea, vomiting, and abd pain. Pt in about once a month with same, due to some combination of: 1) acute on chronic pancreatitis 2) diabetic gastroparesis and 3) cannabinoid hyperemesis syndrome.  Scheduled reglan Got dilaudid in ED, will continue low dose PRN for pain for the moment since he does seem to have mild acute pancreatitis on CT today and lipase elevation Check UDS to see if still has THC on board, as noted previously: I wish hed quit using this just for a couple of months so we can see how much this is contributing... NPO IVF

## 2022-10-21 DIAGNOSIS — Z87891 Personal history of nicotine dependence: Secondary | ICD-10-CM | POA: Diagnosis not present

## 2022-10-21 DIAGNOSIS — K861 Other chronic pancreatitis: Secondary | ICD-10-CM | POA: Diagnosis present

## 2022-10-21 DIAGNOSIS — Z8249 Family history of ischemic heart disease and other diseases of the circulatory system: Secondary | ICD-10-CM | POA: Diagnosis not present

## 2022-10-21 DIAGNOSIS — Z91018 Allergy to other foods: Secondary | ICD-10-CM | POA: Diagnosis not present

## 2022-10-21 DIAGNOSIS — R7989 Other specified abnormal findings of blood chemistry: Secondary | ICD-10-CM | POA: Diagnosis present

## 2022-10-21 DIAGNOSIS — I129 Hypertensive chronic kidney disease with stage 1 through stage 4 chronic kidney disease, or unspecified chronic kidney disease: Secondary | ICD-10-CM | POA: Diagnosis present

## 2022-10-21 DIAGNOSIS — E876 Hypokalemia: Secondary | ICD-10-CM | POA: Diagnosis present

## 2022-10-21 DIAGNOSIS — E860 Dehydration: Secondary | ICD-10-CM | POA: Diagnosis present

## 2022-10-21 DIAGNOSIS — I160 Hypertensive urgency: Secondary | ICD-10-CM | POA: Diagnosis present

## 2022-10-21 DIAGNOSIS — E785 Hyperlipidemia, unspecified: Secondary | ICD-10-CM | POA: Diagnosis present

## 2022-10-21 DIAGNOSIS — Z833 Family history of diabetes mellitus: Secondary | ICD-10-CM | POA: Diagnosis not present

## 2022-10-21 DIAGNOSIS — E1143 Type 2 diabetes mellitus with diabetic autonomic (poly)neuropathy: Secondary | ICD-10-CM | POA: Diagnosis present

## 2022-10-21 DIAGNOSIS — Z79899 Other long term (current) drug therapy: Secondary | ICD-10-CM | POA: Diagnosis not present

## 2022-10-21 DIAGNOSIS — R112 Nausea with vomiting, unspecified: Secondary | ICD-10-CM | POA: Diagnosis not present

## 2022-10-21 DIAGNOSIS — F1210 Cannabis abuse, uncomplicated: Secondary | ICD-10-CM | POA: Diagnosis present

## 2022-10-21 DIAGNOSIS — Z794 Long term (current) use of insulin: Secondary | ICD-10-CM | POA: Diagnosis not present

## 2022-10-21 DIAGNOSIS — N1831 Chronic kidney disease, stage 3a: Secondary | ICD-10-CM | POA: Diagnosis present

## 2022-10-21 DIAGNOSIS — E1122 Type 2 diabetes mellitus with diabetic chronic kidney disease: Secondary | ICD-10-CM | POA: Diagnosis present

## 2022-10-21 DIAGNOSIS — K3184 Gastroparesis: Secondary | ICD-10-CM | POA: Diagnosis present

## 2022-10-21 DIAGNOSIS — E1165 Type 2 diabetes mellitus with hyperglycemia: Secondary | ICD-10-CM | POA: Diagnosis present

## 2022-10-21 DIAGNOSIS — K859 Acute pancreatitis without necrosis or infection, unspecified: Secondary | ICD-10-CM | POA: Diagnosis present

## 2022-10-21 DIAGNOSIS — H548 Legal blindness, as defined in USA: Secondary | ICD-10-CM | POA: Diagnosis present

## 2022-10-21 DIAGNOSIS — D631 Anemia in chronic kidney disease: Secondary | ICD-10-CM | POA: Diagnosis present

## 2022-10-21 LAB — COMPREHENSIVE METABOLIC PANEL
ALT: 16 U/L (ref 0–44)
AST: 14 U/L — ABNORMAL LOW (ref 15–41)
Albumin: 3.2 g/dL — ABNORMAL LOW (ref 3.5–5.0)
Alkaline Phosphatase: 85 U/L (ref 38–126)
Anion gap: 12 (ref 5–15)
BUN: 19 mg/dL (ref 6–20)
CO2: 22 mmol/L (ref 22–32)
Calcium: 8.7 mg/dL — ABNORMAL LOW (ref 8.9–10.3)
Chloride: 100 mmol/L (ref 98–111)
Creatinine, Ser: 1.8 mg/dL — ABNORMAL HIGH (ref 0.61–1.24)
GFR, Estimated: 47 mL/min — ABNORMAL LOW (ref >60)
Glucose, Bld: 185 mg/dL — ABNORMAL HIGH (ref 70–99)
Potassium: 3.6 mmol/L (ref 3.5–5.1)
Sodium: 134 mmol/L — ABNORMAL LOW (ref 135–145)
Total Bilirubin: 0.6 mg/dL (ref 0.3–1.2)
Total Protein: 6.8 g/dL (ref 6.5–8.1)

## 2022-10-21 LAB — LIPASE, BLOOD: Lipase: 40 U/L (ref 11–51)

## 2022-10-21 LAB — GLUCOSE, CAPILLARY
Glucose-Capillary: 177 mg/dL — ABNORMAL HIGH (ref 70–99)
Glucose-Capillary: 185 mg/dL — ABNORMAL HIGH (ref 70–99)
Glucose-Capillary: 99 mg/dL (ref 70–99)
Glucose-Capillary: 55 mg/dL — ABNORMAL LOW (ref 70–99)
Glucose-Capillary: 172 mg/dL — ABNORMAL HIGH (ref 70–99)
Glucose-Capillary: 170 mg/dL — ABNORMAL HIGH (ref 70–99)
Glucose-Capillary: 75 mg/dL (ref 70–99)

## 2022-10-21 NOTE — Progress Notes (Signed)
[facility] PROGRESS NOTE   Dmauri R Tommi Emery. ZOX:[id number] DOB: 1980-07-20 DOA: 10/19/2022  PCP: Georganna Skeans, MD  Brief History/Interval Summary: 42 y.o. male with medical history significant of HTN, HLD, DM2. Pt well known to our service, gets admitted about once a month with abd pain, nausea, vomiting.  Has h/o recurrent pancreatitis.  Also suspected to have some combination of diabetic gastroparesis and cannabinoid hyperemesis syndrome.  Presented with abdominal pain.  Found to have elevated lipase level and CT findings suggestive of pancreatitis.  He was hospitalized for further management.  Consultants: Gastroenterology  Procedures: None yet    Subjective/Interval History: Patient mentions that his abdominal pain is better this morning compared to yesterday.  Has not had any vomiting overnight.  No other complaints offered.     Assessment/Plan:  Acute pancreatitis Lipase level was elevated at 143.  CT raised concern for pancreatitis as well.  Etiology for his pancreatitis is not clear.   Lipase level has improved to 40 as of this morning.  LFTs remain unremarkable.   No abnormality noted in his hepatobiliary system on CT scan.  He denies alcohol use. Gastroenterology has been consulted to assist with management. Seems to be doing better today.  Will advance to clear liquid diet today.  Hypertensive urgency Blood pressure noted to be quite elevated.  Likely a combination of his known hypertension as well as elevated blood pressure from acute pain issues.   Blood pressure was very hard to control yesterday.  Seems to be better this morning.  Currently on amlodipine and carvedilol hydralazine.  Clonidine also had to be added yesterday evening.  Will try to wean him off of clonidine over the next 24 to 48 hours.    Intractable nausea and vomiting/diabetic gastroparesis/cannabinoid hyperemesis syndrome To be improving.  Continue with metoclopramide.  Continue with  Protonix.  No plans for any procedure as per gastroenterology.  Patient needs to abstain from marijuana use.    Diabetes mellitus type 2 with hyperglycemia Continue glargine and SSI.  Monitor CBGs.  Adjust insulin dose as indicated.  HbA1c was 9.8 in February.  Chronic kidney disease stage IIIa Baseline creatinine seems to be around 1.5.  Creatinine noted to be 1.8 today.  He did get contrast with the CT scan on 6/9.  Avoid nephrotoxic agents.  Continue with IV fluids for now.  Monitor urine output.  Recheck labs tomorrow.    Normocytic anemia Hemoglobin close to baseline.  No evidence for overt bleeding.   DVT Prophylaxis: Lovenox Code Status: Full code Family Communication: Discussed with patient Disposition Plan: Hopefully return home when improved.  Start mobilizing.  Status is: Observation The patient will require care spanning > 2 midnights and should be moved to inpatient because: Intractable nausea vomiting      Medications: Scheduled:  amLODipine  10 mg Oral Daily   atorvastatin  80 mg Oral Daily   carvedilol  25 mg Oral BID WC   cloNIDine  0.1 mg Oral BID   enoxaparin (LOVENOX) injection  40 mg Subcutaneous Q24H   hydrALAZINE  100 mg Oral Q8H   insulin aspart  0-9 Units Subcutaneous Q4H   insulin glargine-yfgn  34 Units Subcutaneous Daily   metoCLOPramide (REGLAN) injection  10 mg Intravenous Q6H   pantoprazole  40 mg Oral BID   Continuous:  lactated ringers Stopped (10/20/22 1745)   promethazine (PHENERGAN) injection (IM or IVPB) Stopped (10/20/22 1801)   WJX:BJYNWGNFAOZHY **OR** acetaminophen, hydrALAZINE, HYDROmorphone (DILAUDID) injection, ipratropium-albuterol, promethazine (PHENERGAN)  injection (IM or IVPB), traMADol  Antibiotics: Anti-infectives (From admission, onward)    None       Objective:  Vital Signs  Vitals:   10/21/22 0343 10/21/22 0456 10/21/22 0515 10/21/22 0805  BP: (!) 141/96 (!) 117/96 (!) 156/91 (!) 146/87  Pulse: (!) 104  (!)  102 (!) 108  Resp: 18 16 18 17   Temp: 98 F (36.7 C) 98.3 F (36.8 C) 98 F (36.7 C) 98.7 F (37.1 C)  TempSrc:  Oral    SpO2: 96% 96% 99% 98%  Weight:      Height:        Intake/Output Summary (Last 24 hours) at 10/21/2022 0938 Last data filed at 10/21/2022 0700 Gross per 24 hour  Intake 1508.12 ml  Output 1500 ml  Net 8.12 ml   Filed Weights   10/19/22 2336  Weight: 87.1 kg    General appearance: Awake alert.  In no distress Resp: Clear to auscultation bilaterally.  Normal effort Cardio: S1-S2 is normal regular.  No S3-S4.  No rubs murmurs or bruit GI: Abdomen is soft.  Less tender today in the epigastric area compared to yesterday.  No rebound rigidity or guarding.  No masses organomegaly. Extremities: No edema.  Full range of motion of lower extremities. Neurologic: Alert and oriented x3.  No focal neurological deficits.     Lab Results:  Data Reviewed: I have personally reviewed following labs and reports of the imaging studies  CBC: Recent Labs  Lab 10/19/22 2357 10/20/22 0512  WBC 6.4 6.6  HGB 11.4* 10.2*  HCT 35.5* 31.8*  MCV 85.1 84.4  PLT 251 304     Basic Metabolic Panel: Recent Labs  Lab 10/19/22 2357 10/20/22 0512 10/21/22 0822  NA 134* 135 134*  K 5.0 3.7 3.6  CL 100 103 100  CO2 23 25 22   GLUCOSE 290* 271* 185*  BUN 18 15 19   CREATININE 1.59* 1.46* 1.80*  CALCIUM 8.8* 8.4* 8.7*     GFR: Estimated Creatinine Clearance: 56.8 mL/min (A) (by C-G formula based on SCr of 1.8 mg/dL (H)).  Liver Function Tests: Recent Labs  Lab 10/19/22 2357 10/20/22 0512 10/21/22 0822  AST 47* 18 14*  ALT 21 18 16   ALKPHOS 104 103 85  BILITOT 1.7* 0.6 0.6  PROT 6.8 6.1* 6.8  ALBUMIN 3.4* 3.0* 3.2*     Recent Labs  Lab 10/19/22 2357 10/21/22 0822  LIPASE 143* 40    CBG: Recent Labs  Lab 10/20/22 1738 10/20/22 2026 10/20/22 2319 10/21/22 0345 10/21/22 0804  GLUCAP 117* 120* 166* 177* 185*       Radiology Studies: CT Angio  Chest/Abd/Pel for Dissection W and/or Wo Contrast  Result Date: 10/20/2022 CLINICAL DATA:  Patient believes he is having a pancreas flare-up. Acute aortic syndrome suspected EXAM: CT ANGIOGRAPHY CHEST, ABDOMEN AND PELVIS TECHNIQUE: Non-contrast CT of the chest was initially obtained. Multidetector CT imaging through the chest, abdomen and pelvis was performed using the standard protocol during bolus administration of intravenous contrast. Multiplanar reconstructed images and MIPs were obtained and reviewed to evaluate the vascular anatomy. RADIATION DOSE REDUCTION: This exam was performed according to the departmental dose-optimization program which includes automated exposure control, adjustment of the mA and/or kV according to patient size and/or use of iterative reconstruction technique. CONTRAST:  75mL OMNIPAQUE IOHEXOL 350 MG/ML SOLN COMPARISON:  Chest radiograph 10/20/2022; CTA chest abdomen and pelvis 09/12/2020 and CT abdomen and pelvis 05/20/2022 FINDINGS: CTA CHEST FINDINGS Cardiovascular: No acute aortic syndrome. Borderline  cardiomegaly. No pericardial effusion. Mediastinum/Nodes: No enlarged mediastinal, hilar, or axillary lymph nodes. Thyroid gland, trachea, and esophagus demonstrate no significant findings. Lungs/Pleura: Lungs are clear. No pleural effusion or pneumothorax. Musculoskeletal: No chest wall abnormality. No acute or significant osseous findings. Review of the MIP images confirms the above findings. CTA ABDOMEN AND PELVIS FINDINGS VASCULAR Aorta: Normal caliber aorta without aneurysm, dissection, vasculitis or significant stenosis. Celiac: Patent without evidence of aneurysm, dissection, vasculitis or significant stenosis. SMA: Patent without evidence of aneurysm, dissection, vasculitis or significant stenosis. Renals: Both renal arteries are patent without evidence of aneurysm, dissection, vasculitis, fibromuscular dysplasia or significant stenosis. IMA: Patent without evidence of  aneurysm, dissection, vasculitis or significant stenosis. Inflow: Patent without evidence of aneurysm, dissection, vasculitis or significant stenosis. Veins: No obvious venous abnormality within the limitations of this arterial phase study. Review of the MIP images confirms the above findings. NON-VASCULAR Hepatobiliary: No focal liver abnormality is seen. No gallstones, gallbladder wall thickening, or biliary dilatation. Pancreas: Mild edema and subtle stranding about the pancreatic head. No fluid collection. No ductal dilation. Spleen: Unremarkable. Adrenals/Urinary Tract: Normal adrenal glands. No urinary calculi or hydronephrosis. Unremarkable bladder. Stomach/Bowel: Normal caliber large and small bowel. Normal appendix. No bowel wall thickening. Stomach is within normal limits. Lymphatic: No lymphadenopathy. Reproductive: Unremarkable. Other: No free intraperitoneal air or abdominal hernia. Musculoskeletal: No acute or significant osseous findings. Review of the MIP images confirms the above findings. IMPRESSION: 1. No acute aortic syndrome. 2. Mild edema and subtle stranding about the pancreatic head. 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 Electronically Signed   By: Minerva Fester M.D.   On: 10/20/2022 02:52   CT Head Wo Contrast  Result Date: 10/20/2022 CLINICAL DATA:  Headache, neuro deficit EXAM: CT HEAD WITHOUT CONTRAST TECHNIQUE: Contiguous axial images were obtained from the base of the skull through the vertex without intravenous contrast. RADIATION DOSE REDUCTION: This exam was performed according to the departmental dose-optimization program which includes automated exposure control, adjustment of the mA and/or kV according to patient size and/or use of iterative reconstruction technique. COMPARISON:  None Available. FINDINGS: Brain: No evidence of acute infarction, hemorrhage, hydrocephalus, extra-axial collection or mass lesion/mass effect. Vascular: No  hyperdense vessel or unexpected calcification. Skull: Normal. Negative for fracture or focal lesion. Sinuses/Orbits: No acute finding. Other: Mastoid air cells and middle ear cavities are clear IMPRESSION: 1. No acute intracranial abnormality. Electronically Signed   By: Helyn Numbers M.D.   On: 10/20/2022 02:30   DG Chest 2 View  Result Date: 10/20/2022 CLINICAL DATA:  Chest pain. EXAM: CHEST - 2 VIEW COMPARISON:  Sep 21, 2022 FINDINGS: The heart size and mediastinal contours are within normal limits. Both lungs are clear. The visualized skeletal structures are unremarkable. IMPRESSION: No active cardiopulmonary disease. Electronically Signed   By: Aram Candela M.D.   On: 10/20/2022 00:48       LOS: 0 days   Coe Angelos Rito Ehrlich  [facility] Pager on [email or web address]  10/21/2022, 9:38 AM

## 2022-10-21 NOTE — Progress Notes (Addendum)
Attending physician's note   I have taken a history, reviewed the chart, and examined the patient. I performed a substantive portion of this encounter, including complete performance of at least one of the key components, in conjunction with the APP. I agree with the APP's note, impression, and recommendations with my edits.   Abdominal pain much improved, nausea nearly resolved and emesis has not recurred.  Tolerating liquids.  Lipase now normal at 40.  IgG4 negative/normal. - Continue slowly advancing diet as tolerated - Continue antiemetics - Supportive care per primary Hospital service - Inpatient GI service will sign off at this time.  Please do not hesitate to contact us with additional questions or concerns  Xcaret Morad, DO, FACG [phone] office          Progress Note  Primary GI: Dr. Lavon Paganini  LOS: 0 days   Chief Complaint:intractable nausea and vomiting   Subjective   Patient reports no further episodes of vomiting. States his lower abdominal pain has improved.    Objective   Vital signs in last 24 hours: Temp:  [98 F (36.7 C)-98.7 F (37.1 C)] 98.7 F (37.1 C) (06/10 0805) Pulse Rate:  [80-108] 108 (06/10 0805) Resp:  [16-18] 17 (06/10 0805) BP: (117-238)/(12-121) 146/87 (06/10 0805) SpO2:  [96 %-99 %] 98 % (06/10 0805) Last BM Date : 10/19/22 Last BM recorded by nurses in past 5 days No data recorded  General:   male in no acute distress  Heart:  tachycardic, regular rhythm; no murmurs Pulm: Clear anteriorly; no wheezing Abdomen: soft, nondistended, normal bowel sounds in all quadrants. Nontender without guarding. No organomegaly appreciated. Extremities:  No edema Neurologic:  Alert and  oriented x4;  No focal deficits.  Psych:  Cooperative. Normal mood and affect.  Intake/Output from previous day: 06/09 0701 - 06/10 0700 In: 1508.1 [P.O.:240; I.V.:1168.1; IV Piggyback:100] Out: 1500 [Urine:1500] Intake/Output this shift: No  intake/output data recorded.  Studies/Results: CT Angio Chest/Abd/Pel for Dissection W and/or Wo Contrast  Result Date: 10/20/2022 CLINICAL DATA:  Patient believes he is having a pancreas flare-up. Acute aortic syndrome suspected EXAM: CT ANGIOGRAPHY CHEST, ABDOMEN AND PELVIS TECHNIQUE: Non-contrast CT of the chest was initially obtained. Multidetector CT imaging through the chest, abdomen and pelvis was performed using the standard protocol during bolus administration of intravenous contrast. Multiplanar reconstructed images and MIPs were obtained and reviewed to evaluate the vascular anatomy. RADIATION DOSE REDUCTION: This exam was performed according to the departmental dose-optimization program which includes automated exposure control, adjustment of the mA and/or kV according to patient size and/or use of iterative reconstruction technique. CONTRAST:  75mL OMNIPAQUE IOHEXOL 350 MG/ML SOLN COMPARISON:  Chest radiograph 10/20/2022; CTA chest abdomen and pelvis 09/12/2020 and CT abdomen and pelvis 05/20/2022 FINDINGS: CTA CHEST FINDINGS Cardiovascular: No acute aortic syndrome. Borderline cardiomegaly. No pericardial effusion. Mediastinum/Nodes: No enlarged mediastinal, hilar, or axillary lymph nodes. Thyroid gland, trachea, and esophagus demonstrate no significant findings. Lungs/Pleura: Lungs are clear. No pleural effusion or pneumothorax. Musculoskeletal: No chest wall abnormality. No acute or significant osseous findings. Review of the MIP images confirms the above findings. CTA ABDOMEN AND PELVIS FINDINGS VASCULAR Aorta: Normal caliber aorta without aneurysm, dissection, vasculitis or significant stenosis. Celiac: Patent without evidence of aneurysm, dissection, vasculitis or significant stenosis. SMA: Patent without evidence of aneurysm, dissection, vasculitis or significant stenosis. Renals: Both renal arteries are patent without evidence of aneurysm, dissection, vasculitis, fibromuscular dysplasia or  significant stenosis. IMA: Patent without evidence of aneurysm, dissection, vasculitis  or significant stenosis. Inflow: Patent without evidence of aneurysm, dissection, vasculitis or significant stenosis. Veins: No obvious venous abnormality within the limitations of this arterial phase study. Review of the MIP images confirms the above findings. NON-VASCULAR Hepatobiliary: No focal liver abnormality is seen. No gallstones, gallbladder wall thickening, or biliary dilatation. Pancreas: Mild edema and subtle stranding about the pancreatic head. No fluid collection. No ductal dilation. Spleen: Unremarkable. Adrenals/Urinary Tract: Normal adrenal glands. No urinary calculi or hydronephrosis. Unremarkable bladder. Stomach/Bowel: Normal caliber large and small bowel. Normal appendix. No bowel wall thickening. Stomach is within normal limits. Lymphatic: No lymphadenopathy. Reproductive: Unremarkable. Other: No free intraperitoneal air or abdominal hernia. Musculoskeletal: No acute or significant osseous findings. Review of the MIP images confirms the above findings. IMPRESSION: 1. No acute aortic syndrome. 2. Mild edema and subtle stranding about the pancreatic head. 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 Electronically Signed   By: Minerva Fester M.D.   On: 10/20/2022 02:52   CT Head Wo Contrast  Result Date: 10/20/2022 CLINICAL DATA:  Headache, neuro deficit EXAM: CT HEAD WITHOUT CONTRAST TECHNIQUE: Contiguous axial images were obtained from the base of the skull through the vertex without intravenous contrast. RADIATION DOSE REDUCTION: This exam was performed according to the departmental dose-optimization program which includes automated exposure control, adjustment of the mA and/or kV according to patient size and/or use of iterative reconstruction technique. COMPARISON:  None Available. FINDINGS: Brain: No evidence of acute infarction, hemorrhage, hydrocephalus, extra-axial  collection or mass lesion/mass effect. Vascular: No hyperdense vessel or unexpected calcification. Skull: Normal. Negative for fracture or focal lesion. Sinuses/Orbits: No acute finding. Other: Mastoid air cells and middle ear cavities are clear IMPRESSION: 1. No acute intracranial abnormality. Electronically Signed   By: Helyn Numbers M.D.   On: 10/20/2022 02:30   DG Chest 2 View  Result Date: 10/20/2022 CLINICAL DATA:  Chest pain. EXAM: CHEST - 2 VIEW COMPARISON:  Sep 21, 2022 FINDINGS: The heart size and mediastinal contours are within normal limits. Both lungs are clear. The visualized skeletal structures are unremarkable. IMPRESSION: No active cardiopulmonary disease. Electronically Signed   By: Aram Candela M.D.   On: 10/20/2022 00:48    Lab Results: Recent Labs    10/19/22 2357 10/20/22 0512  WBC 6.4 6.6  HGB 11.4* 10.2*  HCT 35.5* 31.8*  PLT 251 304   BMET Recent Labs    10/19/22 2357 10/20/22 0512 10/21/22 0822  NA 134* 135 134*  K 5.0 3.7 3.6  CL 100 103 100  CO2 23 25 22   GLUCOSE 290* 271* 185*  BUN 18 15 19   CREATININE 1.59* 1.46* 1.80*  CALCIUM 8.8* 8.4* 8.7*   LFT Recent Labs    10/21/22 0822  PROT 6.8  ALBUMIN 3.2*  AST 14*  ALT 16  ALKPHOS 85  BILITOT 0.6   PT/INR No results for input(s): "LABPROT", "INR" in the last 72 hours.   Scheduled Meds:  amLODipine  10 mg Oral Daily   atorvastatin  80 mg Oral Daily   carvedilol  25 mg Oral BID WC   cloNIDine  0.1 mg Oral BID   enoxaparin (LOVENOX) injection  40 mg Subcutaneous Q24H   hydrALAZINE  100 mg Oral Q8H   insulin aspart  0-9 Units Subcutaneous Q4H   insulin glargine-yfgn  34 Units Subcutaneous Daily   metoCLOPramide (REGLAN) injection  10 mg Intravenous Q6H   pantoprazole  40 mg Oral BID   Continuous Infusions:  lactated ringers  Stopped (10/20/22 1745)   promethazine (PHENERGAN) injection (IM or IVPB) Stopped (10/20/22 1801)      Impression:   Intractable nausea and  vomiting Lower back pain/Lower abdominal pain Type 2 Diabetes Cannabis abuse Acute on chronic pancreatitis (?) - no leukocytosis - Lipase 143 - CTA showing mild edema and stranding around pancreatic head - BUN 15, Cr. 1.46 - Glucose 236 - Negative IgG 08/2022 - Triglycerides 245 06/2021 Suspected cannabis hyperemesis syndrome. Mild elevation in lipase and mild edema seen on CT likely in the setting of dehydration secondary to intractable nausea and vomiting   Plan:   - trial of clear liquids. Can continue to advance diet as tolerated. - continue supportive care - continue antiemetics prn   Bayley Leanna Sato  10/21/2022, 10:31 AM

## 2022-10-22 DIAGNOSIS — K859 Acute pancreatitis without necrosis or infection, unspecified: Secondary | ICD-10-CM | POA: Diagnosis not present

## 2022-10-22 DIAGNOSIS — R112 Nausea with vomiting, unspecified: Secondary | ICD-10-CM | POA: Diagnosis not present

## 2022-10-22 DIAGNOSIS — I160 Hypertensive urgency: Secondary | ICD-10-CM | POA: Diagnosis not present

## 2022-10-22 LAB — GLUCOSE, CAPILLARY
Glucose-Capillary: 101 mg/dL — ABNORMAL HIGH (ref 70–99)
Glucose-Capillary: 101 mg/dL — ABNORMAL HIGH (ref 70–99)
Glucose-Capillary: 125 mg/dL — ABNORMAL HIGH (ref 70–99)
Glucose-Capillary: 140 mg/dL — ABNORMAL HIGH (ref 70–99)
Glucose-Capillary: 40 mg/dL — CL (ref 70–99)
Glucose-Capillary: 88 mg/dL (ref 70–99)

## 2022-10-22 LAB — BASIC METABOLIC PANEL
Anion gap: 12 (ref 5–15)
BUN: 15 mg/dL (ref 6–20)
CO2: 23 mmol/L (ref 22–32)
Calcium: 8.8 mg/dL — ABNORMAL LOW (ref 8.9–10.3)
Chloride: 103 mmol/L (ref 98–111)
Creatinine, Ser: 1.61 mg/dL — ABNORMAL HIGH (ref 0.61–1.24)
GFR, Estimated: 54 mL/min — ABNORMAL LOW (ref >60)
Glucose, Bld: 97 mg/dL (ref 70–99)
Potassium: 3.4 mmol/L — ABNORMAL LOW (ref 3.5–5.1)
Sodium: 138 mmol/L (ref 135–145)

## 2022-10-22 LAB — CBC
HCT: 34.8 % — ABNORMAL LOW (ref 39.0–52.0)
Hemoglobin: 11.2 g/dL — ABNORMAL LOW (ref 13.0–17.0)
MCH: 26.3 pg (ref 26.0–34.0)
MCHC: 32.2 g/dL (ref 30.0–36.0)
MCV: 81.7 fL (ref 80.0–100.0)
Platelets: 323 10*3/uL (ref 150–400)
RBC: 4.26 MIL/uL (ref 4.22–5.81)
RDW: 13.5 % (ref 11.5–15.5)
WBC: 8 10*3/uL (ref 4.0–10.5)
nRBC: 0 % (ref 0.0–0.2)

## 2022-10-22 MED ORDER — POTASSIUM CHLORIDE IN NACL 20-0.45 MEQ/L-% IV SOLN
INTRAVENOUS | Status: AC
  Filled 2022-10-22: qty 1000

## 2022-10-22 MED ORDER — POTASSIUM CHLORIDE 10 MEQ/100ML IV SOLN
10.0000 meq | INTRAVENOUS | Status: AC
  Administered 2022-10-22 (×2): 10 meq via INTRAVENOUS
  Filled 2022-10-22 (×2): qty 100

## 2022-10-22 MED ORDER — INSULIN GLARGINE-YFGN 100 UNIT/ML ~~LOC~~ SOLN
28.0000 [IU] | Freq: Every day | SUBCUTANEOUS | Status: DC
  Administered 2022-10-22 – 2022-10-23 (×2): 28 [IU] via SUBCUTANEOUS
  Filled 2022-10-22 (×2): qty 0.28

## 2022-10-22 NOTE — TOC Initial Note (Signed)
Transition of Care (TOC) - Initial/Assessment Note    Patient Details  Name: Randy [name]. MRN: [id number] Date of Birth: 11-Apr-1981  Transition of Care [facility]) CM/SW Contact:    Janae Bridgeman, RN Phone Number: 10/22/2022, 11:24 AM  Clinical Narrative:                 CM met with the patient at the bedside to discuss TOC needs.  The patient lives at home with his mother and son and is blind.  The patient was admitted for intractable nausea and vomiting, hypertension.  The patient uses Marijuana and was not receptive to OP counseling for substance abuse.  Patient declines smoking and ETOH.  The patient uses a cane at the home.    No other TOC needs at this time.  Patient will be discharged home once medically stable for discharge.  Expected Discharge Plan: Home/Self Care Barriers to Discharge: Continued Medical Work up   Patient Goals and CMS Choice Patient states their goals for this hospitalization and ongoing recovery are:: To return home CMS Medicare.gov Compare Post Acute Care list provided to:: Patient Choice offered to / list presented to : Patient [place] ownership interest in [facility].provided to:: Patient    Expected Discharge Plan and Services   Discharge Planning Services: CM Consult   Living arrangements for the past 2 months: Apartment                                      Prior Living Arrangements/Services Living arrangements for the past 2 months: Apartment Lives with:: Relatives Patient language and need for interpreter reviewed:: Yes Do you feel safe going back to the place where you live?: Yes      Need for Family Participation in Patient Care: Yes (Comment) Care giver support system in place?: Yes (comment) Current home services: DME (Cane at home) Criminal Activity/Legal Involvement Pertinent to Current Situation/Hospitalization: No - Comment as needed  Activities of Daily Living Home Assistive  Devices/Equipment: Cane (specify quad or straight) ADL Screening (condition at time of admission) Patient's cognitive ability adequate to safely complete daily activities?: Yes Is the patient deaf or have difficulty hearing?: No Does the patient have difficulty seeing, even when wearing glasses/contacts?: Yes Does the patient have difficulty concentrating, remembering, or making decisions?: No Patient able to express need for assistance with ADLs?: Yes Does the patient have difficulty dressing or bathing?: No Independently performs ADLs?: Yes (appropriate for developmental age) Does the patient have difficulty walking or climbing stairs?: No Weakness of Legs: None Weakness of Arms/Hands: None  Permission Sought/Granted Permission sought to share information with : Case Manager Permission granted to share information with : Yes, Verbal Permission Granted              Emotional Assessment Appearance:: Appears stated age Attitude/Demeanor/Rapport: Gracious Affect (typically observed): Accepting Orientation: : Oriented to Self, Oriented to Place, Oriented to  Time, Oriented to Situation Alcohol / Substance Use: Illicit Drugs (Uses Marijuana - declined OP counseling) Psych Involvement: No (comment)  Admission diagnosis:  Elevated troponin [R79.89] Hypertensive urgency [I16.0] Acute on chronic pancreatitis (HCC) [K85.90, K86.1] Acute pancreatitis without infection or necrosis, unspecified pancreatitis type [K85.90] Acute pancreatitis [K85.90] Patient Active Problem List   Diagnosis Date Noted   Uncontrolled type 2 diabetes mellitus with hyperglycemia (HCC) 05/28/2022   Leukocytosis 05/28/2022   Shortness of breath 05/21/2022   COVID-19  virus infection 05/20/2022   Hypomagnesemia 05/20/2022   COVID-19 05/20/2022   Anemia due to chronic kidney disease 03/28/2022   Stage 3a chronic kidney disease (CKD) (HCC) 03/27/2022   Obesity (BMI 30-39.9) 01/06/2022   Hyponatremia 01/04/2022    Acute on chronic pancreatitis (HCC) 01/04/2022   Abdominal pain 09/12/2021   Fall at home, initial encounter 06/18/2021   Acute pancreatitis 09/17/2020   Abnormal urinalysis 09/17/2020   Intractable nausea and vomiting 09/17/2020   Hypertensive crisis 09/12/2020   AKI (acute kidney injury) (HCC) 09/12/2020   GERD (gastroesophageal reflux disease) 09/12/2020   DM2 (diabetes mellitus, type 2) (HCC)    HLD (hyperlipidemia) 06/01/2019   Microalbuminuria due to type 2 diabetes mellitus (HCC) 06/01/2019   Essential hypertension 05/31/2019   Hypertensive urgency 08/31/2015   Type 2 diabetes mellitus with hyperglycemia, with long-term current use of insulin (HCC) 08/31/2015   Cannabis abuse 08/31/2015   PCP:  Georganna Skeans, MD Pharmacy:   [facility] [address] ([place]), [place] - [address] [place] ([place]) [place] [address] Phone: [phone] Fax: [phone]  [facility] - [facility] [address] [place] [address] Phone: [phone] Fax: [phone]     Social Determinants of Health (SDOH) Social History: SDOH Screenings   Food Insecurity: No Food Insecurity (10/20/2022)  Housing: Low Risk  (10/20/2022)  Transportation Needs: No Transportation Needs (10/20/2022)  Utilities: Not At Risk (10/20/2022)  Alcohol Screen: Low Risk  (07/18/2022)  Depression (PHQ2-9): Low Risk  (07/18/2022)  Financial Resource Strain: Low Risk  (07/18/2022)  Physical Activity: Inactive (07/18/2022)  Social Connections: Moderately Integrated (07/18/2022)  Stress: No Stress Concern Present (07/18/2022)  Tobacco Use: Medium Risk (09/21/2022)   SDOH Interventions:     Readmission Risk Interventions    10/22/2022   11:12 AM 05/12/2022    9:27 AM 01/06/2022    1:32 PM  Readmission Risk Prevention Plan  Transportation Screening Complete Complete Complete  PCP or Specialist Appt within 3-5 Days   Not Complete  Not  Complete comments   pt will need to call for appointment  HRI or Home Care Consult   Complete  Social Work Consult for Recovery Care Planning/Counseling   Complete  Palliative Care Screening   Not Applicable  Medication Review Oceanographer) Complete Complete Complete  PCP or Specialist appointment within 3-5 days of discharge Complete Complete   HRI or Home Care Consult Complete Complete   SW Recovery Care/Counseling Consult Complete Complete   Palliative Care Screening Not Applicable Not Applicable   Skilled Nursing Facility Not Applicable Not Applicable

## 2022-10-22 NOTE — Progress Notes (Signed)
[facility] PROGRESS NOTE   Randy [name]. WUJ:[id number] DOB: [birth year] DOA: 10/19/2022  PCP: Georganna Skeans, MD  Brief History/Interval Summary: 42 y.o. male with medical history significant of HTN, HLD, DM2. Pt well known to our service, gets admitted about once a month with abd pain, nausea, vomiting.  Has h/o recurrent pancreatitis.  Also suspected to have some combination of diabetic gastroparesis and cannabinoid hyperemesis syndrome.  Presented with abdominal pain.  Found to have elevated lipase level and CT findings suggestive of pancreatitis.  He was hospitalized for further management.  Consultants: Gastroenterology  Procedures: None yet    Subjective/Interval History: Patient mentioned that he was feeling better yesterday but this morning is experiencing some nausea.  Vomited x 1.  Abdominal pain persists.  Tolerated his clear liquids yesterday.   Assessment/Plan:  Acute pancreatitis Lipase level was elevated at 143.  CT raised concern for pancreatitis as well.  Etiology for his pancreatitis is not clear.  His levels have improved.  LFTs have been unremarkable. No abnormality noted in his hepatobiliary system on CT scan.  He denies alcohol use. Gastroenterology has seen the patient and they have signed off. Continue clear liquid diet for now. Mobilize.  Hypertensive urgency Blood pressure noted to be quite elevated.  Likely a combination of his known hypertension as well as elevated blood pressure from acute pain issues.   Continue amlodipine and carvedilol and hydralazine.  Required clonidine briefly but now off of it. Continue as needed agents as well.  Intractable nausea and vomiting/diabetic gastroparesis/cannabinoid hyperemesis syndrome Continue with metoclopramide and Protonix.  No procedure planned per gastroenterology.  They have actually signed off. Patient needs to abstain from marijuana use. Continues to have intermittent nausea and vomiting.   Continue with supportive care.    Diabetes mellitus type 2 with hyperglycemia HbA1c was 9.8 in February.  Low glucose levels noted over the last 24 hours.  Will decrease the dose of glargine.  Continue with SSI.    Chronic kidney disease stage IIIa/hypokalemia Baseline creatinine seems to be around 1.5.  Creatinine stable for the most part.  Did have increased to 1.8 to yesterday.  Better today.  Monitor urine output.  Avoid nephrotoxic agents.   Replace potassium.  Normocytic anemia Hemoglobin close to baseline.  No evidence for overt bleeding.   DVT Prophylaxis: Lovenox Code Status: Full code Family Communication: Discussed with patient Disposition Plan: Hopefully return home when improved.  Start mobilizing.      Medications: Scheduled:  amLODipine  10 mg Oral Daily   atorvastatin  80 mg Oral Daily   carvedilol  25 mg Oral BID WC   enoxaparin (LOVENOX) injection  40 mg Subcutaneous Q24H   hydrALAZINE  100 mg Oral Q8H   insulin aspart  0-9 Units Subcutaneous Q4H   insulin glargine-yfgn  28 Units Subcutaneous Daily   metoCLOPramide (REGLAN) injection  10 mg Intravenous Q6H   pantoprazole  40 mg Oral BID   Continuous:  lactated ringers 100 mL/hr at 10/22/22 0013   promethazine (PHENERGAN) injection (IM or IVPB) Stopped (10/20/22 1801)   NFA:OZHYQMVHQIONG **OR** acetaminophen, hydrALAZINE, HYDROmorphone (DILAUDID) injection, ipratropium-albuterol, promethazine (PHENERGAN) injection (IM or IVPB), traMADol  Antibiotics: Anti-infectives (From admission, onward)    None       Objective:  Vital Signs  Vitals:   10/21/22 2035 10/21/22 2328 10/22/22 0513 10/22/22 0739  BP: (!) 161/83 (!) 164/81 (!) 190/107 (!) 168/100  Pulse: 71 65 83 85  Resp: 18  18 18  Temp: 98.6 F (37 C)  98.4 F (36.9 C) 98.4 F (36.9 C)  TempSrc:    Oral  SpO2: 97% 99% 98% 99%  Weight:      Height:        Intake/Output Summary (Last 24 hours) at 10/22/2022 0948 Last data filed at  10/22/2022 0740 Gross per 24 hour  Intake --  Output 1775 ml  Net -1775 ml    Filed Weights   10/19/22 2336  Weight: 87.1 kg    General appearance: Awake alert.  In no distress Resp: Clear to auscultation bilaterally.  Normal effort Cardio: S1-S2 is normal regular.  No S3-S4.  No rubs murmurs or bruit GI: Abdomen is soft.  Tender diffusely without any rebound rigidity or guarding.  No masses organomegaly. Extremities: No edema.  Full range of motion of lower extremities. Neurologic: Alert and oriented x3.  No focal neurological deficits.      Lab Results:  Data Reviewed: I have personally reviewed following labs and reports of the imaging studies  CBC: Recent Labs  Lab 10/19/22 2357 10/20/22 0512 10/22/22 0636  WBC 6.4 6.6 8.0  HGB 11.4* 10.2* 11.2*  HCT 35.5* 31.8* 34.8*  MCV 85.1 84.4 81.7  PLT 251 304 323     Basic Metabolic Panel: Recent Labs  Lab 10/19/22 2357 10/20/22 0512 10/21/22 0822 10/22/22 0636  NA 134* 135 134* 138  K 5.0 3.7 3.6 3.4*  CL 100 103 100 103  CO2 23 25 22 23   GLUCOSE 290* 271* 185* 97  BUN 18 15 19 15   CREATININE 1.59* 1.46* 1.80* 1.61*  CALCIUM 8.8* 8.4* 8.7* 8.8*     GFR: Estimated Creatinine Clearance: 63.5 mL/min (A) (by C-G formula based on SCr of 1.61 mg/dL (H)).  Liver Function Tests: Recent Labs  Lab 10/19/22 2357 10/20/22 0512 10/21/22 0822  AST 47* 18 14*  ALT 21 18 16   ALKPHOS 104 103 85  BILITOT 1.7* 0.6 0.6  PROT 6.8 6.1* 6.8  ALBUMIN 3.4* 3.0* 3.2*     Recent Labs  Lab 10/19/22 2357 10/21/22 0822  LIPASE 143* 40    CBG: Recent Labs  Lab 10/21/22 1747 10/21/22 2005 10/21/22 2315 10/22/22 0512 10/22/22 0740  GLUCAP 172* 170* 75 101* 101*       Radiology Studies: No results found.     LOS: 1 day   Randy [name] [employer] on [email or web address]  10/22/2022, 9:48 AM

## 2022-10-23 DIAGNOSIS — R112 Nausea with vomiting, unspecified: Secondary | ICD-10-CM | POA: Diagnosis not present

## 2022-10-23 LAB — BASIC METABOLIC PANEL
Anion gap: 7 (ref 5–15)
BUN: 11 mg/dL (ref 6–20)
CO2: 26 mmol/L (ref 22–32)
Calcium: 8.5 mg/dL — ABNORMAL LOW (ref 8.9–10.3)
Chloride: 103 mmol/L (ref 98–111)
Creatinine, Ser: 1.58 mg/dL — ABNORMAL HIGH (ref 0.61–1.24)
GFR, Estimated: 55 mL/min — ABNORMAL LOW (ref >60)
Glucose, Bld: 177 mg/dL — ABNORMAL HIGH (ref 70–99)
Potassium: 3.5 mmol/L (ref 3.5–5.1)
Sodium: 136 mmol/L (ref 135–145)

## 2022-10-23 LAB — GLUCOSE, CAPILLARY
Glucose-Capillary: 108 mg/dL — ABNORMAL HIGH (ref 70–99)
Glucose-Capillary: 124 mg/dL — ABNORMAL HIGH (ref 70–99)
Glucose-Capillary: 138 mg/dL — ABNORMAL HIGH (ref 70–99)
Glucose-Capillary: 170 mg/dL — ABNORMAL HIGH (ref 70–99)
Glucose-Capillary: 270 mg/dL — ABNORMAL HIGH (ref 70–99)
Glucose-Capillary: 79 mg/dL (ref 70–99)
Glucose-Capillary: 84 mg/dL (ref 70–99)
Glucose-Capillary: 91 mg/dL (ref 70–99)

## 2022-10-23 LAB — HEMOGLOBIN A1C
Hgb A1c MFr Bld: 11.2 % — ABNORMAL HIGH (ref 4.8–5.6)
Mean Plasma Glucose: 274.74 mg/dL

## 2022-10-23 LAB — MAGNESIUM: Magnesium: 1.9 mg/dL (ref 1.7–2.4)

## 2022-10-23 MED ORDER — INSULIN GLARGINE-YFGN 100 UNIT/ML ~~LOC~~ SOLN
25.0000 [IU] | Freq: Every day | SUBCUTANEOUS | Status: DC
  Administered 2022-10-24 – 2022-10-25 (×2): 25 [IU] via SUBCUTANEOUS
  Filled 2022-10-23 (×2): qty 0.25

## 2022-10-23 MED ORDER — INSULIN ASPART 100 UNIT/ML IJ SOLN
0.0000 [IU] | Freq: Three times a day (TID) | INTRAMUSCULAR | Status: DC
  Administered 2022-10-23: 1 [IU] via SUBCUTANEOUS
  Administered 2022-10-23: 2 [IU] via SUBCUTANEOUS
  Administered 2022-10-24: 3 [IU] via SUBCUTANEOUS
  Administered 2022-10-24: 5 [IU] via SUBCUTANEOUS

## 2022-10-23 MED ORDER — LIVING WELL WITH DIABETES BOOK
Freq: Once | Status: AC
  Filled 2022-10-23: qty 1

## 2022-10-23 NOTE — Inpatient Diabetes Management (Addendum)
Inpatient Diabetes Program Recommendations  AACE/ADA: New Consensus Statement on Inpatient Glycemic Control (2015)  Target Ranges:  Prepandial:   less than 140 mg/dL      Peak postprandial:   less than 180 mg/dL (1-2 hours)      Critically ill patients:  140 - 180 mg/dL   Lab Results  Component Value Date   GLUCAP 79 10/23/2022   HGBA1C 11.2 (H) 10/23/2022    Latest Reference Range & Units 10/22/22 07:40 10/22/22 12:01 10/22/22 16:37 10/22/22 20:25 10/22/22 20:47 10/23/22 00:28 10/23/22 04:33 10/23/22 08:14  Glucose-Capillary 70 - 99 mg/dL 409 (H) 811 (H) Novolog 1 unit 140 (H) Novolog 1 unit 40 (LL) 88 270 (H) Novolog 5 units 108 (H) 79  (LL): Data is critically low (H): Data is abnormally high  Latest Reference Range & Units 06/24/22 15:33 10/23/22 03:05  Hemoglobin A1C 4.8 - 5.6 % 9.8 ! 11.2 (H)  !: Data is abnormal (H): Data is abnormally high  Diabetes history: DM2 Outpatient Diabetes medications: Basaglar 34 units qd, Novolog 10 units tid meal coverage Current orders for Inpatient glycemic control: Semglee 28 units qd, Novolog 0-9 units q 4 hrs. correction  Inpatient Diabetes Program Recommendations:   Patient had hypoglycemia post Novolog correction. Please consider: -Decrease Semglee to 25 units qd -Decrease Novolog correction to 0-6 units q 4 hrs.  A1c increased from 9.8 to 11.2. Ordered Living Well With Diabetes. Will follow while hospitalized.  Thank you, Billy Fischer. Graeme Menees, RN, MSN, CDE  Diabetes Coordinator Inpatient Glycemic Control Team Team Pager [phone] (8am-5pm) 10/23/2022 8:54 AM

## 2022-10-23 NOTE — Hospital Course (Addendum)
Randy [name] is a 42 y.o. M with HTN, DM, CKD IIIa baseline 1.4-1.7, and recurrent vomiting episodes who presented with epigastric pain and found to have elevated lipase and CT findings of pancreatitis.    GI consulted, ruled out IgG4 disease, suspected cannabis hyperemesis syndrome leading to severe vomiting leading to dehydration leading to mild elevation in lipase and CT findings.

## 2022-10-23 NOTE — Assessment & Plan Note (Signed)
-  Continue Lipitor °

## 2022-10-23 NOTE — Progress Notes (Signed)
  Progress Note   Patient: Randy [name]. [id number] DOB: December 28, 1980 DOA: 10/19/2022     2 DOS: the patient was seen and examined on 10/23/2022 at 10:07AM      Brief hospital course: Randy [name] is a 42 y.o. M with HTN, DM, and recurrent vomiting episodes who presented with epigastric pain and found to have elevated lipase and CT findings of pancreatitis.    GI consulted, ruled out IgG4 disease, suspected cannabis hyperemesis syndrome leading to severe vomiting leading to dehydration leading to mild elevation in lipase and CT findings.     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   Gradually improving.   - Continue antiemetics     Essential hypertension BP elevated - Continue amlodipine, Coreg, hydralazine  Type 2 diabetes mellitus with hyperglycemia, with long-term current use of insulin (HCC) Glucose low - Reduce semglee - Reduce SS correction - Continue Lipitor  Chronic kidney disease stage IIIa Creatinine stable at 1.6        Subjective: No vomiting, still requiring IV Dilaudid every 6 hours.  No fever, no hematemesis, no confusion.  No nursing concerns.     Physical Exam: BP (!) 177/91 (BP Location: Right Arm)   Pulse 67   Temp 98 F (36.7 C) (Oral)   Resp 17   Ht 5\' 8"  (1.727 m)   Wt 87.1 kg   SpO2 100%   BMI 29.19 kg/m   Adult male, lying in bed, sleeping, mostly does not open his eyes for my exam, just rolls over sluggishly, no obvious distress   RRR, no murmurs, no peripheral edema Respiratory rate normal, lungs clear without rales or wheezes Abdomen soft, no guarding, no rebound or rigidity, no distention, he complains of mild diffuse pain resolved Attention sluggish due to recent dose of Dilaudid, moves all extremities with generalized weakness but symmetric strength    Data Reviewed: Basic metabolic panel shows creatinine stable at 1.6 Hemoglobin A1c  11.2%       Disposition: Status is: Inpatient Patient can discharge soon as he is able to tolerate oral diet        Author: Alberteen Sam, MD 10/23/2022 11:23 AM  For on call review [email or web address].

## 2022-10-23 NOTE — Progress Notes (Signed)
Hypoglycemic Event  CBG: 40  Treatment: 8 oz juice/soda  Symptoms: None  Follow-up CBG: Time:2047 CBG Result:88  Possible Reasons for Event: Inadequate meal intake  Comments/MD notified:YES    Randy [name]

## 2022-10-23 NOTE — Assessment & Plan Note (Signed)
Creatinine stable at 1.6

## 2022-10-24 DIAGNOSIS — R112 Nausea with vomiting, unspecified: Secondary | ICD-10-CM | POA: Diagnosis not present

## 2022-10-24 LAB — CBC
HCT: 35.4 % — ABNORMAL LOW (ref 39.0–52.0)
Hemoglobin: 11.3 g/dL — ABNORMAL LOW (ref 13.0–17.0)
MCH: 26.4 pg (ref 26.0–34.0)
MCHC: 31.9 g/dL (ref 30.0–36.0)
MCV: 82.7 fL (ref 80.0–100.0)
Platelets: 313 10*3/uL (ref 150–400)
RBC: 4.28 MIL/uL (ref 4.22–5.81)
RDW: 13.1 % (ref 11.5–15.5)
WBC: 7.6 10*3/uL (ref 4.0–10.5)
nRBC: 0 % (ref 0.0–0.2)

## 2022-10-24 LAB — BASIC METABOLIC PANEL
Anion gap: 9 (ref 5–15)
BUN: 10 mg/dL (ref 6–20)
CO2: 23 mmol/L (ref 22–32)
Calcium: 8.8 mg/dL — ABNORMAL LOW (ref 8.9–10.3)
Chloride: 103 mmol/L (ref 98–111)
Creatinine, Ser: 1.39 mg/dL — ABNORMAL HIGH (ref 0.61–1.24)
GFR, Estimated: >60 mL/min (ref >60)
Glucose, Bld: 73 mg/dL (ref 70–99)
Potassium: 3.5 mmol/L (ref 3.5–5.1)
Sodium: 135 mmol/L (ref 135–145)

## 2022-10-24 LAB — GLUCOSE, CAPILLARY
Glucose-Capillary: 80 mg/dL (ref 70–99)
Glucose-Capillary: 250 mg/dL — ABNORMAL HIGH (ref 70–99)
Glucose-Capillary: 80 mg/dL (ref 70–99)
Glucose-Capillary: 283 mg/dL — ABNORMAL HIGH (ref 70–99)
Glucose-Capillary: 74 mg/dL (ref 70–99)

## 2022-10-24 MED ORDER — HYDROMORPHONE HCL 2 MG PO TABS
2.0000 mg | ORAL_TABLET | Freq: Four times a day (QID) | ORAL | Status: DC | PRN
  Administered 2022-10-24 – 2022-10-25 (×3): 2 mg via ORAL
  Filled 2022-10-24 (×3): qty 1

## 2022-10-24 NOTE — Progress Notes (Signed)
  Progress Note   Patient: Randy [name]. [id number] DOB: 11-12-1980 DOA: 10/19/2022     3 DOS: the patient was seen and examined on 10/24/2022 at 8:32AM      Brief hospital course: Randy [name] is a 42 y.o. M with HTN, DM, CKD IIIa baseline 1.4-1.7, and recurrent vomiting episodes who presented with epigastric pain and found to have elevated lipase and CT findings of pancreatitis.    GI consulted, ruled out IgG4 disease, suspected cannabis hyperemesis syndrome leading to severe vomiting leading to dehydration leading to mild elevation in lipase and CT findings.     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   Gradually improving. Tolerating liquid diet for last 48 hours, pain yesterday with solid food. - Stop IV fluids - Continue antiemetics - Switch to oral analgesics   Stage 3a chronic kidney disease (CKD) (HCC) Creatinine stable at 1.6   HLD (hyperlipidemia) - Continue Lipitor  Essential hypertension BP remains elevated - Continue amlodipine, Coreg, hydralazine  - Follow up with PCP  Cannabis abuse    Type 2 diabetes mellitus with hyperglycemia, with long-term current use of insulin (HCC) Glucose labile - Continue semglee - Ciontinue SS correction - Continue Lipitor          Subjective: Pain resolved, although still asking for IV dilaudid four times daily.  No fever, no diarrhea.  Had BM yesterday.  No vomiting.     Physical Exam: BP (!) 143/88 (BP Location: Right Arm)   Pulse 68   Temp 98 F (36.7 C)   Resp 18   Ht 5\' 8"  (1.727 m)   Wt 87.1 kg   SpO2 100%   BMI 29.19 kg/m   Adult male, lying in bed, no acute distress RRR, no murmurs, no peripheral edema Respiratory rate normal, lungs clear without rales or wheezes Abdomen soft without tenderness palpation or guarding, no ascites or distention Attention normal, affect appropriate, judgment and insight normal, appears  tired and sleepy    Data Reviewed: Basic metabolic panel shows stable renal function CBC with mild anemia      Disposition: Status is: Inpatient         Author: Alberteen Sam, MD 10/24/2022 2:48 PM  For on call review [email or web address].

## 2022-10-25 ENCOUNTER — Other Ambulatory Visit: Payer: Self-pay | Admitting: Family Medicine

## 2022-10-25 ENCOUNTER — Other Ambulatory Visit (HOSPITAL_COMMUNITY): Payer: Self-pay

## 2022-10-25 DIAGNOSIS — R112 Nausea with vomiting, unspecified: Secondary | ICD-10-CM | POA: Diagnosis not present

## 2022-10-25 LAB — CBC
HCT: 32.8 % — ABNORMAL LOW (ref 39.0–52.0)
Hemoglobin: 10.6 g/dL — ABNORMAL LOW (ref 13.0–17.0)
MCH: 26.2 pg (ref 26.0–34.0)
MCHC: 32.3 g/dL (ref 30.0–36.0)
MCV: 81.2 fL (ref 80.0–100.0)
Platelets: 282 10*3/uL (ref 150–400)
RBC: 4.04 MIL/uL — ABNORMAL LOW (ref 4.22–5.81)
RDW: 12.6 % (ref 11.5–15.5)
WBC: 4.8 10*3/uL (ref 4.0–10.5)
nRBC: 0 % (ref 0.0–0.2)

## 2022-10-25 LAB — BASIC METABOLIC PANEL
Anion gap: 8 (ref 5–15)
BUN: 11 mg/dL (ref 6–20)
CO2: 26 mmol/L (ref 22–32)
Calcium: 8.6 mg/dL — ABNORMAL LOW (ref 8.9–10.3)
Chloride: 101 mmol/L (ref 98–111)
Creatinine, Ser: 1.58 mg/dL — ABNORMAL HIGH (ref 0.61–1.24)
GFR, Estimated: 55 mL/min — ABNORMAL LOW (ref >60)
Glucose, Bld: 126 mg/dL — ABNORMAL HIGH (ref 70–99)
Potassium: 3.7 mmol/L (ref 3.5–5.1)
Sodium: 135 mmol/L (ref 135–145)

## 2022-10-25 LAB — GLUCOSE, CAPILLARY
Glucose-Capillary: 89 mg/dL (ref 70–99)
Glucose-Capillary: 105 mg/dL — ABNORMAL HIGH (ref 70–99)

## 2022-10-25 MED ORDER — ATORVASTATIN CALCIUM 80 MG PO TABS: 80.0000 mg | ORAL_TABLET | Freq: Every day | ORAL | 1 refills | Status: DC

## 2022-10-25 MED ORDER — BASAGLAR KWIKPEN 100 UNIT/ML ~~LOC~~ SOPN
34.0000 [IU] | PEN_INJECTOR | Freq: Every day | SUBCUTANEOUS | 3 refills | Status: DC
Start: 2022-10-25 — End: 2023-06-02

## 2022-10-25 MED ORDER — INSULIN ASPART 100 UNIT/ML FLEXPEN: 10.0000 [IU] | PEN_INJECTOR | Freq: Three times a day (TID) | SUBCUTANEOUS | 2 refills | Status: DC

## 2022-10-25 MED ORDER — CARVEDILOL 25 MG PO TABS
25.0000 mg | ORAL_TABLET | Freq: Two times a day (BID) | ORAL | 3 refills | Status: DC
Start: 2022-10-25 — End: 2022-11-21

## 2022-10-25 MED ORDER — AMLODIPINE BESYLATE 10 MG PO TABS: 10.0000 mg | ORAL_TABLET | Freq: Every day | ORAL | 3 refills | Status: DC

## 2022-10-25 MED ORDER — HYDRALAZINE HCL 100 MG PO TABS: 100.0000 mg | ORAL_TABLET | Freq: Three times a day (TID) | ORAL | 3 refills | Status: DC

## 2022-10-25 NOTE — Plan of Care (Signed)
Patient AOX4, forgetful at times.  VSS throughout shift.  All meds given on time as ordered.  Pt c/o pain relieved by PRN dilaudid.  Diminished lungs, IS encouraged.  POC maintained, will continue to monitor.  Problem: Education: Goal: Ability to describe self-care measures that may prevent or decrease complications (Diabetes Survival Skills Education) will improve Outcome: Progressing Goal: Individualized Educational Video(s) Outcome: Progressing   Problem: Coping: Goal: Ability to adjust to condition or change in health will improve Outcome: Progressing   Problem: Fluid Volume: Goal: Ability to maintain a balanced intake and output will improve Outcome: Progressing   Problem: Health Behavior/Discharge Planning: Goal: Ability to identify and utilize available resources and services will improve Outcome: Progressing Goal: Ability to manage health-related needs will improve Outcome: Progressing   Problem: Metabolic: Goal: Ability to maintain appropriate glucose levels will improve Outcome: Progressing   Problem: Nutritional: Goal: Maintenance of adequate nutrition will improve Outcome: Progressing Goal: Progress toward achieving an optimal weight will improve Outcome: Progressing   Problem: Skin Integrity: Goal: Risk for impaired skin integrity will decrease Outcome: Progressing   Problem: Tissue Perfusion: Goal: Adequacy of tissue perfusion will improve Outcome: Progressing   Problem: Education: Goal: Knowledge of General Education information will improve Description: Including pain rating scale, medication(s)/side effects and non-pharmacologic comfort measures Outcome: Progressing   Problem: Health Behavior/Discharge Planning: Goal: Ability to manage health-related needs will improve Outcome: Progressing   Problem: Clinical Measurements: Goal: Ability to maintain clinical measurements within normal limits will improve Outcome: Progressing Goal: Will remain free  from infection Outcome: Progressing Goal: Diagnostic test results will improve Outcome: Progressing Goal: Respiratory complications will improve Outcome: Progressing Goal: Cardiovascular complication will be avoided Outcome: Progressing   Problem: Activity: Goal: Risk for activity intolerance will decrease Outcome: Progressing   Problem: Nutrition: Goal: Adequate nutrition will be maintained Outcome: Progressing   Problem: Coping: Goal: Level of anxiety will decrease Outcome: Progressing   Problem: Elimination: Goal: Will not experience complications related to bowel motility Outcome: Progressing Goal: Will not experience complications related to urinary retention Outcome: Progressing   Problem: Pain Managment: Goal: General experience of comfort will improve Outcome: Progressing   Problem: Safety: Goal: Ability to remain free from injury will improve Outcome: Progressing   Problem: Skin Integrity: Goal: Risk for impaired skin integrity will decrease Outcome: Progressing

## 2022-10-25 NOTE — Discharge Summary (Signed)
Physician Discharge Summary   Patient: Randy [name]. 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 M with HTN, DM, CKD IIIa baseline 1.4-1.7, and recurrent vomiting episodes who presented with epigastric pain and found to have elevated lipase and CT findings of pancreatitis.    GI consulted, ruled out IgG4 disease, suspected cannabis hyperemesis syndrome leading to severe vomiting leading to dehydration leading to mild elevation in lipase and CT findings.     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   Stage 3a chronic kidney disease (CKD) (HCC) Creatinine stable at 1.6    Essential hypertension BP elevated.   Continued on amlodipine, Coreg, hydralazine.  - Recommend PCP follow up for titration.     Type 2 diabetes mellitus with hyperglycemia, with long-term current use of insulin (HCC)            The [facility] Controlled Substances Registry was reviewed for this patient prior to discharge.  Consultants:  Gastroenterology   Disposition: Home Diet recommendation: Diabetic   DISCHARGE MEDICATION: Allergies as of 10/25/2022       Reactions   Tomato Itching        Medication List     TAKE these medications    amLODipine 10 MG tablet Commonly known as: NORVASC Take 1 tablet (10 mg total) by  mouth daily.   atorvastatin 80 MG tablet Commonly known as: LIPITOR Take 1 tablet (80 mg total) by mouth daily.   Basaglar KwikPen 100 UNIT/ML Inject 34 Units into the skin daily.   carvedilol 25 MG tablet Commonly known as: COREG Take 1 tablet (25 mg total) by mouth 2 (two) times daily with a meal.   FreeStyle Libre 2 Reader Devi Use to check blood glucose continuously.   FreeStyle Libre 2 Sensor Misc Use to check blood glucose continuously. Change sensors once every 14 days.   hydrALAZINE 100 MG tablet Commonly known as: APRESOLINE Take 1 tablet (100 mg total) by mouth every 8 (eight) hours.   insulin aspart 100 UNIT/ML FlexPen Commonly known as: NOVOLOG Inject 10 Units into the skin 3 (three) times daily with meals.   Ipratropium-Albuterol 20-100 MCG/ACT Aers respimat Commonly known as: COMBIVENT Inhale 1 puff into the lungs every 6 (six) hours as needed for wheezing or shortness of breath.   pantoprazole 40 MG tablet Commonly known as: Protonix Take 1 tablet (40 mg total) by mouth 2 (two) times daily.   TRUEplus 5-Bevel Pen Needles 32G X 4 MM Misc Generic drug: Insulin Pen Needle Use to inject basaglar once daily.        Follow-up Information     [facility], [facility]   Specialty: Family Medicine Contact information: [address] [place] [address] [phone]  Discharge Instructions     Discharge instructions   Complete by: As directed    Follow up with your PCP at [facility] in 1 week Call them today for an appointment next week  Resume your home blood pressure medicines: Amlodipine/Norvasc 10 mg daily Carvedilol/Coreg 25 mg twice daily Hydralazine 100 mg three times daily  Resume your cholesterol medicine atorvastatin 80 mg nightly  Resume your insulins   Increase activity slowly   Complete by: As directed        Discharge Exam: Filed Weights   10/19/22 2336   Weight: 87.1 kg    General: Pt is alert, awake, not in acute distress Cardiovascular: RRR, nl S1-S2, no murmurs appreciated.   No LE edema.   Respiratory: Normal respiratory rate and rhythm.  CTAB without rales or wheezes. Abdominal: Abdomen soft and non-tender.  No distension or HSM.   Neuro/Psych: Strength symmetric in upper and lower extremities.  Judgment and insight appear normal .   Condition at discharge: good  The results of significant diagnostics from this hospitalization (including imaging, microbiology, ancillary and laboratory) are listed below for reference.   Imaging Studies: CT Angio Chest/Abd/Pel for Dissection W and/or Wo Contrast  Result Date: 10/20/2022 CLINICAL DATA:  Patient believes he is having a pancreas flare-up. Acute aortic syndrome suspected EXAM: CT ANGIOGRAPHY CHEST, ABDOMEN AND PELVIS TECHNIQUE: Non-contrast CT of the chest was initially obtained. Multidetector CT imaging through the chest, abdomen and pelvis was performed using the standard protocol during bolus administration of intravenous contrast. Multiplanar reconstructed images and MIPs were obtained and reviewed to evaluate the vascular anatomy. RADIATION DOSE REDUCTION: This exam was performed according to the departmental dose-optimization program which includes automated exposure control, adjustment of the mA and/or kV according to patient size and/or use of iterative reconstruction technique. CONTRAST:  75mL OMNIPAQUE IOHEXOL 350 MG/ML SOLN COMPARISON:  Chest radiograph 10/20/2022; CTA chest abdomen and pelvis 09/12/2020 and CT abdomen and pelvis 05/20/2022 FINDINGS: CTA CHEST FINDINGS Cardiovascular: No acute aortic syndrome. Borderline cardiomegaly. No pericardial effusion. Mediastinum/Nodes: No enlarged mediastinal, hilar, or axillary lymph nodes. Thyroid gland, trachea, and esophagus demonstrate no significant findings. Lungs/Pleura: Lungs are clear. No pleural effusion or pneumothorax.  Musculoskeletal: No chest wall abnormality. No acute or significant osseous findings. Review of the MIP images confirms the above findings. CTA ABDOMEN AND PELVIS FINDINGS VASCULAR Aorta: Normal caliber aorta without aneurysm, dissection, vasculitis or significant stenosis. Celiac: Patent without evidence of aneurysm, dissection, vasculitis or significant stenosis. SMA: Patent without evidence of aneurysm, dissection, vasculitis or significant stenosis. Renals: Both renal arteries are patent without evidence of aneurysm, dissection, vasculitis, fibromuscular dysplasia or significant stenosis. IMA: Patent without evidence of aneurysm, dissection, vasculitis or significant stenosis. Inflow: Patent without evidence of aneurysm, dissection, vasculitis or significant stenosis. Veins: No obvious venous abnormality within the limitations of this arterial phase study. Review of the MIP images confirms the above findings. NON-VASCULAR Hepatobiliary: No focal liver abnormality is seen. No gallstones, gallbladder wall thickening, or biliary dilatation. Pancreas: Mild edema and subtle stranding about the pancreatic head. No fluid collection. No ductal dilation. Spleen: Unremarkable. Adrenals/Urinary Tract: Normal adrenal glands. No urinary calculi or hydronephrosis. Unremarkable bladder. Stomach/Bowel: Normal caliber large and small bowel. Normal appendix. No bowel wall thickening. Stomach is within normal limits. Lymphatic: No lymphadenopathy. Reproductive: Unremarkable. Other: No free intraperitoneal air or abdominal hernia. Musculoskeletal: No acute or significant osseous findings. Review of the MIP images confirms the above findings. IMPRESSION: 1. No acute aortic syndrome. 2.  Mild edema and subtle stranding about the pancreatic head. 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 Electronically Signed   By: Minerva Fester M.D.   On: 10/20/2022 02:52   CT Head Wo Contrast  Result Date:  10/20/2022 CLINICAL DATA:  Headache, neuro deficit EXAM: CT HEAD WITHOUT CONTRAST TECHNIQUE: Contiguous axial images were obtained from the base of the skull through the vertex without intravenous contrast. RADIATION DOSE REDUCTION: This exam was performed according to the departmental dose-optimization program which includes automated exposure control, adjustment of the mA and/or kV according to patient size and/or use of iterative reconstruction technique. COMPARISON:  None Available. FINDINGS: Brain: No evidence of acute infarction, hemorrhage, hydrocephalus, extra-axial collection or mass lesion/mass effect. Vascular: No hyperdense vessel or unexpected calcification. Skull: Normal. Negative for fracture or focal lesion. Sinuses/Orbits: No acute finding. Other: Mastoid air cells and middle ear cavities are clear IMPRESSION: 1. No acute intracranial abnormality. Electronically Signed   By: Helyn Numbers M.D.   On: 10/20/2022 02:30   DG Chest 2 View  Result Date: 10/20/2022 CLINICAL DATA:  Chest pain. EXAM: CHEST - 2 VIEW COMPARISON:  Sep 21, 2022 FINDINGS: The heart size and mediastinal contours are within normal limits. Both lungs are clear. The visualized skeletal structures are unremarkable. IMPRESSION: No active cardiopulmonary disease. Electronically Signed   By: Aram Candela M.D.   On: 10/20/2022 00:48    Microbiology: Results for orders placed or performed during the hospital encounter of 05/27/22  Culture, blood (Routine X 2) w Reflex to ID Panel     Status: None   Collection Time: 05/29/22  8:51 AM   Specimen: BLOOD  Result Value Ref Range Status   Specimen Description   Final    BLOOD BLOOD RIGHT ARM Performed at [facility], [address], [place], [place] [address]    Special Requests   Final    BOTTLES DRAWN AEROBIC AND ANAEROBIC Blood Culture adequate volume Performed at [facility], [address] [place], [place] [address]    Culture    Final    NO GROWTH 5 DAYS Performed at [facility], [address] [place], [place] [address]    Report Status 06/03/2022 FINAL  Final  Culture, blood (Routine X 2) w Reflex to ID Panel     Status: None   Collection Time: 05/29/22 10:09 AM   Specimen: BLOOD  Result Value Ref Range Status   Specimen Description   Final    BLOOD BLOOD LEFT ARM Performed at [facility], [address] [place], [place] [address]    Special Requests   Final    BOTTLES DRAWN AEROBIC ONLY Blood Culture adequate volume Performed at [facility], [address] [place], [place] [address]    Culture   Final    NO GROWTH 5 DAYS Performed at [facility], [address], [place], [place] [address]    Report Status 06/03/2022 FINAL  Final    Labs: CBC: Recent Labs  Lab 10/19/22 2357 10/20/22 0512 10/22/22 0636 10/24/22 0425 10/25/22 0504  WBC 6.4 6.6 8.0 7.6 4.8  HGB 11.4* 10.2* 11.2* 11.3* 10.6*  HCT 35.5* 31.8* 34.8* 35.4* 32.8*  MCV 85.1 84.4 81.7 82.7 81.2  PLT 251 304 323 313 282   Basic Metabolic Panel: Recent Labs  Lab 10/21/22 0822 10/22/22 0636 10/23/22 0305 10/24/22 0425 10/25/22 0504  NA 134* 138 136 135 135  K  3.6 3.4* 3.5 3.5 3.7  CL 100 103 103 103 101  CO2 22 23 26 23 26   GLUCOSE 185* 97 177* 73 126*  BUN 19 15 11 10 11   CREATININE 1.80* 1.61* 1.58* 1.39* 1.58*  CALCIUM 8.7* 8.8* 8.5* 8.8* 8.6*  MG  --   --  1.9  --   --    Liver Function Tests: Recent Labs  Lab 10/19/22 2357 10/20/22 0512 10/21/22 0822  AST 47* 18 14*  ALT 21 18 16   ALKPHOS 104 103 85  BILITOT 1.7* 0.6 0.6  PROT 6.8 6.1* 6.8  ALBUMIN 3.4* 3.0* 3.2*   CBG: Recent Labs  Lab 10/24/22 1157 10/24/22 1541 10/24/22 2049 10/25/22 0107 10/25/22 0810  GLUCAP 80 283* 74 89 105*    Discharge time spent: approximately 35 minutes spent on discharge counseling, evaluation of patient on day of discharge, and coordination of discharge planning  with nursing, social work, pharmacy and case management  Signed: Alberteen Sam, MD [facility] 10/25/2022

## 2022-10-25 NOTE — TOC Benefit Eligibility Note (Signed)
Pharmacy Patient Advocate Encounter  Insurance verification completed.    The patient is insured through [employer]  Ran test claim for [employer] and Requires Prior Authorization  Ran test claim for [employer] and Requires Prior Authorization   This test claim was processed through [employer]- copay amounts may vary at other pharmacies due to [employer], or as the patient moves through the different stages of their insurance plan.    Jovin Earl, CPHT Pharmacy Patient Advocate Specialist [facility] Patient Advocate Team Direct Number: [phone]  Fax: [phone]

## 2022-10-25 NOTE — Inpatient Diabetes Management (Signed)
Inpatient Diabetes Program Recommendations  AACE/ADA: New Consensus Statement on Inpatient Glycemic Control (2015)  Target Ranges:  Prepandial:   less than 140 mg/dL      Peak postprandial:   less than 180 mg/dL (1-2 hours)      Critically ill patients:  140 - 180 mg/dL   Lab Results  Component Value Date   GLUCAP 105 (H) 10/25/2022   HGBA1C 11.2 (H) 10/23/2022    Review of Glycemic Control  Latest Reference Range & Units 10/24/22 20:49 10/25/22 01:07 10/25/22 08:10  Glucose-Capillary 70 - 99 mg/dL 74 89 161 (H)  (H): Data is abnormally high Diabetes history: DM2 Outpatient Diabetes medications: Basaglar 34 units qd, Novolog 10 units tid meal coverage Current orders for Inpatient glycemic control: Semglee 25 units qd, Novolog 0-9 units TID   Inpatient Diabetes Program Recommendations:     Spoke with patient regarding outpatient diabetes management. Patient has not been taking Novolog with meals and will occassionally miss doses.  Reviewed patient's current A1c of 11.2%. Explained what a A1c is and what it measures. Also reviewed goal A1c with patient, importance of good glucose control @ home, and blood sugar goals. Reviewed patho of DM, need for improvement to glycemic control, survival skills, interventions, vascular changes and commorbidities.  Patient will need a meter and testing supplies. Secure message sent to MD. Reviewed recommended frequency and when to follow up with PCP. Discussed with patient CGMs, however, did not have an applicable device and insurance was requiring PA.  Denies drinking sugary beverages. Reviewed foods that were higher in CHO and the importance of being mindful.  Reviewed importance of taking medications as prescribed and when to reach out to PCP. Patient to make an appointment with [facility].   Thanks, Lujean Rave, MSN, RNC-OB Diabetes Coordinator [phone] (8a-5p)

## 2022-10-25 NOTE — TOC Transition Note (Signed)
Transition of Care [facility]) - CM/SW Discharge Note   Patient Details  Name: Randy [name]. MRN: [id number] Date of Birth: [birth year]  Transition of Care [facility] CM/SW Contact:  Gordy Clement, RN Phone Number: 10/25/2022, 11:40 AM   Clinical Narrative:    Patient will DC to home today.  No TOC needs have been identified. Family will transport      Final next level of care: Home/Self Care Barriers to Discharge: Continued Medical Work up   Patient Goals and CMS Choice CMS Medicare.gov Compare Post Acute Care list provided to:: Patient Choice offered to / list presented to : Patient  Discharge Placement                         Discharge Plan and Services Additional resources added to the After Visit Summary for     Discharge Planning Services: CM Consult                                 Social Determinants of Health (SDOH) Interventions SDOH Screenings   Food Insecurity: No Food Insecurity (10/20/2022)  Housing: Low Risk  (10/20/2022)  Transportation Needs: No Transportation Needs (10/20/2022)  Utilities: Not At Risk (10/20/2022)  Alcohol Screen: Low Risk  (07/18/2022)  Depression (PHQ2-9): Low Risk  (07/18/2022)  Financial Resource Strain: Low Risk  (07/18/2022)  Physical Activity: Inactive (07/18/2022)  Social Connections: Moderately Integrated (07/18/2022)  Stress: No Stress Concern Present (07/18/2022)  Tobacco Use: Medium Risk (09/21/2022)     Readmission Risk Interventions    10/22/2022   11:12 AM 05/12/2022    9:27 AM 01/06/2022    1:32 PM  Readmission Risk Prevention Plan  Transportation Screening Complete Complete Complete  PCP or Specialist Appt within 3-5 Days   Not Complete  Not Complete comments   pt will need to call for appointment  HRI or Home Care Consult   Complete  Social Work Consult for Recovery Care Planning/Counseling   Complete  Palliative Care Screening   Not Applicable  Medication Review [employer]) Complete Complete Complete  PCP  or Specialist appointment within 3-5 days of discharge Complete Complete   HRI or Home Care Consult Complete Complete   SW Recovery Care/Counseling Consult Complete Complete   Palliative Care Screening Not Applicable Not Applicable   Skilled Nursing Facility Not Applicable Not Applicable

## 2022-10-28 ENCOUNTER — Telehealth: Payer: Self-pay

## 2022-10-28 ENCOUNTER — Telehealth: Payer: Self-pay | Admitting: Family Medicine

## 2022-10-28 NOTE — Telephone Encounter (Signed)
Copied from CRM [id number]. Topic: General - Other >> Oct 28, 2022 11:29 AM Macon Large wrote: Reason for CRM: Pt mother stated that she received a call for the patient asking that the call be returned so she was returning the call.

## 2022-10-28 NOTE — Transitions of Care (Post Inpatient/ED Visit) (Signed)
   10/28/2022  Name: Randy [name]. MRN: [id number] DOB: 1980/12/08  Today's TOC FU Call Status: Today's TOC FU Call Status:: Unsuccessul Call (1st Attempt) Unsuccessful Call (1st Attempt) Date: 10/28/22  Attempted to reach the patient regarding the most recent Inpatient/ED visit.  Follow Up Plan: Additional outreach attempts will be made to reach the patient to complete the Transitions of Care (Post Inpatient/ED visit) call.   Signature Robyne Peers, RN

## 2022-10-28 NOTE — Telephone Encounter (Signed)
Call returned to [place] : [phone] , voicemail message left with call back requested

## 2022-10-29 ENCOUNTER — Telehealth: Payer: Self-pay

## 2022-10-29 NOTE — Transitions of Care (Post Inpatient/ED Visit) (Signed)
   10/29/2022  Name: Randy [name]. MRN: [id number] DOB: January 07, 1981  Today's TOC FU Call Status: Today's TOC FU Call Status:: Unsuccessful Call (2nd Attempt) Unsuccessful Call (1st Attempt) Date: 10/28/22 Unsuccessful Call (2nd Attempt) Date: 10/29/22  Attempted to reach the patient regarding the most recent Inpatient/ED visit.  Follow Up Plan: Additional outreach attempts will be made to reach the patient to complete the Transitions of Care (Post Inpatient/ED visit) call.   Signature   Audie Box

## 2022-10-30 ENCOUNTER — Telehealth: Payer: Self-pay

## 2022-10-30 NOTE — Transitions of Care (Post Inpatient/ED Visit) (Signed)
   10/30/2022  Name: Randy [name]. MRN: [id number] DOB: 05-Feb-1981  Today's TOC FU Call Status: Today's TOC FU Call Status:: Unsuccessful Call (2nd Attempt) Unsuccessful Call (2nd Attempt) Date: 10/30/22  Attempted to reach the patient regarding the most recent Inpatient/ED visit.  Follow Up Plan: Additional outreach attempts will be made to reach the patient to complete the Transitions of Care (Post Inpatient/ED visit) call.   Signature  TB,CMA

## 2022-10-30 NOTE — Transitions of Care (Post Inpatient/ED Visit) (Signed)
   10/30/2022  Name: Randy [name]. MRN: [id number] DOB: [birth year]  Today's TOC FU Call Status: Today's TOC FU Call Status:: Unsuccessful Call (3rd Attempt) Unsuccessful Call (1st Attempt) Date: 10/28/22 Unsuccessful Call (2nd Attempt) Date: 10/29/22 Unsuccessful Call (3rd Attempt) Date: 10/30/22  Attempted to reach the patient regarding the most recent Inpatient/ED visit.  Follow Up Plan: No further outreach attempts will be made at this time. We have been unable to contact the patient.  Letter sent to patient requesting he contact [facility] to schedule a follow up appointment as we have not been able to reach him.   Signature  Robyne Peers, RN

## 2022-10-31 ENCOUNTER — Telehealth: Payer: Self-pay

## 2022-10-31 NOTE — Transitions of Care (Post Inpatient/ED Visit) (Signed)
   10/31/2022  Name: Randy [name]. MRN: [id number] DOB: 01-Oct-1980  Today's TOC FU Call Status: Today's TOC FU Call Status:: Unsuccessful Call (3rd Attempt) Unsuccessful Call (3rd Attempt) Date: 10/31/22  Attempted to reach the patient regarding the most recent Inpatient/ED visit.  Follow Up Plan: No further outreach attempts will be made at this time. We have been unable to contact the patient.  Signature  TB,CMA

## 2022-11-06 NOTE — Telephone Encounter (Signed)
Call returned to patient's mother and she said she was returning my call. No DPR to speak with her, so she put the patient on the phone. He said he is doing well, his sugars are good and he needed an appointment with PCP.  I scheduled him with Dr Andrey Campanile - 11/21/2022 @ 1040 and he said he has a ride to the appointment,

## 2022-11-06 NOTE — Telephone Encounter (Signed)
Copied from CRM [id number]. Topic: General - Other >> Nov 05, 2022  3:21 PM Santiya F wrote: Reason for CRM: Pt's mother is calling in returning a call from [place].

## 2022-11-11 ENCOUNTER — Other Ambulatory Visit: Payer: Self-pay | Admitting: Pharmacist

## 2022-11-11 ENCOUNTER — Other Ambulatory Visit: Payer: Self-pay

## 2022-11-11 DIAGNOSIS — Z794 Long term (current) use of insulin: Secondary | ICD-10-CM

## 2022-11-11 MED ORDER — TRUEPLUS 5-BEVEL PEN NEEDLES 32G X 4 MM MISC
2 refills | Status: DC
Start: 2022-11-11 — End: 2022-11-12

## 2022-11-12 ENCOUNTER — Other Ambulatory Visit: Payer: Self-pay | Admitting: Pharmacist

## 2022-11-12 ENCOUNTER — Other Ambulatory Visit: Payer: Self-pay

## 2022-11-12 DIAGNOSIS — E1165 Type 2 diabetes mellitus with hyperglycemia: Secondary | ICD-10-CM

## 2022-11-12 MED ORDER — TRUEPLUS 5-BEVEL PEN NEEDLES 32G X 4 MM MISC
2 refills | Status: DC
Start: 2022-11-12 — End: 2023-12-18
  Filled 2022-11-12 – 2022-11-19 (×2): qty 100, 25d supply, fill #0
  Filled 2022-11-20: qty 200, 50d supply, fill #0
  Filled 2023-03-24: qty 100, 25d supply, fill #1
  Filled 2023-03-24: qty 200, 50d supply, fill #1
  Filled 2023-08-25 – 2023-10-01 (×2): qty 100, 25d supply, fill #2

## 2022-11-18 ENCOUNTER — Other Ambulatory Visit: Payer: Self-pay | Admitting: Family Medicine

## 2022-11-18 ENCOUNTER — Other Ambulatory Visit: Payer: Self-pay

## 2022-11-18 DIAGNOSIS — I10 Essential (primary) hypertension: Secondary | ICD-10-CM

## 2022-11-18 DIAGNOSIS — N529 Male erectile dysfunction, unspecified: Secondary | ICD-10-CM

## 2022-11-18 NOTE — Telephone Encounter (Signed)
Amlodipine with 3 refills prescribed on 10/25/2022 by Joen Laura, MD.

## 2022-11-19 ENCOUNTER — Telehealth: Payer: Self-pay | Admitting: Family Medicine

## 2022-11-19 ENCOUNTER — Other Ambulatory Visit: Payer: Self-pay

## 2022-11-19 NOTE — Telephone Encounter (Signed)
Requested by interface surescripts. Medications discontinued, viagra D/C 01/06/22, norvasc 5 mg d/c 05/12/22. Now taking norvasc 10 mg.  Requested Prescriptions  Refused Prescriptions Disp Refills   sildenafil (VIAGRA) 100 MG tablet 10 tablet 11    Sig: TAKE 0.5-1 TABLETS (50-100 MG TOTAL) BY MOUTH DAILY AS NEEDED FOR ERECTILE DYSFUNCTION.     Urology: Erectile Dysfunction Agents Failed - 11/18/2022  2:34 PM      Failed - AST in normal range and within 360 days    AST  Date Value Ref Range Status  10/21/2022 14 (L) 15 - 41 U/L Final         Failed - Last BP in normal range    BP Readings from Last 1 Encounters:  10/25/22 (!) 188/97         Passed - ALT in normal range and within 360 days    ALT  Date Value Ref Range Status  10/21/2022 16 0 - 44 U/L Final         Passed - Valid encounter within last 12 months    Recent Outpatient Visits           2 months ago Type 2 diabetes mellitus with hyperglycemia, with long-term current use of insulin [facility])   [place] [facility] [place], [place] L, RPH-CPP   4 months ago Type 2 diabetes mellitus with hyperglycemia, with long-term current use of insulin [facility])   [place] [facility] [place], [place] L, RPH-CPP   4 months ago 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, with long-term current use of insulin (HCC)   [place] [facility], MD   1 year ago Hospital discharge follow-up   [facility] at [facility], [place], NP   2 years ago Essential hypertension   [place] [facility], [place], NP       Future Appointments             In 2 days Georganna Skeans, MD [facility]             amLODipine (NORVASC) 5 MG tablet 60 tablet 1    Sig: Take 2 tablets (10 mg total) by mouth daily.     Cardiovascular: Calcium Channel  Blockers 2 Failed - 11/18/2022  2:34 PM      Failed - Last BP in normal range    BP Readings from Last 1 Encounters:  10/25/22 (!) 188/97         Passed - Last Heart Rate in normal range    Pulse Readings from Last 1 Encounters:  10/25/22 80         Passed - Valid encounter within last 6 months    Recent Outpatient Visits           2 months ago Type 2 diabetes mellitus with hyperglycemia, with long-term current use of insulin [facility])   [place] [facility] [place], Calhan L, RPH-CPP   4 months ago Type 2 diabetes mellitus with hyperglycemia, with long-term current use of insulin [facility])    [facility] [place], Francisco L, RPH-CPP   4 months ago 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

## 2022-11-19 NOTE — Telephone Encounter (Signed)
Called patient to let he know that there are refills for Amlodipine 10 mg at [facility], [place]. Also, that patient would need to attend OV 11/21/22 to receive refills of Viagra. No answer. Left VM to call back.

## 2022-11-19 NOTE — Telephone Encounter (Signed)
Created in error

## 2022-11-19 NOTE — Telephone Encounter (Signed)
Attempted to contact patient [phone] as listed in chart. No answer, did not leave message to call back due to male voice to leave message. Due to HIPAA restrictions did not leave message regarding questions for medication refills. OV scheduled 11/21/22.

## 2022-11-19 NOTE — Telephone Encounter (Signed)
Medication Refill - Medication: sildenafil (VIAGRA) 100 MG tablet  amLODipine (NORVASC) 5 MG tablet   Pt has an appt for 11/21/22 with PCP and is wanting to see if he can get his medications refilled.   Has the patient contacted their pharmacy? Yes.   Preferred Pharmacy (with phone number or street name):  sildenafil (VIAGRA) 100 MG tablet  [facility] [address], [place] [address] Phone: [phone]  Fax: [phone]    amLODipine [facility]) 5 MG tablet  [id number] - [place] ([place]), [place] - [address], [place] [address] Phone: [phone]  Fax: [phone]   Has the patient been seen for an appointment in the last year OR does the patient have an upcoming appointment? Yes.    Agent: Please be advised that RX refills may take up to 3 business days. We ask that you follow-up with your pharmacy.

## 2022-11-20 ENCOUNTER — Other Ambulatory Visit: Payer: Self-pay

## 2022-11-21 ENCOUNTER — Ambulatory Visit (INDEPENDENT_AMBULATORY_CARE_PROVIDER_SITE_OTHER): Payer: 59 | Admitting: Family Medicine

## 2022-11-21 ENCOUNTER — Other Ambulatory Visit: Payer: Self-pay

## 2022-11-21 ENCOUNTER — Encounter: Payer: Self-pay | Admitting: Family Medicine

## 2022-11-21 VITALS — BP 169/100 | HR 63 | Temp 98.0°F | Resp 14 | Ht 68.0 in | Wt 197.0 lb

## 2022-11-21 DIAGNOSIS — Z794 Long term (current) use of insulin: Secondary | ICD-10-CM

## 2022-11-21 DIAGNOSIS — I10 Essential (primary) hypertension: Secondary | ICD-10-CM

## 2022-11-21 DIAGNOSIS — E785 Hyperlipidemia, unspecified: Secondary | ICD-10-CM

## 2022-11-21 DIAGNOSIS — Z09 Encounter for follow-up examination after completed treatment for conditions other than malignant neoplasm: Secondary | ICD-10-CM

## 2022-11-21 DIAGNOSIS — E1169 Type 2 diabetes mellitus with other specified complication: Secondary | ICD-10-CM

## 2022-11-21 DIAGNOSIS — N529 Male erectile dysfunction, unspecified: Secondary | ICD-10-CM

## 2022-11-21 DIAGNOSIS — E113552 Type 2 diabetes mellitus with stable proliferative diabetic retinopathy, left eye: Secondary | ICD-10-CM | POA: Diagnosis not present

## 2022-11-21 DIAGNOSIS — K219 Gastro-esophageal reflux disease without esophagitis: Secondary | ICD-10-CM

## 2022-11-21 MED ORDER — SILDENAFIL CITRATE 100 MG PO TABS
50.0000 mg | ORAL_TABLET | Freq: Every day | ORAL | 11 refills | Status: DC | PRN
  Filled 2022-11-21: qty 5, 15d supply, fill #0
  Filled 2023-04-15 – 2023-11-17 (×3): qty 5, 15d supply, fill #1

## 2022-11-21 MED ORDER — PANTOPRAZOLE SODIUM 40 MG PO TBEC
40.0000 mg | DELAYED_RELEASE_TABLET | Freq: Two times a day (BID) | ORAL | 1 refills | Status: AC
  Filled 2022-11-21: qty 60, 30d supply, fill #0
  Filled 2023-04-15 – 2023-08-25 (×2): qty 60, 30d supply, fill #1

## 2022-11-21 MED ORDER — CARVEDILOL 25 MG PO TABS
25.0000 mg | ORAL_TABLET | Freq: Two times a day (BID) | ORAL | 1 refills | Status: DC
Start: 2022-11-21 — End: 2023-09-04
  Filled 2022-11-21: qty 60, 30d supply, fill #0
  Filled 2023-04-15 – 2023-08-25 (×2): qty 60, 30d supply, fill #1

## 2022-11-21 MED ORDER — HYDRALAZINE HCL 100 MG PO TABS
100.0000 mg | ORAL_TABLET | Freq: Three times a day (TID) | ORAL | 1 refills | Status: DC
  Filled 2022-11-21: qty 90, 30d supply, fill #0

## 2022-11-21 MED ORDER — AMLODIPINE BESYLATE 10 MG PO TABS
10.0000 mg | ORAL_TABLET | Freq: Every day | ORAL | 1 refills | Status: DC
  Filled 2022-11-21: qty 90, 90d supply, fill #0
  Filled 2022-11-21: qty 30, 30d supply, fill #0
  Filled 2023-08-25: qty 90, 90d supply, fill #0

## 2022-11-21 MED ORDER — ATORVASTATIN CALCIUM 80 MG PO TABS
80.0000 mg | ORAL_TABLET | Freq: Every day | ORAL | 1 refills | Status: DC
  Filled 2022-11-21: qty 30, 30d supply, fill #0
  Filled 2023-04-15 – 2023-08-25 (×2): qty 30, 30d supply, fill #1

## 2022-11-21 MED ORDER — IPRATROPIUM-ALBUTEROL 20-100 MCG/ACT IN AERS
1.0000 | INHALATION_SPRAY | Freq: Four times a day (QID) | RESPIRATORY_TRACT | 5 refills | Status: DC | PRN
  Filled 2022-11-21: qty 4, 30d supply, fill #0

## 2022-11-21 NOTE — Telephone Encounter (Signed)
Requested medications are due for refill today.  unsure  Requested medications are on the active medications list.  unsure  Last refill. 01/07/2022  Future visit scheduled.   yes  Notes to clinic.  Rx signed by Lacretia Nicks.  Per refill request d/c'd 05/20/2022.    Requested Prescriptions  Pending Prescriptions Disp Refills   TRUEplus Lancets 28G MISC 100 each 0    Sig: USE UP TO 4 TIMES DAILY     Endocrinology: Diabetes - Testing Supplies Passed - 11/18/2022  2:24 PM      Passed - Valid encounter within last 12 months    Recent Outpatient Visits           2 months ago Type 2 diabetes mellitus with hyperglycemia, with long-term current use of insulin [facility])   [place] [facility] [place],  [place] L, RPH-CPP   4 months ago Type 2 diabetes mellitus with hyperglycemia, with long-term current use of insulin [facility])   [facility] [place], Jeannett Senior L, RPH-CPP   5 months ago 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

## 2022-11-21 NOTE — Progress Notes (Signed)
Pt is here for hos f/u   Requesting refills on all of his medications

## 2022-11-25 ENCOUNTER — Encounter: Payer: Self-pay | Admitting: Family Medicine
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
## 2022-11-27 ENCOUNTER — Other Ambulatory Visit: Payer: Self-pay

## 2022-11-28 ENCOUNTER — Other Ambulatory Visit: Payer: Self-pay

## 2022-12-02 ENCOUNTER — Ambulatory Visit (INDEPENDENT_AMBULATORY_CARE_PROVIDER_SITE_OTHER): Payer: 59 | Admitting: Family Medicine

## 2022-12-02 ENCOUNTER — Encounter: Payer: Self-pay | Admitting: Family Medicine

## 2022-12-02 VITALS — BP 120/78 | HR 77 | Temp 98.1°F | Resp 16 | Wt 194.0 lb

## 2022-12-02 DIAGNOSIS — Z01818 Encounter for other preprocedural examination: Secondary | ICD-10-CM | POA: Diagnosis not present

## 2022-12-02 MED ORDER — FREESTYLE LIBRE 3 SENSOR MISC: 0 refills | Status: DC

## 2022-12-02 NOTE — Progress Notes (Signed)
Paper for [facility] Medical Clearance eye surgery paper

## 2022-12-03 ENCOUNTER — Other Ambulatory Visit: Payer: Self-pay

## 2022-12-11 ENCOUNTER — Other Ambulatory Visit: Payer: Self-pay

## 2022-12-12 ENCOUNTER — Encounter: Payer: Self-pay | Admitting: Family Medicine
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
## 2022-12-18 ENCOUNTER — Telehealth: Payer: Self-pay | Admitting: Family Medicine

## 2022-12-18 NOTE — Telephone Encounter (Signed)
Reason for CRM: Randy [name] with [facility] called for an update on the medical clearance. Randy [name] stated the requests was sent on 11/27/22 and 12/11/22. Cb# [id number] Ext. [phone]

## 2022-12-18 NOTE — Telephone Encounter (Signed)
I have attempted without success to contact this Randy [name] with [facility] by phone to I left a message on answering machine.

## 2022-12-19 ENCOUNTER — Other Ambulatory Visit: Payer: Self-pay

## 2022-12-20 ENCOUNTER — Other Ambulatory Visit: Payer: Self-pay

## 2022-12-24 ENCOUNTER — Ambulatory Visit: Payer: 59 | Admitting: Family Medicine

## 2022-12-30 ENCOUNTER — Other Ambulatory Visit: Payer: Self-pay

## 2022-12-31 ENCOUNTER — Other Ambulatory Visit: Payer: Self-pay

## 2023-01-02 ENCOUNTER — Other Ambulatory Visit: Payer: Self-pay

## 2023-01-15 ENCOUNTER — Ambulatory Visit: Payer: 59 | Admitting: Family Medicine

## 2023-01-15 ENCOUNTER — Telehealth: Payer: Self-pay | Admitting: Family Medicine

## 2023-01-15 NOTE — Telephone Encounter (Signed)
Called patient to reschedule missed appointment.

## 2023-01-16 ENCOUNTER — Other Ambulatory Visit: Payer: Self-pay

## 2023-01-17 ENCOUNTER — Other Ambulatory Visit: Payer: Self-pay

## 2023-01-22 ENCOUNTER — Other Ambulatory Visit: Payer: Self-pay

## 2023-01-29 ENCOUNTER — Ambulatory Visit (INDEPENDENT_AMBULATORY_CARE_PROVIDER_SITE_OTHER): Payer: 59 | Admitting: Family Medicine

## 2023-01-29 ENCOUNTER — Emergency Department (HOSPITAL_BASED_OUTPATIENT_CLINIC_OR_DEPARTMENT_OTHER)
Admission: EM | Admit: 2023-01-29 | Discharge: 2023-01-29 | Disposition: A | Discharge status: Home / Self Care | Payer: 59 | Attending: Emergency Medicine | Admitting: Emergency Medicine

## 2023-01-29 ENCOUNTER — Encounter (HOSPITAL_BASED_OUTPATIENT_CLINIC_OR_DEPARTMENT_OTHER): Payer: Self-pay | Admitting: Emergency Medicine

## 2023-01-29 ENCOUNTER — Other Ambulatory Visit: Payer: Self-pay

## 2023-01-29 ENCOUNTER — Encounter: Payer: Self-pay | Admitting: Family Medicine

## 2023-01-29 ENCOUNTER — Emergency Department (HOSPITAL_BASED_OUTPATIENT_CLINIC_OR_DEPARTMENT_OTHER): Payer: 59 | Admitting: Radiology

## 2023-01-29 VITALS — BP 210/121 | HR 85 | Temp 98.1°F | Resp 16 | Wt 192.4 lb

## 2023-01-29 DIAGNOSIS — R1084 Generalized abdominal pain: Secondary | ICD-10-CM | POA: Diagnosis not present

## 2023-01-29 DIAGNOSIS — I10 Essential (primary) hypertension: Secondary | ICD-10-CM | POA: Diagnosis present

## 2023-01-29 DIAGNOSIS — E113552 Type 2 diabetes mellitus with stable proliferative diabetic retinopathy, left eye: Secondary | ICD-10-CM

## 2023-01-29 DIAGNOSIS — Z79899 Other long term (current) drug therapy: Secondary | ICD-10-CM | POA: Insufficient documentation

## 2023-01-29 DIAGNOSIS — Z794 Long term (current) use of insulin: Secondary | ICD-10-CM

## 2023-01-29 DIAGNOSIS — I159 Secondary hypertension, unspecified: Secondary | ICD-10-CM | POA: Insufficient documentation

## 2023-01-29 LAB — BASIC METABOLIC PANEL WITH GFR
Anion gap: 7 (ref 5–15)
BUN: 19 mg/dL (ref 6–20)
CO2: 27 mmol/L (ref 22–32)
Calcium: 8.8 mg/dL — ABNORMAL LOW (ref 8.9–10.3)
Chloride: 101 mmol/L (ref 98–111)
Creatinine, Ser: 1.69 mg/dL — ABNORMAL HIGH (ref 0.61–1.24)
GFR, Estimated: 51 mL/min — ABNORMAL LOW (ref >60)
Glucose, Bld: 108 mg/dL — ABNORMAL HIGH (ref 70–99)
Potassium: 4.4 mmol/L (ref 3.5–5.1)
Sodium: 135 mmol/L (ref 135–145)

## 2023-01-29 LAB — POCT GLYCOSYLATED HEMOGLOBIN (HGB A1C): Hemoglobin A1C: 9.5 % — AB (ref 4.0–5.6)

## 2023-01-29 LAB — CBC
HCT: 36.1 % — ABNORMAL LOW (ref 39.0–52.0)
Hemoglobin: 11.9 g/dL — ABNORMAL LOW (ref 13.0–17.0)
MCH: 27.5 pg (ref 26.0–34.0)
MCHC: 33 g/dL (ref 30.0–36.0)
MCV: 83.4 fL (ref 80.0–100.0)
Platelets: 283 10*3/uL (ref 150–400)
RBC: 4.33 MIL/uL (ref 4.22–5.81)
RDW: 12.4 % (ref 11.5–15.5)
WBC: 8.1 10*3/uL (ref 4.0–10.5)
nRBC: 0 % (ref 0.0–0.2)

## 2023-01-29 LAB — TROPONIN I (HIGH SENSITIVITY)
Troponin I (High Sensitivity): 28 ng/L — ABNORMAL HIGH (ref <18)
Troponin I (High Sensitivity): 25 ng/L — ABNORMAL HIGH (ref <18)

## 2023-01-29 MED ORDER — LISINOPRIL 10 MG PO TABS
20.0000 mg | ORAL_TABLET | Freq: Once | ORAL | Status: AC
  Administered 2023-01-29: 20 mg via ORAL
  Filled 2023-01-29: qty 2

## 2023-01-29 MED ORDER — LISINOPRIL 20 MG PO TABS: 20.0000 mg | ORAL_TABLET | Freq: Every day | ORAL | 0 refills | Status: DC

## 2023-01-29 MED ORDER — ACETAMINOPHEN 500 MG PO TABS
1000.0000 mg | ORAL_TABLET | Freq: Once | ORAL | Status: AC
  Administered 2023-01-29: 1000 mg via ORAL
  Filled 2023-01-29: qty 2

## 2023-01-29 MED ORDER — CARVEDILOL 12.5 MG PO TABS
25.0000 mg | ORAL_TABLET | Freq: Once | ORAL | Status: AC
  Administered 2023-01-29: 25 mg via ORAL
  Filled 2023-01-29: qty 2

## 2023-01-29 MED ORDER — OXYCODONE HCL 5 MG PO TABS
10.0000 mg | ORAL_TABLET | Freq: Once | ORAL | Status: AC
  Administered 2023-01-29: 10 mg via ORAL
  Filled 2023-01-29: qty 2

## 2023-01-29 MED ORDER — HYDRALAZINE HCL 25 MG PO TABS
100.0000 mg | ORAL_TABLET | Freq: Once | ORAL | Status: AC
  Administered 2023-01-29: 100 mg via ORAL
  Filled 2023-01-29: qty 4

## 2023-01-29 MED ORDER — PANTOPRAZOLE SODIUM 40 MG PO TBEC
40.0000 mg | DELAYED_RELEASE_TABLET | Freq: Once | ORAL | Status: AC
  Administered 2023-01-29: 40 mg via ORAL
  Filled 2023-01-29: qty 1

## 2023-01-29 MED ORDER — TRIAMTERENE-HCTZ 75-50 MG PO TABS: 1.0000 | ORAL_TABLET | Freq: Every day | ORAL | 0 refills | Status: DC

## 2023-01-29 NOTE — ED Provider Notes (Signed)
[place] EMERGENCY DEPARTMENT AT [facility] Provider Note   CSN: [id number] Arrival date & time: 01/29/23  1752     History Chief Complaint  Patient presents with   Hypertension    HPI Jonahs Heckman. is a 42 y.o. male presenting for multiple complaints. The patient presents with a chief complaint of high blood pressure, which was measured at 240/140 or 135 during a visit to their primary care physician earlier today. The patient was seeing their doctor for cataract surgery evaluation. They report experiencing a headache and stomach pain at the time of the visit. The patient has a history of diabetes and is legally blind.  The patient is currently on amlodipine (Norvasc), Coreg (carvedilol), and possibly Hydralazine for blood pressure management, although they are uncertain about the latter. They mention that their son helps them with medication management. The patient states that they did not take their blood pressure medications today, as their son did not put them out before leaving for school. They report that their blood pressure is usually well-controlled with medication.  The patient also requests pain relief of his chronic abdominal apin, mentioning that they do not usually take anything for pain at home.   Patient's recorded medical, surgical, social, medication list and allergies were reviewed in the Snapshot window as part of the initial history.   Review of Systems   Review of Systems  Constitutional:  Negative for chills and fever.  HENT:  Negative for ear pain and sore throat.   Eyes:  Negative for pain and visual disturbance.  Respiratory:  Negative for cough and shortness of breath.   Cardiovascular:  Negative for chest pain and palpitations.  Gastrointestinal:  Positive for abdominal pain. Negative for vomiting.  Genitourinary:  Negative for dysuria and hematuria.  Musculoskeletal:  Negative for arthralgias and back pain.  Skin:  Negative for color  change and rash.  Neurological:  Positive for headaches. Negative for seizures and syncope.  All other systems reviewed and are negative.   Physical Exam Updated Vital Signs BP (!) 168/103   Pulse 88   Temp 98.7 F (37.1 C) (Oral)   Resp 16   Ht 5\' 8"  (1.727 m)   Wt 87.3 kg   SpO2 99%   BMI 29.26 kg/m  Physical Exam Vitals and nursing note reviewed.  Constitutional:      General: He is not in acute distress.    Appearance: He is well-developed.  HENT:     Head: Normocephalic and atraumatic.  Eyes:     Conjunctiva/sclera: Conjunctivae normal.  Cardiovascular:     Rate and Rhythm: Normal rate and regular rhythm.     Heart sounds: No murmur heard. Pulmonary:     Effort: Pulmonary effort is normal. No respiratory distress.     Breath sounds: Normal breath sounds.  Abdominal:     Palpations: Abdomen is soft.     Tenderness: There is no abdominal tenderness.  Musculoskeletal:        General: No swelling.     Cervical back: Neck supple.  Skin:    General: Skin is warm and dry.     Capillary Refill: Capillary refill takes less than 2 seconds.  Neurological:     Mental Status: He is alert.  Psychiatric:        Mood and Affect: Mood normal.      ED Course/ Medical Decision Making/ A&P    Procedures Procedures   Medications Ordered in ED Medications  hydrALAZINE (APRESOLINE)  tablet 100 mg (100 mg Oral Given 01/29/23 2046)  lisinopril (ZESTRIL) tablet 20 mg (20 mg Oral Given 01/29/23 2046)  carvedilol (COREG) tablet 25 mg (25 mg Oral Given 01/29/23 2046)  pantoprazole (PROTONIX) EC tablet 40 mg (40 mg Oral Given 01/29/23 2047)  acetaminophen (TYLENOL) tablet 1,000 mg (1,000 mg Oral Given 01/29/23 2048)  oxyCODONE (Oxy IR/ROXICODONE) immediate release tablet 10 mg (10 mg Oral Given 01/29/23 2047)   Medical Decision Making:   [name]. is a 42 y.o. male who presented to the ED today with EBP at clinic and High blood pressure readings detailed above.    Patient  placed on continuous vitals and telemetry monitoring while in ED which was reviewed periodically.  Complete initial physical exam performed, notably the patient  was HDS in NAD, hypertensive on arrival.    Reviewed and confirmed nursing documentation for past medical history, family history, social history.    Initial Assessment:   With the patient's presentation of elevated blood pressure readings, most likely diagnosis is hypertensive urgency. Other diagnoses associated with hypertensive emergency were considered including (but not limited to) intracranial hemorrhage, acute renal artery stenosis, acute kidney injury, myocardial stress, ophthalmologic emergencies. These are considered less likely due to history of present illness and physical exam findings.   This is most consistent with an acute life/limb threatening illness complicated by underlying chronic conditions. Will evaluate for hypertensive emergency as below.  Initial Plan:  Screening labs including CBC and Metabolic panel to evaluate for infectious or metabolic etiology of disease.  CXR to evaluate for structural/infectious intrathoracic pathology.  Troponins/EKG to evaluate for cardiac pathology. Objective evaluation as below reviewed.  Will treat with all of his home blood pressure medications and if appropriate responsive anticipate outpatient management Initial Study Results:   Laboratory  All laboratory results reviewed without evidence of clinically relevant pathology.   Indeterminately elevated first troponin sample.  Second sample ordered after administration of antihypertensives.  Anticipate this is a mild demand from his sudden discontinuation of hydralazine in particular EKG EKG was reviewed independently. Rate, rhythm, axis, intervals all examined and without medically relevant abnormality. ST segments without concerns for elevations.    Radiology:  All images reviewed independently. Agree with radiology report at this  time.   DG Chest 2 View  Result Date: 01/29/2023 CLINICAL DATA:  Hypertension EXAM: CHEST - 2 VIEW COMPARISON:  10/20/2022 FINDINGS: The heart size and mediastinal contours are within normal limits. Both lungs are clear. The visualized skeletal structures are unremarkable. IMPRESSION: No active cardiopulmonary disease. Electronically Signed   By: Alcide Clever M.D.   On: 01/29/2023 19:36     Reassessment and Plan:   On reassessment he is comfortably sitting in no acute distress.  He is tolerating p.o. intake feels comfortable discharge.  His blood pressure returned to normal with only administration of his home antihypertensives and pain is grossly improved.  He feels comfortable with ongoing outpatient care and management and is requesting discharge and has already called his ride. Given improvement I believe patient stable for outpatient care management.  Disposition:  I have considered need for hospitalization, however, considering all of the above, I believe this patient is stable for discharge at this time.  Patient/family educated about specific return precautions for given chief complaint and symptoms.  Patient/family educated about follow-up with PCP.     Patient/family expressed understanding of return precautions and need for follow-up. Patient spoken to regarding all imaging and laboratory results and appropriate follow  up for these results. All education provided in verbal form with additional information in written form. Time was allowed for answering of patient questions. Patient discharged.    Emergency Department Medication Summary:   Medications  hydrALAZINE (APRESOLINE) tablet 100 mg (100 mg Oral Given 01/29/23 2046)  lisinopril (ZESTRIL) tablet 20 mg (20 mg Oral Given 01/29/23 2046)  carvedilol (COREG) tablet 25 mg (25 mg Oral Given 01/29/23 2046)  pantoprazole (PROTONIX) EC tablet 40 mg (40 mg Oral Given 01/29/23 2047)  acetaminophen (TYLENOL) tablet 1,000 mg (1,000 mg Oral  Given 01/29/23 2048)  oxyCODONE (Oxy IR/ROXICODONE) immediate release tablet 10 mg (10 mg Oral Given 01/29/23 2047)        Clinical Impression:  1. Generalized abdominal pain   2. Secondary hypertension      Discharge   Final Clinical Impression(s) / ED Diagnoses Final diagnoses:  Generalized abdominal pain  Secondary hypertension    Rx / DC Orders ED Discharge Orders     None         Glyn Ade, MD 01/29/23 2245

## 2023-01-29 NOTE — ED Triage Notes (Signed)
Pt didn't take bp meds today because his son didn't put it out for him. Pt alert & oriented, nad noted.

## 2023-01-29 NOTE — ED Triage Notes (Signed)
Pt arrived via [facility] from home. Per EMS, pt c/o abd pain and headache associated with HTN since this morning. Pt was seen at PCP today, with systolic BP over 161, then went home and called for EMS. Pt caox4, NAD, stating he did not take his BP meds this morning. Pt legally blind.

## 2023-01-29 NOTE — ED Notes (Signed)
Reviewed AVS/discharge instruction with patient. Time allotted for and all questions answered. Patient is agreeable for d/c and escorted to ed exit by staff.  

## 2023-01-29 NOTE — Discharge Instructions (Addendum)
Please take your medication as prescribed

## 2023-01-31 ENCOUNTER — Telehealth: Payer: Self-pay | Admitting: Pharmacist

## 2023-01-31 ENCOUNTER — Ambulatory Visit (INDEPENDENT_AMBULATORY_CARE_PROVIDER_SITE_OTHER): Payer: 59 | Admitting: Family Medicine

## 2023-01-31 VITALS — BP 153/91 | HR 73

## 2023-01-31 DIAGNOSIS — I10 Essential (primary) hypertension: Secondary | ICD-10-CM

## 2023-01-31 NOTE — Progress Notes (Unsigned)
Established Patient Office Visit  Subjective    Patient ID: Randy [name]., male    DOB: Oct 17, 1980  Age: 42 y.o. MRN: [id number]  CC:  Chief Complaint  Patient presents with   Hypertension    BP check    HPI Randy [name]. presents for blood pressure follow up. He was seen in ED and treated for hypertensive emergency. He denies acute complaints.   Outpatient Encounter Medications as of 01/31/2023  Medication Sig   amLODipine (NORVASC) 10 MG tablet Take 1 tablet (10 mg total) by mouth daily.   atorvastatin (LIPITOR) 80 MG tablet Take 1 tablet (80 mg total) by mouth daily.   carvedilol (COREG) 25 MG tablet Take 1 tablet (25 mg total) by mouth 2 (two) times daily with a meal.   Continuous Blood Gluc Receiver (FREESTYLE LIBRE 2 READER) DEVI Use to check blood glucose continuously.   Continuous Blood Gluc Sensor (FREESTYLE LIBRE 2 SENSOR) MISC Use to check blood glucose continuously. Change sensors once every 14 days.   Continuous Glucose Sensor (FREESTYLE LIBRE 3 SENSOR) MISC Place 1 sensor on the skin every 14 days. Use to check glucose continuously   hydrALAZINE (APRESOLINE) 100 MG tablet Take 1 tablet (100 mg total) by mouth every 8 (eight) hours.   insulin aspart (NOVOLOG) 100 UNIT/ML FlexPen Inject 10 Units into the skin 3 (three) times daily with meals.   Insulin Glargine (BASAGLAR KWIKPEN) 100 UNIT/ML Inject 34 Units into the skin daily.   Insulin Pen Needle (TRUEPLUS 5-BEVEL PEN NEEDLES) 32G X 4 MM MISC Use to inject Basaglar once daily and Novolog three times daily. Max: 4 pen needles daily.   Ipratropium-Albuterol (COMBIVENT) 20-100 MCG/ACT AERS respimat Inhale 1 puff into the lungs every 6 (six) hours as needed for wheezing or shortness of breath.   lisinopril (ZESTRIL) 20 MG tablet Take 1 tablet (20 mg total) by mouth daily.   pantoprazole (PROTONIX) 40 MG tablet Take 1 tablet (40 mg total) by mouth 2 (two) times daily.   sildenafil (VIAGRA) 100 MG tablet Take 0.5-1  tablets (50-100 mg total) by mouth daily as needed for erectile dysfunction.   triamterene-hydrochlorothiazide (MAXZIDE) 75-50 MG tablet Take 1 tablet by mouth daily.   No facility-administered encounter medications on file as of 01/31/2023.    Past Medical History:  Diagnosis Date   Essential hypertension 05/31/2019   HLD (hyperlipidemia)    Pancreatitis    Type II diabetes mellitus (HCC)     Past Surgical History:  Procedure Laterality Date   EYE SURGERY     NO PAST SURGERIES      Family History  Problem Relation Age of Onset   Hypertension Mother    Diabetes Father    Hypertension Father    Diabetes Brother    Diabetes Maternal Grandmother    Colon cancer Neg Hx    Esophageal cancer Neg Hx    Rectal cancer Neg Hx    Stomach cancer Neg Hx     Social History   Socioeconomic History   Marital status: Married    Spouse name: Hydrologist Flud   Number of children: 4   Years of education: Not on file   Highest education level: Not on file  Occupational History   Occupation: Drummer for [employer]  Tobacco Use   Smoking status: Former    Current packs/day: 0.00    Average packs/day: 0.3 packs/day for 17.0 years (5.6 ttl pk-yrs)    Types: Cigarettes    Start  Established Patient Office Visit  Subjective    Patient ID: Randy [name]., male    DOB: Oct 17, 1980  Age: 42 y.o. MRN: [id number]  CC:  Chief Complaint  Patient presents with   Hypertension    BP check    HPI Randy [name]. presents for blood pressure follow up. He was seen in ED and treated for hypertensive emergency. He denies acute complaints.   Outpatient Encounter Medications as of 01/31/2023  Medication Sig   amLODipine (NORVASC) 10 MG tablet Take 1 tablet (10 mg total) by mouth daily.   atorvastatin (LIPITOR) 80 MG tablet Take 1 tablet (80 mg total) by mouth daily.   carvedilol (COREG) 25 MG tablet Take 1 tablet (25 mg total) by mouth 2 (two) times daily with a meal.   Continuous Blood Gluc Receiver (FREESTYLE LIBRE 2 READER) DEVI Use to check blood glucose continuously.   Continuous Blood Gluc Sensor (FREESTYLE LIBRE 2 SENSOR) MISC Use to check blood glucose continuously. Change sensors once every 14 days.   Continuous Glucose Sensor (FREESTYLE LIBRE 3 SENSOR) MISC Place 1 sensor on the skin every 14 days. Use to check glucose continuously   hydrALAZINE (APRESOLINE) 100 MG tablet Take 1 tablet (100 mg total) by mouth every 8 (eight) hours.   insulin aspart (NOVOLOG) 100 UNIT/ML FlexPen Inject 10 Units into the skin 3 (three) times daily with meals.   Insulin Glargine (BASAGLAR KWIKPEN) 100 UNIT/ML Inject 34 Units into the skin daily.   Insulin Pen Needle (TRUEPLUS 5-BEVEL PEN NEEDLES) 32G X 4 MM MISC Use to inject Basaglar once daily and Novolog three times daily. Max: 4 pen needles daily.   Ipratropium-Albuterol (COMBIVENT) 20-100 MCG/ACT AERS respimat Inhale 1 puff into the lungs every 6 (six) hours as needed for wheezing or shortness of breath.   lisinopril (ZESTRIL) 20 MG tablet Take 1 tablet (20 mg total) by mouth daily.   pantoprazole (PROTONIX) 40 MG tablet Take 1 tablet (40 mg total) by mouth 2 (two) times daily.   sildenafil (VIAGRA) 100 MG tablet Take 0.5-1  tablets (50-100 mg total) by mouth daily as needed for erectile dysfunction.   triamterene-hydrochlorothiazide (MAXZIDE) 75-50 MG tablet Take 1 tablet by mouth daily.   No facility-administered encounter medications on file as of 01/31/2023.    Past Medical History:  Diagnosis Date   Essential hypertension 05/31/2019   HLD (hyperlipidemia)    Pancreatitis    Type II diabetes mellitus (HCC)     Past Surgical History:  Procedure Laterality Date   EYE SURGERY     NO PAST SURGERIES      Family History  Problem Relation Age of Onset   Hypertension Mother    Diabetes Father    Hypertension Father    Diabetes Brother    Diabetes Maternal Grandmother    Colon cancer Neg Hx    Esophageal cancer Neg Hx    Rectal cancer Neg Hx    Stomach cancer Neg Hx     Social History   Socioeconomic History   Marital status: Married    Spouse name: Hydrologist Flud   Number of children: 4   Years of education: Not on file   Highest education level: Not on file  Occupational History   Occupation: Drummer for church  Tobacco Use   Smoking status: Former    Current packs/day: 0.00    Average packs/day: 0.3 packs/day for 17.0 years (5.6 ttl pk-yrs)    Types: Cigarettes    Start  02/28/2023) for follow up.   Tommie Raymond, MD   Blood Pressure Recheck Visit  Name: Randy [name]. MRN: [id number] Date of Birth: Mar 04, 1981  Randy [name]. presents today for Blood Pressure recheck with clinical support staff.  Order for BP recheck by Dr. Andrey Campanile.   BP Readings from Last 3 Encounters:  01/31/23 (!) 153/91  01/29/23 (!) 168/103  01/29/23 (!) 210/121    Current Outpatient Medications  Medication Sig Dispense Refill   amLODipine (NORVASC) 10 MG tablet Take 1 tablet (10 mg total) by mouth daily. 90 tablet 1   atorvastatin (LIPITOR) 80 MG tablet Take 1 tablet (80 mg total) by mouth daily. 90 tablet 1   carvedilol (COREG) 25 MG tablet Take 1 tablet (25 mg total) by mouth 2 (two) times daily with a meal. 180 tablet 1   Continuous Blood Gluc Receiver (FREESTYLE LIBRE 2 READER) DEVI Use to check blood glucose continuously. 1 each 0   Continuous Blood Gluc Sensor (FREESTYLE LIBRE 2 SENSOR) MISC Use to check blood glucose continuously. Change sensors once every 14 days. 2 each 6   Continuous Glucose Sensor (FREESTYLE LIBRE 3 SENSOR) MISC Place 1 sensor on the skin every 14 days. Use to check glucose continuously 2 each 0   hydrALAZINE (APRESOLINE) 100 MG tablet Take 1 tablet (100 mg total) by mouth every 8 (eight) hours. 270 tablet 1   insulin aspart (NOVOLOG) 100 UNIT/ML FlexPen Inject 10 Units into the skin 3 (three) times daily with meals. 15 mL 2   Insulin Glargine (BASAGLAR KWIKPEN) 100 UNIT/ML Inject 34 Units into the skin daily. 15 mL 3   Insulin Pen  Needle (TRUEPLUS 5-BEVEL PEN NEEDLES) 32G X 4 MM MISC Use to inject Basaglar once daily and Novolog three times daily. Max: 4 pen needles daily. 400 each 2   Ipratropium-Albuterol (COMBIVENT) 20-100 MCG/ACT AERS respimat Inhale 1 puff into the lungs every 6 (six) hours as needed for wheezing or shortness of breath. 4 g 5   lisinopril (ZESTRIL) 20 MG tablet Take 1 tablet (20 mg total) by mouth daily. 90 tablet 0   pantoprazole (PROTONIX) 40 MG tablet Take 1 tablet (40 mg total) by mouth 2 (two) times daily. 180 tablet 1   sildenafil (VIAGRA) 100 MG tablet Take 0.5-1 tablets (50-100 mg total) by mouth daily as needed for erectile dysfunction. 5 tablet 11   triamterene-hydrochlorothiazide (MAXZIDE) 75-50 MG tablet Take 1 tablet by mouth daily. 90 tablet 0   No current facility-administered medications for this visit.    Hypertensive Medication Review: Patient states that they are taking all their hypertensive medications as prescribed and their last dose of hypertensive medications was this morning.  He states that he did not take a dose of medication last night because when he checked his BP it was low (SBP 150). Advised patient of BP goal, 130/80.  Advised patient to consider taking Maxide during the day time due to the diuretic. Patient states he is legally blind. Advised this could help reduce the number of time he has to go to the bathroom during the night and fall risk. Patient verbalized understanding.   Documentation of any medication adherence discrepancies: none

## 2023-01-31 NOTE — Telephone Encounter (Signed)
Can we submit a PA for this patient's Randy [name]? He is on two types of insulin.

## 2023-02-03 ENCOUNTER — Encounter: Payer: Self-pay | Admitting: Family Medicine
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
## 2023-02-04 ENCOUNTER — Other Ambulatory Visit: Payer: Self-pay

## 2023-02-04 ENCOUNTER — Encounter: Payer: Self-pay | Admitting: Family Medicine

## 2023-02-06 ENCOUNTER — Telehealth: Payer: Self-pay

## 2023-02-06 ENCOUNTER — Other Ambulatory Visit: Payer: Self-pay

## 2023-02-06 NOTE — Telephone Encounter (Signed)
Pharmacy Patient Advocate Encounter   Received notification from CoverMyMeds that prior authorization for FREESTYLE LIBRE 3 SENSORS is required/requested.   Insurance verification completed.   The patient is insured through [facility] .   Per test claim: PA required; PA submitted to [facility] via CoverMyMeds Key/confirmation #/EOC [id number] Status is pending

## 2023-03-04 ENCOUNTER — Ambulatory Visit (INDEPENDENT_AMBULATORY_CARE_PROVIDER_SITE_OTHER): Payer: 59 | Admitting: Family Medicine

## 2023-03-04 ENCOUNTER — Encounter: Payer: Self-pay | Admitting: Family Medicine

## 2023-03-04 VITALS — BP 157/105 | HR 92 | Temp 98.7°F | Resp 16 | Ht 67.0 in | Wt 186.4 lb

## 2023-03-04 DIAGNOSIS — I10 Essential (primary) hypertension: Secondary | ICD-10-CM | POA: Diagnosis not present

## 2023-03-04 MED ORDER — HYDRALAZINE HCL 100 MG PO TABS
100.0000 mg | ORAL_TABLET | Freq: Two times a day (BID) | ORAL | 1 refills | Status: DC
Start: 2023-03-04 — End: 2023-09-04
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
## 2023-03-05 ENCOUNTER — Encounter: Payer: Self-pay | Admitting: Family Medicine

## 2023-03-24 ENCOUNTER — Other Ambulatory Visit: Payer: Self-pay

## 2023-03-24 ENCOUNTER — Other Ambulatory Visit: Payer: Self-pay | Admitting: Family Medicine

## 2023-03-27 ENCOUNTER — Other Ambulatory Visit: Payer: Self-pay

## 2023-04-02 ENCOUNTER — Other Ambulatory Visit: Payer: Self-pay

## 2023-04-04 ENCOUNTER — Encounter: Payer: Self-pay | Admitting: Family Medicine

## 2023-04-04 ENCOUNTER — Ambulatory Visit (INDEPENDENT_AMBULATORY_CARE_PROVIDER_SITE_OTHER): Payer: 59 | Admitting: Family Medicine

## 2023-04-04 ENCOUNTER — Other Ambulatory Visit: Payer: Self-pay

## 2023-04-04 VITALS — BP 160/92 | HR 71 | Temp 98.5°F | Resp 18 | Ht 68.0 in | Wt 197.2 lb

## 2023-04-04 DIAGNOSIS — I10 Essential (primary) hypertension: Secondary | ICD-10-CM

## 2023-04-04 DIAGNOSIS — H269 Unspecified cataract: Secondary | ICD-10-CM

## 2023-04-04 MED ORDER — BLOOD GLUCOSE MONITOR KIT
PACK | 0 refills | Status: DC
  Filled 2023-04-04: qty 1, 30d supply, fill #0

## 2023-04-04 MED ORDER — ACCU-CHEK SOFTCLIX LANCETS MISC
0 refills | Status: DC
  Filled 2023-04-04: qty 100, 25d supply, fill #0

## 2023-04-04 MED ORDER — TRUE METRIX BLOOD GLUCOSE TEST VI STRP
ORAL_STRIP | 0 refills | Status: AC
Start: 2023-04-04 — End: ?
  Filled 2023-04-04 – 2023-09-01 (×2): qty 100, 25d supply, fill #0

## 2023-04-04 MED ORDER — BLOOD GLUCOSE TEST STRIPS 333 VI STRP: ORAL_STRIP | 1 refills | Status: DC

## 2023-04-08 ENCOUNTER — Other Ambulatory Visit: Payer: Self-pay

## 2023-04-08 ENCOUNTER — Encounter: Payer: Self-pay | Admitting: Family Medicine
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
## 2023-04-15 ENCOUNTER — Other Ambulatory Visit: Payer: Self-pay

## 2023-04-24 ENCOUNTER — Other Ambulatory Visit: Payer: Self-pay

## 2023-04-30 IMAGING — DX DG TIBIA/FIBULA 2V*L*
2 series · 2 of 2 positions shown · non-contrast
Comparison: None.

CLINICAL DATA: Fall, hit mid shin.  Rule out fracture.

EXAM:
LEFT TIBIA AND FIBULA - 2 VIEW

[tibia ap]
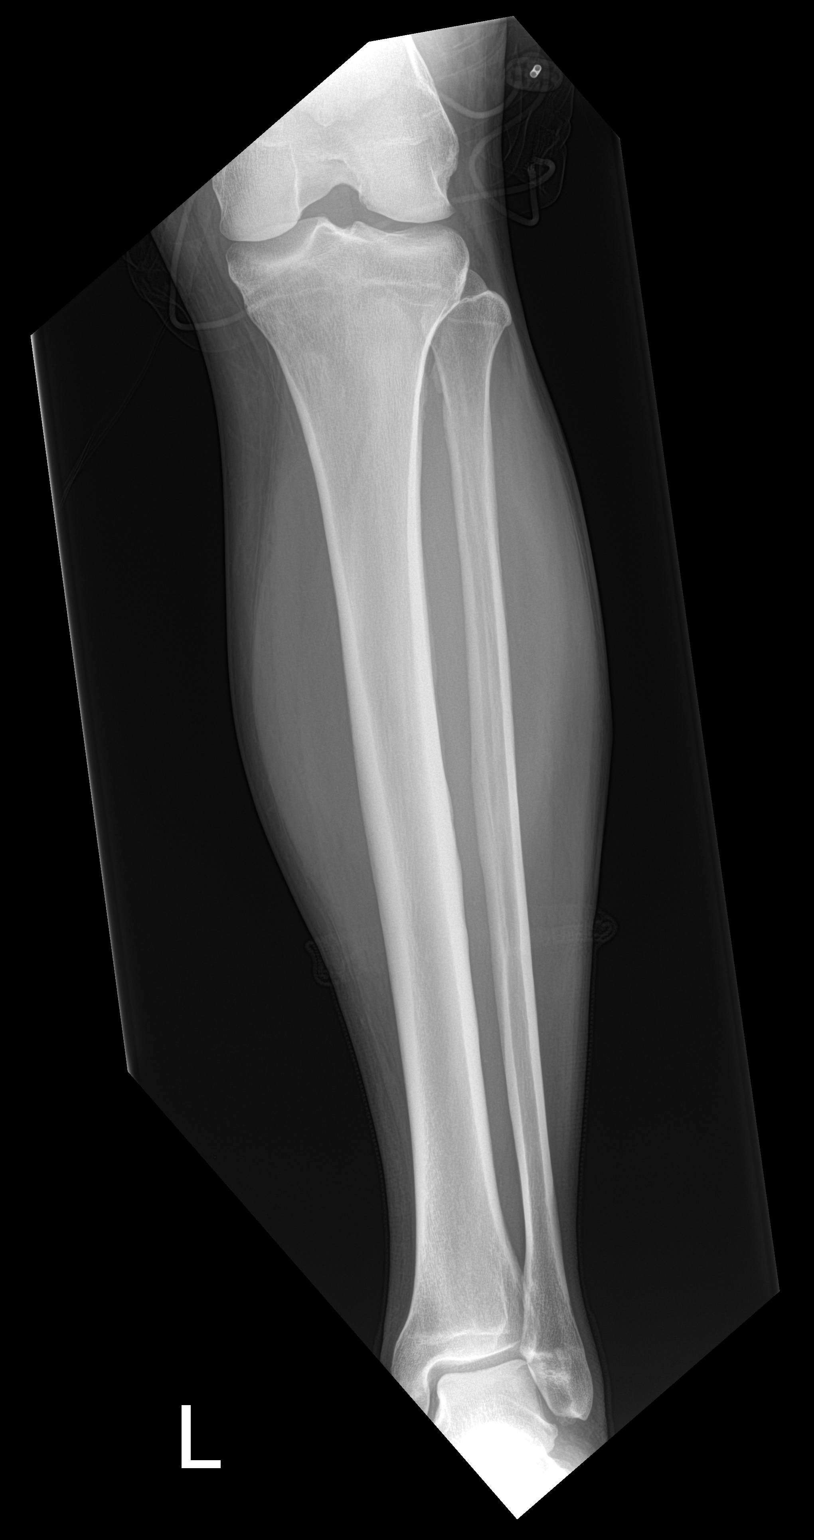

[tibia lat]
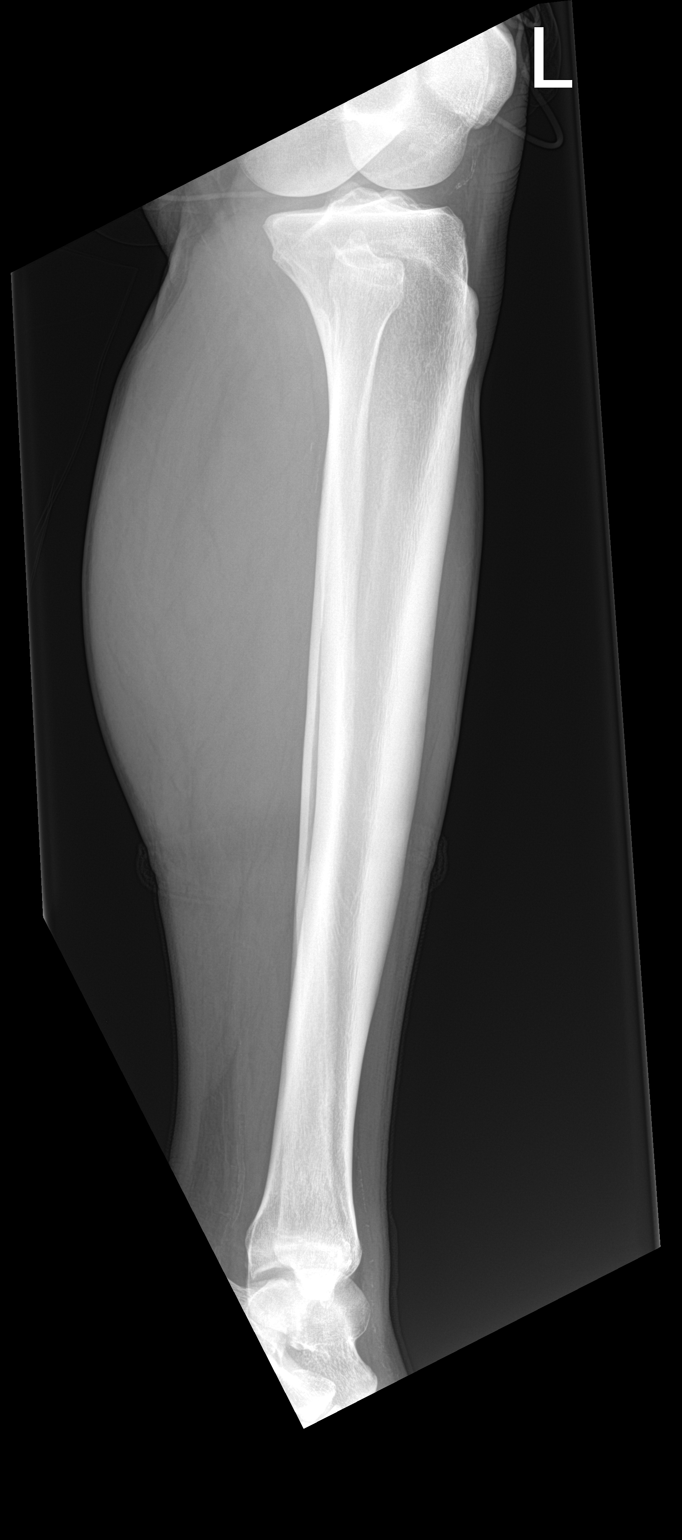

[2 of 2 positions shown; findings below may reference images not displayed]

FINDINGS: There is no evidence of fracture or other focal bone lesions.
Vascular calcifications are present in the soft tissues.
IMPRESSION: No acute fracture or dislocation.

## 2023-04-30 IMAGING — CT CT ABD-PELV W/ CM
2 of 5 series · 16 of 46 positions shown, 18 images · IV contrast (agent unspecified)
Comparison: 04/02/2021

CLINICAL DATA: Left lower quadrant abdominal pain, trauma

EXAM:
CT ABDOMEN AND PELVIS WITH CONTRAST
TECHNIQUE: Multidetector CT imaging of the abdomen and pelvis was performed
using the standard protocol following bolus administration of
intravenous contrast.

[Series 3: abdomen 5.0 · axial · 0.96mm/px · z∈[+780,+1180]mm · 13 of 92 slices shown, 15 images]
[im 6/92  soft-tissue]
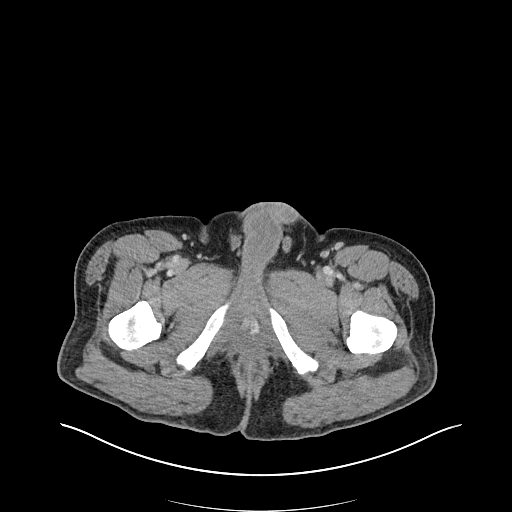
[im 6/92  bone]
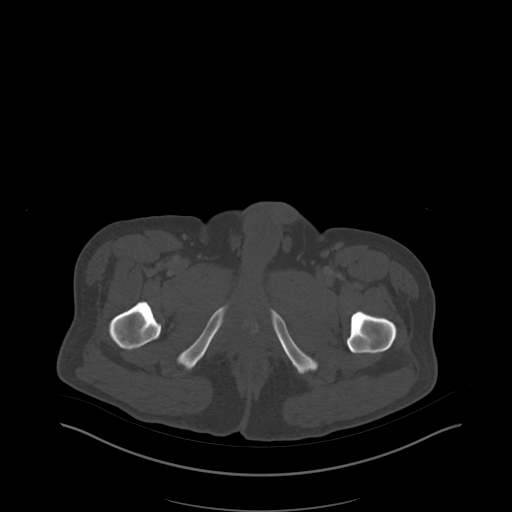
[im 12/92  soft-tissue]
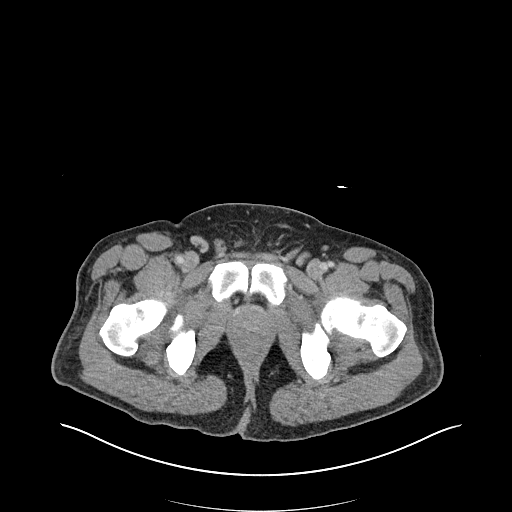
[im 18/92  soft-tissue]
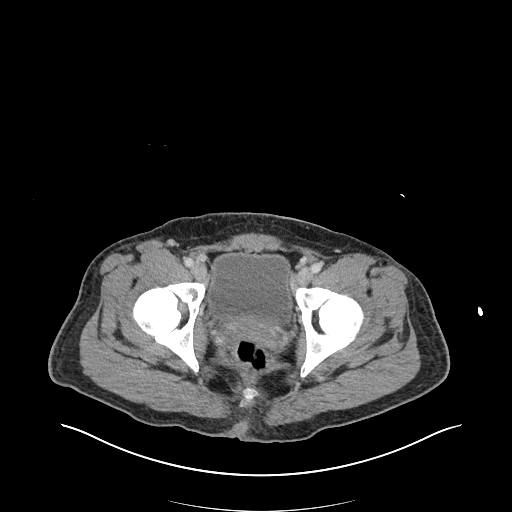
[im 29/92  soft-tissue]
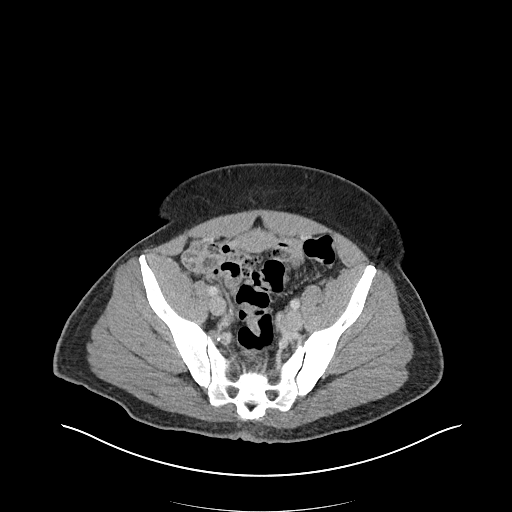
[im 35/92  soft-tissue]
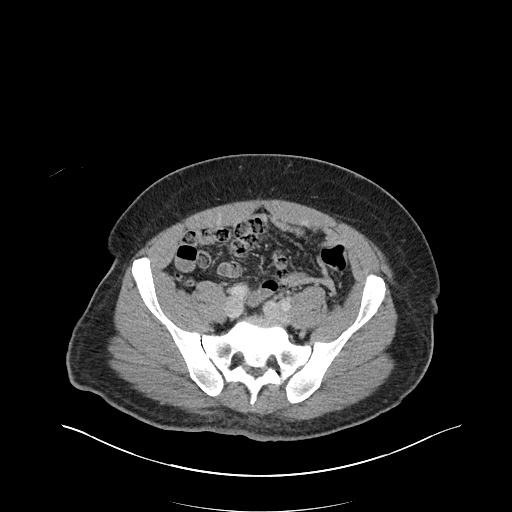
[im 40/92  soft-tissue]
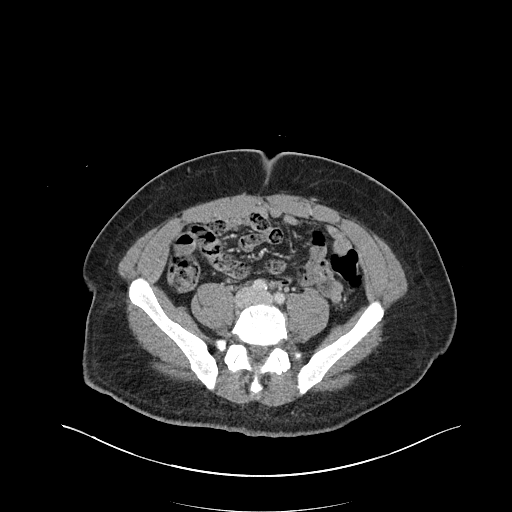
[im 46/92  soft-tissue]
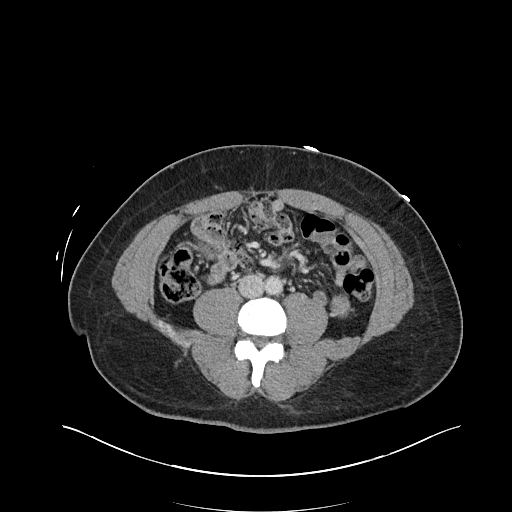
[im 52/92  soft-tissue]
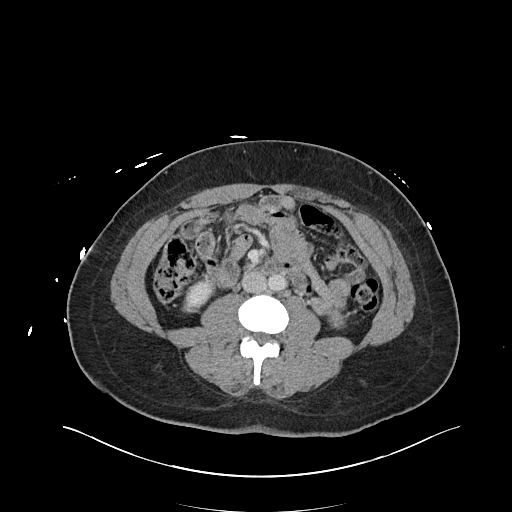
[im 57/92  soft-tissue]
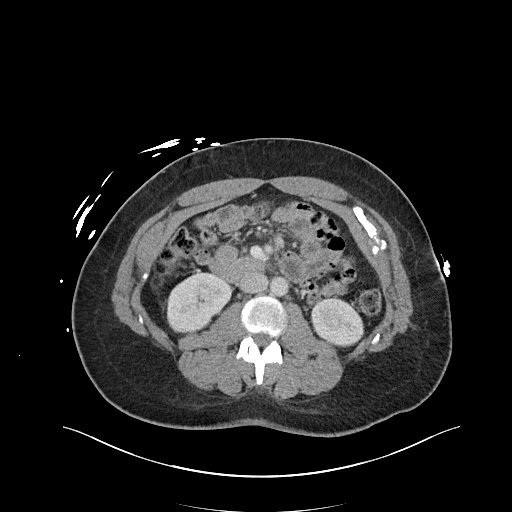
[im 57/92  bone]
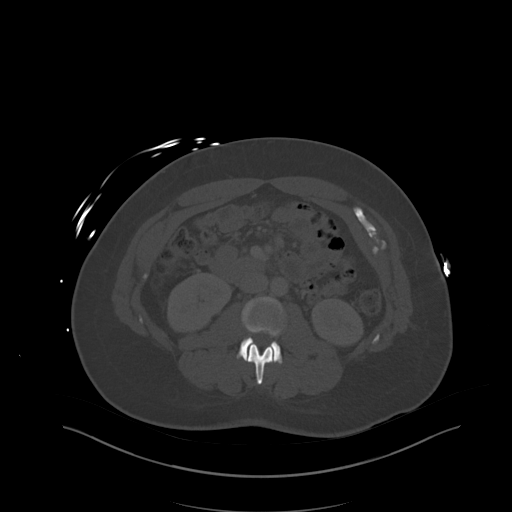
[im 63/92  soft-tissue]
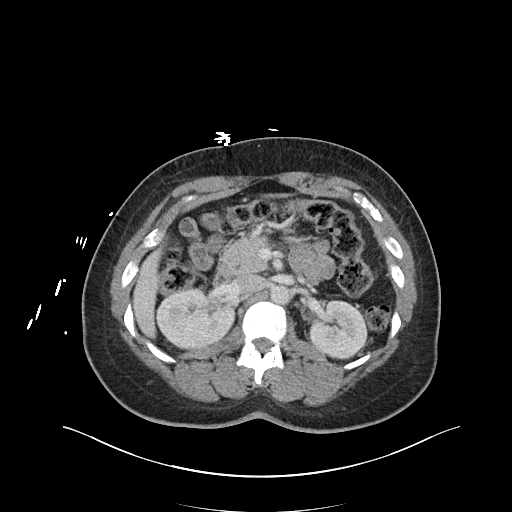
[im 74/92  soft-tissue]
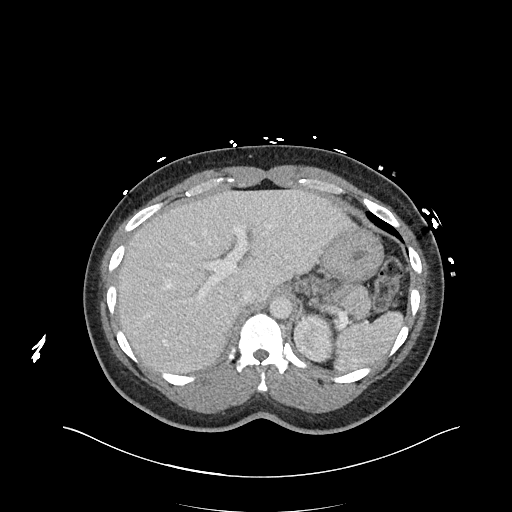
[im 80/92  soft-tissue]
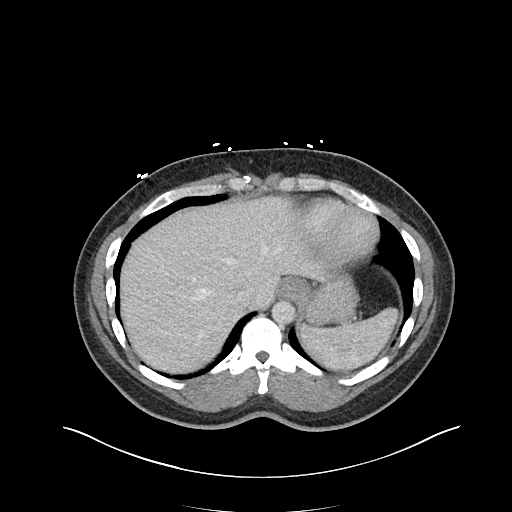
[im 86/92  soft-tissue]
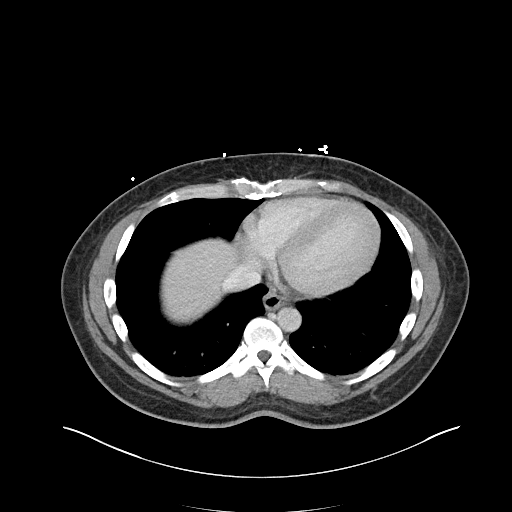

[Series 6: abdomen 3.0 mpr cor · coronal · 0.79mm/px · 3 of 98 slices shown]
[im 33/98  soft-tissue]
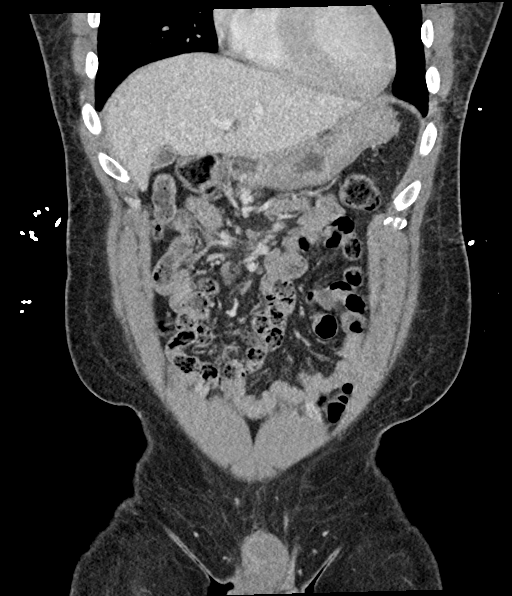
[im 44/98  soft-tissue]
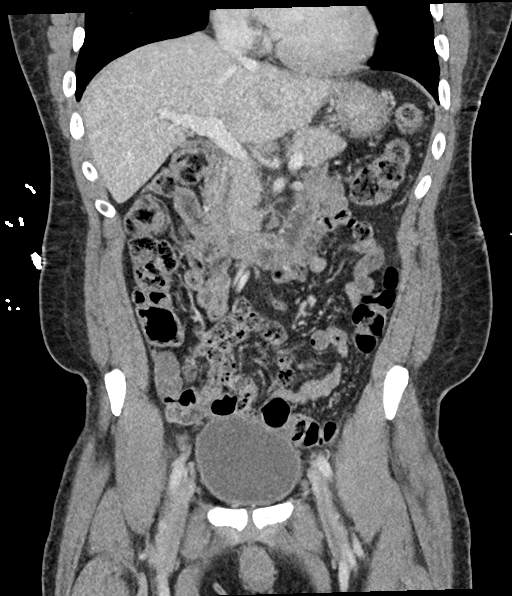
[im 54/98  soft-tissue]
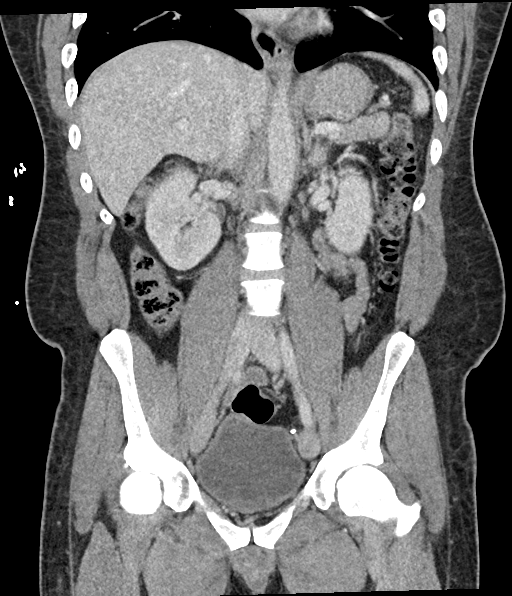

[16 of 46 positions shown; findings below may reference images not displayed]

RADIATION DOSE REDUCTION: This exam was performed according to the
departmental dose-optimization program which includes automated
exposure control, adjustment of the mA and/or kV according to
patient size and/or use of iterative reconstruction technique.

CONTRAST:  100mL OMNIPAQUE IOHEXOL 300 MG/ML  SOLN
FINDINGS: Lower chest: No acute abnormality.

Hepatobiliary: No focal liver abnormality is seen. No gallstones,
gallbladder wall thickening, or biliary dilatation.

Pancreas: Unremarkable. No pancreatic ductal dilatation or
surrounding inflammatory changes.

Spleen: Normal in size without focal abnormality.

Adrenals/Urinary Tract: Adrenal glands are unremarkable. Kidneys are
normal, without renal calculi, focal lesion, or hydronephrosis.
Bladder is unremarkable.

Stomach/Bowel: Stomach is within normal limits. Appendix appears
normal. No evidence of bowel wall thickening, distention, or
inflammatory changes.

Vascular/Lymphatic: No significant vascular findings are present. No
enlarged abdominal or pelvic lymph nodes.

Reproductive: Prostate is unremarkable.

Other: No free fluid or free air in the abdomen or pelvis. No
significant soft tissue injury.

Musculoskeletal: No acute osseous abnormality.
IMPRESSION: No acute process in the abdomen or pelvis.

## 2023-04-30 IMAGING — DX DG CHEST 2V
2 series · 2 of 2 positions shown · non-contrast
Comparison: 04/08/2020.

CLINICAL DATA: Fall, concern for left-sided rib fracture.

EXAM:
CHEST - 2 VIEW

[chest lat]
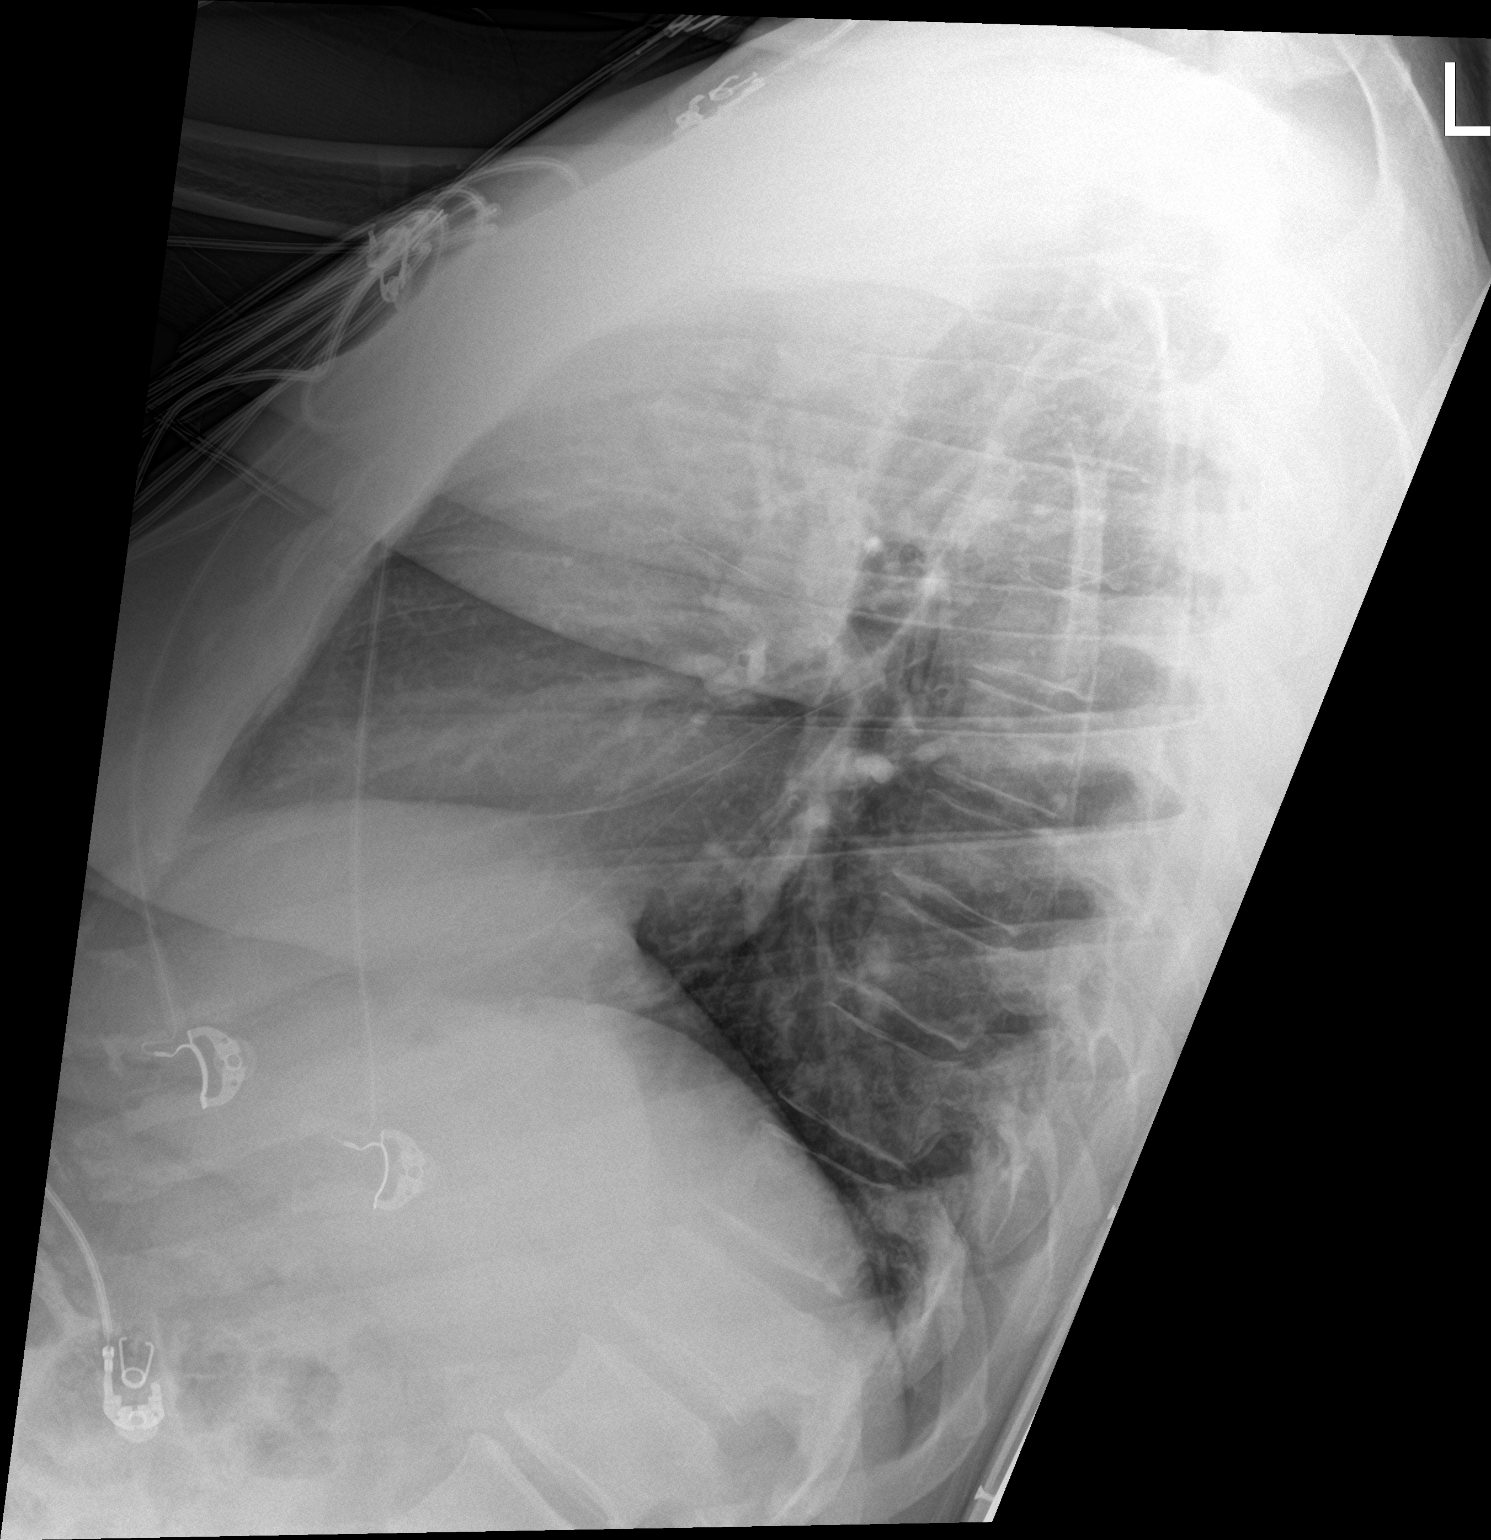

[chest ap]
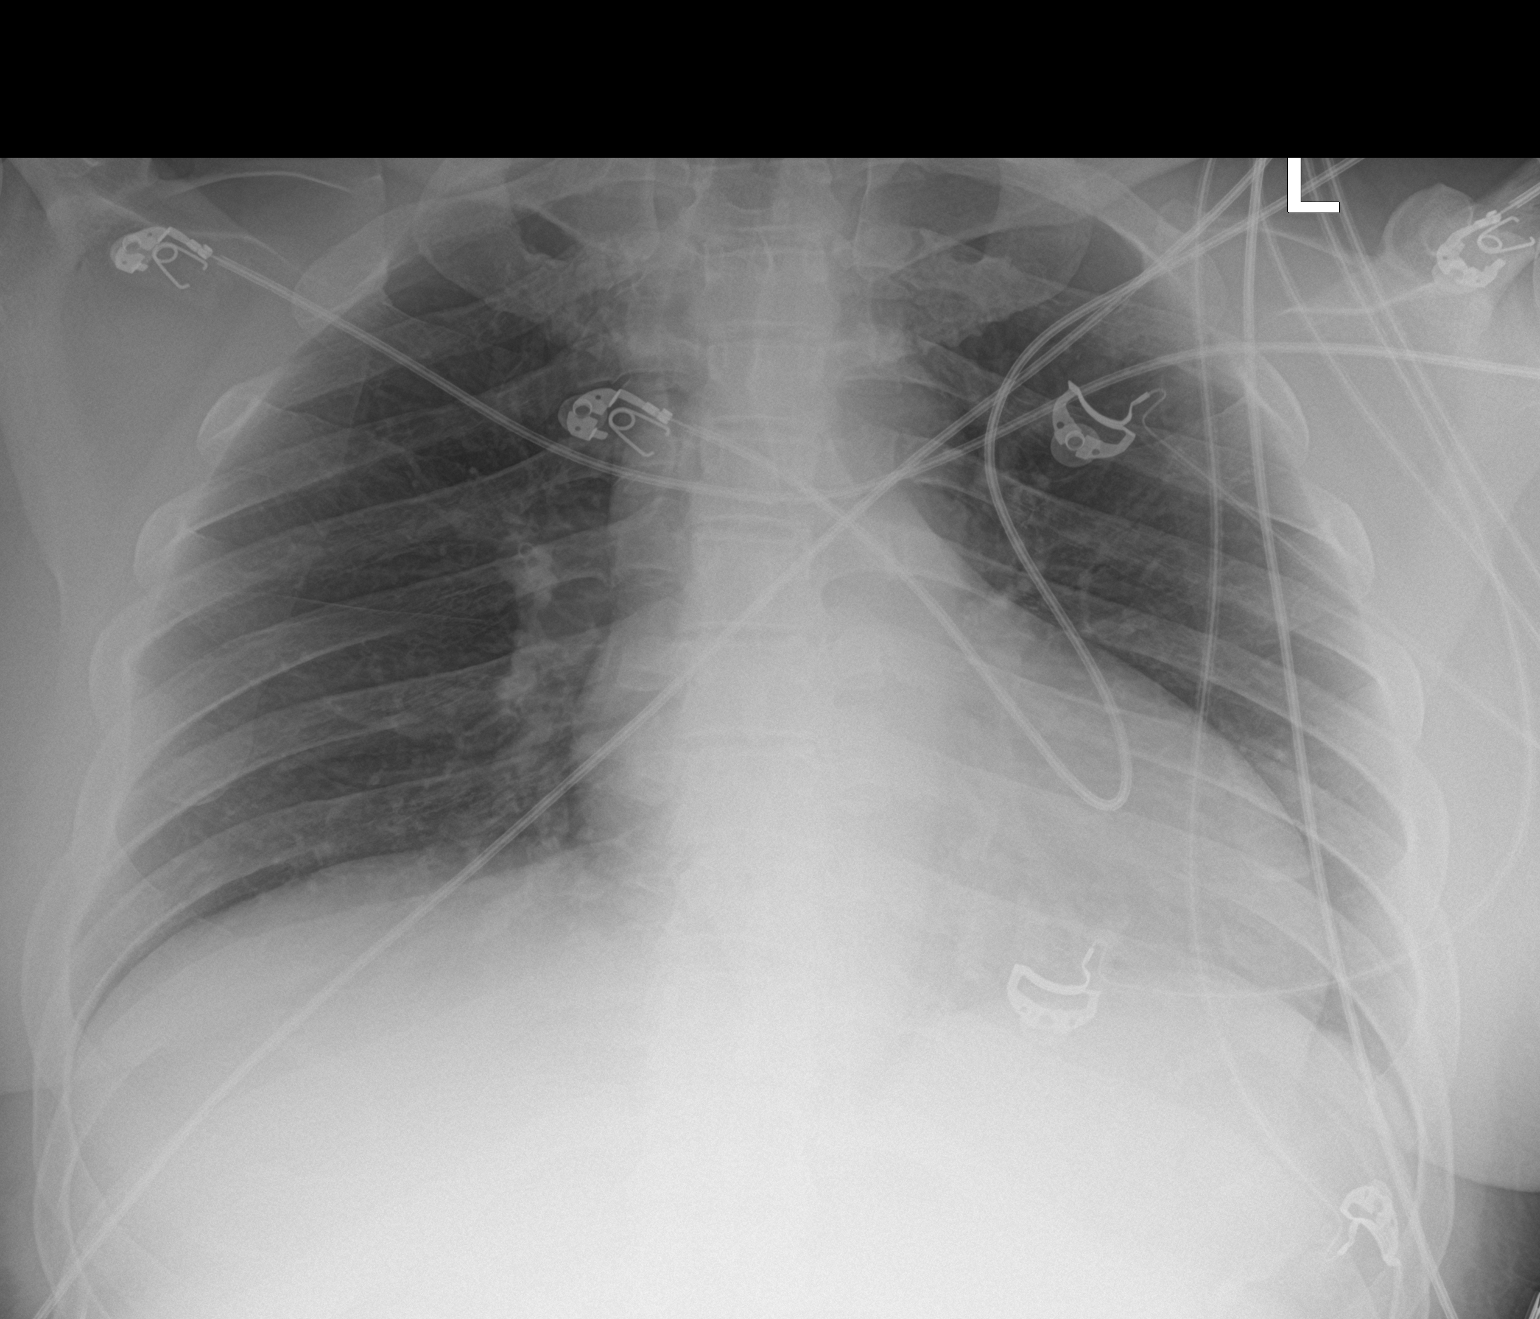

[2 of 2 positions shown; findings below may reference images not displayed]

FINDINGS: The heart size and mediastinal contours are within normal limits.
Lung volumes are low. No consolidation, effusion, or pneumothorax.
No obvious rib fracture or other acute osseous abnormality.
IMPRESSION: No active cardiopulmonary disease.

## 2023-05-02 ENCOUNTER — Ambulatory Visit: Payer: 59 | Admitting: Family Medicine

## 2023-05-19 ENCOUNTER — Other Ambulatory Visit: Payer: Self-pay | Admitting: Family Medicine

## 2023-05-20 ENCOUNTER — Inpatient Hospital Stay (HOSPITAL_COMMUNITY)
Admission: EM | Admit: 2023-05-20 | Discharge: 2023-05-24 | DRG: 683 | Disposition: A | Discharge status: Home / Self Care | Payer: 59 | Attending: Internal Medicine | Admitting: Internal Medicine

## 2023-05-20 ENCOUNTER — Other Ambulatory Visit: Payer: Self-pay

## 2023-05-20 ENCOUNTER — Encounter (HOSPITAL_COMMUNITY): Payer: Self-pay

## 2023-05-20 DIAGNOSIS — E871 Hypo-osmolality and hyponatremia: Secondary | ICD-10-CM | POA: Diagnosis present

## 2023-05-20 DIAGNOSIS — R112 Nausea with vomiting, unspecified: Secondary | ICD-10-CM | POA: Diagnosis present

## 2023-05-20 DIAGNOSIS — K760 Fatty (change of) liver, not elsewhere classified: Secondary | ICD-10-CM | POA: Diagnosis present

## 2023-05-20 DIAGNOSIS — Z91018 Allergy to other foods: Secondary | ICD-10-CM

## 2023-05-20 DIAGNOSIS — Z79899 Other long term (current) drug therapy: Secondary | ICD-10-CM

## 2023-05-20 DIAGNOSIS — D631 Anemia in chronic kidney disease: Secondary | ICD-10-CM | POA: Diagnosis present

## 2023-05-20 DIAGNOSIS — K219 Gastro-esophageal reflux disease without esophagitis: Secondary | ICD-10-CM | POA: Diagnosis present

## 2023-05-20 DIAGNOSIS — E1165 Type 2 diabetes mellitus with hyperglycemia: Secondary | ICD-10-CM | POA: Diagnosis present

## 2023-05-20 DIAGNOSIS — N179 Acute kidney failure, unspecified: Principal | ICD-10-CM | POA: Diagnosis present

## 2023-05-20 DIAGNOSIS — Z794 Long term (current) use of insulin: Secondary | ICD-10-CM

## 2023-05-20 DIAGNOSIS — R1110 Vomiting, unspecified: Secondary | ICD-10-CM

## 2023-05-20 DIAGNOSIS — Z833 Family history of diabetes mellitus: Secondary | ICD-10-CM

## 2023-05-20 DIAGNOSIS — Z8249 Family history of ischemic heart disease and other diseases of the circulatory system: Secondary | ICD-10-CM

## 2023-05-20 DIAGNOSIS — N1831 Chronic kidney disease, stage 3a: Secondary | ICD-10-CM | POA: Diagnosis present

## 2023-05-20 DIAGNOSIS — I129 Hypertensive chronic kidney disease with stage 1 through stage 4 chronic kidney disease, or unspecified chronic kidney disease: Secondary | ICD-10-CM | POA: Diagnosis present

## 2023-05-20 DIAGNOSIS — E1122 Type 2 diabetes mellitus with diabetic chronic kidney disease: Secondary | ICD-10-CM | POA: Diagnosis present

## 2023-05-20 DIAGNOSIS — Z87891 Personal history of nicotine dependence: Secondary | ICD-10-CM

## 2023-05-20 DIAGNOSIS — E785 Hyperlipidemia, unspecified: Secondary | ICD-10-CM | POA: Diagnosis present

## 2023-05-20 DIAGNOSIS — E860 Dehydration: Secondary | ICD-10-CM | POA: Diagnosis present

## 2023-05-20 DIAGNOSIS — E11649 Type 2 diabetes mellitus with hypoglycemia without coma: Secondary | ICD-10-CM | POA: Diagnosis not present

## 2023-05-20 DIAGNOSIS — Z713 Dietary counseling and surveillance: Secondary | ICD-10-CM

## 2023-05-20 LAB — CBC WITH DIFFERENTIAL/PLATELET
Abs Immature Granulocytes: 0.03 10*3/uL (ref 0.00–0.07)
Basophils Absolute: 0.1 10*3/uL (ref 0.0–0.1)
Basophils Relative: 2 %
Eosinophils Absolute: 0.1 10*3/uL (ref 0.0–0.5)
Eosinophils Relative: 1 %
HCT: 34.9 % — ABNORMAL LOW (ref 39.0–52.0)
Hemoglobin: 11.7 g/dL — ABNORMAL LOW (ref 13.0–17.0)
Immature Granulocytes: 0 %
Lymphocytes Relative: 25 %
Lymphs Abs: 1.8 10*3/uL (ref 0.7–4.0)
MCH: 28.1 pg (ref 26.0–34.0)
MCHC: 33.5 g/dL (ref 30.0–36.0)
MCV: 83.9 fL (ref 80.0–100.0)
Monocytes Absolute: 0.5 10*3/uL (ref 0.1–1.0)
Monocytes Relative: 6 %
Neutro Abs: 4.8 10*3/uL (ref 1.7–7.7)
Neutrophils Relative %: 66 %
Platelets: 251 10*3/uL (ref 150–400)
RBC: 4.16 MIL/uL — ABNORMAL LOW (ref 4.22–5.81)
RDW: 12.1 % (ref 11.5–15.5)
WBC: 7.3 10*3/uL (ref 4.0–10.5)
nRBC: 0 % (ref 0.0–0.2)

## 2023-05-20 LAB — COMPREHENSIVE METABOLIC PANEL
ALT: 16 U/L (ref 0–44)
AST: 25 U/L (ref 15–41)
Albumin: 4.1 g/dL (ref 3.5–5.0)
Alkaline Phosphatase: 91 U/L (ref 38–126)
Anion gap: 11 (ref 5–15)
BUN: 34 mg/dL — ABNORMAL HIGH (ref 6–20)
CO2: 22 mmol/L (ref 22–32)
Calcium: 9.1 mg/dL (ref 8.9–10.3)
Chloride: 99 mmol/L (ref 98–111)
Creatinine, Ser: 2.25 mg/dL — ABNORMAL HIGH (ref 0.61–1.24)
GFR, Estimated: 36 mL/min — ABNORMAL LOW (ref >60)
Glucose, Bld: 247 mg/dL — ABNORMAL HIGH (ref 70–99)
Potassium: 5 mmol/L (ref 3.5–5.1)
Sodium: 132 mmol/L — ABNORMAL LOW (ref 135–145)
Total Bilirubin: 1 mg/dL (ref 0.0–1.2)
Total Protein: 8 g/dL (ref 6.5–8.1)

## 2023-05-20 LAB — LIPASE, BLOOD: Lipase: 73 U/L — ABNORMAL HIGH (ref 11–51)

## 2023-05-20 MED ORDER — ONDANSETRON 4 MG PO TBDP
4.0000 mg | ORAL_TABLET | Freq: Once | ORAL | Status: AC
  Administered 2023-05-20: 4 mg via ORAL
  Filled 2023-05-20: qty 1

## 2023-05-20 NOTE — ED Triage Notes (Signed)
 Pt BIB [facility] for vomiting and lower abdominal pain x 3 hours. Pt has history of pancreatitis and the pain feels similar. 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

## 2023-05-20 NOTE — ED Provider Triage Note (Signed)
 Emergency Medicine Provider Triage Evaluation Note  Randy [name]. , a 43 y.o. male  was evaluated in triage.  Pt complains of abdominal pain that began 3 hours ago.  Patient has history of pancreatitis states this feels similar.  Patient states he has not been eating fatty or large foods and denies marijuana smoking or alcohol.  Patient denies fevers or chest pain.  Review of Systems  Positive:  Negative:   Physical Exam  BP 120/86 (BP Location: Left Arm)   Pulse 77   Temp 98.3 F (36.8 C) (Oral)   Resp 18   SpO2 98%  Gen:   Awake, no distress   Resp:  Normal effort  MSK:   Moves extremities without difficulty  Other:  Mild epigastric tenderness, no peritoneal signs  Medical Decision Making  Medically screening exam initiated at 8:18 PM.  Appropriate orders placed.  Randy R [place] [employer]. was informed that the remainder of the evaluation will be completed by another provider, this initial triage assessment does not replace that evaluation, and the importance of remaining in the ED until their evaluation is complete.  Workup initiated, patient stable at this time   Randy [name] 05/20/23 2019

## 2023-05-21 ENCOUNTER — Emergency Department (HOSPITAL_COMMUNITY): Payer: 59

## 2023-05-21 DIAGNOSIS — N179 Acute kidney failure, unspecified: Secondary | ICD-10-CM

## 2023-05-21 LAB — BASIC METABOLIC PANEL
Anion gap: 8 (ref 5–15)
BUN: 41 mg/dL — ABNORMAL HIGH (ref 6–20)
CO2: 22 mmol/L (ref 22–32)
Calcium: 8.6 mg/dL — ABNORMAL LOW (ref 8.9–10.3)
Chloride: 100 mmol/L (ref 98–111)
Creatinine, Ser: 3 mg/dL — ABNORMAL HIGH (ref 0.61–1.24)
GFR, Estimated: 26 mL/min — ABNORMAL LOW (ref >60)
Glucose, Bld: 267 mg/dL — ABNORMAL HIGH (ref 70–99)
Potassium: 5 mmol/L (ref 3.5–5.1)
Sodium: 130 mmol/L — ABNORMAL LOW (ref 135–145)

## 2023-05-21 LAB — GLUCOSE, CAPILLARY
Glucose-Capillary: 249 mg/dL — ABNORMAL HIGH (ref 70–99)
Glucose-Capillary: 165 mg/dL — ABNORMAL HIGH (ref 70–99)
Glucose-Capillary: 29 mg/dL — CL (ref 70–99)
Glucose-Capillary: 140 mg/dL — ABNORMAL HIGH (ref 70–99)

## 2023-05-21 LAB — URINALYSIS, ROUTINE W REFLEX MICROSCOPIC
Bacteria, UA: NONE SEEN
Bilirubin Urine: NEGATIVE
Glucose, UA: 150 mg/dL — AB
Hgb urine dipstick: NEGATIVE
Ketones, ur: NEGATIVE mg/dL
Nitrite: NEGATIVE
Protein, ur: 100 mg/dL — AB
Specific Gravity, Urine: 1.025 (ref 1.005–1.030)
pH: 5 (ref 5.0–8.0)

## 2023-05-21 LAB — TRIGLYCERIDES: Triglycerides: 257 mg/dL — ABNORMAL HIGH (ref <150)

## 2023-05-21 LAB — HIV ANTIBODY (ROUTINE TESTING W REFLEX): HIV Screen 4th Generation wRfx: NONREACTIVE

## 2023-05-21 LAB — CBG MONITORING, ED: Glucose-Capillary: 289 mg/dL — ABNORMAL HIGH (ref 70–99)

## 2023-05-21 MED ORDER — ONDANSETRON HCL 4 MG PO TABS: 4.0000 mg | ORAL_TABLET | Freq: Four times a day (QID) | ORAL | Status: DC | PRN

## 2023-05-21 MED ORDER — INSULIN GLARGINE-YFGN 100 UNIT/ML ~~LOC~~ SOLN
15.0000 [IU] | Freq: Every day | SUBCUTANEOUS | Status: DC
  Administered 2023-05-21 – 2023-05-24 (×4): 15 [IU] via SUBCUTANEOUS
  Filled 2023-05-21 (×4): qty 0.15

## 2023-05-21 MED ORDER — PANTOPRAZOLE SODIUM 40 MG PO TBEC
40.0000 mg | DELAYED_RELEASE_TABLET | Freq: Two times a day (BID) | ORAL | Status: DC
  Administered 2023-05-21 – 2023-05-24 (×7): 40 mg via ORAL
  Filled 2023-05-21 (×7): qty 1

## 2023-05-21 MED ORDER — LACTATED RINGERS IV BOLUS
1000.0000 mL | Freq: Once | INTRAVENOUS | Status: AC
  Administered 2023-05-21: 1000 mL via INTRAVENOUS

## 2023-05-21 MED ORDER — HYDROMORPHONE HCL 1 MG/ML IJ SOLN
0.5000 mg | Freq: Once | INTRAMUSCULAR | Status: AC
  Administered 2023-05-21: 0.5 mg via INTRAVENOUS
  Filled 2023-05-21: qty 1

## 2023-05-21 MED ORDER — ATORVASTATIN CALCIUM 20 MG PO TABS
80.0000 mg | ORAL_TABLET | Freq: Every day | ORAL | Status: DC
  Administered 2023-05-21 – 2023-05-24 (×4): 80 mg via ORAL
  Filled 2023-05-21 (×3): qty 4
  Filled 2023-05-21: qty 2

## 2023-05-21 MED ORDER — OXYCODONE HCL 5 MG PO TABS
10.0000 mg | ORAL_TABLET | ORAL | Status: DC | PRN
  Administered 2023-05-22: 10 mg via ORAL
  Filled 2023-05-21: qty 2

## 2023-05-21 MED ORDER — DIPHENHYDRAMINE HCL 50 MG/ML IJ SOLN
12.5000 mg | Freq: Once | INTRAMUSCULAR | Status: AC
  Administered 2023-05-21: 12.5 mg via INTRAVENOUS
  Filled 2023-05-21: qty 1

## 2023-05-21 MED ORDER — ACETAMINOPHEN 325 MG PO TABS: 650.0000 mg | ORAL_TABLET | Freq: Four times a day (QID) | ORAL | Status: DC | PRN

## 2023-05-21 MED ORDER — DIPHENHYDRAMINE HCL 50 MG/ML IJ SOLN
25.0000 mg | Freq: Four times a day (QID) | INTRAMUSCULAR | Status: AC | PRN
  Administered 2023-05-21 – 2023-05-22 (×2): 25 mg via INTRAVENOUS
  Filled 2023-05-21 (×2): qty 1

## 2023-05-21 MED ORDER — HYDROMORPHONE HCL 1 MG/ML IJ SOLN
1.0000 mg | INTRAMUSCULAR | Status: DC | PRN
  Administered 2023-05-21 – 2023-05-23 (×7): 1 mg via INTRAVENOUS
  Filled 2023-05-21 (×7): qty 1

## 2023-05-21 MED ORDER — ALBUTEROL SULFATE (2.5 MG/3ML) 0.083% IN NEBU: 2.5000 mg | INHALATION_SOLUTION | RESPIRATORY_TRACT | Status: DC | PRN

## 2023-05-21 MED ORDER — IOHEXOL 300 MG/ML  SOLN
75.0000 mL | Freq: Once | INTRAMUSCULAR | Status: AC | PRN
  Administered 2023-05-21: 75 mL via INTRAVENOUS

## 2023-05-21 MED ORDER — ACETAMINOPHEN 650 MG RE SUPP: 650.0000 mg | Freq: Four times a day (QID) | RECTAL | Status: DC | PRN

## 2023-05-21 MED ORDER — ONDANSETRON HCL 4 MG/2ML IJ SOLN
4.0000 mg | Freq: Once | INTRAMUSCULAR | Status: AC
  Administered 2023-05-21: 4 mg via INTRAVENOUS
  Filled 2023-05-21: qty 2

## 2023-05-21 MED ORDER — KETOROLAC TROMETHAMINE 15 MG/ML IJ SOLN
15.0000 mg | Freq: Once | INTRAMUSCULAR | Status: AC
  Administered 2023-05-21: 15 mg via INTRAVENOUS
  Filled 2023-05-21: qty 1

## 2023-05-21 MED ORDER — LACTATED RINGERS IV SOLN: INTRAVENOUS | Status: AC

## 2023-05-21 MED ORDER — INSULIN ASPART 100 UNIT/ML IJ SOLN
0.0000 [IU] | Freq: Three times a day (TID) | INTRAMUSCULAR | Status: DC
  Administered 2023-05-21: 5 [IU] via SUBCUTANEOUS
  Administered 2023-05-21: 3 [IU] via SUBCUTANEOUS
  Filled 2023-05-21: qty 0.15

## 2023-05-21 MED ORDER — CARVEDILOL 25 MG PO TABS
25.0000 mg | ORAL_TABLET | Freq: Two times a day (BID) | ORAL | Status: DC
  Administered 2023-05-21 – 2023-05-24 (×7): 25 mg via ORAL
  Filled 2023-05-21: qty 1
  Filled 2023-05-21: qty 2
  Filled 2023-05-21 (×5): qty 1

## 2023-05-21 MED ORDER — AMLODIPINE BESYLATE 10 MG PO TABS
10.0000 mg | ORAL_TABLET | Freq: Every day | ORAL | Status: DC
  Administered 2023-05-21 – 2023-05-24 (×4): 10 mg via ORAL
  Filled 2023-05-21 (×2): qty 1
  Filled 2023-05-21: qty 2
  Filled 2023-05-21: qty 1

## 2023-05-21 MED ORDER — HEPARIN SODIUM (PORCINE) 5000 UNIT/ML IJ SOLN
5000.0000 [IU] | Freq: Three times a day (TID) | INTRAMUSCULAR | Status: DC
  Administered 2023-05-21 – 2023-05-24 (×9): 5000 [IU] via SUBCUTANEOUS
  Filled 2023-05-21 (×9): qty 1

## 2023-05-21 MED ORDER — DEXTROSE 50 % IV SOLN
25.0000 g | INTRAVENOUS | Status: AC
  Administered 2023-05-21: 25 g via INTRAVENOUS
  Filled 2023-05-21: qty 50

## 2023-05-21 MED ORDER — INSULIN ASPART 100 UNIT/ML IJ SOLN
0.0000 [IU] | Freq: Every day | INTRAMUSCULAR | Status: DC
Start: 2023-05-21 — End: 2023-05-22
  Filled 2023-05-21: qty 0.05

## 2023-05-21 MED ORDER — ONDANSETRON HCL 4 MG/2ML IJ SOLN
4.0000 mg | Freq: Four times a day (QID) | INTRAMUSCULAR | Status: DC | PRN
  Administered 2023-05-21 – 2023-05-22 (×2): 4 mg via INTRAVENOUS
  Filled 2023-05-21 (×2): qty 2

## 2023-05-21 NOTE — ED Notes (Signed)
 Pt reports "itchy feeling" post Toradol administration. Benadryl given to combat

## 2023-05-21 NOTE — ED Provider Notes (Signed)
 [facility]PT [facility] Emergency Department Provider Note MRN:  [id number]  Arrival date & time: 05/21/23     Chief Complaint   Abdominal Pain   History of Present Illness   Randy [name]. is a 43 y.o. year-old male presents to the ED with chief complaint of abdominal pain and nausea and vomiting.  Onset 7 PM this evening.  He states that this feels similar to prior pancreatitis.  He states that he doesn't drink any ETOH.  He denies diarrhea.  Denies fever.  Still has his gallbladder.  States that he also has some pain down low in the abdomen. Denies any urinary symptoms.    History provided by patient.   Review of Systems  Pertinent positive and negative review of systems noted in HPI.    Physical Exam   Vitals:   05/20/23 2333 05/21/23 0400  BP: 108/70 126/86  Pulse: 76 72  Resp: 20   Temp: 98 F (36.7 C)   SpO2: 100% 100%    CONSTITUTIONAL:  non toxic-appearing, NAD NEURO:  Alert and oriented x 3, CN 3-12 grossly intact EYES:  eyes equal and reactive ENT/NECK:  Supple, no stridor  CARDIO:  normal rate, regular rhythm, appears well-perfused  PULM:  No respiratory distress, CTAB GI/GU:  non-distended, generalized abdominal discomfort MSK/SPINE:  No gross deformities, no edema, moves all extremities  SKIN:  no rash, atraumatic   *Additional and/or pertinent findings included in MDM below  Diagnostic and Interventional Summary    EKG Interpretation Date/Time:    Ventricular Rate:    PR Interval:    QRS Duration:    QT Interval:    QTC Calculation:   R Axis:      Text Interpretation:         Labs Reviewed  CBC WITH DIFFERENTIAL/PLATELET - Abnormal; Notable for the following components:      Result Value   RBC 4.16 (*)    Hemoglobin 11.7 (*)    HCT 34.9 (*)    All other components within normal limits  COMPREHENSIVE METABOLIC PANEL - Abnormal; Notable for the following components:   Sodium 132 (*)    Glucose, Bld 247 (*)    BUN 34  (*)    Creatinine, Ser 2.25 (*)    GFR, Estimated 36 (*)    All other components within normal limits  LIPASE, BLOOD - Abnormal; Notable for the following components:   Lipase 73 (*)    All other components within normal limits  URINALYSIS, ROUTINE W REFLEX MICROSCOPIC    CT ABDOMEN PELVIS W CONTRAST    (Results Pending)    Medications  HYDROmorphone  (DILAUDID ) injection 0.5 mg (has no administration in time range)  ondansetron  (ZOFRAN ) injection 4 mg (has no administration in time range)  lactated ringers  bolus 1,000 mL (has no administration in time range)  ondansetron  (ZOFRAN -ODT) disintegrating tablet 4 mg (4 mg Oral Given 05/20/23 2025)     Procedures  /  Critical Care Procedures  ED Course and Medical Decision Making  I have reviewed the triage vital signs, the nursing notes, and pertinent available records from the EMR.  Social Determinants Affecting Complexity of Care: Patient has no clinically significant social determinants affecting this chief complaint..   ED Course:    Medical Decision Making Patient here with epigastric abdominal pain and vomiting.  States that this feels like his typical pancreatitis.  He isn't a drinker.  He does have a gallbladder.    Onset of symptoms was  last night.  He is improved with pain medications.   Lipase is a little elevated.  CT ordered to assess for signs of pancreatitis.  He's not had any vomiting since being in the treatment room.  He does seem to have an AKI.  If his CT is reassuring and feeling better, consider repeating BMP after fluids.  Amount and/or Complexity of Data Reviewed Radiology: ordered.  Risk Prescription drug management.         Consultants: na   Treatment and Plan: Patient signed out at time of shift change pending CT.  If consistent with pancreatitis, consider admission.  If negative, consider repeating BMP after completion of fluids.  Might be appropriate for outpatient follow-up if CT is negative  and BMP improves and tolerating PO.    Final Clinical Impressions(s) / ED Diagnoses  No diagnosis found.  ED Discharge Orders     None         Discharge Instructions Discussed with and Provided to Patient:   Discharge Instructions   None      Vicky Charleston, PA-C 05/21/23 [id number]    Bari Charmaine FALCON, MD 05/22/23 [phone]

## 2023-05-21 NOTE — H&P (Signed)
 History and Physical  Randy R Road[place] Jr. FMW:[id number] DOB: 1980-10-09 DOA: 05/20/2023  PCP: Tanda Bleacher, MD   Chief Complaint: Abdominal pain, vomiting  HPI: Randy JONELLE Harlen Mickey. is a 43 y.o. male with medical history significant for hypertension, hyperlipidemia, insulin -dependent type 2 diabetes, CKD stage III and suspected cannabinoid hyperemesis being admitted to the hospital with abdominal pain, vomiting, and acute kidney injury.  He states he was in his usual state of health until about 7 PM last night, when he suddenly started vomiting, after which she developed generalized achy abdominal pain, with radiation into his back.  Feels reminiscent of when he was in the hospital with mild pancreatitis June 2024.  States he is intermittently compliant with his blood pressure medication, but a couple of days ago started taking the mall again like he is supposed to.  He thinks this possibly made him sick.  States that he continues to smoke marijuana occasionally, but smokes less than he used to previously.  The last time he was able to keep anything down by mouth was yesterday, in the emergency department he was given pain and nausea medication, some IV fluids.  He has only urinated once, since arriving to the emergency department about 14 hours ago.  Overall since arrival to the emergency department, his nausea is much better, but he still has abdominal pain.  Review of Systems: Please see HPI for pertinent positives and negatives. A complete 10 system review of systems are otherwise negative.  Past Medical History:  Diagnosis Date   Essential hypertension 05/31/2019   HLD (hyperlipidemia)    Pancreatitis    Type II diabetes mellitus (HCC)    Past Surgical History:  Procedure Laterality Date   EYE SURGERY     NO PAST SURGERIES     Social History:  reports that he quit smoking about 11 years ago. His smoking use included cigarettes. He started smoking about 28 years ago. He has a 5.6  pack-year smoking history. He has been exposed to tobacco smoke. He has never used smokeless tobacco. He reports that he does not currently use drugs after having used the following drugs: Marijuana. He reports that he does not drink alcohol.  Allergies  Allergen Reactions   Tomato Itching    Family History  Problem Relation Age of Onset   Hypertension Mother    Diabetes Father    Hypertension Father    Diabetes Brother    Diabetes Maternal Grandmother    Colon cancer Neg Hx    Esophageal cancer Neg Hx    Rectal cancer Neg Hx    Stomach cancer Neg Hx      Prior to Admission medications   Medication Sig Start Date End Date Taking? Authorizing Provider  amLODipine  (NORVASC ) 10 MG tablet Take 1 tablet (10 mg total) by mouth daily. 11/21/22  Yes Tanda Bleacher, MD  atorvastatin  (LIPITOR ) 80 MG tablet Take 1 tablet (80 mg total) by mouth daily. 11/21/22  Yes Tanda Bleacher, MD  carvedilol  (COREG ) 25 MG tablet Take 1 tablet (25 mg total) by mouth 2 (two) times daily with a meal. 11/21/22  Yes Tanda Bleacher, MD  hydrALAZINE  (APRESOLINE ) 100 MG tablet Take 1 tablet (100 mg total) by mouth 2 (two) times daily. 03/04/23  Yes Tanda Bleacher, MD  insulin  aspart (NOVOLOG ) 100 UNIT/ML FlexPen Inject 10 Units into the skin 3 (three) times daily with meals. Patient taking differently: Inject 0-10 Units into the skin 3 (three) times daily with meals. 10/25/22  Yes  Danford, Lonni SQUIBB, MD  Insulin  Glargine (BASAGLAR  KWIKPEN) 100 UNIT/ML Inject 34 Units into the skin daily. Patient taking differently: Inject 30 Units into the skin at bedtime as needed (high BS). 10/25/22  Yes Danford, Lonni SQUIBB, MD  lisinopril  (ZESTRIL ) 20 MG tablet Take 1 tablet by mouth once daily 05/19/23  Yes Tanda Bleacher, MD  pantoprazole  (PROTONIX ) 40 MG tablet Take 1 tablet (40 mg total) by mouth 2 (two) times daily. Patient taking differently: Take 40 mg by mouth daily as needed (heartburn). 11/21/22  Yes Tanda Bleacher, MD   triamterene -hydrochlorothiazide  (MAXZIDE) 75-50 MG tablet Take 1 tablet by mouth once daily 05/19/23  Yes Tanda Bleacher, MD  blood glucose meter kit and supplies KIT Dispense based on patient and insurance preference. Use up to four times daily as directed. 04/04/23   Tanda Bleacher, MD  Continuous Glucose Sensor (FREESTYLE LIBRE 2 SENSOR) MISC Use to check blood glucose continuously. Change sensors once every 14 days. 07/18/22   Newlin, Enobong, MD  Glucose Blood (BLOOD GLUCOSE TEST STRIPS 333) STRP Utilize to check blood sugar TID 04/04/23   Tanda Bleacher, MD  glucose blood (TRUE METRIX BLOOD GLUCOSE TEST) test strip USE UP TO FOUR TIMES DAILY AS DIRECTED 04/04/23   Tanda Bleacher, MD  Insulin  Pen Needle (TRUEPLUS 5-BEVEL PEN NEEDLES) 32G X 4 MM MISC Use to inject Basaglar  once daily and Novolog  three times daily. Max: 4 pen needles daily. 11/12/22   Newlin, Enobong, MD  sildenafil  (VIAGRA ) 100 MG tablet Take 0.5-1 tablets (50-100 mg total) by mouth daily as needed for erectile dysfunction. Patient not taking: Reported on 05/21/2023 11/21/22   Tanda Bleacher, MD  Accu-Chek Softclix Lancets lancets USE UP TO 4 TIMES DAILY 04/04/23   Tanda Bleacher, MD    Physical Exam: BP (!) 147/73   Pulse (!) 114   Temp 97.7 F (36.5 C) (Oral)   Resp 18   SpO2 98%  General:  Alert, oriented, calm, in no acute distress, sleeping when I entered the room, his son is at the bedside Eyes: EOMI, clear conjuctivae, white sclerea Neck: supple, no masses, trachea mildline  Cardiovascular: RRR, no murmurs or rubs, no peripheral edema  Respiratory: clear to auscultation bilaterally, no wheezes, no crackles  Abdomen: soft, tender, no guarding, nondistended, normal bowel tones heard  Skin: dry, no rashes  Musculoskeletal: no joint effusions, normal range of motion  Psychiatric: appropriate affect, normal speech  Neurologic: extraocular muscles intact, clear speech, moving all extremities with intact sensorium          Labs on Admission:  Basic Metabolic Panel: Recent Labs  Lab 05/20/23 2034 05/21/23 0745  NA 132* 130*  K 5.0 5.0  CL 99 100  CO2 22 22  GLUCOSE 247* 267*  BUN 34* 41*  CREATININE 2.25* 3.00*  CALCIUM  9.1 8.6*   Liver Function Tests: Recent Labs  Lab 05/20/23 2034  AST 25  ALT 16  ALKPHOS 91  BILITOT 1.0  PROT 8.0  ALBUMIN 4.1   Recent Labs  Lab 05/20/23 2034  LIPASE 73*   No results for input(s): AMMONIA in the last 168 hours. CBC: Recent Labs  Lab 05/20/23 2034  WBC 7.3  NEUTROABS 4.8  HGB 11.7*  HCT 34.9*  MCV 83.9  PLT 251   Cardiac Enzymes: No results for input(s): CKTOTAL, CKMB, CKMBINDEX, TROPONINI in the last 168 hours. BNP (last 3 results) No results for input(s): BNP in the last 8760 hours.  ProBNP (last 3 results) No results for input(s):  PROBNP in the last 8760 hours.  CBG: No results for input(s): GLUCAP in the last 168 hours.  Radiological Exams on Admission: CT ABDOMEN PELVIS W CONTRAST Result Date: 05/21/2023 CLINICAL DATA:  Lower abdominal pain with nausea vomiting. Pancreatitis. EXAM: CT ABDOMEN AND PELVIS WITH CONTRAST TECHNIQUE: Multidetector CT imaging of the abdomen and pelvis was performed using the standard protocol following bolus administration of intravenous contrast. RADIATION DOSE REDUCTION: This exam was performed according to the departmental dose-optimization program which includes automated exposure control, adjustment of the mA and/or kV according to patient size and/or use of iterative reconstruction technique. CONTRAST:  75mL OMNIPAQUE  IOHEXOL  300 MG/ML  SOLN COMPARISON:  10/20/2022 FINDINGS: Lower chest: Unremarkable. Hepatobiliary: No suspicious focal abnormality within the liver parenchyma. There is no evidence for gallstones, gallbladder wall thickening, or pericholecystic fluid. No intrahepatic or extrahepatic biliary dilation. Pancreas: No focal mass lesion. No dilatation of the main duct. No  intraparenchymal cyst. No peripancreatic edema. Spleen: No splenomegaly. No suspicious focal mass lesion. Adrenals/Urinary Tract: No adrenal nodule or mass. Kidneys unremarkable. No evidence for hydroureter. The urinary bladder appears normal for the degree of distention. Stomach/Bowel: Stomach is unremarkable. No gastric wall thickening. No evidence of outlet obstruction. Duodenum is normally positioned as is the ligament of Treitz. No small bowel wall thickening. No small bowel dilatation. The terminal ileum is normal. The appendix is normal. No gross colonic mass. No colonic wall thickening. Vascular/Lymphatic: There is mild atherosclerotic calcification of the abdominal aorta without aneurysm. There is no gastrohepatic or hepatoduodenal ligament lymphadenopathy. No retroperitoneal or mesenteric lymphadenopathy. No pelvic sidewall lymphadenopathy. Reproductive: The prostate gland and seminal vesicles are unremarkable. Other: No intraperitoneal free fluid. Musculoskeletal: No worrisome lytic or sclerotic osseous abnormality. IMPRESSION: 1. No acute findings in the abdomen or pelvis. Specifically, no findings to explain the patient's history of lower abdominal pain with nausea and vomiting. No appreciable peripancreatic edema or inflammation to suggest pancreatitis. 2.  Aortic Atherosclerosis (ICD10-I70.0). Electronically Signed   By: Camellia Candle M.D.   On: 05/21/2023 06:55   Assessment/Plan  Randy R Fazekas Mickey. is a 43 y.o. male with medical history significant for hypertension, hyperlipidemia, insulin -dependent type 2 diabetes, CKD stage III and suspected cannabinoid hyperemesis being admitted to the hospital with abdominal pain, vomiting, and acute kidney injury.   Vomiting and abdominal pain-given no CT findings of pancreatitis, I suspect his presentation is related to cannabinoid hyperemesis.  He had initial symptoms of emesis, followed by abdominal cramping.  Admits to continued use of cannabis.  On  prior admission in June 2024, he also had minimal lipase elevation, and some associated CT changes but GI felt he had mild pancreatitis due to vomiting, initially caused by cannabinoid hyperemesis.  Today he has no CT changes, no other acute findings in the abdomen.  Lipase is only minimally elevated likely due to vomiting. -Observation admission -Clear liquid diet, advance as tolerated -Continue IV fluids -Pain and nausea medication as needed  Acute kidney injury superimposed on CKD stage III-baseline creatinine approximately 1.6, patient has had only minimal urine output despite 2 L LR bolus, CT scan with no renal obstruction, stone, hydronephrosis, etc.  I suspect he is still significantly dehydrated, and renal function was also affected by nephrotoxic antihypertensive medication. -Hold lisinopril , HCTZ and other nephrotoxins -LR infusion for a total of another 2 L -Monitor renal function with daily labs, consider nephrology consultation if not improving despite adequate hydration and urine output  Insulin -dependent type 2 diabetes-not well-controlled, hemoglobin A1c 9.16 January 2023. -Carb modified diet when advanced -Continue reduced dose home basal insulin  -Moderate dose sliding scale  Hypertension-continue home dose amlodipine  and Coreg , will also resume hydralazine  later today if blood pressure remains elevated/stable.  Hyperlipidemia-Lipitor   GERD-p.o. Protonix   DVT prophylaxis: Subcutaneous heparin  due to AKI    Code Status: Full Code  Consults called: None  Admission status: Observation  Time spent: 49 minutes  Hanae Waiters CHRISTELLA Gail MD [facility] Pager [phone]  If 7PM-7AM, please contact night-coverage [email or web address] Password [facility]  05/21/2023, 10:54 AM

## 2023-05-21 NOTE — ED Provider Notes (Signed)
  Physical Exam  BP 138/88   Pulse 80   Temp 98.1 F (36.7 C) (Oral)   Resp 18   SpO2 95%   Physical Exam  Procedures  .Critical Care  Performed by: Lang Norleen POUR, PA-C Authorized by: Lang Norleen POUR, PA-C   Critical care provider statement:    Critical care time (minutes):  30   Critical care was necessary to treat or prevent imminent or life-threatening deterioration of the following conditions:  Renal failure   Critical care was time spent personally by me on the following activities:  Development of treatment plan with patient or surrogate, discussions with consultants, evaluation of patient's response to treatment, examination of patient, ordering and review of laboratory studies, ordering and review of radiographic studies, ordering and performing treatments and interventions, pulse oximetry, re-evaluation of patient's condition and review of old charts   ED Course / MDM   Clinical Course as of 05/21/23 0931  Wed May 21, 2023  0631 R/o pancreatitis. Also has AKI. If ct is negative, repeat bmp. If improved can dc. If not admit.  [JR]    Clinical Course User Index [JR] Lang Norleen POUR, PA-C   Medical Decision Making Amount and/or Complexity of Data Reviewed Labs: ordered. Radiology: ordered.  Risk Prescription drug management. Decision regarding hospitalization.   On reassessment, BMP revealed worsening renal function after 2 L of fluid.  He stated overall his abdominal pain had improved but still persistent.  Treated with Toradol .  He endorsed generalized itchiness after Toradol .  Treated with 25 mg of IV Benadryl .  Admitted to hospital service for AKI with Dr. Zella.       Lang Norleen POUR, PA-C 05/21/23 9041    Charlyn Sora, MD 05/21/23 1348

## 2023-05-21 NOTE — Progress Notes (Signed)
 After patient experienced episode of vomiting of tan, clearish emesis, patient's CBG was checked.    Hypoglycemic Event @ 2025  CBG: 29 (Taken at 2025)  Treatment: D50 50 mL (25 gm)  Symptoms: Sweaty and Nervous/irritable  Follow-up CBG: Time:2100 CBG Result:140  Possible Reasons for Event: Vomiting and Inadequate meal intake  Comments/MD notified: A. Andrez, NP notified of event.    Randy [name]

## 2023-05-22 DIAGNOSIS — K219 Gastro-esophageal reflux disease without esophagitis: Secondary | ICD-10-CM | POA: Diagnosis present

## 2023-05-22 DIAGNOSIS — Z8249 Family history of ischemic heart disease and other diseases of the circulatory system: Secondary | ICD-10-CM | POA: Diagnosis not present

## 2023-05-22 DIAGNOSIS — Z91018 Allergy to other foods: Secondary | ICD-10-CM | POA: Diagnosis not present

## 2023-05-22 DIAGNOSIS — R112 Nausea with vomiting, unspecified: Secondary | ICD-10-CM | POA: Diagnosis present

## 2023-05-22 DIAGNOSIS — Z794 Long term (current) use of insulin: Secondary | ICD-10-CM | POA: Diagnosis not present

## 2023-05-22 DIAGNOSIS — K760 Fatty (change of) liver, not elsewhere classified: Secondary | ICD-10-CM | POA: Diagnosis present

## 2023-05-22 DIAGNOSIS — Z833 Family history of diabetes mellitus: Secondary | ICD-10-CM | POA: Diagnosis not present

## 2023-05-22 DIAGNOSIS — Z79899 Other long term (current) drug therapy: Secondary | ICD-10-CM | POA: Diagnosis not present

## 2023-05-22 DIAGNOSIS — N179 Acute kidney failure, unspecified: Secondary | ICD-10-CM | POA: Diagnosis present

## 2023-05-22 DIAGNOSIS — N1831 Chronic kidney disease, stage 3a: Secondary | ICD-10-CM | POA: Diagnosis present

## 2023-05-22 DIAGNOSIS — E860 Dehydration: Secondary | ICD-10-CM | POA: Diagnosis present

## 2023-05-22 DIAGNOSIS — E1122 Type 2 diabetes mellitus with diabetic chronic kidney disease: Secondary | ICD-10-CM | POA: Diagnosis present

## 2023-05-22 DIAGNOSIS — E11649 Type 2 diabetes mellitus with hypoglycemia without coma: Secondary | ICD-10-CM | POA: Diagnosis not present

## 2023-05-22 DIAGNOSIS — I129 Hypertensive chronic kidney disease with stage 1 through stage 4 chronic kidney disease, or unspecified chronic kidney disease: Secondary | ICD-10-CM | POA: Diagnosis present

## 2023-05-22 DIAGNOSIS — Z713 Dietary counseling and surveillance: Secondary | ICD-10-CM | POA: Diagnosis not present

## 2023-05-22 DIAGNOSIS — E1165 Type 2 diabetes mellitus with hyperglycemia: Secondary | ICD-10-CM | POA: Diagnosis present

## 2023-05-22 DIAGNOSIS — D631 Anemia in chronic kidney disease: Secondary | ICD-10-CM | POA: Diagnosis present

## 2023-05-22 DIAGNOSIS — E871 Hypo-osmolality and hyponatremia: Secondary | ICD-10-CM | POA: Diagnosis present

## 2023-05-22 DIAGNOSIS — Z87891 Personal history of nicotine dependence: Secondary | ICD-10-CM | POA: Diagnosis not present

## 2023-05-22 DIAGNOSIS — E785 Hyperlipidemia, unspecified: Secondary | ICD-10-CM | POA: Diagnosis present

## 2023-05-22 LAB — COMPREHENSIVE METABOLIC PANEL
ALT: 11 U/L (ref 0–44)
AST: 19 U/L (ref 15–41)
Albumin: 3.5 g/dL (ref 3.5–5.0)
Alkaline Phosphatase: 70 U/L (ref 38–126)
Anion gap: 9 (ref 5–15)
BUN: 36 mg/dL — ABNORMAL HIGH (ref 6–20)
CO2: 23 mmol/L (ref 22–32)
Calcium: 8.6 mg/dL — ABNORMAL LOW (ref 8.9–10.3)
Chloride: 100 mmol/L (ref 98–111)
Creatinine, Ser: 2.86 mg/dL — ABNORMAL HIGH (ref 0.61–1.24)
GFR, Estimated: 27 mL/min — ABNORMAL LOW (ref >60)
Glucose, Bld: 170 mg/dL — ABNORMAL HIGH (ref 70–99)
Potassium: 4.1 mmol/L (ref 3.5–5.1)
Sodium: 132 mmol/L — ABNORMAL LOW (ref 135–145)
Total Bilirubin: 0.5 mg/dL (ref 0.0–1.2)
Total Protein: 6.6 g/dL (ref 6.5–8.1)

## 2023-05-22 LAB — CBC
HCT: 30.3 % — ABNORMAL LOW (ref 39.0–52.0)
Hemoglobin: 10.3 g/dL — ABNORMAL LOW (ref 13.0–17.0)
MCH: 28.5 pg (ref 26.0–34.0)
MCHC: 34 g/dL (ref 30.0–36.0)
MCV: 83.7 fL (ref 80.0–100.0)
Platelets: 222 10*3/uL (ref 150–400)
RBC: 3.62 MIL/uL — ABNORMAL LOW (ref 4.22–5.81)
RDW: 12.1 % (ref 11.5–15.5)
WBC: 6.5 10*3/uL (ref 4.0–10.5)
nRBC: 0 % (ref 0.0–0.2)

## 2023-05-22 LAB — GLUCOSE, CAPILLARY
Glucose-Capillary: 105 mg/dL — ABNORMAL HIGH (ref 70–99)
Glucose-Capillary: 135 mg/dL — ABNORMAL HIGH (ref 70–99)
Glucose-Capillary: 147 mg/dL — ABNORMAL HIGH (ref 70–99)
Glucose-Capillary: 157 mg/dL — ABNORMAL HIGH (ref 70–99)
Glucose-Capillary: 166 mg/dL — ABNORMAL HIGH (ref 70–99)

## 2023-05-22 MED ORDER — INSULIN ASPART 100 UNIT/ML IJ SOLN
0.0000 [IU] | Freq: Three times a day (TID) | INTRAMUSCULAR | Status: DC
  Administered 2023-05-22: 1 [IU] via SUBCUTANEOUS
  Administered 2023-05-23: 2 [IU] via SUBCUTANEOUS
  Administered 2023-05-23: 3 [IU] via SUBCUTANEOUS
  Administered 2023-05-24: 1 [IU] via SUBCUTANEOUS
  Administered 2023-05-24: 2 [IU] via SUBCUTANEOUS

## 2023-05-22 MED ORDER — DIPHENHYDRAMINE HCL 25 MG PO CAPS
25.0000 mg | ORAL_CAPSULE | Freq: Four times a day (QID) | ORAL | Status: DC | PRN
  Administered 2023-05-22 – 2023-05-23 (×4): 25 mg via ORAL
  Filled 2023-05-22 (×4): qty 1

## 2023-05-22 MED ORDER — LACTATED RINGERS IV SOLN: INTRAVENOUS | Status: AC

## 2023-05-22 NOTE — Plan of Care (Signed)
  Problem: Education: Goal: Ability to describe self-care measures that may prevent or decrease complications (Diabetes Survival Skills Education) will improve Outcome: Progressing   Problem: Coping: Goal: Ability to adjust to condition or change in health will improve Outcome: Progressing

## 2023-05-22 NOTE — Hospital Course (Addendum)
 42 yom w/ HTN HLD, IDDM T2,CKD stage IIIa and suspected cannabinoid hyperemesis presented with vomiting 7 PM on 1/7, with associated abdominal pain generalized achiness with radiation into the back, felt like his previous pancreatitis in June 2024.  Patient encouraged to smoke marijuana occasionally, was not able to hold anything orally.  He also had decreased urine output. In the ED vitals stable besides mild tachycardia, labs showed hyponatremia, elevated creatinine 2.2> 3.0 mild anemia hyperglycemia in 240s UA with mild pyuria. CT on pelvis with contrast> no acute finding, aortic atherosclerosis noted. Patient is admitted for acute kidney injury in the setting of intractable vomiting, abdominal pain. Diet was advanced tolerating soft diet, no nausea vomiting and renal function nicely improved. He is stable for d/c home today

## 2023-05-22 NOTE — Progress Notes (Signed)
 PROGRESS NOTE Randy [name].  FMW:[id number] DOB: November 05, 1980 DOA: 05/20/2023 PCP: Tanda Bleacher, MD  Brief Narrative/Hospital Course: 62 yom w/ HTN HLD, IDDM T2,CKD stage IIIa and suspected cannabinoid hyperemesis presented with vomiting 7 PM on 1/7, with associated abdominal pain generalized achiness with radiation into the back, felt like his previous pancreatitis in June 2024.  Patient encouraged to smoke marijuana occasionally, was not able to hold anything orally.  He also had decreased urine output. In the ED vitals stable besides mild tachycardia, labs showed hyponatremia, elevated creatinine 2.2> 3.0 mild anemia hyperglycemia in 240s UA with mild pyuria. CT on pelvis with contrast> no acute finding, aortic atherosclerosis noted. Patient is admitted for acute kidney injury in the setting of intractable vomiting, abdominal pain.   Subjective: Patient seen and examined this morning Alert awake-wants to complains of nausea but wants to eat regular food Overnight afebrile, vitals otherwise stable. Labs reviewed-creatinine slightly downtrending 2.8 from 3, CBC with anemia hemoglobin 10.3 Episode of hypoglycemia at 29 and 1/8, 8 PM  Assessment and Plan: Principal Problem:   AKI (acute kidney injury) (HCC)   Intractable vomiting and abdominal pain Concern for cannabinoid hyperemesis: CT abdomen no acute finding.  Lipase minimally elevated likely from dehydration.Continue conservative management will clear liquid diet IVF PPI antiemetics and diet ADAT-to full liquid today hopefully soft later today or in the morning.  AKI on CKD stage IIIa: B/L Bun/Creat 19/1.6 in 01/29/2023.  Keep on gentle IVF monitor urine output renal function avoid nephrotoxic medication Recent Labs    10/20/22 0512 10/21/22 0822 10/22/22 0636 10/23/22 0305 10/24/22 0425 10/25/22 0504 01/29/23 1826 05/20/23 2034 05/21/23 0745 05/22/23 0438  BUN 15 19 15 11 10 11 19  34* 41* 36*  CREATININE 1.46* 1.80*  1.61* 1.58* 1.39* 1.58* 1.69* 2.25* 3.00* 2.86*  CO2 25 22 23 26 23 26 27 22 22  23  K 3.7 3.6 3.4* 3.5 3.5 3.7 4.4 5.0 5.0 4.1    IDDM with hypoglycemia, and uncontrolled hyperglycemia: PTA on-NovoLog  10 units 3 times daily, glargine 30 units daily. ?  Brittle diabetes, hypoglycemia overnight at 29.  Decrease to Semglee  10 units, keep on SSI mod scale. Recent Labs  Lab 05/21/23 1759 05/21/23 2025 05/21/23 2100 05/22/23 0023 05/22/23 0736  GLUCAP 165* 29* 140* 166* 147*   HTN HLD: BP stable. Cont amlodipine , Coreg , resume hydralazine  if remains poorly controlled.  Holding lisinopril /Maxide 2/2 AKI. Continue statin.  GERD: Continue PPI  Anemia of chronic kidney disease: B/l hb ~ 10-11gm. Stable, monitor.  Hepatic steatosis: Advised weight loss,   DVT prophylaxis: heparin  injection 5,000 Units Start: 05/21/23 1400 SCDs Start: 05/21/23 1029 Code Status:   Code Status: Full Code Family Communication: plan of care discussed with patient at bedside. Patient status is: Remains hospitalized because of severity of illness Level of care: Med-Surg   Dispo: The patient is from: home            Anticipated disposition: TBD Objective: Vitals last 24 hrs: Vitals:   05/21/23 2121 05/22/23 0204 05/22/23 0606 05/22/23 0935  BP: (!) 161/90 (!) 154/91 (!) 169/99 (!) 158/91  Pulse: 71 72 74 66  Resp: 16 16 16    Temp:  97.8 F (36.6 C) 98.1 F (36.7 C) 97.9 F (36.6 C)  TempSrc:  Oral Oral Oral  SpO2: 100% 100% 100% 100%   Weight change:   Physical Examination: General exam: alert awake,at baseline, older than stated age HEENT:Oral mucosa moist, Ear/Nose WNL grossly Respiratory system: Bilaterally clear BS,no  use of accessory muscle Cardiovascular system: S1 & S2 +, No JVD. Gastrointestinal system: Abdomen soft,NT,ND, BS+ Nervous System: Alert, awake, moving all extremities,and following commands. Extremities: LE edema neg,distal peripheral pulses palpable and warm.  Skin: No  rashes,no icterus. MSK: Normal muscle bulk,tone, power   Medications reviewed:  Scheduled Meds:  amLODipine   10 mg Oral Daily   atorvastatin   80 mg Oral Daily   carvedilol   25 mg Oral BID WC   heparin   5,000 Units Subcutaneous Q8H   insulin  aspart  0-9 Units Subcutaneous TID WC   insulin  glargine-yfgn  15 Units Subcutaneous Daily   pantoprazole   40 mg Oral BID   Continuous Infusions:  lactated ringers  100 mL/hr at 05/22/23 0945      Diet Order             Diet full liquid Fluid consistency: Thin  Diet effective now                   Intake/Output Summary (Last 24 hours) at 05/22/2023 1057 Last data filed at 05/22/2023 1000 Gross per 24 hour  Intake 2924.17 ml  Output 1600 ml  Net 1324.17 ml   Net IO Since Admission: 1,324.17 mL [05/22/23 1057]  Wt Readings from Last 3 Encounters:  04/04/23 89.4 kg  03/04/23 84.6 kg  01/29/23 87.3 kg     Unresulted Labs (From admission, onward)     Start     Ordered   05/23/23 0500  Basic metabolic panel  Daily,   R      05/22/23 0827   05/23/23 0500  CBC  Daily,   R      05/22/23 0827          Data Reviewed: I have personally reviewed following labs and imaging studies CBC: Recent Labs  Lab 05/20/23 2034 05/22/23 0438  WBC 7.3 6.5  NEUTROABS 4.8  --   HGB 11.7* 10.3*  HCT 34.9* 30.3*  MCV 83.9 83.7  PLT 251 222   Basic Metabolic Panel:  Recent Labs  Lab 05/20/23 2034 05/21/23 0745 05/22/23 0438  NA 132* 130* 132*  K 5.0 5.0 4.1  CL 99 100 100  CO2 22 22 23   GLUCOSE 247* 267* 170*  BUN 34* 41* 36*  CREATININE 2.25* 3.00* 2.86*  CALCIUM  9.1 8.6* 8.6*   GFR: CrCl cannot be calculated (Unknown ideal weight.). Liver Function Tests:  Recent Labs  Lab 05/20/23 2034 05/22/23 0438  AST 25 19  ALT 16 11  ALKPHOS 91 70  BILITOT 1.0 0.5  PROT 8.0 6.6  ALBUMIN 4.1 3.5   Recent Labs  Lab 05/20/23 2034  LIPASE 73*   Recent Labs  Lab 05/21/23 1759 05/21/23 2025 05/21/23 2100 05/22/23 0023  05/22/23 0736  GLUCAP 165* 29* 140* 166* 147*   Recent Labs    05/21/23 0745  TRIG 257*  No results found for this or any previous visit (from the past 240 hours).  Antimicrobials/Microbiology: Anti-infectives (From admission, onward)    None         Component Value Date/Time   SDES  05/29/2022 1009    BLOOD BLOOD LEFT ARM Performed at [facility], [address], [place], [place] [address]    SPECREQUEST  05/29/2022 1009    BOTTLES DRAWN AEROBIC ONLY Blood Culture adequate volume Performed at [facility], [address], [place], [place] [address]    CULT  05/29/2022 1009    NO GROWTH 5 DAYS Performed at  [facility], [address], [place], [place] [address]    REPTSTATUS 06/03/2022 FINAL 05/29/2022 1009     Radiology Studies: CT ABDOMEN PELVIS W CONTRAST Result Date: 05/21/2023 CLINICAL DATA:  Lower abdominal pain with nausea vomiting. Pancreatitis. EXAM: CT ABDOMEN AND PELVIS WITH CONTRAST TECHNIQUE: Multidetector CT imaging of the abdomen and pelvis was performed using the standard protocol following bolus administration of intravenous contrast. RADIATION DOSE REDUCTION: This exam was performed according to the departmental dose-optimization program which includes automated exposure control, adjustment of the mA and/or kV according to patient size and/or use of iterative reconstruction technique. CONTRAST:  75mL OMNIPAQUE  IOHEXOL  300 MG/ML  SOLN COMPARISON:  10/20/2022 FINDINGS: Lower chest: Unremarkable. Hepatobiliary: No suspicious focal abnormality within the liver parenchyma. There is no evidence for gallstones, gallbladder wall thickening, or pericholecystic fluid. No intrahepatic or extrahepatic biliary dilation. Pancreas: No focal mass lesion. No dilatation of the main duct. No intraparenchymal cyst. No peripancreatic edema. Spleen: No splenomegaly. No suspicious focal mass lesion. Adrenals/Urinary Tract: No adrenal nodule  or mass. Kidneys unremarkable. No evidence for hydroureter. The urinary bladder appears normal for the degree of distention. Stomach/Bowel: Stomach is unremarkable. No gastric wall thickening. No evidence of outlet obstruction. Duodenum is normally positioned as is the ligament of Treitz. No small bowel wall thickening. No small bowel dilatation. The terminal ileum is normal. The appendix is normal. No gross colonic mass. No colonic wall thickening. Vascular/Lymphatic: There is mild atherosclerotic calcification of the abdominal aorta without aneurysm. There is no gastrohepatic or hepatoduodenal ligament lymphadenopathy. No retroperitoneal or mesenteric lymphadenopathy. No pelvic sidewall lymphadenopathy. Reproductive: The prostate gland and seminal vesicles are unremarkable. Other: No intraperitoneal free fluid. Musculoskeletal: No worrisome lytic or sclerotic osseous abnormality. IMPRESSION: 1. No acute findings in the abdomen or pelvis. Specifically, no findings to explain the patient's history of lower abdominal pain with nausea and vomiting. No appreciable peripancreatic edema or inflammation to suggest pancreatitis. 2.  Aortic Atherosclerosis (ICD10-I70.0). Electronically Signed   By: Camellia Candle M.D.   On: 05/21/2023 06:55     LOS: 0 days   Total time spent in review of labs and imaging, patient evaluation, formulation of plan, documentation and communication with family: 50 minutes  Mennie LAMY, MD [facility]  05/22/2023, 10:57 AM

## 2023-05-22 NOTE — Inpatient Diabetes Management (Signed)
 Inpatient Diabetes Program Recommendations  AACE/ADA: New Consensus Statement on Inpatient Glycemic Control (2015)  Target Ranges:  Prepandial:   less than 140 mg/dL      Peak postprandial:   less than 180 mg/dL (1-2 hours)      Critically ill patients:  140 - 180 mg/dL   Lab Results  Component Value Date   GLUCAP 147 (H) 05/22/2023   HGBA1C 9.5 (A) 01/29/2023    Review of Glycemic Control  Latest Reference Range & Units 05/21/23 14:44 05/21/23 17:59 05/21/23 20:25 05/21/23 21:00 05/22/23 00:23 05/22/23 07:36  Glucose-Capillary 70 - 99 mg/dL 750 (H) 834 (H) 29 (LL) 140 (H) 166 (H) 147 (H)  (LL): Data is critically low (H): Data is abnormally high  Diabetes history: DM2/CKD Outpatient Diabetes medications: Basaglar  30 units every day, Novolog  0-10 units TID Current orders for Inpatient glycemic control: Semglee  15 units every day, Novolog  0-9 units TID  Inpatient Diabetes Program Recommendations:    MD decreased correction insulin  to Novolog  0-9 units after CBG of 29 mg/dL last night.  Might consider decreasing correction insulin  further to Novolog  0-6 units TID (very sensitive).  Will continue to follow while inpatient.  Thank you, Wyvonna Pinal, MSN, CDCES Diabetes Coordinator Inpatient Diabetes Program [phone] (team pager from 8a-5p)

## 2023-05-23 DIAGNOSIS — N179 Acute kidney failure, unspecified: Secondary | ICD-10-CM | POA: Diagnosis not present

## 2023-05-23 LAB — GLUCOSE, CAPILLARY
Glucose-Capillary: 174 mg/dL — ABNORMAL HIGH (ref 70–99)
Glucose-Capillary: 79 mg/dL (ref 70–99)
Glucose-Capillary: 213 mg/dL — ABNORMAL HIGH (ref 70–99)
Glucose-Capillary: 84 mg/dL (ref 70–99)

## 2023-05-23 LAB — BASIC METABOLIC PANEL
Anion gap: 7 (ref 5–15)
Anion gap: 7 (ref 5–15)
BUN: 22 mg/dL — ABNORMAL HIGH (ref 6–20)
BUN: 18 mg/dL (ref 6–20)
CO2: 24 mmol/L (ref 22–32)
CO2: 27 mmol/L (ref 22–32)
Calcium: 8.7 mg/dL — ABNORMAL LOW (ref 8.9–10.3)
Calcium: 8.8 mg/dL — ABNORMAL LOW (ref 8.9–10.3)
Chloride: 106 mmol/L (ref 98–111)
Chloride: 102 mmol/L (ref 98–111)
Creatinine, Ser: 2.04 mg/dL — ABNORMAL HIGH (ref 0.61–1.24)
Creatinine, Ser: 1.96 mg/dL — ABNORMAL HIGH (ref 0.61–1.24)
GFR, Estimated: 41 mL/min — ABNORMAL LOW (ref >60)
GFR, Estimated: 43 mL/min — ABNORMAL LOW (ref >60)
Glucose, Bld: 78 mg/dL (ref 70–99)
Glucose, Bld: 72 mg/dL (ref 70–99)
Potassium: 4.1 mmol/L (ref 3.5–5.1)
Potassium: 4.1 mmol/L (ref 3.5–5.1)
Sodium: 137 mmol/L (ref 135–145)
Sodium: 136 mmol/L (ref 135–145)

## 2023-05-23 LAB — CBC
HCT: 31.5 % — ABNORMAL LOW (ref 39.0–52.0)
Hemoglobin: 10.1 g/dL — ABNORMAL LOW (ref 13.0–17.0)
MCH: 27.7 pg (ref 26.0–34.0)
MCHC: 32.1 g/dL (ref 30.0–36.0)
MCV: 86.5 fL (ref 80.0–100.0)
Platelets: 232 10*3/uL (ref 150–400)
RBC: 3.64 MIL/uL — ABNORMAL LOW (ref 4.22–5.81)
RDW: 12.3 % (ref 11.5–15.5)
WBC: 5.7 10*3/uL (ref 4.0–10.5)
nRBC: 0 % (ref 0.0–0.2)

## 2023-05-23 MED ORDER — ONDANSETRON HCL 4 MG PO TABS: 4.0000 mg | ORAL_TABLET | Freq: Three times a day (TID) | ORAL | 0 refills | Status: AC | PRN

## 2023-05-23 MED ORDER — HYDROMORPHONE HCL 1 MG/ML IJ SOLN
0.5000 mg | INTRAMUSCULAR | Status: DC | PRN
  Administered 2023-05-23 – 2023-05-24 (×3): 0.5 mg via INTRAVENOUS
  Filled 2023-05-23 (×3): qty 0.5

## 2023-05-23 MED ORDER — LACTATED RINGERS IV SOLN: INTRAVENOUS | Status: DC

## 2023-05-23 NOTE — Progress Notes (Signed)
 PROGRESS NOTE Randy [name].  FMW:[id number] DOB: 1981/03/28 DOA: 05/20/2023 PCP: Tanda Bleacher, MD  Brief Narrative/Hospital Course: 10 yom w/ HTN HLD, IDDM T2,CKD stage IIIa and suspected cannabinoid hyperemesis presented with vomiting 7 PM on 1/7, with associated abdominal pain generalized achiness with radiation into the back, felt like his previous pancreatitis in June 2024.  Patient encouraged to smoke marijuana occasionally, was not able to hold anything orally.  He also had decreased urine output. In the ED vitals stable besides mild tachycardia, labs showed hyponatremia, elevated creatinine 2.2> 3.0 mild anemia hyperglycemia in 240s UA with mild pyuria. CT on pelvis with contrast> no acute finding, aortic atherosclerosis noted. Patient is admitted for acute kidney injury in the setting of intractable vomiting, abdominal pain.   Subjective: Seen and examined, Overnight vitals/labs/events reviewed  Overnight afebrile, BP stable 130s to 160s, labs showing renal function improving sodium potassium stable No more hypoglycemia blood sugar controlled Patient continues to need pain management, on soft diet per request He complains of some nausea does not feel ready to go home  Assessment and Plan: Principal Problem:   AKI (acute kidney injury) (HCC)  Intractable vomiting and abdominal pain Concern for cannabinoid hyperemesis: CT abdomen no acute finding.  Lipase minimally elevated likely from dehydration. Encourage cannabis cessation.overall improving but having some nausea. Conservative management with IV fluid hydration, soft diet, pain management antiemetics.  AKI on CKD stage IIIa: B/L Bun/Creat 19/1.6 in 01/29/2023.  Renal function improving with IV fluid hydration, encourage oral intake. Cont ivf, monitor urine output renal function avoid nephrotoxic medication-overall improving but having some nausea this afternoon and does not feel ready yet Recent Labs    10/21/22 0822  10/22/22 0636 10/23/22 0305 10/24/22 0425 10/25/22 0504 01/29/23 1826 05/20/23 2034 05/21/23 0745 05/22/23 0438 05/23/23 0504  BUN 19 15 11 10 11 19  34* 41* 36* 22*  CREATININE 1.80* 1.61* 1.58* 1.39* 1.58* 1.69* 2.25* 3.00* 2.86* 2.04*  CO2 22 23 26 23 26 27 22 22 23  24  K 3.6 3.4* 3.5 3.5 3.7 4.4 5.0 5.0 4.1 4.1    IDDM with hypoglycemia, and uncontrolled hyperglycemia: PTA on-NovoLog  10 units 3 times daily, glargine 30 units daily. Patient hypoglycemic due to n.p.o. status, blood sugar overall controlled on Semglee  15 units and SSI Recent Labs  Lab 05/22/23 0736 05/22/23 1154 05/22/23 1700 05/22/23 2057 05/23/23 0729  GLUCAP 147* 105* 157* 135* 174*   HTN HLD: BP stable. Cont amlodipine , Coreg .Holding lisinopril /Maxide 2/2 AKI. Continue statin.  GERD: Continue PPI  Anemia of chronic kidney disease: B/l hb ~ 10-11gm. Stable, monitor.  Hepatic steatosis: Advised weight loss  DVT prophylaxis: heparin  injection 5,000 Units Start: 05/21/23 1400 SCDs Start: 05/21/23 1029 Code Status:   Code Status: Full Code Family Communication: plan of care discussed with patient at bedside. Patient status is: Remains hospitalized because of severity of illness Level of care: Med-Surg   Dispo: The patient is from: home            Anticipated disposition: Home in 24 hours if renal function improves further  Objective: Vitals last 24 hrs: Vitals:   05/22/23 0935 05/22/23 1703 05/22/23 2059 05/23/23 0524  BP: (!) 158/91 (!) 175/97 134/81 (!) 161/98  Pulse: 66 68 73 72  Resp:   16 16  Temp: 97.9 F (36.6 C) 98.4 F (36.9 C) 98.2 F (36.8 C) 98 F (36.7 C)  TempSrc: Oral Oral Oral Oral  SpO2: 100% 100% 98% 99%   Weight change:  Physical Examination: General exam: alert awake HEENT:Oral mucosa moist, Ear/Nose WNL grossly Respiratory system: Bilaterally clear BS,no use of accessory muscle Cardiovascular system: S1 & S2 +, No JVD. Gastrointestinal system: Abdomen  soft,NT,ND, BS+ Nervous System: Alert, awake, moving all extremities,and following commands. Extremities: LE edema neg,distal peripheral pulses palpable and warm.  Skin: No rashes,no icterus. MSK: Normal muscle bulk,tone, power   Medications reviewed:  Scheduled Meds:  amLODipine   10 mg Oral Daily   atorvastatin   80 mg Oral Daily   carvedilol   25 mg Oral BID WC   heparin   5,000 Units Subcutaneous Q8H   insulin  aspart  0-9 Units Subcutaneous TID WC   insulin  glargine-yfgn  15 Units Subcutaneous Daily   pantoprazole   40 mg Oral BID   Continuous Infusions:  lactated ringers  100 mL/hr at 05/23/23 9663      Diet Order             DIET SOFT Room service appropriate? Yes; Fluid consistency: Thin  Diet effective now                   Intake/Output Summary (Last 24 hours) at 05/23/2023 0840 Last data filed at 05/23/2023 0600 Gross per 24 hour  Intake 2930.45 ml  Output 2950 ml  Net -19.55 ml   Net IO Since Admission: 1,159.62 mL [05/23/23 0840]  Wt Readings from Last 3 Encounters:  04/04/23 89.4 kg  03/04/23 84.6 kg  01/29/23 87.3 kg     Unresulted Labs (From admission, onward)     Start     Ordered   05/23/23 0500  Basic metabolic panel  Daily,   R      05/22/23 0827   05/23/23 0500  CBC  Daily,   R      05/22/23 0827          Data Reviewed: I have personally reviewed following labs and imaging studies CBC: Recent Labs  Lab 05/20/23 2034 05/22/23 0438 05/23/23 0504  WBC 7.3 6.5 5.7  NEUTROABS 4.8  --   --   HGB 11.7* 10.3* 10.1*  HCT 34.9* 30.3* 31.5*  MCV 83.9 83.7 86.5  PLT 251 222 232   Basic Metabolic Panel:  Recent Labs  Lab 05/20/23 2034 05/21/23 0745 05/22/23 0438 05/23/23 0504  NA 132* 130* 132* 137  K 5.0 5.0 4.1 4.1  CL 99 100 100 106  CO2 22 22 23 24   GLUCOSE 247* 267* 170* 78  BUN 34* 41* 36* 22*  CREATININE 2.25* 3.00* 2.86* 2.04*  CALCIUM  9.1 8.6* 8.6* 8.7*   GFR: CrCl cannot be calculated (Unknown ideal weight.). Liver  Function Tests:  Recent Labs  Lab 05/20/23 2034 05/22/23 0438  AST 25 19  ALT 16 11  ALKPHOS 91 70  BILITOT 1.0 0.5  PROT 8.0 6.6  ALBUMIN 4.1 3.5   Recent Labs  Lab 05/20/23 2034  LIPASE 73*   Recent Labs  Lab 05/22/23 0736 05/22/23 1154 05/22/23 1700 05/22/23 2057 05/23/23 0729  GLUCAP 147* 105* 157* 135* 174*   Recent Labs    05/21/23 0745  TRIG 257*  No results found for this or any previous visit (from the past 240 hours).  Antimicrobials/Microbiology: Anti-infectives (From admission, onward)    None         Component Value Date/Time   SDES  05/29/2022 1009    BLOOD BLOOD LEFT ARM Performed at [facility], [address] [place], [place] [address]    SPECREQUEST  05/29/2022 1009  BOTTLES DRAWN AEROBIC ONLY Blood Culture adequate volume Performed at [facility], [address], [place], [place] [address]    CULT  05/29/2022 1009    NO GROWTH 5 DAYS Performed at [facility], [address] [place], [place] [address]    REPTSTATUS 06/03/2022 FINAL 05/29/2022 1009     Radiology Studies: No results found.    LOS: 1 day   Total time spent in review of labs and imaging, patient evaluation, formulation of plan, documentation and communication with family: 35 minutes  Mennie LAMY, MD [facility]  05/23/2023, 8:40 AM

## 2023-05-23 NOTE — Plan of Care (Signed)
   Problem: Coping: Goal: Ability to adjust to condition or change in health will improve Outcome: Progressing   Problem: Fluid Volume: Goal: Ability to maintain a balanced intake and output will improve Outcome: Progressing

## 2023-05-24 DIAGNOSIS — N179 Acute kidney failure, unspecified: Secondary | ICD-10-CM | POA: Diagnosis not present

## 2023-05-24 LAB — CBC
HCT: 31.7 % — ABNORMAL LOW (ref 39.0–52.0)
Hemoglobin: 10.6 g/dL — ABNORMAL LOW (ref 13.0–17.0)
MCH: 28 pg (ref 26.0–34.0)
MCHC: 33.4 g/dL (ref 30.0–36.0)
MCV: 83.6 fL (ref 80.0–100.0)
Platelets: 220 10*3/uL (ref 150–400)
RBC: 3.79 MIL/uL — ABNORMAL LOW (ref 4.22–5.81)
RDW: 12.1 % (ref 11.5–15.5)
WBC: 4.9 10*3/uL (ref 4.0–10.5)
nRBC: 0 % (ref 0.0–0.2)

## 2023-05-24 LAB — BASIC METABOLIC PANEL
Anion gap: 8 (ref 5–15)
BUN: 13 mg/dL (ref 6–20)
CO2: 24 mmol/L (ref 22–32)
Calcium: 8.9 mg/dL (ref 8.9–10.3)
Chloride: 104 mmol/L (ref 98–111)
Creatinine, Ser: 1.75 mg/dL — ABNORMAL HIGH (ref 0.61–1.24)
GFR, Estimated: 49 mL/min — ABNORMAL LOW (ref >60)
Glucose, Bld: 129 mg/dL — ABNORMAL HIGH (ref 70–99)
Potassium: 4.5 mmol/L (ref 3.5–5.1)
Sodium: 136 mmol/L (ref 135–145)

## 2023-05-24 LAB — GLUCOSE, CAPILLARY
Glucose-Capillary: 157 mg/dL — ABNORMAL HIGH (ref 70–99)
Glucose-Capillary: 122 mg/dL — ABNORMAL HIGH (ref 70–99)

## 2023-05-24 NOTE — Discharge Summary (Signed)
 Physician Discharge Summary  Randy [name]. FMW:[id number] DOB: [birth year] DOA: 05/20/2023  PCP: Tanda Bleacher, MD  Admit date: 05/20/2023 Discharge date: 05/24/2023 Recommendations for Outpatient Follow-up:  Follow up with PCP in 1 weeks-call for appointment Please obtain BMP/CBC in one week  Discharge Dispo: Home Discharge Condition: Stable Code Status:   Code Status: Full Code Diet recommendation:  Diet Order             DIET SOFT Room service appropriate? Yes; Fluid consistency: Thin  Diet effective now                    Brief/Interim Summary: 42 yom w/ HTN HLD, IDDM T2,CKD stage IIIa and suspected cannabinoid hyperemesis presented with vomiting 7 PM on 1/7, with associated abdominal pain generalized achiness with radiation into the back, felt like his previous pancreatitis in June 2024.  Patient encouraged to smoke marijuana occasionally, was not able to hold anything orally.  He also had decreased urine output. In the ED vitals stable besides mild tachycardia, labs showed hyponatremia, elevated creatinine 2.2> 3.0 mild anemia hyperglycemia in 240s UA with mild pyuria. CT on pelvis with contrast> no acute finding, aortic atherosclerosis noted. Patient is admitted for acute kidney injury in the setting of intractable vomiting, abdominal pain. Diet was advanced tolerating soft diet, no nausea vomiting and renal function nicely improved. He is stable for d/c home today     Discharge Diagnoses:  Principal Problem:   AKI (acute kidney injury) (HCC)  Intractable vomiting and abdominal pain Concern for cannabinoid hyperemesis: CT abdomen no acute finding.  Lipase minimally elevated likely from dehydration.  Clinically improved, tolerating diet encouraged about cannabis cessation.  Continue prn Zofran  at home  AKI on CKD stage IIIa: B/L Bun/Creat 19/1.6 in 01/29/2023.  Renal function improving with IV fluid hydration, encourage oral intake discontinue IV fluids creatinine  improved close to baseline. Recent Labs    10/23/22 0305 10/24/22 0425 10/25/22 0504 01/29/23 1826 05/20/23 2034 05/21/23 0745 05/22/23 0438 05/23/23 0504 05/23/23 1223 05/24/23 0711  BUN 11 10 11 19  34* 41* 36* 22* 18 13  CREATININE 1.58* 1.39* 1.58* 1.69* 2.25* 3.00* 2.86* 2.04* 1.96* 1.75*  CO2 26 23 26 27 22 22 23 24 27  24  K 3.5 3.5 3.7 4.4 5.0 5.0 4.1 4.1 4.1 4.5    IDDM with hypoglycemia, and uncontrolled hyperglycemia: PTA on-NovoLog  10 units 3 times daily, glargine 30 units daily. Patient hypoglycemic due to n.p.o. status, blood sugar overall controlled > can resume his home insulin  regimen slowly at home  and follow-up with his primary care  Recent Labs  Lab 05/23/23 1147 05/23/23 1629 05/23/23 2001 05/24/23 0738 05/24/23 1143  GLUCAP 79 213* 84 157* 122*   HTN HLD: BP stable. Cont amlodipine , Coreg .on lisinopril  and /Maxide and resume slowly.    GERD: Continue PPI  Anemia of chronic kidney disease: B/l hb ~ 10-11gm. Stable, monitor.  Hepatic steatosis: Advised weight loss  Consults: none Subjective: Aaox3, ok for home today Tolerating diet Discharge Exam: Vitals:   05/24/23 0509 05/24/23 1010  BP: (!) 152/91 (!) 152/91  Pulse: 65   Resp: 15   Temp: 98.2 F (36.8 C)   SpO2: 98%    General: Pt is alert, awake, not in acute distress Cardiovascular: RRR, S1/S2 +, no rubs, no gallops Respiratory: CTA bilaterally, no wheezing, no rhonchi Abdominal: Soft, NT, ND, bowel sounds + Extremities: no edema, no cyanosis  Discharge Instructions  Discharge Instructions  Discharge instructions   Complete by: As directed    Please call call MD or return to ER for similar or worsening recurring problem that brought you to hospital or if any fever,nausea/vomiting,abdominal pain, uncontrolled pain, chest pain,  shortness of breath or any other alarming symptoms.  Please follow-up your doctor as instructed in a week time and call the office for  appointment.  Please avoid alcohol, smoking, or any other illicit substance and maintain healthy habits including taking your regular medications as prescribed.  You were cared for by a hospitalist during your hospital stay. If you have any questions about your discharge medications or the care you received while you were in the hospital after you are discharged, you can call the unit and ask to speak with the hospitalist on call if the hospitalist that took care of you is not available.  Once you are discharged, your primary care physician will handle any further medical issues. Please note that NO REFILLS for any discharge medications will be authorized once you are discharged, as it is imperative that you return to your primary care physician (or establish a relationship with a primary care physician if you do not have one) for your aftercare needs so that they can reassess your need for medications and monitor your lab values   Please resume your home meds slowly including insulin  and antihypertensives   Increase activity slowly   Complete by: As directed       Allergies as of 05/24/2023       Reactions   Tomato Itching        Medication List     STOP taking these medications    lisinopril  20 MG tablet Commonly known as: ZESTRIL    triamterene -hydrochlorothiazide  75-50 MG tablet Commonly known as: MAXZIDE       TAKE these medications    Accu-Chek Softclix Lancets lancets USE UP TO 4 TIMES DAILY   amLODipine  10 MG tablet Commonly known as: NORVASC  Take 1 tablet (10 mg total) by mouth daily.   atorvastatin  80 MG tablet Commonly known as: LIPITOR  Take 1 tablet (80 mg total) by mouth daily.   Basaglar  KwikPen 100 UNIT/ML Inject 34 Units into the skin daily. What changed:  how much to take when to take this reasons to take this   blood glucose meter kit and supplies Kit Dispense based on patient and insurance preference. Use up to four times daily as directed.    carvedilol  25 MG tablet Commonly known as: COREG  Take 1 tablet (25 mg total) by mouth 2 (two) times daily with a meal.   FreeStyle Libre 2 Sensor Misc Use to check blood glucose continuously. Change sensors once every 14 days.   hydrALAZINE  100 MG tablet Commonly known as: APRESOLINE  Take 1 tablet (100 mg total) by mouth 2 (two) times daily.   insulin  aspart 100 UNIT/ML FlexPen Commonly known as: NOVOLOG  Inject 10 Units into the skin 3 (three) times daily with meals. What changed: how much to take   ondansetron  4 MG tablet Commonly known as: Zofran  Take 1 tablet (4 mg total) by mouth every 8 (eight) hours as needed for nausea or vomiting.   pantoprazole  40 MG tablet Commonly known as: Protonix  Take 1 tablet (40 mg total) by mouth 2 (two) times daily. What changed:  when to take this reasons to take this   sildenafil  100 MG tablet Commonly known as: Viagra  Take 0.5-1 tablets (50-100 mg total) by mouth daily as needed for erectile dysfunction.  True Metrix Blood Glucose Test test strip Generic drug: glucose blood USE UP TO FOUR TIMES DAILY AS DIRECTED   Blood Glucose Test Strips 333 Strp Utilize to check blood sugar TID   TRUEplus 5-Bevel Pen Needles 32G X 4 MM Misc Generic drug: Insulin  Pen Needle Use to inject Basaglar  once daily and Novolog  three times daily. Max: 4 pen needles daily.        Allergies  Allergen Reactions   Tomato Itching    The results of significant diagnostics from this hospitalization (including imaging, microbiology, ancillary and laboratory) are listed below for reference.    Microbiology: No results found for this or any previous visit (from the past 240 hours).  Procedures/Studies: CT ABDOMEN PELVIS W CONTRAST Result Date: 05/21/2023 CLINICAL DATA:  Lower abdominal pain with nausea vomiting. Pancreatitis. EXAM: CT ABDOMEN AND PELVIS WITH CONTRAST TECHNIQUE: Multidetector CT imaging of the abdomen and pelvis was performed using the  standard protocol following bolus administration of intravenous contrast. RADIATION DOSE REDUCTION: This exam was performed according to the departmental dose-optimization program which includes automated exposure control, adjustment of the mA and/or kV according to patient size and/or use of iterative reconstruction technique. CONTRAST:  75mL OMNIPAQUE  IOHEXOL  300 MG/ML  SOLN COMPARISON:  10/20/2022 FINDINGS: Lower chest: Unremarkable. Hepatobiliary: No suspicious focal abnormality within the liver parenchyma. There is no evidence for gallstones, gallbladder wall thickening, or pericholecystic fluid. No intrahepatic or extrahepatic biliary dilation. Pancreas: No focal mass lesion. No dilatation of the main duct. No intraparenchymal cyst. No peripancreatic edema. Spleen: No splenomegaly. No suspicious focal mass lesion. Adrenals/Urinary Tract: No adrenal nodule or mass. Kidneys unremarkable. No evidence for hydroureter. The urinary bladder appears normal for the degree of distention. Stomach/Bowel: Stomach is unremarkable. No gastric wall thickening. No evidence of outlet obstruction. Duodenum is normally positioned as is the ligament of Treitz. No small bowel wall thickening. No small bowel dilatation. The terminal ileum is normal. The appendix is normal. No gross colonic mass. No colonic wall thickening. Vascular/Lymphatic: There is mild atherosclerotic calcification of the abdominal aorta without aneurysm. There is no gastrohepatic or hepatoduodenal ligament lymphadenopathy. No retroperitoneal or mesenteric lymphadenopathy. No pelvic sidewall lymphadenopathy. Reproductive: The prostate gland and seminal vesicles are unremarkable. Other: No intraperitoneal free fluid. Musculoskeletal: No worrisome lytic or sclerotic osseous abnormality. IMPRESSION: 1. No acute findings in the abdomen or pelvis. Specifically, no findings to explain the patient's history of lower abdominal pain with nausea and vomiting. No  appreciable peripancreatic edema or inflammation to suggest pancreatitis. 2.  Aortic Atherosclerosis (ICD10-I70.0). Electronically Signed   By: Camellia Candle M.D.   On: 05/21/2023 06:55    Labs: BNP (last 3 results) No results for input(s): BNP in the last 8760 hours. Basic Metabolic Panel: Recent Labs  Lab 05/21/23 0745 05/22/23 0438 05/23/23 0504 05/23/23 1223 05/24/23 0711  NA 130* 132* 137 136 136  K 5.0 4.1 4.1 4.1 4.5  CL 100 100 106 102 104  CO2 22 23 24 27 24   GLUCOSE 267* 170* 78 72 129*  BUN 41* 36* 22* 18 13  CREATININE 3.00* 2.86* 2.04* 1.96* 1.75*  CALCIUM  8.6* 8.6* 8.7* 8.8* 8.9   Liver Function Tests: Recent Labs  Lab 05/20/23 2034 05/22/23 0438  AST 25 19  ALT 16 11  ALKPHOS 91 70  BILITOT 1.0 0.5  PROT 8.0 6.6  ALBUMIN 4.1 3.5   Recent Labs  Lab 05/20/23 2034  LIPASE 73*   No results for input(s): AMMONIA in the last 168 hours.  CBC: Recent Labs  Lab 05/20/23 2034 05/22/23 0438 05/23/23 0504 05/24/23 0711  WBC 7.3 6.5 5.7 4.9  NEUTROABS 4.8  --   --   --   HGB 11.7* 10.3* 10.1* 10.6*  HCT 34.9* 30.3* 31.5* 31.7*  MCV 83.9 83.7 86.5 83.6  PLT 251 222 232 220   Cardiac Enzymes: No results for input(s): CKTOTAL, CKMB, CKMBINDEX, TROPONINI in the last 168 hours. BNP: Invalid input(s): POCBNP CBG: Recent Labs  Lab 05/23/23 1147 05/23/23 1629 05/23/23 2001 05/24/23 0738 05/24/23 1143  GLUCAP 79 213* 84 157* 122*   D-Dimer No results for input(s): DDIMER in the last 72 hours. Hgb A1c No results for input(s): HGBA1C in the last 72 hours. Lipid Profile No results for input(s): CHOL, HDL, LDLCALC, TRIG, CHOLHDL, LDLDIRECT in the last 72 hours. Thyroid  function studies No results for input(s): TSH, T4TOTAL, T3FREE, THYROIDAB in the last 72 hours.  Invalid input(s): FREET3 Anemia work up No results for input(s): VITAMINB12, FOLATE, FERRITIN, TIBC, IRON, RETICCTPCT in the last 72  hours. Urinalysis    Component Value Date/Time   COLORURINE YELLOW 05/20/2023 0904   APPEARANCEUR HAZY (A) 05/20/2023 0904   LABSPEC 1.025 05/20/2023 0904   PHURINE 5.0 05/20/2023 0904   GLUCOSEU 150 (A) 05/20/2023 0904   HGBUR NEGATIVE 05/20/2023 0904   BILIRUBINUR NEGATIVE 05/20/2023 0904   KETONESUR NEGATIVE 05/20/2023 0904   PROTEINUR 100 (A) 05/20/2023 0904   UROBILINOGEN 1.0 10/19/2013 2130   NITRITE NEGATIVE 05/20/2023 0904   LEUKOCYTESUR SMALL (A) 05/20/2023 0904   Sepsis Labs Recent Labs  Lab 05/20/23 2034 05/22/23 0438 05/23/23 0504 05/24/23 0711  WBC 7.3 6.5 5.7 4.9   Microbiology No results found for this or any previous visit (from the past 240 hours).   Time coordinating discharge: 25 minutes  SIGNED: Mennie LAMY, MD  [facility] 05/24/2023, 1:25 PM  If 7PM-7AM, please contact night-coverage [email or web address]

## 2023-05-24 NOTE — Plan of Care (Signed)
 Problem: Education: Goal: Ability to describe self-care measures that may prevent or decrease complications (Diabetes Survival Skills Education) will improve Outcome: Adequate for Discharge Goal: Individualized Educational Video(s) Outcome: Adequate for Discharge   Problem: Coping: Goal: Ability to adjust to condition or change in health will improve Outcome: Adequate for Discharge   Problem: Fluid Volume: Goal: Ability to maintain a balanced intake and output will improve Outcome: Adequate for Discharge   Problem: Health Behavior/Discharge Planning: Goal: Ability to identify and utilize available resources and services will improve Outcome: Adequate for Discharge Goal: Ability to manage health-related needs will improve Outcome: Adequate for Discharge   Problem: Metabolic: Goal: Ability to maintain appropriate glucose levels will improve Outcome: Adequate for Discharge   Problem: Nutritional: Goal: Maintenance of adequate nutrition will improve Outcome: Adequate for Discharge Goal: Progress toward achieving an optimal weight will improve Outcome: Adequate for Discharge   Problem: Skin Integrity: Goal: Risk for impaired skin integrity will decrease Outcome: Adequate for Discharge   Problem: Tissue Perfusion: Goal: Adequacy of tissue perfusion will improve Outcome: Adequate for Discharge   Problem: Education: Goal: Knowledge of General Education information will improve Description: Including pain rating scale, medication(s)/side effects and non-pharmacologic comfort measures Outcome: Adequate for Discharge   Problem: Health Behavior/Discharge Planning: Goal: Ability to manage health-related needs will improve Outcome: Adequate for Discharge   Problem: Clinical Measurements: Goal: Ability to maintain clinical measurements within normal limits will improve Outcome: Adequate for Discharge Goal: Will remain free from infection Outcome: Adequate for Discharge Goal:  Diagnostic test results will improve Outcome: Adequate for Discharge Goal: Respiratory complications will improve Outcome: Adequate for Discharge Goal: Cardiovascular complication will be avoided Outcome: Adequate for Discharge   Problem: Activity: Goal: Risk for activity intolerance will decrease Outcome: Adequate for Discharge   Problem: Nutrition: Goal: Adequate nutrition will be maintained Outcome: Adequate for Discharge   Problem: Coping: Goal: Level of anxiety will decrease Outcome: Adequate for Discharge   Problem: Elimination: Goal: Will not experience complications related to bowel motility Outcome: Adequate for Discharge Goal: Will not experience complications related to urinary retention Outcome: Adequate for Discharge   Problem: Pain Management: Goal: General experience of comfort will improve Outcome: Adequate for Discharge   Problem: Safety: Goal: Ability to remain free from injury will improve Outcome: Adequate for Discharge   Problem: Skin Integrity: Goal: Risk for impaired skin integrity will decrease Outcome: Adequate for Discharge   Problem: Education: Goal: Knowledge of General Education information will improve Description: Including pain rating scale, medication(s)/side effects and non-pharmacologic comfort measures Outcome: Adequate for Discharge   Problem: Health Behavior/Discharge Planning: Goal: Ability to manage health-related needs will improve Outcome: Adequate for Discharge   Problem: Clinical Measurements: Goal: Ability to maintain clinical measurements within normal limits will improve Outcome: Adequate for Discharge Goal: Will remain free from infection Outcome: Adequate for Discharge Goal: Diagnostic test results will improve Outcome: Adequate for Discharge Goal: Respiratory complications will improve Outcome: Adequate for Discharge Goal: Cardiovascular complication will be avoided Outcome: Adequate for Discharge   Problem:  Activity: Goal: Risk for activity intolerance will decrease Outcome: Adequate for Discharge   Problem: Nutrition: Goal: Adequate nutrition will be maintained Outcome: Adequate for Discharge   Problem: Coping: Goal: Level of anxiety will decrease Outcome: Adequate for Discharge   Problem: Elimination: Goal: Will not experience complications related to bowel motility Outcome: Adequate for Discharge Goal: Will not experience complications related to urinary retention Outcome: Adequate for Discharge   Problem: Pain Management: Goal: General experience of comfort  will improve Outcome: Adequate for Discharge   Problem: Safety: Goal: Ability to remain free from injury will improve Outcome: Adequate for Discharge   Problem: Skin Integrity: Goal: Risk for impaired skin integrity will decrease Outcome: Adequate for Discharge

## 2023-05-24 NOTE — Progress Notes (Signed)
 Pt discharged via wc to front entrance. Verbalized understanding of dc instructions. Assessment unchanged.

## 2023-05-24 NOTE — Progress Notes (Signed)
 AVS reviewed w/ patient - meds given her today  updated in pen on AVS by this patient. PIV removed by RN. Pt's  family enroute to hospital w/ clothes for pt. PIV removed as noted.

## 2023-05-25 ENCOUNTER — Encounter (HOSPITAL_COMMUNITY): Payer: Self-pay

## 2023-05-25 ENCOUNTER — Other Ambulatory Visit: Payer: Self-pay

## 2023-05-25 ENCOUNTER — Emergency Department (HOSPITAL_COMMUNITY)
Admission: EM | Admit: 2023-05-25 | Discharge: 2023-05-25 | Disposition: A | Discharge status: Home / Self Care | Payer: 59 | Attending: Emergency Medicine | Admitting: Emergency Medicine

## 2023-05-25 ENCOUNTER — Emergency Department (HOSPITAL_COMMUNITY): Payer: 59

## 2023-05-25 DIAGNOSIS — I10 Essential (primary) hypertension: Secondary | ICD-10-CM | POA: Insufficient documentation

## 2023-05-25 DIAGNOSIS — Z79899 Other long term (current) drug therapy: Secondary | ICD-10-CM | POA: Insufficient documentation

## 2023-05-25 DIAGNOSIS — N179 Acute kidney failure, unspecified: Secondary | ICD-10-CM | POA: Diagnosis not present

## 2023-05-25 DIAGNOSIS — R112 Nausea with vomiting, unspecified: Secondary | ICD-10-CM

## 2023-05-25 DIAGNOSIS — E119 Type 2 diabetes mellitus without complications: Secondary | ICD-10-CM | POA: Diagnosis not present

## 2023-05-25 DIAGNOSIS — Z794 Long term (current) use of insulin: Secondary | ICD-10-CM | POA: Diagnosis not present

## 2023-05-25 DIAGNOSIS — R1013 Epigastric pain: Secondary | ICD-10-CM | POA: Diagnosis not present

## 2023-05-25 LAB — CBC WITH DIFFERENTIAL/PLATELET
Abs Immature Granulocytes: 0.01 10*3/uL (ref 0.00–0.07)
Basophils Absolute: 0.1 10*3/uL (ref 0.0–0.1)
Basophils Relative: 2 %
Eosinophils Absolute: 0 10*3/uL (ref 0.0–0.5)
Eosinophils Relative: 1 %
HCT: 35.4 % — ABNORMAL LOW (ref 39.0–52.0)
Hemoglobin: 11.3 g/dL — ABNORMAL LOW (ref 13.0–17.0)
Immature Granulocytes: 0 %
Lymphocytes Relative: 33 %
Lymphs Abs: 1.8 10*3/uL (ref 0.7–4.0)
MCH: 27.4 pg (ref 26.0–34.0)
MCHC: 31.9 g/dL (ref 30.0–36.0)
MCV: 85.9 fL (ref 80.0–100.0)
Monocytes Absolute: 0.4 10*3/uL (ref 0.1–1.0)
Monocytes Relative: 8 %
Neutro Abs: 3.1 10*3/uL (ref 1.7–7.7)
Neutrophils Relative %: 56 %
Platelets: 236 10*3/uL (ref 150–400)
RBC: 4.12 MIL/uL — ABNORMAL LOW (ref 4.22–5.81)
RDW: 11.9 % (ref 11.5–15.5)
WBC: 5.4 10*3/uL (ref 4.0–10.5)
nRBC: 0 % (ref 0.0–0.2)

## 2023-05-25 LAB — COMPREHENSIVE METABOLIC PANEL
ALT: 16 U/L (ref 0–44)
AST: 23 U/L (ref 15–41)
Albumin: 3.6 g/dL (ref 3.5–5.0)
Alkaline Phosphatase: 80 U/L (ref 38–126)
Anion gap: 10 (ref 5–15)
BUN: 17 mg/dL (ref 6–20)
CO2: 20 mmol/L — ABNORMAL LOW (ref 22–32)
Calcium: 9.2 mg/dL (ref 8.9–10.3)
Chloride: 105 mmol/L (ref 98–111)
Creatinine, Ser: 2.03 mg/dL — ABNORMAL HIGH (ref 0.61–1.24)
GFR, Estimated: 41 mL/min — ABNORMAL LOW (ref >60)
Glucose, Bld: 136 mg/dL — ABNORMAL HIGH (ref 70–99)
Potassium: 5.1 mmol/L (ref 3.5–5.1)
Sodium: 135 mmol/L (ref 135–145)
Total Bilirubin: 1.1 mg/dL (ref 0.0–1.2)
Total Protein: 7 g/dL (ref 6.5–8.1)

## 2023-05-25 LAB — LIPASE, BLOOD: Lipase: 32 U/L (ref 11–51)

## 2023-05-25 LAB — TROPONIN I (HIGH SENSITIVITY): Troponin I (High Sensitivity): 10 ng/L (ref <18)

## 2023-05-25 MED ORDER — DICYCLOMINE HCL 10 MG PO CAPS
10.0000 mg | ORAL_CAPSULE | Freq: Once | ORAL | Status: AC
  Administered 2023-05-25: 10 mg via ORAL
  Filled 2023-05-25: qty 1

## 2023-05-25 MED ORDER — LACTATED RINGERS IV BOLUS
1000.0000 mL | Freq: Once | INTRAVENOUS | Status: AC
  Administered 2023-05-25: 1000 mL via INTRAVENOUS

## 2023-05-25 MED ORDER — FAMOTIDINE 20 MG PO TABS: 20.0000 mg | ORAL_TABLET | Freq: Every day | ORAL | 0 refills | Status: AC

## 2023-05-25 MED ORDER — HYDRALAZINE HCL 25 MG PO TABS
100.0000 mg | ORAL_TABLET | Freq: Once | ORAL | Status: AC
  Administered 2023-05-25: 100 mg via ORAL
  Filled 2023-05-25: qty 4

## 2023-05-25 MED ORDER — MORPHINE SULFATE (PF) 4 MG/ML IV SOLN
4.0000 mg | Freq: Once | INTRAVENOUS | Status: AC
  Administered 2023-05-25: 4 mg via INTRAVENOUS
  Filled 2023-05-25: qty 1

## 2023-05-25 MED ORDER — AMLODIPINE BESYLATE 5 MG PO TABS
10.0000 mg | ORAL_TABLET | Freq: Once | ORAL | Status: AC
  Administered 2023-05-25: 10 mg via ORAL
  Filled 2023-05-25: qty 2

## 2023-05-25 MED ORDER — ONDANSETRON HCL 4 MG/2ML IJ SOLN
4.0000 mg | Freq: Once | INTRAMUSCULAR | Status: AC
  Administered 2023-05-25: 4 mg via INTRAVENOUS
  Filled 2023-05-25: qty 2

## 2023-05-25 MED ORDER — DICYCLOMINE HCL 20 MG PO TABS: 20.0000 mg | ORAL_TABLET | Freq: Two times a day (BID) | ORAL | 0 refills | Status: AC

## 2023-05-25 MED ORDER — FAMOTIDINE 20 MG PO TABS
20.0000 mg | ORAL_TABLET | Freq: Once | ORAL | Status: AC
  Administered 2023-05-25: 20 mg via ORAL
  Filled 2023-05-25: qty 1

## 2023-05-25 MED ORDER — CARVEDILOL 12.5 MG PO TABS
25.0000 mg | ORAL_TABLET | Freq: Once | ORAL | Status: AC
  Administered 2023-05-25: 25 mg via ORAL
  Filled 2023-05-25: qty 2

## 2023-05-25 NOTE — ED Triage Notes (Signed)
 PT BIB [facility] from home after PT complained of 10/10 CP. PT was discharged for Pancreatitis 1/11. 324 Aspirin, 4mg  Zofran IM given en route. PT takes adinifil. [facility] vitals HR 90, BP 180/116, CBG 146, Sat 100% room air. AOx4

## 2023-05-25 NOTE — ED Provider Notes (Signed)
 [place] EMERGENCY DEPARTMENT AT [facility] Provider Note   CSN: [id number] Arrival date & time: 05/25/23  1050     History  No chief complaint on file.   Randy R Northrup Jr. is a 43 y.o. male.  Patient is a 43 year old male with a past medical history of hypertension, diabetes and recent admission for pancreatitis presenting to the emergency department with chest pain and abdominal pain.  Patient was discharged yesterday for his pancreatitis and states that he feels like he was discharged early.  He states that he has had continued diffuse abdominal pain as well as some chest pain.  He states that he was having some chest pain while in the hospital however it worsened this morning.  He states that he has been nauseous and had 1 episode of vomiting this morning.  States that he has had some diarrhea.  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  He states that he has not drank any alcohol in 23 years.  He denies any associated shortness of breath or fever.  The history is provided by the patient.       Home Medications Prior to Admission medications   Medication Sig Start Date End Date Taking? Authorizing Provider  dicyclomine  (BENTYL ) 20 MG tablet Take 1 tablet (20 mg total) by mouth 2 (two) times daily. 05/25/23  Yes Ellouise, Mayes Sangiovanni K, DO  famotidine  (PEPCID ) 20 MG tablet Take 1 tablet (20 mg total) by mouth daily. 05/25/23  Yes Ellouise, Marq Rebello K, DO  Accu-Chek Softclix Lancets lancets USE UP TO 4 TIMES DAILY 04/04/23   Tanda Bleacher, MD  amLODipine  (NORVASC ) 10 MG tablet Take 1 tablet (10 mg total) by mouth daily. 11/21/22   Tanda Bleacher, MD  atorvastatin  (LIPITOR ) 80 MG tablet Take 1 tablet (80 mg total) by mouth daily. 11/21/22   Tanda Bleacher, MD  blood glucose meter kit and supplies KIT Dispense based on patient and insurance preference. Use up to four times daily as directed. 04/04/23   Tanda Bleacher, MD  carvedilol   (COREG ) 25 MG tablet Take 1 tablet (25 mg total) by mouth 2 (two) times daily with a meal. 11/21/22   Tanda Bleacher, MD  Continuous Glucose Sensor (FREESTYLE LIBRE 2 SENSOR) MISC Use to check blood glucose continuously. Change sensors once every 14 days. 07/18/22   Newlin, Enobong, MD  Glucose Blood (BLOOD GLUCOSE TEST STRIPS 333) STRP Utilize to check blood sugar TID 04/04/23   Tanda Bleacher, MD  glucose blood (TRUE METRIX BLOOD GLUCOSE TEST) test strip USE UP TO FOUR TIMES DAILY AS DIRECTED 04/04/23   Tanda Bleacher, MD  hydrALAZINE  (APRESOLINE ) 100 MG tablet Take 1 tablet (100 mg total) by mouth 2 (two) times daily. 03/04/23   Tanda Bleacher, MD  insulin  aspart (NOVOLOG ) 100 UNIT/ML FlexPen Inject 10 Units into the skin 3 (three) times daily with meals. Patient taking differently: Inject 0-10 Units into the skin 3 (three) times daily with meals. 10/25/22   Danford, Lonni SQUIBB, MD  Insulin  Glargine (BASAGLAR  KWIKPEN) 100 UNIT/ML Inject 34 Units into the skin daily. Patient taking differently: Inject 30 Units into the skin at bedtime as needed (high BS). 10/25/22   Danford, Lonni SQUIBB, MD  Insulin  Pen Needle (TRUEPLUS 5-BEVEL PEN NEEDLES) 32G X 4 MM MISC Use to inject Basaglar  once daily and Novolog  three times daily. Max: 4 pen needles daily. 11/12/22   Newlin, Enobong, MD  ondansetron  (ZOFRAN ) 4 MG  tablet Take 1 tablet (4 mg total) by mouth every 8 (eight) hours as needed for nausea or vomiting. 05/23/23   Christobal Guadalajara, MD  pantoprazole  (PROTONIX ) 40 MG tablet Take 1 tablet (40 mg total) by mouth 2 (two) times daily. Patient taking differently: Take 40 mg by mouth daily as needed (heartburn). 11/21/22   Tanda Bleacher, MD  sildenafil  (VIAGRA ) 100 MG tablet Take 0.5-1 tablets (50-100 mg total) by mouth daily as needed for erectile dysfunction. Patient not taking: Reported on 05/21/2023 11/21/22   Tanda Bleacher, MD      Allergies    Tomato    Review of Systems   Review of Systems  Physical  Exam Updated Vital Signs BP (!) 191/108   Pulse 88   Temp 98.1 F (36.7 C) (Oral)   Resp 15   Ht 5' 8 (1.727 m)   Wt 90.7 kg   SpO2 100%   BMI 30.41 kg/m  Physical Exam Vitals and nursing note reviewed.  Constitutional:      General: He is not in acute distress.    Appearance: Normal appearance.  HENT:     Head: Normocephalic and atraumatic.     Nose: Nose normal.     Mouth/Throat:     Mouth: Mucous membranes are moist.  Eyes:     Extraocular Movements: Extraocular movements intact.     Conjunctiva/sclera: Conjunctivae normal.  Cardiovascular:     Rate and Rhythm: Normal rate and regular rhythm.     Heart sounds: Normal heart sounds.  Pulmonary:     Effort: Pulmonary effort is normal.     Breath sounds: Normal breath sounds.  Abdominal:     General: Abdomen is flat.     Palpations: Abdomen is soft.     Tenderness: There is abdominal tenderness (mild diffuse). There is no guarding or rebound.  Musculoskeletal:        General: Normal range of motion.     Cervical back: Normal range of motion.  Skin:    General: Skin is warm and dry.  Neurological:     General: No focal deficit present.     Mental Status: He is alert and oriented to person, place, and time.  Psychiatric:        Mood and Affect: Mood normal.     ED Results / Procedures / Treatments   Labs (all labs ordered are listed, but only abnormal results are displayed) Labs Reviewed  COMPREHENSIVE METABOLIC PANEL - Abnormal; Notable for the following components:      Result Value   CO2 20 (*)    Glucose, Bld 136 (*)    Creatinine, Ser 2.03 (*)    GFR, Estimated 41 (*)    All other components within normal limits  CBC WITH DIFFERENTIAL/PLATELET - Abnormal; Notable for the following components:   RBC 4.12 (*)    Hemoglobin 11.3 (*)    HCT 35.4 (*)    All other components within normal limits  LIPASE, BLOOD  TROPONIN I (HIGH SENSITIVITY)    EKG EKG Interpretation Date/Time:  Sunday May 25 2023  11:00:12 EST Ventricular Rate:  91 PR Interval:  169 QRS Duration:  86 QT Interval:  339 QTC Calculation: 417 R Axis:   68  Text Interpretation: Sinus rhythm Consider left ventricular hypertrophy No significant change since last tracing Confirmed by Ellouise Fine ([id number]) on 05/25/2023 11:04:19 AM  Radiology DG Chest Port 1 View Result Date: 05/25/2023 CLINICAL DATA:  Chest pain EXAM: PORTABLE CHEST 1 VIEW COMPARISON:  01/29/2023 FINDINGS: The heart size and mediastinal contours are within normal limits. Both lungs are clear. The visualized skeletal structures are unremarkable. IMPRESSION: No active disease. Electronically Signed   By: Mabel Converse D.O.   On: 05/25/2023 12:24    Procedures Procedures    Medications Ordered in ED Medications  lactated ringers  bolus 1,000 mL (1,000 mLs Intravenous New Bag/Given 05/25/23 1203)  morphine  (PF) 4 MG/ML injection 4 mg (4 mg Intravenous Given 05/25/23 1201)  ondansetron  (ZOFRAN ) injection 4 mg (4 mg Intravenous Given 05/25/23 1201)  dicyclomine  (BENTYL ) capsule 10 mg (10 mg Oral Given 05/25/23 1452)  famotidine  (PEPCID ) tablet 20 mg (20 mg Oral Given 05/25/23 1449)  amLODipine  (NORVASC ) tablet 10 mg (10 mg Oral Given 05/25/23 1449)  carvedilol  (COREG ) tablet 25 mg (25 mg Oral Given 05/25/23 1451)  hydrALAZINE  (APRESOLINE ) tablet 100 mg (100 mg Oral Given 05/25/23 1450)    ED Course/ Medical Decision Making/ A&P Clinical Course as of 05/25/23 1455  Sun May 25, 2023  1343 Chest pain ongoing for several days so single troponin is sufficient. Mild increased Cr from time of discharge, lipase normal no signs of pancreatitis. [VK]  1446 On reassessment, the patient still is complaining of some abdominal discomfort but has had no further vomiting.  The patient will be given Pepcid  and Bentyl  as well as his home blood pressure medication as he is persistently hypertensive.  He will be given a p.o. challenge and is otherwise stable for discharge home  with outpatient follow-up. [VK]    Clinical Course User Index [VK] Kingsley, Jamiracle Avants K, DO                                 Medical Decision Making This patient presents to the ED with chief complaint(s) of abdominal pain, chest pain with pertinent past medical history of hypertension, diabetes, CKD which further complicates the presenting complaint. The complaint involves an extensive differential diagnosis and also carries with it a high risk of complications and morbidity.    The differential diagnosis includes dehydration, electrolyte abnormality, pancreatitis, hepatitis, cannabinoid hyperemesis, ACS, arrhythmia, anemia, pneumonia, pneumothorax, pulmonary edema, pleural effusion, gastroenteritis, viral syndrome  Additional history obtained: Additional history obtained from N/A Records reviewed previous admission documents  ED Course and Reassessment: On patient's arrival he is hypertensive but otherwise hemodynamically stable in no acute distress.  EKG on arrival showed normal sinus rhythm without acute ischemic changes.  Patient will have labs performed including troponin and lipase and will be started on fluids with nausea and pain control and will be closely reassessed.  Independent labs interpretation:  The following labs were independently interpreted: mild increased Cr 2.0 from 1.7, otherwise labs at baseline  Independent visualization of imaging: - I independently visualized the following imaging with scope of interpretation limited to determining acute life threatening conditions related to emergency care: CXR, which revealed no acute disease  Consultation: - Consulted or discussed management/test interpretation w/ external professional: N/A  Consideration for admission or further workup: Patient has no emergent conditions requiring admission or further work-up at this time and is stable for discharge home with primary care follow-up  Social Determinants of health:  N/A    Amount and/or Complexity of Data Reviewed Labs: ordered. Radiology: ordered.  Risk Prescription drug management.          Final Clinical Impression(s) / ED Diagnoses Final diagnoses:  Nausea and vomiting, unspecified vomiting type  Epigastric pain  AKI (acute kidney injury) (HCC)  Hypertension, unspecified type    Rx / DC Orders ED Discharge Orders          Ordered    famotidine  (PEPCID ) 20 MG tablet  Daily        05/25/23 1447    dicyclomine  (BENTYL ) 20 MG tablet  2 times daily        05/25/23 1447              Kingsley, Redmond Whittley K, DO 05/25/23 1455

## 2023-05-25 NOTE — Discharge Instructions (Addendum)
 You were seen in the emergency department for nausea, vomiting and abdominal pain.  You had no signs of pancreatitis or new infection.  You your kidney function was mildly increased from when you were discharged with some mild dehydration.  We gave you fluids in the emergency department.  You may have some inflammation in your stomach called gastritis and I have given you an antacid that you should take daily for at least the next 1 to 2 weeks to see if this helps with your symptoms and can take the Zofran  that you were prescribed as needed for nausea.  You can take Bentyl  and Tylenol  as needed for pain.  You should follow-up with your primary doctor in the next few days to have your symptoms and your kidney function rechecked as well as your blood pressure as it was high in the emergency department.  You should return to the emergency department for significantly worsening pain, repetitive vomiting despite your nausea medicine or any other new or concerning symptoms.

## 2023-05-26 ENCOUNTER — Telehealth: Payer: Self-pay

## 2023-05-26 NOTE — Transitions of Care (Post Inpatient/ED Visit) (Signed)
   05/26/2023  Name: Randy R Hipwell Jr. MRN: [id number] DOB: March 01, 1981  Today's TOC FU Call Status: Today's TOC FU Call Status:: Unsuccessful Call (1st Attempt) Unsuccessful Call (1st Attempt) Date: 05/26/23  Attempted to reach the patient regarding the most recent Inpatient/ED visit.  Follow Up Plan: Additional outreach attempts will be made to reach the patient to complete the Transitions of Care (Post Inpatient/ED visit) call.   Signature Slater Diesel, RN

## 2023-05-27 ENCOUNTER — Telehealth: Payer: Self-pay

## 2023-05-27 ENCOUNTER — Other Ambulatory Visit: Payer: Self-pay

## 2023-05-27 NOTE — Transitions of Care (Post Inpatient/ED Visit) (Signed)
   05/27/2023  Name: Randy R Wilmot Jr. MRN: [id number] DOB: February 01, 1981  Today's TOC FU Call Status: Today's TOC FU Call Status:: Unsuccessful Call (2nd Attempt) Unsuccessful Call (1st Attempt) Date: 05/26/23 Unsuccessful Call (2nd Attempt) Date: 05/27/23  Attempted to reach the patient regarding the most recent Inpatient/ED visit.  Follow Up Plan: Additional outreach attempts will be made to reach the patient to complete the Transitions of Care (Post Inpatient/ED visit) call.   Signature Slater Diesel, RN

## 2023-05-28 ENCOUNTER — Telehealth: Payer: Self-pay

## 2023-05-28 ENCOUNTER — Other Ambulatory Visit: Payer: Self-pay

## 2023-05-28 NOTE — Transitions of Care (Post Inpatient/ED Visit) (Signed)
   05/28/2023  Name: Randy R Perlstein Jr. MRN: [id number] DOB: [birth year]  Today's TOC FU Call Status: Today's TOC FU Call Status:: Unsuccessful Call (3rd Attempt) Unsuccessful Call (1st Attempt) Date: 05/26/23 Unsuccessful Call (2nd Attempt) Date: 05/27/23 Unsuccessful Call (3rd Attempt) Date: 05/28/23  Attempted to reach the patient regarding the most recent Inpatient/ED visit.  Follow Up Plan: No further outreach attempts will be made at this time. We have been unable to contact the patient.  Letter sent to patient requesting he contact [facility] to schedule a follow up appointment as we have not been able to reach him   Signature Burnett Carson, RN

## 2023-05-30 ENCOUNTER — Other Ambulatory Visit: Payer: Self-pay

## 2023-06-02 ENCOUNTER — Other Ambulatory Visit: Payer: Self-pay

## 2023-06-02 ENCOUNTER — Other Ambulatory Visit: Payer: Self-pay | Admitting: Pharmacist

## 2023-06-02 ENCOUNTER — Other Ambulatory Visit: Payer: Self-pay | Admitting: Family Medicine

## 2023-06-02 DIAGNOSIS — E119 Type 2 diabetes mellitus without complications: Secondary | ICD-10-CM

## 2023-06-02 DIAGNOSIS — E1165 Type 2 diabetes mellitus with hyperglycemia: Secondary | ICD-10-CM

## 2023-06-02 MED ORDER — FREESTYLE LIBRE 3 PLUS SENSOR MISC
6 refills | Status: DC
  Filled 2023-06-02: qty 2, 30d supply, fill #0
  Filled 2023-07-11 – 2023-07-18 (×2): qty 2, 30d supply, fill #1
  Filled 2023-08-11: qty 2, 30d supply, fill #2
  Filled 2023-08-25 (×2): qty 2, 30d supply, fill #3

## 2023-06-02 MED ORDER — ACCU-CHEK SOFTCLIX LANCETS MISC: 6 refills | Status: DC

## 2023-06-02 MED ORDER — FREESTYLE LIBRE 3 READER DEVI
0 refills | Status: DC
  Filled 2023-06-02: qty 1, 365d supply, fill #0

## 2023-06-02 MED ORDER — LANTUS SOLOSTAR 100 UNIT/ML ~~LOC~~ SOPN
34.0000 [IU] | PEN_INJECTOR | Freq: Every day | SUBCUTANEOUS | 2 refills | Status: DC
  Filled 2023-06-02: qty 15, 44d supply, fill #0
  Filled 2023-08-25 – 2023-10-01 (×2): qty 15, 44d supply, fill #1

## 2023-06-03 ENCOUNTER — Other Ambulatory Visit: Payer: Self-pay

## 2023-06-03 ENCOUNTER — Ambulatory Visit: Payer: 59 | Attending: Internal Medicine | Admitting: Internal Medicine

## 2023-06-05 ENCOUNTER — Other Ambulatory Visit: Payer: Self-pay

## 2023-06-05 ENCOUNTER — Telehealth: Payer: Self-pay | Admitting: Family Medicine

## 2023-06-05 NOTE — Telephone Encounter (Signed)
Copied from CRM [id number]. Topic: General - Other >> Jun 05, 2023 12:53 PM Suzette B wrote: Reason for CRM: Patient called in and asked me to transfer him directly to a nurse, I asked the patient was it anything I could help him with any information he could give me so that I can inform the nurse of why I was transferring him.  He stated it was a prescription, I proceeded to ask him which prescription he stated one he needed refilled I advised the patient I could assist him with that the patient refused to give any information, patient became hostile and asked why was he able to speak with a nurse last week if he just called I advised this was new protocol and if he needed his prescription filled I could submit the request and leave a message for the nurse to call, patient stated he was headed to the office to speak with someone and disconnected the call.

## 2023-06-05 NOTE — Telephone Encounter (Signed)
Noted  

## 2023-06-05 NOTE — Telephone Encounter (Signed)
I called patient back and no one answered.  I left a message on voicemail to return my call.

## 2023-06-06 NOTE — Telephone Encounter (Signed)
I called patient someone pick up the phone and did not say anything.  I was unable to leave voicemail.

## 2023-06-09 ENCOUNTER — Other Ambulatory Visit: Payer: Self-pay

## 2023-06-09 ENCOUNTER — Other Ambulatory Visit: Payer: Self-pay | Admitting: *Deleted

## 2023-06-09 DIAGNOSIS — E1165 Type 2 diabetes mellitus with hyperglycemia: Secondary | ICD-10-CM

## 2023-06-09 MED ORDER — FREESTYLE LIBRE 3 PLUS SENSOR MISC: 6 refills | Status: DC

## 2023-06-09 NOTE — Telephone Encounter (Signed)
Rx has been sent to pharmacy and patient is aware.

## 2023-06-16 ENCOUNTER — Other Ambulatory Visit: Payer: Self-pay

## 2023-06-24 ENCOUNTER — Telehealth: Payer: Self-pay | Admitting: Family Medicine

## 2023-06-24 NOTE — Telephone Encounter (Signed)
Patient was identified as falling into the [place] Measure - Diabetes.   Patient was: Appointment scheduled with primary care provider in the next 30 days.  Scheduled on 06/27/23

## 2023-06-25 ENCOUNTER — Other Ambulatory Visit: Payer: Self-pay | Admitting: Family Medicine

## 2023-06-27 ENCOUNTER — Encounter: Payer: 59 | Admitting: Family Medicine

## 2023-06-27 ENCOUNTER — Encounter: Payer: Self-pay | Admitting: Family Medicine

## 2023-07-01 ENCOUNTER — Other Ambulatory Visit: Payer: Self-pay

## 2023-07-03 ENCOUNTER — Other Ambulatory Visit: Payer: Self-pay | Admitting: Family Medicine

## 2023-07-08 ENCOUNTER — Other Ambulatory Visit: Payer: Self-pay

## 2023-07-08 ENCOUNTER — Other Ambulatory Visit: Payer: Self-pay | Admitting: Family Medicine

## 2023-07-08 DIAGNOSIS — E1165 Type 2 diabetes mellitus with hyperglycemia: Secondary | ICD-10-CM

## 2023-07-09 NOTE — Telephone Encounter (Signed)
 Requested medication (s) are due for refill today: Yes  Requested medication (s) are on the active medication list: Yes  Last refill:  06/02/23  Future visit scheduled: Yes  Notes to clinic:  Unable to refill due to no refill protocol for this medication.      Requested Prescriptions  Pending Prescriptions Disp Refills   Continuous Glucose Sensor (FREESTYLE LIBRE 3 PLUS SENSOR) MISC 2 each 6    Sig: Change sensor every 15 days. Use to check blood sugar continuously.     There is no refill protocol information for this order

## 2023-07-11 ENCOUNTER — Other Ambulatory Visit: Payer: Self-pay

## 2023-07-15 ENCOUNTER — Other Ambulatory Visit: Payer: Self-pay

## 2023-07-18 ENCOUNTER — Other Ambulatory Visit: Payer: Self-pay

## 2023-07-22 ENCOUNTER — Other Ambulatory Visit: Payer: Self-pay | Admitting: Family Medicine

## 2023-07-31 ENCOUNTER — Encounter: Payer: 59 | Admitting: Family Medicine

## 2023-08-08 ENCOUNTER — Other Ambulatory Visit: Payer: Self-pay | Admitting: Family Medicine

## 2023-08-11 ENCOUNTER — Other Ambulatory Visit (HOSPITAL_COMMUNITY): Payer: Self-pay

## 2023-08-11 ENCOUNTER — Other Ambulatory Visit: Payer: Self-pay

## 2023-08-13 ENCOUNTER — Other Ambulatory Visit: Payer: Self-pay

## 2023-08-20 ENCOUNTER — Other Ambulatory Visit: Payer: Self-pay

## 2023-08-25 ENCOUNTER — Other Ambulatory Visit: Payer: Self-pay

## 2023-08-25 ENCOUNTER — Telehealth: Payer: Self-pay

## 2023-08-25 ENCOUNTER — Other Ambulatory Visit (HOSPITAL_COMMUNITY): Payer: Self-pay

## 2023-08-25 NOTE — Telephone Encounter (Signed)
 Copied from CRM [id number]. Topic: General - Other >> Aug 25, 2023  9:42 AM Randy [name] wrote: Reason for CRM: Pharmacy is calling regarding PA for  Continuous Glucose Sensor (FREESTYLE LIBRE 3 PLUS SENSOR) MISC [phone] Continuous Glucose Sensor (FREESTYLE LIBRE 3 PLUS SENSOR) MISC [id number]. Pharmacy states that PA was originally sent 08/08/23 but was canceled. Needing PA so that the secondary insurance, Medicaid will pay. Please advise

## 2023-08-25 NOTE — Telephone Encounter (Signed)
 Thanks

## 2023-08-25 NOTE — Telephone Encounter (Signed)
 Copied from CRM [id number]. Topic: Clinical - Prescription Issue >> Aug 25, 2023 11:11 AM Randy [name] wrote: Reason for CRM: Patient said he placed an order today for a Glucose Sensor(Freestyle Libre 3) and he wants to cancel it. I told the patient I do not see show a pending order. [place] CAL didn't show an order. Please contact the patient at [phone]

## 2023-08-25 NOTE — Telephone Encounter (Signed)
Please see previous notes.

## 2023-08-25 NOTE — Telephone Encounter (Signed)
 Spoke with PA coordinator and [place] is not in the process of being filled. His primary ins, [facility], does not cover Jerrilyn Moras so Medicaid will not cover as secondary billing (in case this info is needed).  They submitted a PA request to [facility] for Dexcom CGM to see if they will cover this and have not heard back yet.

## 2023-08-27 ENCOUNTER — Other Ambulatory Visit: Payer: Self-pay

## 2023-08-27 ENCOUNTER — Other Ambulatory Visit: Payer: Self-pay | Admitting: Pharmacist

## 2023-08-27 MED ORDER — DEXCOM G7 SENSOR MISC
6 refills | Status: DC
  Filled 2023-08-27: qty 3, 30d supply, fill #0

## 2023-08-27 MED ORDER — DEXCOM G7 RECEIVER DEVI
0 refills | Status: DC
  Filled 2023-08-27: qty 1, 365d supply, fill #0

## 2023-08-29 ENCOUNTER — Ambulatory Visit: Payer: Self-pay

## 2023-08-29 ENCOUNTER — Telehealth: Payer: Self-pay | Admitting: Family Medicine

## 2023-08-29 NOTE — Telephone Encounter (Signed)
 Copied from CRM [id number]. Topic: Clinical - Medication Question >> Aug 29, 2023  2:58 PM Bridgette Campus T wrote: Reason for CRM: Need test strips for new glucose monitoring machine, Viva Plus test strips- [phone]- unable to test sugar

## 2023-08-29 NOTE — Telephone Encounter (Signed)
 Chief Complaint: symptoms of hyperglycemia Symptoms: jittery, frequent urination Frequency: 3 days Pertinent Negatives: Patient denies fever, abd pain, nausea, vomiting, CP, SOB, dizziness, blurry vision Disposition: [x] ED /[] Urgent Care (no appt availability in office) / [] Appointment(In office/virtual)/ []  [place] [facility]/ [] Home Care/ [x] Refused Recommended Disposition /[]  Mobile Bus/ []  Follow-up with PCP Additional Notes: Pt reports he thinks his sugar is high. Pt states his freestyle libre device was not working, so removed it and was not able to check his sugars. Pt asked his son to buy a glucometer but the pt does not have test strips that work with the new glucometer. Pt reports he feels jittery and like his sugar is high. Pt reports frequent urination. Pt has not checked his BG in 2-3 days and it was 260 at that time. Pt has not been taking Lantus  and is taking 5-6 units of Novolog  in the AM and PM while he has not been able to check his sugar. RN advised pt he needs to go to the ED. Pt declined. RN advised the pt that the ED is the best place for him in case he needs IV fluids or IV insulin . Pt verbalized understanding. RN advised the pt that if he won't go to the ED, he should at least try to go to [facility]. Pt states he will go if he gets worse. In the meantime pt is requesting a prescription for Aviva Plus test strips so he can check his sugar with his new glucometer. Pt was told by the pharmacy that he can only get those with a prescription.     Reason for Disposition  [1] Symptoms of high blood sugar (e.g., abnormally thirsty, frequent urination, weight loss) AND [2] not able to test blood glucose  Answer Assessment - Initial Assessment Questions 1. BLOOD GLUCOSE: What is your blood glucose level?      When his freestyle herlene was working, BG would go up to 300-something, he'd take insulin  and it would go to 90-100 2. ONSET: When did you check the blood glucose?      2-3 days ago, 260 at that time 3. USUAL RANGE: What is your glucose level usually? (e.g., usual fasting morning value, usual evening value)     Anywhere from 90-300 4. KETONES: Do you check for ketones (urine or blood test strips)? If Yes, ask: What does the test show now?      Does not do that  5. TYPE 1 or 2:  Do you know what type of diabetes you have?  (e.g., Type 1, Type 2, Gestational; doesn't know)      Type 2 DM 6. INSULIN : Do you take insulin ? What type of insulin (s) do you use? What is the mode of delivery? (syringe, pen; injection or pump)?      Pt states he wakes up,eats breakfast, takes 5-6 units of novolog  and wing it the rest of the way. States he takes 5 units of novolog  at 1900 and stays up to see how he feels.  7. DIABETES PILLS: Do you take any pills for your diabetes? If Yes, ask: Have you missed taking any pills recently?     No 8. OTHER SYMPTOMS: Do you have any symptoms? (e.g., fever, frequent urination, difficulty breathing, dizziness, weakness, vomiting)     Jittery, pt states he is urinating a bit more than he normally would. Denies fever, difficulty breathing, dizziness, weakness, vomiting, nausea, abd pain today. Pt states he ate something 'that didn't agree with him' yesterday and  he vomited, none today. I can tell I need to check my sugar. Pt states his freestyle herlene was not reading so he removed it.  Protocols used: Diabetes - High Blood Sugar-A-AH

## 2023-09-01 ENCOUNTER — Other Ambulatory Visit: Payer: Self-pay | Admitting: Family Medicine

## 2023-09-01 ENCOUNTER — Other Ambulatory Visit: Payer: Self-pay

## 2023-09-01 ENCOUNTER — Telehealth: Payer: Self-pay | Admitting: Family Medicine

## 2023-09-01 NOTE — Telephone Encounter (Signed)
 Copied from CRM [id number]. Topic: General - Other >> Sep 01, 2023 11:32 AM Alpha Arts wrote: Reason for CRM: Patient is requesting a callback to speak about getting Aviva test strips sent to preferred pharmacy.   Callback #: [phone]  Preferred Pharmacy:  [facility] [address] ([place]), [place] - [address] [place] ([place]) [place] [address] Phone: [phone] Fax: [phone] Hours: Not open 24 hours

## 2023-09-01 NOTE — Telephone Encounter (Signed)
 Copied from CRM [id number]. Topic: Clinical - Medication Refill >> Sep 01, 2023  3:53 PM Carlatta H wrote: Most Recent Primary Care Visit:  Provider: Abraham Abo  Department: [facility]  Visit Type: OFFICE VISIT  Date: 04/04/2023  Medication: Glucose Blood (BLOOD GLUCOSE TEST STRIPS 333) STRP [[id number]]  Has the patient contacted their pharmacy? No (Agent: If no, request that the patient contact the pharmacy for the refill. If patient does not wish to contact the pharmacy document the reason why and proceed with request.) (Agent: If yes, when and what did the pharmacy advise?)  Is this the correct pharmacy for this prescription? Yes If no, delete pharmacy and type the correct one.  This is the patient's preferred pharmacy:  [id number] - [place] ([place]), [place] - [address] [place] ([place]) [place] [address] Phone: [phone] Fax: [phone]     Has the prescription been filled recently? No  Is the patient out of the medication? Yes  Has the patient been seen for an appointment in the last year OR does the patient have an upcoming appointment? Yes  Can we respond through MyChart? No  Agent: Please be advised that Rx refills may take up to 3 business days. We ask that you follow-up with your pharmacy.

## 2023-09-01 NOTE — Telephone Encounter (Signed)
 Patient would like a prescription sent in for these test strips as soon as possible so he can check his blood sugar

## 2023-09-02 ENCOUNTER — Other Ambulatory Visit: Payer: Self-pay

## 2023-09-02 MED ORDER — TRUE METRIX BLOOD GLUCOSE TEST VI STRP: ORAL_STRIP | 1 refills | Status: DC

## 2023-09-02 NOTE — Telephone Encounter (Signed)
 Requested Prescriptions  Pending Prescriptions Disp Refills   glucose blood (TRUE METRIX BLOOD GLUCOSE TEST) test strip 400 each 1    Sig: USE UP TO FOUR TIMES DAILY AS DIRECTED     Endocrinology: Diabetes - Testing Supplies Passed - 09/02/2023  2:05 PM      Passed - Valid encounter within last 12 months    Recent Outpatient Visits           5 months ago Uncontrolled hypertension   [place] [facility], MD   6 months ago Uncontrolled hypertension   [place] [facility], MD   7 months ago Essential hypertension   [place] [facility], MD   7 months ago Uncontrolled hypertension   [place] [facility], MD   9 months ago Preop examination    [facility], MD

## 2023-09-03 ENCOUNTER — Other Ambulatory Visit: Payer: Self-pay | Admitting: *Deleted

## 2023-09-03 ENCOUNTER — Telehealth (INDEPENDENT_AMBULATORY_CARE_PROVIDER_SITE_OTHER): Payer: Self-pay | Admitting: Primary Care

## 2023-09-03 DIAGNOSIS — Z794 Long term (current) use of insulin: Secondary | ICD-10-CM

## 2023-09-03 MED ORDER — ACCU-CHEK AVIVA PLUS VI STRP: ORAL_STRIP | 12 refills | Status: DC

## 2023-09-03 NOTE — Telephone Encounter (Signed)
 Called pt to confirm appt. Pt will be present.

## 2023-09-03 NOTE — Telephone Encounter (Signed)
 Patient is aware that strips has been sent in  to pharmacy

## 2023-09-04 ENCOUNTER — Other Ambulatory Visit: Payer: Self-pay

## 2023-09-04 ENCOUNTER — Encounter: Payer: Self-pay | Admitting: Family Medicine

## 2023-09-04 ENCOUNTER — Ambulatory Visit (INDEPENDENT_AMBULATORY_CARE_PROVIDER_SITE_OTHER): Admitting: Family Medicine

## 2023-09-04 VITALS — BP 161/100 | HR 70 | Ht 68.0 in | Wt 199.8 lb

## 2023-09-04 DIAGNOSIS — E1165 Type 2 diabetes mellitus with hyperglycemia: Secondary | ICD-10-CM | POA: Diagnosis not present

## 2023-09-04 DIAGNOSIS — Z794 Long term (current) use of insulin: Secondary | ICD-10-CM | POA: Diagnosis not present

## 2023-09-04 DIAGNOSIS — H547 Unspecified visual loss: Secondary | ICD-10-CM | POA: Diagnosis not present

## 2023-09-04 DIAGNOSIS — I10 Essential (primary) hypertension: Secondary | ICD-10-CM

## 2023-09-04 LAB — POCT GLYCOSYLATED HEMOGLOBIN (HGB A1C): HbA1c, POC (controlled diabetic range): 10.5 % — AB (ref 0.0–7.0)

## 2023-09-04 MED ORDER — AMLODIPINE BESYLATE 10 MG PO TABS: 10.0000 mg | ORAL_TABLET | Freq: Every day | ORAL | 1 refills | Status: DC

## 2023-09-04 MED ORDER — HYDRALAZINE HCL 100 MG PO TABS: 100.0000 mg | ORAL_TABLET | Freq: Two times a day (BID) | ORAL | 1 refills | Status: DC

## 2023-09-04 MED ORDER — CARVEDILOL 25 MG PO TABS: 25.0000 mg | ORAL_TABLET | Freq: Two times a day (BID) | ORAL | 1 refills | Status: DC

## 2023-09-04 MED ORDER — LISINOPRIL 20 MG PO TABS: 20.0000 mg | ORAL_TABLET | Freq: Every day | ORAL | 1 refills | Status: DC
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
## 2023-09-05 ENCOUNTER — Encounter: Payer: Self-pay | Admitting: Family Medicine

## 2023-09-10 ENCOUNTER — Telehealth: Payer: Self-pay

## 2023-09-10 ENCOUNTER — Encounter (INDEPENDENT_AMBULATORY_CARE_PROVIDER_SITE_OTHER): Admitting: Primary Care

## 2023-09-10 DIAGNOSIS — Z794 Long term (current) use of insulin: Secondary | ICD-10-CM

## 2023-09-10 NOTE — Telephone Encounter (Signed)
 CALLED PT TO HELP HIM WITH CANCELING HIS OSCAR CARE PLAN. PLAN IS NOW TERMED EFFECTIVE 09/10/2023. ALSO ASSISTED PT WITH GETTING A VACATION OVERRIDE ON HIS TEST STRIPS AND SENSORS. PT WILL BE OUT OF TOWN FOR 2-3 MONTHS.

## 2023-09-10 NOTE — Telephone Encounter (Addendum)
 Referral received for home health care- SN, HHA.  I called the patient to inquire if he has a preference for home health agencies and he did not. I then explained that I have 10 agencies I can check with but there is no guarantee that they will accept the referral. He said he is really just looking for an aide, to assist with personal care.  I told him that if Dr Elvan Hamel is in agreement, I can place a PCS request to [facility].  They will have a nurse come out to assess his needs and determine if he would qualify for services. I explained that the focus of the services is on personal care, not housekeeping and he said he understood.  He said to disregard the request for home nursing and place the referral for PCS.   I also informed him that his insurance status still indicates he has [employer]. He said he thought that was terminated. I told him that I can refer him to our financial counselor for assistance with contacting [facility] and he was in agreement He does not need [facility], now that he has been approved for Medicaid.  Message sent to Josselin Speas, Artist.  He is also interested in a gym membership, especially one with water therapy .  He wanted to know if he has a benefit for this and I explained that I will refer him to [facility] for assistance with accessing any extra benefits that he is eligible for from [facility] and he was in agreement to placing that referral too.   [facility] referral placed.

## 2023-09-11 ENCOUNTER — Other Ambulatory Visit: Payer: Self-pay

## 2023-09-11 ENCOUNTER — Telehealth: Payer: Self-pay | Admitting: Family Medicine

## 2023-09-11 ENCOUNTER — Other Ambulatory Visit: Payer: Self-pay | Admitting: Family Medicine

## 2023-09-11 DIAGNOSIS — Z794 Long term (current) use of insulin: Secondary | ICD-10-CM

## 2023-09-11 MED ORDER — ACCU-CHEK AVIVA PLUS VI STRP: ORAL_STRIP | 0 refills | Status: DC

## 2023-09-11 MED ORDER — DEXCOM G7 SENSOR MISC: 0 refills | Status: DC

## 2023-09-11 NOTE — Telephone Encounter (Signed)
 Patient is aware rx will be written by provider for 3 month supply

## 2023-09-11 NOTE — Telephone Encounter (Signed)
 Copied from CRM [id number]. Topic: Clinical - Medication Question >> Sep 11, 2023  9:30 AM Lizabeth Riggs wrote: Reason for CRM:  This message is for Josselin Speas: Dariusz would like for Josselin to call him back about getting a vacation override on his test strips and sensors. He will be out of town for 2-3 months. Please call him at [phone]. Thanks >> Sep 11, 2023 10:07 AM Lizabeth Riggs wrote: Charbel is leaving around 5 PM today and needs medication issue resolved before lunch today. Please call him as soon as possible. Thanks

## 2023-09-11 NOTE — Telephone Encounter (Signed)
 Is there a way we can get him extra test strips

## 2023-09-11 NOTE — Telephone Encounter (Signed)
 Patient is calling back upset requesting Joselyn to call him back.. Patient is requeting Joselyn to call his insurance to get an "override." Patient reports that he is running out of time. And hung up on the agent.

## 2023-09-11 NOTE — Telephone Encounter (Signed)
 Copied from CRM [id number]. Topic: Clinical - Medication Question >> Sep 11, 2023  9:30 AM Lizabeth Riggs wrote: Reason for CRM:  This message is for Josselin Speas: Daion would like for Josselin to call him back about getting a vacation override on his test strips and sensors. He will be out of town for 2-3 months. Please call him at [phone]. Thanks

## 2023-09-12 ENCOUNTER — Telehealth: Payer: Self-pay

## 2023-09-12 NOTE — Telephone Encounter (Signed)
 Called patient and left [employer] issue has been fixed [employer] is no longer on file only [place] [employer]

## 2023-09-12 NOTE — Telephone Encounter (Signed)
 Copied from CRM [id number]. Topic: Clinical - Prescription Issue >> Sep 12, 2023  9:47 AM Randy [name] wrote: Reason for CRM: Patient is calling in because he says he is having issues getting his medication. Patient says he had two insurances and cancelled his [name] but [employer] won't cover it due to [name] still showing up. Patient says when he reached out to [name] he was told they couldn't speak to him because he wasn't the provider. Patient says he cancelled his flight because he doesn't want to go out of town without his medication. Patient is requesting to speak with his provider or her nurse regarding this matter. Please follow up with patient.     Called Patient and he needs [name] taken off chart, I will work with front desk to get it taken off and will call patient again . He is aware of thsi as well

## 2023-09-15 ENCOUNTER — Telehealth: Payer: Self-pay | Admitting: Family Medicine

## 2023-09-15 NOTE — Telephone Encounter (Signed)
 Please Advise. Copied from CRM [id number]. Topic: General - Other  >> Sep 15, 2023 10:12 AM Randy [name] wrote: Reason for CRM: PT CALLING TO RETURN CALL TO FINANCIAL COUNSELOR

## 2023-09-16 NOTE — Telephone Encounter (Signed)
 Signed PCS request efaxed to [employer]

## 2023-09-16 NOTE — Telephone Encounter (Signed)
 Paperwork printed out and given to provider

## 2023-09-19 ENCOUNTER — Telehealth: Payer: Self-pay | Admitting: *Deleted

## 2023-09-19 ENCOUNTER — Telehealth: Payer: Self-pay | Admitting: Family Medicine

## 2023-09-19 NOTE — Telephone Encounter (Signed)
 Patient requested a call back from you. Please see notes below, if you need me to do anything don't hesitate to teams me.

## 2023-09-19 NOTE — Telephone Encounter (Unsigned)
 Copied from CRM [id number]. Topic: General - Other >> Sep 19, 2023 11:43 AM Randy [name] J wrote: Reason for CRM: Pt wants  to know if Joesline S. Is available, in regards to a vacation override? He is requesting a callback, please advise Callback # [phone]

## 2023-09-19 NOTE — Progress Notes (Unsigned)
 Complex Care Management Note Care Guide Note  09/19/2023 Name: Randy [name]. MRN: [id number] DOB: 1980-07-07   Complex Care Management Outreach Attempts: An unsuccessful telephone outreach was attempted today to offer the patient information about available complex care management services.  Follow Up Plan:  Additional outreach attempts will be made to offer the patient complex care management information and services.   Encounter Outcome:  No Answer  Barnie Bora  [facility] Health  [facility], [facility] Guide  Direct Dial: [phone]  Fax [phone]

## 2023-09-22 ENCOUNTER — Telehealth: Payer: Self-pay | Admitting: Family Medicine

## 2023-09-22 NOTE — Telephone Encounter (Signed)
 Copied from CRM [id number]. Topic: General - Other >> Sep 22, 2023  1:23 PM Lotus Round B wrote: Reason for CRM: pt called in to speak  with Jocelyn S to get a medication override . If she can follow up with him .  Joss- Are you familiar with what the patient is requesting?

## 2023-09-23 NOTE — Progress Notes (Signed)
 Complex Care Management Note  Care Guide Note 09/23/2023 Name: Jashad Ganus. MRN: [id number] DOB: May 27, 1980  Isaac Manus. is a 43 y.o. year old male who sees Abraham Abo, MD for primary care. I reached out to [employer]. by phone today to offer complex care management services.  Mr. Milan was given information about Complex Care Management services today including:   The Complex Care Management services include support from the care team which includes your Nurse Care Manager, Clinical Social Worker, or Pharmacist.  The Complex Care Management team is here to help remove barriers to the health concerns and goals most important to you. Complex Care Management services are voluntary, and the patient may decline or stop services at any time by request to their care team member.   Complex Care Management Consent Status: Patient agreed to services and verbal consent obtained.   Follow up plan:  Telephone appointment with complex care management team member scheduled for:  5/21  Encounter Outcome:  Patient Scheduled   Barnie Bora  [facility] Health  [facility], [facility] Guide  Direct Dial: [phone]  Fax [phone]

## 2023-10-01 ENCOUNTER — Other Ambulatory Visit: Payer: Self-pay

## 2023-10-01 NOTE — Patient Outreach (Signed)
 Complex Care Management   Visit Note  10/01/2023  Name:  Randy [name]. MRN: [id number] DOB: Nov 23, 1980  Situation: Referral received for Complex Care Management related to information on health insurance and affordable housing  I obtained verbal consent from Patient.  Visit completed with patient  on the phone  Background:   Past Medical History:  Diagnosis Date   Essential hypertension 05/31/2019   HLD (hyperlipidemia)    Pancreatitis    Type II diabetes mellitus (HCC)     Assessment: SW completed a telephone outreach with patient, he is wanting information on health benefits and wanting to move into his own place. SW and patient agreed for resources to be mailed.   SDOH Interventions    Flowsheet Row Patient Outreach Telephone from 10/01/2023 in [place] POPULATION HEALTH DEPARTMENT ED to Hosp-Admission (Discharged) from 05/27/2022 in [place] [facility] ED to Hosp-Admission (Discharged) from 05/10/2022 in [place] [facility]GERY  SDOH Interventions     Food Insecurity Interventions -- Intervention Not Indicated Patient Refused  Housing Interventions Community Resources Provided Intervention Not Indicated --  Transportation Interventions -- Intervention Not Indicated --  Utilities Interventions -- Intervention Not Indicated --       Recommendation:   No recommendations at this time.   Follow Up Plan:   Telephone follow-up 10/17/23   Valora Gear, BSW, MHA [place]  [facility] Social Worker, Population Health [phone]

## 2023-10-01 NOTE — Patient Instructions (Signed)
 Visit Information  [name] was given information about Medicaid Managed Care team care coordination services as a part of their Healthy [facility] Medicaid benefit. [name] [name]. verbally consented to engagement with the [facility] Managed Care team.   If you are experiencing a medical emergency, please call 911 or report to your local emergency department or urgent care.   If you have a non-emergency medical problem during routine business hours, please contact your provider's office and ask to speak with a nurse.   For questions related to your Healthy [facility] health plan, please call: [phone] or visit the homepage here: [email or web address]  If you would like to schedule transportation through your Healthy [facility] plan, please call the following number at least 2 days in advance of your appointment: [phone]  For information about your ride after you set it up, call Ride Assist at [phone]. Use this number to activate a Will Call pickup, or if your transportation is late for a scheduled pickup. Use this number, too, if you need to make a change or cancel a previously scheduled reservation.  If you need transportation services right away, call [phone]. The after-hours call center is staffed 24 hours to handle ride assistance and urgent reservation requests (including discharges) 365 days a year. Urgent trips include sick visits, hospital discharge requests and life-sustaining treatment.  Call the [facility] Line at [phone], at any time, 24 hours a day, 7 days a week. If you are in danger or need immediate medical attention call 911.  If you would like help to quit smoking, call 1-800-QUIT-NOW (337-188-5662) OR Espaol: 1-855-Djelo-Ya (4-742-595-6387) o para ms informacin haga clic aqu or Text READY to 564-332 to register via text  Randy [name] - following are the goals we discussed in your visit today:    Goals Addressed   None      Social Worker will follow up on 10/17/23.   Valora Gear, Florestine Hurl, MHA [place]  Value Based [facility] Institute Social Worker, Population Health [phone]   Following is a copy of your plan of care:  There are no care plans that you recently modified to display for this patient.

## 2023-10-15 ENCOUNTER — Other Ambulatory Visit: Payer: Self-pay

## 2023-10-17 ENCOUNTER — Other Ambulatory Visit: Payer: Self-pay

## 2023-10-17 NOTE — Patient Instructions (Signed)
 Visit Information  Mr. Mi was given information about Medicaid Managed Care team care coordination services as a part of their Healthy [facility] Medicaid benefit. Dartanyon R Hopping [name]. verbally consented to engagement with the [facility] Managed Care team.   If you are experiencing a medical emergency, please call 911 or report to your local emergency department or urgent care.   If you have a non-emergency medical problem during routine business hours, please contact your provider's office and ask to speak with a nurse.   For questions related to your Healthy [facility] health plan, please call: [phone] or visit the homepage here: [email or web address]  If you would like to schedule transportation through your Healthy [facility] plan, please call the following number at least 2 days in advance of your appointment: [phone]  For information about your ride after you set it up, call Ride Assist at [phone]. Use this number to activate a Will Call pickup, or if your transportation is late for a scheduled pickup. Use this number, too, if you need to make a change or cancel a previously scheduled reservation.  If you need transportation services right away, call [phone]. The after-hours call center is staffed 24 hours to handle ride assistance and urgent reservation requests (including discharges) 365 days a year. Urgent trips include sick visits, hospital discharge requests and life-sustaining treatment.  Call the [facility] Line at [phone], at any time, 24 hours a day, 7 days a week. If you are in danger or need immediate medical attention call 911.  If you would like help to quit smoking, call 1-800-QUIT-NOW ((318)532-4226) OR Espaol: 1-855-Djelo-Ya (3-329-518-8416) o para ms informacin haga clic aqu or Text READY to 606-301 to register via text  Mr. Inglett - following are the goals we discussed in your visit today:    Goals Addressed   None     Social Worker will follow up on 10/31/23 at 12pm.   Valora Gear, BSW, MHA [place]  Value [facility] Care Institute Social Worker, Population Health [phone]   Following is a copy of your plan of care:  There are no care plans that you recently modified to display for this patient.

## 2023-10-17 NOTE — Patient Outreach (Signed)
 Complex Care Management   Visit Note  10/17/2023  Name:  Randy [name]. MRN: [id number] DOB: [birth year]  Situation: Referral received for Complex Care Management related to Health insurance and affordable housing I obtained verbal consent from Patient.  Visit completed with patient  on the phone  Background:   Past Medical History:  Diagnosis Date   Essential hypertension 05/31/2019   HLD (hyperlipidemia)    Pancreatitis    Type II diabetes mellitus (HCC)     Assessment: SW completed a telephone outreach with patient, he states he did not receive the resources SW mailed yet. Patient and Sw agreed for resources to be resent via mail and emailed to [email or web address]. Patient did have questions about PCS services. SW will send RN a message to check if referral has been sent.  SDOH Interventions    Flowsheet Row Patient Outreach Telephone from 10/01/2023 in [place] POPULATION HEALTH DEPARTMENT ED to Hosp-Admission (Discharged) from 05/27/2022 in [place] [facility] ED to Hosp-Admission (Discharged) from 05/10/2022 in [place] [facility] SURGERY  SDOH Interventions     Food Insecurity Interventions -- Intervention Not Indicated Patient Refused  Housing Interventions Community Resources Provided Intervention Not Indicated --  Transportation Interventions -- Intervention Not Indicated --  Utilities Interventions -- Intervention Not Indicated --       Recommendation:   No recommendations at this time.  Follow Up Plan:   Telephone follow-up 10/31/23 at 12pm  Valora Gear, BSW, [facility] [place]  Value Based [facility] Social Worker, Population Health [phone]

## 2023-10-29 ENCOUNTER — Other Ambulatory Visit: Payer: Self-pay

## 2023-10-30 ENCOUNTER — Telehealth: Payer: Self-pay

## 2023-10-30 NOTE — Telephone Encounter (Signed)
 I called the patient to inquire if he heard from [facility] about a PCS assessment and he said no. I told him that I would give him the number to call to check on the status of the referral. He said he could not write the number down and I told him that I would call him back and leave the number on his voicemail. He was agreeable to that plan and I called back and left the number for [facility] [place] [facility]: [phone] on his voicemail

## 2023-10-31 ENCOUNTER — Other Ambulatory Visit: Payer: Self-pay

## 2023-10-31 NOTE — Patient Outreach (Signed)
 Complex Care Management   Visit Note  10/31/2023  Name:  Randy [name]. MRN: [id number] DOB: Sep 27, 1980  Situation: Referral received for Complex Care Management related to SDOH Barriers:  Housing affordable housing I obtained verbal consent from Patient.  Visit completed with patient  on the phone  Background:   Past Medical History:  Diagnosis Date   Essential hypertension 05/31/2019   HLD (hyperlipidemia)    Pancreatitis    Type II diabetes mellitus (HCC)     Assessment: SW completed a telephone outreach with patient, he states he mom did not see the resources in the mail and he is unsure if he received the email due to it being heard to see on his phone. Patient requested resources to be sent again via email. Patient states his mom is going to call [employer] to check the status of the referral.  SDOH Interventions    Flowsheet Row Patient Outreach Telephone from 10/01/2023 in [place] POPULATION HEALTH DEPARTMENT ED to Hosp-Admission (Discharged) from 05/27/2022 in [place] [facility] ED to Hosp-Admission (Discharged) from 05/10/2022 in [place] [facility]  SDOH Interventions     Food Insecurity Interventions -- Intervention Not Indicated Patient Refused  Housing Interventions Community Resources Provided Intervention Not Indicated --  Transportation Interventions -- Intervention Not Indicated --  Utilities Interventions -- Intervention Not Indicated --    Recommendation:   No recommendations at this time  Follow Up Plan:   Telephone follow-up on 11/17/23 at 11am  Valora Gear, BSW, MHA [place]  [facility] Social Worker, Population Health [phone]

## 2023-10-31 NOTE — Patient Instructions (Signed)
 Visit Information  Mr. Wert was given information about Medicaid Managed Care team care coordination services as a part of their Healthy [facility] Medicaid benefit. Zaylyn R [place] [name]. verbally consented to engagement with the [facility] Managed Care team.   If you are experiencing a medical emergency, please call 911 or report to your local emergency department or urgent care.   If you have a non-emergency medical problem during routine business hours, please contact your provider's office and ask to speak with a nurse.   For questions related to your Healthy [facility] health plan, please call: [phone] or visit the homepage here: [email or web address]  If you would like to schedule transportation through your Healthy [facility] plan, please call the following number at least 2 days in advance of your appointment: [phone]  For information about your ride after you set it up, call Ride Assist at [phone]. Use this number to activate a Will Call pickup, or if your transportation is late for a scheduled pickup. Use this number, too, if you need to make a change or cancel a previously scheduled reservation.  If you need transportation services right away, call [phone]. The after-hours call center is staffed 24 hours to handle ride assistance and urgent reservation requests (including discharges) 365 days a year. Urgent trips include sick visits, hospital discharge requests and life-sustaining treatment.  Call the [facility] Line at [phone], at any time, 24 hours a day, 7 days a week. If you are in danger or need immediate medical attention call 911.  If you would like help to quit smoking, call 1-800-QUIT-NOW (616-366-4912) OR Espaol: 1-855-Djelo-Ya (3-295-188-4166) o para ms informacin haga clic aqu or Text READY to 063-016 to register via text  Mr. Klawitter - following are the goals we discussed in your visit today:    Goals Addressed   None       Social Worker will follow up on 11/17/23.   Valora Gear, Florestine Hurl, MHA [place]  [facility] Social Worker, Population Health [phone]   Following is a copy of your plan of care:  There are no care plans that you recently modified to display for this patient.

## 2023-11-06 ENCOUNTER — Telehealth: Payer: Self-pay | Admitting: Family Medicine

## 2023-11-06 NOTE — Telephone Encounter (Signed)
 Patient was identified as falling into the [place] Measure - Diabetes.   Patient was: Appointment already scheduled for:  12/04/2023.

## 2023-11-17 ENCOUNTER — Other Ambulatory Visit: Payer: Self-pay

## 2023-11-17 NOTE — Patient Outreach (Signed)
 Complex Care Management   Visit Note  11/17/2023  Name:  Randy [name]. MRN: [id number] DOB: 1980/07/21  Situation: Referral received for Complex Care Management related to SDOH Barriers:  Housing affordable I obtained verbal consent from Patient.  Visit completed with patient  on the phone  Background:   Past Medical History:  Diagnosis Date   Essential hypertension 05/31/2019   HLD (hyperlipidemia)    Pancreatitis    Type II diabetes mellitus (HCC)     Assessment: SW completed a telephone outreach with patient, he states he did not receive the resources SW sent, SW resent resources for housing and [employer] benefits via email. Patient states his mother has not contacted [employer] yet.  No additional resources are needed at this time.  SDOH Interventions    Flowsheet Row Patient Outreach Telephone from 10/01/2023 in [place] POPULATION HEALTH DEPARTMENT ED to Hosp-Admission (Discharged) from 05/27/2022 in [place] [facility] ED to Hosp-Admission (Discharged) from 05/10/2022 in [place] [facility]  SDOH Interventions     Food Insecurity Interventions -- Intervention Not Indicated Patient Refused  Housing Interventions Community Resources Provided Intervention Not Indicated --  Transportation Interventions -- Intervention Not Indicated --  Utilities Interventions -- Intervention Not Indicated --    Recommendation:   No recommendations at this time.  Follow Up Plan:   SW will follow up on 12/08/23 at 1130  [name], [place], [place]  Value Based [facility] Social Worker, Population Health [phone]

## 2023-11-17 NOTE — Patient Instructions (Signed)
 Visit Information  Mr. Fluckiger was given information about Medicaid Managed Care team care coordination services as a part of their Healthy [facility] Medicaid benefit. Devyn R [place]oen[place] Mickey. verbally consented to engagement with the [facility] Managed Care team.   If you are experiencing a medical emergency, please call 911 or report to your local emergency department or urgent care.   If you have a non-emergency medical problem during routine business hours, please contact your provider's office and ask to speak with a nurse.   For questions related to your Healthy [facility] health plan, please call: [phone] or visit the homepage here: [email or web address]  If you would like to schedule transportation through your Healthy [facility] plan, please call the following number at least 2 days in advance of your appointment: [phone]  For information about your ride after you set it up, call Ride Assist at [phone]. Use this number to activate a Will Call pickup, or if your transportation is late for a scheduled pickup. Use this number, too, if you need to make a change or cancel a previously scheduled reservation.  If you need transportation services right away, call [phone]. The after-hours call center is staffed 24 hours to handle ride assistance and urgent reservation requests (including discharges) 365 days a year. Urgent trips include sick visits, hospital discharge requests and life-sustaining treatment.  Call the [facility] Line at [phone], at any time, 24 hours a day, 7 days a week. If you are in danger or need immediate medical attention call 911.  If you would like help to quit smoking, call 1-800-QUIT-NOW (762 523 7327) OR Espaol: 1-855-Djelo-Ya (8-144-664-6430) o para ms informacin haga clic aqu or Text READY to 799-599 to register via text  Mr. Laskin - following are the goals we discussed in your visit today:    Goals Addressed   None      Social Worker will follow up on 12/08/23 at 1130.   [name], HEDWIG, MHA [place]  [facility] Social Worker, Population Health [phone]   Following is a copy of your plan of care:  There are no care plans that you recently modified to display for this patient.

## 2023-11-26 ENCOUNTER — Other Ambulatory Visit: Payer: Self-pay

## 2023-12-04 ENCOUNTER — Other Ambulatory Visit: Payer: Self-pay

## 2023-12-04 ENCOUNTER — Other Ambulatory Visit: Payer: Self-pay | Admitting: Family Medicine

## 2023-12-04 ENCOUNTER — Ambulatory Visit: Admitting: Family Medicine

## 2023-12-05 ENCOUNTER — Telehealth: Payer: Self-pay

## 2023-12-05 ENCOUNTER — Ambulatory Visit: Admitting: Family Medicine

## 2023-12-05 ENCOUNTER — Telehealth: Payer: Self-pay | Admitting: Family Medicine

## 2023-12-05 ENCOUNTER — Other Ambulatory Visit: Payer: Self-pay

## 2023-12-05 VITALS — BP 147/86 | HR 70 | Ht 68.0 in | Wt 193.4 lb

## 2023-12-05 DIAGNOSIS — I10 Essential (primary) hypertension: Secondary | ICD-10-CM

## 2023-12-05 DIAGNOSIS — Z794 Long term (current) use of insulin: Secondary | ICD-10-CM

## 2023-12-05 DIAGNOSIS — E1165 Type 2 diabetes mellitus with hyperglycemia: Secondary | ICD-10-CM

## 2023-12-05 LAB — POCT GLYCOSYLATED HEMOGLOBIN (HGB A1C): HbA1c, POC (controlled diabetic range): 13.6 % — AB (ref 0.0–7.0)

## 2023-12-05 MED ORDER — FREESTYLE LIBRE 3 PLUS SENSOR MISC: 3 refills | Status: DC

## 2023-12-05 NOTE — Telephone Encounter (Signed)
 error

## 2023-12-05 NOTE — Telephone Encounter (Signed)
 Copied from CRM [id number]. Topic: Clinical - Medication Prior Auth >> Dec 05, 2023 10:58 AM Delon HERO wrote: Reason for CRM: Rosina calling from [facility] is calling because the member initiated a PA for [employer] Glucose Sensor (FREESTYLE LIBRE 3 PLUS [place]) [place] [[id number]]- Reference Number [id number]. Please upload clinical information Or please call back with clinical information  Cb- [phone] Option 2 Provider Line
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
## 2023-12-08 ENCOUNTER — Other Ambulatory Visit: Payer: Self-pay

## 2023-12-08 ENCOUNTER — Encounter: Payer: Self-pay | Admitting: Family Medicine

## 2023-12-08 NOTE — Patient Instructions (Signed)
 Visit Information  Randy [name] was given information about Medicaid Managed Care team care coordination services as a part of their Healthy [facility] Medicaid benefit. [name]. verbally consented to engagement with the [facility] Managed Care team.   If you are experiencing a medical emergency, please call 911 or report to your local emergency department or urgent care.   If you have a non-emergency medical problem during routine business hours, please contact your provider's office and ask to speak with a nurse.   For questions related to your Healthy [facility] health plan, please call: [phone] or visit the homepage here: [email or web address]  If you would like to schedule transportation through your Healthy [facility] plan, please call the following number at least 2 days in advance of your appointment: [phone]  For information about your ride after you set it up, call Ride Assist at [phone]. Use this number to activate a Will Call pickup, or if your transportation is late for a scheduled pickup. Use this number, too, if you need to make a change or cancel a previously scheduled reservation.  If you need transportation services right away, call [phone]. The after-hours call center is staffed 24 hours to handle ride assistance and urgent reservation requests (including discharges) 365 days a year. Urgent trips include sick visits, hospital discharge requests and life-sustaining treatment.  Call the [facility] Line at [phone], at any time, 24 hours a day, 7 days a week. If you are in danger or need immediate medical attention call 911.  If you would like help to quit smoking, call 1-800-QUIT-NOW ((415) 456-1146) OR Espaol: 1-855-Djelo-Ya (8-144-664-6430) o para ms informacin haga clic aqu or Text READY to 799-599 to register via text  Mr. Dejonge - following are the goals we discussed in your visit today:    Goals Addressed   None      Social Worker will follow up in 15 days.   [name], HEDWIG, MHA [place]  [facility] Social Worker, Population Health [phone]   Following is a copy of your plan of care:  There are no care plans that you recently modified to display for this patient.

## 2023-12-08 NOTE — Patient Outreach (Signed)
 Complex Care Management   Visit Note  12/08/2023  Name:  Randy [name]. MRN: [id number] DOB: 05-Sep-1980  Situation: Referral received for Complex Care Management related to SDOH Barriers:  Housing affordable I obtained verbal consent from Patient.  Visit completed with patient  on the phone  Background:   Past Medical History:  Diagnosis Date   Essential hypertension 05/31/2019   HLD (hyperlipidemia)    Pancreatitis    Type II diabetes mellitus (HCC)     Assessment: SW completed a telephone outreach with patient, he states his mom needs to know where to call to get the services started. SW and patient discussed how to start obtaining services from [facility]. SW and patient agreed for the telephone number to be emailed to patient. No other resources are needed at this time.  SDOH Interventions    Flowsheet Row Patient Outreach Telephone from 10/01/2023 in [place] POPULATION HEALTH DEPARTMENT ED to Hosp-Admission (Discharged) from 05/27/2022 in [place] [facility]ICS ED to Hosp-Admission (Discharged) from 05/10/2022 in [place] [facility]  SDOH Interventions     Food Insecurity Interventions -- Intervention Not Indicated Patient Refused  Housing Interventions Community Resources Provided Intervention Not Indicated --  Transportation Interventions -- Intervention Not Indicated --  Utilities Interventions -- Intervention Not Indicated --    Recommendation:   No recommendations at this time.  Follow Up Plan:   Telephone follow-up 12/29/23 at 1:00  [name], BSW, [facility] [place]  Value Based [facility] Social Worker, Population Health [phone]

## 2023-12-09 NOTE — Telephone Encounter (Signed)
 Patient is calling in checking on the status of a PA for his sensors for [employer]. Please follow up with patient.

## 2023-12-12 ENCOUNTER — Other Ambulatory Visit: Payer: Self-pay

## 2023-12-16 ENCOUNTER — Telehealth: Payer: Self-pay | Admitting: Family Medicine

## 2023-12-16 NOTE — Telephone Encounter (Signed)
 Confirmed appt for 8/7

## 2023-12-18 ENCOUNTER — Encounter: Payer: Self-pay | Admitting: Pharmacist

## 2023-12-18 ENCOUNTER — Other Ambulatory Visit: Payer: Self-pay

## 2023-12-18 ENCOUNTER — Ambulatory Visit: Payer: Self-pay | Attending: Family Medicine | Admitting: Pharmacist

## 2023-12-18 ENCOUNTER — Telehealth: Payer: Self-pay | Admitting: Pharmacist

## 2023-12-18 DIAGNOSIS — E1165 Type 2 diabetes mellitus with hyperglycemia: Secondary | ICD-10-CM | POA: Diagnosis not present

## 2023-12-18 DIAGNOSIS — Z794 Long term (current) use of insulin: Secondary | ICD-10-CM | POA: Diagnosis not present

## 2023-12-18 MED ORDER — TRUEPLUS 5-BEVEL PEN NEEDLES 32G X 4 MM MISC
2 refills | Status: AC
  Filled 2023-12-18: qty 100, 25d supply, fill #0

## 2023-12-18 MED ORDER — LANTUS SOLOSTAR 100 UNIT/ML ~~LOC~~ SOPN
34.0000 [IU] | PEN_INJECTOR | Freq: Every day | SUBCUTANEOUS | 2 refills | Status: DC
  Filled 2023-12-18: qty 15, 44d supply, fill #0
  Filled 2024-02-24: qty 15, 44d supply, fill #1

## 2023-12-18 MED ORDER — ACCU-CHEK GUIDE W/DEVICE KIT
PACK | 0 refills | Status: AC
  Filled 2023-12-18: qty 1, fill #0
  Filled 2024-03-11: qty 1, 30d supply, fill #0

## 2023-12-18 MED ORDER — ACCU-CHEK SOFTCLIX LANCETS MISC
6 refills | Status: DC
  Filled 2023-12-18: qty 100, 33d supply, fill #0

## 2023-12-18 MED ORDER — ACCU-CHEK GUIDE TEST VI STRP
ORAL_STRIP | 6 refills | Status: DC
  Filled 2023-12-18 – 2024-02-24 (×2): qty 100, 33d supply, fill #0
  Filled 2024-03-03: qty 200, 66d supply, fill #0
  Filled 2024-03-03 (×2): qty 100, 33d supply, fill #0

## 2023-12-18 MED ORDER — FREESTYLE LIBRE 3 PLUS SENSOR MISC
3 refills | Status: DC
  Filled 2023-12-18: qty 2, fill #0
  Filled 2023-12-19: qty 2, 30d supply, fill #0

## 2023-12-18 MED ORDER — INSULIN ASPART 100 UNIT/ML FLEXPEN
10.0000 [IU] | PEN_INJECTOR | Freq: Three times a day (TID) | SUBCUTANEOUS | 2 refills | Status: DC
  Filled 2023-12-18: qty 15, 50d supply, fill #0
  Filled 2024-02-24: qty 15, 50d supply, fill #1

## 2023-12-18 NOTE — Telephone Encounter (Signed)
 Just for tracking, friend. Will you let me know when FL3+ PA is approved?

## 2023-12-18 NOTE — Progress Notes (Signed)
 S:     No chief complaint on file.  43 y.o. male who presents for diabetes evaluation, education, and management.  PMH is significant for HTN, T2DM, HLD, GERD, legally blind in L eye, and pancreatitis.   Patient was referred and last seen by Primary Care Provider, Dr. Tanda on 12/05/2023. A1c at that visit was 13.6%. I last saw him in April, 2024.   Today, patient arrives in good spirits and presents with his son. Patient admits to not having Lantus  for some time. Indeed, per his pharmacy this was last dispensed in Jan of this year. Was previously taking 30 units daily. Currently only takes Novolog  per sliding scale before meals. Has not been checking his blood sugar.   Family/Social History:  -Dhx: HTN, DM -Tobacco:former (quit 2013) -Alcohol: denies -Illicit drug use: denies  Current diabetes medications include: Lantus  30 units at night (not taking), Novolog  per sliding scale before meals Patient admits to suboptimal adherence to insulin  as prescribed. He is taking SSI Novolog  before meals but is not taking Lantus .   Patient denies hypoglycemic events. Reported home fasting blood sugars: none. Requests PA approval for Libre 3 +.  Patient reports nocturia (nighttime urination).  Patient reports increased thirst.  Patient denies neuropathy (nerve pain). Patient reports visual changes. Patient reports self foot exams.   Patient reported dietary habits: Eats 2 meals/day Rice and baked chicken Drinks: Gatorade zero, tea and alkaline water with sodium  O:   Lab Results  Component Value Date   HGBA1C 13.6 (A) 12/05/2023   There were no vitals filed for this visit.  Lipid Panel     Component Value Date/Time   CHOL 313 (H) 06/21/2021 0436   CHOL 322 (H) 05/31/2019 1001   TRIG 257 (H) 05/21/2023 0745   HDL 32 (L) 06/21/2021 0436   HDL 38 (L) 05/31/2019 1001   CHOLHDL 9.8 06/21/2021 0436   VLDL 46 (H) 06/21/2021 0436   LDLCALC 235 (H) 06/21/2021 0436   LDLCALC 196  (H) 05/31/2019 1001   LDLDIRECT 137 (H) 09/01/2019 1115   Clinical Atherosclerotic Cardiovascular Disease (ASCVD): No  The 10-year ASCVD risk score (Arnett DK, et al., 2019) is: 18.5%   Values used to calculate the score:     Age: 25 years     Clincally relevant sex: Male     Is Non-Hispanic African American: Yes     Diabetic: Yes     Tobacco smoker: No     Systolic Blood Pressure: 147 mmHg     Is BP treated: Yes     HDL Cholesterol: 31 mg/dL     Total Cholesterol: 242 mg/dL   A/P: Diabetes longstanding currently uncontrolled based on A1c. He has symptomatic hyperglycemia and his A1c is >10%. Insulin  is the mainstay of his treatment but is complicated d/t visual impairment. He counts clicks with his insulin  pens but has been without Lantus  now for months. Will fill this, Novolog , and Accu Chek supplies in our pharmacy today. We will also pursue PA approval for Libre 3 +. Patient is able to verbalize appropriate hypoglycemia management plan. Medication adherence appears suboptimal. -Resume Lantus  (insulin  glargine) 30 units daily in the morning.  -Continue Novolog  (insulin  aspart) per sliding scale for now. -Extensively discussed pathophysiology of diabetes, recommended lifestyle interventions, dietary effects on blood sugar control.  -Counseled on s/sx of and management of hypoglycemia.  -Next A1c anticipated 02/2024.   Written patient instructions provided. Patient verbalized understanding of treatment plan.  Total time in  face to face counseling 30 minutes.    Follow-up:  Pharmacist clinic visit in 1 month.   Herlene Fleeta Morris, PharmD, JAQUELINE, CPP Clinical Pharmacist [facility] [phone]

## 2023-12-19 ENCOUNTER — Other Ambulatory Visit: Payer: Self-pay

## 2023-12-19 ENCOUNTER — Telehealth: Payer: Self-pay

## 2023-12-19 NOTE — Telephone Encounter (Signed)
 Received letter in mail from [facility] stating the request for freestyle plus herlene was denied. Denial letter scanned into pt's chart under media with more details.

## 2023-12-19 NOTE — Telephone Encounter (Signed)
 Pharmacy Patient Advocate Encounter  Received notification from [facility] that Prior Authorization for FREESTYLE LIBRE 3 PLUS SENSOR has been APPROVED from 12/19/2023 to 12/18/2024   PA #/Case ID/Reference #: [id number]

## 2023-12-22 ENCOUNTER — Telehealth: Payer: Self-pay

## 2023-12-22 ENCOUNTER — Other Ambulatory Visit: Payer: Self-pay

## 2023-12-22 NOTE — Telephone Encounter (Signed)
 Copied from CRM [id number]. Topic: Clinical - Prescription Issue >> Dec 22, 2023  2:51 PM Sasha H wrote: Reason for CRM: Pt called in about PA for [employer] and wants to know if he can pick some up

## 2023-12-23 ENCOUNTER — Other Ambulatory Visit: Payer: Self-pay | Admitting: Family Medicine

## 2023-12-23 ENCOUNTER — Other Ambulatory Visit: Payer: Self-pay

## 2023-12-23 NOTE — Telephone Encounter (Signed)
 Spoke to pt and he had already picked up his sensors

## 2023-12-24 ENCOUNTER — Other Ambulatory Visit: Payer: Self-pay

## 2023-12-25 ENCOUNTER — Other Ambulatory Visit: Payer: Self-pay

## 2023-12-25 MED ORDER — SILDENAFIL CITRATE 100 MG PO TABS
50.0000 mg | ORAL_TABLET | Freq: Every day | ORAL | 11 refills | Status: DC | PRN
  Filled 2023-12-25 – 2024-02-24 (×2): qty 5, 5d supply, fill #0
  Filled 2024-03-30: qty 5, 5d supply, fill #1

## 2023-12-29 ENCOUNTER — Other Ambulatory Visit: Payer: Self-pay

## 2023-12-29 NOTE — Patient Instructions (Signed)
 Visit Information  Randy [name] was given information about Medicaid Managed Care team care coordination services as a part of their Healthy [facility] Medicaid benefit. Randy R Kalmbach Jr.   If you would like to schedule transportation through your Healthy [facility] plan, please call the following number at least 2 days in advance of your appointment: [phone]  For information about your ride after you set it up, call Ride Assist at [phone]. Use this number to activate a Will Call pickup, or if your transportation is late for a scheduled pickup. Use this number, too, if you need to make a change or cancel a previously scheduled reservation.  If you need transportation services right away, call [phone]. The after-hours call center is staffed 24 hours to handle ride assistance and urgent reservation requests (including discharges) 365 days a year. Urgent trips include sick visits, hospital discharge requests and life-sustaining treatment.  Call the [facility] Line at [phone], at any time, 24 hours a day, 7 days a week. If you are in danger or need immediate medical attention call 911.   Randy [name] - following are the goals we discussed in your visit today:   Goals Addressed   None       The  Patient                                              has been provided with contact information for the Managed Medicaid care management team and has been advised to call with any health related questions or concerns.   [name], HEDWIG, MHA [place]  [facility] Social Worker, Population Health [phone]   Following is a copy of your plan of care:  There are no care plans that you recently modified to display for this patient.

## 2023-12-29 NOTE — Patient Outreach (Signed)
 Complex Care Management   Visit Note  12/29/2023  Name:  Randy [name]. MRN: [id number] DOB: 07/26/1980  Situation: Referral received for Complex Care Management related to SDOH Barriers:  Housing affordable I obtained verbal consent from Patient.  Visit completed with patient  on the phone  Background:   Past Medical History:  Diagnosis Date   Essential hypertension 05/31/2019   HLD (hyperlipidemia)    Pancreatitis    Type II diabetes mellitus (HCC)     Assessment: SW completed a telephone outreach with patient, he states he is currently out of town and will be until next month. Patient states [employer] sent him a debit card but he does not know where to use it. SW encouraged patient to contact [employer] to see where he can use the card. Patient states no other resources are needed at this time. SW provided patient with contact information for any future needs.  SDOH Interventions    Flowsheet Row Patient Outreach Telephone from 10/01/2023 in [place] [facility] ED to Hosp-Admission (Discharged) from 05/27/2022 in [place] [facility] to Hosp-Admission (Discharged) from 05/10/2022 in [place] [facility]  SDOH Interventions     Food Insecurity Interventions -- Intervention Not Indicated Patient Refused  Housing Interventions Community Resources Provided Intervention Not Indicated --  Transportation Interventions -- Intervention Not Indicated --  Utilities Interventions -- Intervention Not Indicated --    Recommendation:   No recommendations at this time.  Follow Up Plan:   Patient has met all care management goals. Care Management case will be closed. Patient has been provided contact information should new needs arise.   [name], HEDWIG, MHA [place]  [facility] Social Worker, Population Health [phone]

## 2023-12-30 ENCOUNTER — Other Ambulatory Visit: Payer: Self-pay

## 2023-12-30 ENCOUNTER — Other Ambulatory Visit (HOSPITAL_COMMUNITY): Payer: Self-pay

## 2023-12-31 ENCOUNTER — Other Ambulatory Visit (HOSPITAL_COMMUNITY): Payer: Self-pay

## 2024-01-06 ENCOUNTER — Other Ambulatory Visit: Payer: Self-pay

## 2024-01-29 ENCOUNTER — Telehealth: Payer: Self-pay | Admitting: Family Medicine

## 2024-01-29 NOTE — Telephone Encounter (Signed)
 Confirmed appt 9/19

## 2024-01-30 ENCOUNTER — Ambulatory Visit: Payer: Self-pay | Admitting: Pharmacist

## 2024-02-05 ENCOUNTER — Other Ambulatory Visit: Payer: Self-pay | Admitting: Family Medicine

## 2024-02-12 ENCOUNTER — Encounter: Payer: Self-pay | Admitting: Family Medicine

## 2024-02-17 ENCOUNTER — Ambulatory Visit: Attending: Cardiology | Admitting: Cardiology

## 2024-02-24 ENCOUNTER — Other Ambulatory Visit (HOSPITAL_BASED_OUTPATIENT_CLINIC_OR_DEPARTMENT_OTHER): Payer: Self-pay

## 2024-02-24 ENCOUNTER — Other Ambulatory Visit (HOSPITAL_COMMUNITY): Payer: Self-pay

## 2024-02-24 ENCOUNTER — Other Ambulatory Visit: Payer: Self-pay

## 2024-02-24 ENCOUNTER — Other Ambulatory Visit: Payer: Self-pay | Admitting: Family Medicine

## 2024-02-24 DIAGNOSIS — Z794 Long term (current) use of insulin: Secondary | ICD-10-CM

## 2024-02-24 NOTE — Telephone Encounter (Signed)
 Patient appointment rescheduled to 11/7 at 2 pm.

## 2024-02-24 NOTE — Telephone Encounter (Signed)
 Copied from CRM [id number]. Topic: Appointments - Scheduling Inquiry for Clinic >> Feb 24, 2024  1:35 PM Tobias CROME wrote:  Reason for CRM: Patient requesting appointment with Herlene.   Requesting callback: [phone]

## 2024-02-27 ENCOUNTER — Other Ambulatory Visit: Payer: Self-pay

## 2024-02-28 ENCOUNTER — Other Ambulatory Visit: Payer: Self-pay | Admitting: Family Medicine

## 2024-03-03 ENCOUNTER — Other Ambulatory Visit: Payer: Self-pay | Admitting: Family Medicine

## 2024-03-03 ENCOUNTER — Other Ambulatory Visit: Payer: Self-pay

## 2024-03-03 DIAGNOSIS — E1165 Type 2 diabetes mellitus with hyperglycemia: Secondary | ICD-10-CM

## 2024-03-03 NOTE — Telephone Encounter (Unsigned)
 Copied from CRM [id number]. Topic: Clinical - Medication Refill >> Mar 03, 2024  9:32 AM Tiffini S wrote: Medication: Continuous Glucose Sensor (FREESTYLE LIBRE 3 PLUS SENSOR) MISC, insulin  glargine (LANTUS  SOLOSTAR) 200 UNIT/ML Solostar Pen glucose blood (ACCU-CHEK GUIDE TEST) test strip, 200- dropped the test strips- need increase   Has the patient contacted their pharmacy? Yes (Agent: If no, request that the patient contact the pharmacy for the refill. If patient does not wish to contact the pharmacy document the reason why and proceed with request.) (Agent: If yes, when and what did the pharmacy advise?)  This is the patient's preferred pharmacy:    [facility] [address] [place] [address] Phone: [phone] Fax: [phone]  Is this the correct pharmacy for this prescription? Yes If no, delete pharmacy and type the correct one.   Has the prescription been filled recently? Yes  Is the patient out of the medication? Yes, do not have anything to check sugar   Has the patient been seen for an appointment in the last year OR does the patient have an upcoming appointment? Yes  Can we respond through MyChart? No, please call at [phone]  Agent: Please be advised that Rx refills may take up to 3 business days. We ask that you follow-up with your pharmacy.

## 2024-03-03 NOTE — Telephone Encounter (Unsigned)
 Copied from CRM [id number]. Topic: Clinical - Medication Refill >> Mar 03, 2024  3:58 PM Delon T wrote: Medication: Accu-Chek Softclix Lancets lancets- completely out, need asap  Has the patient contacted their pharmacy? Yes (Agent: If no, request that the patient contact the pharmacy for the refill. If patient does not wish to contact the pharmacy document the reason why and proceed with request.) (Agent: If yes, when and what did the pharmacy advise?)    [facility] [address] [place] [address] Phone: [phone] Fax: [phone]  Is this the correct pharmacy for this prescription? Yes If no, delete pharmacy and type the correct one.   Has the prescription been filled recently? Yes  Is the patient out of the medication? Yes  Has the patient been seen for an appointment in the last year OR does the patient have an upcoming appointment? Yes  Can we respond through MyChart? Yes  Agent: Please be advised that Rx refills may take up to 3 business days. We ask that you follow-up with your pharmacy.

## 2024-03-04 ENCOUNTER — Telehealth: Payer: Self-pay

## 2024-03-04 ENCOUNTER — Other Ambulatory Visit: Payer: Self-pay

## 2024-03-04 ENCOUNTER — Other Ambulatory Visit: Payer: Self-pay | Admitting: Family Medicine

## 2024-03-04 NOTE — Telephone Encounter (Signed)
 Copied from CRM [id number]. Topic: Clinical - Prescription Issue >> Mar 04, 2024 12:51 PM Fonda T wrote: Reason for CRM: Received call from [patient, checking status of refills on [place] sensor, test strips and lancets.   Per chart review: FREESTYLE LIBRE 3 PLUS SENSOR E-Prescribing Status: Receipt confirmed by pharmacy (02/24/2024  1:28 PM EDT), sent to [employer].  Informed patient of above, Patient states previously requested test strips and lancets were needed as well.  Patient requesting these be sent to pharmacy as soon as possible, as he is out of both, and follow up with a call when completed, can be reached at [phone].  [id number] - [place] - [address] [place]) [place] [address] Phone: [phone] Fax: [phone]

## 2024-03-05 ENCOUNTER — Other Ambulatory Visit: Payer: Self-pay

## 2024-03-05 MED ORDER — ACCU-CHEK SOFTCLIX LANCETS MISC
0 refills | Status: DC
  Filled 2024-03-05: qty 100, 25d supply, fill #0

## 2024-03-05 MED ORDER — LANTUS SOLOSTAR 100 UNIT/ML ~~LOC~~ SOPN
34.0000 [IU] | PEN_INJECTOR | Freq: Every day | SUBCUTANEOUS | 2 refills | Status: DC
  Filled 2024-03-05: qty 15, 44d supply, fill #0

## 2024-03-05 MED ORDER — ACCU-CHEK GUIDE TEST VI STRP
1.0000 | ORAL_STRIP | Freq: Three times a day (TID) | 0 refills | Status: DC
  Filled 2024-03-05: qty 100, 34d supply, fill #0

## 2024-03-05 NOTE — Telephone Encounter (Signed)
 Requested Prescriptions  Pending Prescriptions Disp Refills   Accu-Chek Softclix Lancets lancets 400 each 0    Sig: USE  STRIP TO CHECK GLUCOSE UP TO 4 TIMES DAILY     Endocrinology: Diabetes - Testing Supplies Passed - 03/05/2024 12:07 PM      Passed - Valid encounter within last 12 months    Recent Outpatient Visits           2 months ago Long term (current) use of insulin  (HCC)   [place] [facility] [place] Fleeta Tonia Senior L, RPH-CPP   3 months ago Type 2 diabetes mellitus with hyperglycemia, with long-term current use of insulin  [facility])   [place] [facility], MD   6 months ago Uncontrolled hypertension   [place] [facility], MD   11 months ago Uncontrolled hypertension   [place] [facility], MD   1 year ago Uncontrolled hypertension   [place] [facility], MD

## 2024-03-05 NOTE — Telephone Encounter (Signed)
 Requested Prescriptions  Pending Prescriptions Disp Refills   insulin  glargine (LANTUS  SOLOSTAR) 100 UNIT/ML Solostar Pen 15 mL 2    Sig: Inject 34 Units into the skin daily.     Endocrinology:  Diabetes - Insulins Failed - 03/05/2024  9:09 AM      Failed - HBA1C is between 0 and 7.9 and within 180 days    HbA1c, POC (controlled diabetic range)  Date Value Ref Range Status  12/05/2023 13.6 (A) 0.0 - 7.0 % Final         Passed - Valid encounter within last 6 months    Recent Outpatient Visits           2 months ago Long term (current) use of insulin  (HCC)   [place] [facility] [place] Fleeta Tonia Senior L, RPH-CPP   3 months ago Type 2 diabetes mellitus with hyperglycemia, with long-term current use of insulin  [facility])   [place] [facility], MD   6 months ago Uncontrolled hypertension   [place] [facility], MD   11 months ago Uncontrolled hypertension   [place] [facility], MD   1 year ago Uncontrolled hypertension   [place] [facility] [place], MD               glucose blood (ACCU-CHEK GUIDE TEST) test strip 300 each 0    Sig: Use to check blood sugar 3 times daily.     There is no refill protocol information for this order    Refused Prescriptions Disp Refills   Continuous Glucose Sensor (FREESTYLE LIBRE 3 PLUS SENSOR) MISC 2 each 0    Sig: CHANGE SENSOR EVERY 15 DAYS. USE TO CHECK BLOOD SUGAR CONTINUOUSLY     There is no refill protocol information for this order

## 2024-03-08 ENCOUNTER — Ambulatory Visit: Admitting: Family Medicine

## 2024-03-08 ENCOUNTER — Telehealth: Payer: Self-pay

## 2024-03-08 DIAGNOSIS — Z794 Long term (current) use of insulin: Secondary | ICD-10-CM

## 2024-03-08 MED ORDER — FREESTYLE LIBRE 3 PLUS SENSOR MISC
0 refills | Status: DC
Start: 2024-03-08 — End: 2024-03-19

## 2024-03-08 MED ORDER — ACCU-CHEK GUIDE TEST VI STRP
1.0000 | ORAL_STRIP | Freq: Three times a day (TID) | 0 refills | Status: DC
  Filled 2024-03-09: qty 100, 34d supply, fill #0

## 2024-03-08 NOTE — Telephone Encounter (Signed)
 Copied from CRM [id number]. Topic: Clinical - Medication Question >> Mar 08, 2024 12:23 PM Tonda B wrote: Reason for CRM: pt has questions about rx   FREESTYLE LIBRE 3 PLUS SENSOR   Please call pt back [phone]

## 2024-03-08 NOTE — Telephone Encounter (Signed)
 Patient requested strips and sensor for glucose monitoring.  Has appt with [facility] 03/23/2024.

## 2024-03-08 NOTE — Addendum Note (Signed)
 Addended by: Kelven Flater on: 03/08/2024 04:54 PM   Modules accepted: Orders

## 2024-03-09 ENCOUNTER — Other Ambulatory Visit: Payer: Self-pay

## 2024-03-09 ENCOUNTER — Other Ambulatory Visit (HOSPITAL_BASED_OUTPATIENT_CLINIC_OR_DEPARTMENT_OTHER): Payer: Self-pay

## 2024-03-10 ENCOUNTER — Other Ambulatory Visit (HOSPITAL_COMMUNITY): Payer: Self-pay

## 2024-03-10 ENCOUNTER — Telehealth: Payer: Self-pay

## 2024-03-10 ENCOUNTER — Other Ambulatory Visit: Payer: Self-pay | Admitting: Pharmacist

## 2024-03-10 ENCOUNTER — Other Ambulatory Visit: Payer: Self-pay | Admitting: Family Medicine

## 2024-03-10 ENCOUNTER — Other Ambulatory Visit: Payer: Self-pay

## 2024-03-10 ENCOUNTER — Other Ambulatory Visit (HOSPITAL_BASED_OUTPATIENT_CLINIC_OR_DEPARTMENT_OTHER): Payer: Self-pay

## 2024-03-10 DIAGNOSIS — Z794 Long term (current) use of insulin: Secondary | ICD-10-CM

## 2024-03-10 MED ORDER — ACCU-CHEK SOFTCLIX LANCETS MISC
1 refills | Status: AC
  Filled 2024-03-10: qty 200, 33d supply, fill #0

## 2024-03-10 MED ORDER — ACCU-CHEK GUIDE TEST VI STRP
ORAL_STRIP | 1 refills | Status: DC
  Filled 2024-03-10: qty 200, 33d supply, fill #0
  Filled 2024-03-30 – 2024-04-05 (×2): qty 200, 33d supply, fill #1
  Filled 2024-04-28: qty 200, 33d supply, fill #2
  Filled ????-??-??: fill #2

## 2024-03-10 NOTE — Telephone Encounter (Signed)
 Patient came in requesting refills on amLODipine  (NORVASC ) 10 MG tablet

## 2024-03-10 NOTE — Telephone Encounter (Signed)
 Copied from CRM [id number]. Topic: Clinical - Prescription Issue >> Mar 10, 2024 10:10 AM Nathanel BROCKS wrote: Reason for CRM: glucose blood (ACCU-CHEK GUIDE TEST) test strip [[id number]]  Pharmacy is stating that they do not have this rx. Could you please resend or check with them on it.

## 2024-03-11 ENCOUNTER — Other Ambulatory Visit: Payer: Self-pay

## 2024-03-11 NOTE — Telephone Encounter (Signed)
 Refill request received. Still pending.

## 2024-03-16 ENCOUNTER — Other Ambulatory Visit: Payer: Self-pay

## 2024-03-16 MED ORDER — AMLODIPINE BESYLATE 10 MG PO TABS
10.0000 mg | ORAL_TABLET | Freq: Every day | ORAL | 1 refills | Status: AC
Start: 2024-03-16 — End: ?
  Filled 2024-03-16: qty 90, 90d supply, fill #0

## 2024-03-18 ENCOUNTER — Other Ambulatory Visit: Payer: Self-pay

## 2024-03-19 ENCOUNTER — Ambulatory Visit: Attending: Family Medicine | Admitting: Pharmacist

## 2024-03-19 ENCOUNTER — Other Ambulatory Visit: Payer: Self-pay

## 2024-03-19 ENCOUNTER — Other Ambulatory Visit: Payer: Self-pay | Admitting: Family Medicine

## 2024-03-19 DIAGNOSIS — E1165 Type 2 diabetes mellitus with hyperglycemia: Secondary | ICD-10-CM | POA: Diagnosis not present

## 2024-03-19 DIAGNOSIS — Z794 Long term (current) use of insulin: Secondary | ICD-10-CM | POA: Diagnosis not present

## 2024-03-19 LAB — POCT GLYCOSYLATED HEMOGLOBIN (HGB A1C): HbA1c, POC (controlled diabetic range): 10.1 % — AB (ref 0.0–7.0)

## 2024-03-19 MED ORDER — FREESTYLE LIBRE 3 PLUS SENSOR MISC: 11 refills | Status: DC

## 2024-03-19 MED ORDER — LANTUS SOLOSTAR 100 UNIT/ML ~~LOC~~ SOPN
35.0000 [IU] | PEN_INJECTOR | Freq: Every day | SUBCUTANEOUS | 2 refills | Status: DC
  Filled 2024-03-19: qty 15, 42d supply, fill #0

## 2024-03-19 NOTE — Progress Notes (Signed)
 S:     No chief complaint on file.  43 y.o. male who presents for diabetes evaluation, education, and management. PMH is significant for HTN, T2DM, HLD, GERD, legally blind in L eye, and pancreatitis.   Patient was referred and last seen by Primary Care Provider, Dr. Tanda on 12/05/2023. A1c at that visit was 13.6%. I last saw him 12/18/2023 and pursued Herlene approval. This was approved later that day and patient picked up 12/23/2023. At that visit with me, I also had him restart his Lantus  as he was only using SSI with Novolog  at the time..   Today, patient arrives in good spirits and presents with his son. He does not have reader for review but endorses readings in the 100s - 150s in the morning. Tells me that he does get alerted for readings > 250 mg/dL every day. Also admits that his sugar will go too low but this has happened infrequently. He is taking the Lantus  and Novolog  as prescribed.    Family/Social History:  -Dhx: HTN, DM -Tobacco:former (quit 2013) -Alcohol: denies -Illicit drug use: denies  Current diabetes medications include: Lantus  30 units at night, Novolog  per sliding scale before meals (uses ~5-10 units per meal) Patient reports adherence to insulin  as prescribed.  Patient denies hypoglycemic events.   Patient denies polyuria, polydipsia.  Patient denies neuropathy (nerve pain). Patient reports visual changes. Patient reports self foot exams.   Patient reported dietary habits: Eats 1-2 meals/day Rice and baked chicken Drinks: Gatorade zero, tea and alkaline water with sodium  O:  Lab Results  Component Value Date   HGBA1C 13.6 (A) 12/05/2023   There were no vitals filed for this visit.  Lipid Panel     Component Value Date/Time   CHOL 313 (H) 06/21/2021 0436   CHOL 322 (H) 05/31/2019 1001   TRIG 257 (H) 05/21/2023 0745   HDL 32 (L) 06/21/2021 0436   HDL 38 (L) 05/31/2019 1001   CHOLHDL 9.8 06/21/2021 0436   VLDL 46 (H) 06/21/2021 0436   LDLCALC  235 (H) 06/21/2021 0436   LDLCALC 196 (H) 05/31/2019 1001   LDLDIRECT 137 (H) 09/01/2019 1115   Clinical Atherosclerotic Cardiovascular Disease (ASCVD): No  The 10-year ASCVD risk score (Arnett DK, et al., 2019) is: 24.8%   Values used to calculate the score:     Age: 72 years     Clincally relevant sex: Male     Is Non-Hispanic African American: Yes     Diabetic: Yes     Tobacco smoker: No     Systolic Blood Pressure: 175 mmHg     Is BP treated: Yes     HDL Cholesterol: 31 mg/dL     Total Cholesterol: 242 mg/dL   A/P: Diabetes longstanding currently uncontrolled based on A1c. However, his A1c of 10.1% is down from 13.6%. Insulin  is the mainstay of his treatment but is complicated d/t visual impairment. He counts clicks with his insulin  pens. Will continue Lantus , Novolog . Patient is able to verbalize appropriate hypoglycemia management plan. Medication adherence appears suboptimal. -INCREASE Lantus  (insulin  glargine) to 35 units daily in the morning.  -Continue Novolog  (insulin  aspart) per sliding scale for now. -Extensively discussed pathophysiology of diabetes, recommended lifestyle interventions, dietary effects on blood sugar control.  -Counseled on s/sx of and management of hypoglycemia.  -Next A1c anticipated 06/2024.   Written patient instructions provided. Patient verbalized understanding of treatment plan. Total time in face to face counseling 30 minutes.    Follow-up:  Pharmacist clinic visit in 1 month.   Herlene Fleeta Morris, PharmD, JAQUELINE, CPP Clinical Pharmacist [facility] [phone]

## 2024-03-30 ENCOUNTER — Other Ambulatory Visit: Payer: Self-pay | Admitting: Pharmacist

## 2024-03-30 ENCOUNTER — Other Ambulatory Visit: Payer: Self-pay

## 2024-03-30 MED ORDER — SILDENAFIL CITRATE 100 MG PO TABS
50.0000 mg | ORAL_TABLET | Freq: Every day | ORAL | 0 refills | Status: AC | PRN
  Filled 2024-04-05: qty 10, 10d supply, fill #0

## 2024-04-02 ENCOUNTER — Other Ambulatory Visit: Payer: Self-pay | Admitting: Family Medicine

## 2024-04-02 ENCOUNTER — Other Ambulatory Visit: Payer: Self-pay

## 2024-04-02 ENCOUNTER — Telehealth: Payer: Self-pay | Admitting: Family Medicine

## 2024-04-02 NOTE — Telephone Encounter (Signed)
 Copied from CRM [id number]. Topic: General - Transportation >> Apr 02, 2024  2:57 PM Victoria B wrote: Reason for CRM: patient called in states needs a ride for appt with pharmacist on Monday 11/24. I let him know have to have 48-72 hr notice. He states he has had this done before and was able to get a ride

## 2024-04-02 NOTE — Telephone Encounter (Signed)
 Requested  by interface surescripts. Courtesy refill. Future OV 04/19/24.  Requested Prescriptions  Pending Prescriptions Disp Refills   lisinopril  (ZESTRIL ) 20 MG tablet [Pharmacy Med Name: Lisinopril  20 MG Oral Tablet] 90 tablet 0    Sig: Take 1 tablet by mouth once daily     Cardiovascular:  ACE Inhibitors Failed - 04/02/2024  1:04 PM      Failed - Cr in normal range and within 180 days    Creatinine, Ser  Date Value Ref Range Status  05/25/2023 2.03 (H) 0.61 - 1.24 mg/dL Final   Creatinine, Urine  Date Value Ref Range Status  04/02/2021 35.49 mg/dL Final    Comment:    Performed at [facility], [address] [place], [place] [address]         Failed - K in normal range and within 180 days    Potassium  Date Value Ref Range Status  05/25/2023 5.1 3.5 - 5.1 mmol/L Final         Failed - Last BP in normal range    BP Readings from Last 1 Encounters:  12/05/23 (!) 147/86         Passed - Patient is not pregnant      Passed - Valid encounter within last 6 months    Recent Outpatient Visits           2 weeks ago Type 2 diabetes mellitus with hyperglycemia, with long-term current use of insulin  (HCC)   [place] [facility] [place] Fleeta Morris, Garnette CROME, RPH-CPP   3 months ago Long term (current) use of insulin  [facility])   [place] [facility] [place]. [facility] Fleeta Morris Garnette L, RPH-CPP   3 months ago Type 2 diabetes mellitus with hyperglycemia, with long-term current use of insulin  [facility])   [place] [facility], MD   7 months ago Uncontrolled hypertension   [place] [facility], MD   12 months ago Uncontrolled hypertension   [place] [facility], MD

## 2024-04-02 NOTE — Telephone Encounter (Signed)
 Pt needs a taxi voucher for appt on 11/24 with [facility]. I did not see any appts scheduled for that day. Please advise.

## 2024-04-05 ENCOUNTER — Other Ambulatory Visit: Payer: Self-pay

## 2024-04-05 ENCOUNTER — Other Ambulatory Visit: Payer: Self-pay | Admitting: Pharmacist

## 2024-04-05 DIAGNOSIS — Z794 Long term (current) use of insulin: Secondary | ICD-10-CM

## 2024-04-05 MED ORDER — FREESTYLE LIBRE 3 PLUS SENSOR MISC
11 refills | Status: AC
  Filled 2024-04-05 (×2): qty 2, 30d supply, fill #0
  Filled 2024-04-05: qty 1, 15d supply, fill #0
  Filled 2024-04-05: qty 1, 15d supply, fill #1
  Filled 2024-04-28: qty 2, 30d supply, fill #1
  Filled 2024-05-28: qty 2, 30d supply, fill #2

## 2024-04-09 ENCOUNTER — Other Ambulatory Visit: Payer: Self-pay

## 2024-04-19 ENCOUNTER — Telehealth: Payer: Self-pay | Admitting: Family Medicine

## 2024-04-19 ENCOUNTER — Encounter: Payer: Self-pay | Admitting: Family Medicine

## 2024-04-19 ENCOUNTER — Ambulatory Visit: Admitting: Family Medicine

## 2024-04-19 NOTE — Telephone Encounter (Signed)
 Called patient about missed appt on 12/8 to reschedule. Left voicemail.

## 2024-04-28 ENCOUNTER — Other Ambulatory Visit: Payer: Self-pay

## 2024-04-29 ENCOUNTER — Other Ambulatory Visit: Payer: Self-pay

## 2024-04-29 ENCOUNTER — Other Ambulatory Visit: Payer: Self-pay | Admitting: Family Medicine

## 2024-04-29 ENCOUNTER — Telehealth: Payer: Self-pay | Admitting: Pharmacist

## 2024-04-29 DIAGNOSIS — E1165 Type 2 diabetes mellitus with hyperglycemia: Secondary | ICD-10-CM

## 2024-04-29 MED FILL — Glucose Blood Test Strip: 33 days supply | Qty: 200 | Fill #0 | Status: CN

## 2024-04-29 NOTE — Telephone Encounter (Signed)
 Requested medication (s) are due for refill today: Yes  Requested medication (s) are on the active medication list: Yes  Last refill:  03/10/24  Future visit scheduled:No  Notes to clinic:  Unable to refill due to no refill protocol for this medication.      Requested Prescriptions  Pending Prescriptions Disp Refills   glucose blood (ACCU-CHEK GUIDE TEST) test strip 600 each 1    Sig: Use to check blood sugar 6 times daily.     There is no refill protocol information for this order

## 2024-04-29 NOTE — Telephone Encounter (Signed)
 Copied from CRM #[id number]. Topic: General - Other >> Apr 29, 2024  1:08 PM Amber H wrote: Reason for CRM: Patient called requesting to speak to [facility] the pharmacist at the [facility] and [employer]. I did advise patient that they are on lunch and I can send a message requesting that Herlene reached out to him.    [place]- [phone]

## 2024-04-29 NOTE — Telephone Encounter (Signed)
 Copied from CRM #[id number]. Topic: Clinical - Medication Refill >> Apr 29, 2024  1:13 PM [facility] wrote: Medication: glucose blood (ACCU-CHEK GUIDE TEST) test strip requesting 400 d/t being out of town  Has the patient contacted their pharmacy? Yes, patient will be out of town and his pharmacist Herlene normally handles issue however, unable to get in touch with him (Agent: If no, request that the patient contact the pharmacy for the refill. If patient does not wish to contact the pharmacy document the reason why and proceed with request.) (Agent: If yes, when and what did the pharmacy advise?)   [facility] - [facility] [address], [address] [place] [address] Phone: [phone] Fax: [phone]  Is this the correct pharmacy for this prescription? Yes If no, delete pharmacy and type the correct one.   Has the prescription been filled recently? Yes  Is the patient out of the medication? Yes  Has the patient been seen for an appointment in the last year OR does the patient have an upcoming appointment? Yes  Can we respond through MyChart? No, phone call [phone]  Agent: Please be advised that Rx refills may take up to 3 business days. We ask that you follow-up with your pharmacy.

## 2024-04-30 ENCOUNTER — Other Ambulatory Visit: Payer: Self-pay

## 2024-04-30 MED FILL — Glucose Blood Test Strip: 33 days supply | Qty: 200 | Fill #0 | Status: AC

## 2024-05-04 ENCOUNTER — Other Ambulatory Visit: Payer: Self-pay

## 2024-05-19 ENCOUNTER — Other Ambulatory Visit: Payer: Self-pay

## 2024-05-28 ENCOUNTER — Other Ambulatory Visit: Payer: Self-pay

## 2024-05-28 MED FILL — Glucose Blood Test Strip: 33 days supply | Qty: 200 | Fill #1 | Status: AC

## 2024-06-10 ENCOUNTER — Other Ambulatory Visit: Payer: Self-pay | Admitting: Family Medicine

## 2024-06-10 DIAGNOSIS — E1165 Type 2 diabetes mellitus with hyperglycemia: Secondary | ICD-10-CM

## 2024-06-10 DIAGNOSIS — I10 Essential (primary) hypertension: Secondary | ICD-10-CM

## 2024-06-10 NOTE — Telephone Encounter (Signed)
 Copied from CRM [id number]. Topic: Clinical - Medication Refill >> Jun 10, 2024  4:16 PM Burnard DEL wrote: Medication: insulin  aspart (NOVOLOG ) 100 UNIT/ML FlexPen  insulin  glargine (LANTUS  SOLOSTAR) 100 UNIT/ML Solostar Pen  amLODipine  (NORVASC ) 10 MG tablet  atorvastatin  (LIPITOR ) 80 MG tablet  carvedilol  (COREG ) 25 MG tablet  lisinopril  (ZESTRIL ) 20 MG tablet  hydrALAZINE  (APRESOLINE ) 100 MG tablet Has the patient contacted their pharmacy? No (Agent: If no, request that the patient contact the pharmacy for the refill. If patient does not wish to contact the pharmacy document the reason why and proceed with request.) (Agent: If yes, when and what did the pharmacy advise?)  This is the patient's preferred pharmacy:    [facility] - [facility] [address] [place] [address] Phone: [phone] Fax: [phone]  Is this the correct pharmacy for this prescription? Yes If no, delete pharmacy and type the correct one.   Has the prescription been filled recently? No  Is the patient out of the medication? Yes  Has the patient been seen for an appointment in the last year OR does the patient have an upcoming appointment? Yes  Can we respond through MyChart? no  Agent: Please be advised that Rx refills may take up to 3 business days. We ask that you follow-up with your pharmacy.

## 2024-06-11 ENCOUNTER — Other Ambulatory Visit: Payer: Self-pay

## 2024-06-11 MED ORDER — LANTUS SOLOSTAR 100 UNIT/ML ~~LOC~~ SOPN
35.0000 [IU] | PEN_INJECTOR | Freq: Every day | SUBCUTANEOUS | 2 refills | Status: AC
  Filled 2024-06-11: qty 15, 42d supply, fill #0

## 2024-06-11 MED ORDER — ATORVASTATIN CALCIUM 80 MG PO TABS
80.0000 mg | ORAL_TABLET | Freq: Every day | ORAL | 1 refills | Status: AC
  Filled 2024-06-11: qty 90, 90d supply, fill #0

## 2024-06-11 MED ORDER — LISINOPRIL 20 MG PO TABS
20.0000 mg | ORAL_TABLET | Freq: Every day | ORAL | 0 refills | Status: AC
  Filled 2024-06-11: qty 90, 90d supply, fill #0

## 2024-06-11 MED ORDER — CARVEDILOL 25 MG PO TABS
25.0000 mg | ORAL_TABLET | Freq: Two times a day (BID) | ORAL | 1 refills | Status: AC
  Filled 2024-06-11: qty 180, 90d supply, fill #0

## 2024-06-11 MED ORDER — INSULIN ASPART 100 UNIT/ML FLEXPEN
10.0000 [IU] | PEN_INJECTOR | Freq: Three times a day (TID) | SUBCUTANEOUS | 2 refills | Status: AC
  Filled 2024-06-11: qty 6, 20d supply, fill #0

## 2024-06-11 MED ORDER — HYDRALAZINE HCL 100 MG PO TABS
100.0000 mg | ORAL_TABLET | Freq: Two times a day (BID) | ORAL | 1 refills | Status: AC
  Filled 2024-06-11: qty 180, 90d supply, fill #0

## 2024-06-11 NOTE — Telephone Encounter (Signed)
 Requested Prescriptions  Pending Prescriptions Disp Refills   insulin  aspart (NOVOLOG ) 100 UNIT/ML FlexPen 15 mL 2    Sig: Inject 10 Units into the skin 3 (three) times daily with meals.     Endocrinology:  Diabetes - Insulins Failed - 06/11/2024  2:28 PM      Failed - HBA1C is between 0 and 7.9 and within 180 days    HbA1c, POC (controlled diabetic range)  Date Value Ref Range Status  03/19/2024 10.1 (A) 0.0 - 7.0 % Final         Passed - Valid encounter within last 6 months    Recent Outpatient Visits           2 months ago Type 2 diabetes mellitus with hyperglycemia, with long-term current use of insulin  [facility])   [place] [facility] Dept Of [place]. [facility] Fle[facility] Morris, Garnette CROME, RPH-CPP   5 months ago Long term (current) use of insulin  [facility])   [place] [facility] [place] - A Dept Of [place]. [facility] Fleeta Morris Garnette L, RPH-CPP   6 months ago Type 2 diabetes mellitus with hyperglycemia, with long-term current use of insulin  [facility])   [place] [facility], MD   9 months ago Uncontrolled hypertension   [place] [facility], MD   1 year ago Uncontrolled hypertension   [place] [facility], Raguel, MD               insulin  glargine (LANTUS  SOLOSTAR) 100 UNIT/ML Solostar Pen 15 mL 2    Sig: Inject 35 Units into the skin daily.     Endocrinology:  Diabetes - Insulins Failed - 06/11/2024  2:28 PM      Failed - HBA1C is between 0 and 7.9 and within 180 days    HbA1c, POC (controlled diabetic range)  Date Value Ref Range Status  03/19/2024 10.1 (A) 0.0 - 7.0 % Final         Passed - Valid encounter within last 6 months    Recent Outpatient Visits           2 months ago Type 2 diabetes mellitus with hyperglycemia, with long-term current use of insulin  [facility])   [place] [facility] - A Dept Of [place]. [facility] Fleeta Morris, Garnette CROME, RPH-CPP   5 months ago Long term (current) use of insulin  [facility])   [place] [facility] [place] - A Dept Of . [facility] Fleeta Morris Garnette L, RPH-CPP   6 months ago Type 2 diabetes mellitus with hyperglycemia, with long-term current use of insulin  [facility])   [place] [facility], MD   9 months ago Uncontrolled hypertension   [place] [facility], MD   1 year ago Uncontrolled hypertension   [place] [facility], Raguel, MD               amLODipine  (NORVASC ) 10 MG tablet 90 tablet 1    Sig: Take 1 tablet (10 mg total) by mouth daily.     Cardiovascular: Calcium  Channel Blockers 2 Failed - 06/11/2024  2:28 PM      Failed - Last BP in normal range    BP Readings from Last 1 Encounters:  12/05/23 (!) 147/86  Passed - Last Heart Rate in normal range    Pulse Readings from Last 1 Encounters:  12/05/23 70         Passed - Valid encounter within last 6 months    Recent Outpatient Visits           2 months ago Type 2 diabetes mellitus with hyperglycemia, with long-term current use of insulin  (HCC)   [place] [facility] - A Dept Of [place]. [facility] Fleeta Morris, Garnette CROME, RPH-CPP   5 months ago Long term (current) use of insulin  [facility])   [place] [facility] [place] - A Dept Of [place]. [facility] Fleeta Morris Garnette L, RPH-CPP   6 months ago Type 2 diabetes mellitus with hyperglycemia, with long-term current use of insulin  [facility])   [place] [facility], MD   9 months ago Uncontrolled hypertension   [place] [facility] at [facility], MD   1 year ago Uncontrolled hypertension    Primary Care at [facility], Raguel, MD               atorvastatin  (LIPITOR ) 80 MG tablet 90 tablet 1    Sig:  Take 1 tablet (80 mg total) by mouth daily.     Cardiovascular:  Antilipid - Statins Failed - 06/11/2024  2:28 PM      Failed - Lipid Panel in normal range within the last 12 months    Cholesterol, Total  Date Value Ref Range Status  05/31/2019 322 (H) 100 - 199 mg/dL Final   Cholesterol  Date Value Ref Range Status  06/21/2021 313 (H) 0 - 200 mg/dL Final   LDL Chol Calc (NIH)  Date Value Ref Range Status  05/31/2019 196 (H) 0 - 99 mg/dL Final   LDL Cholesterol  Date Value Ref Range Status  06/21/2021 235 (H) 0 - 99 mg/dL Final    Comment:           Total Cholesterol/HDL:CHD Risk Coronary Heart Disease Risk Table                     Men   Women  1/2 Average Risk   3.4   3.3  Average Risk       5.0   4.4  2 X Average Risk   9.6   7.1  3 X Average Risk  23.4   11.0        Use the calculated Patient Ratio above and the CHD Risk Table to determine the patient's CHD Risk.        ATP III CLASSIFICATION (LDL):  <100     mg/dL   Optimal  899-870  mg/dL   Near or Above                    Optimal  130-159  mg/dL   Borderline  839-810  mg/dL   High  >809     mg/dL   Very High Performed at [facility], [address], [place], [place] [address]    LDL Direct  Date Value Ref Range Status  09/01/2019 137 (H) 0 - 99 mg/dL Final   HDL  Date Value Ref Range Status  06/21/2021 32 (L) >40 mg/dL Final  98/81/7978 38 (L) >39 mg/dL Final   Triglycerides  Date Value Ref Range Status  05/21/2023 257 (H) <150 mg/dL Final  Comment:    Performed at [facility], [address] [place], [place] [address]         Passed - Patient is not pregnant      Passed - Valid encounter within last 12 months    Recent Outpatient Visits           2 months ago Type 2 diabetes mellitus with hyperglycemia, with long-term current use of insulin  [facility])   [place] [facility] [place] - A Dept Of [place]. [facility] Fleeta Morris, Garnette CROME, RPH-CPP   5  months ago Long term (current) use of insulin  [facility])   [place] [facility] [place] [facility] - A Dept Of [place]. [facility] Fleeta Morris Garnette CROME, RPH-CPP   6 months ago Type 2 diabetes mellitus with hyperglycemia, with long-term current use of insulin  [facility])   [place] [facility], MD   9 months ago Uncontrolled hypertension   [place] [facility], MD   1 year ago Uncontrolled hypertension   [place] [facility] at [facility], Raguel, MD               carvedilol  (COREG ) 25 MG tablet 180 tablet 1    Sig: Take 1 tablet (25 mg total) by mouth 2 (two) times daily with a meal.     Cardiovascular: Beta Blockers 3 Failed - 06/11/2024  2:28 PM      Failed - Cr in normal range and within 360 days    Creatinine, Ser  Date Value Ref Range Status  05/25/2023 2.03 (H) 0.61 - 1.24 mg/dL Final   Creatinine, Urine  Date Value Ref Range Status  04/02/2021 35.49 mg/dL Final    Comment:    Performed at [facility], [address], [place], [place] [address]         Failed - AST in normal range and within 360 days    AST  Date Value Ref Range Status  05/25/2023 23 15 - 41 U/L Final         Failed - ALT in normal range and within 360 days    ALT  Date Value Ref Range Status  05/25/2023 16 0 - 44 U/L Final         Failed - Last BP in normal range    BP Readings from Last 1 Encounters:  12/05/23 (!) 147/86         Passed - Last Heart Rate in normal range    Pulse Readings from Last 1 Encounters:  12/05/23 70         Passed - Valid encounter within last 6 months    Recent Outpatient Visits           2 months ago Type 2 diabetes mellitus with hyperglycemia, with long-term current use of insulin  (HCC)   [place] [facility] - A Dept Of [place]. [facility] Fleeta Morris, Garnette CROME, RPH-CPP   5 months ago Long term (current) use of insulin  [facility])   [place]  [facility] [place] - A Dept Of [place]. [facility] Fleeta Morris Garnette L, RPH-CPP   6 months ago Type 2 diabetes mellitus with hyperglycemia, with long-term current use of insulin  [facility])   [place] [facility], MD   9 months ago Uncontrolled hypertension   [place] [facility]  [place] Tanda Bleacher, MD   1 year ago Uncontrolled hypertension   [place] [facility], Bleacher, MD               lisinopril  (ZESTRIL ) 20 MG tablet 90 tablet 0    Sig: Take 1 tablet (20 mg total) by mouth daily.     Cardiovascular:  ACE Inhibitors Failed - 06/11/2024  2:28 PM      Failed - Cr in normal range and within 180 days    Creatinine, Ser  Date Value Ref Range Status  05/25/2023 2.03 (H) 0.61 - 1.24 mg/dL Final   Creatinine, Urine  Date Value Ref Range Status  04/02/2021 35.49 mg/dL Final    Comment:    Performed at [facility], [address] [place], [place] [address]         Failed - K in normal range and within 180 days    Potassium  Date Value Ref Range Status  05/25/2023 5.1 3.5 - 5.1 mmol/L Final         Failed - Last BP in normal range    BP Readings from Last 1 Encounters:  12/05/23 (!) 147/86         Passed - Patient is not pregnant      Passed - Valid encounter within last 6 months    Recent Outpatient Visits           2 months ago Type 2 diabetes mellitus with hyperglycemia, with long-term current use of insulin  (HCC)   [place] [facility] - A Dept Of [place]. [facility] Fleeta Morris, Garnette CROME, RPH-CPP   5 months ago Long term (current) use of insulin  [facility])   [place] [facility] [place] - A Dept Of [place]. [facility] Fleeta Morris Garnette L, RPH-CPP   6 months ago Type 2 diabetes mellitus with hyperglycemia, with long-term current use of insulin  [facility])   [place] [facility], MD   9 months ago  Uncontrolled hypertension   [place] [facility] at [facility], MD   1 year ago Uncontrolled hypertension   [place] [facility], Bleacher, MD               hydrALAZINE  (APRESOLINE ) 100 MG tablet 180 tablet 1    Sig: Take 1 tablet (100 mg total) by mouth 2 (two) times daily.     Cardiovascular:  Vasodilators Failed - 06/11/2024  2:28 PM      Failed - HCT in normal range and within 360 days    HCT  Date Value Ref Range Status  05/25/2023 35.4 (L) 39.0 - 52.0 % Final   Hematocrit  Date Value Ref Range Status  08/27/2019 38.7 37.5 - 51.0 % Final         Failed - HGB in normal range and within 360 days    Hemoglobin  Date Value Ref Range Status  05/25/2023 11.3 (L) 13.0 - 17.0 g/dL Final         Failed - RBC in normal range and within 360 days    RBC  Date Value Ref Range Status  05/25/2023 4.12 (L) 4.22 - 5.81 MIL/uL Final         Failed - WBC in normal range and within 360 days    WBC  Date Value Ref Range Status  05/25/2023 5.4 4.0 - 10.5 K/uL Final  Failed - PLT in normal range and within 360 days    Platelets  Date Value Ref Range Status  05/25/2023 236 150 - 400 K/uL Final         Failed - ANA Screen, Ifa, Serum in normal range and within 360 days    No results found for: ANA, ANATITER, LABANTI       Failed - Last BP in normal range    BP Readings from Last 1 Encounters:  12/05/23 (!) 147/86         Passed - Valid encounter within last 12 months    Recent Outpatient Visits           2 months ago Type 2 diabetes mellitus with hyperglycemia, with long-term current use of insulin  (HCC)   [place] [facility] - A Dept Of [place]. [facility] Fleeta Morris, Garnette CROME, RPH-CPP   5 months ago Long term (current) use of insulin  [facility])   [place] [facility] [place] - A Dept Of [place]. [facility] Fleeta Morris Garnette L, RPH-CPP   6 months ago Type 2 diabetes  mellitus with hyperglycemia, with long-term current use of insulin  [facility])   [place] [facility], MD   9 months ago Uncontrolled hypertension   [place] [facility], MD   1 year ago Uncontrolled hypertension    Primary [facility] at [facility], MD

## 2024-06-11 NOTE — Telephone Encounter (Signed)
 Refilled 03/16/24 # 90 with 1 refill.  Requested Prescriptions  Signed Prescriptions Disp Refills   insulin  aspart (NOVOLOG ) 100 UNIT/ML FlexPen 15 mL 2    Sig: Inject 10 Units into the skin 3 (three) times daily with meals.     Endocrinology:  Diabetes - Insulins Failed - 06/11/2024  2:31 PM      Failed - HBA1C is between 0 and 7.9 and within 180 days    HbA1c, POC (controlled diabetic range)  Date Value Ref Range Status  03/19/2024 10.1 (A) 0.0 - 7.0 % Final         Passed - Valid encounter within last 6 months    Recent Outpatient Visits           2 months ago Type 2 diabetes mellitus with hyperglycemia, with long-term current use of insulin  [facility])   [facility] Dept Of [place]. [facility] Fleeta Morris, Garnette CROME, RPH-CPP   5 months ago Long term (current) use of insulin  [facility])   [facility] [place] - [facility] Dept Of [place]. [facility] Fleeta Morris Garnette L, RPH-CPP   6 months ago Type 2 diabetes mellitus with hyperglycemia, with long-term current use of insulin  [facility])   [facility], MD   9 months ago Uncontrolled hypertension   [place] [facility], MD   1 year ago Uncontrolled hypertension   [place] [facility], Raguel, MD               insulin  glargine (LANTUS  SOLOSTAR) 100 UNIT/ML Solostar Pen 15 mL 2    Sig: Inject 35 Units into the skin daily.     Endocrinology:  Diabetes - Insulins Failed - 06/11/2024  2:31 PM      Failed - HBA1C is between 0 and 7.9 and within 180 days    HbA1c, POC (controlled diabetic range)  Date Value Ref Range Status  03/19/2024 10.1 (A) 0.0 - 7.0 % Final         Passed - Valid encounter within last 6 months    Recent Outpatient Visits           2 months ago Type 2 diabetes mellitus with hyperglycemia, with long-term current use of insulin  (HCC)   [facility] - [facility] Dept Of [place]. [facility] Fleeta Morris, Garnette CROME, RPH-CPP   5 months ago Long term (current) use of insulin  [facility])   [facility] [place] - [facility] Dept Of . [facility] Fleeta Morris Garnette L, RPH-CPP   6 months ago Type 2 diabetes mellitus with hyperglycemia, with long-term current use of insulin  [facility])   [facility] [facility], MD   9 months ago Uncontrolled hypertension   [place] [facility], MD   1 year ago Uncontrolled hypertension   [place] [facility], Raguel, MD               atorvastatin  (LIPITOR ) 80 MG tablet 90 tablet 1    Sig: Take 1 tablet (80 mg total) by mouth daily.     Cardiovascular:  Antilipid - Statins Failed - 06/11/2024  2:31 PM      Failed - Lipid Panel in normal range within the last 12 months    Cholesterol,  Total  Date Value Ref Range Status  05/31/2019 322 (H) 100 - 199 mg/dL Final   Cholesterol  Date Value Ref Range Status  06/21/2021 313 (H) 0 - 200 mg/dL Final   LDL Chol Calc (NIH)  Date Value Ref Range Status  05/31/2019 196 (H) 0 - 99 mg/dL Final   LDL Cholesterol  Date Value Ref Range Status  06/21/2021 235 (H) 0 - 99 mg/dL Final    Comment:           Total Cholesterol/HDL:CHD Risk Coronary Heart Disease Risk Table                     Men   Women  1/2 Average Risk   3.4   3.3  Average Risk       5.0   4.4  2 X Average Risk   9.6   7.1  3 X Average Risk  23.4   11.0        Use the calculated Patient Ratio above and the CHD Risk Table to determine the patient's CHD Risk.        ATP III CLASSIFICATION (LDL):  <100     mg/dL   Optimal  899-870  mg/dL   Near or Above                    Optimal  130-159  mg/dL   Borderline  839-810  mg/dL   High  >809     mg/dL   Very High Performed at [facility], [address], [place], [place] [address]    LDL Direct  Date Value Ref  Range Status  09/01/2019 137 (H) 0 - 99 mg/dL Final   HDL  Date Value Ref Range Status  06/21/2021 32 (L) >40 mg/dL Final  98/81/7978 38 (L) >39 mg/dL Final   Triglycerides  Date Value Ref Range Status  05/21/2023 257 (H) <150 mg/dL Final    Comment:    Performed at [facility], [address], [place], [place] [address]         Passed - Patient is not pregnant      Passed - Valid encounter within last 12 months    Recent Outpatient Visits           2 months ago Type 2 diabetes mellitus with hyperglycemia, with long-term current use of insulin  [facility])   [facility] [place] - [facility] Dept Of [place]. [facility] Fleeta Morris, Garnette CROME, RPH-CPP   5 months ago Long term (current) use of insulin  [facility])   [facility] [place] - [facility] Dept Of [place]. [facility] Fleeta Morris, [place] L, RPH-CPP   6 months ago Type 2 diabetes mellitus with hyperglycemia, with long-term current use of insulin  [facility])   [facility] [place] [facility], MD   9 months ago Uncontrolled hypertension    [facility] at [facility], MD   1 year ago Uncontrolled hypertension    [facility] at [facility], Raguel, MD               carvedilol  (COREG ) 25 MG tablet 180 tablet 1    Sig: Take 1 tablet (25 mg total) by mouth 2 (two) times daily with a meal.     Cardiovascular: Beta Blockers 3 Failed - 06/11/2024  2:31 PM      Failed - Cr  in normal range and within 360 days    Creatinine, Ser  Date Value Ref Range Status  05/25/2023 2.03 (H) 0.61 - 1.24 mg/dL Final   Creatinine, Urine  Date Value Ref Range Status  04/02/2021 35.49 mg/dL Final    Comment:    Performed at [facility], [address], [place], [place] [address]         Failed - AST in normal range and within 360 days    AST  Date Value Ref Range Status  05/25/2023 23 15 - 41 U/L Final         Failed -  ALT in normal range and within 360 days    ALT  Date Value Ref Range Status  05/25/2023 16 0 - 44 U/L Final         Failed - Last BP in normal range    BP Readings from Last 1 Encounters:  12/05/23 (!) 147/86         Passed - Last Heart Rate in normal range    Pulse Readings from Last 1 Encounters:  12/05/23 70         Passed - Valid encounter within last 6 months    Recent Outpatient Visits           2 months ago Type 2 diabetes mellitus with hyperglycemia, with long-term current use of insulin  (HCC)   [facility] [place] - [facility] Dept Of [place]. [facility] Fleeta Morris, Garnette CROME, RPH-CPP   5 months ago Long term (current) use of insulin  [facility])   [facility] - [facility] Dept Of [place]. [facility] Fleeta Morris Garnette L, RPH-CPP   6 months ago Type 2 diabetes mellitus with hyperglycemia, with long-term current use of insulin  [facility])   [facility] [facility], MD   9 months ago Uncontrolled hypertension   [place] [facility], MD   1 year ago Uncontrolled hypertension   [place] [facility], Raguel, MD               lisinopril  (ZESTRIL ) 20 MG tablet 90 tablet 0    Sig: Take 1 tablet (20 mg total) by mouth daily.     Cardiovascular:  ACE Inhibitors Failed - 06/11/2024  2:31 PM      Failed - Cr in normal range and within 180 days    Creatinine, Ser  Date Value Ref Range Status  05/25/2023 2.03 (H) 0.61 - 1.24 mg/dL Final   Creatinine, Urine  Date Value Ref Range Status  04/02/2021 35.49 mg/dL Final    Comment:    Performed at [facility], [address], [place], [place] [address]         Failed - K in normal range and within 180 days    Potassium  Date Value Ref Range Status  05/25/2023 5.1 3.5 - 5.1 mmol/L Final         Failed - Last BP in normal range    BP Readings from Last 1 Encounters:  12/05/23 (!) 147/86          Passed - Patient is not pregnant      Passed - Valid encounter within last 6 months    Recent Outpatient Visits           2 months ago Type 2 diabetes mellitus with hyperglycemia, with long-term current  use of insulin  [facility])   [facility] Dept Of [place]. [facility] Fleeta Morris, Garnette CROME, RPH-CPP   5 months ago Long term (current) use of insulin  [facility])   [facility] - A Dept Of [place]. [facility] Fleeta Morris Garnette L, RPH-CPP   6 months ago Type 2 diabetes mellitus with hyperglycemia, with long-term current use of insulin  [facility])   [place] [facility], MD   9 months ago Uncontrolled hypertension   [place] [facility], Raguel, MD   1 year ago Uncontrolled hypertension   [place] [facility], Raguel, MD               hydrALAZINE  (APRESOLINE ) 100 MG tablet 180 tablet 1    Sig: Take 1 tablet (100 mg total) by mouth 2 (two) times daily.     Cardiovascular:  Vasodilators Failed - 06/11/2024  2:31 PM      Failed - HCT in normal range and within 360 days    HCT  Date Value Ref Range Status  05/25/2023 35.4 (L) 39.0 - 52.0 % Final   Hematocrit  Date Value Ref Range Status  08/27/2019 38.7 37.5 - 51.0 % Final         Failed - HGB in normal range and within 360 days    Hemoglobin  Date Value Ref Range Status  05/25/2023 11.3 (L) 13.0 - 17.0 g/dL Final         Failed - RBC in normal range and within 360 days    RBC  Date Value Ref Range Status  05/25/2023 4.12 (L) 4.22 - 5.81 MIL/uL Final         Failed - WBC in normal range and within 360 days    WBC  Date Value Ref Range Status  05/25/2023 5.4 4.0 - 10.5 K/uL Final         Failed - PLT in normal range and within 360 days    Platelets  Date Value Ref Range Status  05/25/2023 236 150 - 400 K/uL Final         Failed - ANA Screen, Ifa, Serum in  normal range and within 360 days    No results found for: ANA, ANATITER, LABANTI       Failed - Last BP in normal range    BP Readings from Last 1 Encounters:  12/05/23 (!) 147/86         Passed - Valid encounter within last 12 months    Recent Outpatient Visits           2 months ago Type 2 diabetes mellitus with hyperglycemia, with long-term current use of insulin  (HCC)   [facility] Dept Of [place]. [facility] Fleeta Morris, Garnette CROME, RPH-CPP   5 months ago Long term (current) use of insulin  [name])   [facility] [place] - [facility] Dept Of [place]. [facility] Fleeta Morris Garnette L, RPH-CPP   6 months ago Type 2 diabetes mellitus with hyperglycemia, with long-term current use of insulin  [facility])   [place] [facility], MD   9 months ago Uncontrolled hypertension   [place] [facility], MD   1 year ago Uncontrolled hypertension    [facility] at [facility],  Raguel, MD              Refused Prescriptions Disp Refills   amLODipine  (NORVASC ) 10 MG tablet 90 tablet 1    Sig: Take 1 tablet (10 mg total) by mouth daily.     Cardiovascular: Calcium  Channel Blockers 2 Failed - 06/11/2024  2:31 PM      Failed - Last BP in normal range    BP Readings from Last 1 Encounters:  12/05/23 (!) 147/86         Passed - Last Heart Rate in normal range    Pulse Readings from Last 1 Encounters:  12/05/23 70         Passed - Valid encounter within last 6 months    Recent Outpatient Visits           2 months ago Type 2 diabetes mellitus with hyperglycemia, with long-term current use of insulin  (HCC)   [facility] [facility] Dept Of [place]. [facility] Fleeta Morris, Garnette CROME, RPH-CPP   5 months ago Long term (current) use of insulin  [facility])   [facility] [facility] [place] - [facility] Dept Of [place]. [facility]  Fleeta Morris Garnette L, RPH-CPP   6 months ago Type 2 diabetes mellitus with hyperglycemia, with long-term current use of insulin  [facility])   [place] [facility], MD   9 months ago Uncontrolled hypertension   [place] [facility], MD   1 year ago Uncontrolled hypertension   [place] [facility], MD
